# Patient Record
Sex: Male | Born: 1955 | Race: Black or African American | Hispanic: No | Smoking: Former smoker
Health system: Southern US, Community
[De-identification: ages and names within clinical notes are randomized; demographics above are authoritative.]

## PROBLEM LIST (undated history)

## (undated) DIAGNOSIS — F329 Major depressive disorder, single episode, unspecified: Secondary | ICD-10-CM

## (undated) DIAGNOSIS — M1990 Unspecified osteoarthritis, unspecified site: Secondary | ICD-10-CM

## (undated) DIAGNOSIS — J189 Pneumonia, unspecified organism: Secondary | ICD-10-CM

## (undated) DIAGNOSIS — M545 Other chronic pain: Secondary | ICD-10-CM

## (undated) DIAGNOSIS — R519 Headache, unspecified: Secondary | ICD-10-CM

## (undated) DIAGNOSIS — I700 Atherosclerosis of aorta: Secondary | ICD-10-CM

## (undated) DIAGNOSIS — R350 Frequency of micturition: Secondary | ICD-10-CM

## (undated) DIAGNOSIS — R109 Unspecified abdominal pain: Secondary | ICD-10-CM

## (undated) DIAGNOSIS — R001 Bradycardia, unspecified: Secondary | ICD-10-CM

## (undated) DIAGNOSIS — Z9289 Personal history of other medical treatment: Secondary | ICD-10-CM

## (undated) DIAGNOSIS — R9439 Abnormal result of other cardiovascular function study: Secondary | ICD-10-CM

## (undated) DIAGNOSIS — E785 Hyperlipidemia, unspecified: Secondary | ICD-10-CM

## (undated) DIAGNOSIS — M549 Dorsalgia, unspecified: Secondary | ICD-10-CM

## (undated) DIAGNOSIS — R0600 Dyspnea, unspecified: Secondary | ICD-10-CM

## (undated) DIAGNOSIS — K219 Gastro-esophageal reflux disease without esophagitis: Secondary | ICD-10-CM

## (undated) DIAGNOSIS — G4700 Insomnia, unspecified: Secondary | ICD-10-CM

## (undated) DIAGNOSIS — X58XXXA Exposure to other specified factors, initial encounter: Secondary | ICD-10-CM

## (undated) DIAGNOSIS — G8929 Other chronic pain: Secondary | ICD-10-CM

## (undated) DIAGNOSIS — F1490 Cocaine use, unspecified, uncomplicated: Secondary | ICD-10-CM

## (undated) DIAGNOSIS — I219 Acute myocardial infarction, unspecified: Secondary | ICD-10-CM

## (undated) DIAGNOSIS — I2510 Atherosclerotic heart disease of native coronary artery without angina pectoris: Secondary | ICD-10-CM

## (undated) DIAGNOSIS — R42 Dizziness and giddiness: Secondary | ICD-10-CM

## (undated) DIAGNOSIS — C3490 Malignant neoplasm of unspecified part of unspecified bronchus or lung: Secondary | ICD-10-CM

## (undated) DIAGNOSIS — F32A Depression, unspecified: Secondary | ICD-10-CM

## (undated) DIAGNOSIS — Z720 Tobacco use: Secondary | ICD-10-CM

## (undated) DIAGNOSIS — Z973 Presence of spectacles and contact lenses: Secondary | ICD-10-CM

## (undated) DIAGNOSIS — R51 Headache: Principal | ICD-10-CM

## (undated) DIAGNOSIS — K852 Alcohol induced acute pancreatitis without necrosis or infection: Secondary | ICD-10-CM

## (undated) DIAGNOSIS — F419 Anxiety disorder, unspecified: Secondary | ICD-10-CM

## (undated) HISTORY — PX: SURGERY SCROTAL / TESTICULAR: SUR1316

## (undated) HISTORY — PX: FRACTURE SURGERY: SHX138

## (undated) HISTORY — DX: Atherosclerosis of aorta: I70.0

## (undated) HISTORY — DX: Headache: R51

## (undated) HISTORY — PX: LUNG SURGERY: SHX703

## (undated) HISTORY — DX: Other chronic pain: G89.29

## (undated) HISTORY — DX: Atherosclerotic heart disease of native coronary artery without angina pectoris: I25.10

## (undated) HISTORY — DX: Bradycardia, unspecified: R00.1

## (undated) HISTORY — DX: Headache, unspecified: R51.9

## (undated) HISTORY — DX: Tobacco use: Z72.0

## (undated) HISTORY — DX: Personal history of other medical treatment: Z92.89

---

## 1978-10-05 HISTORY — PX: POSTERIOR CERVICAL FUSION/FORAMINOTOMY: SHX5038

## 2004-01-25 ENCOUNTER — Emergency Department: Payer: Self-pay | Admitting: Emergency Medicine

## 2004-02-14 ENCOUNTER — Emergency Department: Payer: Self-pay | Admitting: Emergency Medicine

## 2004-03-26 ENCOUNTER — Other Ambulatory Visit: Payer: Self-pay

## 2004-03-26 ENCOUNTER — Emergency Department: Payer: Self-pay | Admitting: General Practice

## 2004-04-01 ENCOUNTER — Ambulatory Visit: Payer: Self-pay | Admitting: Family Medicine

## 2004-09-09 ENCOUNTER — Emergency Department: Payer: Self-pay | Admitting: Emergency Medicine

## 2004-12-21 ENCOUNTER — Emergency Department: Payer: Self-pay | Admitting: Emergency Medicine

## 2005-03-22 ENCOUNTER — Emergency Department: Payer: Self-pay | Admitting: Emergency Medicine

## 2005-03-31 ENCOUNTER — Emergency Department: Payer: Self-pay | Admitting: Emergency Medicine

## 2005-11-12 ENCOUNTER — Emergency Department: Payer: Self-pay | Admitting: Emergency Medicine

## 2006-03-18 ENCOUNTER — Emergency Department: Payer: Self-pay

## 2006-06-07 ENCOUNTER — Emergency Department: Payer: Self-pay | Admitting: General Practice

## 2006-07-03 ENCOUNTER — Emergency Department: Payer: Self-pay | Admitting: Emergency Medicine

## 2006-08-16 ENCOUNTER — Emergency Department: Payer: Self-pay | Admitting: Emergency Medicine

## 2006-12-12 ENCOUNTER — Emergency Department: Payer: Self-pay | Admitting: Emergency Medicine

## 2007-11-04 ENCOUNTER — Other Ambulatory Visit: Payer: Self-pay

## 2007-11-04 ENCOUNTER — Emergency Department: Payer: Self-pay | Admitting: Emergency Medicine

## 2009-04-11 ENCOUNTER — Emergency Department: Payer: Self-pay | Admitting: Emergency Medicine

## 2009-08-07 ENCOUNTER — Inpatient Hospital Stay: Payer: Self-pay | Admitting: Internal Medicine

## 2011-04-08 ENCOUNTER — Encounter: Payer: Self-pay | Admitting: Emergency Medicine

## 2011-04-08 ENCOUNTER — Ambulatory Visit (INDEPENDENT_AMBULATORY_CARE_PROVIDER_SITE_OTHER): Payer: Self-pay | Admitting: Emergency Medicine

## 2011-04-08 DIAGNOSIS — R9389 Abnormal findings on diagnostic imaging of other specified body structures: Secondary | ICD-10-CM

## 2011-04-08 DIAGNOSIS — I499 Cardiac arrhythmia, unspecified: Secondary | ICD-10-CM

## 2011-04-08 DIAGNOSIS — R51 Headache: Secondary | ICD-10-CM

## 2011-04-08 DIAGNOSIS — R519 Headache, unspecified: Secondary | ICD-10-CM | POA: Insufficient documentation

## 2011-04-08 DIAGNOSIS — J4489 Other specified chronic obstructive pulmonary disease: Secondary | ICD-10-CM

## 2011-04-08 DIAGNOSIS — R918 Other nonspecific abnormal finding of lung field: Secondary | ICD-10-CM | POA: Insufficient documentation

## 2011-04-08 DIAGNOSIS — J439 Emphysema, unspecified: Secondary | ICD-10-CM | POA: Insufficient documentation

## 2011-04-08 DIAGNOSIS — J449 Chronic obstructive pulmonary disease, unspecified: Secondary | ICD-10-CM

## 2011-04-08 NOTE — Assessment & Plan Note (Signed)
Will concentrate on lung nodule now, then address the tobacco and COPD needs.

## 2011-04-08 NOTE — Assessment & Plan Note (Signed)
[name] nodule worrisome for CA. Scan was done in 11/12, will repeat CT and do superD cuts for possible ENB. Then discuss case and plan for either ENB or needle bx. I will call him with the plan.
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
## 2011-04-08 NOTE — Patient Instructions (Signed)
We need to repeat your CT scan of the chest to compare with your prior and to plan your biopsy.  Dr Delton Coombes will call you with the next steps.

## 2011-04-09 ENCOUNTER — Ambulatory Visit (INDEPENDENT_AMBULATORY_CARE_PROVIDER_SITE_OTHER)
Admission: RE | Admit: 2011-04-09 | Discharge: 2011-04-09 | Disposition: A | Discharge status: Home / Self Care | Payer: Self-pay | Source: Ambulatory Visit | Attending: Emergency Medicine | Admitting: Emergency Medicine

## 2011-04-09 DIAGNOSIS — R9389 Abnormal findings on diagnostic imaging of other specified body structures: Secondary | ICD-10-CM

## 2011-04-28 ENCOUNTER — Telehealth: Payer: Self-pay | Admitting: *Deleted

## 2011-04-28 DIAGNOSIS — R918 Other nonspecific abnormal finding of lung field: Secondary | ICD-10-CM

## 2011-04-28 DIAGNOSIS — R9389 Abnormal findings on diagnostic imaging of other specified body structures: Secondary | ICD-10-CM

## 2011-04-28 NOTE — Telephone Encounter (Signed)
Unable to contact pt at either phone number we have in the system. They are both out of service. Pt did not have a permanent residence when he was here for OV with Dr. Delton Coombes but confirmed his phone numbers. We may need to send him a letter if unable to reach him by phone.

## 2011-04-28 NOTE — Telephone Encounter (Signed)
Message copied by Gwynneth Albright on Mon Apr 28, 2011  8:39 AM ------      Message from: Leslye Peer      Created: Fri Apr 18, 2011  4:21 PM       Please call MR [facility] and tell him that we need to set a PET scan, then refer him to [facility] for possible surgery on his lung mass. Thanks

## 2011-05-07 NOTE — Telephone Encounter (Signed)
Pt is scheduled for PET scan on 05/08/11 and referral to thoracic surgeon sent to [facility].

## 2011-05-08 ENCOUNTER — Inpatient Hospital Stay (HOSPITAL_COMMUNITY): Admission: RE | Admit: 2011-05-08 | Payer: Self-pay | Source: Ambulatory Visit

## 2011-05-09 NOTE — Telephone Encounter (Signed)
Pt needs to be NPO after midnight. No sugar, candy or gum. Bring your morning medications with you in the original bottles. Pt will be prepped for procedure, IV will be started and nuclear medication administered. He will need to wait for approx 45 minutes after the nuclear medication is given and the scan itself is about 1 hour. Pt will need to plan on being there 2 to 2 1/2 hours total. I spoke with the pt and he is aware of all of this and verbalized understanding. When asked if he had any additional questions he said no. Will forward back to RB so he is aware.

## 2011-05-09 NOTE — Telephone Encounter (Signed)
Referral faxed to [facility] for his appt there

## 2011-05-09 NOTE — Telephone Encounter (Signed)
Almyra Free has scheduled for the pt to see Dr. Laneta Simmers on Tues., 05/13/11 @ 9am. Pt should arrive at 8:45am. Address is:  [address]. Phone is [phone]. Pt is aware of the appt but did not have pen or paper to write the information down. He promised to call me back on Mon., 05/12/11 to get the information.

## 2011-05-09 NOTE — Telephone Encounter (Signed)
We were unable to reach pt for PET scan on 05/08/11. He did callback today and is aware we are trying to set this up for him. Pt could not stay on the phone because he has a "minute" phone. He said we may reach him at [phone] to let him know when this has been rescheduled.   PET scan is now scheduled for PET scan @ [facility] on 05/16/11 @ 9:15am. Pt has been notified of this and is requesting additional information about the scan procedure itself. Pt also aware that he may be scheduled to see a thoracic surgeon as well. I will speak with Almyra Free and see what information we have regarding PET procedure and we will call the pt back.

## 2011-05-12 ENCOUNTER — Telehealth: Payer: Self-pay | Admitting: Emergency Medicine

## 2011-05-12 NOTE — Telephone Encounter (Signed)
Pt given info regarding appt with Dr Laneta Simmers.

## 2011-05-13 ENCOUNTER — Encounter: Payer: Self-pay | Admitting: Surgery

## 2011-05-13 ENCOUNTER — Other Ambulatory Visit: Payer: Self-pay | Admitting: Surgery

## 2011-05-13 ENCOUNTER — Institutional Professional Consult (permissible substitution) (INDEPENDENT_AMBULATORY_CARE_PROVIDER_SITE_OTHER): Payer: Self-pay | Admitting: Surgery

## 2011-05-13 VITALS — BP 97/72 | HR 52 | Temp 97.6°F | Resp 16 | Ht 68.0 in | Wt 128.0 lb

## 2011-05-13 DIAGNOSIS — R222 Localized swelling, mass and lump, trunk: Secondary | ICD-10-CM

## 2011-05-13 DIAGNOSIS — D381 Neoplasm of uncertain behavior of trachea, bronchus and lung: Secondary | ICD-10-CM

## 2011-05-13 DIAGNOSIS — R918 Other nonspecific abnormal finding of lung field: Secondary | ICD-10-CM | POA: Insufficient documentation
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
## 2011-05-15 ENCOUNTER — Ambulatory Visit (HOSPITAL_COMMUNITY)
Admission: RE | Admit: 2011-05-15 | Discharge: 2011-05-15 | Disposition: A | Discharge status: Home / Self Care | Payer: Self-pay | Source: Ambulatory Visit | Attending: Surgery | Admitting: Surgery

## 2011-05-15 DIAGNOSIS — D381 Neoplasm of uncertain behavior of trachea, bronchus and lung: Secondary | ICD-10-CM

## 2011-05-15 DIAGNOSIS — J984 Other disorders of lung: Secondary | ICD-10-CM | POA: Insufficient documentation

## 2011-05-15 LAB — PULMONARY FUNCTION TEST

## 2011-05-16 ENCOUNTER — Other Ambulatory Visit (HOSPITAL_COMMUNITY): Payer: Self-pay

## 2011-05-21 ENCOUNTER — Institutional Professional Consult (permissible substitution): Payer: Self-pay | Admitting: Cardiovascular Disease

## 2011-05-27 ENCOUNTER — Encounter: Payer: Self-pay | Admitting: Surgery

## 2011-05-27 ENCOUNTER — Encounter (HOSPITAL_COMMUNITY)
Admission: RE | Admit: 2011-05-27 | Discharge: 2011-05-27 | Disposition: A | Discharge status: Home / Self Care | Payer: Medicaid Other | Source: Ambulatory Visit | Attending: Emergency Medicine | Admitting: Emergency Medicine

## 2011-05-27 ENCOUNTER — Ambulatory Visit (INDEPENDENT_AMBULATORY_CARE_PROVIDER_SITE_OTHER): Payer: Self-pay | Admitting: Surgery

## 2011-05-27 VITALS — BP 113/75 | HR 57 | Temp 97.9°F | Resp 16 | Ht 68.0 in | Wt 128.0 lb

## 2011-05-27 DIAGNOSIS — R911 Solitary pulmonary nodule: Secondary | ICD-10-CM | POA: Insufficient documentation

## 2011-05-27 DIAGNOSIS — R918 Other nonspecific abnormal finding of lung field: Secondary | ICD-10-CM

## 2011-05-27 DIAGNOSIS — R9389 Abnormal findings on diagnostic imaging of other specified body structures: Secondary | ICD-10-CM

## 2011-05-27 DIAGNOSIS — R222 Localized swelling, mass and lump, trunk: Secondary | ICD-10-CM

## 2011-05-27 LAB — GLUCOSE, CAPILLARY: Glucose-Capillary: 90 mg/dL (ref 70–99)

## 2011-05-27 MED ORDER — FLUDEOXYGLUCOSE F - 18 (FDG) INJECTION
18.9000 | Freq: Once | INTRAVENOUS | Status: AC | PRN
  Administered 2011-05-27: 18.9 via INTRAVENOUS

## 2011-05-27 NOTE — Progress Notes (Signed)
[address]            [place] [address]          [phone]      HPI:  The patient returns today for discussion of his PET scan results and pulmonary function testing. He missed his appointment with cardiology but that has been rescheduled for tomorrow. His PET scan showed hypermetabolic uptake within the peripheral left upper lobe lung mass. There is a small area of hypermetabolic uptake within the pelvis which I don't think is related to the lung mass. There are no other areas of hypermetabolic activity within the chest, abdomen, or bones.  His pelvic function testing shows an FEV1 of 2.53 which was 81% of predicted. FVC was 3.86 which is 97% predicted. His diffusion capacity was markedly reduced at 31% predicted.   Current Outpatient Prescriptions  Medication Sig Dispense Refill  . buPROPion (WELLBUTRIN SR) 100 MG 12 hr tablet Take 100 mg by mouth 2 (two) times daily.      . traZODone (DESYREL) 100 MG tablet Take 100 mg by mouth at bedtime.       No current facility-administered medications for this visit.   Facility-Administered Medications Ordered in Other Visits  Medication Dose Route Frequency Provider Last Rate Last Dose  . fludeoxyglucose F - 18 (FDG) injection 18.9 milli Curie  18.9 milli Curie Intravenous Once PRN Medication Radiologist, MD   18.9 milli Curie at 05/27/11 1031     Physical Exam: BP 113/75  Pulse 57  Temp(Src) 97.9 F (36.6 C) (Oral)  Resp 16  Ht 5\' 8"  (1.727 m)  Wt 128 lb (58.06 kg)  BMI 19.46 kg/m2  SpO2 93% He looks well. Lungs are clear.  Diagnostic Tests:  NUCLEAR MEDICINE PET SKULL BASE TO THIGH   Fasting Blood Glucose:  90   Technique:  18.9 mCi F-18 FDG was injected intravenously. CT data was obtained and used for attenuation correction and anatomic localization only.  (This was not acquired as a diagnostic CT examination.) Additional exam technical data entered on technologist worksheet.     Comparison:  04/09/2011   Findings:   Neck: No hypermetabolic lymph nodes in the neck.   Chest:  Peripheral spiculated nodule within the left upper lobe measures 1.6 x 2.6 cm.  This exhibits malignant range FDG uptake within SUV max equal to 5.0, image 73.   Abdomen/Pelvis:  No abnormal hypermetabolic activity within the liver, pancreas, adrenal glands, or spleen. Within the left pelvis lateral to the left external iliac artery is a small focus of moderate to marked increased uptake.  This has an SUV max equal to 6.0.  A small lymph node is identified in this area measuring 0.8 cm.   [place]:  No focal hypermetabolic activity to suggest skeletal metastasis.   IMPRESSION:   1.  Pulmonary nodule in the left upper lobe exhibits malignant range FDG uptake and is worrisome for primary lung neoplasm. 2.  Indeterminate focus of moderate increased uptake localizing to the left external iliac lymph node chain.  It is doubtful that this represents an area of metastasis from the suspected lung neoplasm. This may be reactive in etiology.  Suggest follow-up imaging with contrast enhanced MRI or CT in 3 months.     Impression:  The PET scan shows hypermetabolic uptake within the left upper lobe lung nodule but no other areas of worrisome hypermetabolic  activity. I think his pulmonary function testing shows adequate lung function to tolerate left upper lobectomy although his diffusion capacity is low. His oxygen saturation on room air is running about 95% and I think he will tolerate lung surgery.  Plan: He will have cardiology consultation tomorrow and once he is cleared by cardiology he will either return to see me in the office or call to schedule left thoracotomy and left upper lobectomy.

## 2011-05-28 ENCOUNTER — Ambulatory Visit (INDEPENDENT_AMBULATORY_CARE_PROVIDER_SITE_OTHER): Payer: Self-pay | Admitting: Cardiovascular Disease

## 2011-05-28 ENCOUNTER — Encounter: Payer: Self-pay | Admitting: Cardiovascular Disease

## 2011-05-28 VITALS — BP 130/70 | HR 68 | Ht 67.0 in | Wt 131.0 lb

## 2011-05-28 DIAGNOSIS — Z01810 Encounter for preprocedural cardiovascular examination: Secondary | ICD-10-CM

## 2011-05-28 DIAGNOSIS — Z720 Tobacco use: Secondary | ICD-10-CM | POA: Insufficient documentation

## 2011-05-28 DIAGNOSIS — I499 Cardiac arrhythmia, unspecified: Secondary | ICD-10-CM

## 2011-05-28 DIAGNOSIS — Z87891 Personal history of nicotine dependence: Secondary | ICD-10-CM | POA: Insufficient documentation

## 2011-05-28 NOTE — Assessment & Plan Note (Signed)
[name] is here for preoperative cardiovascular evaluation for a planned left upper lobe lobectomy. Unfortunately, he complains of frequent chest pain at rest which worsens with physical activities. This has been associated with dyspnea with minimal activities. He is overall a poor historian but due to his symptoms, underlying cardiac ischemia will need to be ruled out. Thus, I recommend proceeding with a treadmill nuclear stress test for further evaluation. A treadmill stress test alone would not be sufficient due to his abnormal baseline ECG. If his stress test shows no significant ischemia, he can proceed with the planned surgery at an overall low risk.

## 2011-05-28 NOTE — Patient Instructions (Addendum)
Your physician has requested that you have an exercise stress myoview. For further information please visit [email or web address]. Please follow instruction sheet, as given.   
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
## 2011-06-02 ENCOUNTER — Ambulatory Visit (HOSPITAL_COMMUNITY): Payer: Self-pay | Attending: Cardiovascular Disease | Admitting: Radiology

## 2011-06-02 VITALS — BP 120/83 | Ht 67.0 in | Wt 134.0 lb

## 2011-06-02 DIAGNOSIS — F17200 Nicotine dependence, unspecified, uncomplicated: Secondary | ICD-10-CM | POA: Insufficient documentation

## 2011-06-02 DIAGNOSIS — I499 Cardiac arrhythmia, unspecified: Secondary | ICD-10-CM

## 2011-06-02 DIAGNOSIS — R9439 Abnormal result of other cardiovascular function study: Secondary | ICD-10-CM

## 2011-06-02 DIAGNOSIS — R0602 Shortness of breath: Secondary | ICD-10-CM | POA: Insufficient documentation

## 2011-06-02 DIAGNOSIS — Z01810 Encounter for preprocedural cardiovascular examination: Secondary | ICD-10-CM | POA: Insufficient documentation

## 2011-06-02 DIAGNOSIS — R9431 Abnormal electrocardiogram [ECG] [EKG]: Secondary | ICD-10-CM | POA: Insufficient documentation

## 2011-06-02 DIAGNOSIS — R0609 Other forms of dyspnea: Secondary | ICD-10-CM | POA: Insufficient documentation

## 2011-06-02 DIAGNOSIS — R079 Chest pain, unspecified: Secondary | ICD-10-CM | POA: Insufficient documentation

## 2011-06-02 DIAGNOSIS — R0989 Other specified symptoms and signs involving the circulatory and respiratory systems: Secondary | ICD-10-CM | POA: Insufficient documentation

## 2011-06-02 DIAGNOSIS — J438 Other emphysema: Secondary | ICD-10-CM | POA: Insufficient documentation

## 2011-06-02 HISTORY — DX: Abnormal result of other cardiovascular function study: R94.39

## 2011-06-02 MED ORDER — REGADENOSON 0.4 MG/5ML IV SOLN
0.4000 mg | Freq: Once | INTRAVENOUS | Status: AC
  Administered 2011-06-02: 0.4 mg via INTRAVENOUS

## 2011-06-02 MED ORDER — TECHNETIUM TC 99M TETROFOSMIN IV KIT
10.0000 | PACK | Freq: Once | INTRAVENOUS | Status: AC | PRN
  Administered 2011-06-02: 10 via INTRAVENOUS

## 2011-06-02 MED ORDER — TECHNETIUM TC 99M TETROFOSMIN IV KIT
30.0000 | PACK | Freq: Once | INTRAVENOUS | Status: AC | PRN
  Administered 2011-06-02: 30 via INTRAVENOUS

## 2011-06-02 NOTE — Progress Notes (Signed)
[facility] SITE 3 NUCLEAR MED [address] [place] [address] [phone]  Cardiology Nuclear Med Study  [name] is a 56 y.o. male     MRN : [id number]     DOB: 1955/09/10  Procedure Date: 06/02/2011  Nuclear Med Background Indication for Stress Test:  Evaluation for Ischemia and Surgical Clearance:Pending Left lung lobectomy History:  Emphysema( per CT), Abnormal EKG, 9/12 ECHO: EF: 65%  Cardiac Risk Factors: Smoker  Symptoms:  Chest Pain, DOE and SOB   Nuclear Pre-Procedure Caffeine/Decaff Intake:  11:00pm NPO After: 11:00pm   Lungs:  clear O2 Sat: 96% on room air. IV 0.9% NS with Angio Cath:  22g  IV Site: R Antecubital  IV Started by:  Milana Na, EMT-P  Chest Size (in):  38 Cup Size: n/a  Height: 5\' 7"  (1.702 m)  Weight:  134 lb (60.782 kg)  BMI:  Body mass index is 20.99 kg/(m^2). Tech Comments:  n/a    Nuclear Med Study 1 or 2 day study: 1 day  Stress Test Type:  Treadmill/Lexiscan  Reading MD: Olga Millers, MD  Order Authorizing Provider:  Jerolyn Center Arida,MD  Resting Radionuclide: Technetium 34m Tetrofosmin  Resting Radionuclide Dose: 11.0 mCi   Stress Radionuclide:  Technetium 8m Tetrofosmin  Stress Radionuclide Dose: 33.0 mCi           Stress Protocol Rest HR: 45 Stress HR: 93  Rest BP: 120/83 Stress BP: 144/91  Exercise Time (min): 4:24 METS: 4.6   Predicted Max HR: 164 bpm % Max HR: 56.71 bpm Rate Pressure Product: 21308   Dose of Adenosine (mg):  n/a Dose of Lexiscan: 0.4 mg  Dose of Atropine (mg): n/a Dose of Dobutamine: n/a mcg/kg/min (at max HR)  Stress Test Technologist: Milana Na, EMT-P  Nuclear Technologist:  Domenic Polite, CNMT     Rest Procedure:  Myocardial perfusion imaging was performed at rest 45 minutes following the intravenous administration of Technetium 61m Tetrofosmin. Rest ECG: Sinus Bradycardia  Stress Procedure:  The patient received IV Lexiscan 0.4 mg over 15-seconds with concurrent  low level exercise and then Technetium 39m Tetrofosmin was injected at 30-seconds while the patient continued walking one more minute. There were no significant changes, + sob, and rare pacs with Lexiscan. Quantitative spect images were obtained after a 45-minute delay. Stress ECG: No significant change from baseline ECG  QPS Raw Data Images:  There is interference from nuclear activity from structures below the diaphragm. This does not affect the ability to read the study. Stress Images:  There is decreased uptake in the inferior wall. Rest Images:  There is decreased uptake in the inferior wall, less prominent compared to the stress images. Subtraction (SDS):  These findings are consistent with prior inferior infarct and very mild peri-infarct ischemia. Transient Ischemic Dilatation (Normal <1.22):  1.13 Lung/Heart Ratio (Normal <0.45):  0.29  Quantitative Gated Spect Images QGS EDV:  139 ml QGS ESV:  67 ml  Impression Exercise Capacity:  Lexiscan with low level exercise. BP Response:  Normal blood pressure response. Clinical Symptoms:  There is dyspnea. ECG Impression:  No significant ST segment change suggestive of ischemia. Comparison with Prior Nuclear Study: No images to compare  Overall Impression:  Abnormal stress nuclear study with a medium, severe, partially reversible inferior defect consistent with prior inferior infarct and very mild peri-infarct ischemia.  LV Ejection Fraction: 52%.  LV Wall Motion:  NL LV Function; NL Wall Motion; mild LVE.   Olga Millers

## 2011-06-03 ENCOUNTER — Telehealth: Payer: Self-pay

## 2011-06-03 ENCOUNTER — Telehealth: Payer: Self-pay | Admitting: Cardiology

## 2011-06-03 ENCOUNTER — Encounter (HOSPITAL_COMMUNITY): Payer: Self-pay | Admitting: Pharmacy Technician

## 2011-06-03 NOTE — Telephone Encounter (Signed)
Cath was scheduled at the Main Cath lab ([facility] lab unable to do cath) on 06/04/11 at 1:00pm.  Pt was notified to be there no later than 11:00am.  He was given instructions verbally since the letter would not reach him in time.  Dr Kirke Corin was notified of date and time of cath.

## 2011-06-03 NOTE — Telephone Encounter (Signed)
The patient had a nuclear treadmill test in [place] yesterday which I see is abnormal. The patient called [facility] and we see patient needs to have cardiac cath with Dr. Jens Som. He was told has a spot on his lung and needs to have surgery but was told needs to have further testing due to abnormal nuclear treadmill test. Please contact patient he is very upset not mad but upset regarding his surgery not being able to take place.

## 2011-06-03 NOTE — Telephone Encounter (Signed)
PT CALLING RE FURTHER TESTS HE WAS TOLD HE NEEDED, PLS CALL [phone]

## 2011-06-04 ENCOUNTER — Ambulatory Visit (HOSPITAL_COMMUNITY)
Admission: RE | Admit: 2011-06-04 | Discharge: 2011-06-05 | Disposition: A | Discharge status: Home / Self Care | Payer: Medicaid Other | Source: Ambulatory Visit | Attending: Cardiovascular Disease | Admitting: Cardiovascular Disease

## 2011-06-04 ENCOUNTER — Encounter (HOSPITAL_COMMUNITY)
Admission: RE | Disposition: A | Discharge status: Home / Self Care | Payer: Self-pay | Source: Ambulatory Visit | Attending: Cardiovascular Disease

## 2011-06-04 ENCOUNTER — Encounter (HOSPITAL_COMMUNITY): Payer: Self-pay | Admitting: General Practice

## 2011-06-04 DIAGNOSIS — R079 Chest pain, unspecified: Secondary | ICD-10-CM

## 2011-06-04 DIAGNOSIS — I2510 Atherosclerotic heart disease of native coronary artery without angina pectoris: Secondary | ICD-10-CM

## 2011-06-04 DIAGNOSIS — C3490 Malignant neoplasm of unspecified part of unspecified bronchus or lung: Secondary | ICD-10-CM

## 2011-06-04 HISTORY — DX: Other chronic pain: G89.29

## 2011-06-04 HISTORY — DX: Depression, unspecified: F32.A

## 2011-06-04 HISTORY — DX: Other chronic pain: M54.50

## 2011-06-04 HISTORY — DX: Unspecified osteoarthritis, unspecified site: M19.90

## 2011-06-04 HISTORY — DX: Malignant neoplasm of unspecified part of unspecified bronchus or lung: C34.90

## 2011-06-04 HISTORY — DX: Low back pain: M54.5

## 2011-06-04 HISTORY — DX: Major depressive disorder, single episode, unspecified: F32.9

## 2011-06-04 HISTORY — DX: Alcohol induced acute pancreatitis without necrosis or infection: K85.20

## 2011-06-04 HISTORY — DX: Pneumonia, unspecified organism: J18.9

## 2011-06-04 HISTORY — PX: CARDIAC CATHETERIZATION: SHX172

## 2011-06-04 HISTORY — PX: LEFT HEART CATHETERIZATION WITH CORONARY ANGIOGRAM: SHX5451

## 2011-06-04 HISTORY — DX: Abnormal result of other cardiovascular function study: R94.39

## 2011-06-04 LAB — PROTIME-INR
INR: 1.08 (ref 0.00–1.49)
Prothrombin Time: 14.2 seconds (ref 11.6–15.2)

## 2011-06-04 LAB — CBC
HCT: 43.9 % (ref 39.0–52.0)
Hemoglobin: 15.2 g/dL (ref 13.0–17.0)
MCH: 32.6 pg (ref 26.0–34.0)
MCHC: 34.6 g/dL (ref 30.0–36.0)
MCV: 94.2 fL (ref 78.0–100.0)
Platelets: 243 10*3/uL (ref 150–400)
RBC: 4.66 MIL/uL (ref 4.22–5.81)
RDW: 14.7 % (ref 11.5–15.5)
WBC: 5.8 10*3/uL (ref 4.0–10.5)

## 2011-06-04 LAB — BASIC METABOLIC PANEL
BUN: 14 mg/dL (ref 6–23)
CO2: 26 mEq/L (ref 19–32)
Calcium: 9.2 mg/dL (ref 8.4–10.5)
Chloride: 103 mEq/L (ref 96–112)
Creatinine, Ser: 1.17 mg/dL (ref 0.50–1.35)
GFR calc Af Amer: 79 mL/min — ABNORMAL LOW (ref >90)
GFR calc non Af Amer: 68 mL/min — ABNORMAL LOW (ref >90)
Glucose, Bld: 111 mg/dL — ABNORMAL HIGH (ref 70–99)
Potassium: 4 mEq/L (ref 3.5–5.1)
Sodium: 137 mEq/L (ref 135–145)

## 2011-06-04 SURGERY — LEFT HEART CATHETERIZATION WITH CORONARY ANGIOGRAM
Anesthesia: LOCAL

## 2011-06-04 MED ORDER — TRAZODONE HCL 100 MG PO TABS
100.0000 mg | ORAL_TABLET | Freq: Every day | ORAL | Status: DC
  Administered 2011-06-04: 100 mg via ORAL
  Filled 2011-06-04 (×2): qty 1

## 2011-06-04 MED ORDER — ONDANSETRON HCL 4 MG/2ML IJ SOLN: 4.0000 mg | Freq: Four times a day (QID) | INTRAMUSCULAR | Status: DC | PRN

## 2011-06-04 MED ORDER — NITROGLYCERIN 0.4 MG SL SUBL: 0.4000 mg | SUBLINGUAL_TABLET | SUBLINGUAL | Status: DC | PRN

## 2011-06-04 MED ORDER — ACETAMINOPHEN 325 MG PO TABS: 650.0000 mg | ORAL_TABLET | ORAL | Status: DC | PRN

## 2011-06-04 MED ORDER — SODIUM CHLORIDE 0.9 % IV SOLN
INTRAVENOUS | Status: AC
  Administered 2011-06-04: 75 mL/h via INTRAVENOUS

## 2011-06-04 MED ORDER — ASPIRIN EC 81 MG PO TBEC
81.0000 mg | DELAYED_RELEASE_TABLET | Freq: Every day | ORAL | Status: DC
  Filled 2011-06-04: qty 1

## 2011-06-04 MED ORDER — FENTANYL CITRATE 0.05 MG/ML IJ SOLN
INTRAMUSCULAR | Status: AC
  Filled 2011-06-04: qty 2

## 2011-06-04 MED ORDER — MIDAZOLAM HCL 2 MG/2ML IJ SOLN
INTRAMUSCULAR | Status: AC
  Filled 2011-06-04: qty 2

## 2011-06-04 MED ORDER — ASPIRIN 81 MG PO CHEW
81.0000 mg | CHEWABLE_TABLET | Freq: Every day | ORAL | Status: DC
  Administered 2011-06-05: 81 mg via ORAL
  Filled 2011-06-04: qty 1

## 2011-06-04 MED ORDER — SODIUM CHLORIDE 0.9 % IJ SOLN: 3.0000 mL | INTRAMUSCULAR | Status: DC | PRN

## 2011-06-04 MED ORDER — ASPIRIN 300 MG RE SUPP
300.0000 mg | RECTAL | Status: DC
  Filled 2011-06-04: qty 1

## 2011-06-04 MED ORDER — SODIUM CHLORIDE 0.9 % IJ SOLN: 3.0000 mL | Freq: Two times a day (BID) | INTRAMUSCULAR | Status: DC

## 2011-06-04 MED ORDER — ASPIRIN 81 MG PO CHEW
324.0000 mg | CHEWABLE_TABLET | ORAL | Status: AC
  Administered 2011-06-04: 324 mg via ORAL
  Filled 2011-06-04: qty 4

## 2011-06-04 MED ORDER — SODIUM CHLORIDE 0.9 % IV SOLN
INTRAVENOUS | Status: DC
  Administered 2011-06-04: 11:00:00 via INTRAVENOUS

## 2011-06-04 MED ORDER — ATORVASTATIN CALCIUM 10 MG PO TABS
10.0000 mg | ORAL_TABLET | Freq: Every day | ORAL | Status: DC
  Administered 2011-06-04: 10 mg via ORAL
  Filled 2011-06-04 (×2): qty 1

## 2011-06-04 MED ORDER — BUPROPION HCL ER (SR) 100 MG PO TB12
100.0000 mg | ORAL_TABLET | Freq: Two times a day (BID) | ORAL | Status: DC
  Administered 2011-06-04 – 2011-06-05 (×2): 100 mg via ORAL
  Filled 2011-06-04 (×4): qty 1

## 2011-06-04 MED ORDER — ASPIRIN 81 MG PO CHEW: 324.0000 mg | CHEWABLE_TABLET | ORAL | Status: DC

## 2011-06-04 MED ORDER — SODIUM CHLORIDE 0.9 % IV SOLN: 250.0000 mL | INTRAVENOUS | Status: DC | PRN

## 2011-06-04 NOTE — CV Procedure (Signed)
    Cardiac Catheterization Operative Report  Sequoyah Counterman [id number] 5/1/20132:03 PM No primary provider on file.  Procedure Performed:  1. Left Heart Catheterization 2. Selective Coronary Angiography 3. Left ventricular angiogram  Operator: Verne Carrow, MD  Arterial access site:  Right radial artery.   Indication:  Chest pain, abnormal stress myoview with possible inferior wall scar with peri-infarct ischemia. Pt has lung cancer and will need lobectomy.                                  Procedure Details: The risks, benefits, complications, treatment options, and expected outcomes were discussed with the patient. The patient and/or family concurred with the proposed plan, giving informed consent. The patient was brought to the cath lab after IV hydration was begun and oral premedication was given. The patient was further sedated with Versed and Fentanyl. The right wrist was assessed with an Allens test which was positive. The right wrist was prepped and draped in a sterile fashion. 1% lidocaine was used for local anesthesia. Using the modified Seldinger access technique, a 5 French sheath was placed in the right radial artery. 1.25 mg Nicardipine was given through the sheath. 3500 units IV heparin was given. Standard diagnostic catheters were used to perform selective coronary angiography. A pigtail catheter was used to perform a left ventricular angiogram. The sheath was removed from the right radial artery and a Terumo hemostasis band was applied at the arteriotomy site on the right wrist.    There were no immediate complications. The patient was taken to the recovery area in stable condition.   Hemodynamic Findings: Central aortic pressure: 100/54 Left ventricular pressure: 101/4/8  Angiographic Findings:  Left main:  No obstructive disease.   Left Anterior Descending Artery: Large caliber vessel that courses the apex. The proximal vessel has 20% plaque. There are two  very small caliber diagonal branches with no obstructive disease.   Circumflex Artery: Moderate sized vessel with early intermediate branch and moderate sized OM branch without obstructive disease. 20% plaque in the mid AV groove Circumflex just beyond the marginal branch.   Right Coronary Artery: Moderate sized dominant vessel with 20% mid plaque.   Left Ventricular Angiogram: LVEF=65%.   Impression: 1. Mild non-obstructive CAD 2. Normal LV systolic function  Recommendations: He will need long term management with a statin. No further workup before his planned lobectomy. He will be admitted overnight since he lives alone and has no ride.        Complications:  None. The patient tolerated the procedure well.
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
## 2011-06-04 NOTE — Interval H&P Note (Signed)
History and Physical Interval Note:  06/04/2011 1:32 PM  Early Chars  has presented today for cardiac cath  with the diagnosis of Chest pain  The various methods of treatment have been discussed with the patient and family. After consideration of risks, benefits and other options for treatment, the patient has consented to  Procedure(s) (LRB): LEFT HEART CATHETERIZATION WITH CORONARY ANGIOGRAM (N/A) as a surgical intervention .  The patients' history has been reviewed, patient examined, no change in status, stable for surgery.  I have reviewed the patients' chart and labs.  Questions were answered to the patient's satisfaction.     Alleen Kehm

## 2011-06-05 DIAGNOSIS — I2510 Atherosclerotic heart disease of native coronary artery without angina pectoris: Secondary | ICD-10-CM

## 2011-06-05 LAB — BASIC METABOLIC PANEL
BUN: 14 mg/dL (ref 6–23)
CO2: 25 mEq/L (ref 19–32)
Calcium: 8.5 mg/dL (ref 8.4–10.5)
Chloride: 108 mEq/L (ref 96–112)
Creatinine, Ser: 1.01 mg/dL (ref 0.50–1.35)
GFR calc Af Amer: >90 mL/min (ref >90)
GFR calc non Af Amer: 81 mL/min — ABNORMAL LOW (ref >90)
Glucose, Bld: 70 mg/dL (ref 70–99)
Potassium: 3.9 mEq/L (ref 3.5–5.1)
Sodium: 140 mEq/L (ref 135–145)

## 2011-06-05 LAB — HEPATIC FUNCTION PANEL
ALT: 14 U/L (ref 0–53)
AST: 14 U/L (ref 0–37)
Albumin: 3 g/dL — ABNORMAL LOW (ref 3.5–5.2)
Alkaline Phosphatase: 80 U/L (ref 39–117)
Bilirubin, Direct: <0.1 mg/dL (ref 0.0–0.3)
Total Bilirubin: 0.4 mg/dL (ref 0.3–1.2)
Total Protein: 5.6 g/dL — ABNORMAL LOW (ref 6.0–8.3)

## 2011-06-05 LAB — LIPID PANEL
Cholesterol: 141 mg/dL (ref 0–200)
HDL: 59 mg/dL (ref >39)
LDL Cholesterol: 66 mg/dL (ref 0–99)
Total CHOL/HDL Ratio: 2.4 RATIO
Triglycerides: 79 mg/dL (ref <150)
VLDL: 16 mg/dL (ref 0–40)

## 2011-06-05 LAB — CBC
HCT: 40.5 % (ref 39.0–52.0)
Hemoglobin: 13.5 g/dL (ref 13.0–17.0)
MCH: 31.6 pg (ref 26.0–34.0)
MCHC: 33.3 g/dL (ref 30.0–36.0)
MCV: 94.8 fL (ref 78.0–100.0)
Platelets: 215 10*3/uL (ref 150–400)
RBC: 4.27 MIL/uL (ref 4.22–5.81)
RDW: 14.5 % (ref 11.5–15.5)
WBC: 4.8 10*3/uL (ref 4.0–10.5)

## 2011-06-05 MED ORDER — ASPIRIN 81 MG PO CHEW: 81.0000 mg | CHEWABLE_TABLET | Freq: Every day | ORAL | Status: AC

## 2011-06-05 MED ORDER — PRAVASTATIN SODIUM 40 MG PO TABS: 40.0000 mg | ORAL_TABLET | Freq: Every day | ORAL | Status: DC

## 2011-06-05 MED ORDER — ATORVASTATIN CALCIUM 10 MG PO TABS: 10.0000 mg | ORAL_TABLET | Freq: Every day | ORAL | Status: DC

## 2011-06-05 MED FILL — Nicardipine HCl IV Soln 2.5 MG/ML: INTRAVENOUS | Qty: 1 | Status: AC

## 2011-06-05 NOTE — Progress Notes (Signed)
Chaplain's Note:  12:20 visited pt room per consult request.  Pt sitting on bed and dressed, ready for d/c.  Offered pastoral presence and listening.  Pt discussing life and situation, seeking support financially.  Re-affirmed the items provided through csw, and that I had no monies to offer him.  Offered to pray for pt, and for future dealing with cancer.  Pt accepted.  Held pt hand and prayed.  Pt thanked me for visit.  No follow-up needed due to d/c.

## 2011-06-05 NOTE — Discharge Summary (Signed)
Full note this am. cdm 

## 2011-06-05 NOTE — Progress Notes (Signed)
    SUBJECTIVE: No events overnight. NO chest pain or SOB.   BP 106/65  Pulse 41  Temp(Src) 97.4 F (36.3 C) (Oral)  Resp 18  Ht 5\' 7"  (1.702 m)  Wt 135 lb (61.236 kg)  BMI 21.14 kg/m2  SpO2 91%   PHYSICAL EXAM General: Well developed, well nourished, in no acute distress. Alert and oriented x 3.  Psych:  Good affect, responds appropriately Neck: No JVD. No masses noted.  Lungs: Clear bilaterally with no wheezes or rhonci noted.  Heart: Bradycardic with no murmurs noted. Abdomen: Bowel sounds are present. Soft, non-tender.  Extremities: No lower extremity edema. Right wrist cath site ok.   LABS: Basic Metabolic Panel:  Basename 06/05/11 0450 06/04/11 1136  NA 140 137  K 3.9 4.0  CL 108 103  CO2 25 26  GLUCOSE 70 111*  BUN 14 14  CREATININE 1.01 1.17  CALCIUM 8.5 9.2  MG -- --  PHOS -- --   CBC:  Basename 06/05/11 0450 06/04/11 1052  WBC 4.8 5.8  NEUTROABS -- --  HGB 13.5 15.2  HCT 40.5 43.9  MCV 94.8 94.2  PLT 215 243   Fasting Lipid Panel:  Basename 06/05/11 0450  CHOL 141  HDL 59  LDLCALC 66  TRIG 79  CHOLHDL 2.4  LDLDIRECT --    Current Meds:    . aspirin  324 mg Oral Pre-Cath  . aspirin  324 mg Oral NOW   Or  . aspirin  300 mg Rectal NOW  . aspirin  81 mg Oral Daily  . aspirin EC  81 mg Oral Daily  . atorvastatin  10 mg Oral q1800  . buPROPion  100 mg Oral BID  . fentaNYL      . midazolam      . traZODone  100 mg Oral QHS  . DISCONTD: sodium chloride  3 mL Intravenous Q12H     ASSESSMENT AND PLAN:  1. CAD: Pt was brought in yesterday for an outpatient cath after he had an abnormal stress myoview in workup of chest pain. He was seen in the clinic by Dr. Kirke Corin as a pre-operative consult before left upper lobectomy by Dr. Laneta Simmers. Cardiac cath yesterday with mild CAD. Admitted overnight because he lives alone and had no ride after the cath so we could not safely allow him to go home. Doing well this am. Started on low dose statin.   2.  Bradycardia: Sinus. Asymptomatic. No rate limiting agents.   3. Dispo: d/c home this am. Keep his f/u as planned with Dr. Kirke Corin. I have forwarded my cath note to Dr. Laneta Simmers.  He will need Social Work assistance with transportation home.   Dawnn Nam  5/2/20137:28 AM

## 2011-06-05 NOTE — Progress Notes (Addendum)
Clinical Social Work Department BRIEF PSYCHOSOCIAL ASSESSMENT 06/05/2011  Patient:  [name], [name]     Account Number:  [id number]     Admit date:  06/04/2011  Clinical Social Worker:  Hulan Fray  Date/Time:  06/05/2011 09:16 AM  Referred by:  RN  Date Referred:  06/04/2011 Referred for  Homelessness   Other Referral:   Interview type:  Patient Other interview type:    PSYCHOSOCIAL DATA Living Status:  OTHER Admitted from facility:   Level of care:   Primary support name:  Shirlean Kelly Primary support relationship to patient:   Degree of support available:   adequate    CURRENT CONCERNS Current Concerns  Other - See comment   Other Concerns:   Needs transportation and homelessness    SOCIAL WORK ASSESSMENT / PLAN Clinical Social Worker received referral for homelessness issues. Patient states that he had previously stayed at the [facility] and completed his number of days he is allowed to stay. Patient stated that he will be able to return to his Neice's house, Elnita Maxwell and her husband Ronnald Nian once discharged from hospital. Patient stated that his neice will be moving to [facility] and was concerned about living arrangements after. Patient did state that he uses the [facility] daily for bus passes. CSW provided patient with a list of shelters and crisis numbers and places that can provide hot meals. Patient stated that he needed assistance with transportation and CSW will provide him with two bus passes.   Assessment/plan status:  No Further Intervention Required Other assessment/ plan:   Information/referral to community resources:   List of [facility] and food banks    PATIENT'S/FAMILY'S RESPONSE TO PLAN OF CARE: Patient was appreciative of CSW's assistance with transportation and resources for shelters.
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
## 2011-06-05 NOTE — Progress Notes (Signed)
Pt d/c home. D/c instructions reviewed with pt. Pt verbalized understanding. Pt given copy of instructions, meds from pharmacy filled with assist of CM and pharmacy. Pt had requested a chaplain to see before d/c, chaplain did come to see pt prior to him leaving. Pt also provided bus passes through SW,  Given to pt, pt taking bus home, pt escorted to short stay entrance with NT escort.

## 2011-06-05 NOTE — Consult Note (Signed)
Pt smokes 1 ppd. He is in action stage and wants to quit. Says he needs help with NRT's to help him quit. Recommended 21 patch to start with. Discussed patch use instructions and how to taper down. Pt voices understanding. Referred to 1-800 quit now for f/u and support. Discussed oral fixation substitutes, second hand smoke and in home smoking policy. Reviewed and gave pt Written education/contact information.

## 2011-06-05 NOTE — Progress Notes (Addendum)
   CARE MANAGEMENT NOTE 06/05/2011  Patient:  [name], [name]   Account Number:  [id number]  Date Initiated:  06/05/2011  Documentation initiated by:  GRAVES-BIGELOW,Wood Novacek  Subjective/Objective Assessment:   Pt admitted with - Abnormal stress myoview with possible inferior wall scar with peri-infarct ischemia. Cardiac cath 06/04/11 - mild non-obstructive CAD, EF 65%. Plan for home today. Pt states he uses [facility] for resources.     Action/Plan:   Nurse Dottie assists pt with medicaitons if needed. Pt states he has an orange card that was just issued, however he has not used it yet. CM did contact PA and she will write Rx for cheaper statin.   Anticipated DC Date:  06/05/2011   Anticipated DC Plan:  HOME/SELF CARE  In-house referral  Clinical Social Worker      DC Planning Services  CM consult      Choice offered to / List presented to:             Status of service:  Completed, signed off Medicare Important Message given?   (If response is "NO", the following Medicare IM given date fields will be blank) Date Medicare IM given:   Date Additional Medicare IM given:    Discharge Disposition:  HOME/SELF CARE  Per UR Regulation:    If discussed at Long Length of Stay Meetings, dates discussed:    Comments:  06-05-11 0953 Tomi Bamberger, RN,BSN [phone] Pt states he lives with [facility] right now, however family plans on moving to [place] this weekend. CSW provided pt with resources for shelters.  CM will supply pt with 3 day supply of medications. CM did make him aware of the other resources that are at the [facility] like showers and haircuts on Mondays, computers that help with finding employment. No other needs at this time for CM.

## 2011-06-06 ENCOUNTER — Other Ambulatory Visit: Payer: Self-pay

## 2011-06-06 ENCOUNTER — Encounter (HOSPITAL_COMMUNITY): Payer: Self-pay | Admitting: Pharmacy Technician

## 2011-06-06 DIAGNOSIS — D381 Neoplasm of uncertain behavior of trachea, bronchus and lung: Secondary | ICD-10-CM

## 2011-06-10 ENCOUNTER — Encounter (HOSPITAL_COMMUNITY)
Admission: RE | Admit: 2011-06-10 | Discharge: 2011-06-10 | Disposition: A | Discharge status: Home / Self Care | Payer: Medicaid Other | Source: Ambulatory Visit | Attending: Surgery | Admitting: Surgery

## 2011-06-10 ENCOUNTER — Encounter (HOSPITAL_COMMUNITY): Payer: Self-pay

## 2011-06-10 VITALS — BP 123/85 | HR 51 | Temp 97.9°F | Resp 20 | Ht 67.0 in | Wt 130.8 lb

## 2011-06-10 DIAGNOSIS — D381 Neoplasm of uncertain behavior of trachea, bronchus and lung: Secondary | ICD-10-CM

## 2011-06-10 HISTORY — DX: Gastro-esophageal reflux disease without esophagitis: K21.9

## 2011-06-10 HISTORY — DX: Hyperlipidemia, unspecified: E78.5

## 2011-06-10 HISTORY — DX: Dorsalgia, unspecified: M54.9

## 2011-06-10 HISTORY — DX: Dizziness and giddiness: R42

## 2011-06-10 HISTORY — DX: Insomnia, unspecified: G47.00

## 2011-06-10 HISTORY — DX: Frequency of micturition: R35.0

## 2011-06-10 LAB — URINE MICROSCOPIC-ADD ON

## 2011-06-10 LAB — COMPREHENSIVE METABOLIC PANEL
ALT: 16 U/L (ref 0–53)
AST: 17 U/L (ref 0–37)
Albumin: 3.7 g/dL (ref 3.5–5.2)
Alkaline Phosphatase: 103 U/L (ref 39–117)
BUN: 15 mg/dL (ref 6–23)
CO2: 26 mEq/L (ref 19–32)
Calcium: 9.3 mg/dL (ref 8.4–10.5)
Chloride: 106 mEq/L (ref 96–112)
Creatinine, Ser: 1.07 mg/dL (ref 0.50–1.35)
GFR calc Af Amer: 88 mL/min — ABNORMAL LOW (ref >90)
GFR calc non Af Amer: 76 mL/min — ABNORMAL LOW (ref >90)
Glucose, Bld: 83 mg/dL (ref 70–99)
Potassium: 4.2 mEq/L (ref 3.5–5.1)
Sodium: 139 mEq/L (ref 135–145)
Total Bilirubin: 0.5 mg/dL (ref 0.3–1.2)
Total Protein: 6.8 g/dL (ref 6.0–8.3)

## 2011-06-10 LAB — URINALYSIS, ROUTINE W REFLEX MICROSCOPIC
Bilirubin Urine: NEGATIVE
Glucose, UA: NEGATIVE mg/dL
Hgb urine dipstick: NEGATIVE
Ketones, ur: NEGATIVE mg/dL
Nitrite: NEGATIVE
Protein, ur: NEGATIVE mg/dL
Specific Gravity, Urine: 1.018 (ref 1.005–1.030)
Urobilinogen, UA: 1 mg/dL (ref 0.0–1.0)
pH: 7 (ref 5.0–8.0)

## 2011-06-10 LAB — TYPE AND SCREEN
ABO/RH(D): O POS
Antibody Screen: NEGATIVE

## 2011-06-10 LAB — BLOOD GAS, ARTERIAL
Acid-base deficit: 0.2 mmol/L (ref 0.0–2.0)
Bicarbonate: 23.2 mEq/L (ref 20.0–24.0)
Drawn by: 344381
FIO2: 0.21 %
O2 Saturation: 96.3 %
Patient temperature: 98.6
TCO2: 24.2 mmol/L (ref 0–100)
pCO2 arterial: 33.6 mmHg — ABNORMAL LOW (ref 35.0–45.0)
pH, Arterial: 7.454 — ABNORMAL HIGH (ref 7.350–7.450)
pO2, Arterial: 77.4 mmHg — ABNORMAL LOW (ref 80.0–100.0)

## 2011-06-10 LAB — APTT: aPTT: 30 seconds (ref 24–37)

## 2011-06-10 LAB — ABO/RH: ABO/RH(D): O POS

## 2011-06-10 LAB — SURGICAL PCR SCREEN
MRSA, PCR: NEGATIVE
Staphylococcus aureus: NEGATIVE

## 2011-06-10 NOTE — Pre-Procedure Instructions (Signed)
20 [name]  06/10/2011   Your procedure is scheduled on:  Thurs, May 9 @  7:30 AM  Report to [facility] at 5:30 AM.  Call this number if you have problems the morning of surgery: [phone]   Remember:   Do not eat food:After Midnight.  May have clear liquids: up to 4 Hours before arrival.(until 1:30 am)  Clear liquids include soda, tea, black coffee, apple or grape juice, broth,water  Take these medicines the morning of surgery with A SIP OF WATER: Wellbutrin   Do not wear jewelry  Do not wear lotions, powders, or perfumes.  Do not bring valuables to the hospital.  Contacts, dentures or bridgework may not be worn into surgery.  Leave suitcase in the car. After surgery it may be brought to your room.  For patients admitted to the hospital, checkout time is 11:00 AM the day of discharge.   Special Instructions: CHG Shower Use Special Wash: 1/2 bottle night before surgery and 1/2 bottle morning of surgery.   Please read over the following fact sheets that you were given: Pain Booklet, Coughing and Deep Breathing, MRSA Information and Surgical Site Infection Prevention

## 2011-06-10 NOTE — Progress Notes (Signed)
Dr.arida is cardiologist with office visit in epic from 05/28/11  Stress test was done on 06/02/11 and didn't look just right per pt so heart cath was done Echo reports in epic from 2012/2012 Heart cath report in epic  Medical Md is at [facility]

## 2011-06-10 NOTE — Progress Notes (Signed)
Pt states that his niece is moving and after surgery he will be homeless;questioning if its worth even having the surgery d/t his age;also wants to just stopped taking his Wellbutrin-explained to him that you just can't stop something without being weaned off and medical MD knowing.Pt verbalized understanding;spoke with Revonda Standard at [facility] office and notified her of the above info

## 2011-06-11 MED ORDER — DEXTROSE 5 % IV SOLN
1.5000 g | INTRAVENOUS | Status: AC
  Administered 2011-06-12: 1.5 g via INTRAVENOUS
  Filled 2011-06-11: qty 1.5

## 2011-06-11 NOTE — Consult Note (Signed)
Anesthesia Chart Review:  Patient is a 56 year old male scheduled for left thoracotomy for LU lobectomy on 06/12/11 for a [name] lung mass.  Pulmonologist is Dr. Delton Coombes.  Cardiologist (for preoperative evaluation) is Dr. Kirke Corin.    EKG on 05/28/11 showed NSR, PACs, LAFB, possible LVH.  He had an bnormal stress myoview on 06/02/11 showing possible inferior wall scar with peri-infarct ischemia, EF 52%.  He subsequently had a cardiac cath on 06/04/11 showing mild non-obstructive CAD, EF 65%   Echo on 10/15/10 [facility]) showed mild LVH, normal LVEF 65%, trace MR, IVC dilated, trace TR, RVSP 17.2 mm HG.  CXR on 06/10/11 showed: 1. Left upper lobe pulmonary nodule  2. No acute infiltrate or evidence of CHF.  His pelvic function testing shows an FEV1 of 2.53 which was 81% of predicted. FVC was 3.86 which is 97% predicted. His diffusion capacity was markedly reduced at 31% predicted.   Labs from 06/04/11-06/10/11 reviewed.  Plan to proceed.  Shonna Chock, PA-C
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
## 2011-06-12 ENCOUNTER — Encounter (HOSPITAL_COMMUNITY)
Admission: RE | Disposition: A | Discharge status: Home / Self Care | Payer: Self-pay | Source: Ambulatory Visit | Attending: Surgery

## 2011-06-12 ENCOUNTER — Ambulatory Visit (HOSPITAL_COMMUNITY): Payer: Medicaid Other | Admitting: Vascular Surgery

## 2011-06-12 ENCOUNTER — Inpatient Hospital Stay (HOSPITAL_COMMUNITY): Payer: Medicaid Other

## 2011-06-12 ENCOUNTER — Encounter (HOSPITAL_COMMUNITY): Payer: Self-pay | Admitting: Vascular Surgery

## 2011-06-12 ENCOUNTER — Encounter (HOSPITAL_COMMUNITY): Payer: Self-pay | Admitting: *Deleted

## 2011-06-12 ENCOUNTER — Inpatient Hospital Stay (HOSPITAL_COMMUNITY)
Admission: RE | Admit: 2011-06-12 | Discharge: 2011-06-20 | DRG: 165 | Disposition: A | Discharge status: Home / Self Care | Payer: Medicaid Other | Source: Ambulatory Visit | Attending: Surgery | Admitting: Surgery

## 2011-06-12 DIAGNOSIS — Z9889 Other specified postprocedural states: Secondary | ICD-10-CM

## 2011-06-12 DIAGNOSIS — R51 Headache: Secondary | ICD-10-CM | POA: Diagnosis present

## 2011-06-12 DIAGNOSIS — F1011 Alcohol abuse, in remission: Secondary | ICD-10-CM

## 2011-06-12 DIAGNOSIS — D381 Neoplasm of uncertain behavior of trachea, bronchus and lung: Secondary | ICD-10-CM

## 2011-06-12 DIAGNOSIS — I2510 Atherosclerotic heart disease of native coronary artery without angina pectoris: Secondary | ICD-10-CM | POA: Diagnosis present

## 2011-06-12 DIAGNOSIS — R222 Localized swelling, mass and lump, trunk: Principal | ICD-10-CM | POA: Diagnosis present

## 2011-06-12 DIAGNOSIS — J4489 Other specified chronic obstructive pulmonary disease: Secondary | ICD-10-CM | POA: Diagnosis present

## 2011-06-12 DIAGNOSIS — Z79899 Other long term (current) drug therapy: Secondary | ICD-10-CM

## 2011-06-12 DIAGNOSIS — J449 Chronic obstructive pulmonary disease, unspecified: Secondary | ICD-10-CM | POA: Diagnosis present

## 2011-06-12 DIAGNOSIS — F17200 Nicotine dependence, unspecified, uncomplicated: Secondary | ICD-10-CM | POA: Diagnosis present

## 2011-06-12 DIAGNOSIS — F1411 Cocaine abuse, in remission: Secondary | ICD-10-CM | POA: Diagnosis present

## 2011-06-12 DIAGNOSIS — F1010 Alcohol abuse, uncomplicated: Secondary | ICD-10-CM | POA: Diagnosis present

## 2011-06-12 HISTORY — DX: Alcohol abuse, in remission: F10.11

## 2011-06-12 HISTORY — PX: VIDEO BRONCHOSCOPY: SHX5072

## 2011-06-12 SURGERY — BRONCHOSCOPY, VIDEO-ASSISTED
Anesthesia: General | Site: Chest | Wound class: Clean Contaminated

## 2011-06-12 MED ORDER — LACTATED RINGERS IV SOLN
INTRAVENOUS | Status: DC | PRN
  Administered 2011-06-12: 07:00:00 via INTRAVENOUS

## 2011-06-12 MED ORDER — NEOSTIGMINE METHYLSULFATE 1 MG/ML IJ SOLN
INTRAMUSCULAR | Status: DC | PRN
  Administered 2011-06-12: 3 mg via INTRAVENOUS

## 2011-06-12 MED ORDER — ROCURONIUM BROMIDE 100 MG/10ML IV SOLN
INTRAVENOUS | Status: DC | PRN
  Administered 2011-06-12: 40 mg via INTRAVENOUS
  Administered 2011-06-12: 20 mg via INTRAVENOUS

## 2011-06-12 MED ORDER — PHENYLEPHRINE HCL 10 MG/ML IJ SOLN
INTRAMUSCULAR | Status: DC | PRN
  Administered 2011-06-12: 80 ug via INTRAVENOUS

## 2011-06-12 MED ORDER — SENNOSIDES-DOCUSATE SODIUM 8.6-50 MG PO TABS
1.0000 | ORAL_TABLET | Freq: Every evening | ORAL | Status: DC | PRN
  Filled 2011-06-12 (×2): qty 1

## 2011-06-12 MED ORDER — BUPIVACAINE 0.5 % ON-Q PUMP SINGLE CATH 400 ML
400.0000 mL | INJECTION | Status: DC
  Filled 2011-06-12: qty 400

## 2011-06-12 MED ORDER — NALOXONE HCL 0.4 MG/ML IJ SOLN: 0.4000 mg | INTRAMUSCULAR | Status: DC | PRN

## 2011-06-12 MED ORDER — HYDROMORPHONE 0.3 MG/ML IV SOLN
INTRAVENOUS | Status: DC
Start: 2011-06-12 — End: 2011-06-15
  Administered 2011-06-12: 12:00:00 via INTRAVENOUS
  Administered 2011-06-12: 1.1 mL via INTRAVENOUS
  Administered 2011-06-12: 0.9 mg via INTRAVENOUS
  Administered 2011-06-12: 0.6 mg via INTRAVENOUS
  Administered 2011-06-13: 1.5 mg via INTRAVENOUS
  Administered 2011-06-13 (×3): 0.3 mg via INTRAVENOUS
  Administered 2011-06-14: 0.9 mg via INTRAVENOUS
  Administered 2011-06-14: 0.3 mg via INTRAVENOUS
  Administered 2011-06-14: 1.2 mg via INTRAVENOUS
  Administered 2011-06-14: 0.6 mg via INTRAVENOUS
  Administered 2011-06-15: 0.9 mL via INTRAVENOUS
  Administered 2011-06-15: 0.9 mg via INTRAVENOUS
  Administered 2011-06-15: 0.6 mg via INTRAVENOUS
  Filled 2011-06-12: qty 25

## 2011-06-12 MED ORDER — LIDOCAINE HCL (CARDIAC) 20 MG/ML IV SOLN
INTRAVENOUS | Status: DC | PRN
  Administered 2011-06-12: 100 mg via INTRAVENOUS

## 2011-06-12 MED ORDER — LORAZEPAM 2 MG/ML IJ SOLN: 1.0000 mg | Freq: Once | INTRAMUSCULAR | Status: DC | PRN

## 2011-06-12 MED ORDER — ACETAMINOPHEN 10 MG/ML IV SOLN
INTRAVENOUS | Status: DC | PRN
  Administered 2011-06-12: 1000 mg via INTRAVENOUS

## 2011-06-12 MED ORDER — KCL IN DEXTROSE-NACL 20-5-0.45 MEQ/L-%-% IV SOLN
INTRAVENOUS | Status: DC
  Administered 2011-06-12: 15:00:00 via INTRAVENOUS
  Administered 2011-06-13: 75 mL/h via INTRAVENOUS
  Administered 2011-06-14: 15:00:00 via INTRAVENOUS
  Filled 2011-06-12 (×5): qty 1000

## 2011-06-12 MED ORDER — BISACODYL 5 MG PO TBEC
10.0000 mg | DELAYED_RELEASE_TABLET | Freq: Every day | ORAL | Status: DC
  Administered 2011-06-13 – 2011-06-19 (×7): 10 mg via ORAL
  Filled 2011-06-12 (×7): qty 2

## 2011-06-12 MED ORDER — HYDROMORPHONE 0.3 MG/ML IV SOLN
INTRAVENOUS | Status: AC
  Filled 2011-06-12: qty 25

## 2011-06-12 MED ORDER — OXYCODONE-ACETAMINOPHEN 5-325 MG PO TABS
1.0000 | ORAL_TABLET | ORAL | Status: DC | PRN
  Administered 2011-06-13 – 2011-06-14 (×2): 2 via ORAL
  Administered 2011-06-15: 1 via ORAL
  Administered 2011-06-15: 2 via ORAL
  Administered 2011-06-15: 1 via ORAL
  Administered 2011-06-16: 2 via ORAL
  Administered 2011-06-16: 1 via ORAL
  Administered 2011-06-16: 2 via ORAL
  Administered 2011-06-16 – 2011-06-17 (×5): 1 via ORAL
  Administered 2011-06-18: 2 via ORAL
  Administered 2011-06-18: 1 via ORAL
  Administered 2011-06-18 – 2011-06-20 (×6): 2 via ORAL
  Filled 2011-06-12: qty 1
  Filled 2011-06-12 (×2): qty 2
  Filled 2011-06-12: qty 1
  Filled 2011-06-12 (×3): qty 2
  Filled 2011-06-12 (×6): qty 1
  Filled 2011-06-12 (×5): qty 2
  Filled 2011-06-12: qty 1
  Filled 2011-06-12 (×3): qty 2

## 2011-06-12 MED ORDER — HEMOSTATIC AGENTS (NO CHARGE) OPTIME
TOPICAL | Status: DC | PRN
  Administered 2011-06-12: 1 via TOPICAL

## 2011-06-12 MED ORDER — ONDANSETRON HCL 4 MG/2ML IJ SOLN
INTRAMUSCULAR | Status: DC | PRN
  Administered 2011-06-12: 4 mg via INTRAVENOUS

## 2011-06-12 MED ORDER — ACETAMINOPHEN 10 MG/ML IV SOLN
INTRAVENOUS | Status: AC
  Filled 2011-06-12: qty 100

## 2011-06-12 MED ORDER — ONDANSETRON HCL 4 MG/2ML IJ SOLN
4.0000 mg | Freq: Four times a day (QID) | INTRAMUSCULAR | Status: DC | PRN
  Filled 2011-06-12 (×2): qty 2

## 2011-06-12 MED ORDER — SIMVASTATIN 20 MG PO TABS
20.0000 mg | ORAL_TABLET | Freq: Every day | ORAL | Status: DC
  Administered 2011-06-12 – 2011-06-19 (×8): 20 mg via ORAL
  Filled 2011-06-12 (×9): qty 1

## 2011-06-12 MED ORDER — GLYCOPYRROLATE 0.2 MG/ML IJ SOLN
INTRAMUSCULAR | Status: DC | PRN
  Administered 2011-06-12 (×2): 0.4 mg via INTRAVENOUS
  Administered 2011-06-12: 0.2 mg via INTRAVENOUS

## 2011-06-12 MED ORDER — 0.9 % SODIUM CHLORIDE (POUR BTL) OPTIME
TOPICAL | Status: DC | PRN
  Administered 2011-06-12: 2000 mL

## 2011-06-12 MED ORDER — POTASSIUM CHLORIDE 10 MEQ/50ML IV SOLN
10.0000 meq | Freq: Every day | INTRAVENOUS | Status: DC | PRN
  Filled 2011-06-12: qty 50

## 2011-06-12 MED ORDER — DIPHENHYDRAMINE HCL 12.5 MG/5ML PO ELIX
12.5000 mg | ORAL_SOLUTION | Freq: Four times a day (QID) | ORAL | Status: DC | PRN
  Filled 2011-06-12: qty 5

## 2011-06-12 MED ORDER — BUPROPION HCL ER (SR) 100 MG PO TB12
100.0000 mg | ORAL_TABLET | Freq: Two times a day (BID) | ORAL | Status: DC
  Administered 2011-06-12 – 2011-06-20 (×16): 100 mg via ORAL
  Filled 2011-06-12 (×19): qty 1

## 2011-06-12 MED ORDER — KETOROLAC TROMETHAMINE 15 MG/ML IJ SOLN
15.0000 mg | Freq: Four times a day (QID) | INTRAMUSCULAR | Status: AC | PRN
  Administered 2011-06-12 – 2011-06-15 (×7): 15 mg via INTRAVENOUS
  Filled 2011-06-12 (×8): qty 1

## 2011-06-12 MED ORDER — PROPOFOL 10 MG/ML IV EMUL
INTRAVENOUS | Status: DC | PRN
  Administered 2011-06-12: 170 mg via INTRAVENOUS

## 2011-06-12 MED ORDER — TRAZODONE HCL 100 MG PO TABS
100.0000 mg | ORAL_TABLET | Freq: Every day | ORAL | Status: DC
  Administered 2011-06-12 – 2011-06-19 (×8): 100 mg via ORAL
  Filled 2011-06-12 (×9): qty 1

## 2011-06-12 MED ORDER — ASPIRIN 81 MG PO CHEW
81.0000 mg | CHEWABLE_TABLET | Freq: Every day | ORAL | Status: DC
  Administered 2011-06-12 – 2011-06-20 (×9): 81 mg via ORAL
  Filled 2011-06-12 (×9): qty 1

## 2011-06-12 MED ORDER — PHENOL 1.4 % MT LIQD
1.0000 | OROMUCOSAL | Status: DC | PRN
  Administered 2011-06-12: 1 via OROMUCOSAL
  Filled 2011-06-12: qty 177

## 2011-06-12 MED ORDER — SUCCINYLCHOLINE CHLORIDE 20 MG/ML IJ SOLN
INTRAMUSCULAR | Status: DC | PRN
  Administered 2011-06-12: 100 mg via INTRAVENOUS

## 2011-06-12 MED ORDER — MIDAZOLAM HCL 5 MG/5ML IJ SOLN
INTRAMUSCULAR | Status: DC | PRN
  Administered 2011-06-12: 2 mg via INTRAVENOUS

## 2011-06-12 MED ORDER — OXYCODONE HCL 5 MG PO TABS
ORAL_TABLET | ORAL | Status: AC
  Filled 2011-06-12: qty 2

## 2011-06-12 MED ORDER — HYDROMORPHONE HCL PF 1 MG/ML IJ SOLN
0.2500 mg | INTRAMUSCULAR | Status: DC | PRN
Start: 2011-06-12 — End: 2011-06-12
  Administered 2011-06-12 (×2): 1 mg via INTRAVENOUS

## 2011-06-12 MED ORDER — ONDANSETRON HCL 4 MG/2ML IJ SOLN
4.0000 mg | Freq: Four times a day (QID) | INTRAMUSCULAR | Status: DC | PRN
  Administered 2011-06-13 – 2011-06-15 (×2): 4 mg via INTRAVENOUS

## 2011-06-12 MED ORDER — DIPHENHYDRAMINE HCL 50 MG/ML IJ SOLN: 12.5000 mg | Freq: Four times a day (QID) | INTRAMUSCULAR | Status: DC | PRN

## 2011-06-12 MED ORDER — FENTANYL CITRATE 0.05 MG/ML IJ SOLN
INTRAMUSCULAR | Status: DC | PRN
  Administered 2011-06-12: 100 ug via INTRAVENOUS
  Administered 2011-06-12: 50 ug via INTRAVENOUS
  Administered 2011-06-12: 100 ug via INTRAVENOUS

## 2011-06-12 MED ORDER — OXYCODONE HCL 5 MG PO TABS
5.0000 mg | ORAL_TABLET | ORAL | Status: AC | PRN
  Administered 2011-06-12: 10 mg via ORAL

## 2011-06-12 MED ORDER — DEXTROSE 5 % IV SOLN
INTRAVENOUS | Status: DC | PRN
  Administered 2011-06-12 (×2): via INTRAVENOUS

## 2011-06-12 MED ORDER — BUPIVACAINE 0.5 % ON-Q PUMP SINGLE CATH 400 ML
INJECTION | Status: DC | PRN
  Administered 2011-06-12: 400 mL

## 2011-06-12 MED ORDER — ACETAMINOPHEN 10 MG/ML IV SOLN
1000.0000 mg | Freq: Four times a day (QID) | INTRAVENOUS | Status: AC
  Administered 2011-06-12 – 2011-06-13 (×4): 1000 mg via INTRAVENOUS
  Filled 2011-06-12 (×6): qty 100

## 2011-06-12 MED ORDER — PHENYLEPHRINE HCL 10 MG/ML IJ SOLN
20.0000 mg | INTRAVENOUS | Status: DC | PRN
  Administered 2011-06-12: 50 ug/min via INTRAVENOUS

## 2011-06-12 MED ORDER — BUPIVACAINE 0.5 % ON-Q PUMP SINGLE CATH 300 ML
300.0000 mL | INJECTION | Status: DC
  Filled 2011-06-12: qty 300

## 2011-06-12 MED ORDER — SODIUM CHLORIDE 0.9 % IJ SOLN: 9.0000 mL | INTRAMUSCULAR | Status: DC | PRN

## 2011-06-12 SURGICAL SUPPLY — 68 items
APPLICATOR TIP EXT COSEAL (VASCULAR PRODUCTS) IMPLANT
CANISTER SUCTION 2500CC (MISCELLANEOUS) ×3 IMPLANT
CATH KIT ON Q 5IN SLV (PAIN MANAGEMENT) ×3 IMPLANT
CATH THORACIC 28FR (CATHETERS) ×3 IMPLANT
CATH THORACIC 36FR (CATHETERS) IMPLANT
CATH THORACIC 36FR RT ANG (CATHETERS) IMPLANT
CLIP TI MEDIUM 24 (CLIP) ×3 IMPLANT
CLIP TI MEDIUM 6 (CLIP) ×3 IMPLANT
CLOTH BEACON ORANGE TIMEOUT ST (SAFETY) ×3 IMPLANT
CONN ST 1/4X3/8  BEN (MISCELLANEOUS) ×1
CONN ST 1/4X3/8 BEN (MISCELLANEOUS) ×2 IMPLANT
CONN Y 3/8X3/8X3/8  BEN (MISCELLANEOUS) ×1
CONN Y 3/8X3/8X3/8 BEN (MISCELLANEOUS) ×2 IMPLANT
CONT SPEC 4OZ CLIKSEAL STRL BL (MISCELLANEOUS) ×6 IMPLANT
COVER SURGICAL LIGHT HANDLE (MISCELLANEOUS) ×6 IMPLANT
DRAIN CHANNEL 32F RND 10.7 FF (WOUND CARE) ×3 IMPLANT
DRAPE LAPAROSCOPIC ABDOMINAL (DRAPES) ×3 IMPLANT
DRAPE SLUSH MACHINE 52X66 (DRAPES) IMPLANT
DRAPE SLUSH/WARMER DISC (DRAPES) IMPLANT
ELECT REM PT RETURN 9FT ADLT (ELECTROSURGICAL) ×3
ELECTRODE REM PT RTRN 9FT ADLT (ELECTROSURGICAL) ×2 IMPLANT
GLOVE BIO SURGEON STRL SZ 6 (GLOVE) ×9 IMPLANT
GLOVE EUDERMIC 7 POWDERFREE (GLOVE) ×6 IMPLANT
GOWN PREVENTION PLUS XLARGE (GOWN DISPOSABLE) ×3 IMPLANT
GOWN STRL NON-REIN LRG LVL3 (GOWN DISPOSABLE) ×9 IMPLANT
HANDLE STAPLE ENDO GIA SHORT (STAPLE) ×1
HEMOSTAT SURGICEL 2X14 (HEMOSTASIS) IMPLANT
KIT BASIN OR (CUSTOM PROCEDURE TRAY) ×3 IMPLANT
KIT ROOM TURNOVER OR (KITS) ×3 IMPLANT
NS IRRIG 1000ML POUR BTL (IV SOLUTION) ×6 IMPLANT
PACK CHEST (CUSTOM PROCEDURE TRAY) ×3 IMPLANT
PAD ARMBOARD 7.5X6 YLW CONV (MISCELLANEOUS) ×6 IMPLANT
RELOAD EGIA 60 MED/THCK PURPLE (STAPLE) ×3 IMPLANT
RELOAD EGIA TRIS TAN 45 CVD (STAPLE) ×18 IMPLANT
SEALANT PROGEL (MISCELLANEOUS) ×3 IMPLANT
SEALANT SURG COSEAL 4ML (VASCULAR PRODUCTS) IMPLANT
SEALANT SURG COSEAL 8ML (VASCULAR PRODUCTS) IMPLANT
SOLUTION ANTI FOG 6CC (MISCELLANEOUS) ×3 IMPLANT
SPONGE GAUZE 4X4 12PLY (GAUZE/BANDAGES/DRESSINGS) ×3 IMPLANT
STAPLER ENDO GIA 12MM SHORT (STAPLE) ×2 IMPLANT
STAPLER TA30 4.8 NON-ABS (STAPLE) ×3 IMPLANT
SUT PROLENE 3 0 SH DA (SUTURE) IMPLANT
SUT PROLENE 4 0 RB 1 (SUTURE)
SUT PROLENE 4-0 RB1 .5 CRCL 36 (SUTURE) IMPLANT
SUT SILK  1 MH (SUTURE) ×2
SUT SILK 1 MH (SUTURE) ×4 IMPLANT
SUT SILK 2 0 SH CR/8 (SUTURE) ×3 IMPLANT
SUT SILK 2 0SH CR/8 30 (SUTURE) IMPLANT
SUT VIC AB 1 CTX 18 (SUTURE) ×3 IMPLANT
SUT VIC AB 1 CTX 36 (SUTURE) ×1
SUT VIC AB 1 CTX36XBRD ANBCTR (SUTURE) ×2 IMPLANT
SUT VIC AB 2-0 CTX 36 (SUTURE) ×9 IMPLANT
SUT VIC AB 3-0 SH 27 (SUTURE) ×1
SUT VIC AB 3-0 SH 27X BRD (SUTURE) ×2 IMPLANT
SUT VIC AB 3-0 X1 27 (SUTURE) ×6 IMPLANT
SUT VICRYL 2 TP 1 (SUTURE) IMPLANT
SWAB COLLECTION DEVICE MRSA (MISCELLANEOUS) ×3 IMPLANT
SYSTEM SAHARA CHEST DRAIN RE-I (WOUND CARE) ×3 IMPLANT
TAPE CLOTH 4X10 WHT NS (GAUZE/BANDAGES/DRESSINGS) ×3 IMPLANT
TAPE CLOTH SURG 4X10 WHT LF (GAUZE/BANDAGES/DRESSINGS) ×3 IMPLANT
TAPE UMBILICAL 1/8 X36 TWILL (MISCELLANEOUS) ×3 IMPLANT
TIP APPLICATOR SPRAY EXTEND 16 (VASCULAR PRODUCTS) IMPLANT
TOWEL OR 17X24 6PK STRL BLUE (TOWEL DISPOSABLE) ×6 IMPLANT
TOWEL OR 17X26 10 PK STRL BLUE (TOWEL DISPOSABLE) ×6 IMPLANT
TRAY FOLEY CATH 14FRSI W/METER (CATHETERS) ×3 IMPLANT
TUBE ANAEROBIC SPECIMEN COL (MISCELLANEOUS) ×3 IMPLANT
TUNNELER SHEATH ON-Q 11GX8 (MISCELLANEOUS) ×3 IMPLANT
WATER STERILE IRR 1000ML POUR (IV SOLUTION) ×3 IMPLANT

## 2011-06-12 NOTE — Anesthesia Procedure Notes (Addendum)
Procedure Name: Intubation Date/Time: 06/12/2011 8:04 AM Performed by: Tyrone Nine Pre-anesthesia Checklist: Patient identified, Emergency Drugs available, Suction available, Patient being monitored and Timeout performed Patient Re-evaluated:Patient Re-evaluated prior to inductionOxygen Delivery Method: Circle system utilized Preoxygenation: Pre-oxygenation with 100% oxygen Intubation Type: IV induction Ventilation: Mask ventilation without difficulty Grade View: Grade I Tube type: Oral Tube size: 8.5 mm Number of attempts: 1 Airway Equipment and Method: Stylet Placement Confirmation: ETT inserted through vocal cords under direct vision,  breath sounds checked- equal and bilateral and positive ETCO2 Secured at: 21 cm Tube secured with: Tape Dental Injury: Teeth and Oropharynx as per pre-operative assessment    Procedure Name: Intubation Date/Time: 06/12/2011 8:27 AM Performed by: Helen Hashimoto F Patient Re-evaluated:Patient Re-evaluated prior to inductionOxygen Delivery Method: Circle system utilized Ventilation: Mask ventilation without difficulty Laryngoscope Size: Mac and 3 Grade View: Grade I Endobronchial tube: Left and Double lumen EBT and 39 Fr Laser Tube: Cuffed inflated with minimal occlusive pressure - saline Number of attempts: 1 Airway Equipment and Method: Fiberoptic brochoscope Placement Confirmation: ETT inserted through vocal cords under direct vision,  positive ETCO2,  CO2 detector and breath sounds checked- equal and bilateral Tube secured with: Tape Dental Injury: Teeth and Oropharynx as per pre-operative assessment     CVP: Timeout, sterile prep, drape, FBP L neck.  Trendelenburg position.  1% lido local, finder and trocar LIJ 2nd pass with US guidance.  2 lumen placed over J wire. Biopatch and sterile dressing on.  Patient tolerated well.  VSS.  Sandford Craze, MD

## 2011-06-12 NOTE — OR Nursing (Signed)
Bronchcoscopy start time 0819 end time 0821

## 2011-06-12 NOTE — Preoperative (Signed)
Beta Blockers   Reason not to administer Beta Blockers:Not Applicable 

## 2011-06-12 NOTE — Progress Notes (Signed)
Report given to Jamie RN

## 2011-06-12 NOTE — Anesthesia Postprocedure Evaluation (Signed)
  Anesthesia Post-op Note  Patient: [name]  Procedure(s) Performed: Procedure(s) (LRB): VIDEO BRONCHOSCOPY (N/A) VIDEO ASSISTED THORACOSCOPY (VATS)/THOROCOTOMY (Left) THOROCOTOMY WITH LOBECTOMY (Left)  Patient Location: PACU  Anesthesia Type: General  Level of Consciousness: awake  Airway and Oxygen Therapy: Patient Spontanous Breathing  Post-op Pain: mild  Post-op Assessment: Post-op Vital signs reviewed, Patient's Cardiovascular Status Stable, Respiratory Function Stable, Patent Airway, No signs of Nausea or vomiting and Pain level controlled  Post-op Vital Signs: stable  Complications: No apparent anesthesia complications

## 2011-06-12 NOTE — Brief Op Note (Signed)
06/12/2011  11:00 AM  PATIENT:  [name]  56 y.o. male  PRE-OPERATIVE DIAGNOSIS:  [name] MASS  POST-OPERATIVE DIAGNOSIS:  [name] MASS  PROCEDURE:  Procedure(s) (LRB): VIDEO BRONCHOSCOPY (N/A) Left thoracotomy and left upper lobectomy  SURGEON:  Surgeon(s) and Role:    * Alleen Borne, MD - Primary  PHYSICIAN ASSISTANT: Coral Ceo, PA-C   ANESTHESIA:   general  EBL:  Total I/O In: 150 [I.V.:150] Out: 250 [Urine:150; Blood:100]  BLOOD ADMINISTERED:none  DRAINS: 28 F chest tube anteriorly and 32 F Blake posteriorly at the base   SPECIMEN:  Source of Specimen:  left upper lobe  DISPOSITION OF SPECIMEN:  PATHOLOGY  COUNTS:  YES     PLAN OF CARE: Admit to inpatient   PATIENT DISPOSITION:  PACU - hemodynamically stable.

## 2011-06-12 NOTE — Anesthesia Preprocedure Evaluation (Addendum)
Anesthesia Evaluation  Patient identified by MRN, date of birth, ID band Patient awake    Reviewed: Allergy & Precautions, H&P , NPO status , Patient's Chart, lab work & pertinent test results  Airway Mallampati: I TM Distance: <3 FB Neck ROM: Full   Comment: Hx ACDF Dental  (+) Edentulous Upper and Edentulous Lower   Pulmonary shortness of breath, COPDformer smoker Lung ca + rhonchi   Pulmonary exam normal       Cardiovascular + angina with exertion + CAD Rhythm:Irregular Rate:Bradycardia     Neuro/Psych  Headaches, Depression    GI/Hepatic GERD-  Medicated and Controlled,(+)     substance abuse  alcohol use,   Endo/Other  negative endocrine ROS  Renal/GU negative Renal ROS     Musculoskeletal negative musculoskeletal ROS (+)   Abdominal   Peds  Hematology negative hematology ROS (+)   Anesthesia Other Findings   Reproductive/Obstetrics                        Anesthesia Physical Anesthesia Plan  ASA: III  Anesthesia Plan: General   Post-op Pain Management:    Induction: Intravenous  Airway Management Planned: Double Lumen EBT  Additional Equipment: Arterial line and CVP  Intra-op Plan:   Post-operative Plan: Extubation in OR  Informed Consent: I have reviewed the patients History and Physical, chart, labs and discussed the procedure including the risks, benefits and alternatives for the proposed anesthesia with the patient or authorized representative who has indicated his/her understanding and acceptance.     Plan Discussed with: CRNA and Surgeon  Anesthesia Plan Comments:         Anesthesia Quick Evaluation

## 2011-06-12 NOTE — Interval H&P Note (Signed)
History and Physical Interval Note:  06/12/2011 7:44 AM  Early Chars  has presented today for surgery, with the diagnosis of [name] MASS  The various methods of treatment have been discussed with the patient and family. After consideration of risks, benefits and other options for treatment, the patient has consented to  Procedure(s) (LRB): THOROCOTOMY WITH LOBECTOMY (Left) as a surgical intervention .  The patients' history has been reviewed, patient examined, no change in status, stable for surgery.  I have reviewed the patients' chart and labs.  Questions were answered to the patient's satisfaction.     Alleen Borne

## 2011-06-12 NOTE — Progress Notes (Signed)
Patient ID: [name], male   DOB: 11/01/1955, 56 y.o.   MRN: [id number]                   [address]            [place] [address]          [phone]     Day of Surgery Procedure(s) (LRB): VIDEO BRONCHOSCOPY (N/A) VIDEO ASSISTED THORACOSCOPY (VATS)/THOROCOTOMY (Left) THOROCOTOMY WITH LOBECTOMY (Left)  Total Length of Stay:  LOS: 0 days  BP 129/106  Pulse 63  Temp(Src) 97.9 F (36.6 C) (Oral)  Resp 18  Ht 5\' 7"  (1.702 m)  Wt 130 lb 12.8 oz (59.33 kg)  BMI 20.49 kg/m2  SpO2 98%     . dextrose 5 % and 0.45 % NaCl with KCl 20 mEq/L 75 mL/hr at 06/12/11 1456     Lab Results  Component Value Date   WBC 4.8 06/05/2011   HGB 13.5 06/05/2011   HCT 40.5 06/05/2011   PLT 215 06/05/2011   GLUCOSE 83 06/10/2011   CHOL 141 06/05/2011   TRIG 79 06/05/2011   HDL 59 06/05/2011   LDLCALC 66 06/05/2011   ALT 16 06/10/2011   AST 17 06/10/2011   NA 139 06/10/2011   K 4.2 06/10/2011   CL 106 06/10/2011   CREATININE 1.07 06/10/2011   BUN 15 06/10/2011   CO2 26 06/10/2011   INR 1.08 06/04/2011   Stable extubated after lobectomy  Delight Ovens MD  Beeper [phone] Office [phone] 06/12/2011 8:50 PM

## 2011-06-12 NOTE — Transfer of Care (Signed)
Immediate Anesthesia Transfer of Care Note  Patient: [name]  Procedure(s) Performed: Procedure(s) (LRB): VIDEO BRONCHOSCOPY (N/A) VIDEO ASSISTED THORACOSCOPY (VATS)/THOROCOTOMY (Left) THOROCOTOMY WITH LOBECTOMY (Left)  Patient Location: PACU  Anesthesia Type: General  Level of Consciousness: awake, alert , sedated and patient cooperative  Airway & Oxygen Therapy: Patient connected to face mask oxygen  Post-op Assessment: Report given to PACU RN and Post -op Vital signs reviewed and stable  Post vital signs: Reviewed and stable  Complications: No apparent anesthesia complications

## 2011-06-13 ENCOUNTER — Inpatient Hospital Stay (HOSPITAL_COMMUNITY): Payer: Medicaid Other

## 2011-06-13 LAB — CBC
HCT: 43.6 % (ref 39.0–52.0)
Hemoglobin: 14.9 g/dL (ref 13.0–17.0)
MCH: 32.8 pg (ref 26.0–34.0)
MCHC: 34.2 g/dL (ref 30.0–36.0)
MCV: 96 fL (ref 78.0–100.0)
Platelets: 210 10*3/uL (ref 150–400)
RBC: 4.54 MIL/uL (ref 4.22–5.81)
RDW: 14.2 % (ref 11.5–15.5)
WBC: 11 10*3/uL — ABNORMAL HIGH (ref 4.0–10.5)

## 2011-06-13 LAB — BASIC METABOLIC PANEL
BUN: 10 mg/dL (ref 6–23)
CO2: 25 mEq/L (ref 19–32)
Calcium: 8.2 mg/dL — ABNORMAL LOW (ref 8.4–10.5)
Chloride: 102 mEq/L (ref 96–112)
Creatinine, Ser: 0.94 mg/dL (ref 0.50–1.35)
GFR calc Af Amer: >90 mL/min (ref >90)
GFR calc non Af Amer: >90 mL/min (ref >90)
Glucose, Bld: 85 mg/dL (ref 70–99)
Potassium: 4.1 mEq/L (ref 3.5–5.1)
Sodium: 135 mEq/L (ref 135–145)

## 2011-06-13 LAB — POCT I-STAT 3, ART BLOOD GAS (G3+)
Bicarbonate: 26.6 mEq/L — ABNORMAL HIGH (ref 20.0–24.0)
O2 Saturation: 88 %
Patient temperature: 97.6
TCO2: 28 mmol/L (ref 0–100)
pCO2 arterial: 49.9 mmHg — ABNORMAL HIGH (ref 35.0–45.0)
pH, Arterial: 7.331 — ABNORMAL LOW (ref 7.350–7.450)
pO2, Arterial: 57 mmHg — ABNORMAL LOW (ref 80.0–100.0)

## 2011-06-13 MED ORDER — SODIUM CHLORIDE 0.9 % IJ SOLN
10.0000 mL | Freq: Two times a day (BID) | INTRAMUSCULAR | Status: DC
  Administered 2011-06-13 – 2011-06-14 (×3): 10 mL
  Administered 2011-06-14: 20 mL
  Administered 2011-06-15: 10 mL
  Administered 2011-06-15: 30 mL

## 2011-06-13 MED ORDER — SODIUM CHLORIDE 0.9 % IJ SOLN: 10.0000 mL | Freq: Two times a day (BID) | INTRAMUSCULAR | Status: DC

## 2011-06-13 MED ORDER — SODIUM CHLORIDE 0.9 % IJ SOLN
10.0000 mL | INTRAMUSCULAR | Status: DC | PRN
  Administered 2011-06-13: 10 mL

## 2011-06-13 MED ORDER — FUROSEMIDE 10 MG/ML IJ SOLN
40.0000 mg | Freq: Once | INTRAMUSCULAR | Status: AC
  Administered 2011-06-13: 40 mg via INTRAVENOUS
  Filled 2011-06-13: qty 4

## 2011-06-13 MED ORDER — POTASSIUM CHLORIDE CRYS ER 20 MEQ PO TBCR
40.0000 meq | EXTENDED_RELEASE_TABLET | Freq: Once | ORAL | Status: AC
  Administered 2011-06-13: 40 meq via ORAL
  Filled 2011-06-13: qty 2

## 2011-06-13 NOTE — Progress Notes (Signed)
1 Day Post-Op Procedure(s) (LRB): VIDEO BRONCHOSCOPY (N/A) VIDEO ASSISTED THORACOSCOPY (VATS)/THOROCOTOMY (Left) THOROCOTOMY WITH LOBECTOMY (Left) Subjective: No complaints.  Pain under control  Objective: Vital signs in last 24 hours: Temp:  [97.6 F (36.4 C)-98.4 F (36.9 C)] 97.9 F (36.6 C) (05/10 0736) Pulse Rate:  [40-91] 47  (05/10 0600) Cardiac Rhythm:  [-] Sinus bradycardia (05/09 2000) Resp:  [6-35] 14  (05/10 0600) BP: (92-182)/(38-106) 118/81 mmHg (05/10 0600) SpO2:  [93 %-100 %] 96 % (05/10 0600) Arterial Line BP: (95-155)/(58-82) 127/71 mmHg (05/10 0600) Weight:  [59.33 kg (130 lb 12.8 oz)-60.3 kg (132 lb 15 oz)] 60.3 kg (132 lb 15 oz) (05/10 0400)  Hemodynamic parameters for last 24 hours:    Intake/Output from previous day: 05/09 0701 - 05/10 0700 In: 3991.1 [P.O.:840; I.V.:2796.1; IV Piggyback:300] Out: 1635 [Urine:1165; Blood:100; Chest Tube:370] Intake/Output this shift:    General appearance: alert and cooperative Heart: regular rate and rhythm, S1, S2 normal, no murmur, click, rub or gallop Lungs: clear to auscultation bilaterally  Lab Results:  Basename 06/13/11 0345  WBC 11.0*  HGB 14.9  HCT 43.6  PLT 210   BMET:  Basename 06/13/11 0345 06/10/11 1445  NA 135 139  K 4.1 4.2  CL 102 106  CO2 25 26  GLUCOSE 85 83  BUN 10 15  CREATININE 0.94 1.07  CALCIUM 8.2* 9.3    PT/INR: No results found for this basename: LABPROT,INR in the last 72 hours ABG    Component Value Date/Time   PHART 7.331* 06/13/2011 0332   HCO3 26.6* 06/13/2011 0332   TCO2 28 06/13/2011 0332   ACIDBASEDEF 0.2 06/10/2011 1500   O2SAT 88.0 06/13/2011 0332   CBG (last 3)  No results found for this basename: GLUCAP:3 in the last 72 hours  CXR:  Ok  Assessment/Plan: S/P Procedure(s) (LRB): VIDEO BRONCHOSCOPY (N/A) VIDEO ASSISTED THORACOSCOPY (VATS)/THOROCOTOMY (Left) THOROCOTOMY WITH LOBECTOMY (Left) Mobilize Diuresis CT's to water seal IS   LOS: 1 day     [name] K 06/13/2011

## 2011-06-13 NOTE — Op Note (Signed)
NAME:  [name], [name] NO.:  [id number]  MEDICAL RECORD NO.:  [id number]  LOCATION:  2304                         FACILITY:  [facility]  PHYSICIAN:  Evelene Croon, M.D.     DATE OF BIRTH:  03/27/1955  DATE OF PROCEDURE:  06/12/2011 DATE OF DISCHARGE:                              OPERATIVE REPORT   PREOPERATIVE DIAGNOSIS:  Left upper lobe lung mass.  POSTOPERATIVE DIAGNOSIS:  Left upper lobe lung mass.  PROCEDURE:  Flexible fiberoptic bronchoscopy, left muscle-sparing thoracotomy with left upper lobectomy.  ATTENDING SURGEON:  Evelene Croon, MD  ASSISTANT:  Coral Ceo, PA-C  ANESTHESIA:  General endotracheal.  CLINICAL HISTORY:  This patient is a 56 year old active smoker with history of substance abuse, who was admitted to the hospital in [facility] in September of 2012, with atypical chest pain.  A chest x-ray in November 2012 showed a left upper lobe pleural-based lung mass.  CT scan showed a small spiculated pleural-based density in the left upper lobe of lung laterally.  The patient was referred to Dr. Levy Pupa for further evaluation.  A repeat CT scan of the chest was performed in April of this year, which showed a 2.8 x 1.8-cm spiculated opacity in the left upper lobe laterally that was highly suspicious for bronchogenic carcinoma.  The lesion had increased slightly in size from the previous measurement in November.  We obtained a PET scan, which showed hypermetabolic uptake within the lesion with a SUV of 5.0.  There are no other concerning areas of hypermetabolic activity.  The patient underwent preoperative cardiac clearance including a cardiac catheterization given his risk factors and history and some symptoms of chest discomfort that he was having.  This showed no significant coronary artery disease.  After review of all of these things, we did pulmonary function testing, which showed an FEV 1 of 2.53, that was 81% of predicted with an FVC of  3.86, which was 97% of predicted.  His diffusion capacity was markedly reduced at 31% of predicted.  The patient did not desaturate with walking test and therefore, I felt that he would tolerate left upper lobectomy.  We discussed the options proceeding with a CT-guided needle biopsy first prior surgery or just proceeding with left upper lobectomy and he decided to proceed ahead with surgery, which was I thought was a reasonable choice given his age and the likelihood that this was a lung cancer.  I discussed the operative procedure with the patient including alternatives, benefits, and risks including, but not limited to, bleeding, blood transfusion, infection, bronchial stump complications, prolonged air leak, the possibility that he may have positive lymph nodes and require further treatment, recurrence of this cancer, respiratory failure, and death. He understood all of this and agreed to proceed.  OPERATIVE PROCEDURE:  The patient was seen in the preoperative holding area.  His CT scan was reviewed and his history reviewed.  The left side of the chest was signed by me after confirming the proper patient, proper operation, and proper operative side with the patient and the chart.  Then, the patient was given preoperative intravenous antibiotics.  He was taken back to the operating room and placed  on the table in supine position.  After induction of general endotracheal anesthesia, a Foley catheter was placed in the bladder using sterile technique.  Lower extremity pneumatic compression devices were used. Then, flexible fiberoptic bronchoscopy was performed.  The distal trachea was normal.  The carina was sharp.  Both the left and right bronchial tree had normal segmental anatomy.  There was small amount of mucus present throughout.  There were no endobronchial lesions and no sign of extrinsic compression.  The bronchoscope was withdrawn from the patient.  Then, the single-lumen tube  was converted to a double-lumen tube by Anesthesiology.  The patient was positioned in the right lateral decubitus position with left side up.  Then, the left side of the chest were prepped with Betadine soap and solution, draped in usual sterile manner.  Time-out was taken.  Proper patient, proper operation, proper operative site were confirmed with nursing and anesthesia staff while reviewing the CT scan in the operating room.  Then, the left chest was entered through a lateral muscle-sparing thoracotomy incision.  The pleural space was entered through the fourth intercostal space.  Examination of the pleural space showed no abnormality.  Examination of the left upper lobe showed an umbilicated mass present laterally.  There did not appear to be involvement of the visceral pleura, although was umbilicated.  There were no other lung masses seen in the lower lobe.  There was significant bullous disease of the apex of the left upper lobe.  The surface of the lung mass was essentially black.  Then, the mediastinal pleura was opened over the hilar vessels.  The phrenic nerve was identified and carefully avoided.  Dissection was continued to expose the left upper lobe pulmonary artery branches. There were two major branches and these were encircled with tapes and then divided using a vascular staplers.  Then, the interlobar portion of the pulmonary artery was dissected.  There was a nearly complete fissure.  There were two branches to the lingula and these were both encircled with tapes and divided using a vascular stapler.  The superior segmental and basal segmental pulmonary artery branches were identified and preserved.  Then, the lung was retracted laterally to expose the left superior pulmonary vein.  This was encircled with a tape and divided using a vascular stapler.  There was small part of the fissure between the upper and lower lobes anteriorly, which was not divided naturally  and we divided this using a stapler.  The left upper lobe bronchus was easily identified and encircled with a tape.  A TL-30 stapler with 4.8-mm staples was placed around the left upper lobe bronchus.  It was closed and then the left lung was inflated.  The left lower lobe was inflated without difficulty.  Then, the lung was deflated and stapler fired.  The left upper lobe bronchus was divided distal to the stapler and the specimen sent to pathology.  I did not perform a frozen section on the bronchial margin since this was a peripheral lesion.  The bronchial stump was then tested under warm saline solution, two 30 cm of water pressure and there were no air leak seen.  ProGel was used to cover the small staple line between the upper and lower lobe. There was a large AP window lymph node, which was removed and sent as a separate specimen.  No other significant lymph nodes were identified. Then, the inferior pulmonary ligament was divided up to the inferior pulmonary vein to the lateral lower lobe  to rise.  Then, an On-Q catheter was brought through a separate stab incision and positioned in a subpleural location posterior to the thoracotomy incision.  It was flushed with 5 mL of 0.25% Marcaine and connected to the pinball.  Then, two chest tubes were placed.  I used a 32-French Blake drain, positioned posteriorly and at the base of the chest, and a 28-French straight chest tube anteriorly and up to the apex.  The ribs were then reapproximated with #2 Vicryl pericostal sutures.  The left lung was inflated before tying the sutures and appeared to completely inflate.  Then, the muscle debris were returned to the normal anatomic position.  Subcutaneous tissue was closed with continuous 2-0 Vicryl and the skin with a 3-0 Vicryl subcuticular closure.  The sponge, needle, and instrument counts were correct according to the scrub nurse.  Dry sterile dressing was applied around the chest tubes and  over the incision.  The chest tubes were connected to Pleur-Evac suction.  The patient was then returned to supine position, extubated, and transferred to the postanesthesia care unit in satisfactory and stable condition.     Evelene Croon, M.D.     BB/MEDQ  D:  06/12/2011  T:  06/13/2011  Job:  [id number]

## 2011-06-13 NOTE — Progress Notes (Signed)
Patient ID: [name], male   DOB: 04-Aug-1955, 56 y.o.   MRN: [id number]  Filed Vitals:   06/13/11 1643 06/13/11 1700 06/13/11 1800 06/13/11 1900  BP:  95/64 106/77 98/69  Pulse:  48 64 53  Temp:      TempSrc:      Resp: 12 14 16 18   Height:      Weight:      SpO2: 96% 95% 99% 98%   Pain under control  Stable day.

## 2011-06-13 NOTE — Care Management Note (Signed)
    Page 1 of 2   06/16/2011     12:33:38 PM   CARE MANAGEMENT NOTE 06/16/2011  Patient:  [name], [name]   Account Number:  [id number]  Date Initiated:  06/13/2011  Documentation initiated by:  AMERSON,JULIE  Subjective/Objective Assessment:   PT S/P LT VATS ON 06/12/11.  PTA, PT INDEPENDENT, STATES HE HAS BEEN STAYING WITH HIS NIECE, BUT SHE IS MOVING.     Action/Plan:   PT STATES HE WILL BE HOMELESS UPON DISCHARGE FROM HOSPITAL. WILL CONSULT CSW TO ASSIST WITH DC OPTIONS.   Anticipated DC Date:  06/17/2011   Anticipated DC Plan:  HOME/SELF CARE  In-house referral  Clinical Social Worker      DC Planning Services  CM consult      Choice offered to / List presented to:             Status of service:  In process, will continue to follow Medicare Important Message given?   (If response is "NO", the following Medicare IM given date fields will be blank) Date Medicare IM given:   Date Additional Medicare IM given:    Discharge Disposition:    Per UR Regulation:  Reviewed for med. necessity/level of care/duration of stay  If discussed at Long Length of Stay Meetings, dates discussed:    Comments:  06-16-11 12:25pm [name] [phone] SW talked with patient - noted documentation.  [facility] unable to take any new patients at this time per [name].  Explained to patient that if set up with [facility] will still need45$ initially for visit. Other option would be to free clinic [facility] - now have a PA that sees patient if ok with physicians.  Would of course prefer the free clinic, butif now feels his family could assist with money for [facility].  Will wait on physician input.  06/13/11 JULIE AMERSON,RN,BSN  1530 PT STATES HE HAS BEEN SLEEPING ON HIS NIECE'S COUCH FOR A WHILE, BUT SHE IS MOVING.  HE HAS STAYED AT [facility] IN THE PAST.  HE IS CONCERNED ABOUT WHAT HE WILL DO AT DISCHARGE.  CSW TO SEE TO DISCUSS DC OPTIONS.

## 2011-06-14 ENCOUNTER — Inpatient Hospital Stay (HOSPITAL_COMMUNITY): Payer: Medicaid Other

## 2011-06-14 LAB — CBC
HCT: 40.3 % (ref 39.0–52.0)
Hemoglobin: 13.4 g/dL (ref 13.0–17.0)
MCH: 32.4 pg (ref 26.0–34.0)
MCHC: 33.3 g/dL (ref 30.0–36.0)
MCV: 97.3 fL (ref 78.0–100.0)
Platelets: 194 10*3/uL (ref 150–400)
RBC: 4.14 MIL/uL — ABNORMAL LOW (ref 4.22–5.81)
RDW: 14.3 % (ref 11.5–15.5)
WBC: 8.1 10*3/uL (ref 4.0–10.5)

## 2011-06-14 LAB — COMPREHENSIVE METABOLIC PANEL
ALT: 15 U/L (ref 0–53)
AST: 26 U/L (ref 0–37)
Albumin: 2.7 g/dL — ABNORMAL LOW (ref 3.5–5.2)
Alkaline Phosphatase: 71 U/L (ref 39–117)
BUN: 13 mg/dL (ref 6–23)
CO2: 27 mEq/L (ref 19–32)
Calcium: 8.4 mg/dL (ref 8.4–10.5)
Chloride: 102 mEq/L (ref 96–112)
Creatinine, Ser: 1.09 mg/dL (ref 0.50–1.35)
GFR calc Af Amer: 86 mL/min — ABNORMAL LOW (ref >90)
GFR calc non Af Amer: 74 mL/min — ABNORMAL LOW (ref >90)
Glucose, Bld: 85 mg/dL (ref 70–99)
Potassium: 4.4 mEq/L (ref 3.5–5.1)
Sodium: 135 mEq/L (ref 135–145)
Total Bilirubin: 0.4 mg/dL (ref 0.3–1.2)
Total Protein: 5.4 g/dL — ABNORMAL LOW (ref 6.0–8.3)

## 2011-06-14 NOTE — Progress Notes (Signed)
2 Days Post-Op Procedure(s) (LRB): VIDEO BRONCHOSCOPY (N/A) VIDEO ASSISTED THORACOSCOPY (VATS)/THOROCOTOMY (Left) THOROCOTOMY WITH LOBECTOMY (Left) Subjective: C/o chest wall pain  Objective: Vital signs in last 24 hours: Temp:  [97.5 F (36.4 C)-99.4 F (37.4 C)] 99.4 F (37.4 C) (05/11 0718) Pulse Rate:  [48-117] 57  (05/11 1000) Cardiac Rhythm:  [-] Sinus bradycardia (05/11 0800) Resp:  [12-22] 18  (05/11 1000) BP: (85-122)/(57-82) 122/74 mmHg (05/11 1000) SpO2:  [92 %-99 %] 96 % (05/11 1000)  Hemodynamic parameters for last 24 hours:    Intake/Output from previous day: 05/10 0701 - 05/11 0700 In: 1752.3 [P.O.:1000; I.V.:536.3; IV Piggyback:106] Out: 2055 [Urine:1825; Chest Tube:230] Intake/Output this shift: Total I/O In: 60 [I.V.:60] Out: -   General appearance: alert and cooperative Heart: regular rate and rhythm, S1, S2 normal, no murmur, click, rub or gallop Lungs: clear to auscultation bilaterally No air leak from chest tube. Lab Results:  Basename 06/14/11 0406 06/13/11 0345  WBC 8.1 11.0*  HGB 13.4 14.9  HCT 40.3 43.6  PLT 194 210   BMET:  Basename 06/14/11 0406 06/13/11 0345  NA 135 135  K 4.4 4.1  CL 102 102  CO2 27 25  GLUCOSE 85 85  BUN 13 10  CREATININE 1.09 0.94  CALCIUM 8.4 8.2*    PT/INR: No results found for this basename: LABPROT,INR in the last 72 hours ABG    Component Value Date/Time   PHART 7.331* 06/13/2011 0332   HCO3 26.6* 06/13/2011 0332   TCO2 28 06/13/2011 0332   ACIDBASEDEF 0.2 06/10/2011 1500   O2SAT 88.0 06/13/2011 0332   CBG (last 3)  No results found for this basename: GLUCAP:3 in the last 72 hours  CXR:  Slightly decreased aeration on the left compared to yesterday.  Assessment/Plan: S/P Procedure(s) (LRB): VIDEO BRONCHOSCOPY (N/A) VIDEO ASSISTED THORACOSCOPY (VATS)/THOROCOTOMY (Left) THOROCOTOMY WITH LOBECTOMY (Left) Remove anterior chest tube today, posterior tomorrow. Continue PCA today IS and  ambulation Pathology pending   LOS: 2 days    Marti Mclane K 06/14/2011

## 2011-06-15 ENCOUNTER — Inpatient Hospital Stay (HOSPITAL_COMMUNITY): Payer: Medicaid Other

## 2011-06-15 MED ORDER — ENOXAPARIN SODIUM 40 MG/0.4ML ~~LOC~~ SOLN
40.0000 mg | SUBCUTANEOUS | Status: DC
  Administered 2011-06-15: 40 mg via SUBCUTANEOUS
  Filled 2011-06-15 (×7): qty 0.4

## 2011-06-15 NOTE — Progress Notes (Signed)
7cc of dilaudid wasted in sink.  K.Jencarlo Bonadonna/P.Keaton

## 2011-06-15 NOTE — Progress Notes (Signed)
3 Days Post-Op Procedure(s) (LRB): VIDEO BRONCHOSCOPY (N/A) VIDEO ASSISTED THORACOSCOPY (VATS)/THOROCOTOMY (Left) THOROCOTOMY WITH LOBECTOMY (Left) Subjective: No complaints  Objective: Vital signs in last 24 hours: Temp:  [97.8 F (36.6 C)-99.2 F (37.3 C)] 98.9 F (37.2 C) (05/12 0744) Pulse Rate:  [51-79] 57  (05/12 0900) Cardiac Rhythm:  [-] Sinus bradycardia (05/12 0742) Resp:  [13-22] 15  (05/12 0900) BP: (90-139)/(48-90) 106/70 mmHg (05/12 0900) SpO2:  [89 %-98 %] 97 % (05/12 0900) Weight:  [60.5 kg (133 lb 6.1 oz)] 60.5 kg (133 lb 6.1 oz) (05/12 0600)  Hemodynamic parameters for last 24 hours:    Intake/Output from previous day: 05/11 0701 - 05/12 0700 In: 1030 [P.O.:580; I.V.:450] Out: 1130 [Urine:950; Chest Tube:180] Intake/Output this shift: Total I/O In: 40 [I.V.:40] Out: -   Awake and alert Lungs:  Slight rhonchi on left Heart:  RRR   Lab Results:  Basename 06/14/11 0406 06/13/11 0345  WBC 8.1 11.0*  HGB 13.4 14.9  HCT 40.3 43.6  PLT 194 210   BMET:  Basename 06/14/11 0406 06/13/11 0345  NA 135 135  K 4.4 4.1  CL 102 102  CO2 27 25  GLUCOSE 85 85  BUN 13 10  CREATININE 1.09 0.94  CALCIUM 8.4 8.2*    PT/INR: No results found for this basename: LABPROT,INR in the last 72 hours ABG    Component Value Date/Time   PHART 7.331* 06/13/2011 0332   HCO3 26.6* 06/13/2011 0332   TCO2 28 06/13/2011 0332   ACIDBASEDEF 0.2 06/10/2011 1500   O2SAT 88.0 06/13/2011 0332   CBG (last 3)  No results found for this basename: GLUCAP:3 in the last 72 hours  Assessment/Plan: S/P Procedure(s) (LRB): VIDEO BRONCHOSCOPY (N/A) VIDEO ASSISTED THORACOSCOPY (VATS)/THOROCOTOMY (Left) THOROCOTOMY WITH LOBECTOMY (Left) DC remaining chest tube Continue IS and ambulation Pathology still pending Transfer to 2000 Will need social work help with discharge planning   LOS: 3 days    Kalynne Womac K 06/15/2011

## 2011-06-16 ENCOUNTER — Encounter (HOSPITAL_COMMUNITY): Payer: Self-pay | Admitting: Surgery

## 2011-06-16 NOTE — Progress Notes (Signed)
UR Completed.  Kiira Brach Jane [phone] 06/16/2011  

## 2011-06-16 NOTE — Evaluation (Signed)
Physical Therapy Evaluation Patient Details Name: [name] MRN: [id number] DOB: [birth year] Today's Date: 06/16/2011 Time: 1347-1410 PT Time Calculation (min): 23 min  PT Assessment / Plan / Recommendation Clinical Impression  Pt admitted with [name] mass s/p VATS thoracotomy and lobectomy. Pt with moderate balance deficits in need of further therapy with decreased activity tolerance. Pt may benefit from ST-SNF if unable to meet mod I level prior to discharge. If goals met then no further therapy needs. Will follow acutely to maximize balance, activity tolerance and mobility to decrease burden of care.     PT Assessment  Patient needs continued PT services    Follow Up Recommendations  Skilled nursing facility;No PT follow up    Barriers to Discharge Decreased caregiver support      lEquipment Recommendations  None recommended by PT    Recommendations for Other Services OT consult   Frequency Min 3X/week    Precautions / Restrictions Precautions Precautions: Fall Precaution Comments: oxygen   Pertinent Vitals/Pain No pain Oxygen 95% on 3L at rest with HR 67, down to 86% with ambulation and HR maintained 67 On 4L [name] 95% with ambulation      Mobility  Bed Mobility Bed Mobility: Rolling Right;Right Sidelying to Sit Rolling Right: 5: Supervision Right Sidelying to Sit: 5: Supervision;HOB flat Details for Bed Mobility Assistance: cueing to sequence, pt initially trying to pivot to edge and elevate trunk with abs but unable and increased ability with cues to sequence Transfers Transfers: Sit to Stand;Stand to Sit Sit to Stand: 6: Modified independent (Device/Increase time);From chair/3-in-1;From bed Stand to Sit: 6: Modified independent (Device/Increase time);To bed;To chair/3-in-1 Ambulation/Gait Ambulation/Gait Assistance: 5: Supervision Ambulation Distance (Feet): 400 Feet Assistive device: None Ambulation/Gait Assistance Details: Pt with inability to vary gait speed  and unsteady gait with horizontal head turns Gait Pattern: Step-through pattern;Decreased stride length Stairs: No    Exercises     PT Diagnosis: Abnormality of gait  PT Problem List: Decreased activity tolerance;Decreased balance PT Treatment Interventions: Gait training;Stair training;Functional mobility training;Therapeutic activities;Balance training;Patient/family education   PT Goals Acute Rehab PT Goals PT Goal Formulation: With patient Time For Goal Achievement: 06/23/11 Potential to Achieve Goals: Good Pt will go Supine/Side to Sit: with modified independence PT Goal: Supine/Side to Sit - Progress: Goal set today Pt will go Sit to Supine/Side: with modified independence PT Goal: Sit to Supine/Side - Progress: Goal set today Pt will go Sit to Stand: Independently PT Goal: Sit to Stand - Progress: Goal set today Pt will go Stand to Sit: Independently PT Goal: Stand to Sit - Progress: Goal set today Pt will Ambulate: >150 feet;with modified independence;Other (comment) (with O2 sats > 92% throughout) PT Goal: Ambulate - Progress: Goal set today Pt will Go Up / Down Stairs: Flight;with rail(s);with modified independence PT Goal: Up/Down Stairs - Progress: Goal set today Additional Goals Additional Goal #1: Pt will score > or equal to 51 on Berg assessment to decrease fall risk  Visit Information  Last PT Received On: 06/16/11 Assistance Needed: +1    Subjective Data  Subjective: its a little tricky to breathe Patient Stated Goal: take care of myself   Prior Functioning  Home Living Lives With: Alone Type of Home: Homeless Home Adaptive Equipment: None Prior Function Level of Independence: Independent Able to Take Stairs?: Yes Driving: No Vocation: Unemployed Communication Communication: No difficulties    Cognition  Overall Cognitive Status: Appears within functional limits for tasks assessed/performed Arousal/Alertness: Awake/alert Orientation Level:  Appears intact  for tasks assessed Behavior During Session: [facility] for tasks performed    Extremity/Trunk Assessment Right Lower Extremity Assessment RLE ROM/Strength/Tone: Within functional levels Left Lower Extremity Assessment LLE ROM/Strength/Tone: Within functional levels   Balance Standardized Balance Assessment Standardized Balance Assessment: Berg Balance Test Berg Balance Test Sit to Stand: Able to stand  independently using hands Standing Unsupported: Able to stand safely 2 minutes Sitting with Back Unsupported but Feet Supported on Floor or Stool: Able to sit safely and securely 2 minutes Stand to Sit: Sits safely with minimal use of hands Transfers: Able to transfer safely, definite need of hands Standing Unsupported with Eyes Closed: Able to stand 10 seconds safely Standing Ubsupported with Feet Together: Able to place feet together independently and stand 1 minute safely From Standing, Reach Forward with Outstretched Arm: Can reach forward >12 cm safely (5") From Standing Position, Pick up Object from Floor: Able to pick up shoe safely and easily From Standing Position, Turn to Look Behind Over each Shoulder: Turn sideways only but maintains balance Turn 360 Degrees: Able to turn 360 degrees safely one side only in 4 seconds or less Standing Unsupported, Alternately Place Feet on Step/Stool: Able to stand independently and safely and complete 8 steps in 20 seconds Standing Unsupported, One Foot in Front: Able to plae foot ahead of the other independently and hold 30 seconds Standing on One Leg: Able to lift leg independently and hold equal to or more than 3 seconds Total Score: 47   End of Session PT - End of Session Equipment Utilized During Treatment: Gait belt Activity Tolerance: Patient tolerated treatment well Patient left: in chair;with call bell/phone within reach   Delorse Lek 06/16/2011, 2:21 PM  Delaney Meigs, PT [phone]

## 2011-06-16 NOTE — Progress Notes (Signed)
[facility] as ordered, and per protocol, tip intact, pressure applied and dressing applied willmonitor patient. Moataz Tavis, Randall An RN

## 2011-06-16 NOTE — Progress Notes (Addendum)
4 Days Post-Op Procedure(s) (LRB): VIDEO BRONCHOSCOPY (N/A) VIDEO ASSISTED THORACOSCOPY (VATS)/THOROCOTOMY (Left) THOROCOTOMY WITH LOBECTOMY (Left) Subjective:  [name] complains of pain at incision site this morning.  Objective: Vital signs in last 24 hours: Temp:  [98.3 F (36.8 C)-98.6 F (37 C)] 98.4 F (36.9 C) (05/13 0404) Pulse Rate:  [49-72] 56  (05/13 0404) Cardiac Rhythm:  [-] Sinus bradycardia (05/12 1945) Resp:  [10-22] 19  (05/13 0404) BP: (89-123)/(48-76) 114/69 mmHg (05/13 0404) SpO2:  [93 %-97 %] 96 % (05/13 0404)  Intake/Output from previous day: 05/12 0701 - 05/13 0700 In: 320 [P.O.:240; I.V.:80] Out: 1100 [Urine:1100]    General appearance: alert, cooperative and no distress Heart: regular rate and rhythm Lungs: rhonchi left Abdomen: soft, non-tender; bowel sounds normal; no masses,  no organomegaly Wound: clean and dry  Lab Results:  Basename 06/14/11 0406  WBC 8.1  HGB 13.4  HCT 40.3  PLT 194   BMET:  Basename 06/14/11 0406  NA 135  K 4.4  CL 102  CO2 27  GLUCOSE 85  BUN 13  CREATININE 1.09  CALCIUM 8.4    PT/INR: No results found for this basename: LABPROT,INR in the last 72 hours ABG    Component Value Date/Time   PHART 7.331* 06/13/2011 0332   HCO3 26.6* 06/13/2011 0332   TCO2 28 06/13/2011 0332   ACIDBASEDEF 0.2 06/10/2011 1500   O2SAT 88.0 06/13/2011 0332   CBG (last 3)  No results found for this basename: GLUCAP:3 in the last 72 hours  Assessment/Plan: S/P Procedure(s) (LRB): VIDEO BRONCHOSCOPY (N/A) VIDEO ASSISTED THORACOSCOPY (VATS)/THOROCOTOMY (Left) THOROCOTOMY WITH LOBECTOMY (Left)  2. Pain- on Percocet, with some relief 3. Resp- some rhonchi present, continue IS, wean oxygen as tolerated 4. Pathology remains pending 5. Dispo- patient is medically stable at this time, however he is homeless.  Social work is involved helping to arrange discharge placement   LOS: 4 days    [name] 06/16/2011    Chart  reviewed, patient examined, agree with above. Pathology pending DC central line Continue IS and ambulation

## 2011-06-16 NOTE — Consult Note (Signed)
Pt is a 1 ppd smoker who's been a smoker for 30 years and has quit once for 2 yrs before. He is in action stage and wants to quit cold [place]. Encouraged and congratulated pt on planning to quit. Pt to f/u with me if he's not successful at quitting on his own. Referred to 1-800 quit now for f/u and support. Discussed oral fixation substitutes, second hand smoke and in home smoking policy. Reviewed and gave pt Written education/contact information.

## 2011-06-16 NOTE — Progress Notes (Signed)
OT Cancellation Note  ___Treatment cancelled today due to medical issues with patient which prohibited therapy  ___ Treatment cancelled today due to patient receiving procedure or test   _x__ Treatment cancelled today due to patient's refusal to participate. Pt stated he had just finished working with PT and requested OT check back on 06/17/11.   ___ Treatment cancelled today due to   Signature: Garrel Ridgel, OTR/L  Pager [phone] 06/16/2011

## 2011-06-16 NOTE — Progress Notes (Signed)
Clinical Social Work Department BRIEF PSYCHOSOCIAL ASSESSMENT 06/16/2011  Patient:  [name], [name]     Account Number:  [id number]     Admit date:  06/12/2011  Clinical Social Worker:  Mee Hives  Date/Time:  06/16/2011 10:00 AM  Referred by:  Care Management  Date Referred:  06/13/2011 Referred for  SNF Placement  Homelessness   Other Referral:   Interview type:  Patient Other interview type:    PSYCHOSOCIAL DATA Living Status:  OTHER Admitted from facility:   Level of care:   Primary support name:  none Primary support relationship to patient:   Degree of support available:   Pt is homeless, has been staying with his neice, but unable to do so at time of d/c. Pt has no other family or support available.    CURRENT CONCERNS Current Concerns  Post-Acute Placement  Financial Resources  Substance Abuse  Adjustment to Illness  Other - See comment   Other Concerns:   Pt requesting assistance with Medicaid application.Referred to Financial Counseling - Arline Asp 5/13  Pt does not have PCP and will require follow-up. Referred to [facility]    SOCIAL WORK ASSESSMENT / PLAN Pt awakened for interview. Cooperative but drowsy, making occasional eye contact with CSW but speaking mostly with eyes closed. Pt discoses information openly. Pt is s/p VATS and lobectomy for lung mass. Per pt report he currently needs assistance for transfers and ambulations. Pt was independent with ADL's PTA.    Pt states he has no safe disposition at time of d/c; w as staying with his neice but is unable to return to her home at d/c for multiple reasons. Pt states he stayed at the shelter in [place] approximately 3 months ago and at the shelter in [place] 2-3 years ago.    Pt endorses history of cocaine abuse, stating he attended inpatient rehab at [facility] approximately 1 year ago and had one relapse a few weeks ago. 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 Pt currently taking Wellbutrin  and Trazadone for depression and insomnia, which was prescribed by [facility], per pt report.    Pt has no social support, recieves only SNAP.   Assessment/plan status:  Other - See comment Other assessment/ plan:   CSW has made referrals to financial counseling for Medicaid application. Per Arline Asp, ? eligibility for disability.    CSW has referred to [facility] for PT/OT orders to determine eligibility for SNF and to address PCP issues.    Disposition: SNF or [facility], as determined by PT and Medicaid status   Information/referral to community resources:    PATIENT'S/FAMILY'S RESPONSE TO PLAN OF CARE: See above, pt receptive to intervention and appreciative of CSW assistance.

## 2011-06-17 MED ORDER — LACTULOSE 10 GM/15ML PO SOLN
10.0000 g | Freq: Once | ORAL | Status: AC
  Administered 2011-06-17: 10 g via ORAL
  Filled 2011-06-17: qty 15

## 2011-06-17 MED ORDER — TRAMADOL HCL 50 MG PO TABS
50.0000 mg | ORAL_TABLET | Freq: Four times a day (QID) | ORAL | Status: DC | PRN
  Administered 2011-06-17: 50 mg via ORAL
  Filled 2011-06-17: qty 1

## 2011-06-17 NOTE — Progress Notes (Signed)
Physical Therapy Treatment Patient Details Name: [name] MRN: [id number] DOB: 24-Aug-1955 Today's Date: 06/17/2011 Time: 7829-5621 PT Time Calculation (min): 30 min  PT Assessment / Plan / Recommendation Comments on Treatment Session  Pt s/p VATS and lobectomy with continued decreased oxygen saturation with ambulation with drop to 84% on 4L. Pt 90% at rest on 3L and 84-88% with ambulation on 4L with return to 91 at rest on 3L HR 57-64 with activity. Pt encouraged to continue ambulating and using IS. Pt with improved balance but not yet to baseline.     Follow Up Recommendations  No PT follow up    Barriers to Discharge        Equipment Recommendations  None recommended by PT    Recommendations for Other Services    Frequency     Plan Discharge plan needs to be updated    Precautions / Restrictions Precautions Precautions: Fall Precaution Comments: oxygen   Pertinent Vitals/Pain 5/10 pain premedicated    Mobility  Bed Mobility Bed Mobility: Rolling Right;Right Sidelying to Sit Rolling Right: 6: Modified independent (Device/Increase time) Right Sidelying to Sit: 6: Modified independent (Device/Increase time) Transfers Transfers: Sit to Stand;Stand to Sit;Stand Pivot Transfers Sit to Stand: 7: Independent;From chair/3-in-1;From bed Stand to Sit: 7: Independent;To bed;To chair/3-in-1 Stand Pivot Transfers: 6: Modified independent (Device/Increase time) Ambulation/Gait Ambulation/Gait Assistance: 6: Modified independent (Device/Increase time) Ambulation Distance (Feet): 400 Feet Assistive device: None Ambulation/Gait Assistance Details: pt with steady gait today and no deviations although continued decreased stride and velocity Gait Pattern: Step-through pattern;Decreased stride length Stairs: Yes Stairs Assistance: 6: Modified independent (Device/Increase time) Stair Management Technique: One rail Left Number of Stairs: 11     Exercises     PT Diagnosis:    PT  Problem List:   PT Treatment Interventions:     PT Goals Acute Rehab PT Goals PT Goal: Supine/Side to Sit - Progress: Met PT Goal: Sit to Stand - Progress: Met PT Goal: Stand to Sit - Progress: Met PT Goal: Ambulate - Progress: Progressing toward goal PT Goal: Up/Down Stairs - Progress: Met Additional Goals PT Goal: Additional Goal #1 - Progress: Progressing toward goal  Visit Information  Last PT Received On: 06/17/11 Assistance Needed: +1    Subjective Data  Subjective: I have to do that 10 times (incentive spirometer)   Cognition  Overall Cognitive Status: Appears within functional limits for tasks assessed/performed Arousal/Alertness: Awake/alert Orientation Level: Appears intact for tasks assessed Behavior During Session: [facility] for tasks performed    Balance  High Level Balance High Level Balance Activites: Side stepping;Braiding;Backward walking High Level Balance Comments: pt able to complete bil side stepping and braiding without LOB, no difficulty with backward walking, able to maintain SLS 12 sec bil LE, sharpened romberg 30 sec and romberg eyes closed 30 sec. Continued difficulty with turning over shoulder only able to turn sideways but not fully back and turning 360degrees in 6sec  End of Session PT - End of Session Equipment Utilized During Treatment: Gait belt Activity Tolerance: Patient tolerated treatment well Patient left: in chair;with call bell/phone within reach Nurse Communication: Mobility status    Delorse Lek 06/17/2011, 10:00 AM Delaney Meigs, PT [phone]

## 2011-06-17 NOTE — Progress Notes (Signed)
5 Days Post-Op Procedure(s) (LRB): VIDEO BRONCHOSCOPY (N/A) VIDEO ASSISTED THORACOSCOPY (VATS)/THOROCOTOMY (Left) THOROCOTOMY WITH LOBECTOMY (Left) Subjective: C/O pain, constipated  Objective: Vital signs in last 24 hours: Temp:  [97.9 F (36.6 C)-98.6 F (37 C)] 97.9 F (36.6 C) (05/14 0556) Pulse Rate:  [45-67] 45  (05/14 0556) Cardiac Rhythm:  [-] Sinus bradycardia (05/13 2000) Resp:  [16-18] 16  (05/14 0556) BP: (104-116)/(68-72) 106/70 mmHg (05/14 0556) SpO2:  [93 %-96 %] 94 % (05/14 0556)  Hemodynamic parameters for last 24 hours:    Intake/Output from previous day: 05/13 0701 - 05/14 0700 In: 960 [P.O.:960] Out: 3000 [Urine:3000] Intake/Output this shift: Total I/O In: 660 [P.O.:660] Out: -   General appearance: alert and cooperative Heart: regular rate and rhythm, S1, S2 normal, no murmur, click, rub or gallop Lungs: clear to auscultation bilaterally Abdomen: soft, non-tender; bowel sounds normal; no masses,  no organomegaly Wound: incision ok  Lab Results: No results found for this basename: WBC:2,HGB:2,HCT:2,PLT:2 in the last 72 hours BMET: No results found for this basename: NA:2,K:2,CL:2,CO2:2,GLUCOSE:2,BUN:2,CREATININE:2,CALCIUM:2 in the last 72 hours  PT/INR: No results found for this basename: LABPROT,INR in the last 72 hours ABG    Component Value Date/Time   PHART 7.331* 06/13/2011 0332   HCO3 26.6* 06/13/2011 0332   TCO2 28 06/13/2011 0332   ACIDBASEDEF 0.2 06/10/2011 1500   O2SAT 88.0 06/13/2011 0332   CBG (last 3)  No results found for this basename: GLUCAP:3 in the last 72 hours  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  Discussed with patient. Assessment/Plan: S/P Procedure(s) (LRB): VIDEO BRONCHOSCOPY (N/A) VIDEO ASSISTED THORACOSCOPY (VATS)/THOROCOTOMY (Left) THOROCOTOMY WITH LOBECTOMY (Left) Mobilize continue IS Laxative. Wean oxygen off Placement in progress   LOS: 5 days    Russell Engelstad  K 06/17/2011

## 2011-06-17 NOTE — Progress Notes (Addendum)
Pharmacy: RN requested review of percocet and bradycardia  Percocet administration   5/10: 2 tabs at ~1200 5/11: 2 tabs at 1300 5/12: taken an ~ 0600, 0830, 2000 (1-2 tabs) 5/13: taken at 0600, 1000, 1730, 2200 (1-2 tabs)  HR daily range 5/9: 41-49 5/10: 47-117 5/11: 51-79 5/12: 51-72 5/13: 56-67 5/14: 45  Percocet is not often associated with bradycardia.  It is also noted that the bradycardia occurred prior to percocet administration. The patient has tolerated 1-2 tabs on multiple occasions (although timing of percocet administration and HR trends are difficult to follow). Hypotension is a more common cardiovascular adverse effect and the patient has had some soft blood pressures noted before and after percocet was started. There are no other medications currently on the profile thought to be contributing.    I spoke with the RN and suggested continuing with the percocet as this is not likely contributing to the low heart rate.   Thank you, Harland German, Pharm D 06/17/2011 9:03 AM

## 2011-06-17 NOTE — Progress Notes (Signed)
Pt's states he is in pain, stated pain level was 6 on the Left flank. Night RN stated that after giving pt Pain Medication last night pt's heart rate dropped from 60 to 30-40s. Pt's heart rate currently is 45. Spoke with PA concerning pt's pain and heart rate. New medication orders given, new pain medication given see MAR. Will continue to monitor.

## 2011-06-17 NOTE — Evaluation (Signed)
Occupational Therapy Evaluation Patient Details Name: [name] MRN: [id number] DOB: [birth year] Today's Date: 06/17/2011 Time: 3086-5784 OT Time Calculation (min): 19 min  OT Assessment / Plan / Recommendation Clinical Impression  Pt s/p VATS and lobectomy. Pt demo's limited activity tolerance. Educated on energy conservation strategies. Pt mod I/I with all ADLs.    OT Assessment  Patient does not need any further OT services    Follow Up Recommendations  No OT follow up    Barriers to Discharge      Equipment Recommendations  None recommended by OT    Recommendations for Other Services    Frequency       Precautions / Restrictions Precautions Precautions: Fall Precaution Comments: oxygen   Pertinent Vitals/Pain Pt desaturated to 88% following ambulation to bathroom and grooming at the sink. Increased to  94%  With rest and PLB.   ADL  Grooming: Performed;Wash/dry hands;Modified independent Where Assessed - Grooming: Standing at sink Upper Body Bathing: Simulated;Modified independent Where Assessed - Upper Body Bathing: Standing at sink Lower Body Bathing: Simulated;Modified independent Where Assessed - Lower Body Bathing: Sit to stand from chair Upper Body Dressing: Simulated;Modified independent Where Assessed - Upper Body Dressing: Standing Lower Body Dressing: Performed;Modified independent Where Assessed - Lower Body Dressing: Sitting, chair;Unsupported Toilet Transfer: Performed;Modified independent Toilet Transfer Method: Proofreader: Regular height toilet Toileting - Clothing Manipulation: Performed;Modified independent Where Assessed - Toileting Clothing Manipulation: Sit to stand from 3-in-1 or toilet Toileting - Hygiene: Performed;Modified independent Where Assessed - Toileting Hygiene: Sit to stand from 3-in-1 or toilet Tub/Shower Transfer: Simulated;Modified independent Tub/Shower Transfer Method: Ambulating Ambulation  Related to ADLs: Pt ambulated to the bathroom independently with no LOB. O2 saturation dropped to 88% following activity on 4L of O2. Increased to 94% following rest and PLB. ADL Comments: Pt requires increased time and rest breaks throughout. Educated in several energy conservation strategies. Provided handout.    OT Diagnosis:    OT Problem List:   OT Treatment Interventions:     OT Goals    Visit Information  Last OT Received On: 06/17/11 Assistance Needed: +1    Subjective Data      Prior Functioning  Home Living Lives With: Alone Type of Home: Homeless Home Adaptive Equipment: None Prior Function Level of Independence: Independent Able to Take Stairs?: Yes Driving: No Vocation: Unemployed Communication Communication: No difficulties    Cognition  Overall Cognitive Status: Appears within functional limits for tasks assessed/performed Arousal/Alertness: Awake/alert Orientation Level: Appears intact for tasks assessed Behavior During Session: [facility] for tasks performed    Extremity/Trunk Assessment Right Upper Extremity Assessment RUE ROM/Strength/Tone: Within functional levels Left Upper Extremity Assessment LUE ROM/Strength/Tone: Within functional levels   Mobility Bed Mobility Bed Mobility: Rolling Right;Right Sidelying to Sit Rolling Right: 6: Modified independent (Device/Increase time) Right Sidelying to Sit: 6: Modified independent (Device/Increase time) Transfers Sit to Stand: 7: Independent;From chair/3-in-1;From toilet Stand to Sit: 7: Independent;To chair/3-in-1;To toilet   Exercise    Balance   End of Session OT - End of Session Activity Tolerance: Patient tolerated treatment well Patient left: in chair;with call bell/phone within reach   Porfirio Bollier A OTR/L [phone] 06/17/2011, 10:52 AM

## 2011-06-18 MED ORDER — PANTOPRAZOLE SODIUM 40 MG PO PACK
40.0000 mg | PACK | Freq: Every day | ORAL | Status: DC
  Administered 2011-06-18 – 2011-06-20 (×3): 40 mg via ORAL
  Filled 2011-06-18 (×3): qty 20

## 2011-06-18 NOTE — Progress Notes (Signed)
Referral made to [facility] to see if he may be a good candidate for the housing assistance and community resource linking that this program provides. Anticipate update later this morning- Reece Levy, MSW, Theresia Majors [phone]

## 2011-06-18 NOTE — Progress Notes (Signed)
Physical Therapy Treatment Patient Details Name: [name] MRN: [id number] DOB: August 03, 1955 Today's Date: 06/18/2011 Time: 4098-1191 PT Time Calculation (min): 29 min  PT Assessment / Plan / Recommendation Comments on Treatment Session  Pt able to tolerate decreased O2 (1L) but unable to come off O2, deaturation to 79%. Pt also not having an appropriate HR response to exercise:  HR before ambulation 45 bpm, after 49 BPM. Pt still with 8/10 left sided pain whic is affecting mobility. Balance is improving slowly.  PT will continue to follow.    Follow Up Recommendations  No PT follow up    Barriers to Discharge        Equipment Recommendations  None recommended by OT;None recommended by PT    Recommendations for Other Services    Frequency Min 3X/week   Plan Discharge plan remains appropriate;Frequency remains appropriate    Precautions / Restrictions Precautions Precautions: Fall Precaution Comments: oxygen Restrictions Weight Bearing Restrictions: No Other Position/Activity Restrictions: Off O2, pt desaturated to 79%, on 1 L, pt returned to 93%. Therefore, kept on O2 for ambulation   Pertinent Vitals/Pain 8/10 left sided pain O2 sats on 1L 96%                    RA 79%                    1L after ambulation 93% HR49    Mobility  Bed Mobility Bed Mobility: Supine to Sit Supine to Sit: 7: Independent Transfers Transfers: Sit to Stand;Stand to Sit Sit to Stand: 7: Independent;From toilet;From bed Stand to Sit: 7: Independent;To toilet;To chair/3-in-1 Ambulation/Gait Ambulation/Gait Assistance: 5: Supervision Ambulation Distance (Feet): 400 Feet Assistive device: None Ambulation/Gait Assistance Details: pt with 1 LOB to left with self-correction, balance seems at least partially affected by L chest pain. Pt able to change gait speeds today without LOB, reports occasional dizziness Gait Pattern: Narrow base of support Gait velocity: 2.52 ft/sec, sufficient for  neighborhood ambulation Stairs: No Wheelchair Mobility Wheelchair Mobility: No         PT Goals Acute Rehab PT Goals PT Goal Formulation: With patient Time For Goal Achievement: 06/23/11 Potential to Achieve Goals: Good Pt will go Supine/Side to Sit: with modified independence PT Goal: Supine/Side to Sit - Progress: Met Pt will go Sit to Supine/Side: with modified independence PT Goal: Sit to Supine/Side - Progress: Met Pt will go Sit to Stand: Independently PT Goal: Sit to Stand - Progress: Met Pt will go Stand to Sit: Independently PT Goal: Stand to Sit - Progress: Met Pt will Ambulate: >150 feet;with modified independence;Other (comment) PT Goal: Ambulate - Progress: Progressing toward goal Pt will Go Up / Down Stairs: Flight;with rail(s);with modified independence PT Goal: Up/Down Stairs - Progress: Progressing toward goal Additional Goals Additional Goal #1: Pt will score > or equal to 51 on Berg assessment to decrease fall risk PT Goal: Additional Goal #1 - Progress: Progressing toward goal  Visit Information  Last PT Received On: 06/18/11 Assistance Needed: +1    Subjective Data  Subjective: I wish I had never had this surgery Patient Stated Goal: decide where to go from here   Cognition  Overall Cognitive Status: Appears within functional limits for tasks assessed/performed Arousal/Alertness: Awake/alert Orientation Level: Appears intact for tasks assessed Behavior During Session: [facility] for tasks performed    Balance  Balance Balance Assessed: Yes High Level Balance High Level Balance Activites: Direction changes;Other (comment) (pace changes) High Level Balance  Comments: see above gait note  End of Session PT - End of Session Equipment Utilized During Treatment: Gait belt Activity Tolerance: Other (comment) (pt limited by O2 desat until O2 reapplied) Patient left: in chair;with call bell/phone within reach Nurse Communication: Mobility status   Lyanne Co, PT  Acute Rehab Services  [phone]  Lyanne Co 06/18/2011, 3:59 PM

## 2011-06-18 NOTE — Progress Notes (Addendum)
6 Days Post-Op Procedure(s) (LRB): VIDEO BRONCHOSCOPY (N/A) VIDEO ASSISTED THORACOSCOPY (VATS)/THOROCOTOMY (Left) THOROCOTOMY WITH LOBECTOMY (Left) Subjective:  [name] continues to complain of pain on his left side.  He states he is using the Percocet sometimes, however he does not take it on an empty stomach due to nausea.  The patient also complains of heartburn.   Objective: Vital signs in last 24 hours: Temp:  [97.6 F (36.4 C)-98.4 F (36.9 C)] 98.4 F (36.9 C) (05/15 0458) Pulse Rate:  [47-64] 47  (05/15 0458) Cardiac Rhythm:  [-] Sinus bradycardia (05/14 1950) Resp:  [18] 18  (05/15 0458) BP: (114-125)/(74-82) 125/82 mmHg (05/15 0458) SpO2:  [90 %-96 %] 95 % (05/15 0458)  Intake/Output from previous day: 05/14 0701 - 05/15 0700 In: 2040 [P.O.:2040] Out: 501 [Urine:500; Stool:1]    General appearance: alert, cooperative and no distress Heart: regular rate and rhythm Lungs: clear to auscultation bilaterally Abdomen: soft, non-tender; bowel sounds normal; no masses,  no organomegaly Wound: clean and dry  Lab Results: No results found for this basename: WBC:2,HGB:2,HCT:2,PLT:2 in the last 72 hours BMET: No results found for this basename: NA:2,K:2,CL:2,CO2:2,GLUCOSE:2,BUN:2,CREATININE:2,CALCIUM:2 in the last 72 hours  PT/INR: No results found for this basename: LABPROT,INR in the last 72 hours ABG    Component Value Date/Time   PHART 7.331* 06/13/2011 0332   HCO3 26.6* 06/13/2011 0332   TCO2 28 06/13/2011 0332   ACIDBASEDEF 0.2 06/10/2011 1500   O2SAT 88.0 06/13/2011 0332   CBG (last 3)  No results found for this basename: GLUCAP:3 in the last 72 hours  Assessment/Plan: S/P Procedure(s) (LRB): VIDEO BRONCHOSCOPY (N/A) VIDEO ASSISTED THORACOSCOPY (VATS)/THOROCOTOMY (Left) THOROCOTOMY WITH LOBECTOMY (Left)  2. Pain Control- on  Percocet, encouraged use of Tramadol when patient has not eaten 3. Resp- wean off oxygen as tolerated 4. Heartburn- patient currently on  protonix, will add Maalox prn 5. Dispo- patient is ready for d/c, however currently the patient is homeless, placement is being arranged    LOS: 6 days    [name] 06/18/2011    Chart reviewed, patient examined, agree with above. He still has significant pain and requiring 1L O2 to maintain sats. Will check CXR in am.

## 2011-06-19 ENCOUNTER — Inpatient Hospital Stay (HOSPITAL_COMMUNITY): Payer: Medicaid Other

## 2011-06-19 MED ORDER — OXYCODONE-ACETAMINOPHEN 5-325 MG PO TABS: 1.0000 | ORAL_TABLET | ORAL | Status: DC | PRN

## 2011-06-19 NOTE — Progress Notes (Signed)
Physical Therapy Treatment Patient Details Name: [name] MRN: [id number] DOB: Jul 22, 1955 Today's Date: 06/19/2011 Time: 1040-1105 PT Time Calculation (min): 25 min  PT Assessment / Plan / Recommendation Comments on Treatment Session  Pt s/p lobectomy and VATS continues to need oxygen for activity. On 1L at rest sats 90%, with ambulation on RA down to 82% and on 2L with ambulation 96%. Pt encouraged to continue ambulation and incentive spirometer use. Pt agreeable to no further needs as all goals met.     Follow Up Recommendations  No PT follow up    Barriers to Discharge        Equipment Recommendations       Recommendations for Other Services    Frequency     Plan All goals met and education completed, patient dischaged from PT services    Precautions / Restrictions Precautions Precautions: Fall Precaution Comments: oxygen Restrictions Weight Bearing Restrictions: No   Pertinent Vitals/Pain See note Pain 6/10 throughout, premedicated    Mobility  Bed Mobility Bed Mobility: Supine to Sit Supine to Sit: 7: Independent Transfers Transfers: Sit to Stand;Stand to Sit;Stand Pivot Transfers Sit to Stand: 7: Independent;From chair/3-in-1;From bed Stand to Sit: 7: Independent;To bed;To chair/3-in-1 Stand Pivot Transfers: 7: Independent Ambulation/Gait Ambulation/Gait Assistance: 6: Modified independent (Device/Increase time) Ambulation Distance (Feet): 500 Feet Assistive device: None Ambulation/Gait Assistance Details: no LOB Gait Pattern: Within Functional Limits Gait velocity: 19ft/14sec=2.14 sufficient for home and neighborhood ambulation Stairs: No    Exercises     PT Diagnosis:    PT Problem List:   PT Treatment Interventions:     PT Goals Acute Rehab PT Goals PT Goal: Ambulate - Progress: Met PT Goal: Up/Down Stairs - Progress: Met Additional Goals PT Goal: Additional Goal #1 - Progress: Met  Visit Information  Last PT Received On:  06/19/11 Assistance Needed: +1    Subjective Data  Subjective: it just keeps hurting on my side   Cognition  Overall Cognitive Status: Appears within functional limits for tasks assessed/performed Arousal/Alertness: Awake/alert Orientation Level: Appears intact for tasks assessed Behavior During Session: [facility] for tasks performed    Balance  Standardized Balance Assessment Standardized Balance Assessment: Berg Balance Test Berg Balance Test Sit to Stand: Able to stand without using hands and stabilize independently Standing Unsupported: Able to stand safely 2 minutes Sitting with Back Unsupported but Feet Supported on Floor or Stool: Able to sit safely and securely 2 minutes Stand to Sit: Sits safely with minimal use of hands Transfers: Able to transfer safely, minor use of hands Standing Unsupported with Eyes Closed: Able to stand 10 seconds safely Standing Ubsupported with Feet Together: Able to place feet together independently and stand 1 minute safely From Standing, Reach Forward with Outstretched Arm: Can reach confidently >25 cm (10") From Standing Position, Pick up Object from Floor: Able to pick up shoe safely and easily From Standing Position, Turn to Look Behind Over each Shoulder: Looks behind one side only/other side shows less weight shift Turn 360 Degrees: Able to turn 360 degrees safely one side only in 4 seconds or less Standing Unsupported, Alternately Place Feet on Step/Stool: Able to stand independently and safely and complete 8 steps in 20 seconds Standing Unsupported, One Foot in Front: Able to place foot tandem independently and hold 30 seconds Standing on One Leg: Able to lift leg independently and hold equal to or more than 3 seconds Total Score: 52   End of Session PT - End of Session Activity Tolerance: Patient  tolerated treatment well Patient left: in chair;with call bell/phone within reach    Delorse Lek 06/19/2011, 11:10 AM Delaney Meigs,  PT [phone]

## 2011-06-19 NOTE — Progress Notes (Signed)
pts O2 sat was 97%on 1.5L/[place].  Decreased O2 to .5L/[place] and rechecked -  95% on .5L/[place].  Was then put on RA and rechecked and found to be 94% on RA at this time.  All of these findings were taken at rest.  Will cont. To monitor.

## 2011-06-19 NOTE — Progress Notes (Addendum)
                    [address]            [place] [address]          [phone]     7 Days Post-Op Procedure(s) (LRB): VIDEO BRONCHOSCOPY (N/A) VIDEO ASSISTED THORACOSCOPY (VATS)/THOROCOTOMY (Left) THOROCOTOMY WITH LOBECTOMY (Left)  Subjective: Pain better controlled today.  Breathing stable.  Objective: Vital signs in last 24 hours: Patient Vitals for the past 24 hrs:  BP Temp Temp src Pulse Resp SpO2  06/19/11 0442 112/72 mmHg 97.3 F (36.3 C) Oral 45  18  95 %  06/18/11 2047 130/83 mmHg 98.4 F (36.9 C) Oral 50  18  97 %  06/18/11 1545 - - - 45  - 79 %  06/18/11 1500 - - - - - 96 %  06/18/11 1336 119/78 mmHg 97.1 F (36.2 C) Oral 45  18  94 %   Current Weight  06/15/11 133 lb 6.1 oz (60.5 kg)     Intake/Output from previous day: 05/15 0701 - 05/16 0700 In: 1080 [P.O.:1080] Out: -     PHYSICAL EXAM:  Heart: RRR Lungs: clear Wound: clean and dry   Chest x-ray: stable, tiny L effusion, no obvious ptx  Assessment/Plan: S/P Procedure(s) (LRB): VIDEO BRONCHOSCOPY (N/A) VIDEO ASSISTED THORACOSCOPY (VATS)/THOROCOTOMY (Left) THOROCOTOMY WITH LOBECTOMY (Left) Pulm- still on 1.5L O2.  Will wean and d/c today. Continue ambulation, pulm toilet. Disp pending- SW arranging assistance.  Hopefully for d/c soon.   LOS: 7 days    COLLINS,GINA H 06/19/2011    Chart reviewed, patient examined, agree with above.

## 2011-06-19 NOTE — Progress Notes (Signed)
Patient has been approved for the [facility] project and will be set up in a motel setting for at least 30 days with medication and food assistance as well as Charity fundraiser and SW assistance. These arrangements are being coordinated for d/c tomorrow- discussed with Almira Coaster, PA. Reece Levy, MSW, Theresia Majors [phone]

## 2011-06-20 DIAGNOSIS — Z9889 Other specified postprocedural states: Secondary | ICD-10-CM

## 2011-06-20 MED ORDER — TRAZODONE HCL 100 MG PO TABS: 100.0000 mg | ORAL_TABLET | Freq: Every day | ORAL | Status: DC

## 2011-06-20 MED ORDER — BUPROPION HCL ER (SR) 100 MG PO TB12: 100.0000 mg | ORAL_TABLET | Freq: Two times a day (BID) | ORAL | Status: DC

## 2011-06-20 MED ORDER — PRAVASTATIN SODIUM 40 MG PO TABS: 40.0000 mg | ORAL_TABLET | Freq: Every day | ORAL | Status: DC
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
## 2011-06-20 NOTE — Progress Notes (Signed)
8 Days Post-Op Procedure(s) (LRB): VIDEO BRONCHOSCOPY (N/A) VIDEO ASSISTED THORACOSCOPY (VATS)/THOROCOTOMY (Left) THOROCOTOMY WITH LOBECTOMY (Left)  Subjective:  [name] has no new complaints this morning.    Objective: Vital signs in last 24 hours: Temp:  [98.2 F (36.8 C)-98.5 F (36.9 C)] 98.2 F (36.8 C) (05/17 0422) Pulse Rate:  [48-90] 48  (05/17 0422) Cardiac Rhythm:  [-] Normal sinus rhythm;Sinus bradycardia;Other (Comment) (05/16 1945) Resp:  [18] 18  (05/17 0422) BP: (92-103)/(55-71) 103/71 mmHg (05/17 0422) SpO2:  [82 %-95 %] 92 % (05/17 0422)  Intake/Output from previous day: 05/16 0701 - 05/17 0700 In: 1680 [P.O.:1680] Out: 100 [Urine:100] Intake/Output this shift:  General appearance: alert, cooperative and no distress Neurologic: intact Heart: regular rate and rhythm Lungs: clear to auscultation bilaterally Abdomen: soft, non-tender; bowel sounds normal; no masses,  no organomegaly Wound: clean and dry  Lab Results: No results found for this basename: WBC:2,HGB:2,HCT:2,PLT:2 in the last 72 hours BMET: No results found for this basename: NA:2,K:2,CL:2,CO2:2,GLUCOSE:2,BUN:2,CREATININE:2,CALCIUM:2 in the last 72 hours  PT/INR: No results found for this basename: LABPROT,INR in the last 72 hours ABG    Component Value Date/Time   PHART 7.331* 06/13/2011 0332   HCO3 26.6* 06/13/2011 0332   TCO2 28 06/13/2011 0332   ACIDBASEDEF 0.2 06/10/2011 1500   O2SAT 88.0 06/13/2011 0332   CBG (last 3)  No results found for this basename: GLUCAP:3 in the last 72 hours  Assessment/Plan: S/P Procedure(s) (LRB): VIDEO BRONCHOSCOPY (N/A) VIDEO ASSISTED THORACOSCOPY (VATS)/THOROCOTOMY (Left) THOROCOTOMY WITH LOBECTOMY (Left)  2. Resp- off oxygen, will need to continue IS at discharge 3. Dispo- patient doing well, he has been approved for [employer] and discharge has been arranged for today   LOS: 8 days    Raford Pitcher, Denny Peon 06/20/2011

## 2011-06-23 ENCOUNTER — Other Ambulatory Visit: Payer: Self-pay | Admitting: *Deleted

## 2011-06-23 DIAGNOSIS — G8918 Other acute postprocedural pain: Secondary | ICD-10-CM

## 2011-06-23 MED ORDER — HYDROCODONE-ACETAMINOPHEN 7.5-500 MG PO TABS: 1.0000 | ORAL_TABLET | Freq: Four times a day (QID) | ORAL | Status: DC | PRN

## 2011-06-26 ENCOUNTER — Encounter (HOSPITAL_COMMUNITY): Payer: Self-pay | Admitting: *Deleted

## 2011-06-26 ENCOUNTER — Emergency Department (HOSPITAL_COMMUNITY)
Admission: EM | Admit: 2011-06-26 | Discharge: 2011-06-26 | Disposition: A | Discharge status: Home / Self Care | Payer: Self-pay | Attending: Emergency Medicine | Admitting: Emergency Medicine

## 2011-06-26 ENCOUNTER — Emergency Department (HOSPITAL_COMMUNITY): Payer: Self-pay

## 2011-06-26 DIAGNOSIS — J438 Other emphysema: Secondary | ICD-10-CM | POA: Insufficient documentation

## 2011-06-26 DIAGNOSIS — Z7982 Long term (current) use of aspirin: Secondary | ICD-10-CM | POA: Insufficient documentation

## 2011-06-26 DIAGNOSIS — G4700 Insomnia, unspecified: Secondary | ICD-10-CM | POA: Insufficient documentation

## 2011-06-26 DIAGNOSIS — I498 Other specified cardiac arrhythmias: Secondary | ICD-10-CM | POA: Insufficient documentation

## 2011-06-26 DIAGNOSIS — E785 Hyperlipidemia, unspecified: Secondary | ICD-10-CM | POA: Insufficient documentation

## 2011-06-26 DIAGNOSIS — R071 Chest pain on breathing: Secondary | ICD-10-CM | POA: Insufficient documentation

## 2011-06-26 DIAGNOSIS — K219 Gastro-esophageal reflux disease without esophagitis: Secondary | ICD-10-CM | POA: Insufficient documentation

## 2011-06-26 DIAGNOSIS — F329 Major depressive disorder, single episode, unspecified: Secondary | ICD-10-CM | POA: Insufficient documentation

## 2011-06-26 DIAGNOSIS — R0789 Other chest pain: Secondary | ICD-10-CM

## 2011-06-26 DIAGNOSIS — F3289 Other specified depressive episodes: Secondary | ICD-10-CM | POA: Insufficient documentation

## 2011-06-26 DIAGNOSIS — C3490 Malignant neoplasm of unspecified part of unspecified bronchus or lung: Secondary | ICD-10-CM | POA: Insufficient documentation

## 2011-06-26 DIAGNOSIS — F17200 Nicotine dependence, unspecified, uncomplicated: Secondary | ICD-10-CM | POA: Insufficient documentation

## 2011-06-26 LAB — POCT I-STAT, CHEM 8
BUN: 13 mg/dL (ref 6–23)
Calcium, Ion: 1.23 mmol/L (ref 1.12–1.32)
Chloride: 107 mEq/L (ref 96–112)
Creatinine, Ser: 1.2 mg/dL (ref 0.50–1.35)
Glucose, Bld: 86 mg/dL (ref 70–99)
HCT: 41 % (ref 39.0–52.0)
Hemoglobin: 13.9 g/dL (ref 13.0–17.0)
Potassium: 4.5 mEq/L (ref 3.5–5.1)
Sodium: 140 mEq/L (ref 135–145)
TCO2: 26 mmol/L (ref 0–100)

## 2011-06-26 LAB — POCT I-STAT TROPONIN I: Troponin i, poc: 0 ng/mL (ref 0.00–0.08)

## 2011-06-26 MED ORDER — OXYCODONE-ACETAMINOPHEN 5-325 MG PO TABS: 1.0000 | ORAL_TABLET | ORAL | Status: AC | PRN

## 2011-06-26 MED ORDER — OXYCODONE HCL 5 MG PO TABS
10.0000 mg | ORAL_TABLET | Freq: Once | ORAL | Status: AC
  Administered 2011-06-26: 10 mg via ORAL
  Filled 2011-06-26: qty 2

## 2011-06-26 NOTE — Discharge Instructions (Signed)
Your chest pain is due to your recent surgery for removal of lung cancer.  As informed by your Cardiothoracic Surgeon, the pain may take several weeks to go away.  Your chest x-ray and labs today were stable.
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
## 2011-06-26 NOTE — ED Notes (Signed)
Pt had some of left lung removed one week ago and now with left sided chest pain.  Increased sob.  Pt in no distress at this time

## 2011-06-26 NOTE — ED Notes (Signed)
Discharged home with prescription and written and verbal instruction.  Understand need to follow up with surgeon and family doctor.  No questions or concerns at discharge.

## 2011-06-26 NOTE — ED Notes (Signed)
Meal tray provided to patient.

## 2011-07-02 ENCOUNTER — Encounter: Payer: Self-pay | Admitting: Cardiovascular Disease

## 2011-07-02 ENCOUNTER — Encounter: Payer: Self-pay | Admitting: Physician Assistant

## 2011-07-02 ENCOUNTER — Ambulatory Visit (INDEPENDENT_AMBULATORY_CARE_PROVIDER_SITE_OTHER): Payer: Self-pay | Admitting: Physician Assistant

## 2011-07-02 ENCOUNTER — Encounter: Payer: Self-pay | Admitting: Nurse Practitioner

## 2011-07-02 VITALS — BP 110/70 | HR 47 | Ht 67.0 in | Wt 128.0 lb

## 2011-07-02 DIAGNOSIS — C3490 Malignant neoplasm of unspecified part of unspecified bronchus or lung: Secondary | ICD-10-CM

## 2011-07-02 DIAGNOSIS — I2510 Atherosclerotic heart disease of native coronary artery without angina pectoris: Secondary | ICD-10-CM

## 2011-07-02 DIAGNOSIS — I498 Other specified cardiac arrhythmias: Secondary | ICD-10-CM

## 2011-07-02 DIAGNOSIS — R001 Bradycardia, unspecified: Secondary | ICD-10-CM

## 2011-07-02 NOTE — Patient Instructions (Addendum)
Call Dr. Sharee Pimple office for refills on pain medications for pain from your surgery. [address] [place]. [address] [place], [place] [address] Telephone [phone] Fax [phone]   Your physician wants you to follow-up in: 6-9 MONTHS WITH DR. Kirke Corin. You will receive a reminder letter in the mail two months in advance. If you don't receive a letter, please call our office to schedule the follow-up appointment.   Your physician recommends that you return for lab work in: 09/05/11 FOR A FASTING LIPID AND LIVER PANEL, PLEASE CALL us IF YOU CAN GET THESE DONE @ [facility]. [phone]
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
## 2011-07-03 ENCOUNTER — Encounter: Payer: Self-pay | Admitting: Adult Health

## 2011-07-03 ENCOUNTER — Ambulatory Visit (INDEPENDENT_AMBULATORY_CARE_PROVIDER_SITE_OTHER)
Admission: RE | Admit: 2011-07-03 | Discharge: 2011-07-03 | Disposition: A | Discharge status: Home / Self Care | Payer: Self-pay | Source: Ambulatory Visit | Attending: Adult Health | Admitting: Adult Health

## 2011-07-03 ENCOUNTER — Ambulatory Visit (INDEPENDENT_AMBULATORY_CARE_PROVIDER_SITE_OTHER): Payer: Self-pay | Admitting: Adult Health

## 2011-07-03 VITALS — BP 110/80 | HR 53 | Temp 96.2°F | Ht 67.0 in | Wt 128.8 lb

## 2011-07-03 DIAGNOSIS — C3490 Malignant neoplasm of unspecified part of unspecified bronchus or lung: Secondary | ICD-10-CM

## 2011-07-03 DIAGNOSIS — R222 Localized swelling, mass and lump, trunk: Secondary | ICD-10-CM

## 2011-07-03 DIAGNOSIS — R918 Other nonspecific abnormal finding of lung field: Secondary | ICD-10-CM
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
## 2011-07-03 NOTE — Patient Instructions (Signed)
Follow up with Dr. Delton Coombes  In 6-8 weeks  Most important goal is to quit smoking  follow up Dr. Laneta Simmers next week as planned  We are referring you to oncology to discuss with pathology results.  Please contact office for sooner follow up if symptoms do not improve or worsen or seek emergency care

## 2011-07-03 NOTE — Assessment & Plan Note (Addendum)
S/p [name] for lung mass >path positive for adeno, did show tumor invasion through visceral pleura  Case and path reviewed with Dr. Maple Hudson  , w/ recs to refer to oncology for evaluation  Pt to follow up with Dr. Laneta Simmers as planed next week  Plan to refer to oncology for further evaluation.  Advised on smoking cesstation  follow up Dr. Delton Coombes  In 6 weeks and As needed

## 2011-07-04 ENCOUNTER — Telehealth: Payer: Self-pay | Admitting: Internal Medicine

## 2011-07-04 ENCOUNTER — Other Ambulatory Visit: Payer: Self-pay | Admitting: *Deleted

## 2011-07-04 ENCOUNTER — Encounter: Payer: Self-pay | Admitting: *Deleted

## 2011-07-04 DIAGNOSIS — G8918 Other acute postprocedural pain: Secondary | ICD-10-CM

## 2011-07-04 MED ORDER — HYDROCODONE-ACETAMINOPHEN 7.5-500 MG PO TABS: 1.0000 | ORAL_TABLET | Freq: Four times a day (QID) | ORAL | Status: AC | PRN

## 2011-07-04 NOTE — Telephone Encounter (Signed)
Referred by Rubye Oaks, NP Dx- Lung Ca

## 2011-07-04 NOTE — Telephone Encounter (Signed)
pt aware of new pt appt     aom °

## 2011-07-04 NOTE — Progress Notes (Signed)
Left message for [facility] regarding appt for [name] next week

## 2011-07-07 ENCOUNTER — Other Ambulatory Visit: Payer: Self-pay | Admitting: Surgery

## 2011-07-07 DIAGNOSIS — D381 Neoplasm of uncertain behavior of trachea, bronchus and lung: Secondary | ICD-10-CM

## 2011-07-08 ENCOUNTER — Encounter: Payer: Self-pay | Admitting: Surgery

## 2011-07-09 ENCOUNTER — Encounter: Payer: Self-pay | Admitting: *Deleted

## 2011-07-09 ENCOUNTER — Other Ambulatory Visit: Payer: Self-pay | Admitting: *Deleted

## 2011-07-09 ENCOUNTER — Telehealth: Payer: Self-pay | Admitting: Internal Medicine

## 2011-07-09 ENCOUNTER — Other Ambulatory Visit (HOSPITAL_BASED_OUTPATIENT_CLINIC_OR_DEPARTMENT_OTHER): Payer: Self-pay | Admitting: Lab

## 2011-07-09 ENCOUNTER — Ambulatory Visit (HOSPITAL_BASED_OUTPATIENT_CLINIC_OR_DEPARTMENT_OTHER): Payer: Self-pay | Admitting: Internal Medicine

## 2011-07-09 ENCOUNTER — Ambulatory Visit: Payer: Self-pay

## 2011-07-09 ENCOUNTER — Encounter: Payer: Self-pay | Admitting: Internal Medicine

## 2011-07-09 VITALS — BP 102/77 | HR 50 | Temp 96.7°F | Ht 69.5 in | Wt 128.6 lb

## 2011-07-09 DIAGNOSIS — C3490 Malignant neoplasm of unspecified part of unspecified bronchus or lung: Secondary | ICD-10-CM

## 2011-07-09 DIAGNOSIS — C3410 Malignant neoplasm of upper lobe, unspecified bronchus or lung: Secondary | ICD-10-CM

## 2011-07-09 LAB — CBC WITH DIFFERENTIAL/PLATELET
BASO%: 0.9 % (ref 0.0–2.0)
Basophils Absolute: 0.1 10*3/uL (ref 0.0–0.1)
EOS%: 7.8 % — ABNORMAL HIGH (ref 0.0–7.0)
Eosinophils Absolute: 0.5 10*3/uL (ref 0.0–0.5)
HCT: 41.1 % (ref 38.4–49.9)
HGB: 14 g/dL (ref 13.0–17.1)
LYMPH%: 32.8 % (ref 14.0–49.0)
MCH: 32.7 pg (ref 27.2–33.4)
MCHC: 34 g/dL (ref 32.0–36.0)
MCV: 96.2 fL (ref 79.3–98.0)
MONO#: 0.6 10*3/uL (ref 0.1–0.9)
MONO%: 9.2 % (ref 0.0–14.0)
NEUT#: 3.2 10*3/uL (ref 1.5–6.5)
NEUT%: 49.3 % (ref 39.0–75.0)
Platelets: 265 10*3/uL (ref 140–400)
RBC: 4.27 10*6/uL (ref 4.20–5.82)
RDW: 14.2 % (ref 11.0–14.6)
WBC: 6.4 10*3/uL (ref 4.0–10.3)
lymph#: 2.1 10*3/uL (ref 0.9–3.3)
nRBC: 0 % (ref 0–0)

## 2011-07-09 LAB — COMPREHENSIVE METABOLIC PANEL
ALT: 11 U/L (ref 0–53)
AST: 13 U/L (ref 0–37)
Albumin: 3.9 g/dL (ref 3.5–5.2)
Alkaline Phosphatase: 90 U/L (ref 39–117)
BUN: 13 mg/dL (ref 6–23)
CO2: 27 mEq/L (ref 19–32)
Calcium: 9.1 mg/dL (ref 8.4–10.5)
Chloride: 104 mEq/L (ref 96–112)
Creatinine, Ser: 1.18 mg/dL (ref 0.50–1.35)
Glucose, Bld: 53 mg/dL — ABNORMAL LOW (ref 70–99)
Potassium: 4.1 mEq/L (ref 3.5–5.3)
Sodium: 138 mEq/L (ref 135–145)
Total Bilirubin: 0.3 mg/dL (ref 0.3–1.2)
Total Protein: 6.4 g/dL (ref 6.0–8.3)

## 2011-07-09 NOTE — Progress Notes (Signed)
New patient today, patient has no insurance but patient has the orange card, patient has already applied for medicaid and disability, patient let me know today that in about 1 week he will be homeless so I told him about talking with our social workers, They were not in this week but I called their supervisor gave him the information on the patient ([name]). Patient was please with that and asked if they could call him on his cell which is listed in his demographics.
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
## 2011-07-09 NOTE — Progress Notes (Signed)
Spoke with pt at [facility] today.  Educational information given on lung cancer.  Social worker spoke with pt to address needs.

## 2011-07-09 NOTE — Progress Notes (Unsigned)
Today I met with [name] to assess for needs. Per his report, he participates in the [employer], which provides him with temporary housing and support in the community. According to Mr. Gasca, his course with the program will end soon. His plan is to work with the [employer] to find permanent housing.  Meanwhile we will verify status with the [facility] and follow-up.    Gretta Cool, Marine scientist, Clinical Social Work Dept.

## 2011-07-09 NOTE — Telephone Encounter (Signed)
appts made and printed for pt aom °

## 2011-07-10 ENCOUNTER — Encounter: Payer: Self-pay | Admitting: Surgery

## 2011-07-10 ENCOUNTER — Ambulatory Visit (INDEPENDENT_AMBULATORY_CARE_PROVIDER_SITE_OTHER): Payer: Self-pay | Admitting: Surgery

## 2011-07-10 ENCOUNTER — Ambulatory Visit
Admission: RE | Admit: 2011-07-10 | Discharge: 2011-07-10 | Disposition: A | Discharge status: Home / Self Care | Payer: PRIVATE HEALTH INSURANCE | Source: Ambulatory Visit | Attending: Surgery | Admitting: Surgery

## 2011-07-10 VITALS — BP 111/74 | HR 50 | Resp 19 | Ht 69.0 in | Wt 128.0 lb

## 2011-07-10 DIAGNOSIS — R222 Localized swelling, mass and lump, trunk: Secondary | ICD-10-CM

## 2011-07-10 DIAGNOSIS — D381 Neoplasm of uncertain behavior of trachea, bronchus and lung: Secondary | ICD-10-CM

## 2011-07-10 NOTE — Progress Notes (Signed)
                   [address]            [place] [address]          [phone]      HPI:  Patient returns for routine postoperative follow-up having undergone left upper lobectomy on 06/12/2011. The final pathology showed a T2, N0, M0 well-differentiated adenocarcinoma with tumor invading through the visceral pleura. The patient's early postoperative recovery while in the hospital was notable for an uncomplicated postoperative course. Since hospital discharge the patient reports he still had a lot of left chest wall pain. He was sent home on Percocet but ran out and his pain medicine was switched to hydrocodone which he does not feel is working as well. He has seen Dr. Arbutus Ped for oncology consultation and the plan is to continue following him. No adjuvant therapy will be given at this time.   Current Outpatient Prescriptions  Medication Sig Dispense Refill  . aspirin 81 MG chewable tablet Chew 1 tablet (81 mg total) by mouth daily.      [name] buPROPion (WELLBUTRIN SR) 100 MG 12 hr tablet Take 100 mg by mouth 2 (two) times daily.      [name] HYDROcodone-acetaminophen (LORTAB) 7.5-500 MG per tablet Take 1 tablet by mouth every 6 (six) hours as needed (may take one or two tabs 4-6 hrs prn pain.....max of 8 tabs a day). For pain  40 tablet  0  . pravastatin (PRAVACHOL) 40 MG tablet Take 40 mg by mouth daily.      . traZODone (DESYREL) 100 MG tablet Take 100 mg by mouth at bedtime.        Physical Exam: BP 111/74  Pulse 50  Resp 19  Ht 5\' 9"  (1.753 m)  Wt 128 lb (58.06 kg)  BMI 18.90 kg/m2  SpO2 98% He looks uncomfortable. Lung exam is clear. Cardiac exam shows regular rate and rhythm with normal heart sounds. The left chest incision is healing well.  Diagnostic Tests:  *RADIOLOGY REPORT*   Clinical Data: Left lung surgery.  Lung cancer.  Left chest pain, shortness of breath.   CHEST - 2 VIEW   Comparison: 07/03/2011   Findings: Postoperative changes in the left  lung.  Stable scarring in the left lung base with volume loss.  No acute opacities.  Heart is normal size.  No effusions.  Right lung is clear. No acute bony abnormality.   IMPRESSION: Stable postoperative changes on the left with volume loss and scarring.  No acute findings.   Original Report Authenticated By: Cyndie Chime, M.D.   Impression:  Overall I think he is making a good recovery following his surgery. He still has significant chest wall pain. I wrote him a prescription today for Percocet 10/325, 1 by mouth every 6 hours when necessary for pain #40.  Plan: I will see him back in about 2 weeks for reevaluation of his pain control.

## 2011-07-11 ENCOUNTER — Encounter: Payer: Self-pay | Admitting: *Deleted

## 2011-07-11 NOTE — Progress Notes (Unsigned)
[name] is currently a client of the [employer] which is assisting him with housing. His time in the program will end soon and will not be extended. I spoke with congregational nursing who will assist his transition to emergency shelter options.  Gretta Cool, Marine scientist, Clinical Social Work Department

## 2011-07-29 ENCOUNTER — Encounter: Payer: Self-pay | Admitting: Surgery

## 2011-08-25 ENCOUNTER — Ambulatory Visit: Payer: Self-pay | Admitting: Emergency Medicine

## 2011-09-05 ENCOUNTER — Other Ambulatory Visit: Payer: Self-pay

## 2011-09-09 ENCOUNTER — Encounter: Payer: Self-pay | Admitting: Surgery

## 2011-09-10 ENCOUNTER — Encounter: Payer: Self-pay | Admitting: Surgery

## 2011-09-30 ENCOUNTER — Encounter: Payer: Self-pay | Admitting: Surgery

## 2011-10-13 ENCOUNTER — Other Ambulatory Visit: Payer: Self-pay | Admitting: Surgery

## 2011-10-13 DIAGNOSIS — Z902 Acquired absence of lung [part of]: Secondary | ICD-10-CM

## 2011-10-14 ENCOUNTER — Ambulatory Visit: Payer: Medicaid Other | Admitting: Surgery

## 2011-11-04 ENCOUNTER — Encounter: Payer: Medicaid Other | Admitting: Surgery

## 2011-11-25 ENCOUNTER — Ambulatory Visit (INDEPENDENT_AMBULATORY_CARE_PROVIDER_SITE_OTHER): Payer: Medicaid Other | Admitting: Surgery

## 2011-11-25 ENCOUNTER — Ambulatory Visit
Admission: RE | Admit: 2011-11-25 | Discharge: 2011-11-25 | Disposition: A | Discharge status: Home / Self Care | Payer: Medicaid Other | Source: Ambulatory Visit | Attending: Surgery | Admitting: Surgery

## 2011-11-25 ENCOUNTER — Encounter: Payer: Self-pay | Admitting: Surgery

## 2011-11-25 VITALS — BP 112/70 | HR 50 | Temp 98.5°F | Resp 16 | Ht 69.0 in | Wt 132.0 lb

## 2011-11-25 DIAGNOSIS — Z902 Acquired absence of lung [part of]: Secondary | ICD-10-CM

## 2011-11-25 DIAGNOSIS — Z09 Encounter for follow-up examination after completed treatment for conditions other than malignant neoplasm: Secondary | ICD-10-CM

## 2011-11-25 DIAGNOSIS — C3490 Malignant neoplasm of unspecified part of unspecified bronchus or lung: Secondary | ICD-10-CM

## 2011-11-25 DIAGNOSIS — Z9889 Other specified postprocedural states: Secondary | ICD-10-CM

## 2011-11-25 NOTE — Progress Notes (Signed)
[address]            [place] [address]          [phone]      HPI:  The patient returns for followup status post left upper lobectomy on 06/12/2011 for a stage IB (T2A, N0, M0) non-small cell lung cancer consistent with adenocarcinoma. This was a 2.0 cm tumor with visceral pleural invasion. The patient was seen in oncologic consultation by Dr. Arbutus Ped and he did not think there was any indication for adjuvant therapy at this time. The patient was scheduled for a followup CT scan to be done in early December 2013. He returns today reporting about one week history of left-sided chest discomfort just anterior to his chest incision. He said he had been doing fine until the past week and is not sure if it is due to sleeping in a different bed at a shelter. He has had no shortness of breath. He said the pain feels like it is in the chest wall. It is not made worse by breathing. He denies any cough or sputum production. He has had no hemoptysis. He has continued to smoke.  Current Outpatient Prescriptions  Medication Sig Dispense Refill  . aspirin 81 MG chewable tablet Chew 1 tablet (81 mg total) by mouth daily.      [name] buPROPion (WELLBUTRIN SR) 100 MG 12 hr tablet Take 100 mg by mouth 2 (two) times daily.      [name] oxyCODONE-acetaminophen (PERCOCET) 10-325 MG per tablet Take 1 tablet by mouth every 6 (six) hours as needed.       . pravastatin (PRAVACHOL) 40 MG tablet Take 40 mg by mouth daily.      . traZODone (DESYREL) 100 MG tablet Take 100 mg by mouth at bedtime.         Physical Exam: BP 112/70  Pulse 50  Temp 98.5 F (36.9 C) (Oral)  Resp 16  Ht 5\' 9"  (1.753 m)  Wt 132 lb (59.875 kg)  BMI 19.49 kg/m2  SpO2 95% He looks well. There is no cervical or supraclavicular adenopathy Lung exam is clear. The left chest incision is well-healed. There are no chest wall abnormalities. Cardiac exam shows a regular rate and rhythm with normal heart  sounds.  Diagnostic Tests:  Chest x-ray today shows postoperative changes in the left base. The lungs are otherwise clear. There is no pleural effusion. There is no sign of recurrent cancer.  Impression:  He is now about 5 months following left upper lobectomy for lung cancer. There is no sign of recurrent cancer. I'm not sure why he has developed left chest wall pain over the past week. The pain is anterior to the left thoracotomy incision in the distribution where we would typically see postsurgical neuralgia. He said that he has taken a couple Percocet this week that he had left over from his surgery and that seemed to help. I will write him prescription today for Percocet 5 mg/325 mg every 6 hours when necessary for pain #50. I also told him to use Ibuprofen as needed. I will see him back in about one month to see how his pain is doing. He does have a followup CT scan of the chest in December and I'll see him back after that.  Plan:  He'll return to see me in one month.

## 2012-01-06 ENCOUNTER — Other Ambulatory Visit: Payer: Self-pay | Admitting: Lab

## 2012-01-06 ENCOUNTER — Ambulatory Visit: Payer: Medicaid Other | Admitting: Surgery

## 2012-01-06 ENCOUNTER — Ambulatory Visit (HOSPITAL_COMMUNITY): Admission: RE | Admit: 2012-01-06 | Payer: Medicaid Other | Source: Ambulatory Visit

## 2012-01-06 NOTE — Progress Notes (Unsigned)
This encounter was created in error - please disregard.

## 2012-01-08 ENCOUNTER — Other Ambulatory Visit: Payer: Self-pay | Admitting: *Deleted

## 2012-01-08 ENCOUNTER — Ambulatory Visit: Payer: Self-pay | Admitting: Internal Medicine

## 2012-01-09 ENCOUNTER — Telehealth: Payer: Self-pay | Admitting: Internal Medicine

## 2012-01-09 NOTE — Telephone Encounter (Signed)
lm to call back if he would like me to r/s his missed appts      anne

## 2012-02-10 ENCOUNTER — Encounter: Payer: Medicaid Other | Admitting: Surgery

## 2012-02-11 ENCOUNTER — Encounter: Payer: Medicaid Other | Admitting: Surgery

## 2012-02-20 ENCOUNTER — Emergency Department (HOSPITAL_BASED_OUTPATIENT_CLINIC_OR_DEPARTMENT_OTHER): Payer: Medicaid Other

## 2012-02-20 ENCOUNTER — Emergency Department (HOSPITAL_BASED_OUTPATIENT_CLINIC_OR_DEPARTMENT_OTHER)
Admission: EM | Admit: 2012-02-20 | Discharge: 2012-02-20 | Disposition: A | Discharge status: Home / Self Care | Payer: Medicaid Other | Attending: Emergency Medicine | Admitting: Emergency Medicine

## 2012-02-20 ENCOUNTER — Encounter (HOSPITAL_BASED_OUTPATIENT_CLINIC_OR_DEPARTMENT_OTHER): Payer: Self-pay | Admitting: *Deleted

## 2012-02-20 DIAGNOSIS — Z7982 Long term (current) use of aspirin: Secondary | ICD-10-CM | POA: Insufficient documentation

## 2012-02-20 DIAGNOSIS — R5383 Other fatigue: Secondary | ICD-10-CM | POA: Insufficient documentation

## 2012-02-20 DIAGNOSIS — K219 Gastro-esophageal reflux disease without esophagitis: Secondary | ICD-10-CM | POA: Insufficient documentation

## 2012-02-20 DIAGNOSIS — R6889 Other general symptoms and signs: Secondary | ICD-10-CM

## 2012-02-20 DIAGNOSIS — G4700 Insomnia, unspecified: Secondary | ICD-10-CM | POA: Insufficient documentation

## 2012-02-20 DIAGNOSIS — J439 Emphysema, unspecified: Secondary | ICD-10-CM | POA: Insufficient documentation

## 2012-02-20 DIAGNOSIS — F3289 Other specified depressive episodes: Secondary | ICD-10-CM | POA: Insufficient documentation

## 2012-02-20 DIAGNOSIS — IMO0001 Reserved for inherently not codable concepts without codable children: Secondary | ICD-10-CM | POA: Insufficient documentation

## 2012-02-20 DIAGNOSIS — J029 Acute pharyngitis, unspecified: Secondary | ICD-10-CM | POA: Insufficient documentation

## 2012-02-20 DIAGNOSIS — R5381 Other malaise: Secondary | ICD-10-CM | POA: Insufficient documentation

## 2012-02-20 DIAGNOSIS — Z8701 Personal history of pneumonia (recurrent): Secondary | ICD-10-CM | POA: Insufficient documentation

## 2012-02-20 DIAGNOSIS — R05 Cough: Secondary | ICD-10-CM | POA: Insufficient documentation

## 2012-02-20 DIAGNOSIS — R509 Fever, unspecified: Secondary | ICD-10-CM | POA: Insufficient documentation

## 2012-02-20 DIAGNOSIS — Z85118 Personal history of other malignant neoplasm of bronchus and lung: Secondary | ICD-10-CM | POA: Insufficient documentation

## 2012-02-20 DIAGNOSIS — Z8719 Personal history of other diseases of the digestive system: Secondary | ICD-10-CM | POA: Insufficient documentation

## 2012-02-20 DIAGNOSIS — J3489 Other specified disorders of nose and nasal sinuses: Secondary | ICD-10-CM | POA: Insufficient documentation

## 2012-02-20 DIAGNOSIS — E785 Hyperlipidemia, unspecified: Secondary | ICD-10-CM | POA: Insufficient documentation

## 2012-02-20 DIAGNOSIS — F17200 Nicotine dependence, unspecified, uncomplicated: Secondary | ICD-10-CM | POA: Insufficient documentation

## 2012-02-20 DIAGNOSIS — Z87448 Personal history of other diseases of urinary system: Secondary | ICD-10-CM | POA: Insufficient documentation

## 2012-02-20 DIAGNOSIS — Z8739 Personal history of other diseases of the musculoskeletal system and connective tissue: Secondary | ICD-10-CM | POA: Insufficient documentation

## 2012-02-20 DIAGNOSIS — R059 Cough, unspecified: Secondary | ICD-10-CM | POA: Insufficient documentation

## 2012-02-20 DIAGNOSIS — Z8669 Personal history of other diseases of the nervous system and sense organs: Secondary | ICD-10-CM | POA: Insufficient documentation

## 2012-02-20 DIAGNOSIS — M545 Low back pain, unspecified: Secondary | ICD-10-CM | POA: Insufficient documentation

## 2012-02-20 DIAGNOSIS — G8929 Other chronic pain: Secondary | ICD-10-CM | POA: Insufficient documentation

## 2012-02-20 DIAGNOSIS — Z79899 Other long term (current) drug therapy: Secondary | ICD-10-CM | POA: Insufficient documentation

## 2012-02-20 DIAGNOSIS — F1410 Cocaine abuse, uncomplicated: Secondary | ICD-10-CM | POA: Insufficient documentation

## 2012-02-20 DIAGNOSIS — R9439 Abnormal result of other cardiovascular function study: Secondary | ICD-10-CM | POA: Insufficient documentation

## 2012-02-20 DIAGNOSIS — Z8679 Personal history of other diseases of the circulatory system: Secondary | ICD-10-CM | POA: Insufficient documentation

## 2012-02-20 DIAGNOSIS — F329 Major depressive disorder, single episode, unspecified: Secondary | ICD-10-CM | POA: Insufficient documentation

## 2012-02-20 DIAGNOSIS — Z2089 Contact with and (suspected) exposure to other communicable diseases: Secondary | ICD-10-CM | POA: Insufficient documentation

## 2012-02-20 DIAGNOSIS — R51 Headache: Secondary | ICD-10-CM | POA: Insufficient documentation

## 2012-02-20 DIAGNOSIS — I2510 Atherosclerotic heart disease of native coronary artery without angina pectoris: Secondary | ICD-10-CM | POA: Insufficient documentation

## 2012-02-20 HISTORY — DX: Cocaine use, unspecified, uncomplicated: F14.90

## 2012-02-20 MED ORDER — OSELTAMIVIR PHOSPHATE 75 MG PO CAPS: 75.0000 mg | ORAL_CAPSULE | Freq: Two times a day (BID) | ORAL | Status: DC

## 2012-02-20 MED ORDER — IBUPROFEN 600 MG PO TABS: 600.0000 mg | ORAL_TABLET | Freq: Four times a day (QID) | ORAL | Status: DC | PRN

## 2012-02-20 MED ORDER — BENZONATATE 100 MG PO CAPS: 100.0000 mg | ORAL_CAPSULE | Freq: Three times a day (TID) | ORAL | Status: DC

## 2012-02-20 NOTE — ED Notes (Signed)
Flu like symptoms for past two days, dry cough, diarrhea earlier this week, none today, fever 102 degrees this morning, took ibuprofen, generalized body aches
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
## 2012-03-02 ENCOUNTER — Ambulatory Visit (INDEPENDENT_AMBULATORY_CARE_PROVIDER_SITE_OTHER): Payer: Medicaid Other | Admitting: Surgery

## 2012-03-02 ENCOUNTER — Encounter: Payer: Self-pay | Admitting: Surgery

## 2012-03-02 VITALS — BP 132/80 | HR 50 | Resp 20 | Ht 69.0 in | Wt 135.0 lb

## 2012-03-02 DIAGNOSIS — Z902 Acquired absence of lung [part of]: Secondary | ICD-10-CM

## 2012-03-02 DIAGNOSIS — C3490 Malignant neoplasm of unspecified part of unspecified bronchus or lung: Secondary | ICD-10-CM

## 2012-03-02 DIAGNOSIS — Z09 Encounter for follow-up examination after completed treatment for conditions other than malignant neoplasm: Secondary | ICD-10-CM

## 2012-03-02 DIAGNOSIS — Z9889 Other specified postprocedural states: Secondary | ICD-10-CM

## 2012-03-02 NOTE — Progress Notes (Signed)
                   [address]            [place] [address]          [phone]      HPI:  The patient returns for followup status post left upper lobectomy on 06/12/2011 for a stage IB (T2A, N0, M0) non-small cell lung cancer consistent with adenocarcinoma. This was a 2.0 cm tumor with visceral pleural invasion. The patient was seen in oncologic consultation by Dr. Arbutus Ped and he did not think there was any indication for adjuvant therapy at that time. The patient continues to smoke. He says he is having some "personal relationship issues" but no specific complaints.     Current Outpatient Prescriptions  Medication Sig Dispense Refill  . aspirin 81 MG chewable tablet Chew 1 tablet (81 mg total) by mouth daily.      [name] buPROPion (WELLBUTRIN SR) 100 MG 12 hr tablet Take 100 mg by mouth 2 (two) times daily.      [name] ibuprofen (ADVIL,MOTRIN) 600 MG tablet Take 1 tablet (600 mg total) by mouth every 6 (six) hours as needed for pain.  30 tablet  0  . traZODone (DESYREL) 100 MG tablet Take 100 mg by mouth at bedtime.         Physical Exam: BP 132/80  Pulse 50  Resp 20  Ht 5\' 9"  (1.753 m)  Wt 135 lb (61.236 kg)  BMI 19.94 kg/m2  SpO2 95% He looks well There is no cervical or supraclavicular adenopathy. Lung exam is clear. The left thoracotomy scar is unremarkable. Abdominal exam shows active bowel sounds. Abdomen is soft, flat and nontender. There are no palpable masses or organomegaly.  Diagnostic Tests:  *RADIOLOGY REPORT*   Clinical Data: Cough, fever   CHEST - 2 VIEW   Comparison: 11/25/2011   Findings: Cardiomediastinal silhouette is stable.  Surgical clips in the left hilum again noted.  Hyperinflation.  Stable scarring left infrahilar region and left base.   Mild degenerative changes thoracic spine.  No acute infiltrate or pulmonary edema.   IMPRESSION: No active disease.  Hyperinflation.  Stable left postoperative changes.     Original Report  Authenticated By: Natasha Mead, M.D.     Impression:  He continues to do well following left upper lobectomy for stage IB lung cancer with no evidence of recurrence. I advised him to completely abstain from smoking.  Plan:  I'll plan to see him back in May 2014 for CT scan of the chest.

## 2012-06-11 ENCOUNTER — Other Ambulatory Visit: Payer: Self-pay | Admitting: *Deleted

## 2012-06-30 ENCOUNTER — Ambulatory Visit: Payer: Medicaid Other | Admitting: Surgery

## 2012-06-30 ENCOUNTER — Other Ambulatory Visit: Payer: Medicaid Other

## 2012-07-09 ENCOUNTER — Other Ambulatory Visit: Payer: Self-pay

## 2012-07-09 ENCOUNTER — Emergency Department (HOSPITAL_COMMUNITY)
Admission: EM | Admit: 2012-07-09 | Discharge: 2012-07-09 | Disposition: A | Discharge status: Home / Self Care | Payer: Medicaid Other | Attending: Emergency Medicine | Admitting: Emergency Medicine

## 2012-07-09 ENCOUNTER — Encounter (HOSPITAL_COMMUNITY): Payer: Self-pay | Admitting: Neurology

## 2012-07-09 DIAGNOSIS — F329 Major depressive disorder, single episode, unspecified: Secondary | ICD-10-CM | POA: Insufficient documentation

## 2012-07-09 DIAGNOSIS — Z862 Personal history of diseases of the blood and blood-forming organs and certain disorders involving the immune mechanism: Secondary | ICD-10-CM | POA: Insufficient documentation

## 2012-07-09 DIAGNOSIS — Z8709 Personal history of other diseases of the respiratory system: Secondary | ICD-10-CM | POA: Insufficient documentation

## 2012-07-09 DIAGNOSIS — M25519 Pain in unspecified shoulder: Secondary | ICD-10-CM | POA: Insufficient documentation

## 2012-07-09 DIAGNOSIS — M79609 Pain in unspecified limb: Secondary | ICD-10-CM | POA: Insufficient documentation

## 2012-07-09 DIAGNOSIS — M549 Dorsalgia, unspecified: Secondary | ICD-10-CM | POA: Insufficient documentation

## 2012-07-09 DIAGNOSIS — F1410 Cocaine abuse, uncomplicated: Secondary | ICD-10-CM | POA: Insufficient documentation

## 2012-07-09 DIAGNOSIS — Z8679 Personal history of other diseases of the circulatory system: Secondary | ICD-10-CM | POA: Insufficient documentation

## 2012-07-09 DIAGNOSIS — Z8739 Personal history of other diseases of the musculoskeletal system and connective tissue: Secondary | ICD-10-CM | POA: Insufficient documentation

## 2012-07-09 DIAGNOSIS — Z87448 Personal history of other diseases of urinary system: Secondary | ICD-10-CM | POA: Insufficient documentation

## 2012-07-09 DIAGNOSIS — R071 Chest pain on breathing: Secondary | ICD-10-CM | POA: Insufficient documentation

## 2012-07-09 DIAGNOSIS — Z8719 Personal history of other diseases of the digestive system: Secondary | ICD-10-CM | POA: Insufficient documentation

## 2012-07-09 DIAGNOSIS — M542 Cervicalgia: Secondary | ICD-10-CM

## 2012-07-09 DIAGNOSIS — G8929 Other chronic pain: Secondary | ICD-10-CM | POA: Insufficient documentation

## 2012-07-09 DIAGNOSIS — Z85118 Personal history of other malignant neoplasm of bronchus and lung: Secondary | ICD-10-CM | POA: Insufficient documentation

## 2012-07-09 DIAGNOSIS — M2550 Pain in unspecified joint: Secondary | ICD-10-CM | POA: Insufficient documentation

## 2012-07-09 DIAGNOSIS — I2510 Atherosclerotic heart disease of native coronary artery without angina pectoris: Secondary | ICD-10-CM | POA: Insufficient documentation

## 2012-07-09 DIAGNOSIS — F17200 Nicotine dependence, unspecified, uncomplicated: Secondary | ICD-10-CM | POA: Insufficient documentation

## 2012-07-09 DIAGNOSIS — M79601 Pain in right arm: Secondary | ICD-10-CM

## 2012-07-09 DIAGNOSIS — R51 Headache: Secondary | ICD-10-CM | POA: Insufficient documentation

## 2012-07-09 DIAGNOSIS — F3289 Other specified depressive episodes: Secondary | ICD-10-CM | POA: Insufficient documentation

## 2012-07-09 DIAGNOSIS — Z791 Long term (current) use of non-steroidal anti-inflammatories (NSAID): Secondary | ICD-10-CM | POA: Insufficient documentation

## 2012-07-09 DIAGNOSIS — R0789 Other chest pain: Secondary | ICD-10-CM

## 2012-07-09 DIAGNOSIS — Z79899 Other long term (current) drug therapy: Secondary | ICD-10-CM | POA: Insufficient documentation

## 2012-07-09 DIAGNOSIS — IMO0001 Reserved for inherently not codable concepts without codable children: Secondary | ICD-10-CM | POA: Insufficient documentation

## 2012-07-09 DIAGNOSIS — M25511 Pain in right shoulder: Secondary | ICD-10-CM

## 2012-07-09 DIAGNOSIS — Z8639 Personal history of other endocrine, nutritional and metabolic disease: Secondary | ICD-10-CM | POA: Insufficient documentation

## 2012-07-09 DIAGNOSIS — G4700 Insomnia, unspecified: Secondary | ICD-10-CM | POA: Insufficient documentation

## 2012-07-09 DIAGNOSIS — Z8701 Personal history of pneumonia (recurrent): Secondary | ICD-10-CM | POA: Insufficient documentation

## 2012-07-09 LAB — TROPONIN I
Troponin I: <0.3 ng/mL (ref <0.30)
Troponin I: <0.3 ng/mL (ref <0.30)

## 2012-07-09 MED ORDER — IBUPROFEN 800 MG PO TABS
800.0000 mg | ORAL_TABLET | Freq: Once | ORAL | Status: AC
  Administered 2012-07-09: 800 mg via ORAL
  Filled 2012-07-09: qty 1

## 2012-07-09 MED ORDER — PREDNISONE 20 MG PO TABS: 40.0000 mg | ORAL_TABLET | Freq: Every day | ORAL | Status: DC

## 2012-07-09 MED ORDER — IBUPROFEN 800 MG PO TABS: 800.0000 mg | ORAL_TABLET | Freq: Three times a day (TID) | ORAL | Status: DC

## 2012-07-09 MED ORDER — HYDROCODONE-ACETAMINOPHEN 5-325 MG PO TABS: ORAL_TABLET | ORAL | Status: DC

## 2012-07-09 MED ORDER — HYDROCODONE-ACETAMINOPHEN 5-325 MG PO TABS
2.0000 | ORAL_TABLET | Freq: Once | ORAL | Status: AC
  Administered 2012-07-09: 2 via ORAL
  Filled 2012-07-09: qty 2

## 2012-07-09 NOTE — ED Notes (Signed)
Pt upset missing fish lunch special at [facility] shelter. Pt ordered fried catfish meal here along with sides and dessert. Will be discharged home when finished eating.
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
## 2012-07-09 NOTE — ED Notes (Signed)
Pt requests a doctors note for bed rest so he can stay at the homeless shelter during the day.

## 2012-07-09 NOTE — ED Notes (Signed)
Food tray ordered
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
## 2012-07-10 NOTE — ED Provider Notes (Signed)
Medical screening examination/treatment/procedure(s) were performed by non-physician practitioner and as supervising physician I was immediately available for consultation/collaboration.    Vida Roller, MD 07/10/12 [phone]

## 2012-07-12 ENCOUNTER — Other Ambulatory Visit: Payer: Self-pay | Admitting: *Deleted

## 2012-07-14 ENCOUNTER — Other Ambulatory Visit: Payer: Medicaid Other

## 2012-07-14 ENCOUNTER — Ambulatory Visit: Payer: Medicaid Other | Admitting: Surgery

## 2012-07-20 ENCOUNTER — Emergency Department (INDEPENDENT_AMBULATORY_CARE_PROVIDER_SITE_OTHER)
Admission: EM | Admit: 2012-07-20 | Discharge: 2012-07-20 | Disposition: A | Discharge status: Home / Self Care | Payer: Medicaid Other | Source: Home / Self Care

## 2012-07-20 ENCOUNTER — Encounter (HOSPITAL_COMMUNITY): Payer: Self-pay | Admitting: *Deleted

## 2012-07-20 DIAGNOSIS — M5412 Radiculopathy, cervical region: Secondary | ICD-10-CM

## 2012-07-20 MED ORDER — BUPROPION HCL ER (SR) 100 MG PO TB12: 100.0000 mg | ORAL_TABLET | Freq: Two times a day (BID) | ORAL | Status: DC

## 2012-07-20 MED ORDER — METHYLPREDNISOLONE ACETATE 80 MG/ML IJ SUSP
80.0000 mg | Freq: Once | INTRAMUSCULAR | Status: AC
  Administered 2012-07-20: 80 mg via INTRAMUSCULAR

## 2012-07-20 MED ORDER — METHYLPREDNISOLONE ACETATE 80 MG/ML IJ SUSP
INTRAMUSCULAR | Status: AC
  Filled 2012-07-20: qty 1

## 2012-07-20 MED ORDER — MELOXICAM 15 MG PO TABS: 15.0000 mg | ORAL_TABLET | Freq: Every day | ORAL | Status: DC

## 2012-07-20 MED ORDER — METHOCARBAMOL 500 MG PO TABS: 500.0000 mg | ORAL_TABLET | Freq: Three times a day (TID) | ORAL | Status: DC

## 2012-07-20 MED ORDER — PREDNISONE 10 MG PO TABS: ORAL_TABLET | ORAL | Status: DC

## 2012-07-20 MED ORDER — TRAZODONE HCL 100 MG PO TABS: 100.0000 mg | ORAL_TABLET | Freq: Every day | ORAL | Status: DC

## 2012-07-20 MED ORDER — KETOROLAC TROMETHAMINE 60 MG/2ML IM SOLN
60.0000 mg | Freq: Once | INTRAMUSCULAR | Status: AC
  Administered 2012-07-20: 60 mg via INTRAMUSCULAR

## 2012-07-20 MED ORDER — KETOROLAC TROMETHAMINE 60 MG/2ML IM SOLN
INTRAMUSCULAR | Status: AC
  Filled 2012-07-20: qty 2
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
## 2012-07-20 NOTE — ED Notes (Signed)
Pt  Reports  Pain  r  Side  For  sev  Weeks   Pt  States  He  aggrevated  An old  Injury   He  Ambulated  To  Room  With a  Steady  Fluid  Gait   He is  Sitting  Upright on  Exam table  speakingg in  Complete  sentances   And  Is    In no  Acute distress

## 2012-07-28 ENCOUNTER — Ambulatory Visit: Payer: Medicaid Other | Admitting: Surgery

## 2012-08-04 ENCOUNTER — Ambulatory Visit: Payer: Medicaid Other | Admitting: Surgery

## 2012-11-10 ENCOUNTER — Encounter: Payer: Self-pay | Admitting: Surgery

## 2012-11-10 ENCOUNTER — Ambulatory Visit (INDEPENDENT_AMBULATORY_CARE_PROVIDER_SITE_OTHER): Payer: Medicaid Other | Admitting: Surgery

## 2012-11-10 ENCOUNTER — Ambulatory Visit
Admission: RE | Admit: 2012-11-10 | Discharge: 2012-11-10 | Disposition: A | Discharge status: Home / Self Care | Payer: Medicaid Other | Source: Ambulatory Visit | Attending: Surgery | Admitting: Surgery

## 2012-11-10 DIAGNOSIS — Z902 Acquired absence of lung [part of]: Secondary | ICD-10-CM

## 2012-11-10 DIAGNOSIS — Z9889 Other specified postprocedural states: Secondary | ICD-10-CM

## 2012-11-10 DIAGNOSIS — C3410 Malignant neoplasm of upper lobe, unspecified bronchus or lung: Secondary | ICD-10-CM

## 2012-11-10 NOTE — Progress Notes (Signed)
[address]       [name] [address]             [phone]       HPI:  The patient returns for followup status post left upper lobectomy on 06/12/2011 for a stage IB (T2A, N0, M0) non-small cell lung cancer consistent with adenocarcinoma. This was a 2.0 cm tumor with visceral pleural invasion. The patient was seen in oncologic consultation by Dr. Arbutus Ped and he did not think there was any indication for adjuvant therapy at that time.  He has had no shortness of breath. He has mild left chest wall tightness. It is not made worse by breathing. He denies any cough or sputum production. He has had no hemoptysis. He has continued to smoke.      Current Outpatient Prescriptions  Medication Sig Dispense Refill  . buPROPion (WELLBUTRIN SR) 100 MG 12 hr tablet Take 100 mg by mouth 2 (two) times daily.      [name] buPROPion (WELLBUTRIN SR) 100 MG 12 hr tablet Take 1 tablet (100 mg total) by mouth 2 (two) times daily.  60 tablet  0  . HYDROcodone-acetaminophen (NORCO/VICODIN) 5-325 MG per tablet Take 1-2 tabs every 6 hours as needed for pain  10 tablet  0  . ibuprofen (ADVIL,MOTRIN) 800 MG tablet Take 1 tablet (800 mg total) by mouth 3 (three) times daily.  21 tablet  0  . meloxicam (MOBIC) 15 MG tablet Take 1 tablet (15 mg total) by mouth daily.  15 tablet  0  . methocarbamol (ROBAXIN) 500 MG tablet Take 1 tablet (500 mg total) by mouth 3 (three) times daily.  30 tablet  0  . naproxen (NAPROSYN) 500 MG tablet Take 500 mg by mouth 2 (two) times daily with a meal.      . predniSONE (DELTASONE) 10 MG tablet Take 4 tabs daily for 4 days, 3 tabs daily for 4 days, 2 tabs daily for 4 days, then 1 tab daily for 4 days.  40 tablet  0  . predniSONE (DELTASONE) 20 MG tablet Take 2 tablets (40 mg total) by mouth daily. Take 40 mg by mouth daily for 3 days, then 20mg  by mouth daily for 3 days, then 10mg  daily for 3 days  12 tablet  0  . traZODone (DESYREL) 100 MG tablet Take 100 mg by mouth  at bedtime.      . traZODone (DESYREL) 100 MG tablet Take 1 tablet (100 mg total) by mouth at bedtime.  30 tablet  0   No current facility-administered medications for this visit.     Physical Exam: BP 102/66  Pulse 67  Resp 16  Ht 5\' 9"  (1.753 m)  Wt 140 lb (63.504 kg)  BMI 20.67 kg/m2  SpO2 96% He looks well  There is no cervical or supraclavicular adenopathy.  Lung exam is clear.  The left thoracotomy scar is unremarkable.  Abdominal exam shows active bowel sounds. Abdomen is soft, flat and nontender. There are no palpable masses or organomegaly.   Diagnostic Tests:  CLINICAL DATA:  Cough. History of lung cancer.   EXAM: CHEST  2 VIEW   COMPARISON:  02/20/2012   FINDINGS: Postoperative appearance of the left hemi thorax is again noted. Scarring in the left base and volume loss is stable. The heart size is normal. No pleural effusion or edema identified. No airspace consolidation identified. No suspicious pulmonary mass or nodule identified.   IMPRESSION: 1.  No acute cardiopulmonary abnormalities.   2.  Stable postoperative appearance of the left lung     Electronically Signed   By: Signa Kell M.D.   Impression:  He continues to do well following left upper lobectomy for stage IB adenocarcinoma. I stressed the importance of not smoking.   Plan:  I will see him back in 6 months with a CT scan of the chest.

## 2012-12-27 ENCOUNTER — Emergency Department (HOSPITAL_COMMUNITY)
Admission: EM | Admit: 2012-12-27 | Discharge: 2012-12-28 | Disposition: A | Discharge status: Home / Self Care | Payer: Medicaid Other | Attending: Emergency Medicine | Admitting: Emergency Medicine

## 2012-12-27 ENCOUNTER — Encounter (HOSPITAL_COMMUNITY): Payer: Self-pay | Admitting: Emergency Medicine

## 2012-12-27 DIAGNOSIS — Z9861 Coronary angioplasty status: Secondary | ICD-10-CM | POA: Insufficient documentation

## 2012-12-27 DIAGNOSIS — J438 Other emphysema: Secondary | ICD-10-CM | POA: Insufficient documentation

## 2012-12-27 DIAGNOSIS — E785 Hyperlipidemia, unspecified: Secondary | ICD-10-CM | POA: Insufficient documentation

## 2012-12-27 DIAGNOSIS — F17200 Nicotine dependence, unspecified, uncomplicated: Secondary | ICD-10-CM | POA: Insufficient documentation

## 2012-12-27 DIAGNOSIS — M129 Arthropathy, unspecified: Secondary | ICD-10-CM | POA: Insufficient documentation

## 2012-12-27 DIAGNOSIS — Z79899 Other long term (current) drug therapy: Secondary | ICD-10-CM | POA: Insufficient documentation

## 2012-12-27 DIAGNOSIS — Z8701 Personal history of pneumonia (recurrent): Secondary | ICD-10-CM | POA: Insufficient documentation

## 2012-12-27 DIAGNOSIS — Z8719 Personal history of other diseases of the digestive system: Secondary | ICD-10-CM | POA: Insufficient documentation

## 2012-12-27 DIAGNOSIS — I2510 Atherosclerotic heart disease of native coronary artery without angina pectoris: Secondary | ICD-10-CM | POA: Insufficient documentation

## 2012-12-27 DIAGNOSIS — F3289 Other specified depressive episodes: Secondary | ICD-10-CM | POA: Insufficient documentation

## 2012-12-27 DIAGNOSIS — K219 Gastro-esophageal reflux disease without esophagitis: Secondary | ICD-10-CM | POA: Insufficient documentation

## 2012-12-27 DIAGNOSIS — J189 Pneumonia, unspecified organism: Secondary | ICD-10-CM

## 2012-12-27 DIAGNOSIS — G8929 Other chronic pain: Secondary | ICD-10-CM | POA: Insufficient documentation

## 2012-12-27 DIAGNOSIS — G4700 Insomnia, unspecified: Secondary | ICD-10-CM | POA: Insufficient documentation

## 2012-12-27 DIAGNOSIS — R109 Unspecified abdominal pain: Secondary | ICD-10-CM | POA: Insufficient documentation

## 2012-12-27 DIAGNOSIS — Z85038 Personal history of other malignant neoplasm of large intestine: Secondary | ICD-10-CM | POA: Insufficient documentation

## 2012-12-27 DIAGNOSIS — F329 Major depressive disorder, single episode, unspecified: Secondary | ICD-10-CM | POA: Insufficient documentation

## 2012-12-27 DIAGNOSIS — R1011 Right upper quadrant pain: Secondary | ICD-10-CM | POA: Insufficient documentation

## 2012-12-27 DIAGNOSIS — J159 Unspecified bacterial pneumonia: Secondary | ICD-10-CM | POA: Insufficient documentation

## 2012-12-27 DIAGNOSIS — F1410 Cocaine abuse, uncomplicated: Secondary | ICD-10-CM | POA: Insufficient documentation

## 2012-12-27 LAB — URINALYSIS, ROUTINE W REFLEX MICROSCOPIC
Bilirubin Urine: NEGATIVE
Glucose, UA: NEGATIVE mg/dL
Hgb urine dipstick: NEGATIVE
Ketones, ur: NEGATIVE mg/dL
Leukocytes, UA: NEGATIVE
Nitrite: NEGATIVE
Protein, ur: NEGATIVE mg/dL
Specific Gravity, Urine: 1.018 (ref 1.005–1.030)
Urobilinogen, UA: 1 mg/dL (ref 0.0–1.0)
pH: 6 (ref 5.0–8.0)

## 2012-12-27 MED ORDER — MORPHINE SULFATE 4 MG/ML IJ SOLN
4.0000 mg | Freq: Once | INTRAMUSCULAR | Status: AC
  Administered 2012-12-28: 4 mg via INTRAVENOUS
  Filled 2012-12-27: qty 1

## 2012-12-27 MED ORDER — SODIUM CHLORIDE 0.9 % IV BOLUS (SEPSIS)
1000.0000 mL | Freq: Once | INTRAVENOUS | Status: AC
  Administered 2012-12-28: 1000 mL via INTRAVENOUS

## 2012-12-27 MED ORDER — ONDANSETRON HCL 4 MG/2ML IJ SOLN
4.0000 mg | Freq: Once | INTRAMUSCULAR | Status: AC
  Administered 2012-12-28: 4 mg via INTRAVENOUS
  Filled 2012-12-27: qty 2
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
## 2012-12-27 NOTE — ED Notes (Addendum)
Pt. reports dysuria and bilateral flank pain for 4 days , denies injury , no fever or chills. No hematuria .

## 2012-12-28 ENCOUNTER — Emergency Department (HOSPITAL_COMMUNITY): Payer: Medicaid Other

## 2012-12-28 LAB — COMPREHENSIVE METABOLIC PANEL
ALT: 17 U/L (ref 0–53)
AST: 23 U/L (ref 0–37)
Albumin: 3.6 g/dL (ref 3.5–5.2)
Alkaline Phosphatase: 115 U/L (ref 39–117)
BUN: 12 mg/dL (ref 6–23)
CO2: 21 mEq/L (ref 19–32)
Calcium: 8.9 mg/dL (ref 8.4–10.5)
Chloride: 99 mEq/L (ref 96–112)
Creatinine, Ser: 0.98 mg/dL (ref 0.50–1.35)
GFR calc Af Amer: >90 mL/min (ref >90)
GFR calc non Af Amer: 90 mL/min — ABNORMAL LOW (ref >90)
Glucose, Bld: 195 mg/dL — ABNORMAL HIGH (ref 70–99)
Potassium: 3.8 mEq/L (ref 3.5–5.1)
Sodium: 133 mEq/L — ABNORMAL LOW (ref 135–145)
Total Bilirubin: 0.8 mg/dL (ref 0.3–1.2)
Total Protein: 6.9 g/dL (ref 6.0–8.3)

## 2012-12-28 LAB — CBC WITH DIFFERENTIAL/PLATELET
Basophils Absolute: 0 10*3/uL (ref 0.0–0.1)
Basophils Relative: 0 % (ref 0–1)
Eosinophils Absolute: 0 10*3/uL (ref 0.0–0.7)
Eosinophils Relative: 0 % (ref 0–5)
HCT: 42.8 % (ref 39.0–52.0)
Hemoglobin: 14.6 g/dL (ref 13.0–17.0)
Lymphocytes Relative: 7 % — ABNORMAL LOW (ref 12–46)
Lymphs Abs: 0.7 10*3/uL (ref 0.7–4.0)
MCH: 32 pg (ref 26.0–34.0)
MCHC: 34.1 g/dL (ref 30.0–36.0)
MCV: 93.9 fL (ref 78.0–100.0)
Monocytes Absolute: 0.7 10*3/uL (ref 0.1–1.0)
Monocytes Relative: 7 % (ref 3–12)
Neutro Abs: 9 10*3/uL — ABNORMAL HIGH (ref 1.7–7.7)
Neutrophils Relative %: 86 % — ABNORMAL HIGH (ref 43–77)
Platelets: 130 10*3/uL — ABNORMAL LOW (ref 150–400)
RBC: 4.56 MIL/uL (ref 4.22–5.81)
RDW: 14.2 % (ref 11.5–15.5)
WBC: 10.5 10*3/uL (ref 4.0–10.5)

## 2012-12-28 LAB — LIPASE, BLOOD: Lipase: 23 U/L (ref 11–59)

## 2012-12-28 MED ORDER — AZITHROMYCIN 250 MG PO TABS: 250.0000 mg | ORAL_TABLET | Freq: Every day | ORAL | Status: DC

## 2012-12-28 MED ORDER — ONDANSETRON 4 MG PO TBDP
8.0000 mg | ORAL_TABLET | Freq: Once | ORAL | Status: AC
  Administered 2012-12-28: 8 mg via ORAL
  Filled 2012-12-28: qty 2

## 2012-12-28 MED ORDER — IBUPROFEN 400 MG PO TABS
800.0000 mg | ORAL_TABLET | Freq: Once | ORAL | Status: AC
  Administered 2012-12-28: 800 mg via ORAL
  Filled 2012-12-28: qty 2

## 2012-12-28 MED ORDER — ONDANSETRON 8 MG PO TBDP: ORAL_TABLET | ORAL | Status: DC

## 2012-12-28 MED ORDER — AZITHROMYCIN 250 MG PO TABS
500.0000 mg | ORAL_TABLET | Freq: Once | ORAL | Status: AC
  Administered 2012-12-28: 500 mg via ORAL
  Filled 2012-12-28: qty 2

## 2012-12-28 NOTE — ED Provider Notes (Signed)
Medical screening examination/treatment/procedure(s) were performed by non-physician practitioner and as supervising physician I was immediately available for consultation/collaboration.    Olivia Mackie, MD 12/28/12 [phone]

## 2012-12-29 ENCOUNTER — Encounter (HOSPITAL_COMMUNITY): Payer: Self-pay | Admitting: Emergency Medicine

## 2012-12-29 ENCOUNTER — Inpatient Hospital Stay (HOSPITAL_COMMUNITY)
Admission: EM | Admit: 2012-12-29 | Discharge: 2012-12-31 | DRG: 194 | Disposition: A | Discharge status: Home / Self Care | Payer: Medicaid Other | Attending: Internal Medicine | Admitting: Internal Medicine

## 2012-12-29 ENCOUNTER — Emergency Department (HOSPITAL_COMMUNITY): Payer: Medicaid Other

## 2012-12-29 DIAGNOSIS — R042 Hemoptysis: Secondary | ICD-10-CM | POA: Diagnosis present

## 2012-12-29 DIAGNOSIS — F1411 Cocaine abuse, in remission: Secondary | ICD-10-CM

## 2012-12-29 DIAGNOSIS — I499 Cardiac arrhythmia, unspecified: Secondary | ICD-10-CM

## 2012-12-29 DIAGNOSIS — Z9889 Other specified postprocedural states: Secondary | ICD-10-CM

## 2012-12-29 DIAGNOSIS — IMO0002 Reserved for concepts with insufficient information to code with codable children: Secondary | ICD-10-CM

## 2012-12-29 DIAGNOSIS — I2510 Atherosclerotic heart disease of native coronary artery without angina pectoris: Secondary | ICD-10-CM

## 2012-12-29 DIAGNOSIS — G4700 Insomnia, unspecified: Secondary | ICD-10-CM | POA: Diagnosis present

## 2012-12-29 DIAGNOSIS — K219 Gastro-esophageal reflux disease without esophagitis: Secondary | ICD-10-CM | POA: Diagnosis present

## 2012-12-29 DIAGNOSIS — Z85118 Personal history of other malignant neoplasm of bronchus and lung: Secondary | ICD-10-CM

## 2012-12-29 DIAGNOSIS — R519 Headache, unspecified: Secondary | ICD-10-CM

## 2012-12-29 DIAGNOSIS — J449 Chronic obstructive pulmonary disease, unspecified: Secondary | ICD-10-CM

## 2012-12-29 DIAGNOSIS — Z720 Tobacco use: Secondary | ICD-10-CM

## 2012-12-29 DIAGNOSIS — M545 Low back pain, unspecified: Secondary | ICD-10-CM | POA: Diagnosis present

## 2012-12-29 DIAGNOSIS — J189 Pneumonia, unspecified organism: Principal | ICD-10-CM | POA: Diagnosis present

## 2012-12-29 DIAGNOSIS — G8929 Other chronic pain: Secondary | ICD-10-CM

## 2012-12-29 DIAGNOSIS — F1011 Alcohol abuse, in remission: Secondary | ICD-10-CM

## 2012-12-29 DIAGNOSIS — J438 Other emphysema: Secondary | ICD-10-CM | POA: Diagnosis present

## 2012-12-29 DIAGNOSIS — E785 Hyperlipidemia, unspecified: Secondary | ICD-10-CM | POA: Diagnosis present

## 2012-12-29 DIAGNOSIS — R9389 Abnormal findings on diagnostic imaging of other specified body structures: Secondary | ICD-10-CM

## 2012-12-29 DIAGNOSIS — F1410 Cocaine abuse, uncomplicated: Secondary | ICD-10-CM | POA: Diagnosis present

## 2012-12-29 DIAGNOSIS — F329 Major depressive disorder, single episode, unspecified: Secondary | ICD-10-CM | POA: Diagnosis present

## 2012-12-29 DIAGNOSIS — M129 Arthropathy, unspecified: Secondary | ICD-10-CM | POA: Diagnosis present

## 2012-12-29 DIAGNOSIS — Z01810 Encounter for preprocedural cardiovascular examination: Secondary | ICD-10-CM

## 2012-12-29 DIAGNOSIS — F3289 Other specified depressive episodes: Secondary | ICD-10-CM | POA: Diagnosis present

## 2012-12-29 DIAGNOSIS — Z902 Acquired absence of lung [part of]: Secondary | ICD-10-CM

## 2012-12-29 DIAGNOSIS — C3490 Malignant neoplasm of unspecified part of unspecified bronchus or lung: Secondary | ICD-10-CM | POA: Diagnosis present

## 2012-12-29 DIAGNOSIS — F17200 Nicotine dependence, unspecified, uncomplicated: Secondary | ICD-10-CM | POA: Diagnosis present

## 2012-12-29 DIAGNOSIS — R64 Cachexia: Secondary | ICD-10-CM | POA: Diagnosis present

## 2012-12-29 DIAGNOSIS — R918 Other nonspecific abnormal finding of lung field: Secondary | ICD-10-CM

## 2012-12-29 LAB — CBC WITH DIFFERENTIAL/PLATELET
Basophils Absolute: 0 10*3/uL (ref 0.0–0.1)
Basophils Relative: 0 % (ref 0–1)
Eosinophils Absolute: 0.2 10*3/uL (ref 0.0–0.7)
Eosinophils Relative: 3 % (ref 0–5)
HCT: 43.3 % (ref 39.0–52.0)
Hemoglobin: 14.8 g/dL (ref 13.0–17.0)
Lymphocytes Relative: 25 % (ref 12–46)
Lymphs Abs: 1.8 10*3/uL (ref 0.7–4.0)
MCH: 32.5 pg (ref 26.0–34.0)
MCHC: 34.2 g/dL (ref 30.0–36.0)
MCV: 95 fL (ref 78.0–100.0)
Monocytes Absolute: 0.8 10*3/uL (ref 0.1–1.0)
Monocytes Relative: 11 % (ref 3–12)
Neutro Abs: 4.3 10*3/uL (ref 1.7–7.7)
Neutrophils Relative %: 61 % (ref 43–77)
Platelets: 142 10*3/uL — ABNORMAL LOW (ref 150–400)
RBC: 4.56 MIL/uL (ref 4.22–5.81)
RDW: 13.9 % (ref 11.5–15.5)
WBC: 7 10*3/uL (ref 4.0–10.5)

## 2012-12-29 LAB — POCT I-STAT, CHEM 8
BUN: 12 mg/dL (ref 6–23)
Calcium, Ion: 1.24 mmol/L — ABNORMAL HIGH (ref 1.12–1.23)
Chloride: 104 mEq/L (ref 96–112)
Creatinine, Ser: 1.2 mg/dL (ref 0.50–1.35)
Glucose, Bld: 64 mg/dL — ABNORMAL LOW (ref 70–99)
HCT: 46 % (ref 39.0–52.0)
Hemoglobin: 15.6 g/dL (ref 13.0–17.0)
Potassium: 3.6 mEq/L (ref 3.5–5.1)
Sodium: 142 mEq/L (ref 135–145)
TCO2: 26 mmol/L (ref 0–100)

## 2012-12-29 LAB — CG4 I-STAT (LACTIC ACID): Lactic Acid, Venous: 2.45 mmol/L — ABNORMAL HIGH (ref 0.5–2.2)

## 2012-12-29 MED ORDER — ONDANSETRON HCL 4 MG PO TABS
4.0000 mg | ORAL_TABLET | Freq: Four times a day (QID) | ORAL | Status: DC | PRN
  Administered 2012-12-30: 4 mg via ORAL
  Filled 2012-12-29 (×2): qty 1

## 2012-12-29 MED ORDER — DEXTROSE 5 % IV SOLN
1.0000 g | Freq: Once | INTRAVENOUS | Status: AC
  Administered 2012-12-29: 1 g via INTRAVENOUS
  Filled 2012-12-29: qty 10

## 2012-12-29 MED ORDER — HYDROMORPHONE HCL PF 1 MG/ML IJ SOLN
1.0000 mg | INTRAMUSCULAR | Status: DC | PRN
  Administered 2012-12-29: 1 mg via INTRAVENOUS
  Filled 2012-12-29: qty 1

## 2012-12-29 MED ORDER — MONTELUKAST SODIUM 10 MG PO TABS
10.0000 mg | ORAL_TABLET | Freq: Every day | ORAL | Status: DC
  Administered 2012-12-29 – 2012-12-30 (×2): 10 mg via ORAL
  Filled 2012-12-29 (×3): qty 1

## 2012-12-29 MED ORDER — ONDANSETRON HCL 4 MG/2ML IJ SOLN: 4.0000 mg | Freq: Four times a day (QID) | INTRAMUSCULAR | Status: DC | PRN

## 2012-12-29 MED ORDER — DEXTROSE 5 % IV SOLN
1.0000 g | INTRAVENOUS | Status: DC
  Administered 2012-12-30: 1 g via INTRAVENOUS
  Filled 2012-12-29 (×2): qty 10

## 2012-12-29 MED ORDER — DOCUSATE SODIUM 100 MG PO CAPS: 100.0000 mg | ORAL_CAPSULE | Freq: Two times a day (BID) | ORAL | Status: DC

## 2012-12-29 MED ORDER — SODIUM CHLORIDE 0.9 % IV BOLUS (SEPSIS)
2000.0000 mL | Freq: Once | INTRAVENOUS | Status: AC
  Administered 2012-12-29: 2000 mL via INTRAVENOUS

## 2012-12-29 MED ORDER — CEFTRIAXONE SODIUM 1 G IJ SOLR: 1.0000 g | Freq: Once | INTRAMUSCULAR | Status: DC

## 2012-12-29 MED ORDER — MORPHINE SULFATE 4 MG/ML IJ SOLN
4.0000 mg | Freq: Once | INTRAMUSCULAR | Status: AC
  Administered 2012-12-29: 4 mg via INTRAVENOUS
  Filled 2012-12-29: qty 1

## 2012-12-29 MED ORDER — DEXTROSE-NACL 5-0.9 % IV SOLN
INTRAVENOUS | Status: DC
  Administered 2012-12-29: 23:00:00 via INTRAVENOUS

## 2012-12-29 MED ORDER — ACETAMINOPHEN 500 MG PO TABS
1000.0000 mg | ORAL_TABLET | Freq: Four times a day (QID) | ORAL | Status: DC | PRN
  Administered 2012-12-30 – 2012-12-31 (×2): 1000 mg via ORAL
  Filled 2012-12-29 (×3): qty 2

## 2012-12-29 MED ORDER — DEXTROSE 5 % IV SOLN
500.0000 mg | INTRAVENOUS | Status: DC
  Administered 2012-12-29 – 2012-12-30 (×2): 500 mg via INTRAVENOUS
  Filled 2012-12-29 (×3): qty 500
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
## 2012-12-29 NOTE — ED Notes (Signed)
Pt c/o abd pain that is similar to when seen here 2 days ago and back pain with some chills and bloody sputum with cough; pt sts hx of lobectomy

## 2012-12-29 NOTE — Progress Notes (Signed)
Pt received on unit. Alert and oriented. Call bell placed within reached.

## 2012-12-29 NOTE — ED Notes (Signed)
Pt requested to have blood work drawn with IV start
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
## 2012-12-29 NOTE — ED Notes (Signed)
Admitting MD at bedside.

## 2012-12-29 NOTE — ED Notes (Addendum)
Pt ambulated to restroom, c/o SOB. Pt found to be 90% on RA. Placed on 2L [place] and increased to 96%.

## 2012-12-30 ENCOUNTER — Inpatient Hospital Stay (HOSPITAL_COMMUNITY): Payer: Medicaid Other

## 2012-12-30 LAB — TSH: TSH: 2.424 u[IU]/mL (ref 0.350–4.500)

## 2012-12-30 LAB — STREP PNEUMONIAE URINARY ANTIGEN: Strep Pneumo Urinary Antigen: NEGATIVE

## 2012-12-30 MED ORDER — POLYETHYLENE GLYCOL 3350 17 G PO PACK
17.0000 g | PACK | Freq: Every day | ORAL | Status: DC | PRN
  Filled 2012-12-30: qty 1

## 2012-12-30 MED ORDER — DOCUSATE SODIUM 100 MG PO CAPS: 100.0000 mg | ORAL_CAPSULE | Freq: Two times a day (BID) | ORAL | Status: DC | PRN

## 2012-12-30 MED ORDER — DOCUSATE SODIUM 100 MG PO CAPS
100.0000 mg | ORAL_CAPSULE | Freq: Two times a day (BID) | ORAL | Status: DC
  Administered 2012-12-30 (×2): 100 mg via ORAL
  Filled 2012-12-30 (×4): qty 1

## 2012-12-30 MED ORDER — NICOTINE 14 MG/24HR TD PT24
14.0000 mg | MEDICATED_PATCH | Freq: Every day | TRANSDERMAL | Status: DC
  Administered 2012-12-30 – 2012-12-31 (×2): 14 mg via TRANSDERMAL
  Filled 2012-12-30 (×2): qty 1

## 2012-12-30 MED ORDER — FLUTICASONE PROPIONATE 50 MCG/ACT NA SUSP
1.0000 | Freq: Every day | NASAL | Status: DC
  Administered 2012-12-30 – 2012-12-31 (×2): 1 via NASAL
  Filled 2012-12-30: qty 16

## 2012-12-30 MED ORDER — BENZONATATE 100 MG PO CAPS
100.0000 mg | ORAL_CAPSULE | Freq: Three times a day (TID) | ORAL | Status: DC
  Administered 2012-12-30 – 2012-12-31 (×4): 100 mg via ORAL
  Filled 2012-12-30 (×6): qty 1

## 2012-12-30 NOTE — Progress Notes (Signed)
Pt back from CT

## 2012-12-30 NOTE — Progress Notes (Signed)
Pt transported to CT ?

## 2012-12-30 NOTE — Progress Notes (Signed)
Pt had small amount of hemoptysis. MD floor cover notified. Will continue to monitor.

## 2012-12-30 NOTE — Progress Notes (Signed)
Subjective: Hungry. Still with cough, but not bloody. Some dyspnea.  Objective: Vital signs in last 24 hours: Temp:  [97.4 F (36.3 C)-99.1 F (37.3 C)] 98 F (36.7 C) (11/27 0500) Pulse Rate:  [33-78] 64 (11/27 0500) Resp:  [12-20] 18 (11/27 0500) BP: (113-140)/(63-83) 124/65 mmHg (11/27 0500) SpO2:  [79 %-99 %] 96 % (11/27 0500) Weight:  [68.176 kg (150 lb 4.8 oz)-70 kg (154 lb 5.2 oz)] 70 kg (154 lb 5.2 oz) (11/26 2210) Weight change:  Last BM Date: 12/29/12  Intake/Output from previous day: 11/26 0701 - 11/27 0700 In: 2000 [I.V.:2000] Out: -  Intake/Output this shift:   General: Asleep. Arousable. Oriented. Oxygen off. Breathing nonlabored. Lungs: Clear to auscultation bilaterally without wheeze rhonchi or rales Cardiovascular: Regular rate rhythm without murmurs gallops rubs Abdomen soft nontender nondistended Extremities: No clubbing cyanosis or edema.  Lab Results:  Recent Labs  12/27/12 2351 12/29/12 1632 12/29/12 1924  WBC 10.5 7.0  --   HGB 14.6 14.8 15.6  HCT 42.8 43.3 46.0  PLT 130* 142*  --    BMET  Recent Labs  12/27/12 2351 12/29/12 1924  NA 133* 142  K 3.8 3.6  CL 99 104  CO2 21  --   GLUCOSE 195* 64*  BUN 12 12  CREATININE 0.98 1.20  CALCIUM 8.9  --     Studies/Results: Dg Chest 2 View  12/29/2012   CLINICAL DATA:  Cough for 1 week  EXAM: CHEST  2 VIEW  COMPARISON:  12/28/2012  FINDINGS: Cardiac shadow is stable. The lungs are well aerated bilaterally. Some changes are noted in the left lung base stable from the prior study. No new focal abnormality is seen. No acute bony abnormality is noted. Postsurgical changes are again seen.  IMPRESSION: Changes in the left lung base stable from the previous day. No new focal abnormality is seen.   Electronically Signed   By: Alcide Clever M.D.   On: 12/29/2012 16:50   Ct Chest Wo Contrast  12/30/2012   CLINICAL DATA:  Productive cough.  Fever.  Chest pain.  Hemoptysis.  EXAM: CT CHEST WITHOUT  CONTRAST  TECHNIQUE: Multidetector CT imaging of the chest was performed following the standard protocol without IV contrast.  COMPARISON:  04/09/2011  FINDINGS: Bilateral lower lobe infiltrates are seen, as well as infiltrate in the inferior aspect of the right middle lobe. This is suspicious for multilobar pneumonia. There is no evidence of central endobronchial obstruction. No discrete pulmonary nodules or masses are identified. Mild to moderate emphysema again noted.  No evidence of pleural or pericardial effusion. No evidence of centrally obstructing hilar mass or lymphadenopathy on this noncontrast study. A 9 mm right paratracheal mediastinal lymph node is unchanged since prior exam. No evidence of chest wall mass or suspicious bone lesions.  IMPRESSION: Bilateral lower lobe and right middle lobe infiltrates, suspicious for multilobar pneumonia. Post treatment radiographic or CT followup recommended to confirm resolution.  Mild to moderate emphysema.  No mass or lymphadenopathy identified.   Electronically Signed   By: Myles Rosenthal M.D.   On: 12/30/2012 00:42    Medications:  Scheduled Meds: . azithromycin  500 mg Intravenous Q24H  . cefTRIAXone (ROCEPHIN)  IV  1 g Intravenous Q24H  . docusate sodium  100 mg Oral BID  . montelukast  10 mg Oral QHS   Continuous Infusions: . dextrose 5 % and 0.9% NaCl 75 mL/hr at 12/29/12 2246   PRN Meds:.acetaminophen, HYDROmorphone (DILAUDID) injection, ondansetron (ZOFRAN) IV, ondansetron  Assessment/Plan: Principal Problem:   CAP (community acquired pneumonia) Active Problems:   Lung cancer   Hemoptysis  Continue antibiotics. Start diet. Check urine Legionella and strep pneumonia antigens. Goes to [facility] for primary care needs.   LOS: 1 day   [name] 12/30/2012, 10:59 AM

## 2012-12-31 ENCOUNTER — Other Ambulatory Visit (HOSPITAL_COMMUNITY): Payer: Medicaid Other

## 2012-12-31 DIAGNOSIS — F17200 Nicotine dependence, unspecified, uncomplicated: Secondary | ICD-10-CM

## 2012-12-31 LAB — LEGIONELLA ANTIGEN, URINE: Legionella Antigen, Urine: NEGATIVE

## 2012-12-31 MED ORDER — CEFUROXIME AXETIL 500 MG PO TABS: 500.0000 mg | ORAL_TABLET | Freq: Two times a day (BID) | ORAL | Status: DC

## 2012-12-31 MED ORDER — BENZONATATE 100 MG PO CAPS: 100.0000 mg | ORAL_CAPSULE | Freq: Three times a day (TID) | ORAL | Status: DC | PRN

## 2012-12-31 NOTE — Progress Notes (Signed)
SATURATION QUALIFICATIONS: (This note is used to comply with regulatory documentation for home oxygen)  Patient Saturations on Room Air at Rest = 91%  Patient Saturations on Room Air while Ambulating = 86%  Patient Saturations on 2 Liters of oxygen while Ambulating = 93%  Please briefly explain why patient needs home oxygen: 

## 2012-12-31 NOTE — Discharge Summary (Signed)
Physician Discharge Summary  [name] RUE:[id number] DOB: 1955-12-09 DOA: 12/29/2012  PCP: No PCP Per Patient  Admit date: 12/29/2012 Discharge date: 12/31/2012  Time spent: greater than 30 minutes  Recommendations for Outpatient Follow-up:  1. Repeat CXR in 4-6 weeks  Discharge Diagnoses:  Principal Problem:   CAP (community acquired pneumonia) Active Problems:   Lung cancer   Hemoptysis tobacco abuse  Discharge Condition: stable  Filed Weights   12/29/12 1628 12/29/12 2210  Weight: 68.176 kg (150 lb 4.8 oz) 70 kg (154 lb 5.2 oz)    History of present illness:  57 y.o. male with hx of lung CA, status post left upper lobectomy on 06/12/2011 for a stage IB (T2A, N0, M0) non-small cell lung cancer consistent with adenocarcinoma, hx of tobacco abuse, chronic low back pain, COPD, hyperlipidemia, depression, presents to the ER with one week hx of yellow sputum productive cough, followed by small amount of hemoptysis. He had increased shortness of breath of late as well. There were some chest pain with coughing, but no SSCP and no pleuritic chest pain. Evalaution in the ER included a CXR which showed possible PNA. A CT without contrast of his chest showed multilobar infiltrate, but no evidence of mass or recurrence of his lung cancer. He has no leukocytotis, normal Hb, normal renal fx tests. Hospitalist was asked to admit him for community acquired PNA. He was seen in the ER and has been started on Z pak. PCP is [facility].  Hospital Course:  Started on rocephin, azithromycin, oxygen and supportive care.  Dyspnea improved. Cough improved, hemoptysis resolved.  By discharge, normal vital signs, ambulating without oxygen and requesting discharge.  Counseled to quit smoking.  Procedures:  none  Consultations:  none  Discharge Exam: Filed Vitals:   12/31/12 0500  BP: 113/68  Pulse: 53  Temp: 98.1 F (36.7 C)  Resp: 18    General: alert and oriented. Cardiovascular:  RRR without MGR Respiratory: CTA without WRR  Discharge Instructions  Discharge Orders   Future Orders Complete By Expires   Diet general  As directed    Discharge instructions  As directed    Comments:     QUIT SMOKING   Increase activity slowly  As directed        Medication List         acetaminophen 500 MG tablet  Commonly known as:  TYLENOL  Take 1,000 mg by mouth every 6 (six) hours as needed for mild pain.     ADVIL PM 200-25 MG Caps  Generic drug:  Ibuprofen-Diphenhydramine HCl  Take 2 tablets by mouth at bedtime as needed (for pain).     azithromycin 250 MG tablet  Commonly known as:  ZITHROMAX  Take 250-500 mg by mouth daily. Zpk, takes 500mg  day 1, then 250mg  days 2-5     benzonatate 100 MG capsule  Commonly known as:  TESSALON  Take 1 capsule (100 mg total) by mouth 3 (three) times daily as needed for cough.     cefUROXime 500 MG tablet  Commonly known as:  CEFTIN  Take 1 tablet (500 mg total) by mouth 2 (two) times daily with a meal.     fluticasone 50 MCG/ACT nasal spray  Commonly known as:  FLONASE  Place 1 spray into both nostrils daily.     montelukast 10 MG tablet  Commonly known as:  SINGULAIR  Take 10 mg by mouth at bedtime.     NYQUIL COLD & FLU PO  Take  2 capsules by mouth at bedtime as needed (for cold).       No Known Allergies     Follow-up Information   Follow up with [facility] In 4 weeks. (FOR REPEAT CHEST XRAY)    Contact information:   [address] [place] [address] [phone]       The results of significant diagnostics from this hospitalization (including imaging, microbiology, ancillary and laboratory) are listed below for reference.    Significant Diagnostic Studies: Dg Chest 2 View  12/29/2012   CLINICAL DATA:  Cough for 1 week  EXAM: CHEST  2 VIEW  COMPARISON:  12/28/2012  FINDINGS: Cardiac shadow is stable. The lungs are well aerated bilaterally. Some changes are noted in the left lung base  stable from the prior study. No new focal abnormality is seen. No acute bony abnormality is noted. Postsurgical changes are again seen.  IMPRESSION: Changes in the left lung base stable from the previous day. No new focal abnormality is seen.   Electronically Signed   By: Alcide Clever M.D.   On: 12/29/2012 16:50   Dg Chest 2 View  12/28/2012   CLINICAL DATA:  Cough.  Shortness of breath.  Chest pain.  COPD.  EXAM: CHEST  2 VIEW  COMPARISON:  11/10/2012  FINDINGS: Heart size remains within normal limits. Changes of COPD are again seen. Left lower lobe scarring again demonstrated, and surgical clips again seen in the left hilum.  New asymmetric opacity is seen in the retrocardiac left lower lobe, suspicious for pneumonia. Right lung remains clear. No evidence of pleural effusion. No mass or lymphadenopathy identified.  IMPRESSION: New asymmetric opacity in retrocardiac left lower lobe, suspicious for pneumonia. Recommend short-term radiographic followup in several weeks to confirm resolution.  COPD.   Electronically Signed   By: Myles Rosenthal M.D.   On: 12/28/2012 00:29   Ct Chest Wo Contrast  12/30/2012   CLINICAL DATA:  Productive cough.  Fever.  Chest pain.  Hemoptysis.  EXAM: CT CHEST WITHOUT CONTRAST  TECHNIQUE: Multidetector CT imaging of the chest was performed following the standard protocol without IV contrast.  COMPARISON:  04/09/2011  FINDINGS: Bilateral lower lobe infiltrates are seen, as well as infiltrate in the inferior aspect of the right middle lobe. This is suspicious for multilobar pneumonia. There is no evidence of central endobronchial obstruction. No discrete pulmonary nodules or masses are identified. Mild to moderate emphysema again noted.  No evidence of pleural or pericardial effusion. No evidence of centrally obstructing hilar mass or lymphadenopathy on this noncontrast study. A 9 mm right paratracheal mediastinal lymph node is unchanged since prior exam. No evidence of chest wall mass  or suspicious bone lesions.  IMPRESSION: Bilateral lower lobe and right middle lobe infiltrates, suspicious for multilobar pneumonia. Post treatment radiographic or CT followup recommended to confirm resolution.  Mild to moderate emphysema.  No mass or lymphadenopathy identified.   Electronically Signed   By: Myles Rosenthal M.D.   On: 12/30/2012 00:42   US Abdomen Complete  12/28/2012   CLINICAL DATA:  Right upper quadrant pain.  EXAM: ULTRASOUND ABDOMEN COMPLETE  COMPARISON:  None.  FINDINGS: Gallbladder  No gallstones or wall thickening visualized. No sonographic Murphy sign noted.  Common bile duct  Diameter: 3.8 mm, normal  Liver  No focal lesion identified. Within normal limits in parenchymal echogenicity.  IVC  No abnormality visualized.  Pancreas  The pancreatic duct is mildly dilated, measuring 4 mm. No obstructing lesion is identified.  Changes could be due to pancreatitis or occult obstructing lesion or stone. No bile duct dilatation.  Spleen  Size and appearance within normal limits.  Right Kidney  Length: 9.9 cm. Echogenicity within normal limits. No mass or hydronephrosis visualized.  Left Kidney  Length: 9.1 cm. Echogenicity within normal limits. No mass or hydronephrosis visualized.  Abdominal aorta  No aneurysm visualized.  IMPRESSION: Nonspecific mild dilatation of the pancreatic duct. No cause is identified. Changes could be due to pancreatitis or occult obstructing lesion or stone. Consider follow-up with elective MRCP. Examination is otherwise unremarkable.   Electronically Signed   By: Burman Nieves M.D.   On: 12/28/2012 01:25    Microbiology: No results found for this or any previous visit (from the past 240 hour(s)).   Labs: Basic Metabolic Panel:  Recent Labs Lab 12/27/12 2351 12/29/12 1924  NA 133* 142  K 3.8 3.6  CL 99 104  CO2 21  --   GLUCOSE 195* 64*  BUN 12 12  CREATININE 0.98 1.20  CALCIUM 8.9  --    Liver Function Tests:  Recent Labs Lab 12/27/12 2351   AST 23  ALT 17  ALKPHOS 115  BILITOT 0.8  PROT 6.9  ALBUMIN 3.6    Recent Labs Lab 12/27/12 2351  LIPASE 23   No results found for this basename: AMMONIA,  in the last 168 hours CBC:  Recent Labs Lab 12/27/12 2351 12/29/12 1632 12/29/12 1924  WBC 10.5 7.0  --   NEUTROABS 9.0* 4.3  --   HGB 14.6 14.8 15.6  HCT 42.8 43.3 46.0  MCV 93.9 95.0  --   PLT 130* 142*  --    Signed:  Felcia Huebert L  [facility] 12/31/2012, 8:59 AM

## 2012-12-31 NOTE — Progress Notes (Signed)
Patient discharge teaching given, including activity, diet, follow-up appoints, and medications. Patient verbalized understanding of all discharge instructions. IV access was d/c'd. Vitals are stable. Skin is intact except as charted in most recent assessments. Pt to be escorted out by NT, to be taken home by bus.

## 2012-12-31 NOTE — Progress Notes (Signed)
   CARE MANAGEMENT NOTE 12/31/2012  Patient:  [name], [name]   Account Number:  [id number]  Date Initiated:  12/31/2012  Documentation initiated by:  [facility]  Subjective/Objective Assessment:   COPD, pnuemonia     Action/Plan:   lives alone   Anticipated DC Date:  12/31/2012   Anticipated DC Plan:  HOME/SELF CARE      DC Planning Services  CM consult  Medication Assistance      Choice offered to / List presented to:     DME arranged  [facility]      DME agency  [facility]        Status of service:  Completed, signed off Medicare Important Message given?   (If response is "NO", the following Medicare IM given date fields will be blank) Date Medicare IM given:   Date Additional Medicare IM given:    Discharge Disposition:  HOME/SELF CARE  Per UR Regulation:    If discussed at Long Length of Stay Meetings, dates discussed:    Comments:  12/31/2012 1100 NCM spoke to pt and states he could not afford copay for his meds. He goes to [facility] on [address] [place]. NCM picked up meds from [employer] [facility] RN reviewed with pt how to take medications and importance of compliance to help reduce futher complications post dc. [facility] was able to waive offer one time copay waiver for his meds. Explained to pt importance of compliance with oxygen, meds and follow up with his PCP. Isidoro Donning RN CCM Case Mgmt phone [phone]

## 2013-03-11 ENCOUNTER — Encounter: Payer: Self-pay | Admitting: Neurology

## 2013-03-15 ENCOUNTER — Encounter: Payer: Self-pay | Admitting: Neurology

## 2013-03-15 ENCOUNTER — Ambulatory Visit: Payer: Medicaid Other | Admitting: Neurology

## 2013-06-16 ENCOUNTER — Other Ambulatory Visit: Payer: Self-pay

## 2013-06-16 ENCOUNTER — Other Ambulatory Visit: Payer: Self-pay | Admitting: Surgery

## 2013-06-16 DIAGNOSIS — D381 Neoplasm of uncertain behavior of trachea, bronchus and lung: Secondary | ICD-10-CM

## 2013-06-22 ENCOUNTER — Encounter: Payer: Self-pay | Admitting: Surgery

## 2013-06-22 ENCOUNTER — Ambulatory Visit
Admission: RE | Admit: 2013-06-22 | Discharge: 2013-06-22 | Disposition: A | Discharge status: Home / Self Care | Payer: Medicaid Other | Source: Ambulatory Visit | Attending: Surgery | Admitting: Surgery

## 2013-06-22 ENCOUNTER — Ambulatory Visit (INDEPENDENT_AMBULATORY_CARE_PROVIDER_SITE_OTHER): Payer: Medicaid Other | Admitting: Surgery

## 2013-06-22 VITALS — BP 101/74 | HR 50 | Resp 20 | Ht 69.0 in | Wt 154.0 lb

## 2013-06-22 DIAGNOSIS — D381 Neoplasm of uncertain behavior of trachea, bronchus and lung: Secondary | ICD-10-CM

## 2013-06-22 DIAGNOSIS — Z902 Acquired absence of lung [part of]: Secondary | ICD-10-CM

## 2013-06-22 DIAGNOSIS — Z9889 Other specified postprocedural states: Secondary | ICD-10-CM

## 2013-06-22 DIAGNOSIS — C3490 Malignant neoplasm of unspecified part of unspecified bronchus or lung: Secondary | ICD-10-CM

## 2013-06-23 ENCOUNTER — Encounter: Payer: Self-pay | Admitting: Surgery

## 2013-06-23 NOTE — Progress Notes (Signed)
      HPI:  The patient returns for followup status post left upper lobectomy on 06/12/2011 for a stage IB (T2A, N0, M0) non-small cell lung cancer consistent with adenocarcinoma. This was a 2.0 cm tumor with visceral pleural invasion. The patient was seen in oncologic consultation by Dr. Julien Nordmann and he did not think there was any indication for adjuvant therapy at that time. He has had no shortness of breath. He has mild left chest wall tightness. It is not made worse by breathing. He denies any cough or sputum production. He has had no hemoptysis. He has continued to smoke. He denies any headaches or visual changes. He denies any bone pain. He is eating well and his weight is stable.   Current Outpatient Prescriptions  Medication Sig Dispense Refill  . acetaminophen (TYLENOL) 500 MG tablet Take 1,000 mg by mouth every 6 (six) hours as needed for mild pain.      . fluticasone (FLONASE) 50 MCG/ACT nasal spray Place 1 spray into both nostrils daily.      . hydrALAZINE (APRESOLINE) 25 MG tablet Take 25 mg by mouth at bedtime.      . Ibuprofen-Diphenhydramine HCl (ADVIL PM) 200-25 MG CAPS Take 2 tablets by mouth at bedtime as needed (for pain).      . montelukast (SINGULAIR) 10 MG tablet Take 10 mg by mouth at bedtime.       No current facility-administered medications for this visit.     Physical Exam: BP 101/74  Pulse 50  Resp 20  Ht 5\' 9"  (1.753 m)  Wt 154 lb (69.854 kg)  BMI 22.73 kg/m2  SpO2 96% He looks well  There is no cervical or supraclavicular adenopathy.  Lung exam is clear.  The left thoracotomy scar is unremarkable.  Abdominal exam shows active bowel sounds. Abdomen is soft, flat and nontender. There are no palpable masses or organomegaly.    Diagnostic Tests:  CLINICAL DATA: History of left upper lobectomy for resection of  adenocarcinoma in 2013, followup  EXAM:  CT CHEST WITHOUT CONTRAST  TECHNIQUE:  Multidetector CT imaging of the chest was performed  following the  standard protocol without IV contrast.  COMPARISON: CT chest of 12/30/2012  FINDINGS:  On lung window images, moderately severe changes of centrilobular  emphysema are noted diffusely. Linear scarring at the left lung base  is stable. Also there scarring in the right middle lobe  anteromedially. No new lung nodule is seen, and no pleural effusion  is noted. The central bronchi are widely patent.  On soft tissue window images, the thyroid gland is unremarkable. On  this unenhanced study, no mediastinal or hilar adenopathy is seen.  Coronary artery calcifications are noted diffusely. Cardiomegaly is  stable. The portion of the upper abdomen that is visualized is  unremarkable. No bony abnormality is seen.  IMPRESSION:  Stable postoperative changes on the left. Diffuse emphysema. No  evidence of recurrence of lung carcinoma.  Electronically Signed  By: Ivar Drape M.D.  On: 06/22/2013 15:26   Impression:   He continues to do well following left upper lobectomy for stage IB adenocarcinoma. I stressed the importance of not smoking.   Plan:   I will see him back in 6 months with a CT scan of the chest.

## 2013-07-12 ENCOUNTER — Encounter: Payer: Self-pay | Admitting: Neurology

## 2013-07-12 ENCOUNTER — Ambulatory Visit (INDEPENDENT_AMBULATORY_CARE_PROVIDER_SITE_OTHER): Payer: Medicaid Other | Admitting: Neurology

## 2013-07-12 VITALS — Ht 68.0 in | Wt 145.0 lb

## 2013-07-12 DIAGNOSIS — M542 Cervicalgia: Secondary | ICD-10-CM | POA: Insufficient documentation

## 2013-07-12 MED ORDER — GABAPENTIN (ONCE-DAILY) 300 MG PO TABS: 300.0000 mg | ORAL_TABLET | Freq: Three times a day (TID) | ORAL | Status: DC
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
## 2013-07-22 ENCOUNTER — Ambulatory Visit
Admission: RE | Admit: 2013-07-22 | Discharge: 2013-07-22 | Disposition: A | Discharge status: Home / Self Care | Payer: Medicaid Other | Source: Ambulatory Visit | Attending: Neurology | Admitting: Neurology

## 2013-07-22 DIAGNOSIS — M542 Cervicalgia: Secondary | ICD-10-CM

## 2013-07-26 ENCOUNTER — Telehealth: Payer: Self-pay | Admitting: Neurology

## 2013-07-26 ENCOUNTER — Ambulatory Visit (INDEPENDENT_AMBULATORY_CARE_PROVIDER_SITE_OTHER): Payer: Medicaid Other | Admitting: Neurology

## 2013-07-26 ENCOUNTER — Encounter (INDEPENDENT_AMBULATORY_CARE_PROVIDER_SITE_OTHER): Payer: Self-pay

## 2013-07-26 DIAGNOSIS — M542 Cervicalgia: Secondary | ICD-10-CM

## 2013-07-26 DIAGNOSIS — Z0289 Encounter for other administrative examinations: Secondary | ICD-10-CM

## 2013-07-26 DIAGNOSIS — M4712 Other spondylosis with myelopathy, cervical region: Secondary | ICD-10-CM
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
## 2013-07-26 NOTE — Procedures (Signed)
   NCS (NERVE CONDUCTION STUDY) WITH EMG (ELECTROMYOGRAPHY) REPORT   STUDY DATE: July 26 2013 PATIENT NAME: JENIEL SLAUSON DOB: 01-22-1956 MRN: [id number]    TECHNOLOGIST: Laretta Alstrom ELECTROMYOGRAPHER: Marcial Pacas M.D.  CLINICAL INFORMATION:  64 old gentleman, with previous history of neck injury, cervical decompression surgery, now presenting with right-sided neck pain, radiating down to her right shoulder, arm.  FINDINGS: NERVE CONDUCTION STUDY: Bilateral median, ulnar sensory and motor responses were normal  NEEDLE ELECTROMYOGRAPHY: Selected needle examination was performed at right upper extremity muscles, right cervical paraspinal muscles  Needle examination of right pronator teres, extensor digital communis, biceps, triceps, deltoid was normal.  Needle examination of right cervical paraspinal muscles was normal, including right C5, 6, 7  IMPRESSION:   This is a normal study. There was no electrodiagnostic evidence of right cervical radiculopathy, right upper extremity neuropathy   INTERPRETING PHYSICIAN:   Marcial Pacas M.D. Ph.D. [facility] [address] [place], [place] [address] [phone]

## 2013-08-02 NOTE — Telephone Encounter (Signed)
Error

## 2013-08-30 ENCOUNTER — Emergency Department (INDEPENDENT_AMBULATORY_CARE_PROVIDER_SITE_OTHER)
Admission: EM | Admit: 2013-08-30 | Discharge: 2013-08-30 | Disposition: A | Discharge status: Home / Self Care | Payer: Medicaid Other | Source: Home / Self Care | Attending: Family Medicine | Admitting: Family Medicine

## 2013-08-30 ENCOUNTER — Encounter (HOSPITAL_COMMUNITY): Payer: Self-pay | Admitting: Emergency Medicine

## 2013-08-30 DIAGNOSIS — M5030 Other cervical disc degeneration, unspecified cervical region: Secondary | ICD-10-CM

## 2013-08-30 MED ORDER — METHYLPREDNISOLONE 4 MG PO KIT: PACK | ORAL | Status: DC

## 2013-08-30 MED ORDER — METHYLPREDNISOLONE ACETATE 40 MG/ML IJ SUSP: 80.0000 mg | Freq: Once | INTRAMUSCULAR | Status: DC

## 2013-08-30 NOTE — Discharge Instructions (Signed)
See your neck specialist for further care.

## 2013-08-30 NOTE — ED Notes (Signed)
Pt has  Chronic  Neck  And  Upper  Back  Pain  He  Has  Been  Seeing  A  Neurologist         And  He  Continues  To  Have  Pain   -  He  Had  procedres  And  studys  Done  Last  Month   -  He  denys  Any  Recent  Injury  - he  Is  Sitting  Upright on  Exam  Table      Speaking in    Complete  sentances
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
## 2013-11-23 ENCOUNTER — Other Ambulatory Visit: Payer: Self-pay | Admitting: *Deleted

## 2013-11-23 DIAGNOSIS — R918 Other nonspecific abnormal finding of lung field: Secondary | ICD-10-CM

## 2013-12-21 ENCOUNTER — Other Ambulatory Visit: Payer: Self-pay | Admitting: *Deleted

## 2013-12-21 ENCOUNTER — Ambulatory Visit (INDEPENDENT_AMBULATORY_CARE_PROVIDER_SITE_OTHER): Payer: Medicaid Other | Admitting: Surgery

## 2013-12-21 ENCOUNTER — Other Ambulatory Visit: Payer: Medicaid Other

## 2013-12-21 ENCOUNTER — Ambulatory Visit
Admission: RE | Admit: 2013-12-21 | Discharge: 2013-12-21 | Disposition: A | Discharge status: Home / Self Care | Payer: Medicaid Other | Source: Ambulatory Visit | Attending: Surgery | Admitting: Surgery

## 2013-12-21 VITALS — BP 106/70 | HR 54 | Resp 16 | Ht 69.0 in | Wt 143.0 lb

## 2013-12-21 DIAGNOSIS — C3412 Malignant neoplasm of upper lobe, left bronchus or lung: Secondary | ICD-10-CM

## 2013-12-21 DIAGNOSIS — Z9889 Other specified postprocedural states: Secondary | ICD-10-CM

## 2013-12-21 DIAGNOSIS — Z902 Acquired absence of lung [part of]: Secondary | ICD-10-CM

## 2013-12-22 ENCOUNTER — Encounter: Payer: Self-pay | Admitting: Surgery

## 2013-12-22 NOTE — Progress Notes (Signed)
      HPI:  The patient returns for followup status post left upper lobectomy on 06/12/2011 for a stage IB (T2A, N0, M0) non-small cell lung cancer consistent with adenocarcinoma. This was a 2.0 cm tumor with visceral pleural invasion. The patient was seen in oncologic consultation by Dr. Julien Nordmann and he did not think there was any indication for adjuvant therapy at that time. He has had no shortness of breath. He has mild left chest wall tightness. It is not made worse by breathing. He denies any cough or sputum production. He has had no hemoptysis. He has continued to smoke. He denies any headaches or visual changes. 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 He is eating well and his weight is stable.  Current Outpatient Prescriptions  Medication Sig Dispense Refill  . acetaminophen (TYLENOL) 500 MG tablet Take 1,000 mg by mouth every 6 (six) hours as needed for mild pain.    . fluticasone (FLONASE) 50 MCG/ACT nasal spray Place 1 spray into both nostrils daily.    . Gabapentin, PHN, 300 MG TABS Take 300 mg by mouth 3 (three) times daily. 90 tablet 12  . hydrALAZINE (APRESOLINE) 25 MG tablet Take 50 mg by mouth at bedtime.     . Ibuprofen-Diphenhydramine HCl (ADVIL PM) 200-25 MG CAPS Take 2 tablets by mouth at bedtime as needed (for pain).     No current facility-administered medications for this visit.     Physical Exam: BP 106/70 mmHg  Pulse 54  Resp 16  Ht 5\' 9"  (1.753 m)  Wt 143 lb (64.864 kg)  BMI 21.11 kg/m2  SpO2 96% He looks well  There is no cervical or supraclavicular adenopathy.  Lung exam is clear.  The left thoracotomy scar is unremarkable.  Abdominal exam shows active bowel sounds. Abdomen is soft, flat and nontender. There are no palpable masses or organomegaly.    Diagnostic Tests:  CLINICAL DATA: 58 year old male with history of lung cancer status post left upper  lobectomy.  EXAM: CHEST 2 VIEW  COMPARISON: Chest x-ray 12/29/2012.  FINDINGS: Postoperative changes of left upper lobectomy with some mild chronic architectural distortion in the left hemithorax, similar to prior examinations. No confluent consolidative airspace disease. No pleural effusion. Mild emphysematous changes in the lungs. No evidence of pulmonary edema. Heart size is normal. Surgical clips in the left hilar region. Upper mediastinal contours are otherwise within normal limits.  IMPRESSION: 1. Status post left upper lobectomy with stable postoperative appearance of the thorax, as above.   Electronically Signed  By: Vinnie Langton M.D.  On: 12/21/2013 13:33  Impression:  He continues to do well following left upper lobectomy for stage IB adenocarcinoma. I stressed the importance of not smoking.   Plan:  I will see him back in 6 months with a CT scan of the chest.

## 2014-01-12 ENCOUNTER — Encounter (HOSPITAL_COMMUNITY): Payer: Self-pay | Admitting: Cardiovascular Disease

## 2014-01-25 ENCOUNTER — Ambulatory Visit: Payer: Medicaid Other | Admitting: Nurse Practitioner

## 2014-02-10 ENCOUNTER — Encounter (HOSPITAL_COMMUNITY): Payer: Self-pay | Admitting: *Deleted

## 2014-02-10 ENCOUNTER — Emergency Department (HOSPITAL_COMMUNITY): Payer: Medicaid Other

## 2014-02-10 ENCOUNTER — Emergency Department (HOSPITAL_COMMUNITY)
Admission: EM | Admit: 2014-02-10 | Discharge: 2014-02-10 | Disposition: A | Discharge status: Home / Self Care | Payer: Medicaid Other | Attending: Emergency Medicine | Admitting: Emergency Medicine

## 2014-02-10 DIAGNOSIS — F329 Major depressive disorder, single episode, unspecified: Secondary | ICD-10-CM | POA: Insufficient documentation

## 2014-02-10 DIAGNOSIS — Z8701 Personal history of pneumonia (recurrent): Secondary | ICD-10-CM | POA: Insufficient documentation

## 2014-02-10 DIAGNOSIS — Z85118 Personal history of other malignant neoplasm of bronchus and lung: Secondary | ICD-10-CM | POA: Insufficient documentation

## 2014-02-10 DIAGNOSIS — G8929 Other chronic pain: Secondary | ICD-10-CM | POA: Diagnosis not present

## 2014-02-10 DIAGNOSIS — G4700 Insomnia, unspecified: Secondary | ICD-10-CM | POA: Diagnosis not present

## 2014-02-10 DIAGNOSIS — R0602 Shortness of breath: Secondary | ICD-10-CM

## 2014-02-10 DIAGNOSIS — Z79899 Other long term (current) drug therapy: Secondary | ICD-10-CM | POA: Insufficient documentation

## 2014-02-10 DIAGNOSIS — E785 Hyperlipidemia, unspecified: Secondary | ICD-10-CM | POA: Diagnosis not present

## 2014-02-10 DIAGNOSIS — J069 Acute upper respiratory infection, unspecified: Secondary | ICD-10-CM | POA: Insufficient documentation

## 2014-02-10 DIAGNOSIS — Z7951 Long term (current) use of inhaled steroids: Secondary | ICD-10-CM | POA: Insufficient documentation

## 2014-02-10 DIAGNOSIS — I2510 Atherosclerotic heart disease of native coronary artery without angina pectoris: Secondary | ICD-10-CM | POA: Insufficient documentation

## 2014-02-10 DIAGNOSIS — Z8719 Personal history of other diseases of the digestive system: Secondary | ICD-10-CM | POA: Insufficient documentation

## 2014-02-10 DIAGNOSIS — Z720 Tobacco use: Secondary | ICD-10-CM | POA: Insufficient documentation

## 2014-02-10 LAB — COMPREHENSIVE METABOLIC PANEL
ALT: 16 U/L (ref 0–53)
AST: 22 U/L (ref 0–37)
Albumin: 3.6 g/dL (ref 3.5–5.2)
Alkaline Phosphatase: 108 U/L (ref 39–117)
Anion gap: 10 (ref 5–15)
BUN: 6 mg/dL (ref 6–23)
CO2: 26 mmol/L (ref 19–32)
Calcium: 8.9 mg/dL (ref 8.4–10.5)
Chloride: 104 mEq/L (ref 96–112)
Creatinine, Ser: 1.12 mg/dL (ref 0.50–1.35)
GFR calc Af Amer: 82 mL/min — ABNORMAL LOW (ref >90)
GFR calc non Af Amer: 71 mL/min — ABNORMAL LOW (ref >90)
Glucose, Bld: 114 mg/dL — ABNORMAL HIGH (ref 70–99)
Potassium: 3.8 mmol/L (ref 3.5–5.1)
Sodium: 140 mmol/L (ref 135–145)
Total Bilirubin: 0.5 mg/dL (ref 0.3–1.2)
Total Protein: 6.6 g/dL (ref 6.0–8.3)

## 2014-02-10 LAB — CBC WITH DIFFERENTIAL/PLATELET
Basophils Absolute: 0 10*3/uL (ref 0.0–0.1)
Basophils Relative: 1 % (ref 0–1)
Eosinophils Absolute: 0.4 10*3/uL (ref 0.0–0.7)
Eosinophils Relative: 7 % — ABNORMAL HIGH (ref 0–5)
HCT: 41.8 % (ref 39.0–52.0)
Hemoglobin: 13.8 g/dL (ref 13.0–17.0)
Lymphocytes Relative: 17 % (ref 12–46)
Lymphs Abs: 0.9 10*3/uL (ref 0.7–4.0)
MCH: 29.5 pg (ref 26.0–34.0)
MCHC: 33 g/dL (ref 30.0–36.0)
MCV: 89.3 fL (ref 78.0–100.0)
Monocytes Absolute: 0.6 10*3/uL (ref 0.1–1.0)
Monocytes Relative: 11 % (ref 3–12)
Neutro Abs: 3.2 10*3/uL (ref 1.7–7.7)
Neutrophils Relative %: 64 % (ref 43–77)
Platelets: 209 10*3/uL (ref 150–400)
RBC: 4.68 MIL/uL (ref 4.22–5.81)
RDW: 14.8 % (ref 11.5–15.5)
WBC: 5 10*3/uL (ref 4.0–10.5)

## 2014-02-10 LAB — I-STAT TROPONIN, ED: Troponin i, poc: 0 ng/mL (ref 0.00–0.08)

## 2014-02-10 MED ORDER — GUAIFENESIN-DM 100-10 MG/5ML PO SYRP: 5.0000 mL | ORAL_SOLUTION | Freq: Three times a day (TID) | ORAL | Status: DC | PRN
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
## 2014-02-10 NOTE — ED Notes (Signed)
Patient reports he has hx of pneumonia.  He is now having body aches, uri sx with chest pain and back pain.  He also has chills, sob, and sore throat.  Patient has not taken any meds for pain.  Patient is supposed to have oxygen at night.

## 2014-02-10 NOTE — ED Notes (Signed)
Dr Alvino Chapel at bedside.

## 2014-02-10 NOTE — Discharge Instructions (Signed)
Upper Respiratory Infection, Adult An upper respiratory infection (URI) is also sometimes known as the common cold. The upper respiratory tract includes the nose, sinuses, throat, trachea, and bronchi. Bronchi are the airways leading to the lungs. Most people improve within 1 week, but symptoms can last up to 2 weeks. A residual cough may last even longer.  CAUSES Many different viruses can infect the tissues lining the upper respiratory tract. The tissues become irritated and inflamed and often become very moist. Mucus production is also common. A cold is contagious. You can easily spread the virus to others by oral contact. This includes kissing, sharing a glass, coughing, or sneezing. Touching your mouth or nose and then touching a surface, which is then touched by another person, can also spread the virus. SYMPTOMS  Symptoms typically develop 1 to 3 days after you come in contact with a cold virus. Symptoms vary from person to person. They may include:  Runny nose.  Sneezing.  Nasal congestion.  Sinus irritation.  Sore throat.  Loss of voice (laryngitis).  Cough.  Fatigue.  Muscle aches.  Loss of appetite.  Headache.  Low-grade fever. DIAGNOSIS  You might diagnose your own cold based on familiar symptoms, since most people get a cold 2 to 3 times a year. Your caregiver can confirm this based on your exam. Most importantly, your caregiver can check that your symptoms are not due to another disease such as strep throat, sinusitis, pneumonia, asthma, or epiglottitis. Blood tests, throat tests, and X-rays are not necessary to diagnose a common cold, but they may sometimes be helpful in excluding other more serious diseases. Your caregiver will decide if any further tests are required. RISKS AND COMPLICATIONS  You may be at risk for a more severe case of the common cold if you smoke cigarettes, have chronic heart disease (such as heart failure) or lung disease (such as asthma), or if  you have a weakened immune system. The very young and very old are also at risk for more serious infections. Bacterial sinusitis, middle ear infections, and bacterial pneumonia can complicate the common cold. The common cold can worsen asthma and chronic obstructive pulmonary disease (COPD). Sometimes, these complications can require emergency medical care and may be life-threatening. PREVENTION  The best way to protect against getting a cold is to practice good hygiene. Avoid oral or hand contact with people with cold symptoms. Wash your hands often if contact occurs. There is no clear evidence that vitamin C, vitamin E, echinacea, or exercise reduces the chance of developing a cold. However, it is always recommended to get plenty of rest and practice good nutrition. TREATMENT  Treatment is directed at relieving symptoms. There is no cure. Antibiotics are not effective, because the infection is caused by a virus, not by bacteria. Treatment may include:  Increased fluid intake. Sports drinks offer valuable electrolytes, sugars, and fluids.  Breathing heated mist or steam (vaporizer or shower).  Eating chicken soup or other clear broths, and maintaining good nutrition.  Getting plenty of rest.  Using gargles or lozenges for comfort.  Controlling fevers with ibuprofen or acetaminophen as directed by your caregiver.  Increasing usage of your inhaler if you have asthma. Zinc gel and zinc lozenges, taken in the first 24 hours of the common cold, can shorten the duration and lessen the severity of symptoms. Pain medicines may help with fever, muscle aches, and throat pain. A variety of non-prescription medicines are available to treat congestion and runny nose. Your caregiver   can make recommendations and may suggest nasal or lung inhalers for other symptoms.  HOME CARE INSTRUCTIONS   Only take over-the-counter or prescription medicines for pain, discomfort, or fever as directed by your  caregiver.  Use a warm mist humidifier or inhale steam from a shower to increase air moisture. This may keep secretions moist and make it easier to breathe.  Drink enough water and fluids to keep your urine clear or pale yellow.  Rest as needed.  Return to work when your temperature has returned to normal or as your caregiver advises. You may need to stay home longer to avoid infecting others. You can also use a face mask and careful hand washing to prevent spread of the virus. SEEK MEDICAL CARE IF:   After the first few days, you feel you are getting worse rather than better.  You need your caregiver's advice about medicines to control symptoms.  You develop chills, worsening shortness of breath, or brown or red sputum. These may be signs of pneumonia.  You develop yellow or brown nasal discharge or pain in the face, especially when you bend forward. These may be signs of sinusitis.  You develop a fever, swollen neck glands, pain with swallowing, or white areas in the back of your throat. These may be signs of strep throat. SEEK IMMEDIATE MEDICAL CARE IF:   You have a fever.  You develop severe or persistent headache, ear pain, sinus pain, or chest pain.  You develop wheezing, a prolonged cough, cough up blood, or have a change in your usual mucus (if you have chronic lung disease).  You develop sore muscles or a stiff neck. Document Released: 07/16/2000 Document Revised: 04/14/2011 Document Reviewed: 04/27/2013 [employer] Patient Information 2015 [employer]. This information is not intended to replace advice given to you by your health care provider. Make sure you discuss any questions you have with your health care provider.  

## 2014-02-22 ENCOUNTER — Ambulatory Visit: Payer: Medicaid Other | Admitting: Nurse Practitioner

## 2014-05-12 ENCOUNTER — Emergency Department (HOSPITAL_COMMUNITY): Payer: Medicaid Other

## 2014-05-12 ENCOUNTER — Encounter (HOSPITAL_COMMUNITY): Payer: Self-pay

## 2014-05-12 ENCOUNTER — Emergency Department (HOSPITAL_COMMUNITY)
Admission: EM | Admit: 2014-05-12 | Discharge: 2014-05-12 | Disposition: A | Discharge status: Home / Self Care | Payer: Medicaid Other | Attending: Emergency Medicine | Admitting: Emergency Medicine

## 2014-05-12 DIAGNOSIS — I2510 Atherosclerotic heart disease of native coronary artery without angina pectoris: Secondary | ICD-10-CM | POA: Diagnosis not present

## 2014-05-12 DIAGNOSIS — M1990 Unspecified osteoarthritis, unspecified site: Secondary | ICD-10-CM | POA: Diagnosis not present

## 2014-05-12 DIAGNOSIS — R001 Bradycardia, unspecified: Secondary | ICD-10-CM | POA: Diagnosis not present

## 2014-05-12 DIAGNOSIS — R0789 Other chest pain: Secondary | ICD-10-CM | POA: Insufficient documentation

## 2014-05-12 DIAGNOSIS — Z8701 Personal history of pneumonia (recurrent): Secondary | ICD-10-CM | POA: Insufficient documentation

## 2014-05-12 DIAGNOSIS — Z9889 Other specified postprocedural states: Secondary | ICD-10-CM | POA: Diagnosis not present

## 2014-05-12 DIAGNOSIS — R079 Chest pain, unspecified: Secondary | ICD-10-CM

## 2014-05-12 DIAGNOSIS — Z79899 Other long term (current) drug therapy: Secondary | ICD-10-CM | POA: Insufficient documentation

## 2014-05-12 DIAGNOSIS — J439 Emphysema, unspecified: Secondary | ICD-10-CM | POA: Insufficient documentation

## 2014-05-12 DIAGNOSIS — G8929 Other chronic pain: Secondary | ICD-10-CM | POA: Insufficient documentation

## 2014-05-12 DIAGNOSIS — F329 Major depressive disorder, single episode, unspecified: Secondary | ICD-10-CM | POA: Diagnosis not present

## 2014-05-12 DIAGNOSIS — R05 Cough: Secondary | ICD-10-CM | POA: Diagnosis not present

## 2014-05-12 DIAGNOSIS — Z720 Tobacco use: Secondary | ICD-10-CM | POA: Diagnosis not present

## 2014-05-12 DIAGNOSIS — K219 Gastro-esophageal reflux disease without esophagitis: Secondary | ICD-10-CM | POA: Diagnosis not present

## 2014-05-12 DIAGNOSIS — Z85118 Personal history of other malignant neoplasm of bronchus and lung: Secondary | ICD-10-CM | POA: Insufficient documentation

## 2014-05-12 LAB — BASIC METABOLIC PANEL
Anion gap: 5 (ref 5–15)
BUN: 12 mg/dL (ref 6–23)
CO2: 28 mmol/L (ref 19–32)
Calcium: 9.2 mg/dL (ref 8.4–10.5)
Chloride: 106 mmol/L (ref 96–112)
Creatinine, Ser: 1.11 mg/dL (ref 0.50–1.35)
GFR calc Af Amer: 82 mL/min — ABNORMAL LOW (ref >90)
GFR calc non Af Amer: 71 mL/min — ABNORMAL LOW (ref >90)
Glucose, Bld: 56 mg/dL — ABNORMAL LOW (ref 70–99)
Potassium: 4.3 mmol/L (ref 3.5–5.1)
Sodium: 139 mmol/L (ref 135–145)

## 2014-05-12 LAB — RAPID URINE DRUG SCREEN, HOSP PERFORMED
Amphetamines: NOT DETECTED
Barbiturates: NOT DETECTED
Benzodiazepines: NOT DETECTED
Cocaine: NOT DETECTED
Opiates: NOT DETECTED
Tetrahydrocannabinol: NOT DETECTED

## 2014-05-12 LAB — CBC WITH DIFFERENTIAL/PLATELET
Basophils Absolute: 0 10*3/uL (ref 0.0–0.1)
Basophils Relative: 1 % (ref 0–1)
Eosinophils Absolute: 0.4 10*3/uL (ref 0.0–0.7)
Eosinophils Relative: 7 % — ABNORMAL HIGH (ref 0–5)
HCT: 43.2 % (ref 39.0–52.0)
Hemoglobin: 14.2 g/dL (ref 13.0–17.0)
Lymphocytes Relative: 33 % (ref 12–46)
Lymphs Abs: 2 10*3/uL (ref 0.7–4.0)
MCH: 29.8 pg (ref 26.0–34.0)
MCHC: 32.9 g/dL (ref 30.0–36.0)
MCV: 90.6 fL (ref 78.0–100.0)
Monocytes Absolute: 0.7 10*3/uL (ref 0.1–1.0)
Monocytes Relative: 11 % (ref 3–12)
Neutro Abs: 3 10*3/uL (ref 1.7–7.7)
Neutrophils Relative %: 50 % (ref 43–77)
Platelets: 269 10*3/uL (ref 150–400)
RBC: 4.77 MIL/uL (ref 4.22–5.81)
RDW: 14.2 % (ref 11.5–15.5)
WBC: 6.1 10*3/uL (ref 4.0–10.5)

## 2014-05-12 LAB — I-STAT TROPONIN, ED: Troponin i, poc: 0 ng/mL (ref 0.00–0.08)

## 2014-05-12 LAB — TROPONIN I: Troponin I: <0.03 ng/mL (ref <0.031)

## 2014-05-12 NOTE — ED Notes (Signed)
Per EMS: Pt from MD office, complains of chest pain 6/10 gave 1 nitro and 324 ASA. Pain down to a 4/10. Denies any N/V. No changes on EKG. Normally brady 40-50bpm. Sent for eval from MD office.

## 2014-05-12 NOTE — Discharge Instructions (Signed)
Follow-up to discuss further work up and possible stress test with local cardiologist. Return to the ER if you develop persistent chest pain, chest pain different than normal or other new concerns. Take baby aspirin each day until you see a heart doctor.  If you were given medicines take as directed.  If you are on coumadin or contraceptives realize their levels and effectiveness is altered by many different medicines.  If you have any reaction (rash, tongues swelling, other) to the medicines stop taking and see a physician.   Please follow up as directed and return to the ER or see a physician for new or worsening symptoms.  Thank you. Filed Vitals:   05/12/14 1630 05/12/14 1830 05/12/14 1900 05/12/14 1934  BP: 113/73 131/80 125/64 113/74  Pulse: 43  41 41  Temp:      TempSrc:      Resp: 14 16 20 17   SpO2: 98%  93% 94%
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
## 2014-05-25 ENCOUNTER — Other Ambulatory Visit: Payer: Self-pay | Admitting: *Deleted

## 2014-05-25 DIAGNOSIS — R918 Other nonspecific abnormal finding of lung field: Secondary | ICD-10-CM
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
## 2014-06-12 ENCOUNTER — Ambulatory Visit (INDEPENDENT_AMBULATORY_CARE_PROVIDER_SITE_OTHER): Payer: Medicaid Other | Admitting: Physician Assistant

## 2014-06-12 ENCOUNTER — Encounter: Payer: Self-pay | Admitting: Physician Assistant

## 2014-06-12 VITALS — BP 122/78 | HR 41 | Ht 68.0 in | Wt 150.0 lb

## 2014-06-12 DIAGNOSIS — I2510 Atherosclerotic heart disease of native coronary artery without angina pectoris: Secondary | ICD-10-CM

## 2014-06-12 DIAGNOSIS — R42 Dizziness and giddiness: Secondary | ICD-10-CM

## 2014-06-12 DIAGNOSIS — Z720 Tobacco use: Secondary | ICD-10-CM

## 2014-06-12 DIAGNOSIS — R079 Chest pain, unspecified: Secondary | ICD-10-CM

## 2014-06-12 DIAGNOSIS — C3412 Malignant neoplasm of upper lobe, left bronchus or lung: Secondary | ICD-10-CM | POA: Diagnosis not present

## 2014-06-12 DIAGNOSIS — R001 Bradycardia, unspecified: Secondary | ICD-10-CM

## 2014-06-12 MED ORDER — PRAVASTATIN SODIUM 20 MG PO TABS: 20.0000 mg | ORAL_TABLET | Freq: Every evening | ORAL | Status: DC

## 2014-06-12 NOTE — Patient Instructions (Signed)
Medication Instructions:  1) START taking Pravastatin '20mg'$  every night  Labwork: Your physician recommends that you return for lab work in: 6 weeks (LFT, Lipids)   Testing/Procedures: Your physician has recommended that you wear a 48 hour holter monitor. Holter monitors are medical devices that record the heart's electrical activity. Doctors most often use these monitors to diagnose arrhythmias. Arrhythmias are problems with the speed or rhythm of the heartbeat. The monitor is a small, portable device. You can wear one while you do your normal daily activities. This is usually used to diagnose what is causing palpitations/syncope (passing out).  Your physician has requested that you have en exercise stress myoview. For further information please visit [email or web address][email or web address]. Please follow instruction sheet, as given.   Follow-Up: Your physician recommends that you schedule a follow-up appointment in: 2-3 weeks with Dr. Fletcher Anon.   Any Other Special Instructions Will Be Listed Below (If Applicable).

## 2014-06-19 ENCOUNTER — Telehealth (HOSPITAL_COMMUNITY): Payer: Self-pay | Admitting: *Deleted

## 2014-06-19 NOTE — Telephone Encounter (Signed)
Left message on voicemail in reference to upcoming appointment scheduled for 06/22/14. Phone number given for a call back so details instructions can be given. Tanyiah Laurich, [name]

## 2014-06-20 ENCOUNTER — Telehealth (HOSPITAL_COMMUNITY): Payer: Self-pay | Admitting: *Deleted

## 2014-06-20 NOTE — Telephone Encounter (Signed)
Patient given detailed instructions per Myocardial Perfusion Study Information Sheet for test on 06/22/14 at 1130. Patient verbalized understanding. [name], [name]

## 2014-06-21 ENCOUNTER — Ambulatory Visit (INDEPENDENT_AMBULATORY_CARE_PROVIDER_SITE_OTHER): Payer: Medicaid Other | Admitting: Surgery

## 2014-06-21 ENCOUNTER — Encounter: Payer: Self-pay | Admitting: Surgery

## 2014-06-21 ENCOUNTER — Ambulatory Visit
Admission: RE | Admit: 2014-06-21 | Discharge: 2014-06-21 | Disposition: A | Discharge status: Home / Self Care | Payer: Medicaid Other | Source: Ambulatory Visit | Attending: Surgery | Admitting: Surgery

## 2014-06-21 VITALS — BP 106/73 | HR 44 | Resp 20 | Ht 68.0 in | Wt 150.0 lb

## 2014-06-21 DIAGNOSIS — C3412 Malignant neoplasm of upper lobe, left bronchus or lung: Secondary | ICD-10-CM | POA: Diagnosis not present

## 2014-06-21 DIAGNOSIS — R918 Other nonspecific abnormal finding of lung field: Secondary | ICD-10-CM

## 2014-06-21 DIAGNOSIS — Z902 Acquired absence of lung [part of]: Secondary | ICD-10-CM

## 2014-06-21 DIAGNOSIS — Z9889 Other specified postprocedural states: Secondary | ICD-10-CM | POA: Diagnosis not present

## 2014-06-21 NOTE — Progress Notes (Signed)
HPI:  The patient returns for followup status post left upper lobectomy on 06/12/2011 for a stage IB (T2A, N0, M0) non-small cell lung cancer consistent with adenocarcinoma. This was a 2.0 cm tumor with visceral pleural invasion. The patient was seen in oncologic consultation by Dr. Julien Nordmann and he did not think there was any indication for adjuvant therapy at that time. He has had no shortness of breath. He continues to have  mild left chest wall tightness. It is not made worse by breathing. He has also recently been having some substernal chest pressure and was referred to cardiology. He is scheduled for a nuclear stress test soon. Preoperative workup demonstrated inferior scar with peri-infarct ischemia. LHC demonstrated minimal coronary plaque. He denies any cough or sputum production. He has had no hemoptysis. He has continued to smoke. He denies any headaches or visual changes. 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 He is eating well and his weight is stable.  Current Outpatient Prescriptions  Medication Sig Dispense Refill  . acetaminophen (TYLENOL) 500 MG tablet Take 1,000 mg by mouth every 6 (six) hours as needed for mild pain.    [name] albuterol (PROVENTIL HFA;VENTOLIN HFA) 108 (90 BASE) MCG/ACT inhaler Inhale 2 puffs into the lungs every 6 (six) hours as needed for wheezing or shortness of breath.    [name] aspirin 81 MG tablet Take 81 mg by mouth daily.    . Gabapentin, PHN, 300 MG TABS Take 300 mg by mouth 3 (three) times daily. 90 tablet 12  . hydrALAZINE (APRESOLINE) 25 MG tablet Take 50 mg by mouth at bedtime.     . hydrOXYzine (ATARAX/VISTARIL) 50 MG tablet Take 50 mg by mouth at bedtime as needed.    . Ibuprofen-Diphenhydramine HCl (ADVIL PM) 200-25 MG CAPS Take 2 tablets by mouth at bedtime as needed (for pain).    [name] omeprazole (PRILOSEC) 20 MG capsule Take 20 mg by mouth daily.    . pravastatin  (PRAVACHOL) 20 MG tablet Take 1 tablet (20 mg total) by mouth every evening. 90 tablet 3   No current facility-administered medications for this visit.     Physical Exam:  BP 106/73 mmHg  Pulse 44  Resp 20  Ht '5\' 8"'$  (1.727 m)  Wt 150 lb (68.04 kg)  BMI 22.81 kg/m2  SpO2 95% He looks well  There is no cervical or supraclavicular adenopathy.  Lung exam is clear.  The left thoracotomy scar is unremarkable.  Abdominal exam shows active bowel sounds. Abdomen is soft, flat and nontender. There are no palpable masses or organomegaly.   Diagnostic Tests:  CLINICAL DATA: 59 year old male with a history of lung cancer. Left upper lobe resection.  EXAM: CT CHEST WITHOUT CONTRAST  TECHNIQUE: Multidetector CT imaging of the chest was performed following the standard protocol without IV contrast.  COMPARISON: Chest x-ray 05/12/2014, CT 06/22/2013  FINDINGS: Chest:  Unremarkable superficial soft tissues of the chest wall. No axillary or supraclavicular adenopathy.  Unremarkable appearance of the thoracic inlet. Unremarkable thyroid.  Mediastinal lymph nodes are present. High right peritracheal node measures 8 mm, unchanged from the comparison study. Low right peritracheal node measures 6 mm, unchanged from the comparison study.  Heart size within normal limits. No pericardial fluid/ thickening.  Calcifications of the left main, left anterior descending, circumflex, right coronary arteries.  Unremarkable appearance of the thoracic esophagus. No hiatal hernia.  Re- demonstration  of extensive changes of bilateral centrilobular and paraseptal emphysema. Architectural distortion present at the lung apices, anterior segment of the right upper lobe, bilateral lung bases.  Postsurgical changes of the left hilum again demonstrated. No evidence of abnormal soft tissue in the left hilum.  No confluent airspace disease. No pneumothorax or pleural  effusion.  Upper abdomen:Unremarkable appearance of the visualized upper abdomen.  No displaced fractures identified. Minimal degenerative changes of the visualized spine.  IMPRESSION: Similar appearance of postoperative changes of the left lung, with no evidence of local recurrence.  Extensive changes of emphysema with associated architectural distortion/scarring.  Coronary artery disease.  Signed,  Dulcy Fanny. Earleen Newport, DO  [facility]  [facility]   Electronically Signed  By: Corrie Mckusick D.O.  On: 06/21/2014 12:22   Impression:  He continues to do well following left upper lobectomy for stage IB adenocarcinoma. I stressed the importance of not smoking.   Plan:  I will see him back in 6 months with a CXR and will plan to do yearly low dose screening CT scan of the chest.     Gaye Pollack, MD [facility] [phone]

## 2014-06-22 ENCOUNTER — Ambulatory Visit (INDEPENDENT_AMBULATORY_CARE_PROVIDER_SITE_OTHER): Payer: Medicaid Other

## 2014-06-22 ENCOUNTER — Ambulatory Visit (HOSPITAL_COMMUNITY): Payer: Medicaid Other | Attending: Cardiology

## 2014-06-22 DIAGNOSIS — R42 Dizziness and giddiness: Secondary | ICD-10-CM | POA: Diagnosis not present

## 2014-06-22 DIAGNOSIS — R079 Chest pain, unspecified: Secondary | ICD-10-CM | POA: Insufficient documentation

## 2014-06-22 DIAGNOSIS — R9439 Abnormal result of other cardiovascular function study: Secondary | ICD-10-CM | POA: Insufficient documentation

## 2014-06-22 DIAGNOSIS — R0602 Shortness of breath: Secondary | ICD-10-CM | POA: Diagnosis not present

## 2014-06-22 DIAGNOSIS — R001 Bradycardia, unspecified: Secondary | ICD-10-CM | POA: Diagnosis not present

## 2014-06-22 LAB — MYOCARDIAL PERFUSION IMAGING
Estimated workload: 1 METS
Exercise duration (min): 2 min
Exercise duration (sec): 0 s
LV dias vol: 113 mL
LV sys vol: 64 mL
MPHR: 161 {beats}/min
Nuc Stress EF: 43 %
Peak BP: 107 mmHg
Peak HR: 45 {beats}/min
Percent HR: 52 %
Percent of predicted max HR: 27 %
RATE: 0.3
RPE: 20
Rest HR: 38 {beats}/min
SDS: 0
SRS: 10
SSS: 10
Stage 1 Grade: 0 %
Stage 1 HR: 55 {beats}/min
Stage 1 Speed: 0 mph
Stage 2 Grade: 0.2 %
Stage 2 HR: 56 {beats}/min
Stage 2 Speed: 0.1 mph
Stage 3 Grade: 10 %
Stage 3 HR: 81 {beats}/min
Stage 3 Speed: 1.7 mph
Stage 4 DBP: 66 mmHg
Stage 4 Grade: 10 %
Stage 4 HR: 45 {beats}/min
Stage 4 SBP: 107 mmHg
Stage 4 Speed: 0 mph
TID: 0.98

## 2014-06-22 MED ORDER — TECHNETIUM TC 99M SESTAMIBI GENERIC - CARDIOLITE
33.0000 | Freq: Once | INTRAVENOUS | Status: AC | PRN
  Administered 2014-06-22: 33 via INTRAVENOUS

## 2014-06-22 MED ORDER — REGADENOSON 0.4 MG/5ML IV SOLN
0.4000 mg | Freq: Once | INTRAVENOUS | Status: AC
  Administered 2014-06-22: 0.4 mg via INTRAVENOUS

## 2014-06-22 MED ORDER — TECHNETIUM TC 99M SESTAMIBI GENERIC - CARDIOLITE
11.0000 | Freq: Once | INTRAVENOUS | Status: AC | PRN
  Administered 2014-06-22: 11 via INTRAVENOUS

## 2014-06-23 ENCOUNTER — Telehealth: Payer: Self-pay | Admitting: *Deleted

## 2014-06-23 ENCOUNTER — Encounter: Payer: Self-pay | Admitting: Physician Assistant

## 2014-06-23 DIAGNOSIS — I2510 Atherosclerotic heart disease of native coronary artery without angina pectoris: Secondary | ICD-10-CM

## 2014-06-23 DIAGNOSIS — R001 Bradycardia, unspecified: Secondary | ICD-10-CM

## 2014-06-23 NOTE — Telephone Encounter (Signed)
Pt notified of myoview results with verbal understanding by phone. Pt agreeable to having echo to assess EF; compare to recent myoview. Pt aware [facility] will call and schedule, may try to get done before he sees Dr. Fletcher Anon 07/04/14.

## 2014-06-27 ENCOUNTER — Ambulatory Visit (HOSPITAL_COMMUNITY): Payer: Medicaid Other | Attending: Cardiovascular Disease

## 2014-06-27 ENCOUNTER — Other Ambulatory Visit: Payer: Self-pay

## 2014-06-27 DIAGNOSIS — R001 Bradycardia, unspecified: Secondary | ICD-10-CM

## 2014-06-27 DIAGNOSIS — I2510 Atherosclerotic heart disease of native coronary artery without angina pectoris: Secondary | ICD-10-CM | POA: Diagnosis not present

## 2014-06-28 ENCOUNTER — Encounter: Payer: Self-pay | Admitting: Physician Assistant

## 2014-07-04 ENCOUNTER — Ambulatory Visit (INDEPENDENT_AMBULATORY_CARE_PROVIDER_SITE_OTHER): Payer: Medicaid Other | Admitting: Cardiovascular Disease

## 2014-07-04 ENCOUNTER — Encounter: Payer: Self-pay | Admitting: Cardiovascular Disease

## 2014-07-04 VITALS — BP 120/88 | HR 47 | Ht 68.0 in | Wt 152.0 lb

## 2014-07-04 DIAGNOSIS — I10 Essential (primary) hypertension: Secondary | ICD-10-CM

## 2014-07-04 MED ORDER — LOSARTAN POTASSIUM 25 MG PO TABS: 25.0000 mg | ORAL_TABLET | Freq: Every day | ORAL | Status: DC

## 2014-07-04 NOTE — Patient Instructions (Signed)
Medication Instructions:  Your physician has recommended you make the following change in your medication:  1. STOP Hydralazine 2. START Losartan '25mg'$  take one by mouth daily  Labwork: Your physician recommends that you return for a FASTING LIPID, LIVER and BMP in 1 WEEK--nothing to eat or drink after midnight, lab opens at 7:30 AM  Testing/Procedures: No new orders.   Follow-Up: Your physician recommends that you schedule a follow-up appointment in: 3 MONTHS with Richardson Dopp PA-C   Any Other Special Instructions Will Be Listed Below (If Applicable).

## 2014-07-06 ENCOUNTER — Telehealth: Payer: Self-pay

## 2014-07-06 ENCOUNTER — Encounter: Payer: Self-pay | Admitting: Cardiovascular Disease
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
## 2014-07-06 NOTE — Telephone Encounter (Signed)
Patient is requesting something else to take in place of losartan because his insurance will not pay for this

## 2014-07-06 NOTE — Telephone Encounter (Signed)
Switched to lisinopril 5 mg once daily.

## 2014-07-10 ENCOUNTER — Other Ambulatory Visit: Payer: Medicaid Other

## 2014-07-10 ENCOUNTER — Other Ambulatory Visit: Payer: Self-pay

## 2014-07-10 ENCOUNTER — Other Ambulatory Visit (INDEPENDENT_AMBULATORY_CARE_PROVIDER_SITE_OTHER): Payer: Medicaid Other | Admitting: *Deleted

## 2014-07-10 DIAGNOSIS — I10 Essential (primary) hypertension: Secondary | ICD-10-CM | POA: Diagnosis not present

## 2014-07-10 DIAGNOSIS — I2510 Atherosclerotic heart disease of native coronary artery without angina pectoris: Secondary | ICD-10-CM | POA: Diagnosis not present

## 2014-07-10 LAB — LIPID PANEL
Cholesterol: 165 mg/dL (ref 0–200)
HDL: 37.1 mg/dL — ABNORMAL LOW (ref >39.00)
LDL Cholesterol: 99 mg/dL (ref 0–99)
NonHDL: 127.9
Total CHOL/HDL Ratio: 4
Triglycerides: 147 mg/dL (ref 0.0–149.0)
VLDL: 29.4 mg/dL (ref 0.0–40.0)

## 2014-07-10 LAB — BASIC METABOLIC PANEL
BUN: 12 mg/dL (ref 6–23)
CO2: 26 mEq/L (ref 19–32)
Calcium: 9.2 mg/dL (ref 8.4–10.5)
Chloride: 106 mEq/L (ref 96–112)
Creatinine, Ser: 1.07 mg/dL (ref 0.40–1.50)
GFR: 90.86 mL/min (ref >60.00)
Glucose, Bld: 94 mg/dL (ref 70–99)
Potassium: 4 mEq/L (ref 3.5–5.1)
Sodium: 137 mEq/L (ref 135–145)

## 2014-07-10 LAB — HEPATIC FUNCTION PANEL
ALT: 22 U/L (ref 0–53)
AST: 27 U/L (ref 0–37)
Albumin: 4.1 g/dL (ref 3.5–5.2)
Alkaline Phosphatase: 117 U/L (ref 39–117)
Bilirubin, Direct: 0.1 mg/dL (ref 0.0–0.3)
Total Bilirubin: 0.5 mg/dL (ref 0.2–1.2)
Total Protein: 7.4 g/dL (ref 6.0–8.3)

## 2014-07-10 MED ORDER — LISINOPRIL 5 MG PO TABS: 5.0000 mg | ORAL_TABLET | Freq: Every day | ORAL | Status: DC

## 2014-07-13 ENCOUNTER — Other Ambulatory Visit: Payer: Self-pay | Admitting: *Deleted

## 2014-07-13 MED ORDER — PRAVASTATIN SODIUM 40 MG PO TABS: 40.0000 mg | ORAL_TABLET | Freq: Every evening | ORAL | Status: DC

## 2014-07-14 ENCOUNTER — Other Ambulatory Visit: Payer: Self-pay

## 2014-07-24 ENCOUNTER — Other Ambulatory Visit: Payer: Medicaid Other

## 2014-08-29 ENCOUNTER — Encounter: Payer: Self-pay | Admitting: Physician Assistant

## 2014-10-03 ENCOUNTER — Ambulatory Visit: Payer: Medicaid Other | Admitting: Physician Assistant
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
## 2014-10-05 ENCOUNTER — Telehealth: Payer: Self-pay | Admitting: *Deleted

## 2014-10-05 ENCOUNTER — Ambulatory Visit (INDEPENDENT_AMBULATORY_CARE_PROVIDER_SITE_OTHER): Payer: Medicaid Other | Admitting: Physician Assistant

## 2014-10-05 ENCOUNTER — Encounter: Payer: Self-pay | Admitting: Physician Assistant

## 2014-10-05 VITALS — BP 106/80 | HR 43 | Ht 68.0 in | Wt 149.4 lb

## 2014-10-05 DIAGNOSIS — I2510 Atherosclerotic heart disease of native coronary artery without angina pectoris: Secondary | ICD-10-CM | POA: Diagnosis not present

## 2014-10-05 DIAGNOSIS — J449 Chronic obstructive pulmonary disease, unspecified: Secondary | ICD-10-CM

## 2014-10-05 DIAGNOSIS — Z720 Tobacco use: Secondary | ICD-10-CM

## 2014-10-05 DIAGNOSIS — R079 Chest pain, unspecified: Secondary | ICD-10-CM | POA: Diagnosis not present

## 2014-10-05 DIAGNOSIS — R001 Bradycardia, unspecified: Secondary | ICD-10-CM

## 2014-10-05 DIAGNOSIS — C3412 Malignant neoplasm of upper lobe, left bronchus or lung: Secondary | ICD-10-CM | POA: Diagnosis not present

## 2014-10-05 DIAGNOSIS — E785 Hyperlipidemia, unspecified: Secondary | ICD-10-CM

## 2014-10-05 LAB — LIPID PANEL
Cholesterol: 168 mg/dL (ref 0–200)
HDL: 38.4 mg/dL — ABNORMAL LOW (ref >39.00)
LDL Cholesterol: 108 mg/dL — ABNORMAL HIGH (ref 0–99)
NonHDL: 129.2
Total CHOL/HDL Ratio: 4
Triglycerides: 104 mg/dL (ref 0.0–149.0)
VLDL: 20.8 mg/dL (ref 0.0–40.0)

## 2014-10-05 LAB — HEPATIC FUNCTION PANEL
ALT: 13 U/L (ref 0–53)
AST: 18 U/L (ref 0–37)
Albumin: 4.3 g/dL (ref 3.5–5.2)
Alkaline Phosphatase: 103 U/L (ref 39–117)
Bilirubin, Direct: 0.1 mg/dL (ref 0.0–0.3)
Total Bilirubin: 0.6 mg/dL (ref 0.2–1.2)
Total Protein: 7.7 g/dL (ref 6.0–8.3)

## 2014-10-05 MED ORDER — LISINOPRIL 2.5 MG PO TABS: 2.5000 mg | ORAL_TABLET | Freq: Every day | ORAL | Status: DC

## 2014-10-05 MED ORDER — ATORVASTATIN CALCIUM 40 MG PO TABS
40.0000 mg | ORAL_TABLET | Freq: Every day | ORAL | Status: DC
Start: 2014-10-05 — End: 2015-06-21

## 2014-10-05 NOTE — Telephone Encounter (Signed)
Pt notified of lab results by phone. He states he has been taking the Pravastatin daily; I advised pt to d/c Pravastatin and start Atorvastatin 40 mg daily, FLP/LFT 12/27/14 Pt agreeable to plan of care.

## 2014-10-05 NOTE — Patient Instructions (Addendum)
Medication Instructions:  1. DECREASE LISINOPRIL TO 2.5 MG DAILY; A NEW RX WAS SENT IN FOR THE 2.5 MG TABLET  Labwork: TODAY LIPID AND LIVER   Testing/Procedures: NONE  Follow-Up: 1. Your physician wants you to follow-up in: [place], [facility] You will receive a reminder letter in the mail two months in advance. If you don't receive a letter, please call our office to schedule the follow-up appointment.  2. YOU ARE BEING REFERRED TO PULMONOLOGY DX COPD; SAW DR. Lamonte Sakai IN THE PAST Any Other Special Instructions Will Be Listed Below (If Applicable).

## 2014-10-16 ENCOUNTER — Other Ambulatory Visit: Payer: Medicaid Other

## 2014-10-23 ENCOUNTER — Institutional Professional Consult (permissible substitution): Payer: Medicaid Other | Admitting: Internal Medicine

## 2014-12-25 ENCOUNTER — Other Ambulatory Visit: Payer: Self-pay | Admitting: Surgery

## 2014-12-25 DIAGNOSIS — C3490 Malignant neoplasm of unspecified part of unspecified bronchus or lung: Secondary | ICD-10-CM

## 2014-12-27 ENCOUNTER — Ambulatory Visit: Payer: Medicaid Other | Admitting: Surgery

## 2014-12-27 ENCOUNTER — Other Ambulatory Visit (INDEPENDENT_AMBULATORY_CARE_PROVIDER_SITE_OTHER): Payer: Medicaid Other

## 2014-12-27 DIAGNOSIS — I2510 Atherosclerotic heart disease of native coronary artery without angina pectoris: Secondary | ICD-10-CM

## 2014-12-27 NOTE — Addendum Note (Signed)
Addended by: Eulis Foster on: 12/27/2014 11:05 AM   Modules accepted: Orders

## 2014-12-27 NOTE — Addendum Note (Signed)
Addended by: Eulis Foster on: 12/27/2014 11:04 AM   Modules accepted: Orders

## 2015-01-03 ENCOUNTER — Ambulatory Visit
Admission: RE | Admit: 2015-01-03 | Discharge: 2015-01-03 | Disposition: A | Discharge status: Home / Self Care | Payer: Medicaid Other | Source: Ambulatory Visit | Attending: Surgery | Admitting: Surgery

## 2015-01-03 ENCOUNTER — Ambulatory Visit (INDEPENDENT_AMBULATORY_CARE_PROVIDER_SITE_OTHER): Payer: Medicaid Other | Admitting: Surgery

## 2015-01-03 ENCOUNTER — Encounter: Payer: Self-pay | Admitting: Surgery

## 2015-01-03 VITALS — BP 96/58 | HR 88 | Resp 20 | Ht 68.0 in | Wt 148.0 lb

## 2015-01-03 DIAGNOSIS — Z902 Acquired absence of lung [part of]: Secondary | ICD-10-CM | POA: Diagnosis not present

## 2015-01-03 DIAGNOSIS — C3490 Malignant neoplasm of unspecified part of unspecified bronchus or lung: Secondary | ICD-10-CM

## 2015-01-03 DIAGNOSIS — C3412 Malignant neoplasm of upper lobe, left bronchus or lung: Secondary | ICD-10-CM | POA: Diagnosis not present

## 2015-01-03 NOTE — Progress Notes (Signed)
HPI:  The patient returns for followup status post left upper lobectomy on 06/12/2011 for a stage IB (T2A, N0, M0) non-small cell lung cancer consistent with adenocarcinoma. This was a 2.0 cm tumor with visceral pleural invasion. He feels well overall. He continues to smoke about 6 cigarettes per day but knows that he needs to quit. He denies any cough or sputum production. He has had no hemoptysis. He has continued to smoke. He denies any headaches or visual changes. 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 He is eating well and his weight is stable.  Current Outpatient Prescriptions  Medication Sig Dispense Refill  . acetaminophen (TYLENOL) 500 MG tablet Take 1,000 mg by mouth every 6 (six) hours as needed for mild pain.    [name] albuterol (PROVENTIL HFA;VENTOLIN HFA) 108 (90 BASE) MCG/ACT inhaler Inhale 2 puffs into the lungs every 6 (six) hours as needed for wheezing or shortness of breath.    [name] aspirin 81 MG tablet Take 81 mg by mouth daily.    [name] atorvastatin (LIPITOR) 40 MG tablet Take 1 tablet (40 mg total) by mouth daily. 30 tablet 11  . gabapentin (NEURONTIN) 300 MG capsule Take 300 mg by mouth daily.    . hydrOXYzine (ATARAX/VISTARIL) 50 MG tablet Take 50 mg by mouth at bedtime as needed (sleep).     . Ibuprofen-Diphenhydramine HCl (ADVIL PM) 200-25 MG CAPS Take 2 tablets by mouth at bedtime as needed (for pain).    [name] lisinopril (PRINIVIL,ZESTRIL) 2.5 MG tablet Take 1 tablet (2.5 mg total) by mouth daily. 30 tablet 11  . omeprazole (PRILOSEC) 20 MG capsule Take 20 mg by mouth daily.     No current facility-administered medications for this visit.     Physical Exam: BP 96/58 mmHg  Pulse 88  Resp 20  Ht '5\' 8"'$  (1.727 m)  Wt 148 lb (67.132 kg)  BMI 22.51 kg/m2  SpO2 95% He looks well  There is no cervical or supraclavicular adenopathy.  Lung exam is clear.  The left thoracotomy scar is  unremarkable.  Abdominal exam shows active bowel sounds. Abdomen is soft, flat and nontender. There are no palpable masses or organomegaly.   Diagnostic Tests:  CLINICAL DATA: History of lung carcinoma with surgery 2 years ago, now with shortness of breath and left upper chest pain  EXAM: CHEST 2 VIEW  COMPARISON: Chest x-ray of 05/12/2014 and CT chest of 06/21/2014  FINDINGS: The lungs remain clear and somewhat hyperaerated with linear scarring at the bases. No active infiltrate or effusion is seen. There is no evidence of metastatic involvement of the lungs. Changes of left upper lobectomy are again noted with surgical clips overlying the left hilum. Mediastinal and hilar contours are unchanged. The heart is within normal limits in size. No bony abnormality is noted.  IMPRESSION: No active lung disease. No evidence of metastatic involvement of the lungs. No change in hyper aeration.   Electronically Signed  By: Ivar Drape M.D.  On: 01/03/2015 10:47   Impression:  He continues to do well following left upper lobectomy for stage IB adenocarcinoma. I stressed the importance of not smoking and gave him information on smoking cessation resources.  Plan:  I will see him back in 6 months with a low dose CT of the chest.   Gaye Pollack, MD [facility] [phone]
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
## 2015-03-19 ENCOUNTER — Encounter: Payer: Self-pay | Admitting: Physician Assistant

## 2015-03-19 ENCOUNTER — Ambulatory Visit (INDEPENDENT_AMBULATORY_CARE_PROVIDER_SITE_OTHER): Payer: Medicaid Other | Admitting: Physician Assistant

## 2015-03-19 VITALS — BP 98/60 | HR 50 | Ht 68.0 in | Wt 145.8 lb

## 2015-03-19 DIAGNOSIS — Z720 Tobacco use: Secondary | ICD-10-CM

## 2015-03-19 DIAGNOSIS — J449 Chronic obstructive pulmonary disease, unspecified: Secondary | ICD-10-CM | POA: Diagnosis not present

## 2015-03-19 DIAGNOSIS — E785 Hyperlipidemia, unspecified: Secondary | ICD-10-CM | POA: Diagnosis not present

## 2015-03-19 DIAGNOSIS — I2510 Atherosclerotic heart disease of native coronary artery without angina pectoris: Secondary | ICD-10-CM | POA: Diagnosis not present

## 2015-03-19 DIAGNOSIS — C3412 Malignant neoplasm of upper lobe, left bronchus or lung: Secondary | ICD-10-CM

## 2015-03-19 DIAGNOSIS — R001 Bradycardia, unspecified: Secondary | ICD-10-CM

## 2015-03-19 LAB — HEPATIC FUNCTION PANEL
ALT: 9 U/L (ref 9–46)
AST: 20 U/L (ref 10–35)
Albumin: 3.9 g/dL (ref 3.6–5.1)
Alkaline Phosphatase: 99 U/L (ref 40–115)
Bilirubin, Direct: 0.1 mg/dL (ref <=0.2)
Indirect Bilirubin: 0.3 mg/dL (ref 0.2–1.2)
Total Bilirubin: 0.4 mg/dL (ref 0.2–1.2)
Total Protein: 7.6 g/dL (ref 6.1–8.1)

## 2015-03-19 LAB — LIPID PANEL
Cholesterol: 168 mg/dL (ref 125–200)
HDL: 45 mg/dL (ref >=40)
LDL Cholesterol: 106 mg/dL (ref <130)
Total CHOL/HDL Ratio: 3.7 Ratio (ref <=5.0)
Triglycerides: 86 mg/dL (ref <150)
VLDL: 17 mg/dL (ref <30)

## 2015-03-19 NOTE — Patient Instructions (Addendum)
Medication Instructions:  STOP LISINOPRIL  Labwork: FASTING LIPID AND LIVER PANEL TODAY  Testing/Procedures: NONE  Follow-Up: 1. Your physician wants you to follow-up in: [place] will receive a reminder letter in the mail two months in advance. If you don't receive a letter, please call our office to schedule the follow-up appointment.  2. YOU ARE BEING REFERRED TO [facility]; DR. Lamonte Sakai DX COPD Any Other Special Instructions Will Be Listed Below (If Applicable). YOU CAN TRY OTC MUCINEX DM FOR COUGH (MAKE SURE IT IS THE DM AND NOT D)  If you need a refill on your cardiac medications before your next appointment, please call your pharmacy.

## 2015-03-21 ENCOUNTER — Telehealth: Payer: Self-pay | Admitting: *Deleted

## 2015-03-21 NOTE — Telephone Encounter (Signed)
Lmptcb to go over lab results 

## 2015-03-21 NOTE — Telephone Encounter (Signed)
Pt has been notified of lab results by phone with verbal understanding. 

## 2015-04-02 ENCOUNTER — Ambulatory Visit: Payer: Medicaid Other | Admitting: Physician Assistant

## 2015-04-16 ENCOUNTER — Ambulatory Visit (INDEPENDENT_AMBULATORY_CARE_PROVIDER_SITE_OTHER): Payer: Medicaid Other | Admitting: Emergency Medicine

## 2015-04-16 ENCOUNTER — Ambulatory Visit (HOSPITAL_COMMUNITY)
Admission: RE | Admit: 2015-04-16 | Discharge: 2015-04-16 | Disposition: A | Discharge status: Home / Self Care | Payer: Medicaid Other | Source: Ambulatory Visit | Attending: Emergency Medicine | Admitting: Emergency Medicine

## 2015-04-16 ENCOUNTER — Encounter: Payer: Self-pay | Admitting: Emergency Medicine

## 2015-04-16 ENCOUNTER — Other Ambulatory Visit (INDEPENDENT_AMBULATORY_CARE_PROVIDER_SITE_OTHER): Payer: Medicaid Other

## 2015-04-16 ENCOUNTER — Encounter (HOSPITAL_COMMUNITY): Payer: Self-pay

## 2015-04-16 VITALS — BP 116/88 | HR 50 | Ht 69.0 in | Wt 147.0 lb

## 2015-04-16 DIAGNOSIS — J439 Emphysema, unspecified: Secondary | ICD-10-CM | POA: Insufficient documentation

## 2015-04-16 DIAGNOSIS — J449 Chronic obstructive pulmonary disease, unspecified: Secondary | ICD-10-CM | POA: Diagnosis not present

## 2015-04-16 DIAGNOSIS — J984 Other disorders of lung: Secondary | ICD-10-CM | POA: Diagnosis not present

## 2015-04-16 DIAGNOSIS — R079 Chest pain, unspecified: Secondary | ICD-10-CM | POA: Insufficient documentation

## 2015-04-16 DIAGNOSIS — R911 Solitary pulmonary nodule: Secondary | ICD-10-CM | POA: Diagnosis not present

## 2015-04-16 DIAGNOSIS — R0789 Other chest pain: Secondary | ICD-10-CM | POA: Insufficient documentation

## 2015-04-16 DIAGNOSIS — C3412 Malignant neoplasm of upper lobe, left bronchus or lung: Secondary | ICD-10-CM

## 2015-04-16 LAB — BASIC METABOLIC PANEL
BUN: 11 mg/dL (ref 6–23)
CO2: 26 mEq/L (ref 19–32)
Calcium: 9.5 mg/dL (ref 8.4–10.5)
Chloride: 106 mEq/L (ref 96–112)
Creatinine, Ser: 1.16 mg/dL (ref 0.40–1.50)
GFR: 82.56 mL/min (ref >60.00)
Glucose, Bld: 66 mg/dL — ABNORMAL LOW (ref 70–99)
Potassium: 4.2 mEq/L (ref 3.5–5.1)
Sodium: 139 mEq/L (ref 135–145)

## 2015-04-16 MED ORDER — IOHEXOL 350 MG/ML SOLN
100.0000 mL | Freq: Once | INTRAVENOUS | Status: AC | PRN
  Administered 2015-04-16: 100 mL via INTRAVENOUS

## 2015-04-16 MED ORDER — TIOTROPIUM BROMIDE MONOHYDRATE 18 MCG IN CAPS: 18.0000 ug | ORAL_CAPSULE | Freq: Every day | RESPIRATORY_TRACT | Status: DC

## 2015-04-16 NOTE — Assessment & Plan Note (Signed)
Patient having chest discomfort and pressure. Suspect that this relates to post surgical pain but certainly have to rule out pulmonary embolism or any evidence for recurrence. I will perform a CT-PA now, call him with the results.

## 2015-04-16 NOTE — Assessment & Plan Note (Signed)
Suspect that he's had some progression of his COPD given his continued smoking. We talked about cessation today and made a plan for him to cut down to 15 cigarettes daily. Also I would like to start him empirically on Spiriva to see if he benefits.

## 2015-04-16 NOTE — Addendum Note (Signed)
Addended by: Desmond Dike C on: 04/16/2015 03:45 PM   Modules accepted: Orders

## 2015-04-16 NOTE — Addendum Note (Signed)
Addended by: Desmond Dike C on: 04/16/2015 03:33 PM   Modules accepted: Orders

## 2015-04-16 NOTE — Patient Instructions (Signed)
We will perform a CT scan of your chest now Please start Spiriva, one inhalation daily Keep albuterol available to use 2 puffs as needed for shortness of breath Decrease your Cigarette smoking down to 15 cigarettes a day. Do not smoke more than this number. Follow with Dr Lamonte Sakai next available with full pulmonary function testing on the same day.

## 2015-04-16 NOTE — Addendum Note (Signed)
Addended by: Desmond Dike C on: 04/16/2015 03:52 PM   Modules accepted: Orders

## 2015-04-16 NOTE — Assessment & Plan Note (Signed)
Treated with primary resection and a left upper lobe lobectomy.
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
## 2015-04-17 LAB — D-DIMER, QUANTITATIVE: D-Dimer, Quant: 0.37 ug/mL-FEU (ref 0.00–0.48)

## 2015-05-24 ENCOUNTER — Other Ambulatory Visit: Payer: Self-pay | Admitting: Surgery

## 2015-05-24 DIAGNOSIS — C3490 Malignant neoplasm of unspecified part of unspecified bronchus or lung: Secondary | ICD-10-CM

## 2015-06-20 ENCOUNTER — Other Ambulatory Visit: Payer: Self-pay | Admitting: Emergency Medicine

## 2015-06-20 DIAGNOSIS — R0600 Dyspnea, unspecified: Secondary | ICD-10-CM

## 2015-06-21 ENCOUNTER — Ambulatory Visit (INDEPENDENT_AMBULATORY_CARE_PROVIDER_SITE_OTHER): Payer: Medicaid Other | Admitting: Emergency Medicine

## 2015-06-21 ENCOUNTER — Encounter: Payer: Self-pay | Admitting: Emergency Medicine

## 2015-06-21 VITALS — BP 106/78 | HR 54 | Ht 69.0 in | Wt 145.0 lb

## 2015-06-21 DIAGNOSIS — R9389 Abnormal findings on diagnostic imaging of other specified body structures: Secondary | ICD-10-CM

## 2015-06-21 DIAGNOSIS — R0600 Dyspnea, unspecified: Secondary | ICD-10-CM

## 2015-06-21 DIAGNOSIS — R938 Abnormal findings on diagnostic imaging of other specified body structures: Secondary | ICD-10-CM

## 2015-06-21 DIAGNOSIS — J449 Chronic obstructive pulmonary disease, unspecified: Secondary | ICD-10-CM

## 2015-06-21 DIAGNOSIS — C3412 Malignant neoplasm of upper lobe, left bronchus or lung: Secondary | ICD-10-CM

## 2015-06-21 LAB — PULMONARY FUNCTION TEST
DL/VA % pred: 31 %
DL/VA: 1.43 ml/min/mmHg/L
DLCO cor % pred: 26 %
DLCO cor: 8.19 ml/min/mmHg
DLCO unc % pred: 27 %
DLCO unc: 8.49 ml/min/mmHg
FEF 25-75 Post: 0.81 L/sec
FEF 25-75 Pre: 0.7 L/sec
FEF2575-%Change-Post: 14 %
FEF2575-%Pred-Post: 28 %
FEF2575-%Pred-Pre: 25 %
FEV1-%Change-Post: 4 %
FEV1-%Pred-Post: 61 %
FEV1-%Pred-Pre: 58 %
FEV1-Post: 1.83 L
FEV1-Pre: 1.76 L
FEV1FVC-%Change-Post: 6 %
FEV1FVC-%Pred-Pre: 56 %
FEV6-%Change-Post: 0 %
FEV6-%Pred-Post: 103 %
FEV6-%Pred-Pre: 104 %
FEV6-Post: 3.85 L
FEV6-Pre: 3.89 L
FEV6FVC-%Change-Post: 1 %
FEV6FVC-%Pred-Post: 102 %
FEV6FVC-%Pred-Pre: 100 %
FVC-%Change-Post: -2 %
FVC-%Pred-Post: 101 %
FVC-%Pred-Pre: 103 %
FVC-Post: 3.92 L
FVC-Pre: 4.01 L
Post FEV1/FVC ratio: 47 %
Post FEV6/FVC ratio: 98 %
Pre FEV1/FVC ratio: 44 %
Pre FEV6/FVC Ratio: 97 %
RV % pred: 124 %
RV: 2.72 L
TLC % pred: 101 %
TLC: 6.9 L

## 2015-06-21 NOTE — Assessment & Plan Note (Signed)
New 7 mm right upper lobe nodule will need to be tracked on his postoperative CT scans for interval change.

## 2015-06-21 NOTE — Progress Notes (Signed)
PFT done today. 

## 2015-06-21 NOTE — Assessment & Plan Note (Signed)
Following with Dr. Cyndia Bent with serial CT scans planned.

## 2015-06-21 NOTE — Assessment & Plan Note (Signed)
Severe obstruction based on a pulmonary function testing from today. No bronchodilator response. This is a significant decline in function compared with 2013.    We will continue Spiriva once a day  Continue ProAir as needed for shortness of breath Follow with Dr Lamonte Sakai in 6 months or sooner if you have any problems
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
## 2015-06-21 NOTE — Patient Instructions (Addendum)
We will continue Spiriva once a day  Continue ProAir as needed for shortness of breath Your CT scan of the chest from March shows a small right pulmonary nodule that will need to be followed to make sure it is not changing. Get your CT scan of the chest as ordered by Dr Cyndia Bent.  Follow with Dr Lamonte Sakai in 6 months or sooner if you have any problems

## 2015-06-22 ENCOUNTER — Other Ambulatory Visit: Payer: Self-pay | Admitting: Surgery

## 2015-06-22 DIAGNOSIS — C3490 Malignant neoplasm of unspecified part of unspecified bronchus or lung: Secondary | ICD-10-CM

## 2015-06-27 ENCOUNTER — Ambulatory Visit: Payer: Medicaid Other

## 2015-06-27 ENCOUNTER — Inpatient Hospital Stay: Admission: RE | Admit: 2015-06-27 | Payer: Medicaid Other | Source: Ambulatory Visit

## 2015-06-27 ENCOUNTER — Ambulatory Visit (INDEPENDENT_AMBULATORY_CARE_PROVIDER_SITE_OTHER): Payer: Medicaid Other | Admitting: Surgery

## 2015-06-27 ENCOUNTER — Other Ambulatory Visit: Payer: Self-pay | Admitting: *Deleted

## 2015-06-27 ENCOUNTER — Encounter: Payer: Self-pay | Admitting: Surgery

## 2015-06-27 VITALS — BP 99/78 | HR 66 | Resp 16 | Ht 69.0 in | Wt 145.0 lb

## 2015-06-27 DIAGNOSIS — Z902 Acquired absence of lung [part of]: Secondary | ICD-10-CM

## 2015-06-27 DIAGNOSIS — C3412 Malignant neoplasm of upper lobe, left bronchus or lung: Secondary | ICD-10-CM

## 2015-06-27 DIAGNOSIS — R911 Solitary pulmonary nodule: Secondary | ICD-10-CM | POA: Diagnosis not present

## 2015-06-27 DIAGNOSIS — R918 Other nonspecific abnormal finding of lung field: Secondary | ICD-10-CM | POA: Insufficient documentation

## 2015-06-27 NOTE — Progress Notes (Signed)
HPI:  The patient is a 60 year old gentleman who is status post left upper lobectomy on 06/12/2011 for a stage IB (T2A, N0, M0) non-small cell lung cancer consistent with adenocarcinoma. This was a 2.0 cm tumor with visceral pleural invasion. He feels well overall. He continues to smoke about a pack a day. He denies any sputum production but has a chronic cough. He has had no hemoptysis. He saw Dr. Lamonte Sakai in March for follow up of shortness of breath. A CT of the chest showed no PE but did show a new 7 mm spiculated nodule in the RUL.  He had PFT's on 06/21/2015 which showed a significant deterioration in lung function since his preop PFT's in 05/2011. He denies any headaches or visual changes. 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 He is eating well and his weight is stable.   Current Outpatient Prescriptions  Medication Sig Dispense Refill  . acetaminophen (TYLENOL) 500 MG tablet Take 1,000 mg by mouth every 6 (six) hours as needed for mild pain.    [name] albuterol (PROVENTIL HFA;VENTOLIN HFA) 108 (90 BASE) MCG/ACT inhaler Inhale 2 puffs into the lungs every 6 (six) hours as needed for wheezing or shortness of breath.    [name] aspirin 81 MG tablet Take 81 mg by mouth daily.    [name] gabapentin (NEURONTIN) 300 MG capsule Take 300 mg by mouth daily.    . hydrOXYzine (ATARAX/VISTARIL) 50 MG tablet Take 50 mg by mouth at bedtime as needed (sleep).     . Ibuprofen-Diphenhydramine HCl (ADVIL PM) 200-25 MG CAPS Take 2 tablets by mouth at bedtime as needed (for pain).    [name] omeprazole (PRILOSEC) 20 MG capsule Take 20 mg by mouth daily.    [name] tiotropium (SPIRIVA) 18 MCG inhalation capsule Place 1 capsule (18 mcg total) into inhaler and inhale daily. 30 capsule 5   No current facility-administered medications for this visit.     Physical Exam: BP 99/78 mmHg  Pulse 66  Resp 16  Ht '5\' 9"'$  (1.753 m)  Wt 145 lb (65.772 kg)   BMI 21.40 kg/m2  SpO2 95% He looks well  There is no cervical or supraclavicular adenopathy.  Lung exam is clear.  The left thoracotomy scar is unremarkable.  Abdominal exam shows active bowel sounds. Abdomen is soft, flat and nontender. There are no palpable masses or organomegaly.   Diagnostic Tests:  CLINICAL DATA: Central chest pain. No time course given. History of emphysema.  EXAM: CT ANGIOGRAPHY CHEST WITH CONTRAST  TECHNIQUE: Multidetector CT imaging of the chest was performed using the standard protocol during bolus administration of intravenous contrast. Multiplanar CT image reconstructions and MIPs were obtained to evaluate the vascular anatomy.  CONTRAST: 17m OMNIPAQUE IOHEXOL 350 MG/ML SOLN  COMPARISON: Chest CT 06/21/2014.  FINDINGS: Mediastinum/Nodes: No chest wall mass, supraclavicular or axillary lymphadenopathy. Small scattered lymph nodes are noted.  The heart is normal in size. No pericardial effusion. Mild diffuse muscular wall thickening of the left ventricle. The aorta is normal in caliber. Mild tortuosity. No dissection.  The pulmonary arterial tree is fairly well opacified. No filling defects to suggest pulmonary embolism. There is a web or band in the proximal left pulmonary artery. This is near surgical clips and may be related to prior surgery. A prior pulmonary embolism with residual scarring is also possible but no other abnormalities are seen.  No mediastinal  or hilar mass or adenopathy. The esophagus is grossly normal.  Lungs/Pleura: Advanced emphysematous changes and pulmonary scarring. No acute overlying pulmonary process.  There is a new 7 mm right upper lobe pulmonary nodule on image number 21. This is worrisome for a primary lung neoplasm. It may be too small for PET-CT. Recommend short-term follow-up chest CT in 3-4 months to reassess.  No other worrisome pulmonary lesions are identified. No  pleural effusion. No interstitial lung disease.  Upper abdomen: No significant upper abdominal findings. Upper pole left renal cyst is noted.  Musculoskeletal: No significant bony findings.  Review of the MIP images confirms the above findings.  IMPRESSION: 1. No CT findings for pulmonary embolism. 2. No mediastinal or hilar mass or adenopathy. 3. New 7 mm right upper lobe pulmonary nodule. This is probably just below the limits of PET-CT. Recommend follow-up short-term chest CT in 3-4 months. 4. Severe emphysematous changes and pulmonary scarring.   Electronically Signed  By: Marijo Sanes M.D.  On: 04/16/2015 17:26   Impression:  He had a new 7 mm spiculated nodule in the RUL and has continued to smoke since his prior lung cancer surgery in 06/2011. I think this is suspicious for a new primary lung cancer. I think it would be best to repeat his CT in about a month to follow up on this lesion and decide if further intervention for diagnosis is needed. I think it is too small for a biopsy or PET scan at this time. If this is a lung cancer I dont't think that he will be a candidate for surgical resection due to severe COPD.  Plan:  I will see him back in one month with a CT of the chest.   Gaye Pollack, MD [facility] [phone]

## 2015-07-25 ENCOUNTER — Ambulatory Visit
Admission: RE | Admit: 2015-07-25 | Discharge: 2015-07-25 | Disposition: A | Discharge status: Home / Self Care | Payer: Medicaid Other | Source: Ambulatory Visit | Attending: Surgery | Admitting: Surgery

## 2015-07-25 ENCOUNTER — Ambulatory Visit (INDEPENDENT_AMBULATORY_CARE_PROVIDER_SITE_OTHER): Payer: Medicaid Other | Admitting: Surgery

## 2015-07-25 ENCOUNTER — Encounter: Payer: Self-pay | Admitting: Surgery

## 2015-07-25 VITALS — BP 123/77 | HR 54 | Resp 20 | Ht 69.0 in | Wt 145.0 lb

## 2015-07-25 DIAGNOSIS — R911 Solitary pulmonary nodule: Secondary | ICD-10-CM

## 2015-07-25 DIAGNOSIS — Z902 Acquired absence of lung [part of]: Secondary | ICD-10-CM | POA: Diagnosis not present

## 2015-07-25 DIAGNOSIS — C3412 Malignant neoplasm of upper lobe, left bronchus or lung: Secondary | ICD-10-CM

## 2015-07-26 ENCOUNTER — Encounter: Payer: Self-pay | Admitting: Surgery

## 2015-07-26 NOTE — Progress Notes (Signed)
HPI:  The patient is a 60 year old gentleman who is status post left upper lobectomy on 06/12/2011 for a stage IB (T2A, N0, M0) non-small cell lung cancer consistent with adenocarcinoma. This was a 2.0 cm tumor with visceral pleural invasion. He feels well overall. He says that he stopped smoking last week and is using a nicotine patch and E-cigarette.  He denies any sputum production but has a chronic cough. He has had no hemoptysis. He saw Dr. Lamonte Sakai in March for follow up of shortness of breath. A CT of the chest showed no PE but did show a new 7 mm spiculated nodule in the RUL. He had PFT's on 06/21/2015 which showed a significant deterioration in lung function since his preop PFT's in 05/2011 with a diffusion capacity of only 26% predicted down from 31% and an FEV1 of 1.76, down from 2.5. He denies any headaches or visual changes. 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 He is eating well and his weight is stable.   Current Outpatient Prescriptions  Medication Sig Dispense Refill  . acetaminophen (TYLENOL) 500 MG tablet Take 1,000 mg by mouth every 6 (six) hours as needed for mild pain.    [name] albuterol (PROVENTIL HFA;VENTOLIN HFA) 108 (90 BASE) MCG/ACT inhaler Inhale 2 puffs into the lungs every 6 (six) hours as needed for wheezing or shortness of breath.    [name] aspirin 81 MG tablet Take 81 mg by mouth daily.    [name] gabapentin (NEURONTIN) 300 MG capsule Take 300 mg by mouth daily.    . hydrOXYzine (ATARAX/VISTARIL) 50 MG tablet Take 50 mg by mouth at bedtime as needed (sleep).     . Ibuprofen-Diphenhydramine HCl (ADVIL PM) 200-25 MG CAPS Take 2 tablets by mouth at bedtime as needed (for pain).    [name] omeprazole (PRILOSEC) 20 MG capsule Take 20 mg by mouth daily.    [name] tiotropium (SPIRIVA) 18 MCG inhalation capsule Place 1 capsule (18 mcg total) into inhaler and inhale daily. 30 capsule 5   No current  facility-administered medications for this visit.     Physical Exam: BP 123/77 mmHg  Pulse 54  Resp 20  Ht '5\' 9"'$  (1.753 m)  Wt 145 lb (65.772 kg)  BMI 21.40 kg/m2  SpO2 96% He looks well  There is no cervical or supraclavicular adenopathy.  Lung exam is clear.  The left thoracotomy scar is unremarkable.  Abdominal exam shows active bowel sounds. Abdomen is soft, flat and nontender. There are no palpable masses or organomegaly.   Diagnostic Tests:  CLINICAL DATA: Followup of right upper lobe lung nodule, some shortness of breath and cough, history of left upper lobe resection for carcinoma 2013  EXAM: CT CHEST WITHOUT CONTRAST  TECHNIQUE: Multidetector CT imaging of the chest was performed following the standard protocol without IV contrast.  COMPARISON: CT chest of 04/16/2015 and 06/21/2014  FINDINGS: Diffuse moderately severe changes of centrilobular emphysema are noted. The nodule within the medial right upper lobe noted on the prior CT has not changed still measuring 6.4 mm. However, compared to the CT from 06/21/2014, this nodule is new compared to that study and therefore is worrisome for primary or metastatic disease. No new lung nodule is seen. No parenchymal infiltrate is noted and there is no evidence of pleural effusion. The central airway is patent. Changes of prior left upper lobectomy are noted.  On soft  tissue window images, the thyroid gland is unremarkable. On this unenhanced study, there are some prominent nodes present in the pretracheal region on image 32 there is a node with short axis diameter of 7 mm. A precarinal node on image 49 measures 8 mm in short axis diameter. No definite mediastinal or hilar adenopathy is seen. Considerable calcification is noted in the distribution of the left main, left anterior descending, circumflex, and right coronary arteries. No pericardial effusion is seen. No abnormality of the upper abdomen is seen  on this unenhanced study. The thoracic vertebrae are in normal alignment with no acute abnormality.  IMPRESSION: 1. Stable 6.4 mm noncalcified nodule in the right upper lobe near the apex. This nodule is new however compared to the CT from May of 2016 and continued followup therefore is recommended. 2. Severe centrilobular emphysema diffusely. 3. Stable slightly prominent mediastinal lymph nodes.   Electronically Signed  By: Ivar Drape M.D.  On: 07/25/2015 14:19   Impression:  The 7 mm RUL spiculated nodule is unchanged from March. It remains suspicious for a new lung cancer since it was not present on his prior study in 06/2014. It is still too small to do a reliable biopsy and too small for PET scan. He could potentially be a candidate for a wedge resection since it is in the apex and somewhat peripheral but I would like to see it get a little larger before I remove it since he lung function is poor. I do not want to put him through surgery unless it is necessary. SBRT would always be an option but I think surgical resection is probably best for him.  Plan:  I will see him in 4 months with a repeat chest CT.   Gaye Pollack, MD [facility] [phone]

## 2015-08-12 ENCOUNTER — Encounter (HOSPITAL_COMMUNITY): Payer: Self-pay

## 2015-08-12 ENCOUNTER — Emergency Department (HOSPITAL_COMMUNITY): Payer: Medicaid Other

## 2015-08-12 ENCOUNTER — Emergency Department (HOSPITAL_COMMUNITY)
Admission: EM | Admit: 2015-08-12 | Discharge: 2015-08-12 | Disposition: A | Discharge status: Home / Self Care | Payer: Medicaid Other | Attending: Emergency Medicine | Admitting: Emergency Medicine

## 2015-08-12 DIAGNOSIS — R079 Chest pain, unspecified: Secondary | ICD-10-CM | POA: Diagnosis present

## 2015-08-12 DIAGNOSIS — Z7982 Long term (current) use of aspirin: Secondary | ICD-10-CM | POA: Insufficient documentation

## 2015-08-12 DIAGNOSIS — F17210 Nicotine dependence, cigarettes, uncomplicated: Secondary | ICD-10-CM | POA: Diagnosis not present

## 2015-08-12 DIAGNOSIS — Z79899 Other long term (current) drug therapy: Secondary | ICD-10-CM | POA: Diagnosis not present

## 2015-08-12 DIAGNOSIS — Z85118 Personal history of other malignant neoplasm of bronchus and lung: Secondary | ICD-10-CM | POA: Diagnosis not present

## 2015-08-12 DIAGNOSIS — J189 Pneumonia, unspecified organism: Secondary | ICD-10-CM | POA: Diagnosis not present

## 2015-08-12 DIAGNOSIS — I2510 Atherosclerotic heart disease of native coronary artery without angina pectoris: Secondary | ICD-10-CM | POA: Diagnosis not present

## 2015-08-12 LAB — BASIC METABOLIC PANEL
Anion gap: 8 (ref 5–15)
BUN: 11 mg/dL (ref 6–20)
CO2: 22 mmol/L (ref 22–32)
Calcium: 9.1 mg/dL (ref 8.9–10.3)
Chloride: 107 mmol/L (ref 101–111)
Creatinine, Ser: 1.19 mg/dL (ref 0.61–1.24)
GFR calc Af Amer: >60 mL/min (ref >60)
GFR calc non Af Amer: >60 mL/min (ref >60)
Glucose, Bld: 55 mg/dL — ABNORMAL LOW (ref 65–99)
Potassium: 5.5 mmol/L — ABNORMAL HIGH (ref 3.5–5.1)
Sodium: 137 mmol/L (ref 135–145)

## 2015-08-12 LAB — I-STAT TROPONIN, ED
Troponin i, poc: 0 ng/mL (ref 0.00–0.08)
Troponin i, poc: 0 ng/mL (ref 0.00–0.08)

## 2015-08-12 LAB — CBC
HCT: 43.7 % (ref 39.0–52.0)
Hemoglobin: 14.4 g/dL (ref 13.0–17.0)
MCH: 30.6 pg (ref 26.0–34.0)
MCHC: 33 g/dL (ref 30.0–36.0)
MCV: 92.8 fL (ref 78.0–100.0)
Platelets: 250 10*3/uL (ref 150–400)
RBC: 4.71 MIL/uL (ref 4.22–5.81)
RDW: 14.6 % (ref 11.5–15.5)
WBC: 6.9 10*3/uL (ref 4.0–10.5)

## 2015-08-12 LAB — I-STAT CHEM 8, ED
BUN: 12 mg/dL (ref 6–20)
Calcium, Ion: 1.21 mmol/L (ref 1.12–1.23)
Chloride: 104 mmol/L (ref 101–111)
Creatinine, Ser: 1.2 mg/dL (ref 0.61–1.24)
Glucose, Bld: 76 mg/dL (ref 65–99)
HCT: 45 % (ref 39.0–52.0)
Hemoglobin: 15.3 g/dL (ref 13.0–17.0)
Potassium: 4.3 mmol/L (ref 3.5–5.1)
Sodium: 141 mmol/L (ref 135–145)
TCO2: 27 mmol/L (ref 0–100)

## 2015-08-12 MED ORDER — ASPIRIN 81 MG PO CHEW
324.0000 mg | CHEWABLE_TABLET | Freq: Once | ORAL | Status: AC
  Administered 2015-08-12: 324 mg via ORAL
  Filled 2015-08-12: qty 4

## 2015-08-12 MED ORDER — IOPAMIDOL (ISOVUE-370) INJECTION 76%
INTRAVENOUS | Status: AC
  Administered 2015-08-12: 80 mL
  Filled 2015-08-12: qty 100

## 2015-08-12 MED ORDER — HYDROCODONE-ACETAMINOPHEN 5-325 MG PO TABS: 1.0000 | ORAL_TABLET | ORAL | Status: DC | PRN

## 2015-08-12 MED ORDER — OXYCODONE-ACETAMINOPHEN 5-325 MG PO TABS
1.0000 | ORAL_TABLET | Freq: Once | ORAL | Status: AC
  Administered 2015-08-12: 1 via ORAL
  Filled 2015-08-12: qty 1

## 2015-08-12 MED ORDER — HYDROCODONE-ACETAMINOPHEN 5-325 MG PO TABS
1.0000 | ORAL_TABLET | Freq: Once | ORAL | Status: AC
  Administered 2015-08-12: 1 via ORAL
  Filled 2015-08-12: qty 1

## 2015-08-12 MED ORDER — LEVOFLOXACIN 750 MG PO TABS
750.0000 mg | ORAL_TABLET | Freq: Once | ORAL | Status: AC
  Administered 2015-08-12: 750 mg via ORAL
  Filled 2015-08-12: qty 1

## 2015-08-12 MED ORDER — LEVOFLOXACIN 750 MG PO TABS: 750.0000 mg | ORAL_TABLET | Freq: Every day | ORAL | Status: DC
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
## 2015-08-12 NOTE — ED Notes (Signed)
Pt. Presents with complaint of L sided CP starting approx 3 days ago. Pt. States hx of COPD, states worsening SOB over the past 3 days as well. Pt. Ambulatory, AxO x4.

## 2015-08-12 NOTE — ED Notes (Signed)
MD at bedside. 

## 2015-09-12 ENCOUNTER — Other Ambulatory Visit: Payer: Self-pay | Admitting: Emergency Medicine

## 2015-09-12 ENCOUNTER — Telehealth: Payer: Self-pay | Admitting: Emergency Medicine

## 2015-09-12 MED ORDER — TIOTROPIUM BROMIDE MONOHYDRATE 18 MCG IN CAPS: ORAL_CAPSULE | RESPIRATORY_TRACT | 5 refills | Status: DC

## 2015-09-12 NOTE — Telephone Encounter (Signed)
Called and spoke with pt and he is aware of refill that has been sent to the pharamcy. Nothing further is needed.

## 2015-10-06 ENCOUNTER — Emergency Department (HOSPITAL_COMMUNITY): Payer: Medicaid Other

## 2015-10-06 ENCOUNTER — Encounter (HOSPITAL_COMMUNITY): Payer: Self-pay

## 2015-10-06 ENCOUNTER — Emergency Department (HOSPITAL_COMMUNITY)
Admission: EM | Admit: 2015-10-06 | Discharge: 2015-10-06 | Disposition: A | Discharge status: Home / Self Care | Payer: Medicaid Other | Attending: Emergency Medicine | Admitting: Emergency Medicine

## 2015-10-06 DIAGNOSIS — F17210 Nicotine dependence, cigarettes, uncomplicated: Secondary | ICD-10-CM | POA: Insufficient documentation

## 2015-10-06 DIAGNOSIS — I2510 Atherosclerotic heart disease of native coronary artery without angina pectoris: Secondary | ICD-10-CM | POA: Diagnosis not present

## 2015-10-06 DIAGNOSIS — M6281 Muscle weakness (generalized): Secondary | ICD-10-CM | POA: Insufficient documentation

## 2015-10-06 DIAGNOSIS — J449 Chronic obstructive pulmonary disease, unspecified: Secondary | ICD-10-CM | POA: Insufficient documentation

## 2015-10-06 DIAGNOSIS — M5030 Other cervical disc degeneration, unspecified cervical region: Secondary | ICD-10-CM | POA: Diagnosis not present

## 2015-10-06 DIAGNOSIS — M5412 Radiculopathy, cervical region: Secondary | ICD-10-CM | POA: Insufficient documentation

## 2015-10-06 DIAGNOSIS — Z79899 Other long term (current) drug therapy: Secondary | ICD-10-CM | POA: Diagnosis not present

## 2015-10-06 DIAGNOSIS — Z7982 Long term (current) use of aspirin: Secondary | ICD-10-CM | POA: Diagnosis not present

## 2015-10-06 DIAGNOSIS — M5431 Sciatica, right side: Secondary | ICD-10-CM | POA: Diagnosis not present

## 2015-10-06 DIAGNOSIS — M79601 Pain in right arm: Secondary | ICD-10-CM | POA: Diagnosis present

## 2015-10-06 DIAGNOSIS — M5432 Sciatica, left side: Secondary | ICD-10-CM | POA: Insufficient documentation

## 2015-10-06 DIAGNOSIS — Z85118 Personal history of other malignant neoplasm of bronchus and lung: Secondary | ICD-10-CM | POA: Diagnosis not present

## 2015-10-06 DIAGNOSIS — R29898 Other symptoms and signs involving the musculoskeletal system: Secondary | ICD-10-CM

## 2015-10-06 LAB — BASIC METABOLIC PANEL
Anion gap: 8 (ref 5–15)
BUN: 10 mg/dL (ref 6–20)
CO2: 20 mmol/L — ABNORMAL LOW (ref 22–32)
Calcium: 9.1 mg/dL (ref 8.9–10.3)
Chloride: 107 mmol/L (ref 101–111)
Creatinine, Ser: 1.17 mg/dL (ref 0.61–1.24)
GFR calc Af Amer: >60 mL/min (ref >60)
GFR calc non Af Amer: >60 mL/min (ref >60)
Glucose, Bld: 92 mg/dL (ref 65–99)
Potassium: 3.9 mmol/L (ref 3.5–5.1)
Sodium: 135 mmol/L (ref 135–145)

## 2015-10-06 LAB — CBC WITH DIFFERENTIAL/PLATELET
Basophils Absolute: 0 10*3/uL (ref 0.0–0.1)
Basophils Relative: 1 %
Eosinophils Absolute: 0.1 10*3/uL (ref 0.0–0.7)
Eosinophils Relative: 1 %
HCT: 42.8 % (ref 39.0–52.0)
Hemoglobin: 14 g/dL (ref 13.0–17.0)
Lymphocytes Relative: 19 %
Lymphs Abs: 0.9 10*3/uL (ref 0.7–4.0)
MCH: 30.5 pg (ref 26.0–34.0)
MCHC: 32.7 g/dL (ref 30.0–36.0)
MCV: 93.2 fL (ref 78.0–100.0)
Monocytes Absolute: 0.2 10*3/uL (ref 0.1–1.0)
Monocytes Relative: 6 %
Neutro Abs: 3.2 10*3/uL (ref 1.7–7.7)
Neutrophils Relative %: 73 %
Platelets: 265 10*3/uL (ref 150–400)
RBC: 4.59 MIL/uL (ref 4.22–5.81)
RDW: 14.7 % (ref 11.5–15.5)
WBC: 4.4 10*3/uL (ref 4.0–10.5)

## 2015-10-06 LAB — URINALYSIS, ROUTINE W REFLEX MICROSCOPIC
Bilirubin Urine: NEGATIVE
Glucose, UA: NEGATIVE mg/dL
Hgb urine dipstick: NEGATIVE
Ketones, ur: 15 mg/dL — AB
Leukocytes, UA: NEGATIVE
Nitrite: NEGATIVE
Protein, ur: NEGATIVE mg/dL
Specific Gravity, Urine: 1.027 (ref 1.005–1.030)
pH: 5.5 (ref 5.0–8.0)

## 2015-10-06 MED ORDER — DEXAMETHASONE SODIUM PHOSPHATE 10 MG/ML IJ SOLN
10.0000 mg | Freq: Once | INTRAMUSCULAR | Status: AC
  Administered 2015-10-06: 10 mg via INTRAMUSCULAR
  Filled 2015-10-06: qty 1

## 2015-10-06 MED ORDER — MORPHINE SULFATE (PF) 4 MG/ML IV SOLN
INTRAVENOUS | Status: AC
  Administered 2015-10-06: 4 mg via INTRAMUSCULAR
  Filled 2015-10-06: qty 1

## 2015-10-06 MED ORDER — HYDROCODONE-ACETAMINOPHEN 5-325 MG PO TABS
1.0000 | ORAL_TABLET | Freq: Once | ORAL | Status: AC
  Administered 2015-10-06: 1 via ORAL
  Filled 2015-10-06: qty 1

## 2015-10-06 MED ORDER — GABAPENTIN 300 MG PO CAPS: 300.0000 mg | ORAL_CAPSULE | Freq: Three times a day (TID) | ORAL | 0 refills | Status: DC

## 2015-10-06 NOTE — ED Notes (Signed)
MRI called and pt requesting pain medicine, MRI reported that pt unable to sit still for MRI due to pain. Provider notified and verbal order for '4mg'$  morphine IM

## 2015-10-06 NOTE — ED Notes (Signed)
Pt transported to mri 

## 2015-10-06 NOTE — ED Triage Notes (Signed)
Patient complains of increasing pain to arms, legs and lower back that is increasing x 1 month. Unsure if related to arthritis or some other problem. Denies injury. Weak grips on assessment, normal sensation.

## 2015-10-06 NOTE — ED Provider Notes (Signed)
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 10/06/15 1816)  morphine 4 MG/ML injection (4 mg Intramuscular Given 10/06/15 1941)  dexamethasone (DECADRON) injection 10 mg (10 mg Intramuscular Given 10/06/15 2317)     Initial Impression / Assessment and Plan / ED Course  I have reviewed the triage vital signs and the nursing notes.  Pertinent labs & imaging results that were available during my care of the patient were reviewed by me and considered in my medical decision making (see chart for details).  Clinical Course   60 year old male presents with acute on chronic neck and back pain with worsening radicular symptoms. MRI of C, T, L spine reveals mild progression of advanced cervical disc degeneration, worst at C3-4 where there is moderate spinal stenosis with new mild myelomalacia. Thoracic disc degeneration at T1-2 and T2-3 with moderate to severe foraminal stenosis. Progressive multilevel disc and advanced facet degeneration in the lumbar spine. Mild spinal stenosis moderate to severe foraminal at L3-4 and L4-5. 5. Moderate left lateral recess stenosis at L4-5. Pain medicine given here in ED. Bladder scan revealed only 35cc urine despite complaint of urinary retention. Will give dose of decadron  and rx gabapentin and neurosurgery follow up. Strict return precautions given if he has any saddle anesthesia, loss of bowel or bladder continence, worsening urinary retention, or weakness in the legs   Final Clinical Impressions(s) / ED Diagnoses   Final diagnoses:  Bilateral arm weakness  Cervical radiculopathy  Bilateral sciatica  Degenerative disc disease, cervical    New Prescriptions Discharge Medication List as of 10/06/2015 10:42 PM       Recardo Evangelist, PA-C 10/07/15 [place], MD 10/08/15 6004

## 2015-10-06 NOTE — ED Notes (Signed)
Pt verbalized understanding of d/c instructions and has no further questions. Pt stable and NAD. VSS.  
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
## 2015-10-06 NOTE — Discharge Instructions (Addendum)
Take Ibuprofen '600mg'$  three times a day for the next 5 days along with the Gabapentin. Please return to the ED if you experience numbness in the groin area, loss of bowel or bladder continence, or weakness in the legs

## 2015-10-25 ENCOUNTER — Other Ambulatory Visit: Payer: Self-pay | Admitting: Surgery

## 2015-10-25 DIAGNOSIS — C3490 Malignant neoplasm of unspecified part of unspecified bronchus or lung: Secondary | ICD-10-CM

## 2015-10-31 ENCOUNTER — Other Ambulatory Visit: Payer: Self-pay | Admitting: Neurosurgery

## 2015-11-06 ENCOUNTER — Ambulatory Visit (INDEPENDENT_AMBULATORY_CARE_PROVIDER_SITE_OTHER): Payer: Medicaid Other | Admitting: Physician Assistant

## 2015-11-06 ENCOUNTER — Encounter: Payer: Self-pay | Admitting: Physician Assistant

## 2015-11-06 VITALS — BP 132/90 | HR 54 | Ht 68.0 in | Wt 147.1 lb

## 2015-11-06 DIAGNOSIS — I2510 Atherosclerotic heart disease of native coronary artery without angina pectoris: Secondary | ICD-10-CM

## 2015-11-06 DIAGNOSIS — C3412 Malignant neoplasm of upper lobe, left bronchus or lung: Secondary | ICD-10-CM

## 2015-11-06 DIAGNOSIS — R001 Bradycardia, unspecified: Secondary | ICD-10-CM

## 2015-11-06 DIAGNOSIS — Z720 Tobacco use: Secondary | ICD-10-CM

## 2015-11-06 DIAGNOSIS — Z01810 Encounter for preprocedural cardiovascular examination: Secondary | ICD-10-CM | POA: Diagnosis not present
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
## 2015-11-06 NOTE — Patient Instructions (Signed)
Medication Instructions:  No changes.  Labwork: None   Testing/Procedures: None   Follow-Up: Dr. Kathlyn Sacramento in 1 year.  Any Other Special Instructions Will Be Listed Below (If Applicable).  If you need a refill on your cardiac medications before your next appointment, please call your pharmacy.

## 2015-11-14 ENCOUNTER — Ambulatory Visit (INDEPENDENT_AMBULATORY_CARE_PROVIDER_SITE_OTHER): Payer: Medicaid Other | Admitting: Surgery

## 2015-11-14 ENCOUNTER — Encounter: Payer: Self-pay | Admitting: Surgery

## 2015-11-14 ENCOUNTER — Ambulatory Visit
Admission: RE | Admit: 2015-11-14 | Discharge: 2015-11-14 | Disposition: A | Discharge status: Home / Self Care | Payer: Medicaid Other | Source: Ambulatory Visit | Attending: Surgery | Admitting: Surgery

## 2015-11-14 VITALS — BP 107/81 | HR 75 | Resp 18 | Ht 68.0 in | Wt 147.0 lb

## 2015-11-14 DIAGNOSIS — Z902 Acquired absence of lung [part of]: Secondary | ICD-10-CM | POA: Diagnosis not present

## 2015-11-14 DIAGNOSIS — R911 Solitary pulmonary nodule: Secondary | ICD-10-CM | POA: Diagnosis not present

## 2015-11-14 DIAGNOSIS — C3490 Malignant neoplasm of unspecified part of unspecified bronchus or lung: Secondary | ICD-10-CM

## 2015-11-14 DIAGNOSIS — C3412 Malignant neoplasm of upper lobe, left bronchus or lung: Secondary | ICD-10-CM | POA: Diagnosis not present

## 2015-11-15 ENCOUNTER — Encounter: Payer: Self-pay | Admitting: Surgery

## 2015-11-15 NOTE — Progress Notes (Signed)
HPI:  The patient returns today to review and discuss his CT scan done to follow up on a small spiculated nodule in the RUL. He is status post left upper lobectomy on 06/12/2011 for a stage IB (T2A, N0, M0) non-small cell lung cancer consistent with adenocarcinoma. This was a 2.0 cm tumor with visceral pleural invasion. A CT of the chest in March showed no PE but did show a new 7 mm spiculated nodule in the RUL. A follow up CT on 08/12/2015 showed no change in this nodule. He had PFT's on 06/21/2015 which showed a significant deterioration in lung function since his preop PFT's in 05/2011 with a diffusion capacity of only 26% predicted down from 31% and an FEV1 of 1.76, down from 2.5. He denies any headaches or visual changes. 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 He says that he is scheduled to have spine surgery by Dr. Vertell Limber later this month. He continues to smoke and reports shortness of breath with low level activity.  Current Outpatient Prescriptions  Medication Sig Dispense Refill  . acetaminophen (TYLENOL) 650 MG CR tablet Take 650-1,300 mg by mouth every 8 (eight) hours as needed for pain.    [name] albuterol (PROVENTIL HFA;VENTOLIN HFA) 108 (90 BASE) MCG/ACT inhaler Inhale 2 puffs into the lungs every 6 (six) hours as needed for wheezing or shortness of breath.    [name] aspirin EC 81 MG tablet Take 81 mg by mouth at bedtime.    . Aspirin-Salicylamide-Caffeine (BC HEADACHE POWDER PO) Take 1 packet by mouth every 4 (four) hours as needed (pain).    [name] gabapentin (NEURONTIN) 300 MG capsule Take 1 capsule (300 mg total) by mouth 3 (three) times daily. 90 capsule 0  . guaiFENesin-dextromethorphan (ROBITUSSIN DM) 100-10 MG/5ML syrup Take by mouth at bedtime as needed for cough (sore throat).    [name] ibuprofen (ADVIL,MOTRIN) 800 MG tablet Take 800 mg by mouth every 2 (two) hours as needed (pain).    [name] omeprazole (PRILOSEC)  20 MG capsule Take 20 mg by mouth daily.    . OXYGEN Inhale 2 L into the lungs at bedtime.    [name] tiotropium (SPIRIVA) 18 MCG inhalation capsule Place 18 mcg into inhaler and inhale daily.     No current facility-administered medications for this visit.      Physical Exam: BP 107/81   Pulse 75   Resp 18   Ht '5\' 8"'$  (1.727 m)   Wt 147 lb (66.7 kg)   SpO2 94% Comment: ON RA  BMI 22.35 kg/m  He looks well  There is no cervical or supraclavicular adenopathy.  Lung exam is clear.  The left thoracotomy scar is unremarkable.  Abdominal exam shows active bowel sounds. Abdomen is soft, flat and nontender. There are no palpable masses or organomegaly.   Diagnostic Tests:  CLINICAL DATA:  Follow-up lung nodule, history of left lung cancer in 2013 cough and shortness of breath  EXAM: CT CHEST WITHOUT CONTRAST  TECHNIQUE: Multidetector CT imaging of the chest was performed following the standard protocol without IV contrast.  COMPARISON:  CT 08/12/2015, 07/25/2015, 04/16/2015, 06/21/2014, 06/22/2013  FINDINGS: Cardiovascular: Limited without intravenous contrast. Mild ectasia of the ascending aorta up to 3.7 cm. Vascular calcifications are present within the aorta and great vessels. Coronary artery calcifications are present. Heart size upper normal. No large pericardial effusion.  Mediastinum/Nodes: Surgical clips are again noted in  the left hilum with postsurgical changes noted. Unchanged mediastinal lymph nodes. Pre tracheal lymph node measures 0.7 cm compared with 0.7 cm previously. Trachea is within normal limits.  Lungs/Pleura: Hyperinflation with marked emphysematous disease bilaterally. In the apical portion of the right upper lobe, there is a 9 x 6 mm nodule (8 mm mean diameter) on image 28 of series 4. Previously this nodule measured 6 mm. There is no acute infiltrate, pneumothorax, or pleural effusion. There is mild scarring in the left lung base. Mild  dependent atelectasis within the posterior right lung base. Mild right middle lobe atelectasis.  Upper Abdomen: Atherosclerosis of the abdominal aorta. Mild nodular enlargement of left adrenal gland unchanged.  Musculoskeletal: No acute osseous abnormality.  IMPRESSION: 1. Apparent slight interval enlargement of a spiculated soft tissue nodule within the apical portion of the right upper lobe with mean diameter of 8 mm. Given spiculated morphology and slight increase in size, consider PET-CT for further evaluation. 2. Extensive emphysematous disease of the bilateral lungs. Stable postoperative changes in the left hilum. 3. Stable mildly prominent mediastinal lymph nodes 4. Atherosclerotic vascular disease. Coronary artery calcifications.   Electronically Signed   By: Donavan Foil M.D.   On: 11/14/2015 14:36   Impression:  This RUL lung nodule is minimally enlarged since it was first seen on CT scan on 04/16/2015. It was 7 mm at that time and is now 8 mm. I think it is still probably below the threshold for PET scan and too small to biopsy. His diffusion capacity was markedly reduced on PFT's in 06/2015 at 26% predicted and FEV1 was moderately depressed at 1.76. I think he might have a tough time with surgical resection even if it was a wedge or segmentectomy. I think it is probably best to continue following this lesion for a while since it is small and minimally changed since it was found in March. I will plan to do a repeat CT in 4 months and if there is further enlargement will consider a PET scan. He is planning to have spine surgery later this month and should be recovered by then. I reviewed his CT scans with him and discussed my recommendation. He is in agreement. I strongly advised him to abstain from smoking.  Plan:  Follow up in 4 months with CT of the chest.   Gaye Pollack, MD [facility] [phone]

## 2015-11-20 ENCOUNTER — Encounter (HOSPITAL_COMMUNITY)
Admission: RE | Admit: 2015-11-20 | Discharge: 2015-11-20 | Disposition: A | Discharge status: Home / Self Care | Payer: Medicaid Other | Source: Ambulatory Visit | Attending: Neurosurgery | Admitting: Neurosurgery

## 2015-11-20 ENCOUNTER — Other Ambulatory Visit (HOSPITAL_COMMUNITY): Payer: Medicaid Other

## 2015-11-20 DIAGNOSIS — M5000 Cervical disc disorder with myelopathy, unspecified cervical region: Secondary | ICD-10-CM | POA: Insufficient documentation

## 2015-11-20 DIAGNOSIS — Z79899 Other long term (current) drug therapy: Secondary | ICD-10-CM | POA: Insufficient documentation

## 2015-11-20 DIAGNOSIS — Z0183 Encounter for blood typing: Secondary | ICD-10-CM | POA: Insufficient documentation

## 2015-11-20 DIAGNOSIS — Z902 Acquired absence of lung [part of]: Secondary | ICD-10-CM | POA: Diagnosis not present

## 2015-11-20 DIAGNOSIS — Z7982 Long term (current) use of aspirin: Secondary | ICD-10-CM | POA: Diagnosis not present

## 2015-11-20 DIAGNOSIS — Z01818 Encounter for other preprocedural examination: Secondary | ICD-10-CM | POA: Insufficient documentation

## 2015-11-20 DIAGNOSIS — J439 Emphysema, unspecified: Secondary | ICD-10-CM | POA: Diagnosis not present

## 2015-11-20 DIAGNOSIS — F17200 Nicotine dependence, unspecified, uncomplicated: Secondary | ICD-10-CM | POA: Diagnosis not present

## 2015-11-20 DIAGNOSIS — E785 Hyperlipidemia, unspecified: Secondary | ICD-10-CM | POA: Insufficient documentation

## 2015-11-20 DIAGNOSIS — I2510 Atherosclerotic heart disease of native coronary artery without angina pectoris: Secondary | ICD-10-CM | POA: Insufficient documentation

## 2015-11-20 DIAGNOSIS — Z85118 Personal history of other malignant neoplasm of bronchus and lung: Secondary | ICD-10-CM | POA: Diagnosis not present

## 2015-11-20 DIAGNOSIS — Z01812 Encounter for preprocedural laboratory examination: Secondary | ICD-10-CM | POA: Diagnosis not present

## 2015-11-20 LAB — CBC
HCT: 46.7 % (ref 39.0–52.0)
Hemoglobin: 15.7 g/dL (ref 13.0–17.0)
MCH: 30.6 pg (ref 26.0–34.0)
MCHC: 33.6 g/dL (ref 30.0–36.0)
MCV: 91 fL (ref 78.0–100.0)
Platelets: 281 10*3/uL (ref 150–400)
RBC: 5.13 MIL/uL (ref 4.22–5.81)
RDW: 14.3 % (ref 11.5–15.5)
WBC: 9.5 10*3/uL (ref 4.0–10.5)

## 2015-11-20 LAB — BASIC METABOLIC PANEL
Anion gap: 8 (ref 5–15)
BUN: 11 mg/dL (ref 6–20)
CO2: 24 mmol/L (ref 22–32)
Calcium: 9.7 mg/dL (ref 8.9–10.3)
Chloride: 107 mmol/L (ref 101–111)
Creatinine, Ser: 1.22 mg/dL (ref 0.61–1.24)
GFR calc Af Amer: >60 mL/min (ref >60)
GFR calc non Af Amer: >60 mL/min (ref >60)
Glucose, Bld: 90 mg/dL (ref 65–99)
Potassium: 4.6 mmol/L (ref 3.5–5.1)
Sodium: 139 mmol/L (ref 135–145)

## 2015-11-20 LAB — SURGICAL PCR SCREEN
MRSA, PCR: NEGATIVE
Staphylococcus aureus: NEGATIVE

## 2015-11-20 LAB — TYPE AND SCREEN
ABO/RH(D): O POS
Antibody Screen: NEGATIVE

## 2015-11-20 NOTE — Pre-Procedure Instructions (Signed)
[name]  11/20/2015      [facility] [place], [place] - [address] [place] [address] Phone: [phone] Fax: [phone]    Your procedure is scheduled on Tuesday, October 24.  Report to [facility] Admitting at 5:30 AM.                Your surgery or procedure is scheduled for 7:30 AM   Call this number if you have problems the morning of [phone]                 For any other questions, please call [phone], Monday - Friday 8 AM - 4 PM.   Remember:  Do not eat food or drink liquids after midnight Monday, October 23.  Take these medicines the morning of surgery with A SIP OF WATER : omeprazole (PRILOSEC).  Use tiotropium (SPIRIVA), may use albuterol (PROVENTIL HFA;VENTOLIN HFA) inhaler and please bring it with you to the hospital.               1 Week prior to surgery STOP taking Aspirin , Aspirin Products (Goody Powder, Excedrin Migraine), Ibuprofen (Advil), Naproxen (Aleve), Vitamins and Herbal Products (ie Fish Oil).                    [place]- Preparing For Surgery  Before surgery, you can play an important role. Because skin is not sterile, your skin needs to be as free of germs as possible. You can reduce the number of germs on your skin by washing with CHG (chlorahexidine gluconate) Soap before surgery.  CHG is an antiseptic cleaner which kills germs and bonds with the skin to continue killing germs even after washing.  Please do not use if you have an allergy to CHG or antibacterial soaps. If your skin becomes reddened/irritated stop using the CHG.  Do not shave (including legs and underarms) for at least 48 hours prior to first CHG shower. It is OK to shave your face.  Please follow these instructions carefully.   1. Shower the NIGHT BEFORE SURGERY and the MORNING OF SURGERY with CHG.   2. If you chose to wash your hair, wash your hair first as usual with your normal  shampoo.  3. After you shampoo, rinse your hair and body thoroughly to remove the shampoo.  4. Use CHG as you would any other liquid soap. You can apply CHG directly to the skin and wash gently with a scrungie or a clean washcloth.   5. Apply the CHG Soap to your body ONLY FROM THE NECK DOWN.  Do not use on open wounds or open sores. Avoid contact with your eyes, ears, mouth and genitals (private parts). Wash genitals (private parts) with your normal soap.  6. Wash thoroughly, paying special attention to the area where your surgery will be performed.  7. Thoroughly rinse your body with warm water from the neck down.  8. DO NOT shower/wash with your normal soap after using and rinsing off the CHG Soap.  9. Pat yourself dry with a CLEAN TOWEL.   10. Wear CLEAN PAJAMAS   11. Place CLEAN SHEETS on your bed the night of your first shower and DO NOT SLEEP WITH PETS.  Day of Surgery: Do not apply any deodorants/lotions. Please wear clean clothes to the hospital/surgery center.     Do not wear jewelry, make-up or nail polish.  Do not wear lotions,  powders, or perfumes, or deodorant.             Men may shave face and neck.  Do not bring valuables to the hospital.  [facility] is not responsible for any belongings or valuables.  Contacts, dentures or bridgework may not be worn into surgery.  Leave your suitcase in the car.  After surgery it may be brought to your room.  For patients admitted to the hospital, discharge time will be determined by your treatment team.  Patients discharged the day of surgery will not be allowed to drive home.   Special instructions:  -  Please read over the following fact sheets that you were given.  Patient Instructions for Mupirocin Application

## 2015-11-20 NOTE — Progress Notes (Signed)
Anesthesia Chart Review:  Pt is a 60 year old male scheduled for C3-4, C4-5, C5-6 ACDF on 11/27/2015 with Erline Levine, MD.   - CT surgeon is Gilford Raid, MD, who is watching pulmonary nodule.  Last office visit 11/14/15.  - Cardiologist is Kathlyn Sacramento, MD, last office visit 11/06/15 with Richardson Dopp, PA who cleared pt for surgery in consult with Daneen Schick, MD.   - PCP is Juluis Mire, NP - Pulmonologist is Baltazar Apo, MD (last office visit 06/21/15)  PMH includes:  CAD, hyperlipidemia, emphysema, alcoholic pancreatitis, lung cancer (s/p VATS, lobectomy 2013). Hx of cocaine and alcohol abuse. Current smoker. BMI 22.   Medications include: albuterol, ASA, prilosec, spiriva.   Preoperative labs reviewed.    CT chest 11/14/15:  1. Apparent slight interval enlargement of a spiculated soft tissue nodule within the apical portion of the right upper lobe with mean diameter of 8 mm. Given spiculated morphology and slight increase in size, consider PET-CT for further evaluation. 2. Extensive emphysematous disease of the bilateral lungs. Stable postoperative changes in the left hilum. 3. Stable mildly prominent mediastinal lymph nodes 4. Atherosclerotic vascular disease. Coronary artery calcifications.  EKG 11/06/15: Sinus bradycardia (54 bpm) with sinus arrhythmia. LAFB.  Echo 06/27/14:  - Left ventricle: The cavity size was normal. Systolic function was normal. The estimated ejection fraction was in the range of 50% to 55%. Wall motion was normal; there were no regional wall motion abnormalities. - Atrial septum: No defect or patent foramen ovale was identified.  Holter monitor 06/22/14:  1. Sinus bradycardia with no evidence of  AV block . Lowest heart rate was 32 bpm. occasional junctional rhythm. 2. Sinus tachycardia with PACs .   Nuclear stress test 06/22/14:  - Electrically negative for ischemia - Myoview with evidence of scar and possible soft tissue attenuation in the  inferior/inferolateral walls  No significant ischemia - LVEF on gating calculated at 43%   - High risk nuclear stress test based on perfsuion defect size  - Richardson Dopp, PA notes that stress test results similar to test results from 2013 prior to cath and he suspects no change.   Cardiac cath 06/04/11:  1. Mild non-obstructive CAD (LAD 20%, Cx 20%, RCA 20%) 2. Normal LV systolic function  If no changes, I anticipate pt can proceed with surgery as scheduled.   Willeen Cass, FNP-BC [facility] Short Stay Surgical Center/Anesthesiology Phone: [phone] 11/20/2015 3:19 PM

## 2015-11-26 ENCOUNTER — Telehealth: Payer: Self-pay | Admitting: Emergency Medicine

## 2015-11-26 ENCOUNTER — Encounter (HOSPITAL_COMMUNITY): Payer: Self-pay | Admitting: Anesthesiology

## 2015-11-26 MED ORDER — TIOTROPIUM BROMIDE MONOHYDRATE 18 MCG IN CAPS: 18.0000 ug | ORAL_CAPSULE | Freq: Every day | RESPIRATORY_TRACT | 3 refills | Status: DC

## 2015-11-26 NOTE — Telephone Encounter (Signed)
Patient called again stating he will be leaving for [facility] on [address]. shortly and is checking to see status of Spiriva refill. Advised I would mark this as urgent.  CB is [phone].

## 2015-11-26 NOTE — Telephone Encounter (Signed)
Spoke with pt and advised that refill for Spiriva was sent to pharmacy

## 2015-11-27 ENCOUNTER — Inpatient Hospital Stay (HOSPITAL_COMMUNITY): Payer: Medicaid Other

## 2015-11-27 ENCOUNTER — Encounter (HOSPITAL_COMMUNITY)
Admission: RE | Disposition: A | Discharge status: Home / Self Care | Payer: Self-pay | Source: Ambulatory Visit | Attending: Neurosurgery

## 2015-11-27 ENCOUNTER — Inpatient Hospital Stay (HOSPITAL_COMMUNITY)
Admission: RE | Admit: 2015-11-27 | Discharge: 2015-11-29 | DRG: 472 | Disposition: A | Discharge status: Home / Self Care | Payer: Medicaid Other | Source: Ambulatory Visit | Attending: Neurosurgery | Admitting: Neurosurgery

## 2015-11-27 ENCOUNTER — Encounter (HOSPITAL_COMMUNITY): Payer: Self-pay | Admitting: *Deleted

## 2015-11-27 ENCOUNTER — Inpatient Hospital Stay (HOSPITAL_COMMUNITY): Payer: Medicaid Other | Admitting: Anesthesiology

## 2015-11-27 ENCOUNTER — Inpatient Hospital Stay (HOSPITAL_COMMUNITY): Payer: Medicaid Other | Admitting: Emergency Medicine

## 2015-11-27 DIAGNOSIS — F17210 Nicotine dependence, cigarettes, uncomplicated: Secondary | ICD-10-CM | POA: Diagnosis present

## 2015-11-27 DIAGNOSIS — J449 Chronic obstructive pulmonary disease, unspecified: Secondary | ICD-10-CM | POA: Diagnosis present

## 2015-11-27 DIAGNOSIS — M4802 Spinal stenosis, cervical region: Secondary | ICD-10-CM | POA: Diagnosis present

## 2015-11-27 DIAGNOSIS — Z419 Encounter for procedure for purposes other than remedying health state, unspecified: Secondary | ICD-10-CM

## 2015-11-27 DIAGNOSIS — Z9981 Dependence on supplemental oxygen: Secondary | ICD-10-CM

## 2015-11-27 DIAGNOSIS — K219 Gastro-esophageal reflux disease without esophagitis: Secondary | ICD-10-CM | POA: Diagnosis present

## 2015-11-27 DIAGNOSIS — M5412 Radiculopathy, cervical region: Secondary | ICD-10-CM | POA: Diagnosis present

## 2015-11-27 DIAGNOSIS — I2510 Atherosclerotic heart disease of native coronary artery without angina pectoris: Secondary | ICD-10-CM | POA: Diagnosis present

## 2015-11-27 DIAGNOSIS — Z85118 Personal history of other malignant neoplasm of bronchus and lung: Secondary | ICD-10-CM | POA: Diagnosis not present

## 2015-11-27 DIAGNOSIS — G9761 Postprocedural hematoma of a nervous system organ or structure following a nervous system procedure: Secondary | ICD-10-CM | POA: Diagnosis not present

## 2015-11-27 DIAGNOSIS — M1990 Unspecified osteoarthritis, unspecified site: Secondary | ICD-10-CM | POA: Diagnosis present

## 2015-11-27 DIAGNOSIS — Z79899 Other long term (current) drug therapy: Secondary | ICD-10-CM | POA: Diagnosis not present

## 2015-11-27 DIAGNOSIS — M50022 Cervical disc disorder at C5-C6 level with myelopathy: Secondary | ICD-10-CM | POA: Diagnosis present

## 2015-11-27 DIAGNOSIS — Y92234 Operating room of hospital as the place of occurrence of the external cause: Secondary | ICD-10-CM | POA: Diagnosis not present

## 2015-11-27 DIAGNOSIS — G959 Disease of spinal cord, unspecified: Secondary | ICD-10-CM | POA: Diagnosis present

## 2015-11-27 DIAGNOSIS — Y838 Other surgical procedures as the cause of abnormal reaction of the patient, or of later complication, without mention of misadventure at the time of the procedure: Secondary | ICD-10-CM | POA: Diagnosis not present

## 2015-11-27 HISTORY — PX: ANTERIOR CERVICAL DECOMP/DISCECTOMY FUSION: SHX1161

## 2015-11-27 SURGERY — ANTERIOR CERVICAL DECOMPRESSION/DISCECTOMY FUSION 3 LEVELS
Anesthesia: General

## 2015-11-27 MED ORDER — ZOLPIDEM TARTRATE 5 MG PO TABS
5.0000 mg | ORAL_TABLET | Freq: Every evening | ORAL | Status: DC | PRN
  Administered 2015-11-27: 5 mg via ORAL
  Filled 2015-11-27: qty 1

## 2015-11-27 MED ORDER — CHLORHEXIDINE GLUCONATE CLOTH 2 % EX PADS: 6.0000 | MEDICATED_PAD | Freq: Once | CUTANEOUS | Status: DC

## 2015-11-27 MED ORDER — CEFAZOLIN IN D5W 1 GM/50ML IV SOLN
1.0000 g | Freq: Three times a day (TID) | INTRAVENOUS | Status: AC
  Administered 2015-11-27 – 2015-11-28 (×2): 1 g via INTRAVENOUS
  Filled 2015-11-27 (×2): qty 50

## 2015-11-27 MED ORDER — ASPIRIN EC 81 MG PO TBEC
81.0000 mg | DELAYED_RELEASE_TABLET | Freq: Every day | ORAL | Status: DC
  Administered 2015-11-27 – 2015-11-28 (×2): 81 mg via ORAL
  Filled 2015-11-27 (×2): qty 1

## 2015-11-27 MED ORDER — PROMETHAZINE HCL 25 MG/ML IJ SOLN: 6.2500 mg | INTRAMUSCULAR | Status: DC | PRN

## 2015-11-27 MED ORDER — ACETAMINOPHEN 650 MG RE SUPP: 650.0000 mg | RECTAL | Status: DC | PRN

## 2015-11-27 MED ORDER — HYDROMORPHONE HCL 1 MG/ML IJ SOLN
0.5000 mg | INTRAMUSCULAR | Status: DC | PRN
  Administered 2015-11-27 (×2): 1 mg via INTRAVENOUS
  Filled 2015-11-27 (×2): qty 1

## 2015-11-27 MED ORDER — LIDOCAINE-EPINEPHRINE 1 %-1:100000 IJ SOLN
INTRAMUSCULAR | Status: AC
  Filled 2015-11-27: qty 1

## 2015-11-27 MED ORDER — ALBUTEROL SULFATE HFA 108 (90 BASE) MCG/ACT IN AERS
INHALATION_SPRAY | RESPIRATORY_TRACT | Status: AC
  Filled 2015-11-27: qty 6.7

## 2015-11-27 MED ORDER — LIDOCAINE 2% (20 MG/ML) 5 ML SYRINGE
INTRAMUSCULAR | Status: AC
  Filled 2015-11-27: qty 5

## 2015-11-27 MED ORDER — METHOCARBAMOL 1000 MG/10ML IJ SOLN
500.0000 mg | Freq: Four times a day (QID) | INTRAVENOUS | Status: DC | PRN
  Filled 2015-11-27: qty 5

## 2015-11-27 MED ORDER — CEFAZOLIN SODIUM-DEXTROSE 2-4 GM/100ML-% IV SOLN
2.0000 g | INTRAVENOUS | Status: AC
  Administered 2015-11-27: 2 g via INTRAVENOUS

## 2015-11-27 MED ORDER — PHENYLEPHRINE 40 MCG/ML (10ML) SYRINGE FOR IV PUSH (FOR BLOOD PRESSURE SUPPORT)
PREFILLED_SYRINGE | INTRAVENOUS | Status: AC
  Filled 2015-11-27: qty 10

## 2015-11-27 MED ORDER — SUGAMMADEX SODIUM 500 MG/5ML IV SOLN
INTRAVENOUS | Status: AC
  Filled 2015-11-27: qty 5

## 2015-11-27 MED ORDER — MIDAZOLAM HCL 2 MG/2ML IJ SOLN
0.5000 mg | Freq: Once | INTRAMUSCULAR | Status: AC | PRN
  Administered 2015-11-27: 2 mg via INTRAVENOUS

## 2015-11-27 MED ORDER — LACTATED RINGERS IV SOLN
INTRAVENOUS | Status: DC | PRN
  Administered 2015-11-27 (×2): via INTRAVENOUS

## 2015-11-27 MED ORDER — GELATIN ABSORBABLE MT POWD
OROMUCOSAL | Status: DC | PRN
  Administered 2015-11-27: 11:00:00 via TOPICAL

## 2015-11-27 MED ORDER — PHENOL 1.4 % MT LIQD: 1.0000 | OROMUCOSAL | Status: DC | PRN

## 2015-11-27 MED ORDER — LIDOCAINE-EPINEPHRINE 1 %-1:100000 IJ SOLN
INTRAMUSCULAR | Status: DC | PRN
  Administered 2015-11-27: 6 mL

## 2015-11-27 MED ORDER — DOCUSATE SODIUM 100 MG PO CAPS
100.0000 mg | ORAL_CAPSULE | Freq: Two times a day (BID) | ORAL | Status: DC
  Administered 2015-11-27 – 2015-11-28 (×3): 100 mg via ORAL
  Filled 2015-11-27 (×3): qty 1

## 2015-11-27 MED ORDER — DEXAMETHASONE SODIUM PHOSPHATE 10 MG/ML IJ SOLN
INTRAMUSCULAR | Status: AC
  Filled 2015-11-27: qty 1

## 2015-11-27 MED ORDER — BUPIVACAINE HCL (PF) 0.5 % IJ SOLN
INTRAMUSCULAR | Status: AC
  Filled 2015-11-27: qty 30

## 2015-11-27 MED ORDER — ARTIFICIAL TEARS OP OINT
TOPICAL_OINTMENT | OPHTHALMIC | Status: DC | PRN
  Administered 2015-11-27: 1 via OPHTHALMIC

## 2015-11-27 MED ORDER — KCL IN DEXTROSE-NACL 20-5-0.45 MEQ/L-%-% IV SOLN
INTRAVENOUS | Status: DC
  Administered 2015-11-27: 75 mL/h via INTRAVENOUS

## 2015-11-27 MED ORDER — KETAMINE HCL-SODIUM CHLORIDE 100-0.9 MG/10ML-% IV SOSY
PREFILLED_SYRINGE | INTRAVENOUS | Status: AC
  Filled 2015-11-27: qty 10

## 2015-11-27 MED ORDER — PROPOFOL 10 MG/ML IV BOLUS
INTRAVENOUS | Status: AC
  Filled 2015-11-27: qty 20

## 2015-11-27 MED ORDER — LIDOCAINE HCL (CARDIAC) 20 MG/ML IV SOLN
INTRAVENOUS | Status: DC | PRN
  Administered 2015-11-27 (×2): 20 mg via INTRAVENOUS

## 2015-11-27 MED ORDER — TIOTROPIUM BROMIDE MONOHYDRATE 18 MCG IN CAPS
18.0000 ug | ORAL_CAPSULE | Freq: Every day | RESPIRATORY_TRACT | Status: DC
  Filled 2015-11-27: qty 5

## 2015-11-27 MED ORDER — ALUM & MAG HYDROXIDE-SIMETH 200-200-20 MG/5ML PO SUSP: 30.0000 mL | Freq: Four times a day (QID) | ORAL | Status: DC | PRN

## 2015-11-27 MED ORDER — ONDANSETRON HCL 4 MG/2ML IJ SOLN
INTRAMUSCULAR | Status: DC | PRN
  Administered 2015-11-27: 4 mg via INTRAVENOUS

## 2015-11-27 MED ORDER — THROMBIN 20000 UNITS EX SOLR
CUTANEOUS | Status: AC
  Filled 2015-11-27: qty 20000

## 2015-11-27 MED ORDER — ALBUTEROL SULFATE (2.5 MG/3ML) 0.083% IN NEBU: 2.5000 mg | INHALATION_SOLUTION | Freq: Four times a day (QID) | RESPIRATORY_TRACT | Status: DC | PRN

## 2015-11-27 MED ORDER — BUPIVACAINE HCL (PF) 0.5 % IJ SOLN
INTRAMUSCULAR | Status: DC | PRN
  Administered 2015-11-27: 6 mL

## 2015-11-27 MED ORDER — SUGAMMADEX SODIUM 200 MG/2ML IV SOLN
INTRAVENOUS | Status: AC
  Filled 2015-11-27: qty 2

## 2015-11-27 MED ORDER — MIDAZOLAM HCL 2 MG/2ML IJ SOLN
INTRAMUSCULAR | Status: AC
  Administered 2015-11-27: 2 mg via INTRAVENOUS
  Filled 2015-11-27: qty 2

## 2015-11-27 MED ORDER — MIDAZOLAM HCL 5 MG/5ML IJ SOLN
INTRAMUSCULAR | Status: DC | PRN
Start: 2015-11-27 — End: 2015-11-27
  Administered 2015-11-27: 2 mg via INTRAVENOUS

## 2015-11-27 MED ORDER — THROMBIN 5000 UNITS EX SOLR
CUTANEOUS | Status: AC
  Filled 2015-11-27: qty 5000

## 2015-11-27 MED ORDER — HYDROMORPHONE HCL 1 MG/ML IJ SOLN
0.2500 mg | INTRAMUSCULAR | Status: DC | PRN
  Administered 2015-11-27 (×2): 0.5 mg via INTRAVENOUS

## 2015-11-27 MED ORDER — SODIUM CHLORIDE 0.9 % IV SOLN: 250.0000 mL | INTRAVENOUS | Status: DC

## 2015-11-27 MED ORDER — OXYCODONE-ACETAMINOPHEN 5-325 MG PO TABS
1.0000 | ORAL_TABLET | ORAL | Status: DC | PRN
  Administered 2015-11-27 – 2015-11-29 (×7): 2 via ORAL
  Filled 2015-11-27 (×7): qty 2

## 2015-11-27 MED ORDER — 0.9 % SODIUM CHLORIDE (POUR BTL) OPTIME
TOPICAL | Status: DC | PRN
  Administered 2015-11-27: 1000 mL

## 2015-11-27 MED ORDER — FENTANYL CITRATE (PF) 100 MCG/2ML IJ SOLN
INTRAMUSCULAR | Status: DC | PRN
  Administered 2015-11-27: 25 ug via INTRAVENOUS
  Administered 2015-11-27: 50 ug via INTRAVENOUS
  Administered 2015-11-27: 25 ug via INTRAVENOUS
  Administered 2015-11-27: 100 ug via INTRAVENOUS

## 2015-11-27 MED ORDER — SUGAMMADEX SODIUM 200 MG/2ML IV SOLN
INTRAVENOUS | Status: DC | PRN
  Administered 2015-11-27: 250 mg via INTRAVENOUS

## 2015-11-27 MED ORDER — ACETAMINOPHEN 325 MG PO TABS: 650.0000 mg | ORAL_TABLET | ORAL | Status: DC | PRN

## 2015-11-27 MED ORDER — SURGIFOAM 100 EX MISC
CUTANEOUS | Status: DC | PRN
  Administered 2015-11-27: 11:00:00 via TOPICAL

## 2015-11-27 MED ORDER — ONDANSETRON HCL 4 MG/2ML IJ SOLN
INTRAMUSCULAR | Status: AC
  Filled 2015-11-27: qty 2

## 2015-11-27 MED ORDER — CEFAZOLIN SODIUM-DEXTROSE 2-4 GM/100ML-% IV SOLN
INTRAVENOUS | Status: AC
Start: 2015-11-27 — End: 2015-11-27
  Filled 2015-11-27: qty 100

## 2015-11-27 MED ORDER — METHOCARBAMOL 500 MG PO TABS
500.0000 mg | ORAL_TABLET | Freq: Four times a day (QID) | ORAL | Status: DC | PRN
  Administered 2015-11-27 – 2015-11-28 (×3): 500 mg via ORAL
  Filled 2015-11-27 (×3): qty 1

## 2015-11-27 MED ORDER — HYDROMORPHONE HCL 2 MG/ML IJ SOLN
INTRAMUSCULAR | Status: AC
  Administered 2015-11-27: 0.5 mg
  Filled 2015-11-27: qty 1

## 2015-11-27 MED ORDER — SODIUM CHLORIDE 0.9% FLUSH: 3.0000 mL | INTRAVENOUS | Status: DC | PRN

## 2015-11-27 MED ORDER — MENTHOL 3 MG MT LOZG: 1.0000 | LOZENGE | OROMUCOSAL | Status: DC | PRN

## 2015-11-27 MED ORDER — PHENYLEPHRINE HCL 10 MG/ML IJ SOLN
INTRAVENOUS | Status: DC | PRN
  Administered 2015-11-27: 20 ug/min via INTRAVENOUS

## 2015-11-27 MED ORDER — EPHEDRINE 5 MG/ML INJ
INTRAVENOUS | Status: AC
  Filled 2015-11-27: qty 10

## 2015-11-27 MED ORDER — SODIUM CHLORIDE 0.9% FLUSH
3.0000 mL | Freq: Two times a day (BID) | INTRAVENOUS | Status: DC
  Administered 2015-11-27: 3 mL via INTRAVENOUS

## 2015-11-27 MED ORDER — ROCURONIUM BROMIDE 10 MG/ML (PF) SYRINGE
PREFILLED_SYRINGE | INTRAVENOUS | Status: AC
  Filled 2015-11-27: qty 10

## 2015-11-27 MED ORDER — FLEET ENEMA 7-19 GM/118ML RE ENEM: 1.0000 | ENEMA | Freq: Once | RECTAL | Status: DC | PRN

## 2015-11-27 MED ORDER — ROCURONIUM BROMIDE 100 MG/10ML IV SOLN
INTRAVENOUS | Status: DC | PRN
  Administered 2015-11-27: 10 mg via INTRAVENOUS
  Administered 2015-11-27: 50 mg via INTRAVENOUS
  Administered 2015-11-27: 20 mg via INTRAVENOUS
  Administered 2015-11-27: 10 mg via INTRAVENOUS

## 2015-11-27 MED ORDER — MEPERIDINE HCL 25 MG/ML IJ SOLN: 6.2500 mg | INTRAMUSCULAR | Status: DC | PRN

## 2015-11-27 MED ORDER — ONDANSETRON HCL 4 MG/2ML IJ SOLN: 4.0000 mg | INTRAMUSCULAR | Status: DC | PRN

## 2015-11-27 MED ORDER — MIDAZOLAM HCL 2 MG/2ML IJ SOLN
INTRAMUSCULAR | Status: AC
  Filled 2015-11-27: qty 2

## 2015-11-27 MED ORDER — FAMOTIDINE IN NACL 20-0.9 MG/50ML-% IV SOLN
20.0000 mg | Freq: Two times a day (BID) | INTRAVENOUS | Status: DC
  Administered 2015-11-27 (×2): 20 mg via INTRAVENOUS
  Filled 2015-11-27 (×2): qty 50

## 2015-11-27 MED ORDER — POLYETHYLENE GLYCOL 3350 17 G PO PACK: 17.0000 g | PACK | Freq: Every day | ORAL | Status: DC | PRN

## 2015-11-27 MED ORDER — PROPOFOL 10 MG/ML IV BOLUS
INTRAVENOUS | Status: DC | PRN
  Administered 2015-11-27: 130 mg via INTRAVENOUS

## 2015-11-27 MED ORDER — GLYCOPYRROLATE 0.2 MG/ML IV SOSY
PREFILLED_SYRINGE | INTRAVENOUS | Status: AC
  Filled 2015-11-27: qty 3

## 2015-11-27 MED ORDER — KCL IN DEXTROSE-NACL 20-5-0.45 MEQ/L-%-% IV SOLN
INTRAVENOUS | Status: AC
  Administered 2015-11-27: 75 mL/h via INTRAVENOUS
  Filled 2015-11-27: qty 1000

## 2015-11-27 MED ORDER — GABAPENTIN 300 MG PO CAPS
300.0000 mg | ORAL_CAPSULE | Freq: Three times a day (TID) | ORAL | Status: DC
  Administered 2015-11-27 – 2015-11-28 (×3): 300 mg via ORAL
  Filled 2015-11-27 (×3): qty 1

## 2015-11-27 MED ORDER — HYDROCODONE-ACETAMINOPHEN 5-325 MG PO TABS: 1.0000 | ORAL_TABLET | ORAL | Status: DC | PRN

## 2015-11-27 MED ORDER — KETAMINE HCL 10 MG/ML IJ SOLN
INTRAMUSCULAR | Status: DC | PRN
  Administered 2015-11-27: 20 mg via INTRAVENOUS
  Administered 2015-11-27: 30 mg via INTRAVENOUS
  Administered 2015-11-27: 10 mg via INTRAVENOUS

## 2015-11-27 MED ORDER — FENTANYL CITRATE (PF) 100 MCG/2ML IJ SOLN
INTRAMUSCULAR | Status: AC
  Filled 2015-11-27: qty 2

## 2015-11-27 MED ORDER — LACTATED RINGERS IV SOLN: INTRAVENOUS | Status: DC

## 2015-11-27 MED ORDER — PANTOPRAZOLE SODIUM 40 MG PO TBEC
40.0000 mg | DELAYED_RELEASE_TABLET | Freq: Every day | ORAL | Status: DC
  Administered 2015-11-27: 40 mg via ORAL
  Filled 2015-11-27: qty 1

## 2015-11-27 MED ORDER — BISACODYL 10 MG RE SUPP: 10.0000 mg | Freq: Every day | RECTAL | Status: DC | PRN

## 2015-11-27 SURGICAL SUPPLY — 76 items
BIT DRILL 14X2.5XNS TI ANT (BIT) ×1 IMPLANT
BIT DRILL AVIATOR 14 (BIT) ×1
BIT DRILL AVIATOR 14MM (BIT) ×1
BIT DRILL NEURO 2X3.1 SFT TUCH (MISCELLANEOUS) ×1 IMPLANT
BIT DRL 14X2.5XNS TI ANT (BIT) ×1
BLADE ULTRA TIP 2M (BLADE) IMPLANT
BNDG GAUZE ELAST 4 BULKY (GAUZE/BANDAGES/DRESSINGS) IMPLANT
BUR BARREL STRAIGHT FLUTE 4.0 (BURR) ×6 IMPLANT
CANISTER SUCT 3000ML PPV (MISCELLANEOUS) ×3 IMPLANT
COVER MAYO STAND STRL (DRAPES) ×3 IMPLANT
DECANTER SPIKE VIAL GLASS SM (MISCELLANEOUS) ×3 IMPLANT
DERMABOND ADHESIVE PROPEN (GAUZE/BANDAGES/DRESSINGS) ×2
DERMABOND ADVANCED (GAUZE/BANDAGES/DRESSINGS) ×2
DERMABOND ADVANCED .7 DNX12 (GAUZE/BANDAGES/DRESSINGS) ×1 IMPLANT
DERMABOND ADVANCED .7 DNX6 (GAUZE/BANDAGES/DRESSINGS) ×1 IMPLANT
DRAPE HALF SHEET 40X57 (DRAPES) IMPLANT
DRAPE LAPAROTOMY 100X72 PEDS (DRAPES) ×3 IMPLANT
DRAPE MICROSCOPE LEICA (MISCELLANEOUS) ×3 IMPLANT
DRAPE POUCH INSTRU U-SHP 10X18 (DRAPES) ×3 IMPLANT
DRILL NEURO 2X3.1 SOFT TOUCH (MISCELLANEOUS) ×3
DRSG OPSITE POSTOP 3X4 (GAUZE/BANDAGES/DRESSINGS) ×3 IMPLANT
DURAPREP 6ML APPLICATOR 50/CS (WOUND CARE) ×3 IMPLANT
ELECT COATED BLADE 2.86 ST (ELECTRODE) ×3 IMPLANT
ELECT REM PT RETURN 9FT ADLT (ELECTROSURGICAL) ×3
ELECTRODE REM PT RTRN 9FT ADLT (ELECTROSURGICAL) ×1 IMPLANT
GAUZE SPONGE 4X4 12PLY STRL (GAUZE/BANDAGES/DRESSINGS) IMPLANT
GAUZE SPONGE 4X4 16PLY XRAY LF (GAUZE/BANDAGES/DRESSINGS) IMPLANT
GLOVE BIO SURGEON STRL SZ8 (GLOVE) ×3 IMPLANT
GLOVE BIOGEL PI IND STRL 7.5 (GLOVE) ×2 IMPLANT
GLOVE BIOGEL PI IND STRL 8 (GLOVE) ×1 IMPLANT
GLOVE BIOGEL PI IND STRL 8.5 (GLOVE) ×1 IMPLANT
GLOVE BIOGEL PI INDICATOR 7.5 (GLOVE) ×4
GLOVE BIOGEL PI INDICATOR 8 (GLOVE) ×2
GLOVE BIOGEL PI INDICATOR 8.5 (GLOVE) ×2
GLOVE ECLIPSE 8.0 STRL XLNG CF (GLOVE) ×3 IMPLANT
GLOVE EXAM NITRILE LRG STRL (GLOVE) IMPLANT
GLOVE EXAM NITRILE XL STR (GLOVE) IMPLANT
GLOVE EXAM NITRILE XS STR PU (GLOVE) IMPLANT
GLOVE SS BIOGEL STRL SZ 7 (GLOVE) ×2 IMPLANT
GLOVE SS BIOGEL STRL SZ 7.5 (GLOVE) ×1 IMPLANT
GLOVE SUPERSENSE BIOGEL SZ 7 (GLOVE) ×4
GLOVE SUPERSENSE BIOGEL SZ 7.5 (GLOVE) ×2
GOWN STRL REUS W/ TWL LRG LVL3 (GOWN DISPOSABLE) ×1 IMPLANT
GOWN STRL REUS W/ TWL XL LVL3 (GOWN DISPOSABLE) ×1 IMPLANT
GOWN STRL REUS W/TWL 2XL LVL3 (GOWN DISPOSABLE) ×3 IMPLANT
GOWN STRL REUS W/TWL LRG LVL3 (GOWN DISPOSABLE) ×2
GOWN STRL REUS W/TWL XL LVL3 (GOWN DISPOSABLE) ×2
HALTER HD/CHIN CERV TRACTION D (MISCELLANEOUS) ×3 IMPLANT
HEMOSTAT POWDER KIT SURGIFOAM (HEMOSTASIS) ×3 IMPLANT
KIT BASIN OR (CUSTOM PROCEDURE TRAY) ×3 IMPLANT
KIT ROOM TURNOVER OR (KITS) ×3 IMPLANT
NEEDLE HYPO 25X1 1.5 SAFETY (NEEDLE) ×3 IMPLANT
NEEDLE SPNL 18GX3.5 QUINCKE PK (NEEDLE) IMPLANT
NEEDLE SPNL 22GX3.5 QUINCKE BK (NEEDLE) ×6 IMPLANT
NS IRRIG 1000ML POUR BTL (IV SOLUTION) ×3 IMPLANT
PACK LAMINECTOMY NEURO (CUSTOM PROCEDURE TRAY) ×3 IMPLANT
PAD ARMBOARD 7.5X6 YLW CONV (MISCELLANEOUS) ×9 IMPLANT
PEEK SPACER AVS AS 6X14X16 4D (Cage) ×3 IMPLANT
PIN DISTRACTION 14MM (PIN) ×6 IMPLANT
PLATE AVIATOR ASSY 3LVL SZ 48 (Plate) ×3 IMPLANT
PUTTY DBX 1CC (Putty) ×3 IMPLANT
PUTTY DBX 1CC DEPUY (Putty) ×1 IMPLANT
RUBBERBAND STERILE (MISCELLANEOUS) ×6 IMPLANT
SCREW AVIAT VAR SLFTAP 4.35X14 (Screw) ×3 IMPLANT
SCREW AVIATOR VAR SELFTAP 4X14 (Screw) ×21 IMPLANT
SPACER CERV AVS 14X16X5MM 4D (Spacer) ×6 IMPLANT
SPONGE INTESTINAL PEANUT (DISPOSABLE) ×3 IMPLANT
SPONGE SURGIFOAM ABS GEL 100 (HEMOSTASIS) ×3 IMPLANT
STAPLER SKIN PROX WIDE 3.9 (STAPLE) ×3 IMPLANT
SUT VIC AB 3-0 SH 8-18 (SUTURE) ×6 IMPLANT
SYR 5ML LL (SYRINGE) IMPLANT
TOWEL OR 17X24 6PK STRL BLUE (TOWEL DISPOSABLE) ×3 IMPLANT
TOWEL OR 17X26 10 PK STRL BLUE (TOWEL DISPOSABLE) ×3 IMPLANT
TRAP SPECIMEN MUCOUS 40CC (MISCELLANEOUS) ×3 IMPLANT
TRAY FOLEY W/METER SILVER 16FR (SET/KITS/TRAYS/PACK) IMPLANT
WATER STERILE IRR 1000ML POUR (IV SOLUTION) ×3 IMPLANT

## 2015-11-27 NOTE — Transfer of Care (Signed)
Immediate Anesthesia Transfer of Care Note  Patient: [name]  Procedure(s) Performed: Procedure(s): Cervical three-four Cervical four- five Cervical five- six ANTERIOR CERVICAL DECOMPRESSION/DISKECTOMY/FUSION (N/A)  Patient Location: PACU  Anesthesia Type:General  Level of Consciousness: awake and alert   Airway & Oxygen Therapy: Patient Spontanous Breathing and Patient connected to face mask oxygen  Post-op Assessment: Report given to RN and Post -op Vital signs reviewed and stable  Post vital signs: Reviewed and stable  Last Vitals:  Vitals:   11/27/15 0650  BP: 118/81  Pulse: (!) 42  Resp: 18  Temp: 36.6 C    Last Pain:  Vitals:   11/27/15 0650  TempSrc: Oral         Complications: No apparent anesthesia complications

## 2015-11-27 NOTE — Progress Notes (Signed)
Lunch relief by MA Kirstan Fentress RN 

## 2015-11-27 NOTE — Interval H&P Note (Signed)
History and Physical Interval Note:  11/27/2015 7:45 AM  [name]  has presented today for surgery, with the diagnosis of CERVICAL HERNIATED [place]  The various methods of treatment have been discussed with the patient and family. After consideration of risks, benefits and other options for treatment, the patient has consented to  Procedure(s) with comments: C3-4 C4-5 C5-6 ANTERIOR CERVICAL DECOMPRESSION/DISKECTOMY/FUSION (N/A) - C3-4 C4-5 C5-6 ANTERIOR CERVICAL DECOMPRESSION/DISKECTOMY/FUSION as a surgical intervention .  The patient's history has been reviewed, patient examined, no change in status, stable for surgery.  I have reviewed the patient's chart and labs.  Questions were answered to the patient's satisfaction.     Chane Magner D
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
## 2015-11-27 NOTE — Anesthesia Preprocedure Evaluation (Addendum)
Anesthesia Evaluation  Patient identified by MRN, date of birth, ID band Patient awake    Reviewed: Allergy & Precautions, NPO status , Patient's Chart, lab work & pertinent test results  History of Anesthesia Complications Negative for: history of anesthetic complications  Airway Mallampati: II  TM Distance: >3 FB Neck ROM: Full    Dental  (+) Edentulous Upper, Edentulous Lower   Pulmonary COPD (nocturnal O2),  COPD inhaler and oxygen dependent, Current Smoker,  H/o lung cancer   breath sounds clear to auscultation       Cardiovascular (-) angina Rhythm:Regular Rate:Normal  '12 ECHO: mild LVH, EF 65%, trace MR, trace TR '13 cath: minimal non-obstructive ASCAD '16 ECHO:  EF 50-55%, no WMA   Neuro/Psych Anxiety Depression    GI/Hepatic GERD  Medicated and Controlled,(+)     substance abuse  cocaine use,   Endo/Other  negative endocrine ROS  Renal/GU negative Renal ROS     Musculoskeletal  (+) Arthritis , Osteoarthritis,    Abdominal   Peds  Hematology   Anesthesia Other Findings   Reproductive/Obstetrics                           Anesthesia Physical Anesthesia Plan  ASA: III  Anesthesia Plan: General   Post-op Pain Management:    Induction: Intravenous  Airway Management Planned: Oral ETT and Video Laryngoscope Planned  Additional Equipment:   Intra-op Plan:   Post-operative Plan: Extubation in OR  Informed Consent: I have reviewed the patients History and Physical, chart, labs and discussed the procedure including the risks, benefits and alternatives for the proposed anesthesia with the patient or authorized representative who has indicated his/her understanding and acceptance.   Dental advisory given  Plan Discussed with: CRNA and Surgeon  Anesthesia Plan Comments: (Plan routine monitors, GETA with VideoGlide intubation)        Anesthesia Quick Evaluation

## 2015-11-27 NOTE — Brief Op Note (Signed)
11/27/2015  12:49 PM  PATIENT:  [name]  60 y.o. male  PRE-OPERATIVE DIAGNOSIS:  CERVICAL HERNIATED [place] WITH MYELOPATHY, cervical stenosis, cervical radiculopathy C 34, C 45, C 56 levels  POST-OPERATIVE DIAGNOSIS:  CERVICAL HERNIATED [place] WITH MYELOPATHY, cervical stenosis, cervical radiculopathy C 34, C 45, C 56 levels  PROCEDURE:  Procedure(s): Cervical three-four Cervical four- five Cervical five- six ANTERIOR CERVICAL DECOMPRESSION/DISKECTOMY/FUSION (N/A) with PEEK cages, autograft, DBM, anterior cervical plate  SURGEON:  Surgeon(s) and Role:    * Erline Levine, MD - Primary    * Kevan Ny Ditty, MD - Assisting  PHYSICIAN ASSISTANT:   ASSISTANTS: [place], RN   ANESTHESIA:   general  EBL:  Total I/O In: 1000 [I.V.:1000] Out: 50 [Blood:50]  BLOOD ADMINISTERED:none  DRAINS: none   LOCAL MEDICATIONS USED:  MARCAINE    and LIDOCAINE   SPECIMEN:  No Specimen  DISPOSITION OF SPECIMEN:  N/A  COUNTS:  YES  TOURNIQUET:  * No tourniquets in log *  DICTATION: Patient was brought to operating room and following the smooth and uncomplicated induction of general endotracheal anesthesia his head was placed on a horseshoe head holder he was placed in 5 pounds of Holter traction and his anterior neck was prepped and draped in usual sterile fashion. An incision was made on the left side of midline after infiltrating the skin and subcutaneous tissues with local lidocaine. The platysmal layer was incised and subplatysmal dissection was performed exposing the anterior border sternocleidomastoid muscle. Using blunt dissection the carotid sheath was kept lateral and trachea and esophagus kept medial exposing the anterior cervical spine. A bent spinal needle was placed it was felt to be the C 34 and C 45 levels and this was confirmed on intraoperative x-ray. Longus coli muscles were taken down from the anterior cervical spine using electrocautery and key elevator and self-retaining  retractor was placed. The interspace at C 34 was incised and a thorough discectomy was performed. Distraction pins were placed. Uncinate spurs and central spondylitic ridges were drilled down with a high-speed drill. The spinal cord dura and both C 4 nerve roots were widely decompressed. Hemostasis was assured. After trial sizing a 5 mm peek interbody cage was selected and packed with autograft and Demineralized Bone Matrix (DBM). The graft was tamped into position and countersunk appropriately. The retractor was moved and the interspace at C 45 was incised and a thorough discectomy was performed. Distraction pins were placed. Uncinate spurs and central spondylitic ridges were drilled down with a high-speed drill. The spinal cord dura and both C 5 nerve roots were widely decompressed. Hemostasis was assured. After trial sizing a 6 mm peek interbody cage was selected and packed in a similar fashion. The graft was tamped into position and countersunk appropriately.The interspace at C 56 was incised and a thorough discectomy was performed. Distraction pins were placed. Uncinate spurs and central spondylitic ridges were drilled down with a high-speed drill. The spinal cord dura and both C 6 nerve roots were widely decompressed. A large osteophyte was removed on the right with decompression of the right C5 nerve root. Hemostasis was assured. After trial sizing a 5 mm peek interbody cage was selected and packed in a similar fashion. The graft was tamped into position and countersunk appropriately.  Distraction weight was removed. A 48 mm Stryker Aviator anterior cervical plate was affixed to the cervical spine with 14 mm variable-angle screws 2 at C 3, 2 at C 4, 2 at C 5,  and  2 at C 6. All screws were well-positioned and locking mechanisms were engaged. Soft tissues were inspected and found to be in good repair. The wound was irrigated. A final x-ray was obtained with good visualization at C 3 - C 6 levels with the  interbody graft well visualized. The platysma layer was closed with 3-0 Vicryl stitches and the skin was reapproximated with 3-0 Vicryl subcuticular stitches. The wound was dressed with Dermabond. Counts were correct at the end of the case. Patient was extubated and taken to recovery in stable and satisfactory condition.    PLAN OF CARE: Admit to inpatient   PATIENT DISPOSITION:  PACU - hemodynamically stable.   Delay start of Pharmacological VTE agent (>24hrs) due to surgical blood loss or risk of bleeding: yes

## 2015-11-27 NOTE — Anesthesia Procedure Notes (Addendum)
Procedure Name: Intubation Date/Time: 11/27/2015 9:49 AM Performed by: Eligha Bridegroom Pre-anesthesia Checklist: Patient identified, Emergency Drugs available, Suction available, Timeout performed and Patient being monitored Patient Re-evaluated:Patient Re-evaluated prior to inductionOxygen Delivery Method: Circle system utilized Preoxygenation: Pre-oxygenation with 100% oxygen Intubation Type: IV induction Ventilation: Mask ventilation without difficulty Laryngoscope Size: Glidescope Grade View: Grade I Tube type: Oral Tube size: 7.0 mm Number of attempts: 1 Placement Confirmation: ETT inserted through vocal cords under direct vision,  positive ETCO2,  CO2 detector and breath sounds checked- equal and bilateral Secured at: 21 cm Tube secured with: Tape Dental Injury: Teeth and Oropharynx as per pre-operative assessment  Comments: Inserted by Collene Mares SRNA

## 2015-11-27 NOTE — Op Note (Signed)
11/27/2015  12:49 PM  PATIENT:  [name]  60 y.o. 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, cervical stenosis, cervical radiculopathy C 34, C 45, C 56 levels  PROCEDURE:  Procedure(s): Cervical three-four Cervical four- five Cervical five- six ANTERIOR CERVICAL DECOMPRESSION/DISKECTOMY/FUSION (N/A) with PEEK cages, autograft, DBM, anterior cervical plate  SURGEON:  Surgeon(s) and Role:    * Erline Levine, MD - Primary    * Kevan Ny Ditty, MD - Assisting  PHYSICIAN ASSISTANT:   ASSISTANTS: Poteat, RN   ANESTHESIA:   general  EBL:  Total I/O In: 1000 [I.V.:1000] Out: 50 [Blood:50]  BLOOD ADMINISTERED:none  DRAINS: none   LOCAL MEDICATIONS USED:  MARCAINE    and LIDOCAINE   SPECIMEN:  No Specimen  DISPOSITION OF SPECIMEN:  N/A  COUNTS:  YES  TOURNIQUET:  * No tourniquets in log *  DICTATION: Patient was brought to operating room and following the smooth and uncomplicated induction of general endotracheal anesthesia his head was placed on a horseshoe head holder he was placed in 5 pounds of Holter traction and his anterior neck was prepped and draped in usual sterile fashion. An incision was made on the left side of midline after infiltrating the skin and subcutaneous tissues with local lidocaine. The platysmal layer was incised and subplatysmal dissection was performed exposing the anterior border sternocleidomastoid muscle. Using blunt dissection the carotid sheath was kept lateral and trachea and esophagus kept medial exposing the anterior cervical spine. A bent spinal needle was placed it was felt to be the C 34 and C 45 levels and this was confirmed on intraoperative x-ray. Longus coli muscles were taken down from the anterior cervical spine using electrocautery and key elevator and self-retaining  retractor was placed. The interspace at C 34 was incised and a thorough discectomy was performed. Distraction pins were placed. Uncinate spurs and central spondylitic ridges were drilled down with a high-speed drill. The spinal cord dura and both C 4 nerve roots were widely decompressed. Hemostasis was assured. After trial sizing a 5 mm peek interbody cage was selected and packed with autograft and Demineralized Bone Matrix (DBM). The graft was tamped into position and countersunk appropriately. The retractor was moved and the interspace at C 45 was incised and a thorough discectomy was performed. Distraction pins were placed. Uncinate spurs and central spondylitic ridges were drilled down with a high-speed drill. The spinal cord dura and both C 5 nerve roots were widely decompressed. Hemostasis was assured. After trial sizing a 6 mm peek interbody cage was selected and packed in a similar fashion. The graft was tamped into position and countersunk appropriately.The interspace at C 56 was incised and a thorough discectomy was performed. Distraction pins were placed. Uncinate spurs and central spondylitic ridges were drilled down with a high-speed drill. The spinal cord dura and both C 6 nerve roots were widely decompressed. A large osteophyte was removed on the right with decompression of the right C5 nerve root. Hemostasis was assured. After trial sizing a 5 mm peek interbody cage was selected and packed in a similar fashion. The graft was tamped into position and countersunk appropriately.  Distraction weight was removed. A 48 mm Stryker Aviator anterior cervical plate was affixed to the cervical spine with 14 mm variable-angle screws 2 at C 3, 2 at C 4, 2 at C 5,  and  2 at C 6. All screws were well-positioned and locking mechanisms were engaged. Soft tissues were inspected and found to be in good repair. The wound was irrigated. A final x-ray was obtained with good visualization at C 3 - C 6 levels with the  interbody graft well visualized. The platysma layer was closed with 3-0 Vicryl stitches and the skin was reapproximated with 3-0 Vicryl subcuticular stitches. The wound was dressed with Dermabond. Counts were correct at the end of the case. Patient was extubated and taken to recovery in stable and satisfactory condition.    PLAN OF CARE: Admit to inpatient   PATIENT DISPOSITION:  PACU - hemodynamically stable.   Delay start of Pharmacological VTE agent (>24hrs) due to surgical blood loss or risk of bleeding: yes

## 2015-11-27 NOTE — Anesthesia Postprocedure Evaluation (Signed)
Anesthesia Post Note  Patient: [name]  Procedure(s) Performed: Procedure(s) (LRB): Cervical three-four Cervical four- five Cervical five- six ANTERIOR CERVICAL DECOMPRESSION/DISKECTOMY/FUSION (N/A)  Patient location during evaluation: PACU Anesthesia Type: General Level of consciousness: oriented, patient cooperative and sedated Pain management: pain level controlled Vital Signs Assessment: post-procedure vital signs reviewed and stable Respiratory status: spontaneous breathing, nonlabored ventilation, respiratory function stable and patient connected to nasal cannula oxygen Cardiovascular status: blood pressure returned to baseline and stable Postop Assessment: no signs of nausea or vomiting Anesthetic complications: no    Last Vitals:  Vitals:   11/27/15 1450 11/27/15 1512  BP:  (!) 182/94  Pulse:  (!) 50  Resp:  20  Temp: 36.6 C 36.4 C    Last Pain:  Vitals:   11/27/15 0650  TempSrc: Oral                 Gerson Fauth,E. Librada Castronovo

## 2015-11-28 ENCOUNTER — Inpatient Hospital Stay (HOSPITAL_COMMUNITY): Payer: Medicaid Other | Admitting: Anesthesiology

## 2015-11-28 ENCOUNTER — Encounter (HOSPITAL_COMMUNITY): Payer: Self-pay | Admitting: Anesthesiology

## 2015-11-28 ENCOUNTER — Encounter (HOSPITAL_COMMUNITY)
Admission: RE | Disposition: A | Discharge status: Home / Self Care | Payer: Self-pay | Source: Ambulatory Visit | Attending: Neurosurgery

## 2015-11-28 HISTORY — PX: EVACUATION OF CERVICAL HEMATOMA: SHX6695

## 2015-11-28 SURGERY — EVACUATION OF CERVICAL HEMATOMA
Anesthesia: General

## 2015-11-28 MED ORDER — ONDANSETRON HCL 4 MG/2ML IJ SOLN
4.0000 mg | INTRAMUSCULAR | Status: DC | PRN
  Administered 2015-11-28: 4 mg via INTRAVENOUS
  Filled 2015-11-28: qty 2

## 2015-11-28 MED ORDER — ALUM & MAG HYDROXIDE-SIMETH 200-200-20 MG/5ML PO SUSP: 30.0000 mL | Freq: Four times a day (QID) | ORAL | Status: DC | PRN

## 2015-11-28 MED ORDER — LIDOCAINE HCL (CARDIAC) 20 MG/ML IV SOLN
INTRAVENOUS | Status: DC | PRN
  Administered 2015-11-28: 60 mg via INTRAVENOUS

## 2015-11-28 MED ORDER — PROPOFOL 10 MG/ML IV BOLUS
INTRAVENOUS | Status: AC
  Filled 2015-11-28: qty 20

## 2015-11-28 MED ORDER — HEMOSTATIC AGENTS (NO CHARGE) OPTIME
TOPICAL | Status: DC | PRN
  Administered 2015-11-28: 1 via TOPICAL

## 2015-11-28 MED ORDER — CEFAZOLIN SODIUM-DEXTROSE 2-3 GM-% IV SOLR
INTRAVENOUS | Status: DC | PRN
  Administered 2015-11-28: 2 g via INTRAVENOUS

## 2015-11-28 MED ORDER — PROMETHAZINE HCL 25 MG/ML IJ SOLN: 6.2500 mg | INTRAMUSCULAR | Status: DC | PRN

## 2015-11-28 MED ORDER — PHENOL 1.4 % MT LIQD
1.0000 | OROMUCOSAL | Status: DC | PRN
  Administered 2015-11-28: 1 via OROMUCOSAL
  Filled 2015-11-28: qty 177

## 2015-11-28 MED ORDER — HYDROCODONE-ACETAMINOPHEN 5-325 MG PO TABS: 1.0000 | ORAL_TABLET | ORAL | Status: DC | PRN

## 2015-11-28 MED ORDER — ACETAMINOPHEN 650 MG RE SUPP: 650.0000 mg | RECTAL | Status: DC | PRN

## 2015-11-28 MED ORDER — METHOCARBAMOL 500 MG PO TABS
500.0000 mg | ORAL_TABLET | Freq: Four times a day (QID) | ORAL | Status: DC | PRN
  Administered 2015-11-28 (×2): 500 mg via ORAL
  Filled 2015-11-28 (×2): qty 1

## 2015-11-28 MED ORDER — FENTANYL CITRATE (PF) 100 MCG/2ML IJ SOLN
INTRAMUSCULAR | Status: AC
  Filled 2015-11-28: qty 4

## 2015-11-28 MED ORDER — ACETAMINOPHEN 325 MG PO TABS: 650.0000 mg | ORAL_TABLET | ORAL | Status: DC | PRN

## 2015-11-28 MED ORDER — THROMBIN 5000 UNITS EX SOLR
OROMUCOSAL | Status: DC | PRN
  Administered 2015-11-28: 10:00:00 via TOPICAL

## 2015-11-28 MED ORDER — CEFAZOLIN SODIUM-DEXTROSE 2-4 GM/100ML-% IV SOLN
INTRAVENOUS | Status: AC
  Filled 2015-11-28: qty 100

## 2015-11-28 MED ORDER — THROMBIN 5000 UNITS EX SOLR
CUTANEOUS | Status: AC
  Filled 2015-11-28: qty 5000

## 2015-11-28 MED ORDER — PROPOFOL 10 MG/ML IV BOLUS
INTRAVENOUS | Status: DC | PRN
  Administered 2015-11-28: 120 mg via INTRAVENOUS

## 2015-11-28 MED ORDER — PHENYLEPHRINE 40 MCG/ML (10ML) SYRINGE FOR IV PUSH (FOR BLOOD PRESSURE SUPPORT)
PREFILLED_SYRINGE | INTRAVENOUS | Status: AC
  Filled 2015-11-28: qty 20

## 2015-11-28 MED ORDER — OXYCODONE-ACETAMINOPHEN 5-325 MG PO TABS
1.0000 | ORAL_TABLET | ORAL | Status: DC | PRN
  Filled 2015-11-28: qty 2

## 2015-11-28 MED ORDER — ONDANSETRON HCL 4 MG/2ML IJ SOLN
INTRAMUSCULAR | Status: DC | PRN
  Administered 2015-11-28: 4 mg via INTRAVENOUS

## 2015-11-28 MED ORDER — SODIUM CHLORIDE 0.9% FLUSH: 3.0000 mL | INTRAVENOUS | Status: DC | PRN

## 2015-11-28 MED ORDER — SODIUM CHLORIDE 0.9% FLUSH: 3.0000 mL | Freq: Two times a day (BID) | INTRAVENOUS | Status: DC

## 2015-11-28 MED ORDER — SUCCINYLCHOLINE CHLORIDE 20 MG/ML IJ SOLN
INTRAMUSCULAR | Status: DC | PRN
  Administered 2015-11-28: 100 mg via INTRAVENOUS

## 2015-11-28 MED ORDER — PHENYLEPHRINE HCL 10 MG/ML IJ SOLN
INTRAMUSCULAR | Status: DC | PRN
  Administered 2015-11-28: 120 ug via INTRAVENOUS
  Administered 2015-11-28 (×7): 80 ug via INTRAVENOUS

## 2015-11-28 MED ORDER — HYDROMORPHONE HCL 1 MG/ML IJ SOLN: 0.5000 mg | INTRAMUSCULAR | Status: DC | PRN

## 2015-11-28 MED ORDER — LIDOCAINE-EPINEPHRINE 1 %-1:100000 IJ SOLN
INTRAMUSCULAR | Status: AC
  Filled 2015-11-28: qty 1

## 2015-11-28 MED ORDER — 0.9 % SODIUM CHLORIDE (POUR BTL) OPTIME
TOPICAL | Status: DC | PRN
  Administered 2015-11-28 (×2): 1000 mL

## 2015-11-28 MED ORDER — THROMBIN 5000 UNITS EX SOLR
CUTANEOUS | Status: AC
  Filled 2015-11-28: qty 10000

## 2015-11-28 MED ORDER — CEFAZOLIN IN D5W 1 GM/50ML IV SOLN
1.0000 g | Freq: Three times a day (TID) | INTRAVENOUS | Status: AC
  Administered 2015-11-28 (×2): 1 g via INTRAVENOUS
  Filled 2015-11-28 (×2): qty 50

## 2015-11-28 MED ORDER — HYDROMORPHONE HCL 1 MG/ML IJ SOLN: 0.2500 mg | INTRAMUSCULAR | Status: DC | PRN

## 2015-11-28 MED ORDER — KCL IN DEXTROSE-NACL 20-5-0.45 MEQ/L-%-% IV SOLN: INTRAVENOUS | Status: DC

## 2015-11-28 MED ORDER — SODIUM CHLORIDE 0.9 % IV SOLN: 250.0000 mL | INTRAVENOUS | Status: DC

## 2015-11-28 MED ORDER — DEXAMETHASONE SODIUM PHOSPHATE 10 MG/ML IJ SOLN
INTRAMUSCULAR | Status: DC | PRN
  Administered 2015-11-28: 10 mg via INTRAVENOUS

## 2015-11-28 MED ORDER — BUPIVACAINE HCL (PF) 0.5 % IJ SOLN
INTRAMUSCULAR | Status: AC
  Filled 2015-11-28: qty 30

## 2015-11-28 MED ORDER — LACTATED RINGERS IV SOLN
INTRAVENOUS | Status: DC | PRN
  Administered 2015-11-28: 09:00:00 via INTRAVENOUS

## 2015-11-28 MED ORDER — METHOCARBAMOL 1000 MG/10ML IJ SOLN: 500.0000 mg | Freq: Four times a day (QID) | INTRAVENOUS | Status: DC | PRN

## 2015-11-28 MED ORDER — MENTHOL 3 MG MT LOZG: 1.0000 | LOZENGE | OROMUCOSAL | Status: DC | PRN

## 2015-11-28 MED ORDER — THROMBIN 5000 UNITS EX SOLR
CUTANEOUS | Status: DC | PRN
  Administered 2015-11-28 (×2): 5000 [IU] via TOPICAL

## 2015-11-28 MED ORDER — FAMOTIDINE 20 MG PO TABS
20.0000 mg | ORAL_TABLET | Freq: Two times a day (BID) | ORAL | Status: DC
  Administered 2015-11-28: 20 mg via ORAL
  Filled 2015-11-28: qty 1

## 2015-11-28 MED ORDER — FAMOTIDINE IN NACL 20-0.9 MG/50ML-% IV SOLN
20.0000 mg | Freq: Two times a day (BID) | INTRAVENOUS | Status: DC
  Administered 2015-11-28: 20 mg via INTRAVENOUS
  Filled 2015-11-28: qty 50

## 2015-11-28 MED ORDER — MIDAZOLAM HCL 2 MG/2ML IJ SOLN
INTRAMUSCULAR | Status: AC
  Filled 2015-11-28: qty 2

## 2015-11-28 SURGICAL SUPPLY — 40 items
CANISTER SUCT 3000ML PPV (MISCELLANEOUS) ×3 IMPLANT
COVER MAYO STAND STRL (DRAPES) ×3 IMPLANT
DERMABOND ADVANCED (GAUZE/BANDAGES/DRESSINGS) ×2
DERMABOND ADVANCED .7 DNX12 (GAUZE/BANDAGES/DRESSINGS) ×1 IMPLANT
DRAIN HEMOVAC 1/8 X 5 (WOUND CARE) ×3 IMPLANT
DRAPE LAPAROTOMY 100X72 PEDS (DRAPES) ×3 IMPLANT
DRAPE POUCH INSTRU U-SHP 10X18 (DRAPES) ×3 IMPLANT
DRSG OPSITE POSTOP 3X4 (GAUZE/BANDAGES/DRESSINGS) ×3 IMPLANT
DURAPREP 6ML APPLICATOR 50/CS (WOUND CARE) ×3 IMPLANT
ELECT COATED BLADE 2.86 ST (ELECTRODE) ×3 IMPLANT
ELECT REM PT RETURN 9FT ADLT (ELECTROSURGICAL) ×3
ELECTRODE REM PT RTRN 9FT ADLT (ELECTROSURGICAL) ×1 IMPLANT
EVACUATOR SILICONE 100CC (DRAIN) ×3 IMPLANT
GLOVE BIO SURGEON STRL SZ8 (GLOVE) ×6 IMPLANT
GLOVE BIOGEL PI IND STRL 7.5 (GLOVE) ×2 IMPLANT
GLOVE BIOGEL PI IND STRL 8 (GLOVE) ×2 IMPLANT
GLOVE BIOGEL PI IND STRL 8.5 (GLOVE) ×2 IMPLANT
GLOVE BIOGEL PI INDICATOR 7.5 (GLOVE) ×4
GLOVE BIOGEL PI INDICATOR 8 (GLOVE) ×4
GLOVE BIOGEL PI INDICATOR 8.5 (GLOVE) ×4
GLOVE ECLIPSE 7.0 STRL STRAW (GLOVE) ×3 IMPLANT
GLOVE ECLIPSE 8.0 STRL XLNG CF (GLOVE) ×6 IMPLANT
GOWN STRL REUS W/ TWL LRG LVL3 (GOWN DISPOSABLE) ×2 IMPLANT
GOWN STRL REUS W/ TWL XL LVL3 (GOWN DISPOSABLE) ×1 IMPLANT
GOWN STRL REUS W/TWL 2XL LVL3 (GOWN DISPOSABLE) ×3 IMPLANT
GOWN STRL REUS W/TWL LRG LVL3 (GOWN DISPOSABLE) ×4
GOWN STRL REUS W/TWL XL LVL3 (GOWN DISPOSABLE) ×2
HALTER HD/CHIN CERV TRACTION D (MISCELLANEOUS) ×3 IMPLANT
KIT BASIN OR (CUSTOM PROCEDURE TRAY) ×3 IMPLANT
KIT ROOM TURNOVER OR (KITS) ×3 IMPLANT
NEEDLE HYPO 25X1 1.5 SAFETY (NEEDLE) ×3 IMPLANT
NS IRRIG 1000ML POUR BTL (IV SOLUTION) ×6 IMPLANT
PACK LAMINECTOMY NEURO (CUSTOM PROCEDURE TRAY) ×3 IMPLANT
PAD ARMBOARD 7.5X6 YLW CONV (MISCELLANEOUS) ×9 IMPLANT
SPONGE INTESTINAL PEANUT (DISPOSABLE) ×3 IMPLANT
SPONGE SURGIFOAM ABS GEL SZ50 (HEMOSTASIS) ×3 IMPLANT
SUT VIC AB 3-0 SH 8-18 (SUTURE) ×3 IMPLANT
TOWEL OR 17X24 6PK STRL BLUE (TOWEL DISPOSABLE) ×3 IMPLANT
TOWEL OR 17X26 10 PK STRL BLUE (TOWEL DISPOSABLE) ×3 IMPLANT
WATER STERILE IRR 1000ML POUR (IV SOLUTION) ×3 IMPLANT

## 2015-11-28 NOTE — Evaluation (Signed)
Physical Therapy Evaluation Patient Details Name: [name] MRN: [id number] DOB: 17-Jun-1955 Today's Date: 11/28/2015   History of Present Illness  Pt is a 60 y/o male who presents s/p C3-C6 ACDF on 11/27/15. Pt went back to OR for evacuation of hematoma on 11/28/15.  Clinical Impression  Pt admitted with above diagnosis. Pt currently with functional limitations due to the deficits listed below (see PT Problem List). At the time of PT eval pt was able to perform transfers and ambulation with modified independence to occasional min assist for balance support. Pt would benefit from [facility] next session. Pt will benefit from skilled PT to increase their independence and safety with mobility to allow discharge to the venue listed below.       Follow Up Recommendations No PT follow up;Supervision for mobility/OOB    Equipment Recommendations  3in1 (PT);Cane    Recommendations for Other Services       Precautions / Restrictions Precautions Precautions: Fall;Cervical Precaution Comments: Precaution handout provided and discussed with pt and family member Required Braces or Orthoses: Cervical Brace Cervical Brace: Hard collar;At all times Restrictions Weight Bearing Restrictions: No      Mobility  Bed Mobility Overal bed mobility: Modified Independent             General bed mobility comments: HOB elevated. No assist required.   Transfers Overall transfer level: Modified independent Equipment used: None             General transfer comment: No unsteadiness noted. No assist required. Pt states he feels dizzy upon first stand but that it passed within 30 seconds.   Ambulation/Gait Ambulation/Gait assistance: Min assist Ambulation Distance (Feet): 200 Feet Assistive device: 1 person hand held assist Gait Pattern/deviations: Step-through pattern;Decreased stride length Gait velocity: Decreased Gait velocity interpretation: Below normal speed for age/gender General Gait  Details: HHA provided for balance support and safety. Pt able to walk without assistance however was very guarded and slow.   Stairs            Wheelchair Mobility    Modified Rankin (Stroke Patients Only)       Balance Overall balance assessment: Needs assistance Sitting-balance support: Feet supported;No upper extremity supported Sitting balance-Leahy Scale: Good     Standing balance support: No upper extremity supported;During functional activity Standing balance-Leahy Scale: Fair                               Pertinent Vitals/Pain Pain Assessment: Faces Faces Pain Scale: Hurts even more Pain Location: Incision site Pain Descriptors / Indicators: Operative site guarding Pain Intervention(s): Limited activity within patient's tolerance;Monitored during session;Repositioned;Patient requesting pain meds-RN notified    Home Living Family/patient expects to be discharged to:: Private residence Living Arrangements: Alone Available Help at Discharge: Family;Available 24 hours/day (initially) Type of Home: Apartment Home Access: Level entry;Elevator     Home Layout: One level Home Equipment: None      Prior Function Level of Independence: Independent               Hand Dominance        Extremity/Trunk Assessment   Upper Extremity Assessment: Defer to OT evaluation           Lower Extremity Assessment: Overall WFL for tasks assessed      Cervical / Trunk Assessment: Other exceptions (s/p surgery)  Communication   Communication: No difficulties  Cognition Arousal/Alertness: Awake/alert Behavior During Therapy: [facility] for  tasks assessed/performed Overall Cognitive Status: Within Functional Limits for tasks assessed                      General Comments      Exercises     Assessment/Plan    PT Assessment Patient needs continued PT services  PT Problem List Decreased strength;Decreased range of motion;Decreased activity  tolerance;Decreased balance;Decreased mobility;Decreased knowledge of use of DME;Decreased safety awareness;Decreased knowledge of precautions;Pain          PT Treatment Interventions DME instruction;Gait training;Stair training;Functional mobility training;Therapeutic activities;Therapeutic exercise;Neuromuscular re-education;Patient/family education    PT Goals (Current goals can be found in the Care Plan section)  Acute Rehab PT Goals Patient Stated Goal: Get better PT Goal Formulation: With patient/family Time For Goal Achievement: 12/05/15 Potential to Achieve Goals: Good    Frequency Min 5X/week   Barriers to discharge        Co-evaluation               End of Session Equipment Utilized During Treatment: Cervical collar Activity Tolerance: Patient tolerated treatment well Patient left: with call bell/phone within reach;in bed;with family/visitor present (Sitting EOB) Nurse Communication: Mobility status         Time: 1417-1440 PT Time Calculation (min) (ACUTE ONLY): 23 min   Charges:   PT Evaluation $PT Eval Moderate Complexity: 1 Procedure PT Treatments $Gait Training: 8-22 mins   PT G Codes:        Rolinda Roan 2015-12-22, 2:50 PM  Rolinda Roan, PT, DPT Acute Rehabilitation Services Pager: [phone]

## 2015-11-28 NOTE — Brief Op Note (Signed)
11/27/2015 - 11/28/2015  10:04 AM  PATIENT:  [name]  60 y.o. male  PRE-OPERATIVE DIAGNOSIS:  Cervical Hematoma  POST-OPERATIVE DIAGNOSIS:  Cervical Hematoma  PROCEDURE:  Procedure(s): EVACUATION OF CERVICAL HEMATOMA (N/A)  SURGEON:  Surgeon(s) and Role:    * Erline Levine, MD - Primary  PHYSICIAN ASSISTANT:   ASSISTANTS: Poteat, RN   ANESTHESIA:   general  EBL:  Total I/O In: -  Out: 70 [Blood:70]  BLOOD ADMINISTERED:none  DRAINS: (10) Jackson-Pratt drain(s) with closed bulb suction in the prevertebral space   LOCAL MEDICATIONS USED:  NONE  SPECIMEN:  No Specimen  DISPOSITION OF SPECIMEN:  N/A  COUNTS:  YES  TOURNIQUET:  * No tourniquets in log *  DICTATION:DICTATION: Patient was brought to operating room and following the smooth and uncomplicated induction of general endotracheal anesthesia his head was placed on a horseshoe head holder he was placed in 5 pounds of Holter traction and his anterior neck was prepped and draped in usual sterile fashion. An incision was reopened made on the left side of midline  The platysmal layer was incised and subplatysmal dissection was performed exposing the anterior border sternocleidomastoid muscle. Using blunt dissection the carotid sheath was kept lateral and trachea and esophagus kept medial exposing the anterior cervical spine exposing clotted blood from the prevertebral space.  The wound was explored and hematoma evacuated.The wound was irrigated and Surgifoam was used.  There was a small site of venous bleeding, which was controlled with bipolar electrocautery.  Soft tissues were inspected and found to be in good repair. The wound was irrigated. A #10 JP drain was inserted through a separate stab incision.  The platysma layer was closed with 3-0 Vicryl stitches and the skin was reapproximated with 3-0 Vicryl subcuticular stitches. The wound was dressed with Dermabond and an occlusive dressing. Counts were correct at the end  of the case. Patient was extubated and taken to recovery in stable and satisfactory condition.    PLAN OF CARE: Admit to inpatient   PATIENT DISPOSITION:  PACU - hemodynamically stable.   Delay start of Pharmacological VTE agent (>24hrs) due to surgical blood loss or risk of bleeding: yes

## 2015-11-28 NOTE — Progress Notes (Addendum)
Subjective: Patient reports "I can't swallow real good"  Objective: Vital signs in last 24 hours: Temp:  [97.5 F (36.4 C)-98.9 F (37.2 C)] 98.7 F (37.1 C) (10/25 0745) Pulse Rate:  [48-73] 66 (10/25 0745) Resp:  [9-20] 18 (10/25 0745) BP: (133-182)/(76-98) 147/85 (10/25 0745) SpO2:  [80 %-97 %] 90 % (10/25 0745)  Intake/Output from previous day: 10/24 0701 - 10/25 0700 In: 4818 [P.O.:240; I.V.:1425; IV Piggyback:100] Out: 50 [Blood:50] Intake/Output this shift: No intake/output data recorded.  Alert, conversant. MAEW. Good strength BUE. Pt reports some pain in the anterior neck. Removal of Vista collar reveals swelling beneath and lateral to the incision. No drainage or erythema. Some difficulty managing secretions noted.  Noted one episode of vomiting this morning, otherwise no difficulty through the night.   Lab Results: No results for input(s): WBC, HGB, HCT, PLT in the last 72 hours. BMET No results for input(s): NA, K, CL, CO2, GLUCOSE, BUN, CREATININE, CALCIUM in the last 72 hours.  Studies/Results: Dg Cervical Spine 2-3 Views  Result Date: 11/27/2015 CLINICAL DATA:  Anterior decompression and discectomy at C3-4 through C5-6. EXAM: CERVICAL SPINE - 2-3 VIEW COMPARISON:  10/31/2015 outside films.  MRI of 10/06/2015. FINDINGS: 2 lateral views. The first is labeled 10:30 a.m. This demonstrates surgical devices projecting at C3-4 and C4-5 interspaces. The second image is labeled 1216 hours. This demonstrates anterior plate and screw fixation device at C3-6. The C6 level is incompletely imaged. No hardware complication identified. Anterior sponge and endotracheal tube. IMPRESSION: Intraoperative imaging of anterior fixation at C3-6. Electronically Signed   By: Abigail Miyamoto M.D.   On: 11/27/2015 13:27    Assessment/Plan:   LOS: 1 day  Will hold on planned discharge. DrStern will reassess shortly.    Verdis Prime 11/28/2015, 7:47 AM  Patient had nausea and vomiting and  subsequently developed neck swelling.  Patient appears to have hematoma at surgical site.  I have recommended to patient that we return to OR and explore his neck for hematoma.  He is NPO.

## 2015-11-28 NOTE — Transfer of Care (Signed)
Immediate Anesthesia Transfer of Care Note  Patient: [name]  Procedure(s) Performed: Procedure(s): EVACUATION OF CERVICAL HEMATOMA (N/A)  Patient Location: PACU  Anesthesia Type:General  Level of Consciousness: awake, alert , oriented and patient cooperative  Airway & Oxygen Therapy: Patient Spontanous Breathing and Patient connected to nasal cannula oxygen  Post-op Assessment: Report given to RN and Post -op Vital signs reviewed and stable  Post vital signs: Reviewed and stable  Last Vitals:  Vitals:   11/28/15 0745 11/28/15 1015  BP: (!) 147/85   Pulse: 66 (!) 55  Resp: 18 11  Temp: 37.1 C 36.4 C    Last Pain:  Vitals:   11/28/15 0745  TempSrc: Oral  PainSc:       Patients Stated Pain Goal: 3 (08/03/14 0109)  Complications: No apparent anesthesia complications

## 2015-11-28 NOTE — Progress Notes (Signed)
OT Cancellation Note  Patient Details Name: [name] MRN: [id number] DOB: [birth year]   Cancelled Treatment:    Reason Eval/Treat Not Completed: Patient at procedure or test/ unavailable (returning to surg)  Vonita Moss   OTR/L Pager: [phone] Office: [phone] .  11/28/2015, 8:08 AM

## 2015-11-28 NOTE — Anesthesia Postprocedure Evaluation (Signed)
Anesthesia Post Note  Patient: [name]  Procedure(s) Performed: Procedure(s) (LRB): EVACUATION OF CERVICAL HEMATOMA (N/A)  Patient location during evaluation: PACU Anesthesia Type: General Level of consciousness: awake and alert Pain management: pain level controlled Vital Signs Assessment: post-procedure vital signs reviewed and stable Respiratory status: spontaneous breathing, nonlabored ventilation, respiratory function stable and patient connected to nasal cannula oxygen Cardiovascular status: blood pressure returned to baseline and stable Postop Assessment: no signs of nausea or vomiting Anesthetic complications: no    Last Vitals:  Vitals:   11/28/15 1040 11/28/15 1115  BP:  (!) 135/92  Pulse: (!) 48 (!) 41  Resp: 12 18  Temp:      Last Pain:  Vitals:   11/28/15 1040  TempSrc:   PainSc: 2                  Malick Netz S

## 2015-11-28 NOTE — Anesthesia Preprocedure Evaluation (Signed)
Anesthesia Evaluation  Patient identified by MRN, date of birth, ID band Patient awake    Reviewed: Allergy & Precautions, NPO status , Patient's Chart, lab work & pertinent test results  Airway Mallampati: III  TM Distance: <3 FB Neck ROM: Limited    Dental no notable dental hx.    Pulmonary COPD, Current Smoker,    Pulmonary exam normal breath sounds clear to auscultation       Cardiovascular Normal cardiovascular exam Rhythm:Regular Rate:Normal     Neuro/Psych negative neurological ROS  negative psych ROS   GI/Hepatic negative GI ROS, Neg liver ROS,   Endo/Other  negative endocrine ROS  Renal/GU negative Renal ROS  negative genitourinary   Musculoskeletal negative musculoskeletal ROS (+)   Abdominal   Peds negative pediatric ROS (+)  Hematology negative hematology ROS (+)   Anesthesia Other Findings   Reproductive/Obstetrics negative OB ROS                             Anesthesia Physical Anesthesia Plan  ASA: III and emergent  Anesthesia Plan: General   Post-op Pain Management:    Induction: Intravenous and Rapid sequence  Airway Management Planned: Oral ETT and Video Laryngoscope Planned  Additional Equipment:   Intra-op Plan:   Post-operative Plan: Extubation in OR  Informed Consent: I have reviewed the patients History and Physical, chart, labs and discussed the procedure including the risks, benefits and alternatives for the proposed anesthesia with the patient or authorized representative who has indicated his/her understanding and acceptance.   Dental advisory given  Plan Discussed with: CRNA and Surgeon  Anesthesia Plan Comments: (Pt s/p ACDF yesterday. Grade 1 view with glidescope on induction then. Will have surgeon on standby for surgical airway if severely compromised on DL)        Anesthesia Quick Evaluation

## 2015-11-28 NOTE — Op Note (Signed)
11/27/2015 - 11/28/2015  10:04 AM  PATIENT:  [name]  60 y.o. male  PRE-OPERATIVE DIAGNOSIS:  Cervical Hematoma  POST-OPERATIVE DIAGNOSIS:  Cervical Hematoma  PROCEDURE:  Procedure(s): EVACUATION OF CERVICAL HEMATOMA (N/A)  SURGEON:  Surgeon(s) and Role:    * Erline Levine, MD - Primary  PHYSICIAN ASSISTANT:   ASSISTANTS: Poteat, RN   ANESTHESIA:   general  EBL:  Total I/O In: -  Out: 70 [Blood:70]  BLOOD ADMINISTERED:none  DRAINS: (10) Jackson-Pratt drain(s) with closed bulb suction in the prevertebral space   LOCAL MEDICATIONS USED:  NONE  SPECIMEN:  No Specimen  DISPOSITION OF SPECIMEN:  N/A  COUNTS:  YES  TOURNIQUET:  * No tourniquets in log *  DICTATION:DICTATION: Patient was brought to operating room and following the smooth and uncomplicated induction of general endotracheal anesthesia his head was placed on a horseshoe head holder he was placed in 5 pounds of Holter traction and his anterior neck was prepped and draped in usual sterile fashion. An incision was reopened made on the left side of midline  The platysmal layer was incised and subplatysmal dissection was performed exposing the anterior border sternocleidomastoid muscle. Using blunt dissection the carotid sheath was kept lateral and trachea and esophagus kept medial exposing the anterior cervical spine exposing clotted blood from the prevertebral space.  The wound was explored and hematoma evacuated.The wound was irrigated and Surgifoam was used.  There was a small site of venous bleeding, which was controlled with bipolar electrocautery.  Soft tissues were inspected and found to be in good repair. The wound was irrigated. A #10 JP drain was inserted through a separate stab incision.  The platysma layer was closed with 3-0 Vicryl stitches and the skin was reapproximated with 3-0 Vicryl subcuticular stitches. The wound was dressed with Dermabond and an occlusive dressing. Counts were correct at the end  of the case. Patient was extubated and taken to recovery in stable and satisfactory condition.    PLAN OF CARE: Admit to inpatient   PATIENT DISPOSITION:  PACU - hemodynamically stable.   Delay start of Pharmacological VTE agent (>24hrs) due to surgical blood loss or risk of bleeding: yes

## 2015-11-29 ENCOUNTER — Encounter (HOSPITAL_COMMUNITY): Payer: Self-pay | Admitting: Neurosurgery

## 2015-11-29 MED ORDER — OXYCODONE-ACETAMINOPHEN 5-325 MG PO TABS: 1.0000 | ORAL_TABLET | ORAL | 0 refills | Status: DC | PRN

## 2015-11-29 MED ORDER — METHOCARBAMOL 500 MG PO TABS: 500.0000 mg | ORAL_TABLET | Freq: Four times a day (QID) | ORAL | 1 refills | Status: DC | PRN

## 2015-11-29 NOTE — Progress Notes (Signed)
Subjective: Patient reports "I'm good"  Objective: Vital signs in last 24 hours: Temp:  [97.5 F (36.4 C)-99 F (37.2 C)] 98.4 F (36.9 C) (10/26 0400) Pulse Rate:  [41-115] 70 (10/26 0400) Resp:  [9-20] 18 (10/26 0400) BP: (135-164)/(74-93) 160/87 (10/26 0400) SpO2:  [90 %-100 %] 95 % (10/26 0400)  Intake/Output from previous day: 10/25 0701 - 10/26 0700 In: 1520 [P.O.:720; I.V.:700; IV Piggyback:100] Out: 110 [Drains:40; Blood:70] Intake/Output this shift: No intake/output data recorded.  Alert, conversant. Full strength BUE. Incision flat, nontender, without erythema, swelling, or drainage. JP 43m last 12 hrs. No dysphagia reported this morning.   Lab Results: No results for input(s): WBC, HGB, HCT, PLT in the last 72 hours. BMET No results for input(s): NA, K, CL, CO2, GLUCOSE, BUN, CREATININE, CALCIUM in the last 72 hours.  Studies/Results: Dg Cervical Spine 2-3 Views  Result Date: 11/27/2015 CLINICAL DATA:  Anterior decompression and discectomy at C3-4 through C5-6. EXAM: CERVICAL SPINE - 2-3 VIEW COMPARISON:  10/31/2015 outside films.  MRI of 10/06/2015. FINDINGS: 2 lateral views. The first is labeled 10:30 a.m. This demonstrates surgical devices projecting at C3-4 and C4-5 interspaces. The second image is labeled 1216 hours. This demonstrates anterior plate and screw fixation device at C3-6. The C6 level is incompletely imaged. No hardware complication identified. Anterior sponge and endotracheal tube. IMPRESSION: Intraoperative imaging of anterior fixation at C3-6. Electronically Signed   By: KAbigail MiyamotoM.D.   On: 11/27/2015 13:27    Assessment/Plan: Improved  LOS: 2 days  Per DrStern, d/c JP, d/c IV, d/c to home. Pt verbalizes understanding of d/c instructions and already has f/u appt scheduled. Counselled re: smoking cessation, he states his intention is to not resume. Rx's for Percocet & Robaxin for home use.    PVerdis Prime10/26/2017, 7:33 AM

## 2015-11-29 NOTE — Progress Notes (Signed)
Physical Therapy Treatment Patient Details Name: [name] MRN: [id number] DOB: 08-Apr-1955 Today's Date: 11/29/2015    History of Present Illness Pt is a 60 y/o male who presents s/p C3-C6 ACDF on 11/27/15. Pt went back to OR for evacuation of hematoma on 11/28/15.    PT Comments    Pt progressing towards physical therapy goals. Pt and family member were educated on precautions as well as brace application and wearing schedule. Pt anticipates d/c home today. Will continue to follow.   Follow Up Recommendations  No PT follow up;Supervision for mobility/OOB     Equipment Recommendations  3in1 (PT);Cane    Recommendations for Other Services       Precautions / Restrictions Precautions Precautions: Fall;Cervical Precaution Comments: Precaution handout reviewed and discussed with pt and family member Required Braces or Orthoses: Cervical Brace Cervical Brace: Hard collar;At all times Restrictions Weight Bearing Restrictions: No    Mobility  Bed Mobility Overal bed mobility: Modified Independent             General bed mobility comments: HOB elevated. No assist required.   Transfers Overall transfer level: Modified independent Equipment used: None             General transfer comment: No unsteadiness noted. No assist required.   Ambulation/Gait Ambulation/Gait assistance: Supervision Ambulation Distance (Feet): 300 Feet Assistive device: Straight cane Gait Pattern/deviations: Step-through pattern;Decreased stride length Gait velocity: Decreased Gait velocity interpretation: Below normal speed for age/gender General Gait Details: Supervision for safety. Pt was cued for sequencing and safety with the SPC.    Stairs Stairs: Yes Stairs assistance: Min guard Stair Management: No rails;Step to pattern;Forwards;With cane Number of Stairs: 2 (1 step x2) General stair comments: VC's for sequencing and safety. For curb training.   Wheelchair Mobility     Modified Rankin (Stroke Patients Only)       Balance Overall balance assessment: Needs assistance Sitting-balance support: Feet supported;No upper extremity supported Sitting balance-Leahy Scale: Good     Standing balance support: No upper extremity supported Standing balance-Leahy Scale: Fair                      Cognition Arousal/Alertness: Awake/alert Behavior During Therapy: WFL for tasks assessed/performed Overall Cognitive Status: Within Functional Limits for tasks assessed                      Exercises      General Comments        Pertinent Vitals/Pain Pain Assessment: Faces Faces Pain Scale: Hurts a little bit Pain Location: Incision site Pain Descriptors / Indicators: Operative site guarding Pain Intervention(s): Limited activity within patient's tolerance;Monitored during session;Repositioned    Home Living                      Prior Function            PT Goals (current goals can now be found in the care plan section) Acute Rehab PT Goals Patient Stated Goal: Get better PT Goal Formulation: With patient/family Time For Goal Achievement: 12/05/15 Potential to Achieve Goals: Good Progress towards PT goals: Progressing toward goals    Frequency    Min 5X/week      PT Plan Current plan remains appropriate    Co-evaluation             End of Session Equipment Utilized During Treatment: Cervical collar Activity Tolerance: Patient tolerated treatment well Patient left: in chair;with  call bell/phone within reach;with family/visitor present     Time: 2481-8590 PT Time Calculation (min) (ACUTE ONLY): 11 min  Charges:  $Gait Training: 8-22 mins                    G Codes:      Rolinda Roan Dec 06, 2015, 8:47 AM   Rolinda Roan, PT, DPT Acute Rehabilitation Services Pager: [phone]

## 2015-11-29 NOTE — Discharge Summary (Addendum)
Physician Discharge Summary  Patient ID: [name] MRN: [id number] DOB/AGE: March 26, 1955 60 y.o.  Admit date: 11/27/2015 Discharge date: 11/29/2015  Admission Diagnoses:CERVICAL HERNIATED [place], cervical stenosis, cervical radiculopathy C 34, C 45, C 56 levels   Discharge Diagnoses: CERVICAL HERNIATED DISC WITH MYELOPATHY, cervical stenosis, cervical radiculopathy C 34, C 45, C 56 levels; Cervical Hematoma; s/p Cervical three-four Cervical four- five Cervical five- six ANTERIOR CERVICAL DECOMPRESSION/DISKECTOMY/FUSION (N/A) with PEEK cages, autograft, DBM, anterior cervical plate and EVACUATION OF CERVICAL HEMATOMA      Active Problems:   Cervical myelopathy ([place])   Discharged Condition: good  Hospital Course: [name] was admitted for surgery with dx HNP with myelopathy. Following uncomplicated ACDF, he recovered well and transferred to [facility] for observation. Post-op day 1, pt noted some dysphagia and exam revealed some swelling. He was taken back to OR for evacuation of hematoma. Surgery was without complications and he recovered nicely. JP drain demonstrated decreasing drainage next 24hrs & was pulled. He is mobilizing well.   Consults: None  Significant Diagnostic Studies: radiology: X-Ray: intra-op  Treatments: surgery: Cervical three-four Cervical four- five Cervical five- six ANTERIOR CERVICAL DECOMPRESSION/DISKECTOMY/FUSION  with PEEK cages, autograft, DBM, anterior cervical plate EVACUATION OF CERVICAL HEMATOMA    Discharge Exam: Blood pressure (!) 160/87, pulse 70, temperature 98.4 F (36.9 C), temperature source Oral, resp. rate 18, weight 65.8 kg (145 lb), SpO2 95 %. Alert, conversant. Full strength BUE. Incision flat, nontender, without erythema, swelling, or drainage. JP 20m last 12 hrs. No dysphagia reported this morning.    Disposition: 01-Home or Self Care  Pt verbalizes understanding of d/c instructions and already has f/u appt scheduled.  Counselled re: smoking cessation, he states his intention is to not resume. Rx's for Percocet & Robaxin for home use.       Medication List    TAKE these medications   albuterol 108 (90 Base) MCG/ACT inhaler Commonly known as:  PROVENTIL HFA;VENTOLIN HFA Inhale 2 puffs into the lungs every 6 (six) hours as needed for wheezing or shortness of breath.   aspirin EC 81 MG tablet Take 81 mg by mouth at bedtime.   gabapentin 300 MG capsule Commonly known as:  NEURONTIN Take 1 capsule (300 mg total) by mouth 3 (three) times daily. What changed:  when to take this   ibuprofen 800 MG tablet Commonly known as:  ADVIL,MOTRIN Take 800 mg by mouth every 2 (two) hours as needed (pain).   methocarbamol 500 MG tablet Commonly known as:  ROBAXIN Take 1 tablet (500 mg total) by mouth every 6 (six) hours as needed for muscle spasms.   omeprazole 20 MG capsule Commonly known as:  PRILOSEC Take 20 mg by mouth daily.   oxyCODONE-acetaminophen 5-325 MG tablet Commonly known as:  PERCOCET/ROXICET Take 1-2 tablets by mouth every 4 (four) hours as needed for moderate pain.   OXYGEN Inhale 2 L into the lungs at bedtime.   tiotropium 18 MCG inhalation capsule Commonly known as:  SPIRIVA Place 1 capsule (18 mcg total) into inhaler and inhale daily.        Signed: PVerdis Prime10/26/2017, 7:38 AM  Patient is doing well, neck is soft and swallowing is no longer uncomfortable.  Patient will be D/C ed home and knows not to smoke.

## 2015-11-29 NOTE — Discharge Instructions (Signed)
Wound Care °Leave incision open to air. °You may shower. °Do not scrub directly on incision.  °Do not put any creams, lotions, or ointments on incision. °Activity °Walk each and every day, increasing distance each day. °No lifting greater than 5 lbs.  Avoid excessive neck motion. °No driving for 2 weeks; may ride as a passenger locally. °Wear neck brace at all times except when showering.  If provided soft collar, may wear for comfort unless otherwise instructed. °Diet °Resume your normal diet.  °Return to Work °Will be discussed at you follow up appointment. °Call Your Doctor If Any of These Occur °Redness, drainage, or swelling at the wound.  °Temperature greater than 101 degrees. °Severe pain not relieved by pain medication. °Increased difficulty swallowing. °Incision starts to come apart. °Follow Up Appt °Call today for appointment in 3-4 weeks ([phone]) or for problems.  If you have any hardware placed in your spine, you will need an x-ray before your appointment. °

## 2015-11-29 NOTE — Progress Notes (Signed)
Pt doing well. Pt and family given D/C instructions with Rx's, verbal understanding was provided. Pt's incision is clean and dry with no sign of infection. Pt's IV was removed prior to D/C. Pt D/C'd home via wheelchair @ 0830 per MD order. Pt is stable @ D/C and has no other needs at this time. Holli Humbles, RN

## 2015-12-05 ENCOUNTER — Ambulatory Visit: Payer: Medicaid Other | Admitting: Surgery

## 2015-12-05 ENCOUNTER — Other Ambulatory Visit: Payer: Medicaid Other

## 2016-01-04 ENCOUNTER — Ambulatory Visit (INDEPENDENT_AMBULATORY_CARE_PROVIDER_SITE_OTHER): Payer: Medicaid Other | Admitting: Emergency Medicine

## 2016-01-04 ENCOUNTER — Encounter: Payer: Self-pay | Admitting: Emergency Medicine

## 2016-01-04 DIAGNOSIS — R938 Abnormal findings on diagnostic imaging of other specified body structures: Secondary | ICD-10-CM

## 2016-01-04 DIAGNOSIS — R9389 Abnormal findings on diagnostic imaging of other specified body structures: Secondary | ICD-10-CM

## 2016-01-04 MED ORDER — GUAIFENESIN ER 600 MG PO TB12: 600.0000 mg | ORAL_TABLET | Freq: Two times a day (BID) | ORAL | 0 refills | Status: DC

## 2016-01-04 NOTE — Assessment & Plan Note (Signed)
Continue spiriva + albuterol Add guaifenesin  Smoking cessation

## 2016-01-04 NOTE — Assessment & Plan Note (Signed)
Spiculated RUL nodule, larger on repeat Ct 10/'17, surrounded by emphysema. Likely too small for PET to be useful at this time. Difficult navigation case although could try to do so - would allow me to place fiducial markers. I will not plan this until reviewing case w Dr Cyndia Bent. Best plan may be to defer procedure for now and follow Ct in several months for interval change. He see's Dr Cyndia Bent this month.

## 2016-01-04 NOTE — Patient Instructions (Addendum)
Please start guaifenesin '600mg'$  twice a day to see if this helps with mucous clearance.  Continue Spiriva once a day  Take albuterol 2 puffs up to every 4 hours if needed for shortness of breath.  Follow with Dr Cyndia Bent 12/27 as planned. We will communicate with Dr Cyndia Bent to discuss best strategy to work up your right lung nodule.  Follow with Dr Lamonte Sakai in 4 months or sooner if you have any problems.

## 2016-01-04 NOTE — Assessment & Plan Note (Signed)
S/p [name]  °
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
## 2016-01-14 ENCOUNTER — Emergency Department (HOSPITAL_COMMUNITY)
Admission: EM | Admit: 2016-01-14 | Discharge: 2016-01-14 | Disposition: A | Discharge status: Home / Self Care | Payer: Medicaid Other | Attending: Emergency Medicine | Admitting: Emergency Medicine

## 2016-01-14 ENCOUNTER — Encounter (HOSPITAL_COMMUNITY): Payer: Self-pay | Admitting: *Deleted

## 2016-01-14 ENCOUNTER — Emergency Department (HOSPITAL_COMMUNITY): Payer: Medicaid Other

## 2016-01-14 DIAGNOSIS — M25511 Pain in right shoulder: Secondary | ICD-10-CM | POA: Diagnosis present

## 2016-01-14 DIAGNOSIS — F17210 Nicotine dependence, cigarettes, uncomplicated: Secondary | ICD-10-CM | POA: Diagnosis not present

## 2016-01-14 DIAGNOSIS — Z7982 Long term (current) use of aspirin: Secondary | ICD-10-CM | POA: Diagnosis not present

## 2016-01-14 DIAGNOSIS — Z79899 Other long term (current) drug therapy: Secondary | ICD-10-CM | POA: Insufficient documentation

## 2016-01-14 DIAGNOSIS — J449 Chronic obstructive pulmonary disease, unspecified: Secondary | ICD-10-CM | POA: Insufficient documentation

## 2016-01-14 DIAGNOSIS — I2510 Atherosclerotic heart disease of native coronary artery without angina pectoris: Secondary | ICD-10-CM | POA: Insufficient documentation

## 2016-01-14 DIAGNOSIS — Z85118 Personal history of other malignant neoplasm of bronchus and lung: Secondary | ICD-10-CM | POA: Diagnosis not present

## 2016-01-14 LAB — BASIC METABOLIC PANEL
Anion gap: 6 (ref 5–15)
BUN: 7 mg/dL (ref 6–20)
CO2: 28 mmol/L (ref 22–32)
Calcium: 9.3 mg/dL (ref 8.9–10.3)
Chloride: 105 mmol/L (ref 101–111)
Creatinine, Ser: 0.94 mg/dL (ref 0.61–1.24)
GFR calc Af Amer: >60 mL/min (ref >60)
GFR calc non Af Amer: >60 mL/min (ref >60)
Glucose, Bld: 98 mg/dL (ref 65–99)
Potassium: 4.2 mmol/L (ref 3.5–5.1)
Sodium: 139 mmol/L (ref 135–145)

## 2016-01-14 LAB — CBC
HCT: 39.1 % (ref 39.0–52.0)
Hemoglobin: 12.9 g/dL — ABNORMAL LOW (ref 13.0–17.0)
MCH: 30.6 pg (ref 26.0–34.0)
MCHC: 33 g/dL (ref 30.0–36.0)
MCV: 92.7 fL (ref 78.0–100.0)
Platelets: 285 K/uL (ref 150–400)
RBC: 4.22 MIL/uL (ref 4.22–5.81)
RDW: 15.2 % (ref 11.5–15.5)
WBC: 5.9 K/uL (ref 4.0–10.5)

## 2016-01-14 LAB — I-STAT TROPONIN, ED: Troponin i, poc: 0.01 ng/mL (ref 0.00–0.08)

## 2016-01-14 MED ORDER — OXYCODONE-ACETAMINOPHEN 5-325 MG PO TABS
ORAL_TABLET | ORAL | Status: AC
  Filled 2016-01-14: qty 1

## 2016-01-14 MED ORDER — OXYCODONE-ACETAMINOPHEN 5-325 MG PO TABS: 2.0000 | ORAL_TABLET | Freq: Four times a day (QID) | ORAL | 0 refills | Status: AC | PRN

## 2016-01-14 MED ORDER — OXYCODONE-ACETAMINOPHEN 5-325 MG PO TABS
1.0000 | ORAL_TABLET | ORAL | Status: DC | PRN
  Administered 2016-01-14: 1 via ORAL

## 2016-01-14 NOTE — ED Notes (Signed)
Pt verbalized understanding to follow up with neurosurgeon and has no further questions. Pt stable and NAD.

## 2016-01-14 NOTE — Discharge Instructions (Signed)
Please take 1 tablet of Percocet every 6 hours as needed for pain. Take the Robaxin you have at home every 6 hours as needed. Please schedule appointment with Dr. Vertell Limber tomorrow for follow-up.  Please also schedule appointment with primary care physician for follow-up.  Get help right away if: Your arm, hand, or fingers: Tingle. Become numb. Become swollen. Become painful. Turn white or blue.

## 2016-01-14 NOTE — ED Notes (Signed)
Placed patient into a gown and on the monitor
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
## 2016-01-14 NOTE — ED Triage Notes (Signed)
Pt states R upper chest and shoulder pain that radiates down R arm.  Hx of cervical spine surgery on 10/24.  States pain improves when he lifts up his R arm and with heat.

## 2016-01-23 ENCOUNTER — Other Ambulatory Visit: Payer: Self-pay | Admitting: Surgery

## 2016-01-30 ENCOUNTER — Encounter: Payer: Self-pay | Admitting: Surgery

## 2016-01-30 ENCOUNTER — Ambulatory Visit (INDEPENDENT_AMBULATORY_CARE_PROVIDER_SITE_OTHER): Payer: Medicaid Other | Admitting: Surgery

## 2016-01-30 ENCOUNTER — Ambulatory Visit
Admission: RE | Admit: 2016-01-30 | Discharge: 2016-01-30 | Disposition: A | Discharge status: Home / Self Care | Payer: Medicaid Other | Source: Ambulatory Visit | Attending: Surgery | Admitting: Surgery

## 2016-01-30 ENCOUNTER — Other Ambulatory Visit: Payer: Self-pay | Admitting: *Deleted

## 2016-01-30 VITALS — BP 114/68 | HR 46 | Resp 16 | Ht 68.0 in | Wt 145.9 lb

## 2016-01-30 DIAGNOSIS — R911 Solitary pulmonary nodule: Secondary | ICD-10-CM | POA: Diagnosis not present

## 2016-01-30 DIAGNOSIS — Z902 Acquired absence of lung [part of]: Secondary | ICD-10-CM | POA: Diagnosis not present

## 2016-01-30 DIAGNOSIS — C3412 Malignant neoplasm of upper lobe, left bronchus or lung: Secondary | ICD-10-CM | POA: Diagnosis not present

## 2016-01-30 DIAGNOSIS — R918 Other nonspecific abnormal finding of lung field: Secondary | ICD-10-CM

## 2016-01-30 DIAGNOSIS — C3490 Malignant neoplasm of unspecified part of unspecified bronchus or lung: Secondary | ICD-10-CM

## 2016-01-31 ENCOUNTER — Encounter: Payer: Self-pay | Admitting: Surgery

## 2016-01-31 NOTE — Progress Notes (Signed)
HPI:  The patient returns today to review and discuss his CT scan done to follow up on a small spiculated nodule in the RUL. He is status post left upper lobectomy on 06/12/2011 for a stage IB (T2A, N0, M0) non-small cell lung cancer consistent with adenocarcinoma. This was a 2.0 cm tumor with visceral pleural invasion. A CT of the chest in March showed no PE but did show a new 7 mm spiculated nodule in the RUL. A follow up CT on 08/12/2015 showed no change in this nodule. His CT on 11/14/2015 showed slight interval enlargement of this nodule to 8 mm. He had PFT's on 06/21/2015 which showed a significant deterioration in lung function since his preop PFT's in 05/2011 with a diffusion capacity of only 26% predicted down from 31% and an FEV1 of 1.76, down from 2.5. He denies any headaches or visual changes. He denies any bone pain but has pain in his neck and shoulders due to three level cervical discectomy and fusion in October. He continues to smoke about half a pack of cigarettes per day and reports shortness of breath with low level activity.  Current Outpatient Prescriptions  Medication Sig Dispense Refill  . albuterol (PROVENTIL HFA;VENTOLIN HFA) 108 (90 BASE) MCG/ACT inhaler Inhale 2 puffs into the lungs every 6 (six) hours as needed for wheezing or shortness of breath.    [name] aspirin EC 81 MG tablet Take 81 mg by mouth at bedtime.    . gabapentin (NEURONTIN) 300 MG capsule Take 1 capsule (300 mg total) by mouth 3 (three) times daily. (Patient taking differently: Take 300 mg by mouth daily. ) 90 capsule 0  . guaiFENesin (MUCINEX) 600 MG 12 hr tablet Take 1 tablet (600 mg total) by mouth 2 (two) times daily. 60 tablet 0  . guaifenesin (ROBITUSSIN) 100 MG/5ML syrup Take 200 mg by mouth 3 (three) times daily as needed for cough.    [name] HYDROcodone-acetaminophen (NORCO/VICODIN) 5-325 MG tablet Take 1 tablet by mouth every 6 (six) hours as needed for moderate pain.    . methocarbamol (ROBAXIN) 500 MG  tablet Take 1 tablet (500 mg total) by mouth every 6 (six) hours as needed for muscle spasms. 60 tablet 1  . omeprazole (PRILOSEC) 20 MG capsule Take 20 mg by mouth daily.    . OXYGEN Inhale 2 L into the lungs at bedtime.    [name] tiotropium (SPIRIVA) 18 MCG inhalation capsule Place 1 capsule (18 mcg total) into inhaler and inhale daily. 30 capsule 3   No current facility-administered medications for this visit.      Physical Exam: BP 114/68 (BP Location: Right Arm, Patient Position: Sitting, Cuff Size: Large)   Pulse (!) 46   Resp 16   Ht '5\' 8"'$  (1.727 m)   Wt 145 lb 15.1 oz (66.2 kg)   SpO2 93% Comment: ON RA  BMI 22.19 kg/m  He looks well  There is no cervical or supraclavicular adenopathy. well-healed lower anterior cervical scar. Lung exam is clear.  The left thoracotomy scar is unremarkable.  Abdominal exam shows active bowel sounds. Abdomen is soft, flat and nontender. There are no palpable masses or organomegaly.    Diagnostic Tests:  CLINICAL DATA:  Followup right upper lobe pulmonary nodule. Emphysema. Previous left upper lobectomy for left lung carcinoma.  EXAM: CT CHEST WITHOUT CONTRAST  TECHNIQUE: Multidetector CT imaging of the chest was performed following the standard protocol without IV contrast.  COMPARISON:  11/14/2015 and 07/25/2015  FINDINGS:  Cardiovascular: No acute findings. Aortic and coronary artery atherosclerosis.  Mediastinum/Nodes: No masses or pathologically enlarged lymph nodes identified on this unenhanced exam.  Lungs/Pleura: Postop changes from previous left upper lobectomy. Moderate emphysema again demonstrated. Spiculated nodule in the medial right lung apex measures 10 x 8 mm on image 23/4, with mild increase in size compared to previous studies. This is highly suspicious for primary bronchogenic carcinoma. No other suspicious pulmonary nodules or masses are identified. No evidence of pleural effusion.  Upper Abdomen:   Unremarkable.  Musculoskeletal:  No suspicious bone lesions.  IMPRESSION: Continued slight increase in size of 9 mm mean diameter spiculated nodule in the right lung apex compared to prior exams, highly suspicious for primary bronchogenic carcinoma. Recommend PET-CT scan for further evaluation.  No evidence of thoracic metastatic disease.  Moderate emphysema and previous left upper lobectomy.  Aortic and coronary artery atherosclerosis.   Electronically Signed   By: Earle Gell M.D.   On: 01/30/2016 11:01      Impression:  There has been further increase in the size of the RUL nodule to 9-10 mm. It is spiculated and highly suspicious. I have recommended proceeding with a PET scan and repeating his PFT's. If this lesion is hypermetabolic on PET scan and nothing else is then we will have to decide about surgical resection vs XRT. The lesion is near the apex and peripheral so I think it can be removed with a wedge resection. I reviewed the CT scans with him and answered his questions.  Plan:  He will have a PET scan and PFT's with diffusion capacity and I will see him back afterwards.   Gaye Pollack, MD [facility] [phone]

## 2016-02-13 ENCOUNTER — Encounter (HOSPITAL_COMMUNITY): Payer: Medicaid Other

## 2016-02-13 ENCOUNTER — Ambulatory Visit (HOSPITAL_COMMUNITY)
Admission: RE | Admit: 2016-02-13 | Discharge: 2016-02-13 | Disposition: A | Discharge status: Home / Self Care | Payer: Medicaid Other | Source: Ambulatory Visit | Attending: Surgery | Admitting: Surgery

## 2016-02-13 DIAGNOSIS — R918 Other nonspecific abnormal finding of lung field: Secondary | ICD-10-CM | POA: Diagnosis not present

## 2016-02-13 LAB — PULMONARY FUNCTION TEST
DL/VA % pred: 28 %
DL/VA: 1.28 ml/min/mmHg/L
DLCO unc % pred: 23 %
DLCO unc: 7.16 ml/min/mmHg
FEF 25-75 Post: 1.04 L/sec
FEF 25-75 Pre: 0.62 L/sec
FEF2575-%Change-Post: 66 %
FEF2575-%Pred-Post: 37 %
FEF2575-%Pred-Pre: 22 %
FEV1-%Change-Post: 11 %
FEV1-%Pred-Post: 59 %
FEV1-%Pred-Pre: 53 %
FEV1-Post: 1.78 L
FEV1-Pre: 1.59 L
FEV1FVC-%Change-Post: -21 %
FEV1FVC-%Pred-Pre: 66 %
FEV6-%Change-Post: 33 %
FEV6-%Pred-Post: 101 %
FEV6-%Pred-Pre: 75 %
FEV6-Post: 3.75 L
FEV6-Pre: 2.8 L
FEV6FVC-%Change-Post: -5 %
FEV6FVC-%Pred-Post: 88 %
FEV6FVC-%Pred-Pre: 94 %
FVC-%Change-Post: 42 %
FVC-%Pred-Post: 114 %
FVC-%Pred-Pre: 80 %
FVC-Post: 4.39 L
FVC-Pre: 3.09 L
Post FEV1/FVC ratio: 40 %
Post FEV6/FVC ratio: 85 %
Pre FEV1/FVC ratio: 52 %
Pre FEV6/FVC Ratio: 91 %
RV % pred: 86 %
RV: 1.92 L
TLC % pred: 93 %
TLC: 6.36 L

## 2016-02-13 MED ORDER — ALBUTEROL SULFATE (2.5 MG/3ML) 0.083% IN NEBU
2.5000 mg | INHALATION_SOLUTION | Freq: Once | RESPIRATORY_TRACT | Status: AC
  Administered 2016-02-13: 2.5 mg via RESPIRATORY_TRACT

## 2016-02-20 ENCOUNTER — Ambulatory Visit: Payer: Medicaid Other | Admitting: Surgery

## 2016-02-25 ENCOUNTER — Other Ambulatory Visit: Payer: Self-pay | Admitting: *Deleted

## 2016-02-25 DIAGNOSIS — R918 Other nonspecific abnormal finding of lung field: Secondary | ICD-10-CM

## 2016-02-26 ENCOUNTER — Ambulatory Visit (HOSPITAL_COMMUNITY): Payer: Medicaid Other

## 2016-02-26 ENCOUNTER — Encounter (HOSPITAL_COMMUNITY)
Admission: RE | Admit: 2016-02-26 | Discharge: 2016-02-26 | Disposition: A | Discharge status: Home / Self Care | Payer: Medicaid Other | Source: Ambulatory Visit | Attending: Surgery | Admitting: Surgery

## 2016-02-26 DIAGNOSIS — R918 Other nonspecific abnormal finding of lung field: Secondary | ICD-10-CM

## 2016-02-26 LAB — GLUCOSE, CAPILLARY: Glucose-Capillary: 96 mg/dL (ref 65–99)

## 2016-02-26 MED ORDER — FLUDEOXYGLUCOSE F - 18 (FDG) INJECTION
8.0000 | Freq: Once | INTRAVENOUS | Status: AC | PRN
  Administered 2016-02-26: 8 via INTRAVENOUS

## 2016-02-27 ENCOUNTER — Encounter: Payer: Self-pay | Admitting: Surgery

## 2016-02-27 ENCOUNTER — Ambulatory Visit (INDEPENDENT_AMBULATORY_CARE_PROVIDER_SITE_OTHER): Payer: Medicaid Other | Admitting: Surgery

## 2016-02-27 VITALS — BP 102/71 | HR 68 | Resp 16 | Ht 68.0 in | Wt 145.0 lb

## 2016-02-27 DIAGNOSIS — R911 Solitary pulmonary nodule: Secondary | ICD-10-CM | POA: Diagnosis not present

## 2016-02-27 DIAGNOSIS — C3412 Malignant neoplasm of upper lobe, left bronchus or lung: Secondary | ICD-10-CM | POA: Diagnosis not present

## 2016-02-27 DIAGNOSIS — Z902 Acquired absence of lung [part of]: Secondary | ICD-10-CM | POA: Diagnosis not present

## 2016-02-27 NOTE — Progress Notes (Signed)
HPI:  The patient returns today to discuss the results of his recent PET scan and make further plans for workup and treatment of his RUL lung nodule. The PET scan shows hypermetabolic activity in the RUL lung nodule with an SUV max of 12.65. There is a mildly hypermetabolic 7 mm pretracheal lymph node with an SUV of 3.7 and a 7.5 mm precarinal lymph node with an SUV of 3.7. There is an AP window lymph node with an SUV of 4.2. There is no other hypermetabolic uptake. He denies any headaches or visual changes. He denies any bone pain but has pain in his neck and shoulders due to three level cervical discectomy and fusion in October. He continues to smoke about half a pack of cigarettes per day and reports shortness of breath with low level activity.  Current Outpatient Prescriptions  Medication Sig Dispense Refill  . albuterol (PROVENTIL HFA;VENTOLIN HFA) 108 (90 BASE) MCG/ACT inhaler Inhale 2 puffs into the lungs every 6 (six) hours as needed for wheezing or shortness of breath.    [name] aspirin EC 81 MG tablet Take 81 mg by mouth at bedtime.    . gabapentin (NEURONTIN) 300 MG capsule Take 1 capsule (300 mg total) by mouth 3 (three) times daily. (Patient taking differently: Take 300 mg by mouth daily. ) 90 capsule 0  . guaiFENesin (MUCINEX) 600 MG 12 hr tablet Take 1 tablet (600 mg total) by mouth 2 (two) times daily. 60 tablet 0  . guaifenesin (ROBITUSSIN) 100 MG/5ML syrup Take 200 mg by mouth 3 (three) times daily as needed for cough.    [name] HYDROcodone-acetaminophen (NORCO/VICODIN) 5-325 MG tablet Take 1 tablet by mouth every 6 (six) hours as needed for moderate pain.    . methocarbamol (ROBAXIN) 500 MG tablet Take 1 tablet (500 mg total) by mouth every 6 (six) hours as needed for muscle spasms. 60 tablet 1  . omeprazole (PRILOSEC) 20 MG capsule Take 20 mg by mouth daily.    . OXYGEN Inhale 2 L into the lungs at bedtime.    [name] tiotropium (SPIRIVA) 18 MCG inhalation capsule Place 1 capsule (18 mcg  total) into inhaler and inhale daily. 30 capsule 3   No current facility-administered medications for this visit.      Physical Exam: BP 102/71 (BP Location: Left Arm, Patient Position: Sitting, Cuff Size: Large)   Pulse 68   Resp 16   Ht _0  (1.727 m)   Wt 145 lb (65.8 kg)   SpO2 94% Comment: ON RA  BMI 22.05 kg/m  He looks well There is no cervical or supraclavicular adenopathy. well-healed lower anterior cervical scar. Lung exam is clear.  The left thoracotomy scar is unremarkable.  Abdominal exam shows active bowel sounds. Abdomen is soft, flat and nontender. There are no palpable masses or organomegaly.    Diagnostic Tests:  NM PET Image Restag (PS) Skull Base To Thigh (Accession [id number]) (Order [id number])  Imaging  Date: 02/26/2016 Department: [facility] Released By: Marquis Lunch Authorizing: Gaye Pollack, MD  Exam Information   Status Exam Begun  Exam Ended   Final [99] 02/26/2016 2:02 PM 02/26/2016 2:55 PM  PACS Images   Show images for NM PET Image Restag (PS) Skull Base To Thigh  Study Result   CLINICAL DATA:  Subsequent Treatment strategy for lung cancer. Enlarging right upper lobe pulmonary nodule. Prior history of left upper lobe lung nodule resection (adenocarcinoma).  EXAM: NUCLEAR MEDICINE PET  SKULL BASE TO THIGH  TECHNIQUE: 8.0 mCi F-18 FDG was injected intravenously. Full-ring PET imaging was performed from the skull base to thigh after the radiotracer. CT data was obtained and used for attenuation correction and anatomic localization.  FASTING BLOOD GLUCOSE:  Value: 96 mg/dl  COMPARISON:  PET-CT 05/27/2011 and chest CT 01/30/2016  FINDINGS: NECK  No hypermetabolic lymph nodes in the neck.  CHEST  The 10 mm right apical lung nodule is hypermetabolic with SUV max of 29.56. This is consistent with neoplasm. No other pulmonary lesions are identified. Patient is status post resection  of a left upper lobe pulmonary nodule.  7 mm pretracheal lymph node on image number 56 of the CT scan is mildly hypermetabolic with SUV max of 3.7. The precarinal lymph node measuring 7.5 mm on image number 66 is mildly hypermetabolic with SUV max of 3.7. Aorticopulmonary window lymph node on image number 64 measures 7 mm and SUV max is 4.2.  Stable underling emphysematous changes and pulmonary scarring. No pleural effusion. Stable three-vessel coronary artery calcifications.  ABDOMEN/PELVIS  No abnormal hypermetabolic activity within the liver, pancreas, adrenal glands, or spleen. No hypermetabolic lymph nodes in the abdomen or pelvis.  Advanced atherosclerotic calcifications involving the aorta and iliac arteries but no aneurysm.  SKELETON  No focal hypermetabolic activity to suggest skeletal metastasis.  IMPRESSION: 1. Hypermetabolic right upper lobe pulmonary nodule consistent with neoplasm. 2. Small mediastinal lymph nodes are weakly hypermetabolic and indeterminate but suspicious. 3. Advanced emphysematous changes and pulmonary scarring. 4. No findings for metastatic disease involving the neck, abdomen/pelvis or osseous structures.   Electronically Signed   By: Marijo Sanes M.D.   On: 02/26/2016 16:51      Impression:  He has an 10 mm spiculated nodule in the apex of the right lung that is highly suspicious for bronchogenic carcinoma in this smoker with prior lung cancer resected in 06/2011. There are a few mediastinal lymph nodes with mild hypermetabolic activity that could be involved with metastatic disease and I think mediastinoscopy is indicated to assess this. If the lymph nodes are negative then I would consider doing a VATS wedge resection of the RUL lung nodule. He is not a candidate for lobectomy.   Plan:  I will plan to do a flexible bronchoscopy and mediastinoscopy on Monday 03/03/2016. I discussed the operative procedure with him including  alternatives, benefits and risks including but not limited to bleeding, blood transfusion, injury to mediastinal structures, infection, false negative biopsy results and he understands and agrees to proceed.   Gaye Pollack, MD [facility] [phone]

## 2016-02-28 ENCOUNTER — Other Ambulatory Visit: Payer: Self-pay | Admitting: *Deleted

## 2016-02-28 DIAGNOSIS — R590 Localized enlarged lymph nodes: Secondary | ICD-10-CM

## 2016-03-06 ENCOUNTER — Encounter (HOSPITAL_COMMUNITY)
Admission: RE | Admit: 2016-03-06 | Discharge: 2016-03-06 | Disposition: A | Discharge status: Home / Self Care | Payer: Medicaid Other | Source: Ambulatory Visit | Attending: Surgery | Admitting: Surgery

## 2016-03-06 ENCOUNTER — Encounter (HOSPITAL_COMMUNITY): Payer: Self-pay

## 2016-03-06 DIAGNOSIS — R590 Localized enlarged lymph nodes: Secondary | ICD-10-CM | POA: Insufficient documentation

## 2016-03-06 LAB — COMPREHENSIVE METABOLIC PANEL
ALT: 12 U/L — ABNORMAL LOW (ref 17–63)
AST: 18 U/L (ref 15–41)
Albumin: 4 g/dL (ref 3.5–5.0)
Alkaline Phosphatase: 107 U/L (ref 38–126)
Anion gap: 7 (ref 5–15)
BUN: 6 mg/dL (ref 6–20)
CO2: 24 mmol/L (ref 22–32)
Calcium: 9.3 mg/dL (ref 8.9–10.3)
Chloride: 109 mmol/L (ref 101–111)
Creatinine, Ser: 1.19 mg/dL (ref 0.61–1.24)
GFR calc Af Amer: >60 mL/min (ref >60)
GFR calc non Af Amer: >60 mL/min (ref >60)
Glucose, Bld: 115 mg/dL — ABNORMAL HIGH (ref 65–99)
Potassium: 4.1 mmol/L (ref 3.5–5.1)
Sodium: 140 mmol/L (ref 135–145)
Total Bilirubin: 0.6 mg/dL (ref 0.3–1.2)
Total Protein: 6.8 g/dL (ref 6.5–8.1)

## 2016-03-06 LAB — TYPE AND SCREEN
ABO/RH(D): O POS
Antibody Screen: NEGATIVE

## 2016-03-06 LAB — CBC
HCT: 44.4 % (ref 39.0–52.0)
Hemoglobin: 14.6 g/dL (ref 13.0–17.0)
MCH: 30.7 pg (ref 26.0–34.0)
MCHC: 32.9 g/dL (ref 30.0–36.0)
MCV: 93.3 fL (ref 78.0–100.0)
Platelets: 258 10*3/uL (ref 150–400)
RBC: 4.76 MIL/uL (ref 4.22–5.81)
RDW: 14.7 % (ref 11.5–15.5)
WBC: 6.2 10*3/uL (ref 4.0–10.5)

## 2016-03-06 LAB — APTT: aPTT: 30 seconds (ref 24–36)

## 2016-03-06 LAB — SURGICAL PCR SCREEN
MRSA, PCR: NEGATIVE
Staphylococcus aureus: NEGATIVE

## 2016-03-06 LAB — PROTIME-INR
INR: 1.1
Prothrombin Time: 14.2 seconds (ref 11.4–15.2)

## 2016-03-06 NOTE — Pre-Procedure Instructions (Signed)
    [name]  03/06/2016    Your procedure is scheduled on Monday, February 5.  Report to [facility] Admitting at 5:30 AM                 Your surgery or procedure is scheduled for 7:30 AM   Call this number if you have problems the morning of surgery:[phone]    Remember:  Do not eat food or drink liquids after midnight Sunday, February 4.  Take these medicines the morning of surgery with A SIP OF WATER : gabapentin (NEURONTIN), omeprazole (PRILOSEC). Use SPIRIVA Inhaler.   My take if needed:acetaminophen (TYLENOL) orHYDROcodone-acetaminophen (NORCO/VICODIN), guaiFENesin (MUCINEX), may use albuterol (PROVENTIL HFA;VENTOLIN HFA)- bring it with you to the hospital.   Do not wear jewelry, make-up or nail polish.  Do not wear lotions, powders, or perfumes, or deodorant.             Men may shave face and neck.  Do not bring valuables to the hospital.  [facility] is not responsible for any belongings or valuables.  Contacts, dentures or bridgework may not be worn into surgery.  Leave your suitcase in the car.  After surgery it may be brought to your room.  For patients admitted to the hospital, discharge time will be determined by your treatment team.  Patients discharged the day of surgery will not be allowed to drive home.   Name and phone number of your driver:- [name] - bring his phone number the morning of surgery.  Special instructions: Review  [facility] - Preparing For Surgery.   Please read over the following fact sheets that you were given. Coughing and Deep Breathing, Pain Management.

## 2016-03-09 NOTE — Anesthesia Preprocedure Evaluation (Addendum)
Anesthesia Evaluation  Patient identified by MRN, date of birth, ID band Patient awake    Reviewed: Allergy & Precautions, NPO status , Patient's Chart, lab work & pertinent test results  History of Anesthesia Complications Negative for: history of anesthetic complications  Airway Mallampati: II  TM Distance: >3 FB Neck ROM: Full    Dental  (+) Dental Advisory Given, Edentulous Lower, Edentulous Upper   Pulmonary COPD,  COPD inhaler and oxygen dependent, Current Smoker,  Lung cancer s/p thoracotomy PET SCAN: 10 mm spiculated nodule in the apex of the right lung that is highly suspicious for bronchogenic carcinoma in this smoker with prior lung cancer resected in 06/2011   Pulmonary exam normal breath sounds clear to auscultation       Cardiovascular (-) hypertension+ CAD and + Past MI  (-) CHF Normal cardiovascular exam Rhythm:Regular Rate:Normal     Neuro/Psych  Headaches, PSYCHIATRIC DISORDERS Depression    GI/Hepatic GERD  Medicated,(+)     substance abuse  alcohol use and cocaine use,   Endo/Other  negative endocrine ROS  Renal/GU negative Renal ROS     Musculoskeletal  (+) Arthritis , Osteoarthritis,    Abdominal   Peds  Hematology negative hematology ROS (+)   Anesthesia Other Findings Day of surgery medications reviewed with the patient.  Reproductive/Obstetrics                           Anesthesia Physical Anesthesia Plan  ASA: III  Anesthesia Plan: General   Post-op Pain Management:    Induction: Intravenous  Airway Management Planned: Oral ETT  Additional Equipment:   Intra-op Plan:   Post-operative Plan: Extubation in OR  Informed Consent: I have reviewed the patients History and Physical, chart, labs and discussed the procedure including the risks, benefits and alternatives for the proposed anesthesia with the patient or authorized representative who has indicated  his/her understanding and acceptance.   Dental advisory given  Plan Discussed with: CRNA  Anesthesia Plan Comments: (Risks/benefits of general anesthesia discussed with patient including risk of damage to teeth, lips, gum, and tongue, nausea/vomiting, allergic reactions to medications, and the possibility of heart attack, stroke and death.  All patient questions answered.  Patient wishes to proceed.  )      Anesthesia Quick Evaluation

## 2016-03-10 ENCOUNTER — Encounter (HOSPITAL_COMMUNITY)
Admission: RE | Disposition: A | Discharge status: Home / Self Care | Payer: Self-pay | Source: Ambulatory Visit | Attending: Surgery

## 2016-03-10 ENCOUNTER — Ambulatory Visit (HOSPITAL_COMMUNITY): Payer: Medicaid Other | Admitting: Certified Registered Nurse Anesthetist

## 2016-03-10 ENCOUNTER — Encounter (HOSPITAL_COMMUNITY): Payer: Self-pay | Admitting: Certified Registered Nurse Anesthetist

## 2016-03-10 ENCOUNTER — Ambulatory Visit (HOSPITAL_COMMUNITY)
Admission: RE | Admit: 2016-03-10 | Discharge: 2016-03-10 | Disposition: A | Discharge status: Home / Self Care | Payer: Medicaid Other | Source: Ambulatory Visit | Attending: Surgery | Admitting: Surgery

## 2016-03-10 ENCOUNTER — Ambulatory Visit (HOSPITAL_COMMUNITY): Payer: Medicaid Other

## 2016-03-10 DIAGNOSIS — I252 Old myocardial infarction: Secondary | ICD-10-CM | POA: Insufficient documentation

## 2016-03-10 DIAGNOSIS — Z981 Arthrodesis status: Secondary | ICD-10-CM | POA: Insufficient documentation

## 2016-03-10 DIAGNOSIS — Z79899 Other long term (current) drug therapy: Secondary | ICD-10-CM | POA: Diagnosis not present

## 2016-03-10 DIAGNOSIS — Z7982 Long term (current) use of aspirin: Secondary | ICD-10-CM | POA: Diagnosis not present

## 2016-03-10 DIAGNOSIS — Z85118 Personal history of other malignant neoplasm of bronchus and lung: Secondary | ICD-10-CM | POA: Diagnosis not present

## 2016-03-10 DIAGNOSIS — Z9981 Dependence on supplemental oxygen: Secondary | ICD-10-CM | POA: Insufficient documentation

## 2016-03-10 DIAGNOSIS — F17210 Nicotine dependence, cigarettes, uncomplicated: Secondary | ICD-10-CM | POA: Diagnosis not present

## 2016-03-10 DIAGNOSIS — K219 Gastro-esophageal reflux disease without esophagitis: Secondary | ICD-10-CM | POA: Diagnosis not present

## 2016-03-10 DIAGNOSIS — Z902 Acquired absence of lung [part of]: Secondary | ICD-10-CM | POA: Diagnosis not present

## 2016-03-10 DIAGNOSIS — J439 Emphysema, unspecified: Secondary | ICD-10-CM | POA: Diagnosis not present

## 2016-03-10 DIAGNOSIS — R590 Localized enlarged lymph nodes: Secondary | ICD-10-CM | POA: Diagnosis not present

## 2016-03-10 DIAGNOSIS — R911 Solitary pulmonary nodule: Secondary | ICD-10-CM | POA: Diagnosis not present

## 2016-03-10 DIAGNOSIS — Z09 Encounter for follow-up examination after completed treatment for conditions other than malignant neoplasm: Secondary | ICD-10-CM

## 2016-03-10 DIAGNOSIS — I2510 Atherosclerotic heart disease of native coronary artery without angina pectoris: Secondary | ICD-10-CM | POA: Diagnosis not present

## 2016-03-10 HISTORY — PX: MEDIASTINOSCOPY: SHX5086

## 2016-03-10 HISTORY — PX: FLEXIBLE BRONCHOSCOPY: SHX5094

## 2016-03-10 SURGERY — MEDIASTINOSCOPY
Anesthesia: General

## 2016-03-10 MED ORDER — FENTANYL CITRATE (PF) 100 MCG/2ML IJ SOLN
25.0000 ug | INTRAMUSCULAR | Status: DC | PRN
  Administered 2016-03-10: 25 ug via INTRAVENOUS

## 2016-03-10 MED ORDER — SUGAMMADEX SODIUM 200 MG/2ML IV SOLN
INTRAVENOUS | Status: DC | PRN
  Administered 2016-03-10: 130 mg via INTRAVENOUS

## 2016-03-10 MED ORDER — MIDAZOLAM HCL 2 MG/2ML IJ SOLN
INTRAMUSCULAR | Status: AC
  Filled 2016-03-10: qty 2

## 2016-03-10 MED ORDER — 0.9 % SODIUM CHLORIDE (POUR BTL) OPTIME
TOPICAL | Status: DC | PRN
  Administered 2016-03-10: 1000 mL

## 2016-03-10 MED ORDER — PROPOFOL 10 MG/ML IV BOLUS
INTRAVENOUS | Status: AC
  Filled 2016-03-10: qty 40

## 2016-03-10 MED ORDER — EPINEPHRINE PF 1 MG/ML IJ SOLN
INTRAMUSCULAR | Status: AC
  Filled 2016-03-10: qty 1

## 2016-03-10 MED ORDER — PROPOFOL 10 MG/ML IV BOLUS
INTRAVENOUS | Status: DC | PRN
  Administered 2016-03-10: 150 mg via INTRAVENOUS
  Administered 2016-03-10: 20 mg via INTRAVENOUS

## 2016-03-10 MED ORDER — ROCURONIUM BROMIDE 100 MG/10ML IV SOLN
INTRAVENOUS | Status: DC | PRN
  Administered 2016-03-10: 50 mg via INTRAVENOUS

## 2016-03-10 MED ORDER — ARTIFICIAL TEARS OP OINT
TOPICAL_OINTMENT | OPHTHALMIC | Status: DC | PRN
  Administered 2016-03-10: 1 via OPHTHALMIC

## 2016-03-10 MED ORDER — LIDOCAINE 2% (20 MG/ML) 5 ML SYRINGE
INTRAMUSCULAR | Status: AC
  Filled 2016-03-10: qty 5

## 2016-03-10 MED ORDER — DEXTROSE 5 % IV SOLN
1.5000 g | INTRAVENOUS | Status: AC
  Administered 2016-03-10: 1.5 g via INTRAVENOUS

## 2016-03-10 MED ORDER — DEXTROSE 5 % IV SOLN
INTRAVENOUS | Status: AC
  Filled 2016-03-10: qty 1.5

## 2016-03-10 MED ORDER — FENTANYL CITRATE (PF) 100 MCG/2ML IJ SOLN
INTRAMUSCULAR | Status: AC
  Filled 2016-03-10: qty 2

## 2016-03-10 MED ORDER — EPHEDRINE 5 MG/ML INJ
INTRAVENOUS | Status: AC
  Filled 2016-03-10: qty 10

## 2016-03-10 MED ORDER — LIDOCAINE HCL (CARDIAC) 20 MG/ML IV SOLN
INTRAVENOUS | Status: DC | PRN
  Administered 2016-03-10: 60 mg via INTRAVENOUS

## 2016-03-10 MED ORDER — ACETAMINOPHEN 500 MG PO TABS
1000.0000 mg | ORAL_TABLET | Freq: Once | ORAL | Status: AC
  Administered 2016-03-10: 1000 mg via ORAL

## 2016-03-10 MED ORDER — PHENYLEPHRINE 40 MCG/ML (10ML) SYRINGE FOR IV PUSH (FOR BLOOD PRESSURE SUPPORT)
PREFILLED_SYRINGE | INTRAVENOUS | Status: DC | PRN
  Administered 2016-03-10 (×2): 120 ug via INTRAVENOUS
  Administered 2016-03-10: 40 ug via INTRAVENOUS
  Administered 2016-03-10: 120 ug via INTRAVENOUS

## 2016-03-10 MED ORDER — ONDANSETRON HCL 4 MG/2ML IJ SOLN
INTRAMUSCULAR | Status: DC | PRN
  Administered 2016-03-10: 4 mg via INTRAVENOUS

## 2016-03-10 MED ORDER — LACTATED RINGERS IV SOLN
INTRAVENOUS | Status: DC | PRN
  Administered 2016-03-10: 07:00:00 via INTRAVENOUS

## 2016-03-10 MED ORDER — SUCCINYLCHOLINE CHLORIDE 200 MG/10ML IV SOSY
PREFILLED_SYRINGE | INTRAVENOUS | Status: AC
  Filled 2016-03-10: qty 10

## 2016-03-10 MED ORDER — ONDANSETRON HCL 4 MG/2ML IJ SOLN: 4.0000 mg | Freq: Once | INTRAMUSCULAR | Status: DC | PRN

## 2016-03-10 MED ORDER — ACETAMINOPHEN 500 MG PO TABS
ORAL_TABLET | ORAL | Status: AC
  Filled 2016-03-10: qty 2

## 2016-03-10 MED ORDER — PHENYLEPHRINE 40 MCG/ML (10ML) SYRINGE FOR IV PUSH (FOR BLOOD PRESSURE SUPPORT)
PREFILLED_SYRINGE | INTRAVENOUS | Status: AC
  Filled 2016-03-10: qty 10

## 2016-03-10 MED ORDER — ONDANSETRON HCL 4 MG/2ML IJ SOLN
INTRAMUSCULAR | Status: AC
  Filled 2016-03-10: qty 2

## 2016-03-10 MED ORDER — FENTANYL CITRATE (PF) 100 MCG/2ML IJ SOLN
INTRAMUSCULAR | Status: DC | PRN
Start: 2016-03-10 — End: 2016-03-10
  Administered 2016-03-10: 100 ug via INTRAVENOUS

## 2016-03-10 SURGICAL SUPPLY — 52 items
BLADE SURG 15 STRL LF DISP TIS (BLADE) ×1 IMPLANT
BLADE SURG 15 STRL SS (BLADE) ×2
BRUSH CYTOL CELLEBRITY 1.5X140 (MISCELLANEOUS) IMPLANT
CANISTER SUCTION 2500CC (MISCELLANEOUS) ×3 IMPLANT
CLIP TI MEDIUM 6 (CLIP) ×3 IMPLANT
CONT SPEC 4OZ CLIKSEAL STRL BL (MISCELLANEOUS) ×12 IMPLANT
COTTONBALL LRG STERILE PKG (GAUZE/BANDAGES/DRESSINGS) IMPLANT
COVER SURGICAL LIGHT HANDLE (MISCELLANEOUS) ×6 IMPLANT
COVER TABLE BACK 60X90 (DRAPES) ×3 IMPLANT
DERMABOND ADVANCED (GAUZE/BANDAGES/DRESSINGS) ×2
DERMABOND ADVANCED .7 DNX12 (GAUZE/BANDAGES/DRESSINGS) ×1 IMPLANT
DRAPE LAPAROTOMY T 102X78X121 (DRAPES) ×3 IMPLANT
ELECT CAUTERY BLADE 6.4 (BLADE) ×3 IMPLANT
ELECT REM PT RETURN 9FT ADLT (ELECTROSURGICAL) ×3
ELECTRODE REM PT RTRN 9FT ADLT (ELECTROSURGICAL) ×1 IMPLANT
FORCEPS BIOP RJ4 1.8 (CUTTING FORCEPS) IMPLANT
GAUZE SPONGE 4X4 12PLY STRL (GAUZE/BANDAGES/DRESSINGS) ×3 IMPLANT
GAUZE SPONGE 4X4 16PLY XRAY LF (GAUZE/BANDAGES/DRESSINGS) ×3 IMPLANT
GLOVE EUDERMIC 7 POWDERFREE (GLOVE) ×3 IMPLANT
GOWN STRL REUS W/ TWL LRG LVL3 (GOWN DISPOSABLE) ×1 IMPLANT
GOWN STRL REUS W/ TWL XL LVL3 (GOWN DISPOSABLE) ×1 IMPLANT
GOWN STRL REUS W/TWL LRG LVL3 (GOWN DISPOSABLE) ×2
GOWN STRL REUS W/TWL XL LVL3 (GOWN DISPOSABLE) ×2
HEMOSTAT SURGICEL 2X14 (HEMOSTASIS) IMPLANT
KIT BASIN OR (CUSTOM PROCEDURE TRAY) ×3 IMPLANT
KIT ROOM TURNOVER OR (KITS) ×3 IMPLANT
MARKER SKIN DUAL TIP RULER LAB (MISCELLANEOUS) ×3 IMPLANT
NEEDLE 22X1 1/2 (OR ONLY) (NEEDLE) IMPLANT
NEEDLE BIOPSY TRANSBRONCH 21G (NEEDLE) IMPLANT
NS IRRIG 1000ML POUR BTL (IV SOLUTION) ×3 IMPLANT
OIL SILICONE PENTAX (PARTS (SERVICE/REPAIRS)) ×3 IMPLANT
PACK SURGICAL SETUP 50X90 (CUSTOM PROCEDURE TRAY) ×3 IMPLANT
PAD ARMBOARD 7.5X6 YLW CONV (MISCELLANEOUS) ×6 IMPLANT
PENCIL BUTTON HOLSTER BLD 10FT (ELECTRODE) ×3 IMPLANT
SPONGE INTESTINAL PEANUT (DISPOSABLE) ×3 IMPLANT
SUT SILK 2 0 SH (SUTURE) ×3 IMPLANT
SUT SILK 2 0 TIES 10X30 (SUTURE) IMPLANT
SUT VIC AB 2-0 CT1 27 (SUTURE) ×2
SUT VIC AB 2-0 CT1 TAPERPNT 27 (SUTURE) ×1 IMPLANT
SUT VIC AB 3-0 SH 27 (SUTURE)
SUT VIC AB 3-0 SH 27X BRD (SUTURE) IMPLANT
SUT VICRYL 4-0 PS2 18IN ABS (SUTURE) ×3 IMPLANT
SYR 20ML ECCENTRIC (SYRINGE) ×3 IMPLANT
SYR 5ML LUER SLIP (SYRINGE) ×3 IMPLANT
SYR CONTROL 10ML LL (SYRINGE) IMPLANT
SYRINGE 10CC LL (SYRINGE) ×3 IMPLANT
TOWEL OR 17X24 6PK STRL BLUE (TOWEL DISPOSABLE) ×6 IMPLANT
TOWEL OR 17X26 10 PK STRL BLUE (TOWEL DISPOSABLE) ×3 IMPLANT
TRAP SPECIMEN MUCOUS 40CC (MISCELLANEOUS) ×3 IMPLANT
TUBE CONNECTING 12'X1/4 (SUCTIONS) ×1
TUBE CONNECTING 12X1/4 (SUCTIONS) ×2 IMPLANT
WATER STERILE IRR 1000ML POUR (IV SOLUTION) ×3 IMPLANT

## 2016-03-10 NOTE — Brief Op Note (Signed)
03/10/2016  9:04 AM  PATIENT:  [name]  61 y.o. male  PRE-OPERATIVE DIAGNOSIS:  MEDIASTINAL ADENOPATHY and Right upper lobe lung nodule  POST-OPERATIVE DIAGNOSIS:  MEDIASTINAL ADENOPATHY and Right upper lobe lung nodule  PROCEDURE:  Procedure(s): MEDIASTINOSCOPY (N/A) FLEXIBLE BRONCHOSCOPY (N/A)  SURGEON:  Surgeon(s) and Role:    * Gaye Pollack, MD - Primary  PHYSICIAN ASSISTANT: none  ASSISTANTS: none   ANESTHESIA:   general  EBL:  Total I/O In: 500 [I.V.:500] Out: -   BLOOD ADMINISTERED:none  DRAINS: none   LOCAL MEDICATIONS USED:  NONE  SPECIMEN:  Source of Specimen:  R4 x 2, level 7, L4 lymph nodes  DISPOSITION OF SPECIMEN:  PATHOLOGY  COUNTS:  YES  TOURNIQUET:  * No tourniquets in log *  DICTATION: .Note written in EPIC  PLAN OF CARE: Discharge to home after PACU  PATIENT DISPOSITION:  PACU - hemodynamically stable.   Delay start of Pharmacological VTE agent (>24hrs) due to surgical blood loss or risk of bleeding: not applicable
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
## 2016-03-10 NOTE — Discharge Instructions (Addendum)
The incision is covered with Dermabond surgical adhesive that is waterproof. You may shower. No dressing needed. Do not apply creams, lotions, or antibiotic ointments to incision.

## 2016-03-10 NOTE — Transfer of Care (Signed)
Immediate Anesthesia Transfer of Care Note  Patient: [name]  Procedure(s) Performed: Procedure(s): MEDIASTINOSCOPY (N/A) FLEXIBLE BRONCHOSCOPY (N/A)  Patient Location: PACU  Anesthesia Type:General  Level of Consciousness: awake and patient cooperative  Airway & Oxygen Therapy: Patient Spontanous Breathing and Patient connected to nasal cannula oxygen  Post-op Assessment: Report given to RN, Post -op Vital signs reviewed and stable and Patient moving all extremities X 4  Post vital signs: Reviewed and stable  Last Vitals:  BP 121/78 HR 68 RR 18 SpO2 97% on 4L [place] Resting comfortably, maintains good airway, denies pain.   Last Pain:  Vitals:   03/10/16 0551  TempSrc: Oral         Complications: No apparent anesthesia complications

## 2016-03-10 NOTE — Anesthesia Postprocedure Evaluation (Signed)
Anesthesia Post Note  Patient: [name]  Procedure(s) Performed: Procedure(s) (LRB): MEDIASTINOSCOPY (N/A) FLEXIBLE BRONCHOSCOPY (N/A)  Patient location during evaluation: PACU Anesthesia Type: General Level of consciousness: awake and alert Pain management: pain level controlled Vital Signs Assessment: post-procedure vital signs reviewed and stable Respiratory status: spontaneous breathing, nonlabored ventilation, respiratory function stable and patient connected to nasal cannula oxygen Cardiovascular status: blood pressure returned to baseline and stable Postop Assessment: no signs of nausea or vomiting Anesthetic complications: no       Last Vitals:  Vitals:   03/10/16 1015 03/10/16 1030  BP: 128/79 118/80  Pulse: (!) 55 (!) 53  Resp: 10 14  Temp:  36.6 C    Last Pain:  Vitals:   03/10/16 1034  TempSrc:   PainSc: 0-No pain                 Catalina Gravel

## 2016-03-10 NOTE — Interval H&P Note (Signed)
History and Physical Interval Note:  03/10/2016 6:09 AM  [name]  has presented today for surgery, with the diagnosis of MEDIASTINAL ADENOPATHY  The various methods of treatment have been discussed with the patient and family. After consideration of risks, benefits and other options for treatment, the patient has consented to  Procedure(s): MEDIASTINOSCOPY (N/A) FLEXIBLE BRONCHOSCOPY (N/A) as a surgical intervention .  The patient's history has been reviewed, patient examined, no change in status, stable for surgery.  I have reviewed the patient's chart and labs.  Questions were answered to the patient's satisfaction.     Gaye Pollack

## 2016-03-10 NOTE — Anesthesia Procedure Notes (Signed)
Procedure Name: Intubation Date/Time: 03/10/2016 7:38 AM Performed by: Willeen Cass P Pre-anesthesia Checklist: Patient identified, Emergency Drugs available, Suction available and Patient being monitored Patient Re-evaluated:Patient Re-evaluated prior to inductionOxygen Delivery Method: Circle System Utilized Preoxygenation: Pre-oxygenation with 100% oxygen Intubation Type: IV induction Ventilation: Mask ventilation without difficulty Laryngoscope Size: Mac and 4 Grade View: Grade I Tube type: Oral Tube size: 9.0 mm Number of attempts: 1 Airway Equipment and Method: Stylet and Oral airway Placement Confirmation: ETT inserted through vocal cords under direct vision,  positive ETCO2 and breath sounds checked- equal and bilateral Secured at: 22 cm Tube secured with: Tape Dental Injury: Teeth and Oropharynx as per pre-operative assessment

## 2016-03-11 ENCOUNTER — Encounter (HOSPITAL_COMMUNITY): Payer: Self-pay | Admitting: Surgery

## 2016-03-11 NOTE — Op Note (Signed)
CARDIOTHORACIC SURGERY OPERATIVE NOTE 03/11/2016 [name] [id number]  Surgeon:  Gaye Pollack, MD  First Assistant: none  Preoperative Diagnosis:  Right upper lobe lung nodule and mediastinal adenopathy  Postoperative Diagnosis:  same  Procedure:  1.  Flexible video bronchoscopy 2.  Mediastinoscopy and lymph node biopses  Anesthesia:  General Endotracheal   Clinical History/Surgical Indication:  He is status post left upper lobectomy on 06/12/2011 for a stage IB (T2A, N0, M0) non-small cell lung cancer consistent with adenocarcinoma. This was a 2.0 cm tumor with visceral pleural invasion. A CT of the chest in March showed no PE but did show a new 7 mm spiculated nodule in the RUL. A follow up CT on 08/12/2015 showed no change in this nodule. His CT on 11/14/2015 showed slight interval enlargement of this nodule to 8 mm. His most recent CT scan on 01/30/2016 showed continued increase in size of the nodule to 9 mm and it was felt to be highly suspicious for bronchogenic cancer. His PET scan shows hypermetabolic activity in the RUL lung nodule with an SUV max of 12.65. There is a mildly hypermetabolic 7 mm pretracheal lymph node with an SUV of 3.7 and a 7.5 mm precarinal lymph node with an SUV of 3.7. There is an AP window lymph node with an SUV of 4.2. There is no other hypermetabolic uptake. He had PFT's on 06/21/2015 which showed a significant deterioration in lung function since his preop PFT's in 05/2011 with a diffusion capacity of only 26% predicted down from 31% and an FEV1 of 1.76, down from 2.5. His PFT's were repeated on 02/13/2016 and showed an FEV1 of 1.59 improving to 1.78 after bronchodilators. The DLCO was 23% of predicted.  He has an 10 mm spiculated nodule in the apex of the right lung that is highly suspicious for bronchogenic carcinoma in this smoker with prior lung cancer resected in 06/2011. There are a few mediastinal lymph nodes with mild hypermetabolic activity that  could be involved with metastatic disease and I think mediastinoscopy is indicated to assess this. If the lymph nodes are negative then I would consider doing a VATS wedge resection of the RUL lung nodule. He is not a candidate for lobectomy.   I will plan to do a flexible bronchoscopy and mediastinoscopy. I discussed the operative procedure with him including alternatives, benefits and risks including but not limited to bleeding, blood transfusion, injury to mediastinal structures, infection, false negative biopsy results and he understands and agrees to proceed.    Preparation:  The patient was seen in the preoperative holding area and the correct patient, correct operation were confirmed with the patient after reviewing the medical record and xrays. The consent was signed by me. Preoperative antibiotics were given. The patient was taken back to the operating room and positioned supine on the operating room table. After being placed under general endotracheal anesthesia by the anesthesia team using a single lumen tube a time out was taken and the correct patient and operation were confirmed with nursing and anesthesia staff.   Flexible Bronchoscopy:  The video bronchoscope was passed down the endotracheal tube. The distal trachea was normal. The carina was sharp. The left bronchial tree had normal segmental anatomy with no endobronchial lesions or extrinsic compression. The stump of the [name] bronchus from his prior lobectomy looked fine. The right bronchial tree had normal segmental anatomy with no endobronchial lesions or extrinsic compression.  Mediastinoscopy:  A roll was placed beneath the shoulders and  the neck slightly extended. The neck and chest were prepped with betadine soap and solution and draped in the usual sterile manner. A 2nd time out was taken and the correct patient and procedure were confirmed with nursing and anesthesia. A 2 cm incision was made horizontally just above the  sternal notch in the natural skin crease. Electrocautery was used to continue down through the subcutaneous tissue. The strap muscles were separated in the midline to expose the trachea. A pretracheal plane was developed bluntly and the mediastinoscope inserted. It was advanced along the anterior tracheal wall. There were multiple lymph nodes seen and biopsies were taken at 4 R x 2, 7, and 4 L. These were sent for permanent pathology. Hemostasis was complete at the end of the procedure. The scope was removed and the strap muscles re-approximated with interrupted 3-0 vicryl sutures. The platysma muscle was re-approximated with 3-0 vicryl suture and the skin with 4-0 vicryl subcuticular suture. Dermabond was applied. The sponge, needle, and instrument counts were correct according to the nurses. The patient was awakened, extubated and transported to the PACU in stable condition.

## 2016-03-14 ENCOUNTER — Encounter: Payer: Self-pay | Admitting: Surgery

## 2016-03-14 NOTE — Progress Notes (Signed)
I called [name] today to tell him about the mediastinal lymph node biopsies which were all negative for malignancy. I discussed the options of surgical removal of the RUL lung nodule vs continued observation and needle biopsy if it gets larger. He has an appt with me on Tuesday to discuss this further.

## 2016-03-18 ENCOUNTER — Ambulatory Visit (INDEPENDENT_AMBULATORY_CARE_PROVIDER_SITE_OTHER): Payer: Medicaid Other | Admitting: Surgery

## 2016-03-18 ENCOUNTER — Encounter: Payer: Self-pay | Admitting: Surgery

## 2016-03-18 VITALS — BP 121/81 | HR 82 | Resp 16 | Ht 68.0 in | Wt 143.0 lb

## 2016-03-18 DIAGNOSIS — Z902 Acquired absence of lung [part of]: Secondary | ICD-10-CM | POA: Diagnosis not present

## 2016-03-18 DIAGNOSIS — C3412 Malignant neoplasm of upper lobe, left bronchus or lung: Secondary | ICD-10-CM

## 2016-03-18 DIAGNOSIS — R911 Solitary pulmonary nodule: Secondary | ICD-10-CM | POA: Diagnosis not present

## 2016-03-19 ENCOUNTER — Encounter: Payer: Self-pay | Admitting: Surgery

## 2016-03-19 NOTE — Progress Notes (Signed)
HPI:  [name] returns today to discuss the results of his recent mediastinoscopy and make further plans for his enlarging RUL lung nodule. He has had no problems since his procedure. The pathology showed no malignancy in any of the specimens.  Current Outpatient Prescriptions  Medication Sig Dispense Refill  . acetaminophen (TYLENOL) 500 MG tablet Take 500 mg by mouth every 6 (six) hours as needed for mild pain.    [name] albuterol (PROVENTIL HFA;VENTOLIN HFA) 108 (90 BASE) MCG/ACT inhaler Inhale 2 puffs into the lungs every 6 (six) hours as needed for wheezing or shortness of breath.    [name] aspirin EC 81 MG tablet Take 81 mg by mouth at bedtime.    . gabapentin (NEURONTIN) 300 MG capsule Take 1 capsule (300 mg total) by mouth 3 (three) times daily. (Patient taking differently: Take 300 mg by mouth daily. ) 90 capsule 0  . guaiFENesin (MUCINEX) 600 MG 12 hr tablet Take 1 tablet (600 mg total) by mouth 2 (two) times daily. (Patient taking differently: Take 600 mg by mouth 2 (two) times daily as needed for to loosen phlegm. ) 60 tablet 0  . guaifenesin (ROBITUSSIN) 100 MG/5ML syrup Take 200 mg by mouth 3 (three) times daily as needed for cough or congestion.     [name] HYDROcodone-acetaminophen (NORCO/VICODIN) 5-325 MG tablet Take 1 tablet by mouth every 6 (six) hours as needed for moderate pain.    . methocarbamol (ROBAXIN) 500 MG tablet Take 1 tablet (500 mg total) by mouth every 6 (six) hours as needed for muscle spasms. 60 tablet 1  . omeprazole (PRILOSEC) 20 MG capsule Take 20 mg by mouth daily.    . OXYGEN Inhale 2 L into the lungs at bedtime.    [name] tiotropium (SPIRIVA) 18 MCG inhalation capsule Place 1 capsule (18 mcg total) into inhaler and inhale daily. 30 capsule 3  . triamcinolone cream (KENALOG) 0.1 % Apply 1 application topically 2 (two) times daily as needed (rash).     No current facility-administered medications for this visit.      Physical Exam: BP 121/81 (BP Location: Left Arm,  Patient Position: Sitting, Cuff Size: Normal)   Pulse 82   Resp 16   Ht '5\' 8"'$  (1.727 m)   Wt 143 lb (64.9 kg)   SpO2 95% Comment: ON RA  BMI 21.74 kg/m  The neck incision is healing well  Diagnostic Tests:  All mediastinal lymph node samples showed no malignancy  Impression:  He has an 10 mm spiculated nodule in the apex of the right lung that is highly suspicious for bronchogenic carcinoma in this smoker with prior lung cancer resected in 06/2011. There are a few mediastinal lymph nodes with mild hypermetabolic activity that are negative for malignancy and could be inflammatory due to his continued smoking. The right apical lung nodule appears to be amenable to a wedge resection. I don't think his PFT's are good enough to allow lobectomy. I think a wedge resection is a better option for this relatively young man than XRT. I discussed the options for treatment with him including wedge resection, XRT, and conservative following. I discussed the risks of surgery including but not limited to bleeding, air leak, positive surgical margin, recurrent cancer and respiratory complications. He understands and agrees to proceed.  Plan:  He will call to schedule right VATS, possible thoracotomy for wedge resection of the right apical lung nodule.   Gaye Pollack, MD [facility] [phone])  [phone]      

## 2016-04-11 ENCOUNTER — Other Ambulatory Visit: Payer: Self-pay | Admitting: Surgery

## 2016-04-11 DIAGNOSIS — C3490 Malignant neoplasm of unspecified part of unspecified bronchus or lung: Secondary | ICD-10-CM

## 2016-05-12 ENCOUNTER — Other Ambulatory Visit: Payer: Self-pay | Admitting: Surgery

## 2016-05-13 ENCOUNTER — Encounter: Payer: Self-pay | Admitting: Emergency Medicine

## 2016-05-13 ENCOUNTER — Ambulatory Visit (INDEPENDENT_AMBULATORY_CARE_PROVIDER_SITE_OTHER): Payer: Medicaid Other | Admitting: Emergency Medicine

## 2016-05-13 DIAGNOSIS — Z720 Tobacco use: Secondary | ICD-10-CM | POA: Diagnosis not present

## 2016-05-13 DIAGNOSIS — J449 Chronic obstructive pulmonary disease, unspecified: Secondary | ICD-10-CM

## 2016-05-13 DIAGNOSIS — R911 Solitary pulmonary nodule: Secondary | ICD-10-CM

## 2016-05-13 MED ORDER — UMECLIDINIUM-VILANTEROL 62.5-25 MCG/INH IN AEPB: 1.0000 | INHALATION_SPRAY | Freq: Every day | RESPIRATORY_TRACT | 0 refills | Status: DC

## 2016-05-13 NOTE — Progress Notes (Signed)
Patient seen in the office today and instructed on use of Anoro.  Patient expressed understanding and demonstrated technique.TA/CMA

## 2016-05-13 NOTE — Assessment & Plan Note (Signed)
High risk for a second primary (vs recurrence) . Follow with Dr Cyndia Bent as planned to discuss possible treatment of your right lung nodule.

## 2016-05-13 NOTE — Assessment & Plan Note (Signed)
Discussed cessation today. Plan for decreasing and then hopefully setting a quit date reviewed.
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
## 2016-05-13 NOTE — Addendum Note (Signed)
Addended by: Jannette Spanner on: 05/13/2016 03:04 PM   Modules accepted: Orders

## 2016-05-13 NOTE — Patient Instructions (Signed)
We will stop Spiriva for now. Start Anoro one inhalation daily to see if you benefit. If you prefer this medication then we will order through your pharmacy. Call us to let us know.   Take albuterol 2 puffs up to every 4 hours if needed for shortness of breath.  Follow with Dr Cyndia Bent as planned to discuss possible treatment of your right lung nodule.  Work hard on decreasing your cigarettes as we discussed. Best case situation would be to stop completely 3 weeks prior to any lung surgery.  Follow with Dr Lamonte Sakai in 3 months or sooner if you have any problems.

## 2016-05-13 NOTE — Assessment & Plan Note (Signed)
>>  ASSESSMENT AND PLAN FOR NODULE OF RIGHT LUNG WRITTEN ON 05/13/2016  2:36 PM BY BYRUM, Les Pou, MD  High risk for a second primary (vs recurrence) . Follow with Dr Laneta Simmers as planned to discuss possible treatment of your right lung nodule.

## 2016-05-13 NOTE — Assessment & Plan Note (Addendum)
>>  ASSESSMENT AND PLAN FOR COPD WITH EMPHYSEMA (HCC) WRITTEN ON 05/13/2016  2:35 PM BY Franchesca Veneziano, Les Pou, MD  We will stop Spiriva for now. Start Anoro one inhalation daily to see if you benefit. If you prefer this medication then we will order through your pharmacy. Call us to let us know.   Take albuterol 2 puffs up to every 4 hours if needed for shortness of breath.  Work hard on decreasing your cigarettes as we discussed. Best case situation would be to stop completely 3 weeks prior to any lung surgery.  Follow with Dr Delton Coombes in 3 months or sooner if you have any problems  >>ASSESSMENT AND PLAN FOR CHRONIC RESPIRATORY FAILURE WITH HYPOXIA (HCC) WRITTEN ON 11/05/2022  4:57 PM BY TAWKALIYAR, ROYA, DO  >>ASSESSMENT AND PLAN FOR MULTIPLE PULMONARY NODULES WRITTEN ON 09/19/2022  8:11 AM BY DEAN, EMILY, DO  >>ASSESSMENT AND PLAN FOR NODULE OF RIGHT LUNG WRITTEN ON 05/13/2016  2:36 PM BY Kellina Dreese S, MD  High risk for a second primary (vs recurrence) . Follow with Dr Laneta Simmers as planned to discuss possible treatment of your right lung nodule.

## 2016-05-13 NOTE — Assessment & Plan Note (Signed)
>>  ASSESSMENT AND PLAN FOR MULTIPLE PULMONARY NODULES WRITTEN ON 09/19/2022  8:11 AM BY DEAN, EMILY, DO  >>ASSESSMENT AND PLAN FOR NODULE OF RIGHT LUNG WRITTEN ON 05/13/2016  2:36 PM BY BYRUM, Les Pou, MD  High risk for a second primary (vs recurrence) . Follow with Dr Laneta Simmers as planned to discuss possible treatment of your right lung nodule.

## 2016-05-21 ENCOUNTER — Ambulatory Visit
Admission: RE | Admit: 2016-05-21 | Discharge: 2016-05-21 | Disposition: A | Discharge status: Home / Self Care | Payer: Medicaid Other | Source: Ambulatory Visit | Attending: Surgery | Admitting: Surgery

## 2016-05-21 ENCOUNTER — Encounter: Payer: Self-pay | Admitting: Surgery

## 2016-05-21 ENCOUNTER — Other Ambulatory Visit: Payer: Self-pay | Admitting: *Deleted

## 2016-05-21 ENCOUNTER — Ambulatory Visit (INDEPENDENT_AMBULATORY_CARE_PROVIDER_SITE_OTHER): Payer: Medicaid Other | Admitting: Surgery

## 2016-05-21 VITALS — BP 94/64 | HR 60 | Resp 20 | Ht 69.0 in | Wt 146.0 lb

## 2016-05-21 DIAGNOSIS — C3412 Malignant neoplasm of upper lobe, left bronchus or lung: Secondary | ICD-10-CM

## 2016-05-21 DIAGNOSIS — R911 Solitary pulmonary nodule: Secondary | ICD-10-CM | POA: Diagnosis not present

## 2016-05-21 DIAGNOSIS — C3490 Malignant neoplasm of unspecified part of unspecified bronchus or lung: Secondary | ICD-10-CM

## 2016-05-21 NOTE — Progress Notes (Signed)
HPI:  The patient returns today to follow up on an enlarging spiculated RUL nodule that is very suspicious for lung cancer. To summarize, he is status post left upper lobectomy on 06/12/2011 for a stage IB (T2A, N0, M0) non-small cell lung cancer consistent with adenocarcinoma. This was a 2.0 cm tumor with visceral pleural invasion. A CT of the chest in March showed no PE but did show a new 7 mm spiculated nodule in the RUL. A follow up CT on 08/12/2015 showed no change in this nodule. His CT on 11/14/2015 showed slight interval enlargement of this nodule to 8 mm. His most recent CT scan on 01/30/2016 showed continued increase in size of the nodule to 9 mm and it was felt to be highly suspicious for bronchogenic cancer. His PET scan shows hypermetabolic activity in the RUL lung nodule with an SUV max of 12.65. There is a mildly hypermetabolic 7 mm pretracheal lymph node with an SUV of 3.7 and a 7.5 mm precarinal lymph node with an SUV of 3.7. There is an AP window lymph node with an SUV of 4.2. There is no other hypermetabolic uptake.He had PFT's on 06/21/2015 which showed a significant deterioration in lung function since his preop PFT's in 05/2011 with a diffusion capacity of only 26% predicted down from 31% and an FEV1 of 1.76, down from 2.5. His PFT's were repeated on 02/13/2016 and showed an FEV1 of 1.59 improving to 1.78 after bronchodilators. The DLCO was 23% of predicted. He underwent mediastinoscopy on 03/10/2016 and all of the specimens were negative for malignancy. I recommended surgical resection by wedge resection at that time and he wanted to think about it for a while. He continues to smoke 1/2 ppd cigarettes.    Current Outpatient Prescriptions  Medication Sig Dispense Refill  . acetaminophen (TYLENOL) 500 MG tablet Take 500 mg by mouth every 6 (six) hours as needed for mild pain.    [name] albuterol (PROVENTIL HFA;VENTOLIN HFA) 108 (90 BASE) MCG/ACT inhaler Inhale 2 puffs into the lungs  every 6 (six) hours as needed for wheezing or shortness of breath.    [name] aspirin EC 81 MG tablet Take 81 mg by mouth at bedtime.    . gabapentin (NEURONTIN) 300 MG capsule Take 1 capsule (300 mg total) by mouth 3 (three) times daily. (Patient taking differently: Take 300 mg by mouth daily. ) 90 capsule 0  . guaiFENesin (MUCINEX) 600 MG 12 hr tablet Take 1 tablet (600 mg total) by mouth 2 (two) times daily. (Patient taking differently: Take 600 mg by mouth 2 (two) times daily as needed for to loosen phlegm. ) 60 tablet 0  . guaifenesin (ROBITUSSIN) 100 MG/5ML syrup Take 200 mg by mouth 3 (three) times daily as needed for cough or congestion.     [name] HYDROcodone-acetaminophen (NORCO/VICODIN) 5-325 MG tablet Take 1 tablet by mouth every 6 (six) hours as needed for moderate pain.    . methocarbamol (ROBAXIN) 500 MG tablet Take 1 tablet (500 mg total) by mouth every 6 (six) hours as needed for muscle spasms. 60 tablet 1  . omeprazole (PRILOSEC) 20 MG capsule Take 20 mg by mouth daily.    . OXYGEN Inhale 2 L into the lungs at bedtime.    [name] tiotropium (SPIRIVA) 18 MCG inhalation capsule Place 1 capsule (18 mcg total) into inhaler and inhale daily. 30 capsule 3  . triamcinolone cream (KENALOG) 0.1 % Apply 1 application topically 2 (two) times daily as needed (rash).    [name]  umeclidinium-vilanterol (ANORO ELLIPTA) 62.5-25 MCG/INH AEPB Inhale 1 puff into the lungs daily. 1 each 0   No current facility-administered medications for this visit.      Physical Exam: BP 94/64   Pulse 60   Resp 20   Ht '5\' 9"'$  (1.753 m)   Wt 146 lb (66.2 kg)   SpO2 99% Comment: RA  BMI 21.56 kg/m  He looks well Lung exam shows decreased breath sounds throughout. There is no cervical or supraclavicular adenopathy.  Diagnostic Tests:  CLINICAL DATA:  Hypermetabolic right apical nodule. Recent mediastinoscopy demonstrated no carcinoma in 4 biopsied mediastinal lymph nodes.  EXAM: CT CHEST WITHOUT  CONTRAST  TECHNIQUE: Multidetector CT imaging of the chest was performed following the standard protocol without IV contrast.  COMPARISON:  PET-CT 02/26/2016.  Chest CT 01/30/2016  FINDINGS: Cardiovascular: The heart size is normal. No pericardial effusion. Coronary artery calcification is noted. Atherosclerotic calcification is noted in the wall of the thoracic aorta.  Mediastinum/Nodes: 8 mm short axis precarinal lymph node (image 58 series 3) is unchanged since 01/30/2016. 8 mm lymph node in the AP window (image 54 series 3) is also stable since 01/30/2016. Nonenlarged subcarinal lymph node is unchanged. There is no axillary lymphadenopathy. The esophagus has normal imaging features.  Lungs/Pleura: Hypermetabolic nodule in the medial right apex has clearly progressed since 01/30/2016. The lesion shape is also different in the interval. Image 29 series 4 shows lesion measurements of 12 x 11 mm on today's study. This compares to 9 x 9 mm when I remeasure it on the 01/30/2016 exam.  Emphysema again noted in both lungs. There is some chronic atelectasis or scarring in the medial right middle lobe, unchanged. No suspicious pulmonary nodule or mass in the remaining left lung.  Upper Abdomen: Unremarkable.  Musculoskeletal: Bone windows reveal no worrisome lytic or sclerotic osseous lesions.  IMPRESSION: 1. Interval progression of the lobular nodule in the medial aspect of the right lung apex seen to be hypermetabolic on recent CT scan. Imaging features remain compatible with bronchogenic neoplasm. 2. Stable appearance of upper normal mediastinal lymph nodes. 3.  Emphysema. (ICD10-J43.9) 4. Coronary artery and thoracic aortic atherosclerosis.   Electronically Signed   By: Misty Stanley M.D.   On: 05/21/2016 11:36   Impression:  The RUL nodule has continued to enlarge but it still relatively small. I think surgical resection is the best option. He is a  candidate for a wedge resection but not lobectomy. I have personally reviewed and  interpreted the CT scan today and reviewed the images with him and his friend. I discussed the surgical procedure of wedge resection by VATS or thoracotomy, alternative of needle biopsy and XRT, benefits and risks including but not limited to bleeding, infection, positive surgical margin requiring postop XRT, respiratory failure, and recurrent cancer. He understands and agrees to proceed. He wants to wait until the end of May and understands the risk of further enlargement making resection with a negative margin more difficult and risk of metastatic disease.   Plan:  Right VATS or thoracotomy for wedge resection of RUL lung nodule on Thursday 07/03/2016   Gaye Pollack, MD [facility] [phone]

## 2016-05-26 ENCOUNTER — Telehealth: Payer: Self-pay | Admitting: Emergency Medicine

## 2016-05-26 MED ORDER — UMECLIDINIUM-VILANTEROL 62.5-25 MCG/INH IN AEPB: 1.0000 | INHALATION_SPRAY | Freq: Every day | RESPIRATORY_TRACT | 6 refills | Status: DC

## 2016-05-26 NOTE — Telephone Encounter (Signed)
Spoke with pt, who states he has noticed improvement with anoro. Rx has been sent to preferred pharmacy. Nothing further needed.

## 2016-05-27 ENCOUNTER — Telehealth: Payer: Self-pay | Admitting: Emergency Medicine

## 2016-05-27 DIAGNOSIS — J449 Chronic obstructive pulmonary disease, unspecified: Secondary | ICD-10-CM

## 2016-05-27 NOTE — Telephone Encounter (Signed)
Spoke with Corene Cornea at [facility], who states pt needs to be requalified for 02. Corene Cornea states [facility] has records of pt being on 2L cont. Pt seen RB on 05/13/16, according to RB's note it appears that pt just wears O2 qhs.   RB please advise if you would like to order ONO to requalify pt. Thanks.

## 2016-05-28 NOTE — Telephone Encounter (Signed)
Pt has been scheduled for qualifying on 05/30/16 at 3:00. ONO on room air has been ordered. Pt is aware and voiced his understanding. Nothing further needed.

## 2016-05-28 NOTE — Telephone Encounter (Signed)
Please have [facility] perform an ONO on RA and also a walking oximetry to see if he desats, qualifies for exertional O2. Thanks.

## 2016-05-30 ENCOUNTER — Ambulatory Visit (INDEPENDENT_AMBULATORY_CARE_PROVIDER_SITE_OTHER): Payer: Medicaid Other | Admitting: *Deleted

## 2016-05-30 DIAGNOSIS — J449 Chronic obstructive pulmonary disease, unspecified: Secondary | ICD-10-CM

## 2016-05-30 NOTE — Progress Notes (Signed)
02 qualifying walk test performed today per RB's recommendations.

## 2016-06-08 ENCOUNTER — Encounter: Payer: Self-pay | Admitting: Pulmonary Disease

## 2016-06-12 ENCOUNTER — Telehealth: Payer: Self-pay | Admitting: Emergency Medicine

## 2016-06-12 NOTE — Telephone Encounter (Signed)
[id number] x1 

## 2016-06-13 NOTE — Telephone Encounter (Signed)
Called and spoke with stephanie from [facility] and she stated that they did receive the order and everything is perfect.  Nothing further is needed.

## 2016-06-20 ENCOUNTER — Telehealth: Payer: Self-pay | Admitting: Pulmonary Disease

## 2016-06-20 NOTE — Telephone Encounter (Signed)
Opened in error

## 2016-07-01 ENCOUNTER — Encounter (HOSPITAL_COMMUNITY)
Admission: RE | Admit: 2016-07-01 | Discharge: 2016-07-01 | Disposition: A | Discharge status: Home / Self Care | Payer: Medicaid Other | Source: Ambulatory Visit | Attending: Surgery | Admitting: Surgery

## 2016-07-01 ENCOUNTER — Ambulatory Visit (HOSPITAL_COMMUNITY)
Admission: RE | Admit: 2016-07-01 | Discharge: 2016-07-01 | Disposition: A | Discharge status: Home / Self Care | Payer: Medicaid Other | Source: Ambulatory Visit | Attending: Surgery | Admitting: Surgery

## 2016-07-01 ENCOUNTER — Encounter (HOSPITAL_COMMUNITY): Payer: Self-pay

## 2016-07-01 ENCOUNTER — Other Ambulatory Visit: Payer: Self-pay

## 2016-07-01 DIAGNOSIS — R9431 Abnormal electrocardiogram [ECG] [EKG]: Secondary | ICD-10-CM | POA: Diagnosis not present

## 2016-07-01 DIAGNOSIS — Z01818 Encounter for other preprocedural examination: Secondary | ICD-10-CM | POA: Insufficient documentation

## 2016-07-01 DIAGNOSIS — Z0183 Encounter for blood typing: Secondary | ICD-10-CM | POA: Insufficient documentation

## 2016-07-01 DIAGNOSIS — Z01812 Encounter for preprocedural laboratory examination: Secondary | ICD-10-CM | POA: Insufficient documentation

## 2016-07-01 DIAGNOSIS — R001 Bradycardia, unspecified: Secondary | ICD-10-CM | POA: Insufficient documentation

## 2016-07-01 DIAGNOSIS — R911 Solitary pulmonary nodule: Secondary | ICD-10-CM | POA: Insufficient documentation

## 2016-07-01 HISTORY — DX: Acute myocardial infarction, unspecified: I21.9

## 2016-07-01 LAB — URINALYSIS, ROUTINE W REFLEX MICROSCOPIC
Bilirubin Urine: NEGATIVE
Glucose, UA: NEGATIVE mg/dL
Hgb urine dipstick: NEGATIVE
Ketones, ur: NEGATIVE mg/dL
Leukocytes, UA: NEGATIVE
Nitrite: NEGATIVE
Protein, ur: NEGATIVE mg/dL
Specific Gravity, Urine: 1.024 (ref 1.005–1.030)
pH: 5 (ref 5.0–8.0)

## 2016-07-01 LAB — COMPREHENSIVE METABOLIC PANEL
ALT: 13 U/L — ABNORMAL LOW (ref 17–63)
AST: 17 U/L (ref 15–41)
Albumin: 4 g/dL (ref 3.5–5.0)
Alkaline Phosphatase: 92 U/L (ref 38–126)
Anion gap: 8 (ref 5–15)
BUN: 13 mg/dL (ref 6–20)
CO2: 20 mmol/L — ABNORMAL LOW (ref 22–32)
Calcium: 9.3 mg/dL (ref 8.9–10.3)
Chloride: 111 mmol/L (ref 101–111)
Creatinine, Ser: 0.98 mg/dL (ref 0.61–1.24)
GFR calc Af Amer: >60 mL/min (ref >60)
GFR calc non Af Amer: >60 mL/min (ref >60)
Glucose, Bld: 96 mg/dL (ref 65–99)
Potassium: 4.2 mmol/L (ref 3.5–5.1)
Sodium: 139 mmol/L (ref 135–145)
Total Bilirubin: 0.9 mg/dL (ref 0.3–1.2)
Total Protein: 7.1 g/dL (ref 6.5–8.1)

## 2016-07-01 LAB — PROTIME-INR
INR: 1.03
Prothrombin Time: 13.6 seconds (ref 11.4–15.2)

## 2016-07-01 LAB — CBC
HCT: 44.8 % (ref 39.0–52.0)
Hemoglobin: 14.7 g/dL (ref 13.0–17.0)
MCH: 30.8 pg (ref 26.0–34.0)
MCHC: 32.8 g/dL (ref 30.0–36.0)
MCV: 93.7 fL (ref 78.0–100.0)
Platelets: 273 10*3/uL (ref 150–400)
RBC: 4.78 MIL/uL (ref 4.22–5.81)
RDW: 15 % (ref 11.5–15.5)
WBC: 5.8 10*3/uL (ref 4.0–10.5)

## 2016-07-01 LAB — SURGICAL PCR SCREEN
MRSA, PCR: NEGATIVE
Staphylococcus aureus: NEGATIVE

## 2016-07-01 LAB — TYPE AND SCREEN
ABO/RH(D): O POS
Antibody Screen: NEGATIVE

## 2016-07-01 LAB — BLOOD GAS, ARTERIAL
Acid-base deficit: 2 mmol/L (ref 0.0–2.0)
Bicarbonate: 22 mmol/L (ref 20.0–28.0)
Drawn by: 470591
O2 Content: 2 L/min
O2 Saturation: 96.6 %
Patient temperature: 98.6
pCO2 arterial: 35.9 mmHg (ref 32.0–48.0)
pH, Arterial: 7.405 (ref 7.350–7.450)
pO2, Arterial: 85.3 mmHg (ref 83.0–108.0)

## 2016-07-01 LAB — APTT: aPTT: 29 seconds (ref 24–36)

## 2016-07-01 NOTE — Pre-Procedure Instructions (Signed)
Jameel Quant Abshier  07/01/2016      [place], [place] - [address] [place] [address] Phone: [phone] Fax: [phone]   Your procedure is scheduled on May 31 at 730 AM. Report to [place] at 530 AM.  Call this number if you have problems the morning of [phone]   Remember:  Do not eat food or drink liquids after midnight.  Take these medicines the morning of surgery with A SIP OF WATER acetaminophen (tylenol), albuterol (proventil HFA) inhaler <bring inhalers with you>, gabapentin (neurontin), guaifenesin (mucinex), hydrocodone (norco)-if needed for pain), methocarbamol (robaxin) -if needed for muscle spasms, omeprazole (prilosec) tiotropium (spiriva) inhaler, umeclidinium-vilanterol (Anoro ELLipta) inhaler.  7 days prior to surgery STOP taking any Aspirin (unless instructed otherwise by your surgeon), Aleve, Naproxen, Ibuprofen, Motrin, Advil, Goody's, BC's, all herbal medications, fish oil, and all vitamins   Do not wear jewelry, make-up or nail polish.  Do not wear lotions, powders, or perfumes, or deoderant.  Do not shave 48 hours prior to surgery.  Men may shave face and neck.  Do not bring valuables to the hospital.  [facility] is not responsible for any belongings or valuables.  Contacts, dentures or bridgework may not be worn into surgery.  Leave your suitcase in the car.  After surgery it may be brought to your room.  For patients admitted to the hospital, discharge time will be determined by your treatment team.  Patients discharged the day of surgery will not be allowed to drive home.    Special instructions:   - Preparing For Surgery  Before surgery, you can play an important role. Because skin is not sterile, your skin needs to be as free of germs as possible. You can reduce the number of germs on your skin by washing with CHG (chlorahexidine gluconate)  Soap before surgery.  CHG is an antiseptic cleaner which kills germs and bonds with the skin to continue killing germs even after washing.  Please do not use if you have an allergy to CHG or antibacterial soaps. If your skin becomes reddened/irritated stop using the CHG.  Do not shave (including legs and underarms) for at least 48 hours prior to first CHG shower. It is OK to shave your face.  Please follow these instructions carefully.   1. Shower the NIGHT BEFORE SURGERY and the MORNING OF SURGERY with CHG.   2. If you chose to wash your hair, wash your hair first as usual with your normal shampoo.  3. After you shampoo, rinse your hair and body thoroughly to remove the shampoo.  4. Use CHG as you would any other liquid soap. You can apply CHG directly to the skin and wash gently with a scrungie or a clean washcloth.   5. Apply the CHG Soap to your body ONLY FROM THE NECK DOWN.  Do not use on open wounds or open sores. Avoid contact with your eyes, ears, mouth and genitals (private parts). Wash genitals (private parts) with your normal soap.  6. Wash thoroughly, paying special attention to the area where your surgery will be performed.  7. Thoroughly rinse your body with warm water from the neck down.  8. DO NOT shower/wash with your normal soap after using and rinsing off the CHG Soap.  9. Pat yourself dry with a CLEAN TOWEL.   10. Wear CLEAN PAJAMAS   11. Place CLEAN SHEETS on your bed the  night of your first shower and DO NOT SLEEP WITH PETS.    Day of Surgery: Do not apply any deodorants/lotions. Please wear clean clothes to the hospital/surgery center.     Please read over the following fact sheets that you were given. Pain Booklet, Coughing and Deep Breathing, MRSA Information and Surgical Site Infection Prevention

## 2016-07-01 NOTE — Progress Notes (Addendum)
[id number], Milford Cage, NP  Cardiologist: Dr. Gilford Raid  EKG: 03/2016 in [facility]  Stress test:05/2011  ECHO: 06/2014  Cardiac Cath: 06/2011  Chest x-ray: 03/2016 in [facility]
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
## 2016-07-03 ENCOUNTER — Inpatient Hospital Stay (HOSPITAL_COMMUNITY)
Admission: RE | Admit: 2016-07-03 | Discharge: 2016-07-06 | DRG: 165 | Disposition: A | Discharge status: Home / Self Care | Payer: Medicaid Other | Source: Ambulatory Visit | Attending: Surgery | Admitting: Surgery

## 2016-07-03 ENCOUNTER — Encounter (HOSPITAL_COMMUNITY)
Admission: RE | Disposition: A | Discharge status: Home / Self Care | Payer: Self-pay | Source: Ambulatory Visit | Attending: Surgery

## 2016-07-03 ENCOUNTER — Inpatient Hospital Stay (HOSPITAL_COMMUNITY): Payer: Medicaid Other | Admitting: Certified Registered Nurse Anesthetist

## 2016-07-03 ENCOUNTER — Encounter (HOSPITAL_COMMUNITY): Payer: Self-pay | Admitting: Certified Registered Nurse Anesthetist

## 2016-07-03 ENCOUNTER — Inpatient Hospital Stay (HOSPITAL_COMMUNITY): Payer: Medicaid Other

## 2016-07-03 DIAGNOSIS — G4700 Insomnia, unspecified: Secondary | ICD-10-CM | POA: Diagnosis present

## 2016-07-03 DIAGNOSIS — R911 Solitary pulmonary nodule: Secondary | ICD-10-CM | POA: Diagnosis present

## 2016-07-03 DIAGNOSIS — F17210 Nicotine dependence, cigarettes, uncomplicated: Secondary | ICD-10-CM | POA: Diagnosis present

## 2016-07-03 DIAGNOSIS — Z79899 Other long term (current) drug therapy: Secondary | ICD-10-CM | POA: Diagnosis not present

## 2016-07-03 DIAGNOSIS — R590 Localized enlarged lymph nodes: Secondary | ICD-10-CM | POA: Diagnosis present

## 2016-07-03 DIAGNOSIS — Z9889 Other specified postprocedural states: Secondary | ICD-10-CM

## 2016-07-03 DIAGNOSIS — E785 Hyperlipidemia, unspecified: Secondary | ICD-10-CM | POA: Diagnosis present

## 2016-07-03 DIAGNOSIS — I252 Old myocardial infarction: Secondary | ICD-10-CM

## 2016-07-03 DIAGNOSIS — Z9981 Dependence on supplemental oxygen: Secondary | ICD-10-CM

## 2016-07-03 DIAGNOSIS — Z7982 Long term (current) use of aspirin: Secondary | ICD-10-CM

## 2016-07-03 DIAGNOSIS — K219 Gastro-esophageal reflux disease without esophagitis: Secondary | ICD-10-CM | POA: Diagnosis present

## 2016-07-03 DIAGNOSIS — I2510 Atherosclerotic heart disease of native coronary artery without angina pectoris: Secondary | ICD-10-CM | POA: Diagnosis present

## 2016-07-03 DIAGNOSIS — Z902 Acquired absence of lung [part of]: Secondary | ICD-10-CM

## 2016-07-03 DIAGNOSIS — J439 Emphysema, unspecified: Secondary | ICD-10-CM | POA: Diagnosis present

## 2016-07-03 DIAGNOSIS — Z85118 Personal history of other malignant neoplasm of bronchus and lung: Secondary | ICD-10-CM | POA: Diagnosis not present

## 2016-07-03 DIAGNOSIS — Z7951 Long term (current) use of inhaled steroids: Secondary | ICD-10-CM | POA: Diagnosis not present

## 2016-07-03 DIAGNOSIS — R0602 Shortness of breath: Secondary | ICD-10-CM | POA: Diagnosis present

## 2016-07-03 HISTORY — PX: VIDEO ASSISTED THORACOSCOPY (VATS)/WEDGE RESECTION: SHX6174

## 2016-07-03 SURGERY — VIDEO ASSISTED THORACOSCOPY (VATS)/WEDGE RESECTION
Anesthesia: General | Site: Chest | Laterality: Right

## 2016-07-03 MED ORDER — LEVALBUTEROL HCL 0.63 MG/3ML IN NEBU
0.6300 mg | INHALATION_SOLUTION | Freq: Four times a day (QID) | RESPIRATORY_TRACT | Status: DC
  Administered 2016-07-03: 0.63 mg via RESPIRATORY_TRACT
  Filled 2016-07-03: qty 3

## 2016-07-03 MED ORDER — ENSURE ENLIVE PO LIQD
237.0000 mL | Freq: Two times a day (BID) | ORAL | Status: DC
  Administered 2016-07-04: 237 mL via ORAL

## 2016-07-03 MED ORDER — ONDANSETRON HCL 4 MG/2ML IJ SOLN
INTRAMUSCULAR | Status: AC
  Filled 2016-07-03: qty 2

## 2016-07-03 MED ORDER — PHENYLEPHRINE 40 MCG/ML (10ML) SYRINGE FOR IV PUSH (FOR BLOOD PRESSURE SUPPORT)
PREFILLED_SYRINGE | INTRAVENOUS | Status: DC | PRN
  Administered 2016-07-03: 80 ug via INTRAVENOUS
  Administered 2016-07-03: 40 ug via INTRAVENOUS

## 2016-07-03 MED ORDER — ORAL CARE MOUTH RINSE
15.0000 mL | Freq: Two times a day (BID) | OROMUCOSAL | Status: DC
  Administered 2016-07-03 – 2016-07-05 (×3): 15 mL via OROMUCOSAL

## 2016-07-03 MED ORDER — METOCLOPRAMIDE HCL 5 MG/ML IJ SOLN
10.0000 mg | Freq: Four times a day (QID) | INTRAMUSCULAR | Status: AC
  Administered 2016-07-03 – 2016-07-04 (×4): 10 mg via INTRAVENOUS
  Filled 2016-07-03 (×4): qty 2

## 2016-07-03 MED ORDER — DEXTROSE 5 % IV SOLN
INTRAVENOUS | Status: DC | PRN
  Administered 2016-07-03: 20 ug/min via INTRAVENOUS

## 2016-07-03 MED ORDER — DIPHENHYDRAMINE HCL 12.5 MG/5ML PO ELIX: 12.5000 mg | ORAL_SOLUTION | Freq: Four times a day (QID) | ORAL | Status: DC | PRN

## 2016-07-03 MED ORDER — ONDANSETRON HCL 4 MG/2ML IJ SOLN: 4.0000 mg | Freq: Four times a day (QID) | INTRAMUSCULAR | Status: DC | PRN

## 2016-07-03 MED ORDER — ACETAMINOPHEN 160 MG/5ML PO SOLN: 1000.0000 mg | Freq: Four times a day (QID) | ORAL | Status: DC

## 2016-07-03 MED ORDER — POTASSIUM CHLORIDE 10 MEQ/50ML IV SOLN: 10.0000 meq | Freq: Every day | INTRAVENOUS | Status: DC | PRN

## 2016-07-03 MED ORDER — SENNOSIDES-DOCUSATE SODIUM 8.6-50 MG PO TABS
1.0000 | ORAL_TABLET | Freq: Every day | ORAL | Status: DC
  Administered 2016-07-03 – 2016-07-05 (×3): 1 via ORAL
  Filled 2016-07-03 (×3): qty 1

## 2016-07-03 MED ORDER — PROPOFOL 10 MG/ML IV BOLUS
INTRAVENOUS | Status: DC | PRN
  Administered 2016-07-03: 130 mg via INTRAVENOUS

## 2016-07-03 MED ORDER — HYDROMORPHONE 1 MG/ML IV SOLN
INTRAVENOUS | Status: DC
  Administered 2016-07-03: 0.9 mg via INTRAVENOUS
  Administered 2016-07-03: 25 mg via INTRAVENOUS
  Administered 2016-07-04: 0.6 mg via INTRAVENOUS
  Administered 2016-07-04 (×2): 0 mg via INTRAVENOUS
  Administered 2016-07-04 – 2016-07-05 (×2): 0.6 mg via INTRAVENOUS
  Administered 2016-07-05: 1.2 mg via INTRAVENOUS
  Administered 2016-07-05: 1.5 mg via INTRAVENOUS
  Filled 2016-07-03 (×2): qty 25

## 2016-07-03 MED ORDER — PHYSOSTIGMINE SALICYLATE 1 MG/ML IJ SOLN
INTRAMUSCULAR | Status: AC
  Filled 2016-07-03: qty 2

## 2016-07-03 MED ORDER — NALOXONE HCL 0.4 MG/ML IJ SOLN: 0.4000 mg | INTRAMUSCULAR | Status: DC | PRN

## 2016-07-03 MED ORDER — ROCURONIUM BROMIDE 10 MG/ML (PF) SYRINGE
PREFILLED_SYRINGE | INTRAVENOUS | Status: DC | PRN
  Administered 2016-07-03: 10 mg via INTRAVENOUS
  Administered 2016-07-03: 50 mg via INTRAVENOUS

## 2016-07-03 MED ORDER — SUGAMMADEX SODIUM 200 MG/2ML IV SOLN
INTRAVENOUS | Status: AC
  Filled 2016-07-03: qty 2

## 2016-07-03 MED ORDER — 0.9 % SODIUM CHLORIDE (POUR BTL) OPTIME
TOPICAL | Status: DC | PRN
  Administered 2016-07-03 (×2): 1000 mL

## 2016-07-03 MED ORDER — ENOXAPARIN SODIUM 40 MG/0.4ML ~~LOC~~ SOLN
40.0000 mg | Freq: Every day | SUBCUTANEOUS | Status: DC
  Administered 2016-07-04 – 2016-07-05 (×2): 40 mg via SUBCUTANEOUS
  Filled 2016-07-03 (×2): qty 0.4

## 2016-07-03 MED ORDER — LEVALBUTEROL HCL 0.63 MG/3ML IN NEBU
0.6300 mg | INHALATION_SOLUTION | Freq: Three times a day (TID) | RESPIRATORY_TRACT | Status: DC
  Administered 2016-07-04 – 2016-07-06 (×7): 0.63 mg via RESPIRATORY_TRACT
  Filled 2016-07-03 (×8): qty 3

## 2016-07-03 MED ORDER — LIDOCAINE 2% (20 MG/ML) 5 ML SYRINGE
INTRAMUSCULAR | Status: AC
  Filled 2016-07-03: qty 5

## 2016-07-03 MED ORDER — PROPOFOL 10 MG/ML IV BOLUS
INTRAVENOUS | Status: AC
  Filled 2016-07-03: qty 20

## 2016-07-03 MED ORDER — ROCURONIUM BROMIDE 10 MG/ML (PF) SYRINGE
PREFILLED_SYRINGE | INTRAVENOUS | Status: AC
  Filled 2016-07-03: qty 5

## 2016-07-03 MED ORDER — SODIUM CHLORIDE 0.9% FLUSH
9.0000 mL | INTRAVENOUS | Status: DC | PRN
  Administered 2016-07-04: 9 mL via INTRAVENOUS
  Filled 2016-07-03: qty 9

## 2016-07-03 MED ORDER — LACTATED RINGERS IV SOLN
INTRAVENOUS | Status: DC | PRN
  Administered 2016-07-03: 07:00:00 via INTRAVENOUS
  Administered 2016-07-03: 16:00:00

## 2016-07-03 MED ORDER — DEXTROSE-NACL 5-0.9 % IV SOLN
INTRAVENOUS | Status: DC
  Administered 2016-07-03: 18:00:00 via INTRAVENOUS

## 2016-07-03 MED ORDER — ONDANSETRON HCL 4 MG/2ML IJ SOLN
INTRAMUSCULAR | Status: DC | PRN
  Administered 2016-07-03: 4 mg via INTRAVENOUS

## 2016-07-03 MED ORDER — METHOCARBAMOL 500 MG PO TABS: 500.0000 mg | ORAL_TABLET | Freq: Four times a day (QID) | ORAL | Status: DC | PRN

## 2016-07-03 MED ORDER — LIDOCAINE 2% (20 MG/ML) 5 ML SYRINGE
INTRAMUSCULAR | Status: DC | PRN
  Administered 2016-07-03: 100 mg via INTRAVENOUS

## 2016-07-03 MED ORDER — UMECLIDINIUM-VILANTEROL 62.5-25 MCG/INH IN AEPB
1.0000 | INHALATION_SPRAY | Freq: Every day | RESPIRATORY_TRACT | Status: DC
  Administered 2016-07-04 – 2016-07-06 (×3): 1 via RESPIRATORY_TRACT
  Filled 2016-07-03: qty 14

## 2016-07-03 MED ORDER — ACETAMINOPHEN 500 MG PO TABS
1000.0000 mg | ORAL_TABLET | Freq: Four times a day (QID) | ORAL | Status: DC
  Administered 2016-07-03 – 2016-07-06 (×10): 1000 mg via ORAL
  Filled 2016-07-03 (×10): qty 2

## 2016-07-03 MED ORDER — HEMOSTATIC AGENTS (NO CHARGE) OPTIME
TOPICAL | Status: DC | PRN
  Administered 2016-07-03: 1 via TOPICAL

## 2016-07-03 MED ORDER — OXYCODONE HCL 5 MG PO TABS: 5.0000 mg | ORAL_TABLET | ORAL | Status: DC | PRN

## 2016-07-03 MED ORDER — DEXTROSE 5 % IV SOLN
1.5000 g | INTRAVENOUS | Status: AC
Start: 2016-07-03 — End: 2016-07-03
  Administered 2016-07-03: 1.5 g via INTRAVENOUS
  Filled 2016-07-03: qty 1.5

## 2016-07-03 MED ORDER — LACTATED RINGERS IV SOLN
INTRAVENOUS | Status: DC | PRN
  Administered 2016-07-03: 07:00:00 via INTRAVENOUS

## 2016-07-03 MED ORDER — PHYSOSTIGMINE SALICYLATE 1 MG/ML IJ SOLN
2.0000 mg | INTRAMUSCULAR | Status: AC
  Administered 2016-07-03: 2 mg via INTRAVENOUS

## 2016-07-03 MED ORDER — BISACODYL 5 MG PO TBEC
10.0000 mg | DELAYED_RELEASE_TABLET | Freq: Every day | ORAL | Status: DC
  Administered 2016-07-03 – 2016-07-04 (×2): 10 mg via ORAL
  Filled 2016-07-03 (×2): qty 2

## 2016-07-03 MED ORDER — MIDAZOLAM HCL 5 MG/5ML IJ SOLN
INTRAMUSCULAR | Status: DC | PRN
  Administered 2016-07-03: 2 mg via INTRAVENOUS

## 2016-07-03 MED ORDER — SUGAMMADEX SODIUM 200 MG/2ML IV SOLN
INTRAVENOUS | Status: DC | PRN
  Administered 2016-07-03: 130 mg via INTRAVENOUS

## 2016-07-03 MED ORDER — PHENYLEPHRINE 40 MCG/ML (10ML) SYRINGE FOR IV PUSH (FOR BLOOD PRESSURE SUPPORT)
PREFILLED_SYRINGE | INTRAVENOUS | Status: AC
  Filled 2016-07-03: qty 10

## 2016-07-03 MED ORDER — HYDROMORPHONE HCL 1 MG/ML IJ SOLN
INTRAMUSCULAR | Status: AC
  Filled 2016-07-03: qty 0.5

## 2016-07-03 MED ORDER — PROMETHAZINE HCL 25 MG/ML IJ SOLN: 6.2500 mg | INTRAMUSCULAR | Status: DC | PRN

## 2016-07-03 MED ORDER — HYDROMORPHONE HCL 1 MG/ML IJ SOLN
INTRAMUSCULAR | Status: AC
  Administered 2016-07-03: 0.5 mg via INTRAVENOUS
  Filled 2016-07-03: qty 1

## 2016-07-03 MED ORDER — MIDAZOLAM HCL 2 MG/2ML IJ SOLN
INTRAMUSCULAR | Status: AC
  Filled 2016-07-03: qty 2

## 2016-07-03 MED ORDER — FENTANYL CITRATE (PF) 250 MCG/5ML IJ SOLN
INTRAMUSCULAR | Status: AC
  Filled 2016-07-03: qty 5

## 2016-07-03 MED ORDER — DIPHENHYDRAMINE HCL 50 MG/ML IJ SOLN: 12.5000 mg | Freq: Four times a day (QID) | INTRAMUSCULAR | Status: DC | PRN

## 2016-07-03 MED ORDER — FENTANYL CITRATE (PF) 100 MCG/2ML IJ SOLN
INTRAMUSCULAR | Status: DC | PRN
  Administered 2016-07-03: 50 ug via INTRAVENOUS
  Administered 2016-07-03: 100 ug via INTRAVENOUS
  Administered 2016-07-03 (×2): 50 ug via INTRAVENOUS

## 2016-07-03 MED ORDER — HYDROMORPHONE HCL 1 MG/ML IJ SOLN
0.2500 mg | INTRAMUSCULAR | Status: DC | PRN
  Administered 2016-07-03 (×3): 0.5 mg via INTRAVENOUS

## 2016-07-03 MED ORDER — CEFUROXIME SODIUM 1.5 G IV SOLR
1.5000 g | Freq: Two times a day (BID) | INTRAVENOUS | Status: AC
  Administered 2016-07-03 (×2): 1.5 g via INTRAVENOUS
  Filled 2016-07-03 (×2): qty 1.5

## 2016-07-03 SURGICAL SUPPLY — 84 items
APPLICATOR TIP COSEAL (VASCULAR PRODUCTS) IMPLANT
APPLICATOR TIP EXT COSEAL (VASCULAR PRODUCTS) IMPLANT
BENZOIN TINCTURE PRP APPL 2/3 (GAUZE/BANDAGES/DRESSINGS) IMPLANT
CANISTER SUCT 3000ML PPV (MISCELLANEOUS) ×6 IMPLANT
CATH KIT ON Q 5IN SLV (PAIN MANAGEMENT) IMPLANT
CATH THORACIC 28FR (CATHETERS) ×3 IMPLANT
CATH THORACIC 36FR (CATHETERS) IMPLANT
CATH THORACIC 36FR RT ANG (CATHETERS) IMPLANT
CLIP TI MEDIUM 6 (CLIP) ×3 IMPLANT
CONN ST 1/4X3/8  BEN (MISCELLANEOUS)
CONN ST 1/4X3/8 BEN (MISCELLANEOUS) IMPLANT
CONN Y 3/8X3/8X3/8  BEN (MISCELLANEOUS)
CONN Y 3/8X3/8X3/8 BEN (MISCELLANEOUS) IMPLANT
CONT SPEC 4OZ CLIKSEAL STRL BL (MISCELLANEOUS) ×6 IMPLANT
COVER SURGICAL LIGHT HANDLE (MISCELLANEOUS) ×6 IMPLANT
DERMABOND ADVANCED (GAUZE/BANDAGES/DRESSINGS) ×2
DERMABOND ADVANCED .7 DNX12 (GAUZE/BANDAGES/DRESSINGS) ×1 IMPLANT
DRAIN CHANNEL 28F RND 3/8 FF (WOUND CARE) IMPLANT
DRAIN CHANNEL 32F RND 10.7 FF (WOUND CARE) IMPLANT
DRAPE LAPAROSCOPIC ABDOMINAL (DRAPES) ×3 IMPLANT
DRAPE WARM FLUID 44X44 (DRAPE) IMPLANT
DRILL BIT 7/64X5 (BIT) IMPLANT
ELECT REM PT RETURN 9FT ADLT (ELECTROSURGICAL) ×3
ELECTRODE REM PT RTRN 9FT ADLT (ELECTROSURGICAL) ×1 IMPLANT
GAUZE SPONGE 4X4 12PLY STRL (GAUZE/BANDAGES/DRESSINGS) ×3 IMPLANT
GAUZE SPONGE 4X4 12PLY STRL LF (GAUZE/BANDAGES/DRESSINGS) ×3 IMPLANT
GLOVE BIO SURGEON STRL SZ 6.5 (GLOVE) ×4 IMPLANT
GLOVE BIO SURGEONS STRL SZ 6.5 (GLOVE) ×2
GLOVE BIOGEL PI IND STRL 6.5 (GLOVE) ×3 IMPLANT
GLOVE BIOGEL PI INDICATOR 6.5 (GLOVE) ×6
GLOVE EUDERMIC 7 POWDERFREE (GLOVE) ×6 IMPLANT
GLOVE SS PI  5.5 STRL (GLOVE) ×2
GLOVE SS PI 5.5 STRL (GLOVE) ×1 IMPLANT
GOWN STRL REUS W/ TWL LRG LVL3 (GOWN DISPOSABLE) ×4 IMPLANT
GOWN STRL REUS W/ TWL XL LVL3 (GOWN DISPOSABLE) ×1 IMPLANT
GOWN STRL REUS W/TWL LRG LVL3 (GOWN DISPOSABLE) ×8
GOWN STRL REUS W/TWL XL LVL3 (GOWN DISPOSABLE) ×2
HANDLE STAPLE ENDO GIA SHORT (STAPLE) ×2
KIT BASIN OR (CUSTOM PROCEDURE TRAY) ×3 IMPLANT
KIT ROOM TURNOVER OR (KITS) ×3 IMPLANT
KIT SUCTION CATH 14FR (SUCTIONS) ×3 IMPLANT
NS IRRIG 1000ML POUR BTL (IV SOLUTION) ×12 IMPLANT
PACK CHEST (CUSTOM PROCEDURE TRAY) ×3 IMPLANT
PAD ARMBOARD 7.5X6 YLW CONV (MISCELLANEOUS) ×6 IMPLANT
POUCH SPECIMEN RETRIEVAL 10MM (ENDOMECHANICALS) ×3 IMPLANT
RELOAD EGIA 45 MED/THCK PURPLE (STAPLE) ×3 IMPLANT
RELOAD EGIA 60 MED/THCK PURPLE (STAPLE) ×9 IMPLANT
SEALANT SURG COSEAL 4ML (VASCULAR PRODUCTS) IMPLANT
SEALANT SURG COSEAL 8ML (VASCULAR PRODUCTS) ×3 IMPLANT
SOLUTION ANTI FOG 6CC (MISCELLANEOUS) ×3 IMPLANT
STAPLER ENDO GIA 12MM SHORT (STAPLE) ×1 IMPLANT
SUT PROLENE 3 0 SH DA (SUTURE) IMPLANT
SUT PROLENE 4 0 RB 1 (SUTURE)
SUT PROLENE 4-0 RB1 .5 CRCL 36 (SUTURE) IMPLANT
SUT SILK  1 MH (SUTURE) ×4
SUT SILK 1 MH (SUTURE) ×2 IMPLANT
SUT SILK 1 TIES 10X30 (SUTURE) IMPLANT
SUT SILK 2 0 SH (SUTURE) ×6 IMPLANT
SUT SILK 2 0SH CR/8 30 (SUTURE) IMPLANT
SUT SILK 3 0SH CR/8 30 (SUTURE) IMPLANT
SUT VIC AB 1 CTX 36 (SUTURE) ×2
SUT VIC AB 1 CTX36XBRD ANBCTR (SUTURE) ×1 IMPLANT
SUT VIC AB 2-0 CT1 27 (SUTURE)
SUT VIC AB 2-0 CT1 TAPERPNT 27 (SUTURE) IMPLANT
SUT VIC AB 2-0 CTX 36 (SUTURE) ×3 IMPLANT
SUT VIC AB 2-0 UR6 27 (SUTURE) IMPLANT
SUT VIC AB 3-0 MH 27 (SUTURE) IMPLANT
SUT VIC AB 3-0 SH 27 (SUTURE)
SUT VIC AB 3-0 SH 27X BRD (SUTURE) IMPLANT
SUT VIC AB 3-0 X1 27 (SUTURE) ×3 IMPLANT
SUT VICRYL 2 TP 1 (SUTURE) ×3 IMPLANT
SWAB COLLECTION DEVICE MRSA (MISCELLANEOUS) IMPLANT
SWAB CULTURE ESWAB REG 1ML (MISCELLANEOUS) IMPLANT
SYSTEM SAHARA CHEST DRAIN ATS (WOUND CARE) ×3 IMPLANT
TAPE CLOTH SURG 4X10 WHT LF (GAUZE/BANDAGES/DRESSINGS) ×3 IMPLANT
TIP APPLICATOR SPRAY EXTEND 16 (VASCULAR PRODUCTS) ×3 IMPLANT
TOWEL GREEN STERILE (TOWEL DISPOSABLE) ×12 IMPLANT
TOWEL GREEN STERILE FF (TOWEL DISPOSABLE) ×6 IMPLANT
TOWEL OR 17X24 6PK STRL BLUE (TOWEL DISPOSABLE) ×3 IMPLANT
TOWEL OR 17X26 10 PK STRL BLUE (TOWEL DISPOSABLE) ×6 IMPLANT
TRAP SPECIMEN MUCOUS 40CC (MISCELLANEOUS) IMPLANT
TRAY FOLEY W/METER SILVER 14FR (SET/KITS/TRAYS/PACK) ×3 IMPLANT
TUNNELER SHEATH ON-Q 11GX8 DSP (PAIN MANAGEMENT) IMPLANT
WATER STERILE IRR 1000ML POUR (IV SOLUTION) ×6 IMPLANT

## 2016-07-03 NOTE — Op Note (Signed)
CARDIOTHORACIC SURGERY OPERATIVE NOTE  07/03/2016 [name] [id number]  Surgeon:  Gaye Pollack, MD  First Assistant: Ellwood Handler, PA-C   Preoperative Diagnosis:  Hypermetabolic right upper lobe lung nodule   Postoperative Diagnosis: Same  Procedure:  1. Right video-assisted thoracoscopy  2. Wedge resection of right upper lobe lung nodule  Anesthesia:  General Endotracheal   Clinical History/Surgical Indication:  The patient is a 61 year old gentleman status post left upper lobectomy on 06/12/2011 for a stage IB (T2A, N0, M0) non-small cell lung cancer consistent with adenocarcinoma. This was a 2.0 cm tumor with visceral pleural invasion. A CT of the chest in March showed no PE but did show a new 7 mm spiculated nodule in the RUL. A follow up CT on 08/12/2015 showed no change in this nodule. His CT on 11/14/2015 showed slight interval enlargement of this nodule to 8 mm. His most recent CT scan on 01/30/2016 showed continued increase in size of the nodule to 9 mm and it was felt to be highly suspicious for bronchogenic cancer. His PET scan shows hypermetabolic activity in the RUL lung nodule with an SUV max of 12.65. There is a mildly hypermetabolic 7 mm pretracheal lymph node with an SUV of 3.7 and a 7.5 mm precarinal lymph node with an SUV of 3.7. There is an AP window lymph node with an SUV of 4.2. There is no other hypermetabolic uptake.He had PFT's on 06/21/2015 which showed a significant deterioration in lung function since his preop PFT's in 05/2011 with a diffusion capacity of only 26% predicted down from 31% and an FEV1 of 1.76, down from 2.5. His PFT's were repeated on 02/13/2016 and showed an FEV1 of 1.59 improving to 1.78 after bronchodilators. The DLCO was 23% of predictedThe He denies any headaches or visual changes. He denies any bone pain but has pain in his neck and shoulders due to three level cervical discectomy and fusion in October 2017. He underwent  mediastinoscopy on 03/10/2016 and all of the specimens were negative for malignancy. I discussed the surgical procedure of wedge resection by VATS or thoracotomy, alternative of needle biopsy and XRT, benefits and risks including but not limited to bleeding, infection, positive surgical margin requiring postop XRT, respiratory failure, and recurrent cancer. He understands and agrees to proceed.  Preparation:  The patient was seen in the preoperative holding area and the correct patient, correct operation, correct operative side were confirmed with the patient after reviewing the medical record and CT scan. The right side of the chest was signed by me. The consent was signed by me. Preoperative antibiotics were given.  The patient was taken back to the operating room and positioned supine on the operating room table. After being placed under general endotracheal anesthesia by the anesthesia team using a double lumen tube a foley catheter was placed. The patient was turned into the left lateral decubitus position. The right chest was prepped with betadine soap and solution.  A surgical time-out was taken and the correct patient,operative side, and operative procedure were confirmed with the nursing and anesthesia staff.   Operative Procedure:  A 1 cm incision was made in the mid-axillary line at the 8th intercostal space. The right lung was deflated. A 10 mm trocar was inserted into the pleural space and a 0 degree thoracoscope was inserted. The lung had severe emphysema. The right lung was hyperinflated with air-trapping and even with suctioning the lung did not deflate much. The lung was compressed manually  to allow visualization. The pleural surfaces were normal. The right upper lobe was examined and there were multiple blebs at the apex. A 1 cm incision was made anteriorly over the 4th ICS for the scope and a 1 cm incision was made posteriorly over the 5th ICS for instruments. The medial apex was palpated  using a spatula and the nodule could be felt in this area. There were a couple adhesions to the chest wall at this location and these were divided using cautery. The This lesion was removed by wedge resection using surgical staplers inserted through the mid-axillary incision. The specimen was removed from the chest using an endobag. It was sent to pathology for permanent section. The staple line was coated with Coseal. There was no residual air leak. A 28 F chest tube was placed through the mid-axillary trocar incision and advanced posteriorly to the apex. The subcutaneous tissue was closed with 2-0 vicryl continuous suture. The skin was closed with 3-0 vicryl subcuticular suture. All sponge, needle, and instrument counts were reported correct at the end of the case. Dry sterile dressings were placed over the incisions and around the chest tube which was connected to pleurevac suction. The patient was turned supine, extubated, then transported to the PACU in satisfactory and stable condition.

## 2016-07-03 NOTE — Anesthesia Procedure Notes (Signed)
Procedure Name: Intubation Date/Time: 07/03/2016 7:43 AM Performed by: Garrison Columbus T Pre-anesthesia Checklist: Patient identified, Emergency Drugs available, Suction available and Patient being monitored Patient Re-evaluated:Patient Re-evaluated prior to inductionOxygen Delivery Method: Circle System Utilized Preoxygenation: Pre-oxygenation with 100% oxygen Intubation Type: IV induction Ventilation: Mask ventilation without difficulty and Oral airway inserted - appropriate to patient size Laryngoscope Size: Sabra Heck and 2 Grade View: Grade I Endobronchial tube: Double lumen EBT, Left, EBT position confirmed by auscultation and EBT position confirmed by fiberoptic bronchoscope and 39 Fr Number of attempts: 1 Airway Equipment and Method: Stylet and Oral airway Placement Confirmation: ETT inserted through vocal cords under direct vision,  positive ETCO2 and breath sounds checked- equal and bilateral Tube secured with: Tape Dental Injury: Teeth and Oropharynx as per pre-operative assessment

## 2016-07-03 NOTE — Anesthesia Procedure Notes (Signed)
Central Venous Catheter Insertion Performed by: Lillia Abed, anesthesiologist Start/End5/31/2018 6:45 AM, 07/03/2016 7:00 AM Patient location: Pre-op. Preanesthetic checklist: patient identified, IV checked, risks and benefits discussed, surgical consent, monitors and equipment checked, pre-op evaluation, timeout performed and anesthesia consent Lidocaine 1% used for infiltration and patient sedated Hand hygiene performed  and maximum sterile barriers used  Catheter size: 8 Fr Total catheter length 16. Central line was placed.Double lumen Procedure performed using ultrasound guided technique. Ultrasound Notes:anatomy identified, needle tip was noted to be adjacent to the nerve/plexus identified, no ultrasound evidence of intravascular and/or intraneural injection and image(s) printed for medical record Attempts: 1 Following insertion, dressing applied and line sutured. Post procedure assessment: blood return through all ports, free fluid flow and no air  Patient tolerated the procedure well with no immediate complications.

## 2016-07-03 NOTE — Transfer of Care (Signed)
Immediate Anesthesia Transfer of Care Note  Patient: [name]  Procedure(s) Performed: Procedure(s): RIGHT VIDEO ASSISTED THORACOSCOPY (VATS)/WEDGE RESECTION (Right)  Patient Location: PACU  Anesthesia Type:General  Level of Consciousness: awake, alert  and oriented  Airway & Oxygen Therapy: Patient Spontanous Breathing and Patient connected to nasal cannula oxygen  Post-op Assessment: Report given to RN, Post -op Vital signs reviewed and stable and Patient moving all extremities X 4  Post vital signs: Reviewed and stable  Last Vitals:  Vitals:   07/03/16 0609  BP: 115/80  Pulse: (!) 48  Resp: 16  Temp: 36.4 C    Last Pain:  Vitals:   07/03/16 0614  TempSrc:   PainSc: 0-No pain      Patients Stated Pain Goal: 4 (67/34/19 3790)  Complications: No apparent anesthesia complications

## 2016-07-03 NOTE — Interval H&P Note (Signed)
History and Physical Interval Note:  07/03/2016 7:17 AM  [name]  has presented today for surgery, with the diagnosis of RUL LUNG NODULE  The various methods of treatment have been discussed with the patient and family. After consideration of risks, benefits and other options for treatment, the patient has consented to  Procedure(s): VIDEO ASSISTED THORACOSCOPY (VATS)/WEDGE RESECTION (Right) THORACOTOMY MAJOR (Right) as a surgical intervention .  The patient's history has been reviewed, patient examined, no change in status, stable for surgery.  I have reviewed the patient's chart and labs.  Questions were answered to the patient's satisfaction.     Gaye Pollack

## 2016-07-03 NOTE — Brief Op Note (Signed)
07/03/2016  9:17 AM  PATIENT:  [name]  61 y.o. male  PRE-OPERATIVE DIAGNOSIS:  RIGHT UPPER LOBE LUNG NODULE  POST-OPERATIVE DIAGNOSIS:  RIGHT UPPER LOBE LUNG NODULE  PROCEDURE:  Procedure(s): VIDEO ASSISTED THORACOSCOPY (VATS)/WEDGE RESECTION (Right)   SURGEON:  Surgeon(s) and Role:    * Bartle, Fernande Boyden, MD - Primary  PHYSICIAN ASSISTANT: Erin Barrett, PA-C   ANESTHESIA:   general  EBL:  Total I/O In: 1100 [I.V.:1100] Out: 105 [Urine:75; Blood:30]  BLOOD ADMINISTERED:none  DRAINS: one 28 F Chest Tube(s) in the right pleural space   LOCAL MEDICATIONS USED:  NONE  SPECIMEN:  Source of Specimen:  wedge right upper lobe lung  DISPOSITION OF SPECIMEN:  PATHOLOGY  COUNTS:  YES  TOURNIQUET:  * No tourniquets in log *  DICTATION: .Note written in EPIC  PLAN OF CARE: Admit to inpatient   PATIENT DISPOSITION:  PACU - hemodynamically stable.   Delay start of Pharmacological VTE agent (>24hrs) due to surgical blood loss or risk of bleeding: yes

## 2016-07-03 NOTE — Progress Notes (Signed)
Plastic tip of dilaudid PCA was broken on arrival to PACU from tube station. Unable to attach to IV tubing. 25 mL dilaudid wasted in sink with Orie Fisherman, RN. Pharmacy notified to send new dilaudid syringe.

## 2016-07-03 NOTE — Anesthesia Postprocedure Evaluation (Signed)
Anesthesia Post Note  Patient: [name]  Procedure(s) Performed: Procedure(s) (LRB): RIGHT VIDEO ASSISTED THORACOSCOPY (VATS)/WEDGE RESECTION (Right)     Patient location during evaluation: PACU Anesthesia Type: General Level of consciousness: awake, oriented and sedated Pain management: pain level controlled Vital Signs Assessment: post-procedure vital signs reviewed and stable Respiratory status: spontaneous breathing, nonlabored ventilation, respiratory function stable and patient connected to nasal cannula oxygen Cardiovascular status: blood pressure returned to baseline and stable Postop Assessment: no signs of nausea or vomiting Anesthetic complications: no    Last Vitals:  Vitals:   07/03/16 1130 07/03/16 1200  BP: (!) 145/84 134/84  Pulse: (!) 53 (!) 49  Resp: 11 12  Temp:      Last Pain:  Vitals:   07/03/16 1115  TempSrc:   PainSc: Asleep                 Artemisa Sladek,JAMES TERRILL

## 2016-07-03 NOTE — Plan of Care (Signed)
Problem: Cardiac: Goal: Hemodynamic stability will improve Outcome: Progressing VSS at this time will continue to monitor

## 2016-07-03 NOTE — Anesthesia Preprocedure Evaluation (Addendum)
Anesthesia Evaluation  Patient identified by MRN, date of birth, ID band Patient awake    Reviewed: Allergy & Precautions, NPO status , Patient's Chart, lab work & pertinent test results  Airway Mallampati: II  TM Distance: >3 FB Neck ROM: Full    Dental  (+) Edentulous Upper, Edentulous Lower, Dental Advisory Given   Pulmonary COPD,  COPD inhaler and oxygen dependent, Current Smoker,    breath sounds clear to auscultation       Cardiovascular + CAD and + Past MI   Rhythm:Regular Rate:Normal     Neuro/Psych  Headaches, PSYCHIATRIC DISORDERS Depression    GI/Hepatic GERD  ,  Endo/Other  negative endocrine ROS  Renal/GU negative Renal ROS     Musculoskeletal   Abdominal   Peds  Hematology negative hematology ROS (+)   Anesthesia Other Findings   Reproductive/Obstetrics                           Anesthesia Physical Anesthesia Plan  ASA: III  Anesthesia Plan: General   Post-op Pain Management:    Induction: Intravenous  Airway Management Planned: Double Lumen EBT  Additional Equipment: Arterial line and CVP  Intra-op Plan:   Post-operative Plan: Extubation in OR and Possible Post-op intubation/ventilation  Informed Consent: I have reviewed the patients History and Physical, chart, labs and discussed the procedure including the risks, benefits and alternatives for the proposed anesthesia with the patient or authorized representative who has indicated his/her understanding and acceptance.   Dental advisory given  Plan Discussed with: CRNA and Anesthesiologist  Anesthesia Plan Comments:        Anesthesia Quick Evaluation

## 2016-07-04 ENCOUNTER — Inpatient Hospital Stay (HOSPITAL_COMMUNITY): Payer: Medicaid Other

## 2016-07-04 ENCOUNTER — Encounter (HOSPITAL_COMMUNITY): Payer: Self-pay | Admitting: Surgery

## 2016-07-04 DIAGNOSIS — Z902 Acquired absence of lung [part of]: Secondary | ICD-10-CM

## 2016-07-04 LAB — CBC
HCT: 39.5 % (ref 39.0–52.0)
Hemoglobin: 12.9 g/dL — ABNORMAL LOW (ref 13.0–17.0)
MCH: 30.6 pg (ref 26.0–34.0)
MCHC: 32.7 g/dL (ref 30.0–36.0)
MCV: 93.6 fL (ref 78.0–100.0)
Platelets: 220 10*3/uL (ref 150–400)
RBC: 4.22 MIL/uL (ref 4.22–5.81)
RDW: 14.9 % (ref 11.5–15.5)
WBC: 8.4 10*3/uL (ref 4.0–10.5)

## 2016-07-04 LAB — BASIC METABOLIC PANEL
Anion gap: 6 (ref 5–15)
BUN: 8 mg/dL (ref 6–20)
CO2: 23 mmol/L (ref 22–32)
Calcium: 8.2 mg/dL — ABNORMAL LOW (ref 8.9–10.3)
Chloride: 107 mmol/L (ref 101–111)
Creatinine, Ser: 0.88 mg/dL (ref 0.61–1.24)
GFR calc Af Amer: >60 mL/min (ref >60)
GFR calc non Af Amer: >60 mL/min (ref >60)
Glucose, Bld: 87 mg/dL (ref 65–99)
Potassium: 4.4 mmol/L (ref 3.5–5.1)
Sodium: 136 mmol/L (ref 135–145)

## 2016-07-04 MED ORDER — DIPHENHYDRAMINE HCL 25 MG PO CAPS
25.0000 mg | ORAL_CAPSULE | Freq: Every evening | ORAL | Status: DC | PRN
  Administered 2016-07-04 – 2016-07-05 (×2): 25 mg via ORAL
  Filled 2016-07-04 (×2): qty 1

## 2016-07-04 NOTE — Progress Notes (Addendum)
      [address]       [place] [address]             [phone]      1 Day Post-Op Procedure(s) (LRB): RIGHT VIDEO ASSISTED THORACOSCOPY (VATS)/WEDGE RESECTION (Right)   Subjective:  Complains he didn't sleep last night.  Pain mostly with activity, controls with pain medication.  + BM  Objective: Vital signs in last 24 hours: Temp:  [97.6 F (36.4 C)-98.8 F (37.1 C)] 98.1 F (36.7 C) (06/01 0340) Pulse Rate:  [45-74] 45 (06/01 0340) Cardiac Rhythm: Sinus bradycardia (06/01 0700) Resp:  [11-24] 17 (06/01 0350) BP: (104-150)/(72-97) 115/76 (06/01 0340) SpO2:  [93 %-100 %] 97 % (06/01 0350) Arterial Line BP: (129-158)/(68-79) 139/71 (05/31 1600) Weight:  [143 lb 1.3 oz (64.9 kg)] 143 lb 1.3 oz (64.9 kg) (05/31 1634)  Intake/Output from previous day: 05/31 0701 - 06/01 0700 In: 2520.8 [P.O.:840; I.V.:1680.8] Out: 1267 [Urine:1125; Blood:30; Chest Tube:112]  General appearance: alert, cooperative and no distress Heart: regular rate and rhythm Lungs: clear to auscultation bilaterally Abdomen: soft, non-tender; bowel sounds normal; no masses,  no organomegaly Extremities: extremities normal, atraumatic, no cyanosis or edema Wound: clean and dry  Lab Results:  Recent Labs  07/01/16 1038 07/04/16 0355  WBC 5.8 8.4  HGB 14.7 12.9*  HCT 44.8 39.5  PLT 273 220   BMET:  Recent Labs  07/01/16 1038 07/04/16 0355  NA 139 136  K 4.2 4.4  CL 111 107  CO2 20* 23  GLUCOSE 96 87  BUN 13 8  CREATININE 0.98 0.88  CALCIUM 9.3 8.2*    PT/INR:  Recent Labs  07/01/16 1038  LABPROT 13.6  INR 1.03   ABG    Component Value Date/Time   PHART 7.405 07/01/2016 1103   HCO3 22.0 07/01/2016 1103   TCO2 27 08/12/2015 1839   ACIDBASEDEF 2.0 07/01/2016 1103   O2SAT 96.6 07/01/2016 1103   CBG (last 3)  No results for input(s): GLUCAP in the last 72 hours.  Assessment/Plan: S/P Procedure(s) (LRB): RIGHT VIDEO ASSISTED THORACOSCOPY (VATS)/WEDGE  RESECTION (Right)  1. CV- Sinus brady, BP controlled- will monitor 2. Pulm- chest tube-minimal output since surgery, no air leak present, CXR is free from pneuothorax---may be able to transition to water seal will discuss with Dr. Cyndia Bent, as patient has severe emphysema 3. D/C Arterial Line 4. Decrease IV Fluids 5. D/C Foley catheter 6. Insomnia- will add prn Benadryl, patient with substance abuse history and does want ambien 7. Dispo- patient stable, will repeat CXR in AM, will transition chest tube to water seal if okay with Dr. Laverta Baltimore, labs ok, continue current care   LOS: 1 day    BARRETT, ERIN 07/04/2016   Chart reviewed, patient examined, agree with above. CXR looks good and no air leak. Put to water seal Plan chest tube out tomorrow. Pathology pending
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
## 2016-07-04 NOTE — Progress Notes (Signed)
Initial Nutrition Assessment  DOCUMENTATION CODES:   Non-severe (moderate) malnutrition in context of chronic illness  INTERVENTION:    Ensure Enlive po BID, each supplement provides 350 kcal and 20 grams of protein  NUTRITION DIAGNOSIS:   Malnutrition (moderate) related to chronic illness (polysubstance abuse, CAD, lung CA) as evidenced by mild depletion of muscle mass, moderate depletions of muscle mass, severe depletion of muscle mass, mild depletion of body fat, moderate depletion of body fat.  GOAL:   Patient will meet greater than or equal to 90% of their needs  MONITOR:   PO intake, Supplement acceptance, Labs, I & O's  REASON FOR ASSESSMENT:   Malnutrition Screening Tool    ASSESSMENT:   61 yo male with PMH of tobacco abuse, lung CA, alcoholic pancreatitis, HLD, CAD, and polysubstance abuse who was admitted on 5/31 for VATS/wedge resection for RUL lung nodule.   S/P VATS, chest tube to water seal.  Patient reports poor intake for the past few months. He thinks he has lost weight, but unsure of amount. Per review of usual weights in EMR, patient has lost 2% of usual weight in one month. He has been drinking Ensure supplements since admission and likes them.  Nutrition-Focused physical exam completed. Findings are mild-moderate fat depletion, mild-moderate and severe muscle depletion, and no edema.   Labs reviewed. Medications reviewed and include KCl.  Diet Order:  Diet regular Room service appropriate? Yes; Fluid consistency: Thin  Skin:   (right chest incision-chest tube)  Last BM:  6/1  Height:   Ht Readings from Last 1 Encounters:  07/03/16 5\' 9"  (1.753 m)    Weight:   Wt Readings from Last 1 Encounters:  07/03/16 143 lb 1.3 oz (64.9 kg)    Ideal Body Weight:  72.7 kg  BMI:  Body mass index is 21.13 kg/m.  Estimated Nutritional Needs:   Kcal:  1900-2100  Protein:  90-110 gm  Fluid:  2 L  EDUCATION NEEDS:   No education needs  identified at this time  Molli Barrows, [place], [place], [place] Pager [phone] After Hours Pager [phone]

## 2016-07-04 NOTE — Care Management Note (Addendum)
Case Management Note  Patient Details  Name: [name] MRN: [id number] Date of Birth: 08-20-1955  Subjective/Objective:   From home alone, s/p vats wedge resction.chest tube to water seal. For cxr in am, he has home oxygen with [employer] (2 liters) , he uses it only at night.  He has [employer] which he gets medications with.   PCP   Juluis Mire              Action/Plan: NCM will follow for dc needs.  Expected Discharge Date:                  Expected Discharge Plan:  Home/Self Care  In-House Referral:     Discharge planning Services  CM Consult  Post Acute Care Choice:    Choice offered to:     DME Arranged:    DME Agency:     HH Arranged:    HH Agency:     Status of Service:  In process, will continue to follow  If discussed at Long Length of Stay Meetings, dates discussed:    Additional Comments:  Zenon Mayo, RN 07/04/2016, 1:08 PM

## 2016-07-05 ENCOUNTER — Inpatient Hospital Stay (HOSPITAL_COMMUNITY): Payer: Medicaid Other

## 2016-07-05 NOTE — Progress Notes (Signed)
Pt rested through the night. Increased use of PCA compared to yesterday, seems to be better after dose of oral Tylenol. Small amount of drainage noted from chest tube without signs of air leak. Pt ambulated halls without difficulty this morning.

## 2016-07-05 NOTE — Progress Notes (Addendum)
      [address]       [place] [address]             [phone]      2 Days Post-Op Procedure(s) (LRB): RIGHT VIDEO ASSISTED THORACOSCOPY (VATS)/WEDGE RESECTION (Right) Subjective: Feels good this afternoon, eating lunch  Objective: Vital signs in last 24 hours: Temp:  [97.8 F (36.6 C)-99.2 F (37.3 C)] 97.8 F (36.6 C) (06/02 1125) Pulse Rate:  [44-73] 55 (06/02 1125) Cardiac Rhythm: Sinus bradycardia (06/02 1125) Resp:  [11-19] 16 (06/02 1125) BP: (116-138)/(79-94) 134/88 (06/02 1125) SpO2:  [92 %-99 %] 95 % (06/02 1125)     Intake/Output from previous day: 06/01 0701 - 06/02 0700 In: 1757.3 [P.O.:1420; I.V.:337.3] Out: 6294 [Urine:1400; Chest Tube:52] Intake/Output this shift: Total I/O In: 305.3 [P.O.:240; I.V.:65.3] Out: 400 [Urine:400]  General appearance: alert, cooperative and no distress Heart: regular rate and rhythm, S1, S2 normal, no murmur, click, rub or gallop Lungs: clear to auscultation bilaterally Abdomen: soft, non-tender; bowel sounds normal; no masses,  no organomegaly Extremities: extremities normal, atraumatic, no cyanosis or edema Wound: clean and dry  Lab Results:  Recent Labs  07/04/16 0355  WBC 8.4  HGB 12.9*  HCT 39.5  PLT 220   BMET:  Recent Labs  07/04/16 0355  NA 136  K 4.4  CL 107  CO2 23  GLUCOSE 87  BUN 8  CREATININE 0.88  CALCIUM 8.2*    PT/INR: No results for input(s): LABPROT, INR in the last 72 hours. ABG    Component Value Date/Time   PHART 7.405 07/01/2016 1103   HCO3 22.0 07/01/2016 1103   TCO2 27 08/12/2015 1839   ACIDBASEDEF 2.0 07/01/2016 1103   O2SAT 96.6 07/01/2016 1103   CBG (last 3)  No results for input(s): GLUCAP in the last 72 hours.  Assessment/Plan: S/P Procedure(s) (LRB): RIGHT VIDEO ASSISTED THORACOSCOPY (VATS)/WEDGE RESECTION (Right)  1. CV-sinus brady, has been stable, BP stable 2. Pulm-minimal drainage from the chest tube, no pneumo on CXR and no air leak.  Will discontinue chest tube.  3. Central line one more day 4. Stop IV fluids, the patient is eating 5. Insomnia-Benadryl added PRN  Plan: discontinue chest tube. Can discontinue PCA shortly after. CXR in the morning. Continue to wean oxygen as tolerated, the patient is on 2L at home. Likely discharge tomorrow if remains stable.      LOS: 2 days    Elgie Collard 07/05/2016  I have seen and examined the patient and agree with the assessment and plan as outlined.  D/C chest tube.  Anticipate possible D/C home tomorrow  Rexene Alberts, MD 07/05/2016 2:25 PM

## 2016-07-05 NOTE — Progress Notes (Signed)
13mls of dilaudid from pca wasted in sink with Careers adviser. Consuelo Pandy RN

## 2016-07-06 ENCOUNTER — Inpatient Hospital Stay (HOSPITAL_COMMUNITY): Payer: Medicaid Other

## 2016-07-06 MED ORDER — OXYCODONE HCL 5 MG PO TABS: 5.0000 mg | ORAL_TABLET | Freq: Four times a day (QID) | ORAL | 0 refills | Status: DC | PRN

## 2016-07-06 NOTE — Progress Notes (Signed)
Pt up watching TV, denies pain. Pt on 2L nasal cannula which is baseline for pt. Pt's lung sounds are slightly rhonchi. Pt has good productive cough. Encouraging pt to use IS. Consuelo Pandy RN

## 2016-07-06 NOTE — Progress Notes (Signed)
Removed pt's PIV and right IJ, reviewed incision care including s/s of infection and when to call MD. Reviewed follow up appts including appt for x-ray before follow up appt with Dr Cyndia Bent. Pt will be discharged on 2L O2 and family has O2 concentrator for pt to go home with. Consuelo Pandy RN

## 2016-07-06 NOTE — Plan of Care (Signed)
Problem: Respiratory: Goal: Pain level will decrease with appropriate interventions Outcome: Progressing Pt actively using incentive spirometer, reinforced importance of use of pillow for comfort with coughing, deep breathing, and rest.

## 2016-07-06 NOTE — Discharge Instructions (Signed)
Video-Assisted Thoracic Surgery, Care After ° °This sheet gives you information about how to care for yourself after your procedure. Your health care provider may also give you more specific instructions. If you have problems or questions, contact your health care provider. °What can I expect after the procedure? °After the procedure, it is common to have: °· Some pain and soreness in your chest. °· Pain when breathing in (inhaling) and coughing. °· Constipation. °· Fatigue. °· Difficulty sleeping. ° °Follow these instructions at home: °Preventing pneumonia °· Take deep breaths or do breathing exercises as instructed by your health care provider. Doing this helps prevent lung infection (pneumonia). °· Cough frequently. Coughing may cause discomfort, but it is important to clear mucus (phlegm) and expand your lungs. If it hurts to cough, hold a pillow against your chest or place the palms of both hands on top of the incision (use splinting) when you cough. This may help relieve discomfort. °· If you were given an incentive spirometer, use it as directed. An incentive spirometer is a tool that measures how well you are filling your lungs with each breath. °· Participate in pulmonary rehabilitation as directed by your health care provider. This is a program that combines education, exercise, and support from a team of specialists. The goal is to help you heal and get back to your normal activities as soon as possible. °Medicines °· Take over-the-counter or prescription medicines only as told by your health care provider. °· If you have pain, take pain-relieving medicine before your pain becomes severe. This is important because if your pain is under control, you will be able to breathe and cough more comfortably. °· If you were prescribed an antibiotic medicine, take it as told by your health care provider. Do not stop taking the antibiotic even if you start to feel better. °Activity °· Ask your health care provider  what activities are safe for you. °· Avoid activities that use your chest muscles for at least 3-4 weeks. °· Do not lift anything that is heavier than 10 lb (4.5 kg), or the limit that your health care provider tells you, until he or she says that it is safe. °Incision care °· Follow instructions from your health care provider about how to take care of your incision(s). Make sure you: °? Wash your hands with soap and water before you change your bandage (dressing). If soap and water are not available, use hand sanitizer. °? Change your dressing as told by your health care provider. °? Leave stitches (sutures), skin glue, or adhesive strips in place. These skin closures may need to stay in place for 2 weeks or longer. If adhesive strip edges start to loosen and curl up, you may trim the loose edges. Do not remove adhesive strips completely unless your health care provider tells you to do that. °· Keep your dressing dry until it has been removed. °· Check your incision area every day for signs of infection. Check for: °? Redness, swelling, or pain. °? Fluid or blood. °? Warmth. °? Pus or a bad smell. °Bathing °· Do not take baths, swim, or use a hot tub until your health care provider approves. You may take showers. °· After your dressing has been removed, use soap and water to gently wash your incision area. Do not use anything else to clean your incision(s) unless your health care provider tells you to do this. °Driving °· Do not drive until your health care provider approves. °· Do not drive   or use heavy machinery while taking prescription pain medicine. °Eating and drinking °· Eat a healthy, balanced diet as instructed by your health care provider. A healthy diet includes plenty of fresh fruits and vegetables, whole grains, and low-fat (lean) proteins. °· Limit foods that are high in fat and processed sugars, such as fried and sweet foods. °· Drink enough fluid to keep your urine clear or pale yellow. °General  instructions °· To prevent or treat constipation while you are taking prescription pain medicine, your health care provider may recommend that you: °? Take over-the-counter or prescription medicines. °? Eat foods that are high in fiber, such as beans, fresh fruits and vegetables, and whole grains. °· Do not use any products that contain nicotine or tobacco, such as cigarettes and e-cigarettes. If you need help quitting, ask your health care provider. °· Avoid secondhand smoke. °· Wear compression stockings as told by your health care provider. These stockings help to prevent blood clots and reduce swelling in your legs. °· If you have a chest tube, care for it as instructed by your health care provider. Do not travel by airplane during the 2 weeks after your chest tube is removed, or until your health care provider says that this is safe. °· Keep all follow-up visits as told by your health care provider. This is important. °Contact a health care provider if: °· You have redness, swelling, or pain around an incision. °· You have fluid or blood coming from an incision. °· Your incision area feels warm to the touch. °· You have pus or a bad smell coming from an incision. °· You have a fever or chills. °· You have nausea or vomiting. °· You have pain that does not get better with medicine. °Get help right away if: °· You have chest pain. °· Your heart is fluttering or beating rapidly. °· You develop a rash. °· You have shortness of breath or trouble breathing. °· You are confused. °· You have trouble speaking. °· You feel weak, light-headed, or dizzy. °· You faint. °Summary °· To help prevent lung infection (pneumonia), take deep breaths or do breathing exercises as instructed by your health care provider. °· Cough frequently to clear mucus (phlegm) and expand your lungs. If it hurts to cough, hold a pillow against your chest or place the palms of both hands on top of the incision (use splinting) when you cough. °· If  you have pain, take pain-relieving medicine before your pain becomes severe. This is important because if your pain is under control, you will be able to breathe and cough more comfortably. °· Ask your health care provider what activities are safe for you. °This information is not intended to replace advice given to you by your health care provider. Make sure you discuss any questions you have with your health care provider. °Document Released: 05/17/2012 Document Revised: 12/31/2015 Document Reviewed: 12/31/2015 °Elsevier Interactive Patient Education © 2017 [employer] ° °

## 2016-07-06 NOTE — Progress Notes (Addendum)
      [address]       [place] [address]             [phone]      3 Days Post-Op Procedure(s) (LRB): RIGHT VIDEO ASSISTED THORACOSCOPY (VATS)/WEDGE RESECTION (Right) Subjective: Feels good this morning. No issues.   Objective: Vital signs in last 24 hours: Temp:  [97.5 F (36.4 C)-98.7 F (37.1 C)] 97.5 F (36.4 C) (06/03 0825) Pulse Rate:  [53-64] 58 (06/03 0825) Cardiac Rhythm: Normal sinus rhythm (06/03 0825) Resp:  [11-17] 16 (06/03 0825) BP: (112-139)/(73-91) 117/84 (06/03 0825) SpO2:  [90 %-97 %] 96 % (06/03 0825)     Intake/Output from previous day: 06/02 0701 - 06/03 0700 In: 1075.3 [P.O.:960; I.V.:115.3] Out: 2025 [Urine:2025] Intake/Output this shift: Total I/O In: 240 [P.O.:240] Out: -   General appearance: alert, cooperative and no distress Heart: sinus bradycardia Lungs: clear to auscultation bilaterally Abdomen: soft, non-tender; bowel sounds normal; no masses,  no organomegaly Extremities: extremities normal, atraumatic, no cyanosis or edema Wound: clean and dry  Lab Results:  Recent Labs  07/04/16 0355  WBC 8.4  HGB 12.9*  HCT 39.5  PLT 220   BMET:  Recent Labs  07/04/16 0355  NA 136  K 4.4  CL 107  CO2 23  GLUCOSE 87  BUN 8  CREATININE 0.88  CALCIUM 8.2*    PT/INR: No results for input(s): LABPROT, INR in the last 72 hours. ABG    Component Value Date/Time   PHART 7.405 07/01/2016 1103   HCO3 22.0 07/01/2016 1103   TCO2 27 08/12/2015 1839   ACIDBASEDEF 2.0 07/01/2016 1103   O2SAT 96.6 07/01/2016 1103   CBG (last 3)  No results for input(s): GLUCAP in the last 72 hours.  Assessment/Plan: S/P Procedure(s) (LRB): RIGHT VIDEO ASSISTED THORACOSCOPY (VATS)/WEDGE RESECTION (Right)  1. CV-sinus brady, has been stable, BP stable 2. Pulm-on baseline 2L [place]. CXR showed: right chest tube removed, no pneumothorax.  3. D/C central line 4. Patient is tolerating a normal diet 5. + BM 6. Insomnia-Benadryl  added PRN  Plan: d/c central line. Discharge today.    LOS: 3 days    Elgie Collard 07/06/2016  I have seen and examined the patient and agree with the assessment and plan as outlined.  Rexene Alberts, MD 07/06/2016 10:32 AM

## 2016-07-11 ENCOUNTER — Encounter (INDEPENDENT_AMBULATORY_CARE_PROVIDER_SITE_OTHER): Payer: Self-pay

## 2016-07-11 DIAGNOSIS — Z4802 Encounter for removal of sutures: Secondary | ICD-10-CM

## 2016-07-28 ENCOUNTER — Other Ambulatory Visit: Payer: Self-pay | Admitting: Surgery

## 2016-07-28 DIAGNOSIS — C3490 Malignant neoplasm of unspecified part of unspecified bronchus or lung: Secondary | ICD-10-CM

## 2016-07-30 ENCOUNTER — Ambulatory Visit
Admission: RE | Admit: 2016-07-30 | Discharge: 2016-07-30 | Disposition: A | Discharge status: Home / Self Care | Payer: Medicaid Other | Source: Ambulatory Visit | Attending: Surgery | Admitting: Surgery

## 2016-07-30 ENCOUNTER — Encounter: Payer: Self-pay | Admitting: Surgery

## 2016-07-30 ENCOUNTER — Ambulatory Visit (INDEPENDENT_AMBULATORY_CARE_PROVIDER_SITE_OTHER): Payer: Self-pay | Admitting: Surgery

## 2016-07-30 VITALS — BP 107/64 | HR 84 | Resp 16 | Ht 69.0 in | Wt 144.0 lb

## 2016-07-30 DIAGNOSIS — C3412 Malignant neoplasm of upper lobe, left bronchus or lung: Secondary | ICD-10-CM

## 2016-07-30 DIAGNOSIS — Z09 Encounter for follow-up examination after completed treatment for conditions other than malignant neoplasm: Secondary | ICD-10-CM

## 2016-07-30 DIAGNOSIS — C3490 Malignant neoplasm of unspecified part of unspecified bronchus or lung: Secondary | ICD-10-CM

## 2016-07-30 NOTE — Progress Notes (Signed)
HPI: Patient returns for routine postoperative follow-up having undergone Right VATS with wedge resection of a 1.4 cm RUL lung nodule on 07/03/2016. The patient's early postoperative recovery while in the hospital was notable for an uncomplicated postop course. Since hospital discharge the patient reports that he has been feeling well. He has some itching of burning of the right chest wall that is probably related to the incisions. He continues to smoke 3 cigarettes per day. 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   Current Outpatient Prescriptions  Medication Sig Dispense Refill  . acetaminophen (TYLENOL) 500 MG tablet Take 500 mg by mouth every 6 (six) hours as needed for mild pain.    [name] albuterol (PROVENTIL HFA;VENTOLIN HFA) 108 (90 BASE) MCG/ACT inhaler Inhale 2 puffs into the lungs every 6 (six) hours as needed for wheezing or shortness of breath.    [name] aspirin EC 81 MG tablet Take 81 mg by mouth at bedtime.    . gabapentin (NEURONTIN) 300 MG capsule Take 1 capsule (300 mg total) by mouth 3 (three) times daily. (Patient taking differently: Take 300 mg by mouth daily as needed. ) 90 capsule 0  . methocarbamol (ROBAXIN) 500 MG tablet Take 1 tablet (500 mg total) by mouth every 6 (six) hours as needed for muscle spasms. 60 tablet 1  . omeprazole (PRILOSEC) 20 MG capsule Take 20 mg by mouth daily.    [name] oxyCODONE (OXY IR/ROXICODONE) 5 MG immediate release tablet Take 1 tablet (5 mg total) by mouth every 6 (six) hours as needed for severe pain. 30 tablet 0  . OXYGEN Inhale 2 L into the lungs at bedtime.    [name] umeclidinium-vilanterol (ANORO ELLIPTA) 62.5-25 MCG/INH AEPB Inhale 1 puff into the lungs daily. 1 each 0   No current facility-administered medications for this visit.     Physical Exam: BP 107/64 (BP Location: Right Arm, Patient Position: Sitting, Cuff Size: Normal)   Pulse 84   Resp 16   Ht 5\' 9"  (1.753 m)   Wt 144 lb  (65.3 kg)   SpO2 94% Comment: ON RA  BMI 21.27 kg/m  He looks well. Lung exam is clear. Cardiac exam shows a regular rate and rhythm with normal heart sounds. Chest incisions are healing well    Diagnostic Tests:  CLINICAL DATA:  Malignant neoplasm of the lung and status post right VATS procedure. Shortness of breath.  EXAM: CHEST  2 VIEW  COMPARISON:  07/06/2016  FINDINGS: Again noted are surgical clips in the left hilum. Central line has been removed since the previous examination. Stable hyperinflation with emphysematous disease. There is no focal airspace disease or pulmonary edema. No evidence for pneumothorax. Stable linear densities in the left lower chest compatible with scarring. No pleural fluid. Cervical spine surgical plate is partially visualized.  IMPRESSION: No active cardiopulmonary disease.  Emphysematous changes with postoperative changes.   Electronically Signed   By: Markus Daft M.D.   On: 07/30/2016 09:03   Impression:  He is doing well after resection of a second lung cancer. I discussed the pathology results with him. He does not require any additional therapy.  I strongly encouraged him to stop smoking as I have done many times before. He understands the importance but I doubt that he will ever quit. I told him that he can return to normal activity. I discussed the importance of continue surveillance due to his increased risk of further lung cancers.  Plan:  I will see him back in six months with a CT of the chest.   Gaye Pollack, MD [facility] [phone]

## 2016-08-12 ENCOUNTER — Encounter: Payer: Self-pay | Admitting: Emergency Medicine

## 2016-08-12 ENCOUNTER — Ambulatory Visit (INDEPENDENT_AMBULATORY_CARE_PROVIDER_SITE_OTHER): Payer: Medicaid Other | Admitting: Emergency Medicine

## 2016-08-12 DIAGNOSIS — J301 Allergic rhinitis due to pollen: Secondary | ICD-10-CM | POA: Diagnosis not present

## 2016-08-12 DIAGNOSIS — Z720 Tobacco use: Secondary | ICD-10-CM

## 2016-08-12 DIAGNOSIS — C3410 Malignant neoplasm of upper lobe, unspecified bronchus or lung: Secondary | ICD-10-CM | POA: Diagnosis not present

## 2016-08-12 DIAGNOSIS — J309 Allergic rhinitis, unspecified: Secondary | ICD-10-CM | POA: Insufficient documentation

## 2016-08-12 DIAGNOSIS — J449 Chronic obstructive pulmonary disease, unspecified: Secondary | ICD-10-CM | POA: Diagnosis not present

## 2016-08-12 MED ORDER — FLUTICASONE PROPIONATE 50 MCG/ACT NA SUSP: 2.0000 | Freq: Every day | NASAL | 5 refills | Status: DC

## 2016-08-12 NOTE — Assessment & Plan Note (Signed)
Please continue Anoro once daily Use albuterol 2 puffs as needed for shortness of breath, wheezing, cough Continue to work on decreasing her cigarettes. When you have cut down further we will need to consider setting a quit date to stop completely. Follow with Dr Lamonte Sakai in 6 months or sooner if you have any problems

## 2016-08-12 NOTE — Assessment & Plan Note (Signed)
Discussed decreasing his use today. He is not ready to set a quit date.

## 2016-08-12 NOTE — Assessment & Plan Note (Signed)
Serial CT scans, follow-up with Dr Cyndia Bent.

## 2016-08-12 NOTE — Assessment & Plan Note (Signed)
Will try starting fluticasone nasal spray.
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
## 2016-08-12 NOTE — Addendum Note (Signed)
Addended by: Desmond Dike C on: 08/12/2016 02:19 PM   Modules accepted: Orders

## 2016-08-12 NOTE — Patient Instructions (Signed)
Please continue Anoro once daily Use albuterol 2 puffs as needed for shortness of breath, wheezing, cough Please start fluticasone nasal spray, 2 sprays each nostril once a day Continue to work on decreasing her cigarettes. When you have cut down further we will need to consider setting a quit date to stop completely. Follow with thoracic surgery as planned Follow with Dr Lamonte Sakai in 6 months or sooner if you have any problems

## 2016-11-11 ENCOUNTER — Ambulatory Visit: Payer: Self-pay | Admitting: Cardiovascular Disease

## 2016-11-19 ENCOUNTER — Encounter (HOSPITAL_COMMUNITY): Payer: Self-pay | Admitting: Emergency Medicine

## 2016-11-19 ENCOUNTER — Emergency Department (HOSPITAL_COMMUNITY): Payer: Medicaid Other

## 2016-11-19 ENCOUNTER — Emergency Department (HOSPITAL_COMMUNITY)
Admission: EM | Admit: 2016-11-19 | Discharge: 2016-11-19 | Disposition: A | Discharge status: Home / Self Care | Payer: Medicaid Other | Attending: Emergency Medicine | Admitting: Emergency Medicine

## 2016-11-19 DIAGNOSIS — Z7982 Long term (current) use of aspirin: Secondary | ICD-10-CM | POA: Diagnosis not present

## 2016-11-19 DIAGNOSIS — F17210 Nicotine dependence, cigarettes, uncomplicated: Secondary | ICD-10-CM | POA: Insufficient documentation

## 2016-11-19 DIAGNOSIS — J449 Chronic obstructive pulmonary disease, unspecified: Secondary | ICD-10-CM | POA: Diagnosis not present

## 2016-11-19 DIAGNOSIS — I252 Old myocardial infarction: Secondary | ICD-10-CM | POA: Diagnosis not present

## 2016-11-19 DIAGNOSIS — R079 Chest pain, unspecified: Secondary | ICD-10-CM | POA: Diagnosis not present

## 2016-11-19 DIAGNOSIS — Z79899 Other long term (current) drug therapy: Secondary | ICD-10-CM | POA: Insufficient documentation

## 2016-11-19 DIAGNOSIS — I2510 Atherosclerotic heart disease of native coronary artery without angina pectoris: Secondary | ICD-10-CM | POA: Diagnosis not present

## 2016-11-19 LAB — BASIC METABOLIC PANEL
Anion gap: 10 (ref 5–15)
BUN: 9 mg/dL (ref 6–20)
CO2: 23 mmol/L (ref 22–32)
Calcium: 8.9 mg/dL (ref 8.9–10.3)
Chloride: 104 mmol/L (ref 101–111)
Creatinine, Ser: 1 mg/dL (ref 0.61–1.24)
GFR calc Af Amer: >60 mL/min (ref >60)
GFR calc non Af Amer: >60 mL/min (ref >60)
Glucose, Bld: 87 mg/dL (ref 65–99)
Potassium: 4 mmol/L (ref 3.5–5.1)
Sodium: 137 mmol/L (ref 135–145)

## 2016-11-19 LAB — CBC
HCT: 43.8 % (ref 39.0–52.0)
Hemoglobin: 14.4 g/dL (ref 13.0–17.0)
MCH: 31.3 pg (ref 26.0–34.0)
MCHC: 32.9 g/dL (ref 30.0–36.0)
MCV: 95.2 fL (ref 78.0–100.0)
Platelets: 263 10*3/uL (ref 150–400)
RBC: 4.6 MIL/uL (ref 4.22–5.81)
RDW: 15.3 % (ref 11.5–15.5)
WBC: 7.9 10*3/uL (ref 4.0–10.5)

## 2016-11-19 LAB — I-STAT TROPONIN, ED
Troponin i, poc: 0 ng/mL (ref 0.00–0.08)
Troponin i, poc: 0 ng/mL (ref 0.00–0.08)

## 2016-11-19 MED ORDER — LIDOCAINE 5 % EX PTCH
1.0000 | MEDICATED_PATCH | CUTANEOUS | Status: DC
  Administered 2016-11-19: 1 via TRANSDERMAL
  Filled 2016-11-19: qty 1

## 2016-11-19 MED ORDER — ACETAMINOPHEN 500 MG PO TABS
1000.0000 mg | ORAL_TABLET | Freq: Once | ORAL | Status: AC
  Administered 2016-11-19: 1000 mg via ORAL
  Filled 2016-11-19: qty 2

## 2016-11-19 NOTE — ED Notes (Signed)
Happy meal withy sprite provided per [facility]

## 2016-11-19 NOTE — ED Triage Notes (Signed)
Pt to ER for evaluation of left lower chest tightness onset 3 days ago with shortness of breath and nausea. States pain 3/10 now. NAD at triage.
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
## 2016-11-19 NOTE — Discharge Instructions (Signed)
Your workup today did not reveal any concerning cause of your chest pain. We advise Tylenol or ibuprofen for persistent pain. Continue follow-up with your cardiologist as scheduled. You may call your cardiologist to see if you can have an appointment sooner given your recent presentation to the emergency department. Return for new or concerning symptoms.

## 2016-12-16 ENCOUNTER — Ambulatory Visit: Payer: Medicaid Other | Admitting: Cardiovascular Disease

## 2016-12-16 ENCOUNTER — Encounter: Payer: Self-pay | Admitting: Cardiovascular Disease

## 2016-12-16 VITALS — BP 98/70 | HR 50 | Ht 69.0 in | Wt 142.8 lb

## 2016-12-16 DIAGNOSIS — I2510 Atherosclerotic heart disease of native coronary artery without angina pectoris: Secondary | ICD-10-CM

## 2016-12-16 DIAGNOSIS — I499 Cardiac arrhythmia, unspecified: Secondary | ICD-10-CM | POA: Diagnosis not present

## 2016-12-16 DIAGNOSIS — Z720 Tobacco use: Secondary | ICD-10-CM | POA: Diagnosis not present
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
## 2016-12-16 NOTE — Patient Instructions (Signed)

## 2016-12-24 ENCOUNTER — Other Ambulatory Visit: Payer: Self-pay | Admitting: *Deleted

## 2016-12-24 DIAGNOSIS — Z85118 Personal history of other malignant neoplasm of bronchus and lung: Secondary | ICD-10-CM

## 2017-01-14 ENCOUNTER — Other Ambulatory Visit: Payer: Self-pay | Admitting: Emergency Medicine

## 2017-01-28 ENCOUNTER — Other Ambulatory Visit: Payer: Self-pay

## 2017-01-28 ENCOUNTER — Encounter: Payer: Self-pay | Admitting: Surgery

## 2017-01-28 ENCOUNTER — Ambulatory Visit
Admission: RE | Admit: 2017-01-28 | Discharge: 2017-01-28 | Disposition: A | Discharge status: Home / Self Care | Payer: Medicaid Other | Source: Ambulatory Visit | Attending: Surgery | Admitting: Surgery

## 2017-01-28 ENCOUNTER — Ambulatory Visit (INDEPENDENT_AMBULATORY_CARE_PROVIDER_SITE_OTHER): Payer: Medicaid Other | Admitting: Surgery

## 2017-01-28 VITALS — BP 104/74 | HR 76 | Resp 16 | Ht 69.0 in | Wt 142.6 lb

## 2017-01-28 DIAGNOSIS — Z902 Acquired absence of lung [part of]: Secondary | ICD-10-CM | POA: Diagnosis not present

## 2017-01-28 DIAGNOSIS — C3412 Malignant neoplasm of upper lobe, left bronchus or lung: Secondary | ICD-10-CM | POA: Diagnosis not present

## 2017-01-28 DIAGNOSIS — C3411 Malignant neoplasm of upper lobe, right bronchus or lung: Secondary | ICD-10-CM

## 2017-01-28 DIAGNOSIS — Z85118 Personal history of other malignant neoplasm of bronchus and lung: Secondary | ICD-10-CM

## 2017-01-30 ENCOUNTER — Encounter: Payer: Self-pay | Admitting: Surgery

## 2017-01-30 NOTE — Progress Notes (Signed)
HPI:  Patient returns for routine surveillance having undergone Right VATS with wedge resection of a 1.4 cm RUL lung nodule on 07/03/2016. 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 This is his second lung cancer resection having undergone left upper lobectomy on 06/12/2011 for a stage IB (T2A, N0, M0) non-small cell lung cancer consistent with adenocarcinoma. This was a 2.0 cm tumor with visceral pleural invasion. He continues to smoke about 6 cigarettes per day.      Current Outpatient Medications  Medication Sig Dispense Refill  . acetaminophen (TYLENOL) 500 MG tablet Take 500 mg by mouth every 6 (six) hours as needed for mild pain.    [name] albuterol (PROVENTIL HFA;VENTOLIN HFA) 108 (90 BASE) MCG/ACT inhaler Inhale 2 puffs into the lungs every 6 (six) hours as needed for wheezing or shortness of breath.    Jearl Klinefelter ELLIPTA 62.5-25 MCG/INH AEPB INHALE 1 PUFF BY MOUTH DAILY 60 each 2  . aspirin EC 81 MG tablet Take 81 mg by mouth at bedtime.    . fluticasone (FLONASE) 50 MCG/ACT nasal spray Place 2 sprays into both nostrils daily. 16 g 5  . gabapentin (NEURONTIN) 300 MG capsule Take 1 capsule (300 mg total) by mouth 3 (three) times daily. (Patient taking differently: Take 300 mg by mouth daily as needed. ) 90 capsule 0  . methocarbamol (ROBAXIN) 500 MG tablet Take 1 tablet (500 mg total) by mouth every 6 (six) hours as needed for muscle spasms. 60 tablet 1  . omeprazole (PRILOSEC) 20 MG capsule Take 20 mg by mouth daily.    [name] oxyCODONE (OXY IR/ROXICODONE) 5 MG immediate release tablet Take 1 tablet (5 mg total) by mouth every 6 (six) hours as needed for severe pain. 30 tablet 0  . OXYGEN Inhale 2 L into the lungs at bedtime.    [name] umeclidinium-vilanterol (ANORO ELLIPTA) 62.5-25 MCG/INH AEPB Inhale 1 puff into the lungs daily. 1 each 0   No current facility-administered medications for this visit.      Physical Exam: BP 104/74 (BP  Location: Right Arm, Patient Position: Sitting, Cuff Size: Large)   Pulse 76 Comment: SL IRREG  Resp 16   Ht 5\' 9"  (1.753 m)   Wt 142 lb 9.6 oz (64.7 kg)   SpO2 96% Comment: ON RA  BMI 21.06 kg/m   He looks well.] There is no cervical or supraclavicular adenopathy. Lung exam is clear. Cardiac exam shows a regular rate and rhythm with normal heart sounds. His chest scars look good.   Diagnostic Tests:  CLINICAL DATA:  61 year old male with upper today and left-sided chest pain, shortness breath and cough. History of lung cancer status post surgical resection. Followup study.  EXAM: CT CHEST WITHOUT CONTRAST  TECHNIQUE: Multidetector CT imaging of the chest was performed following the standard protocol without IV contrast.  COMPARISON:  Chest CT 05/21/2016.  FINDINGS: Cardiovascular: Heart size is normal. There is no significant pericardial fluid, thickening or pericardial calcification. There is aortic atherosclerosis, as well as atherosclerosis of the great vessels of the mediastinum and the coronary arteries, including calcified atherosclerotic plaque in the left main, left anterior descending, left circumflex and right coronary arteries.  Mediastinum/Nodes: No pathologically enlarged mediastinal or hilar lymph nodes. Please note that accurate exclusion of hilar adenopathy is limited on noncontrast CT scans. Esophagus is unremarkable in appearance. No axillary lymphadenopathy.  Lungs/Pleura: Previously noted nodule in the medial aspect of the right upper lobe apex has been  resected compared to the prior study with a new suture line noted in this region. No nodularity in this area to suggest locally recurrent disease. No other new suspicious appearing pulmonary nodules or masses are noted. No acute consolidative airspace disease. No pleural effusions. Diffuse bronchial wall thickening with moderate centrilobular and paraseptal emphysema. Status post left  upper lobectomy with compensatory hyperexpansion of the left lower lobe.  Upper Abdomen: Aortic atherosclerosis.  Musculoskeletal: There are no aggressive appearing lytic or blastic lesions noted in the visualized portions of the skeleton.  IMPRESSION: 1. Status post wedge resection in the right upper lobe. No finding to suggest local recurrence of disease or new metastatic disease in the thorax. Patient has also had prior a left upper lobectomy. 2. Aortic atherosclerosis, in addition to three-vessel coronary artery disease. Please note that although the presence of coronary artery calcium documents the presence of coronary artery disease, the severity of this disease and any potential stenosis cannot be assessed on this non-gated CT examination. Assessment for potential risk factor modification, dietary therapy or pharmacologic therapy may be warranted, if clinically indicated.  Aortic Atherosclerosis (ICD10-I70.0).   Electronically Signed   By: Vinnie Langton M.D.   On: 01/28/2017 13:27  Impression:  He continues to do well with no evidence of recurrent cancer on CT scan of the chest. I discussed the importance of smoking cessation with him given his severe COPD and two lung cancer resections. He understands this and knows that he needs to quit. He was given the number for the smoking cessation program at [facility].  Plan:  I will see him back in six months with a CT scan of the chest.  I spent 15 minutes performing this established patient evaluation and > 50% of this time was spent face to face counseling and coordinating the care of this patient's lung cancer surveillance.    Gaye Pollack, MD [facility] [phone]

## 2017-02-09 ENCOUNTER — Encounter: Payer: Self-pay | Admitting: Emergency Medicine

## 2017-02-09 ENCOUNTER — Ambulatory Visit (INDEPENDENT_AMBULATORY_CARE_PROVIDER_SITE_OTHER): Payer: Medicaid Other | Admitting: Emergency Medicine

## 2017-02-09 VITALS — BP 118/70 | HR 74 | Ht 69.0 in | Wt 145.0 lb

## 2017-02-09 DIAGNOSIS — K219 Gastro-esophageal reflux disease without esophagitis: Secondary | ICD-10-CM | POA: Diagnosis not present

## 2017-02-09 DIAGNOSIS — R070 Pain in throat: Secondary | ICD-10-CM | POA: Insufficient documentation

## 2017-02-09 DIAGNOSIS — F17210 Nicotine dependence, cigarettes, uncomplicated: Secondary | ICD-10-CM | POA: Diagnosis not present

## 2017-02-09 DIAGNOSIS — J301 Allergic rhinitis due to pollen: Secondary | ICD-10-CM

## 2017-02-09 DIAGNOSIS — Z720 Tobacco use: Secondary | ICD-10-CM

## 2017-02-09 DIAGNOSIS — C3410 Malignant neoplasm of upper lobe, unspecified bronchus or lung: Secondary | ICD-10-CM | POA: Diagnosis not present

## 2017-02-09 DIAGNOSIS — J449 Chronic obstructive pulmonary disease, unspecified: Secondary | ICD-10-CM | POA: Diagnosis not present

## 2017-02-09 MED ORDER — AZELASTINE HCL 0.1 % NA SOLN: 2.0000 | Freq: Two times a day (BID) | NASAL | 5 refills | Status: DC | PRN

## 2017-02-09 MED ORDER — AMOXICILLIN-POT CLAVULANATE 875-125 MG PO TABS: 1.0000 | ORAL_TABLET | Freq: Two times a day (BID) | ORAL | 0 refills | Status: DC

## 2017-02-09 MED ORDER — OMEPRAZOLE 40 MG PO CPDR: 40.0000 mg | DELAYED_RELEASE_CAPSULE | Freq: Every day | ORAL | 5 refills | Status: DC

## 2017-02-09 MED ORDER — NICOTINE 21 MG/24HR TD PT24: 21.0000 mg | MEDICATED_PATCH | Freq: Every day | TRANSDERMAL | 0 refills | Status: DC

## 2017-02-09 NOTE — Assessment & Plan Note (Signed)
Most recent follow-up imaging reassuring.  He is following with Dr. Cyndia Bent

## 2017-02-09 NOTE — Assessment & Plan Note (Signed)
Smoking with him today.  He is going to a quit Smart class.  He wants to start nicotine patches to help him set a quit date and I will order these for him today.

## 2017-02-09 NOTE — Patient Instructions (Signed)
Please increase you omeprazole to 40mg  once a day.  To avoid spicy foods Congratulations on your goal to stop smoking.  Continue your quit smart class Start nicotine patches 21 mg once daily Please take Augmentin 875 mg twice a day for the next 7 days Continue Flonase nasal spray, 2 sprays each nostril once a day Try starting Astelin nasal spray, 2 sprays each nostril 2-3 times a day as needed for nasal drainage We will refer you to ENT for evaluation of your sinuses and throat  Follow with Dr Lamonte Sakai in 2 months or sooner if you have any problems.

## 2017-02-09 NOTE — Assessment & Plan Note (Signed)
Etiology unclear.  He describes a burning that involves his throat and extends up into his sinuses and ears.  Question whether some of this may be GERD related or even allergy related.  He says that it is worse when he has a spicy meal.  I do not see any evidence for thrush on physical exam.  He wonders whether he needs an antibiotic.  I will treat him empirically for possible sinusitis although he does not have any other hallmark findings consistent with this.  I will also increase his omeprazole to see if this helps.  Refer him to ENT for evaluation of both his nasopharynx, sinuses, throat

## 2017-02-09 NOTE — Assessment & Plan Note (Signed)
Continue current bronchodilators
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
## 2017-02-09 NOTE — Assessment & Plan Note (Signed)
His throat burning sounds most consistent with GERD.  I will increase his omeprazole to 40 mg daily and follow.  ENT evaluation as below

## 2017-02-09 NOTE — Assessment & Plan Note (Signed)
  Continue Flonase nasal spray, 2 sprays each nostril once a day Try starting Astelin nasal spray, 2 sprays each nostril 2-3 times a day as needed for nasal drainage

## 2017-02-13 DIAGNOSIS — K219 Gastro-esophageal reflux disease without esophagitis: Secondary | ICD-10-CM | POA: Insufficient documentation

## 2017-02-27 ENCOUNTER — Telehealth: Payer: Self-pay | Admitting: Emergency Medicine

## 2017-02-27 MED ORDER — UMECLIDINIUM-VILANTEROL 62.5-25 MCG/INH IN AEPB: 1.0000 | INHALATION_SPRAY | Freq: Every day | RESPIRATORY_TRACT | 0 refills | Status: DC

## 2017-02-27 MED ORDER — ALBUTEROL SULFATE HFA 108 (90 BASE) MCG/ACT IN AERS: 2.0000 | INHALATION_SPRAY | Freq: Four times a day (QID) | RESPIRATORY_TRACT | 3 refills | Status: DC | PRN

## 2017-02-27 MED ORDER — TIOTROPIUM BROMIDE-OLODATEROL 2.5-2.5 MCG/ACT IN AERS: 2.0000 | INHALATION_SPRAY | Freq: Every day | RESPIRATORY_TRACT | 0 refills | Status: DC

## 2017-02-27 NOTE — Telephone Encounter (Signed)
Pt is in the lobby requesting sample of Anoro.

## 2017-02-27 NOTE — Telephone Encounter (Signed)
Spoke with patient. He wanted a sample of Anoro since [facility] will not have any in stock until Monday. Advised him that we do not have any Anoro samples.   Spoke with RB. He stated that the patient could try Stiolto over the weekend until [place] will have the Anoro in stock.   Explained to patient how to use Stiolto. Patient verbalized understanding. RX for Anoro and Proair have been sent in for him.

## 2017-04-10 ENCOUNTER — Ambulatory Visit: Payer: Medicaid Other | Admitting: Emergency Medicine

## 2017-05-01 ENCOUNTER — Emergency Department (HOSPITAL_COMMUNITY)
Admission: EM | Admit: 2017-05-01 | Discharge: 2017-05-02 | Disposition: A | Discharge status: Home / Self Care | Payer: Medicaid Other | Attending: Physician Assistant | Admitting: Physician Assistant

## 2017-05-01 ENCOUNTER — Emergency Department (HOSPITAL_COMMUNITY): Payer: Medicaid Other

## 2017-05-01 ENCOUNTER — Encounter (HOSPITAL_COMMUNITY): Payer: Self-pay | Admitting: Emergency Medicine

## 2017-05-01 ENCOUNTER — Other Ambulatory Visit: Payer: Self-pay

## 2017-05-01 DIAGNOSIS — R079 Chest pain, unspecified: Secondary | ICD-10-CM | POA: Insufficient documentation

## 2017-05-01 DIAGNOSIS — I252 Old myocardial infarction: Secondary | ICD-10-CM | POA: Insufficient documentation

## 2017-05-01 DIAGNOSIS — I2510 Atherosclerotic heart disease of native coronary artery without angina pectoris: Secondary | ICD-10-CM | POA: Diagnosis not present

## 2017-05-01 DIAGNOSIS — Z85118 Personal history of other malignant neoplasm of bronchus and lung: Secondary | ICD-10-CM | POA: Insufficient documentation

## 2017-05-01 DIAGNOSIS — F17210 Nicotine dependence, cigarettes, uncomplicated: Secondary | ICD-10-CM | POA: Insufficient documentation

## 2017-05-01 DIAGNOSIS — Z79899 Other long term (current) drug therapy: Secondary | ICD-10-CM | POA: Insufficient documentation

## 2017-05-01 DIAGNOSIS — J449 Chronic obstructive pulmonary disease, unspecified: Secondary | ICD-10-CM | POA: Insufficient documentation

## 2017-05-01 DIAGNOSIS — Z7982 Long term (current) use of aspirin: Secondary | ICD-10-CM | POA: Insufficient documentation

## 2017-05-01 LAB — I-STAT TROPONIN, ED
Troponin i, poc: 0 ng/mL (ref 0.00–0.08)
Troponin i, poc: 0 ng/mL (ref 0.00–0.08)

## 2017-05-01 LAB — CBC
HCT: 44.9 % (ref 39.0–52.0)
Hemoglobin: 15.3 g/dL (ref 13.0–17.0)
MCH: 32.6 pg (ref 26.0–34.0)
MCHC: 34.1 g/dL (ref 30.0–36.0)
MCV: 95.7 fL (ref 78.0–100.0)
Platelets: 289 10*3/uL (ref 150–400)
RBC: 4.69 MIL/uL (ref 4.22–5.81)
RDW: 15.4 % (ref 11.5–15.5)
WBC: 10.7 10*3/uL — ABNORMAL HIGH (ref 4.0–10.5)

## 2017-05-01 LAB — BASIC METABOLIC PANEL
Anion gap: 8 (ref 5–15)
BUN: 17 mg/dL (ref 6–20)
CO2: 26 mmol/L (ref 22–32)
Calcium: 9.2 mg/dL (ref 8.9–10.3)
Chloride: 105 mmol/L (ref 101–111)
Creatinine, Ser: 1.24 mg/dL (ref 0.61–1.24)
GFR calc Af Amer: >60 mL/min (ref >60)
GFR calc non Af Amer: >60 mL/min (ref >60)
Glucose, Bld: 86 mg/dL (ref 65–99)
Potassium: 4.9 mmol/L (ref 3.5–5.1)
Sodium: 139 mmol/L (ref 135–145)

## 2017-05-01 MED ORDER — FENTANYL CITRATE (PF) 100 MCG/2ML IJ SOLN
50.0000 ug | Freq: Once | INTRAMUSCULAR | Status: AC
  Administered 2017-05-01: 50 ug via INTRAVENOUS
  Filled 2017-05-01: qty 2

## 2017-05-01 MED ORDER — ASPIRIN 81 MG PO CHEW
324.0000 mg | CHEWABLE_TABLET | Freq: Once | ORAL | Status: DC
  Filled 2017-05-01: qty 4

## 2017-05-01 NOTE — Discharge Instructions (Addendum)
We want you to call your cardiologist in the morning to scheudle follow up. You may need an office visit or you may need a stress test to reevaluate your heart.   Like we said we are unsure what caused the pain today, so please watch your symptoms carefully.  You may need to reutrn.  Please return with any chest pain, shortness of breath, or other concerns.

## 2017-05-01 NOTE — ED Provider Notes (Signed)
CP, LE edema H/o crack cocaine use Has a low heart score Atypical pain, achiness  Plan: delta, CXR re-eval  Delta trop is negative. CXR shows not acute findings. Patient complained of increased pain - 50 mcg Fentanyl provided.   He is feeling better. VSS. He can be discharged home with recommendation for PCP follow up for recheck early next week.    Charlann Lange, PA-C 05/02/17 0015    Macarthur Critchley, MD 05/07/17 [phone]

## 2017-05-01 NOTE — ED Notes (Signed)
Pt states he had 4 baby aspirin earlier today.
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
## 2017-05-01 NOTE — ED Triage Notes (Addendum)
Report from [facility]> Pt reports dull pain in center of chest that radiates to L shoulder that started at 1pm while walking.  Denies sob, nausea, and vomiting.  EMS administered ASA 324mg  and NTG x1 SL.  Pain decreased from 5/10-3/10 after NTG.  BP 89/62 after NTG.

## 2017-05-02 NOTE — ED Notes (Signed)
Pt verbalizes understanding of d/c instructions. Pt ambulatory at d/c with all belongings.   

## 2017-05-07 ENCOUNTER — Ambulatory Visit: Payer: Medicaid Other | Admitting: Emergency Medicine

## 2017-06-01 ENCOUNTER — Encounter: Payer: Self-pay | Admitting: Emergency Medicine

## 2017-06-01 ENCOUNTER — Other Ambulatory Visit: Payer: Medicaid Other

## 2017-06-01 ENCOUNTER — Ambulatory Visit (INDEPENDENT_AMBULATORY_CARE_PROVIDER_SITE_OTHER): Payer: Medicaid Other | Admitting: Emergency Medicine

## 2017-06-01 VITALS — BP 114/76 | HR 83 | Ht 69.0 in | Wt 144.0 lb

## 2017-06-01 DIAGNOSIS — R079 Chest pain, unspecified: Secondary | ICD-10-CM

## 2017-06-01 DIAGNOSIS — J449 Chronic obstructive pulmonary disease, unspecified: Secondary | ICD-10-CM

## 2017-06-01 LAB — D-DIMER, QUANTITATIVE (NOT AT ARMC): D-Dimer, Quant: 0.59 mcg/mL FEU — ABNORMAL HIGH (ref <0.50)

## 2017-06-01 MED ORDER — HYDROCODONE-ACETAMINOPHEN 5-325 MG PO TABS: 1.0000 | ORAL_TABLET | Freq: Four times a day (QID) | ORAL | 0 refills | Status: DC | PRN
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
## 2017-06-01 NOTE — Assessment & Plan Note (Addendum)
Chest pain is atypical in nature.  He had a negative EKG and troponins on an ED visit recently.  Pain waxes and wanes, can be sharp or pleuritic in nature.  There appears to be an almost musculoskeletal component.  He has been managed for esophageal reflux by Dr. Redmond Baseman with ENT.  His PPI was recently increased.  I think this is most likely musculoskeletal but he cannot recall any inciting event, he is not having a lot of cough, etc.  Given his history of malignancy I believe we are obliged to check a d-dimer, gauge our suspicion for pulmonary embolism.  If suspicion is high then we can consider performing a CT scan of his chest with contrast sooner than his original repeat scan to look for interval change, pleural disease and also pulmonary embolism. Small script for vicodin given. Explained that I do not plan to refill.

## 2017-06-01 NOTE — Assessment & Plan Note (Signed)
Severe COPD.  We will continue his Anoro as ordered, albuterol if needed.

## 2017-06-01 NOTE — Patient Instructions (Signed)
Blood work today.  Depending on your lab work we may decide to repeat your Ct scan of the chest early.  Please continue your Anoro once a day as you are taking it.  Keep albuterol available to use as needed for shortness of breath We will give you a one-time prescription for vicodin that you can use up to every 6 hours if needed for your chest pain. trhis is not a medication that we will be able to refill for you.  Follow with Dr Lamonte Sakai next available.

## 2017-06-02 ENCOUNTER — Telehealth: Payer: Self-pay | Admitting: Emergency Medicine

## 2017-06-02 DIAGNOSIS — R079 Chest pain, unspecified: Secondary | ICD-10-CM

## 2017-06-02 NOTE — Telephone Encounter (Signed)
[name] approved patient for Anoro.  Approval dates are 06/02/2017-05/28/2018.  Approval number is [id number]  Patient's pharmacy called: they state medication has already been filled  Patient called to verify but unable to speak with patient. Left message to call back

## 2017-06-02 NOTE — Telephone Encounter (Signed)
Amado Coe, RN       06/02/17 12:04 PM  Note    [address] [employer] approved patient for [employer].  Approval dates are 06/02/2017-05/28/2018.  Approval number is [id number]  Patient's pharmacy called: they state medication has already been filled  Patient called to verify but unable to speak with patient. Left message to call back     Spoke with pt, advised message above. He is requesting his lab results. RB please advise.

## 2017-06-03 NOTE — Telephone Encounter (Signed)
Spoke with pt, aware of recs.  Ct angio ordered.  Nothing further needed.

## 2017-06-03 NOTE — Telephone Encounter (Signed)
atc pt at only number listed on file, number is not a working number. Called emergency contact [place] (dpr on file), and lm to have pt call office back.  Will order ct angio after speaking to pt.

## 2017-06-03 NOTE — Telephone Encounter (Signed)
Please let him know that his d-dimer is slightly elevated, therefor I cannot absolutely rule out a blood clot as a cause of his pain. For this reason I will check his CT chest earlier than we had planned.   Please order CT angio chest to r/o PE, thanks

## 2017-06-03 NOTE — Telephone Encounter (Signed)
Pt is calling back [phone]

## 2017-06-04 ENCOUNTER — Ambulatory Visit (INDEPENDENT_AMBULATORY_CARE_PROVIDER_SITE_OTHER)
Admission: RE | Admit: 2017-06-04 | Discharge: 2017-06-04 | Disposition: A | Discharge status: Home / Self Care | Payer: Medicaid Other | Source: Ambulatory Visit | Attending: Emergency Medicine | Admitting: Emergency Medicine

## 2017-06-04 DIAGNOSIS — R079 Chest pain, unspecified: Secondary | ICD-10-CM | POA: Diagnosis not present

## 2017-06-04 MED ORDER — IOPAMIDOL (ISOVUE-370) INJECTION 76%
80.0000 mL | Freq: Once | INTRAVENOUS | Status: AC | PRN
  Administered 2017-06-04: 80 mL via INTRAVENOUS

## 2017-06-15 ENCOUNTER — Telehealth: Payer: Self-pay | Admitting: Emergency Medicine

## 2017-06-15 NOTE — Telephone Encounter (Signed)
Called and spoke with patient regarding CT scan on chest last week. At this time the results have not been reviewed at this time. Checked RB nurses folder to make sure, no results are in there at this time. Pt verbalized understanding that once results are in and reviewed by RB a nurse will call him with results. Nothing further needed at this time.

## 2017-07-02 ENCOUNTER — Other Ambulatory Visit: Payer: Self-pay | Admitting: Surgery

## 2017-07-02 DIAGNOSIS — C3490 Malignant neoplasm of unspecified part of unspecified bronchus or lung: Secondary | ICD-10-CM

## 2017-07-08 ENCOUNTER — Other Ambulatory Visit: Payer: Self-pay | Admitting: Emergency Medicine

## 2017-07-14 ENCOUNTER — Encounter: Payer: Self-pay | Admitting: Emergency Medicine

## 2017-07-14 ENCOUNTER — Ambulatory Visit: Payer: Medicaid Other | Admitting: Emergency Medicine

## 2017-07-14 DIAGNOSIS — J449 Chronic obstructive pulmonary disease, unspecified: Secondary | ICD-10-CM

## 2017-07-14 DIAGNOSIS — C3410 Malignant neoplasm of upper lobe, unspecified bronchus or lung: Secondary | ICD-10-CM

## 2017-07-14 DIAGNOSIS — J301 Allergic rhinitis due to pollen: Secondary | ICD-10-CM | POA: Diagnosis not present

## 2017-07-14 MED ORDER — AZELASTINE HCL 0.1 % NA SOLN: 2.0000 | Freq: Two times a day (BID) | NASAL | 5 refills | Status: DC | PRN

## 2017-07-14 NOTE — Assessment & Plan Note (Signed)
He has had benefit from the Astelin nasal spray and I will refill it today.

## 2017-07-14 NOTE — Assessment & Plan Note (Signed)
With resections, followed by TCT S.  He is scheduled for a CT 6/29 but since we recently did the CT-PA he may not need to have it done at that time.  We will message their office to let Dr. Cyndia Bent decide.

## 2017-07-14 NOTE — Assessment & Plan Note (Signed)
Fairly well-controlled on his Anoro.  Probably the most important single active issue is that he continues to smoke.  We talked about cessation today.  We will continue his regimen as ordered.

## 2017-07-14 NOTE — Patient Instructions (Addendum)
We will refill your astelin nasal spray. Use 2 sprays each nostril up to 3 times a day to help with nasal drainage.  Please continue Anoro as you have been taking it. Keep albuterol available to use 2 puffs if needed for shortness of breath, chest tightness, wheezing. Since you just had your CT scan of the chest you may not need to have the scheduled CT in late June.  We will message Dr. Vivi Martens office to see if you can cancel the scan.  Follow with Dr Lamonte Sakai in 6 months or sooner if you have any problems
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
## 2017-07-29 ENCOUNTER — Encounter: Payer: Self-pay | Admitting: Surgery

## 2017-07-29 ENCOUNTER — Other Ambulatory Visit: Payer: Medicaid Other

## 2017-07-29 ENCOUNTER — Other Ambulatory Visit: Payer: Self-pay

## 2017-07-29 ENCOUNTER — Ambulatory Visit: Payer: Medicaid Other | Admitting: Surgery

## 2017-07-29 VITALS — BP 92/60 | HR 56 | Resp 18 | Ht 69.0 in | Wt 142.0 lb

## 2017-07-29 DIAGNOSIS — Z902 Acquired absence of lung [part of]: Secondary | ICD-10-CM

## 2017-07-29 NOTE — Progress Notes (Signed)
HPI:  Patient returns for routine surveillance having undergoneRight VATS with wedge resection of a 1.4 cm RUL lung noduleon 07/03/2016. 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 This is his second lung cancer resection having undergone left upper lobectomy on 06/12/2011 for a stage IB (T2A, N0, M0) non-small cell lung cancer consistent with adenocarcinoma. This was a 2.0 cm tumor with visceral pleural invasion.  He feels okay overall.  He has some exertional shortness of breath due to severe COPD and is followed by Dr. Lamonte Sakai who he saw this month.  He continues to smoke 1 pack of cigarettes per day.  He denies any headaches or visual changes.  He has had no hemoptysis or sputum production.  He denies any muscle or bone pain.    Current Outpatient Medications  Medication Sig Dispense Refill  . acetaminophen (TYLENOL) 500 MG tablet Take 500 mg by mouth every 6 (six) hours as needed for mild pain.    [name] albuterol (PROVENTIL HFA;VENTOLIN HFA) 108 (90 Base) MCG/ACT inhaler Inhale 2 puffs into the lungs every 6 (six) hours as needed for wheezing or shortness of breath. 1 Inhaler 3  . ANORO ELLIPTA 62.5-25 MCG/INH AEPB INHALE 1 PUFF BY MOUTH DAILY 60 each 2  . ANORO ELLIPTA 62.5-25 MCG/INH AEPB INHALE 1 PUFF BY MOUTH DAILY 60 each 5  . aspirin EC 81 MG tablet Take 81 mg by mouth at bedtime.    [name] azelastine (ASTELIN) 0.1 % nasal spray Place 2 sprays into both nostrils 2 (two) times daily as needed for rhinitis. 30 mL 5  . fluticasone (FLONASE) 50 MCG/ACT nasal spray Place 2 sprays into both nostrils daily. 16 g 5  . gabapentin (NEURONTIN) 300 MG capsule Take 1 capsule (300 mg total) by mouth 3 (three) times daily. (Patient taking differently: Take 300 mg by mouth daily as needed. ) 90 capsule 0  . HYDROcodone-acetaminophen (NORCO/VICODIN) 5-325 MG tablet Take 1 tablet by mouth every 6 (six) hours as needed for moderate pain. 20 tablet 0  .  methocarbamol (ROBAXIN) 500 MG tablet Take 1 tablet (500 mg total) by mouth every 6 (six) hours as needed for muscle spasms. 60 tablet 1  . omeprazole (PRILOSEC) 40 MG capsule Take 1 capsule (40 mg total) by mouth daily. 30 capsule 5  . oxyCODONE (OXY IR/ROXICODONE) 5 MG immediate release tablet Take 1 tablet (5 mg total) by mouth every 6 (six) hours as needed for severe pain. 30 tablet 0  . OXYGEN Inhale 2 L into the lungs at bedtime.     No current facility-administered medications for this visit.      Physical Exam: BP 92/60 (BP Location: Left Arm, Patient Position: Sitting, Cuff Size: Normal)   Pulse (!) 56   Resp 18   Ht 5\' 9"  (1.753 m)   Wt 142 lb (64.4 kg)   SpO2 95% Comment: RA  BMI 20.97 kg/m  He looks well. There is no cervical or supraclavicular adenopathy. Lung exam is clear. The scars on both sides of the chest look fine. Abdominal exam shows no masses or organomegaly.  Diagnostic Tests:  CLINICAL DATA:  Chest pain and shortness of breath. Previous lobectomy for carcinoma on the left  EXAM: CT ANGIOGRAPHY CHEST WITH CONTRAST  TECHNIQUE: Multidetector CT imaging of the chest was performed using the standard protocol during bolus administration of intravenous contrast. Multiplanar CT image reconstructions and MIPs were obtained to evaluate the vascular anatomy.  CONTRAST:  65mL  ISOVUE-370 IOPAMIDOL (ISOVUE-370) INJECTION 76%  COMPARISON:  Chest CT January 28, 2017; chest radiograph May 01, 2017  FINDINGS: Cardiovascular: There is no demonstrable pulmonary embolus. There is no thoracic aortic aneurysm or dissection. The visualized great vessels appear unremarkable. There are foci of atherosclerotic calcification in the aorta. There are foci of coronary artery calcification. There is no appreciable pericardial effusion or pericardial thickening.  Mediastinum/Nodes: Thyroid appears unremarkable. There are subcentimeter mediastinal lymph nodes.  There is no adenopathy based on size criteria in the thoracic region. No esophageal lesions are evident.  Lungs/Pleura: There is under centrilobular and paraseptal emphysematous change. There is evidence of postoperative change on the left as well as in the right apex, stable. There are scattered areas of scarring bilaterally, most notably in the left lower lobe region. There is no parenchymal lung mass lesion or consolidation. No appreciable edema. No pleural effusion or pleural thickening evident.  Upper Abdomen: There is atherosclerotic calcification in the aorta. Visualized upper abdominal structures otherwise appear unremarkable.  Musculoskeletal: There are no blastic or lytic bone lesions. There is no chest wall lesion.  Review of the MIP images confirms the above findings.  IMPRESSION: 1. No demonstrable pulmonary embolus. No thoracic aortic aneurysm or dissection.  2. Underlying emphysematous change with areas of scarring. Postoperative changes bilaterally. No consolidation or parenchymal lung mass.  3.  No evident thoracic adenopathy.  4. Aortic atherosclerosis as well as foci of coronary artery calcification.  Aortic Atherosclerosis (ICD10-I70.0) and Emphysema (ICD10-J43.9).   Electronically Signed   By: Lowella Grip III M.D.   On: 06/04/2017 09:34  Impression:  He has now 1 year following his last lung cancer surgery with wedge resection of a right upper lobe lung cancer.  His recent chest CT scan showed no evidence of recurrent cancer and no new lung lesions.  I reviewed the CT scan images with him and answered his questions.  I strongly advised him to completely discontinue smoking.  He understands that if he continues smoking at all he will significantly increase his risk of developing further lung cancers as well as having progressive deterioration of his lung function.  Plan:  He will return to see me in 6 months with a CT scan of the  chest.  I spent 15 minutes performing this established patient evaluation and > 50% of this time was spent face to face counseling and coordinating the surveillance of this patient's lung cancer.    Gaye Pollack, MD [facility] [phone]

## 2017-08-12 ENCOUNTER — Other Ambulatory Visit: Payer: Self-pay | Admitting: Emergency Medicine

## 2017-08-14 ENCOUNTER — Other Ambulatory Visit: Payer: Self-pay | Admitting: *Deleted

## 2017-08-14 MED ORDER — ALBUTEROL SULFATE HFA 108 (90 BASE) MCG/ACT IN AERS: INHALATION_SPRAY | RESPIRATORY_TRACT | 5 refills | Status: DC

## 2017-08-20 ENCOUNTER — Emergency Department (HOSPITAL_COMMUNITY): Payer: Medicaid Other

## 2017-08-20 ENCOUNTER — Emergency Department (HOSPITAL_COMMUNITY)
Admission: EM | Admit: 2017-08-20 | Discharge: 2017-08-20 | Disposition: A | Discharge status: Home / Self Care | Payer: Medicaid Other | Attending: Emergency Medicine | Admitting: Emergency Medicine

## 2017-08-20 ENCOUNTER — Other Ambulatory Visit: Payer: Self-pay

## 2017-08-20 ENCOUNTER — Encounter (HOSPITAL_COMMUNITY): Payer: Self-pay | Admitting: Emergency Medicine

## 2017-08-20 DIAGNOSIS — I259 Chronic ischemic heart disease, unspecified: Secondary | ICD-10-CM | POA: Diagnosis not present

## 2017-08-20 DIAGNOSIS — J449 Chronic obstructive pulmonary disease, unspecified: Secondary | ICD-10-CM | POA: Diagnosis not present

## 2017-08-20 DIAGNOSIS — R109 Unspecified abdominal pain: Secondary | ICD-10-CM | POA: Diagnosis present

## 2017-08-20 DIAGNOSIS — Z7982 Long term (current) use of aspirin: Secondary | ICD-10-CM | POA: Diagnosis not present

## 2017-08-20 DIAGNOSIS — F17210 Nicotine dependence, cigarettes, uncomplicated: Secondary | ICD-10-CM | POA: Insufficient documentation

## 2017-08-20 DIAGNOSIS — Z79899 Other long term (current) drug therapy: Secondary | ICD-10-CM | POA: Diagnosis not present

## 2017-08-20 DIAGNOSIS — R1030 Lower abdominal pain, unspecified: Secondary | ICD-10-CM | POA: Diagnosis not present

## 2017-08-20 LAB — URINALYSIS, ROUTINE W REFLEX MICROSCOPIC
Bilirubin Urine: NEGATIVE
Glucose, UA: NEGATIVE mg/dL
Hgb urine dipstick: NEGATIVE
Ketones, ur: NEGATIVE mg/dL
Leukocytes, UA: NEGATIVE
Nitrite: NEGATIVE
Protein, ur: NEGATIVE mg/dL
Specific Gravity, Urine: 1.033 — ABNORMAL HIGH (ref 1.005–1.030)
pH: 5 (ref 5.0–8.0)

## 2017-08-20 LAB — CBC
HCT: 48.5 % (ref 39.0–52.0)
Hemoglobin: 15.6 g/dL (ref 13.0–17.0)
MCH: 31 pg (ref 26.0–34.0)
MCHC: 32.2 g/dL (ref 30.0–36.0)
MCV: 96.2 fL (ref 78.0–100.0)
Platelets: 299 10*3/uL (ref 150–400)
RBC: 5.04 MIL/uL (ref 4.22–5.81)
RDW: 14.1 % (ref 11.5–15.5)
WBC: 6.8 10*3/uL (ref 4.0–10.5)

## 2017-08-20 LAB — COMPREHENSIVE METABOLIC PANEL
ALT: 12 U/L (ref 0–44)
AST: 17 U/L (ref 15–41)
Albumin: 3.9 g/dL (ref 3.5–5.0)
Alkaline Phosphatase: 85 U/L (ref 38–126)
Anion gap: 6 (ref 5–15)
BUN: 11 mg/dL (ref 8–23)
CO2: 26 mmol/L (ref 22–32)
Calcium: 9.2 mg/dL (ref 8.9–10.3)
Chloride: 105 mmol/L (ref 98–111)
Creatinine, Ser: 1.23 mg/dL (ref 0.61–1.24)
GFR calc Af Amer: >60 mL/min (ref >60)
GFR calc non Af Amer: >60 mL/min (ref >60)
Glucose, Bld: 75 mg/dL (ref 70–99)
Potassium: 4.5 mmol/L (ref 3.5–5.1)
Sodium: 137 mmol/L (ref 135–145)
Total Bilirubin: 0.8 mg/dL (ref 0.3–1.2)
Total Protein: 6.5 g/dL (ref 6.5–8.1)

## 2017-08-20 LAB — LIPASE, BLOOD: Lipase: 36 U/L (ref 11–51)

## 2017-08-20 MED ORDER — ONDANSETRON HCL 4 MG/2ML IJ SOLN
4.0000 mg | Freq: Once | INTRAMUSCULAR | Status: AC
  Administered 2017-08-20: 4 mg via INTRAVENOUS
  Filled 2017-08-20: qty 2

## 2017-08-20 MED ORDER — HYDROCODONE-ACETAMINOPHEN 5-325 MG PO TABS: 1.0000 | ORAL_TABLET | Freq: Once | ORAL | Status: DC

## 2017-08-20 MED ORDER — DICYCLOMINE HCL 20 MG PO TABS: 20.0000 mg | ORAL_TABLET | Freq: Two times a day (BID) | ORAL | 0 refills | Status: DC

## 2017-08-20 MED ORDER — ONDANSETRON HCL 4 MG PO TABS: 4.0000 mg | ORAL_TABLET | Freq: Four times a day (QID) | ORAL | 0 refills | Status: DC

## 2017-08-20 MED ORDER — OXYCODONE-ACETAMINOPHEN 5-325 MG PO TABS
1.0000 | ORAL_TABLET | Freq: Once | ORAL | Status: AC
  Administered 2017-08-20: 1 via ORAL
  Filled 2017-08-20: qty 1

## 2017-08-20 MED ORDER — IOHEXOL 300 MG/ML  SOLN
100.0000 mL | Freq: Once | INTRAMUSCULAR | Status: AC | PRN
  Administered 2017-08-20: 100 mL via INTRAVENOUS

## 2017-08-20 MED ORDER — ONDANSETRON 4 MG PO TBDP
4.0000 mg | ORAL_TABLET | Freq: Once | ORAL | Status: DC
Start: 2017-08-20 — End: 2017-08-21

## 2017-08-20 MED ORDER — OXYCODONE-ACETAMINOPHEN 5-325 MG PO TABS: 1.0000 | ORAL_TABLET | Freq: Four times a day (QID) | ORAL | 0 refills | Status: DC | PRN

## 2017-08-20 MED ORDER — MORPHINE SULFATE (PF) 4 MG/ML IV SOLN
4.0000 mg | Freq: Once | INTRAVENOUS | Status: AC
  Administered 2017-08-20: 4 mg via INTRAVENOUS
  Filled 2017-08-20: qty 1

## 2017-08-20 NOTE — ED Triage Notes (Signed)
Patient complaining of lower abdominal pain that started four days ago. Also complains of nausea and vomiting. Patient alert, oriented, and in no apparent distress at this time.

## 2017-08-20 NOTE — Discharge Instructions (Addendum)
Take Bentyl twice daily as needed for abdominal pain and cramping.  Take Zofran every 6 hours as needed for nausea or vomiting.  For severe pain, take 1 Percocet every 6 hours as needed.  Start with bland foods such as bananas, rice, applesauce, toast and progress your diet as tolerated.  Please follow-up with your doctor if your symptoms are not improving by early next week.  Please return the emergency department if you develop any new or worsening symptoms such as intractable vomiting, bloody stools, severe, localizing abdominal pain, fever, or any other new or concerning symptom.  Do not drink alcohol, drive, operate machinery or participate in any other potentially dangerous activities while taking opiate pain medication as it may make you sleepy. Do not take this medication with any other sedating medications, either prescription or over-the-counter. If you were prescribed Percocet or Vicodin, do not take these with acetaminophen (Tylenol) as it is already contained within these medications and overdose of Tylenol is dangerous.   This medication is an opiate (or narcotic) pain medication and can be habit forming.  Use it as little as possible to achieve adequate pain control.  Do not use or use it with extreme caution if you have a history of opiate abuse or dependence. This medication is intended for your use only - do not give any to anyone else and keep it in a secure place where nobody else, especially children, have access to it. It will also cause or worsen constipation, so you may want to consider taking an over-the-counter stool softener while you are taking this medication.
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
## 2017-08-20 NOTE — ED Provider Notes (Signed)
Patient placed in Quick Look pathway, seen and evaluated   Chief Complaint: abdominal pain  HPI:   [name] is a 62 y.o. male who presents to the ED with abdominal pain, nausea and diarrhea that started 4 days ago and has gotten worse. Patient denies vomiting.   ROS: GI: nausea, diarrhea, abdominal pain  Physical Exam:  BP 102/80 (BP Location: Right Arm)   Pulse 71   Temp 98.4 F (36.9 C) (Oral)   Resp 16   SpO2 95%    Gen: No distress  Neuro: Awake and Alert  Skin: Warm and dry  Heart: regular rate and rhythm  Lungs: clear  Abdomen: soft, tender with palpation bilateral lower abdomen, left >right.       Initiation of care has begun. The patient has been counseled on the process, plan, and necessity for staying for the completion/evaluation, and the remainder of the medical screening examination    Ashley Murrain, NP 08/20/17 1501    Carmin Muskrat, MD 08/21/17 [phone]

## 2017-12-15 ENCOUNTER — Other Ambulatory Visit: Payer: Self-pay | Admitting: Surgery

## 2017-12-15 DIAGNOSIS — C3490 Malignant neoplasm of unspecified part of unspecified bronchus or lung: Secondary | ICD-10-CM

## 2017-12-22 ENCOUNTER — Ambulatory Visit (INDEPENDENT_AMBULATORY_CARE_PROVIDER_SITE_OTHER): Payer: Medicaid Other | Admitting: Cardiovascular Disease

## 2017-12-22 ENCOUNTER — Encounter: Payer: Self-pay | Admitting: Cardiovascular Disease

## 2017-12-22 DIAGNOSIS — R0789 Other chest pain: Secondary | ICD-10-CM

## 2017-12-22 DIAGNOSIS — Z720 Tobacco use: Secondary | ICD-10-CM

## 2017-12-22 NOTE — Patient Instructions (Signed)
Medication Instructions:  Your physician recommends that you continue on your current medications as directed. Please refer to the Current Medication list given to you today.  If you need a refill on your cardiac medications before your next appointment, please call your pharmacy.   Follow-Up: At [facility], you and your health needs are our priority.  As part of our continuing mission to provide you with exceptional heart care, we have created designated Provider Care Teams.  These Care Teams include your primary Cardiologist (physician) and Advanced Practice Providers (APPs -  Physician Assistants and Nurse Practitioners) who all work together to provide you with the care you need, when you need it. . As needed with Dr. Fletcher Anon
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
## 2018-01-01 ENCOUNTER — Other Ambulatory Visit: Payer: Self-pay | Admitting: Emergency Medicine

## 2018-01-06 ENCOUNTER — Ambulatory Visit: Payer: Medicaid Other | Admitting: Surgery

## 2018-01-06 ENCOUNTER — Other Ambulatory Visit: Payer: Self-pay

## 2018-01-06 ENCOUNTER — Ambulatory Visit
Admission: RE | Admit: 2018-01-06 | Discharge: 2018-01-06 | Disposition: A | Discharge status: Home / Self Care | Payer: Medicaid Other | Source: Ambulatory Visit | Attending: Surgery | Admitting: Surgery

## 2018-01-06 ENCOUNTER — Encounter: Payer: Self-pay | Admitting: Surgery

## 2018-01-06 VITALS — BP 104/76 | HR 72 | Resp 20 | Ht 69.0 in | Wt 167.0 lb

## 2018-01-06 DIAGNOSIS — C3412 Malignant neoplasm of upper lobe, left bronchus or lung: Secondary | ICD-10-CM

## 2018-01-06 DIAGNOSIS — C3411 Malignant neoplasm of upper lobe, right bronchus or lung: Secondary | ICD-10-CM | POA: Diagnosis not present

## 2018-01-06 DIAGNOSIS — C3490 Malignant neoplasm of unspecified part of unspecified bronchus or lung: Secondary | ICD-10-CM

## 2018-01-06 DIAGNOSIS — Z902 Acquired absence of lung [part of]: Secondary | ICD-10-CM | POA: Diagnosis not present

## 2018-01-06 NOTE — Progress Notes (Signed)
HPI:  Patient returns for routinesurveillancehaving undergoneRight VATS with wedge resection of a 1.4 cm RUL lung noduleon 07/03/2016. The final pathology showed a poorly differentiated adenocarcinoma with negative surgical margins.This is his second lung cancer resection having undergoneleft upper lobectomy on 06/12/2011 for a stage IB (T2A, N0, M0) non-small cell lung cancer consistent with adenocarcinoma. This was a 2.0 cm tumor with visceral pleural invasion.  He feels okay overall.  He has some exertional shortness of breath due to severe COPD and is followed by Dr. Lamonte Sakai.  This has been stable.  He continues to smoke but said he is down to about 4 cigarettes/day.  He said that he was taking Chantix but continued to smoke so he did not continue that medication.  He denies any sputum production or hemoptysis.  He has had no headaches or visual changes.  He has had no muscle or bone pain.  Current Outpatient Medications  Medication Sig Dispense Refill  . acetaminophen (TYLENOL) 500 MG tablet Take 500 mg by mouth every 6 (six) hours as needed for mild pain.    [name] albuterol (PROVENTIL HFA) 108 (90 Base) MCG/ACT inhaler INHALE 2 PUFFS BY MOUTH EVERY 6 HOURS AS NEEDED FOR WHEEZING OR SHORTNESS OF BREATH 1 Inhaler 5  . ANORO ELLIPTA 62.5-25 MCG/INH AEPB INHALE 1 PUFF BY MOUTH ONCE DAILY 60 each 2  . aspirin EC 81 MG tablet Take 81 mg by mouth at bedtime.    [name] azelastine (ASTELIN) 0.1 % nasal spray Place 2 sprays into both nostrils 2 (two) times daily as needed for rhinitis. 30 mL 5  . cyclobenzaprine (FLEXERIL) 5 MG tablet Take 5-10 mg by mouth 2 (two) times daily.  2  . dicyclomine (BENTYL) 20 MG tablet Take 1 tablet (20 mg total) by mouth 2 (two) times daily. 20 tablet 0  . fluticasone (FLONASE) 50 MCG/ACT nasal spray Place 2 sprays into both nostrils daily. 16 g 5  . gabapentin (NEURONTIN) 300 MG capsule Take 1 capsule (300 mg total) by mouth 3 (three) times daily. (Patient taking  differently: Take 300 mg by mouth daily as needed. ) 90 capsule 0  . methocarbamol (ROBAXIN) 500 MG tablet Take 1 tablet (500 mg total) by mouth every 6 (six) hours as needed for muscle spasms. 60 tablet 1  . nabumetone (RELAFEN) 750 MG tablet Take 750 mg by mouth 2 (two) times daily.  2  . omeprazole (PRILOSEC) 40 MG capsule Take 1 capsule (40 mg total) by mouth daily. 30 capsule 5  . ondansetron (ZOFRAN) 4 MG tablet Take 1 tablet (4 mg total) by mouth every 6 (six) hours. 12 tablet 0  . OXYGEN Inhale 2 L into the lungs at bedtime.     No current facility-administered medications for this visit.      Physical Exam: BP 104/76   Pulse 72   Resp 20   Ht 5\' 9"  (1.753 m)   Wt 167 lb (75.8 kg)   SpO2 95%   BMI 24.66 kg/m  He looks well. There is no cervical or supraclavicular adenopathy. Cardiac exam shows a regular rate and rhythm with normal heart sounds. Lung exam is clear. The chest scars from his prior resections look fine.   Diagnostic Tests:  CLINICAL DATA:  Follow-up cough, COPD, current smoker. History of lung cancer with LEFT upper lobectomy in 2013. RIGHT VATS with wedge resection 2018.  EXAM: CT CHEST WITHOUT CONTRAST  TECHNIQUE: Multidetector CT imaging of the chest was performed following the  standard protocol without IV contrast.  COMPARISON:  Chest CT dated 01/28/2017. CT pulmonary angiogram dated 06/04/2017.  FINDINGS: Cardiovascular: Heart size is normal. No pericardial effusion. Again noted are diffuse coronary artery calcifications. Again noted is aortic atherosclerosis. No thoracic aortic aneurysm.  Mediastinum/Nodes: No mass or enlarged lymph nodes seen within the mediastinum or perihilar regions. Esophagus appears normal. Trachea and central bronchi are unremarkable. Mild peripheral bronchiectasis bilaterally.  Lungs/Pleura: Emphysematous changes bilaterally. Surgical changes compatible with RIGHT lung wedge resection and LEFT upper  lobectomy, with compensatory hyperexpansion of the LEFT lower lobe. No pulmonary nodule or mass appreciated. No pneumonia or pulmonary edema. No pleural effusion or pneumothorax.  Upper Abdomen: Limited images of the upper abdomen are unremarkable.  Musculoskeletal: No acute or suspicious osseous finding.  IMPRESSION: 1. No acute findings. No evidence of recurrent or metastatic lung cancer. 2. Emphysematous changes bilaterally. 3. Coronary artery calcifications.  Aortic Atherosclerosis (ICD10-I70.0) and Emphysema (ICD10-J43.9).   Electronically Signed   By: Franki Cabot M.D.   On: 01/06/2018 13:44  Impression:  He is about 1-1/2 years out from his most recent lung cancer surgery.  CT scan of the chest shows no evidence of recurrent cancer or new lung lesions.  I reviewed the CT scan images with him and answered his questions.  I again strongly advised him to completely abstain from smoking.  Plan:  I will plan to see him back in 6 months with a CT scan of the chest for continued lung cancer surveillance.  I spent 15 minutes performing this established patient evaluation and > 50% of this time was spent face to face counseling and coordinating the surveillance of this patient's prior lung cancers.    Gaye Pollack, MD [facility] [phone]

## 2018-01-19 ENCOUNTER — Ambulatory Visit (INDEPENDENT_AMBULATORY_CARE_PROVIDER_SITE_OTHER): Payer: Medicaid Other | Admitting: Emergency Medicine

## 2018-01-19 ENCOUNTER — Encounter: Payer: Self-pay | Admitting: Emergency Medicine

## 2018-01-19 DIAGNOSIS — C3490 Malignant neoplasm of unspecified part of unspecified bronchus or lung: Secondary | ICD-10-CM

## 2018-01-19 DIAGNOSIS — J449 Chronic obstructive pulmonary disease, unspecified: Secondary | ICD-10-CM

## 2018-01-19 DIAGNOSIS — Z720 Tobacco use: Secondary | ICD-10-CM | POA: Diagnosis not present

## 2018-01-19 MED ORDER — OMEPRAZOLE 40 MG PO CPDR: 40.0000 mg | DELAYED_RELEASE_CAPSULE | Freq: Every day | ORAL | 5 refills | Status: DC

## 2018-01-19 MED ORDER — UMECLIDINIUM-VILANTEROL 62.5-25 MCG/INH IN AEPB: 1.0000 | INHALATION_SPRAY | Freq: Every day | RESPIRATORY_TRACT | 0 refills | Status: DC

## 2018-01-19 NOTE — Assessment & Plan Note (Addendum)
Following with Dr. Cyndia Bent.  Most recent CT scan of the chest 01/06/2018 was reassuring.

## 2018-01-19 NOTE — Assessment & Plan Note (Addendum)
Some progressive dyspnea.  Tolerating Anoro.  No exacerbations.  I think the most important thing to do right now is concentrate on smoking cessation.  Recommended the flu shot.

## 2018-01-19 NOTE — Patient Instructions (Addendum)
Please continue to work on decreasing your cigarettes.  Try to cut down to 7 cigarettes daily by our next visit. Your CT scan of the chest was reassuring without any evidence of return of your lung cancer.  Continue to follow with Dr. Cyndia Bent.  Please continue Anoro once daily. Keep albuterol available to use 2 puffs up to every 4 hours if needed for shortness of breath, chest tightness, wheezing.  We will convert your omeprazole pills back to the capsule formulation.  Continue 40 mg once daily.  Take this 1 hour before eating. I recommend that you get the flu shot.  Please let us know if you change your mind about getting this Follow with Dr Lamonte Sakai in 6 months or sooner if you have any problems
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
## 2018-01-19 NOTE — Assessment & Plan Note (Signed)
Discussed smoking cessation with him today.  Goals to cut down to 7 cigarettes daily

## 2018-03-02 ENCOUNTER — Emergency Department (HOSPITAL_COMMUNITY): Payer: Medicaid Other

## 2018-03-02 ENCOUNTER — Emergency Department (HOSPITAL_COMMUNITY)
Admission: EM | Admit: 2018-03-02 | Discharge: 2018-03-02 | Disposition: A | Discharge status: Home / Self Care | Payer: Medicaid Other | Attending: Emergency Medicine | Admitting: Emergency Medicine

## 2018-03-02 DIAGNOSIS — X58XXXA Exposure to other specified factors, initial encounter: Secondary | ICD-10-CM | POA: Insufficient documentation

## 2018-03-02 DIAGNOSIS — S46812A Strain of other muscles, fascia and tendons at shoulder and upper arm level, left arm, initial encounter: Secondary | ICD-10-CM

## 2018-03-02 DIAGNOSIS — Y939 Activity, unspecified: Secondary | ICD-10-CM | POA: Insufficient documentation

## 2018-03-02 DIAGNOSIS — J449 Chronic obstructive pulmonary disease, unspecified: Secondary | ICD-10-CM | POA: Insufficient documentation

## 2018-03-02 DIAGNOSIS — Z85118 Personal history of other malignant neoplasm of bronchus and lung: Secondary | ICD-10-CM | POA: Insufficient documentation

## 2018-03-02 DIAGNOSIS — R0602 Shortness of breath: Secondary | ICD-10-CM | POA: Insufficient documentation

## 2018-03-02 DIAGNOSIS — I2510 Atherosclerotic heart disease of native coronary artery without angina pectoris: Secondary | ICD-10-CM | POA: Diagnosis not present

## 2018-03-02 DIAGNOSIS — F17210 Nicotine dependence, cigarettes, uncomplicated: Secondary | ICD-10-CM | POA: Diagnosis not present

## 2018-03-02 DIAGNOSIS — Y929 Unspecified place or not applicable: Secondary | ICD-10-CM | POA: Insufficient documentation

## 2018-03-02 DIAGNOSIS — R079 Chest pain, unspecified: Secondary | ICD-10-CM | POA: Insufficient documentation

## 2018-03-02 DIAGNOSIS — Z7982 Long term (current) use of aspirin: Secondary | ICD-10-CM | POA: Diagnosis not present

## 2018-03-02 DIAGNOSIS — Y999 Unspecified external cause status: Secondary | ICD-10-CM | POA: Diagnosis not present

## 2018-03-02 DIAGNOSIS — Z79899 Other long term (current) drug therapy: Secondary | ICD-10-CM | POA: Insufficient documentation

## 2018-03-02 DIAGNOSIS — S4992XA Unspecified injury of left shoulder and upper arm, initial encounter: Secondary | ICD-10-CM | POA: Diagnosis present

## 2018-03-02 LAB — CBC
HCT: 45.3 % (ref 39.0–52.0)
Hemoglobin: 14.8 g/dL (ref 13.0–17.0)
MCH: 31.4 pg (ref 26.0–34.0)
MCHC: 32.7 g/dL (ref 30.0–36.0)
MCV: 96 fL (ref 80.0–100.0)
Platelets: 230 10*3/uL (ref 150–400)
RBC: 4.72 MIL/uL (ref 4.22–5.81)
RDW: 14.4 % (ref 11.5–15.5)
WBC: 6.1 10*3/uL (ref 4.0–10.5)
nRBC: 0 % (ref 0.0–0.2)

## 2018-03-02 LAB — BASIC METABOLIC PANEL
Anion gap: 8 (ref 5–15)
BUN: 10 mg/dL (ref 8–23)
CO2: 24 mmol/L (ref 22–32)
Calcium: 9.1 mg/dL (ref 8.9–10.3)
Chloride: 107 mmol/L (ref 98–111)
Creatinine, Ser: 1.04 mg/dL (ref 0.61–1.24)
GFR calc Af Amer: >60 mL/min (ref >60)
GFR calc non Af Amer: >60 mL/min (ref >60)
Glucose, Bld: 87 mg/dL (ref 70–99)
Potassium: 4.7 mmol/L (ref 3.5–5.1)
Sodium: 139 mmol/L (ref 135–145)

## 2018-03-02 LAB — I-STAT TROPONIN, ED: Troponin i, poc: 0 ng/mL (ref 0.00–0.08)

## 2018-03-02 MED ORDER — ACETAMINOPHEN 500 MG PO TABS
1000.0000 mg | ORAL_TABLET | Freq: Once | ORAL | Status: AC
  Administered 2018-03-02: 1000 mg via ORAL
  Filled 2018-03-02: qty 2

## 2018-03-02 MED ORDER — DICLOFENAC SODIUM 1 % TD GEL: 4.0000 g | Freq: Four times a day (QID) | TRANSDERMAL | 0 refills | Status: DC

## 2018-03-02 MED ORDER — KETOROLAC TROMETHAMINE 60 MG/2ML IM SOLN
15.0000 mg | Freq: Once | INTRAMUSCULAR | Status: AC
  Administered 2018-03-02: 15 mg via INTRAMUSCULAR
  Filled 2018-03-02: qty 2

## 2018-03-02 MED ORDER — SODIUM CHLORIDE 0.9% FLUSH: 3.0000 mL | Freq: Once | INTRAVENOUS | Status: DC

## 2018-03-02 MED ORDER — OXYCODONE HCL 5 MG PO TABS
5.0000 mg | ORAL_TABLET | Freq: Once | ORAL | Status: AC
  Administered 2018-03-02: 5 mg via ORAL
  Filled 2018-03-02: qty 1

## 2018-03-02 MED ORDER — DIAZEPAM 5 MG PO TABS
5.0000 mg | ORAL_TABLET | Freq: Once | ORAL | Status: AC
  Administered 2018-03-02: 5 mg via ORAL
  Filled 2018-03-02: qty 1

## 2018-03-02 NOTE — ED Notes (Signed)
Updated on wait for treatment room. 
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
## 2018-03-02 NOTE — ED Notes (Signed)
No answer - called for triage x 1.
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
## 2018-03-02 NOTE — ED Triage Notes (Signed)
Pt here from home with c/o left shoulder pain times four days , pt also c/o chest pain and sob

## 2018-03-02 NOTE — Discharge Instructions (Signed)
Take tylenol 1000mg (2 extra strength) four times a day.

## 2018-03-26 ENCOUNTER — Ambulatory Visit: Payer: Medicaid Other | Admitting: Physical Therapy

## 2018-04-04 ENCOUNTER — Other Ambulatory Visit: Payer: Self-pay | Admitting: Emergency Medicine

## 2018-04-09 ENCOUNTER — Ambulatory Visit: Payer: Medicaid Other | Admitting: Physical Therapy

## 2018-04-16 ENCOUNTER — Encounter: Payer: Self-pay | Admitting: Gastroenterology

## 2018-04-16 ENCOUNTER — Other Ambulatory Visit: Payer: Self-pay | Admitting: Family Medicine

## 2018-04-16 DIAGNOSIS — R1013 Epigastric pain: Secondary | ICD-10-CM

## 2018-04-19 ENCOUNTER — Other Ambulatory Visit: Payer: Self-pay | Admitting: Emergency Medicine

## 2018-04-19 ENCOUNTER — Other Ambulatory Visit: Payer: Self-pay | Admitting: Family Medicine

## 2018-04-19 DIAGNOSIS — R1013 Epigastric pain: Secondary | ICD-10-CM

## 2018-04-20 ENCOUNTER — Ambulatory Visit (INDEPENDENT_AMBULATORY_CARE_PROVIDER_SITE_OTHER): Payer: Medicaid Other | Admitting: Gastroenterology

## 2018-04-20 ENCOUNTER — Ambulatory Visit: Payer: Medicaid Other | Attending: Anesthesiology | Admitting: Physical Therapy

## 2018-04-20 ENCOUNTER — Other Ambulatory Visit: Payer: Medicaid Other

## 2018-04-20 ENCOUNTER — Other Ambulatory Visit: Payer: Self-pay

## 2018-04-20 ENCOUNTER — Encounter: Payer: Self-pay | Admitting: Physical Therapy

## 2018-04-20 ENCOUNTER — Encounter: Payer: Self-pay | Admitting: Gastroenterology

## 2018-04-20 VITALS — BP 80/68 | HR 61 | Temp 98.3°F | Ht 68.0 in | Wt 146.4 lb

## 2018-04-20 DIAGNOSIS — M62838 Other muscle spasm: Secondary | ICD-10-CM | POA: Diagnosis present

## 2018-04-20 DIAGNOSIS — M542 Cervicalgia: Secondary | ICD-10-CM | POA: Insufficient documentation

## 2018-04-20 DIAGNOSIS — R1010 Upper abdominal pain, unspecified: Secondary | ICD-10-CM

## 2018-04-20 DIAGNOSIS — R293 Abnormal posture: Secondary | ICD-10-CM | POA: Insufficient documentation

## 2018-04-20 DIAGNOSIS — K219 Gastro-esophageal reflux disease without esophagitis: Secondary | ICD-10-CM

## 2018-04-20 DIAGNOSIS — Z1211 Encounter for screening for malignant neoplasm of colon: Secondary | ICD-10-CM

## 2018-04-20 DIAGNOSIS — R109 Unspecified abdominal pain: Secondary | ICD-10-CM

## 2018-04-20 NOTE — Patient Instructions (Signed)
If you are age 63 or older, your body mass index should be between 23-30. Your Body mass index is 22.26 kg/m. If this is out of the aforementioned range listed, please consider follow up with your Primary Care Provider.  If you are age 61 or younger, your body mass index should be between 19-25. Your Body mass index is 22.26 kg/m. If this is out of the aformentioned range listed, please consider follow up with your Primary Care Provider.   You have been scheduled for an endoscopy and colonoscopy. Please follow the written instructions given to you at your visit today. Please pick up your prep supplies at the pharmacy within the next 1-3 days. If you use inhalers (even only as needed), please bring them with you on the day of your procedure. Your physician has requested that you go to www.startemmi.com and enter the access code given to you at your visit today. This web site gives a general overview about your procedure. However, you should still follow specific instructions given to you by our office regarding your preparation for the procedure.  Samples of this drug were given to the patient- Suprep    Thank you for choosing me and [facility].  Dr. Rush Landmark
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
## 2018-04-23 ENCOUNTER — Other Ambulatory Visit: Payer: Self-pay

## 2018-04-23 ENCOUNTER — Encounter: Payer: Self-pay | Admitting: Gastroenterology

## 2018-04-23 DIAGNOSIS — R1013 Epigastric pain: Secondary | ICD-10-CM | POA: Insufficient documentation

## 2018-04-23 DIAGNOSIS — K219 Gastro-esophageal reflux disease without esophagitis: Secondary | ICD-10-CM

## 2018-04-23 DIAGNOSIS — R1010 Upper abdominal pain, unspecified: Secondary | ICD-10-CM

## 2018-04-23 DIAGNOSIS — Z1211 Encounter for screening for malignant neoplasm of colon: Secondary | ICD-10-CM | POA: Insufficient documentation

## 2018-04-23 DIAGNOSIS — R109 Unspecified abdominal pain: Secondary | ICD-10-CM

## 2018-04-23 NOTE — Progress Notes (Signed)
Review of outside records  March 9 clinic visit Acute care visit per report for evaluation of shoulder/backslash chest pain.  Patient also complaining moderate to severe pain in upper abdomen which has been getting worse for months.  Laboratories were obtained and patient was referred for a colonoscopy as well as a GI consult as well as a CT abdomen pelvis. Labs as follows Hemoglobin 14.7 Hematocrit 43.9 MCV 93 Platelets 228 WBC 6.6 BUN 9 Creatinine 1.11 Sodium 140 Potassium 4.6 Calcium 9.5 Total protein 6.4 Albumin 4.1 Lipase 20 AST 15 ALT 11 Bilirubin total 0.5 Alkaline phosphatase 118 (upper limit of normal 117) Amylase 410 (upper limit of normal 2010)   October 23, 2017 clinic visit Patient being evaluated for chronic medical conditions and a follow-up of abdominal pain as well as lung cancer and tobacco use.  Reported H. pylori negative but did receive treatment and reportedly felt better but unclear what this meant A rheumatology consult was placed   August 24, 2017 clinic visit Patient being evaluated for sided lower abdominal pain. Patient was to get an H. pylori breath test even though he was on PPI therapy   Having read through these records will be scanned to chart it seems like last year the patient was tested for H. pylori while on a PPI via breath test and was reportedly negative but still got treated.  Not clear what the indication was at that point in time or if serologies were also positive we do not have those. Patient is on multiple medications that we do not have on her list I will have these notes scanned into the chart and then the patient's medications list should be updated as well.  I would like the patient to have a repeat hepatic function panel/amylase/lipase performed in the next 1 to 2 weeks.  With the COVID-19 situation we may have to postpone his diagnostic endoscopy as well as his colonoscopy but will see what the CT scan that was previously  ordered shows.  Justice Britain, MD [facility] Office # [phone]

## 2018-04-23 NOTE — Progress Notes (Signed)
Pt informed that he needs labs in 2 weeks. Orders for labs placed.

## 2018-04-26 ENCOUNTER — Ambulatory Visit
Admission: RE | Admit: 2018-04-26 | Discharge: 2018-04-26 | Disposition: A | Discharge status: Home / Self Care | Payer: Medicaid Other | Source: Ambulatory Visit | Attending: Family Medicine | Admitting: Family Medicine

## 2018-04-26 DIAGNOSIS — R1013 Epigastric pain: Secondary | ICD-10-CM

## 2018-04-26 MED ORDER — IOPAMIDOL (ISOVUE-300) INJECTION 61%
100.0000 mL | Freq: Once | INTRAVENOUS | Status: AC | PRN
  Administered 2018-04-26: 100 mL via INTRAVENOUS

## 2018-04-27 ENCOUNTER — Encounter: Payer: Medicaid Other | Admitting: Gastroenterology

## 2018-04-29 ENCOUNTER — Telehealth: Payer: Self-pay | Admitting: Gastroenterology

## 2018-04-29 NOTE — Telephone Encounter (Signed)
Left message for pt that he can come in next for labs.

## 2018-04-29 NOTE — Telephone Encounter (Signed)
Pt has to have lab work done today and would like to know if he has to come one to have it done or can he come in next week.

## 2018-05-03 ENCOUNTER — Other Ambulatory Visit (INDEPENDENT_AMBULATORY_CARE_PROVIDER_SITE_OTHER): Payer: Medicaid Other

## 2018-05-03 DIAGNOSIS — K219 Gastro-esophageal reflux disease without esophagitis: Secondary | ICD-10-CM

## 2018-05-03 DIAGNOSIS — R1010 Upper abdominal pain, unspecified: Secondary | ICD-10-CM

## 2018-05-03 DIAGNOSIS — R109 Unspecified abdominal pain: Secondary | ICD-10-CM | POA: Diagnosis not present

## 2018-05-03 LAB — HEPATIC FUNCTION PANEL
ALT: 9 U/L (ref 0–53)
AST: 13 U/L (ref 0–37)
Albumin: 4.1 g/dL (ref 3.5–5.2)
Alkaline Phosphatase: 99 U/L (ref 39–117)
Bilirubin, Direct: 0.1 mg/dL (ref 0.0–0.3)
Total Bilirubin: 0.5 mg/dL (ref 0.2–1.2)
Total Protein: 6.9 g/dL (ref 6.0–8.3)

## 2018-05-03 LAB — LIPASE: Lipase: 16 U/L (ref 11.0–59.0)

## 2018-05-03 LAB — AMYLASE: Amylase: 293 U/L — ABNORMAL HIGH (ref 27–131)

## 2018-05-04 ENCOUNTER — Ambulatory Visit: Payer: Medicaid Other | Admitting: Physical Therapy

## 2018-05-05 ENCOUNTER — Ambulatory Visit (HOSPITAL_COMMUNITY): Admit: 2018-05-05 | Payer: Medicaid Other | Admitting: Gastroenterology

## 2018-05-05 ENCOUNTER — Other Ambulatory Visit: Payer: Self-pay | Admitting: Surgery

## 2018-05-05 ENCOUNTER — Encounter (HOSPITAL_COMMUNITY): Payer: Self-pay

## 2018-05-05 DIAGNOSIS — C3490 Malignant neoplasm of unspecified part of unspecified bronchus or lung: Secondary | ICD-10-CM

## 2018-05-05 SURGERY — COLONOSCOPY WITH PROPOFOL
Anesthesia: Monitor Anesthesia Care

## 2018-05-06 ENCOUNTER — Telehealth: Payer: Self-pay | Admitting: Physical Therapy

## 2018-05-06 NOTE — Telephone Encounter (Signed)
[name] was contacted today regarding the temporary reduction of OP Rehab Services due to concerns for community transmission of Covid-19.    Therapist advised the patient to continue to perform their HEP and assured they had no unanswered questions at this time.  The patient was offered and declined the continuation in their POC by using methods such as an e-visit, virtual check in, or telehealth visit.    Outpatient Rehabilitation Services will follow up with this client when we are able to safely resume care at the [facility] in person.   Patient is aware we can be reached by telephone during limited business hours in the meantime.

## 2018-05-11 ENCOUNTER — Other Ambulatory Visit: Payer: Self-pay

## 2018-05-11 ENCOUNTER — Telehealth: Payer: Self-pay

## 2018-05-11 ENCOUNTER — Ambulatory Visit: Payer: Medicaid Other | Admitting: Physical Therapy

## 2018-05-11 DIAGNOSIS — Z1211 Encounter for screening for malignant neoplasm of colon: Secondary | ICD-10-CM

## 2018-05-11 DIAGNOSIS — R109 Unspecified abdominal pain: Secondary | ICD-10-CM

## 2018-05-11 DIAGNOSIS — R1010 Upper abdominal pain, unspecified: Secondary | ICD-10-CM

## 2018-05-11 DIAGNOSIS — K219 Gastro-esophageal reflux disease without esophagitis: Secondary | ICD-10-CM

## 2018-05-11 NOTE — Telephone Encounter (Signed)
-----   Message from Irving Copas., MD sent at 05/09/2018  8:22 PM EDT ----- Helmut Muster, This gentleman will need repeat amylase in 2-4 weeks. We should also confirm that we have him on the schedule for an EGD/Colon or in the process of rescheduling in 3-5 weeks. Thanks. GM

## 2018-05-11 NOTE — Telephone Encounter (Signed)
Pt informed that he needs repeat amylase in 2-4 weeks. Order has been placed for labs. Pt is on waitlist to be rescheduled at hospital.

## 2018-05-18 ENCOUNTER — Encounter: Payer: Medicaid Other | Admitting: Physical Therapy

## 2018-06-09 ENCOUNTER — Telehealth: Payer: Self-pay | Admitting: Physical Therapy

## 2018-06-09 NOTE — Telephone Encounter (Signed)
Contacted patient to offer an appointment in-clinic for Physical Therapy. Patient would like to wait until June to resume Physical Therapy. His contact information was forward to the operations staff for future scheduling.

## 2018-06-16 ENCOUNTER — Telehealth: Payer: Self-pay | Admitting: Emergency Medicine

## 2018-06-16 MED ORDER — UMECLIDINIUM-VILANTEROL 62.5-25 MCG/INH IN AEPB: INHALATION_SPRAY | RESPIRATORY_TRACT | 5 refills | Status: DC

## 2018-06-16 NOTE — Telephone Encounter (Signed)
Spoke with patient. He stated that [facility] stated that Anoro is not covered by his insurance. He has Medicaid. He stated that he has been stable on this medication for the past 2 years and does not want to change if possible. Advised him I would call [facility] to see exactly what is needed and take care of it. He verbalized understanding.   Spoke with pharmacist at [facility]. He stated that the Anoro did in fact need a PA.   Called [facility] to start the PA. PA has been approved until May 2020. [facility] and patient are aware.   Nothing further needed at time of call.

## 2018-06-16 NOTE — Telephone Encounter (Signed)
Refill of Anoro has been sent to pt's preferred pharmacy. Called and spoke with pt letting him know this had been done and pt verbalized understanding. Nothing further needed.

## 2018-07-06 ENCOUNTER — Ambulatory Visit: Payer: Medicaid Other | Attending: Anesthesiology | Admitting: Physical Therapy

## 2018-07-06 ENCOUNTER — Other Ambulatory Visit: Payer: Self-pay

## 2018-07-06 ENCOUNTER — Encounter: Payer: Self-pay | Admitting: Physical Therapy

## 2018-07-06 DIAGNOSIS — R293 Abnormal posture: Secondary | ICD-10-CM | POA: Insufficient documentation

## 2018-07-06 DIAGNOSIS — M542 Cervicalgia: Secondary | ICD-10-CM | POA: Diagnosis not present

## 2018-07-06 DIAGNOSIS — M62838 Other muscle spasm: Secondary | ICD-10-CM | POA: Diagnosis present
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
## 2018-07-08 ENCOUNTER — Telehealth: Payer: Self-pay

## 2018-07-08 ENCOUNTER — Other Ambulatory Visit: Payer: Self-pay

## 2018-07-08 DIAGNOSIS — Z1211 Encounter for screening for malignant neoplasm of colon: Secondary | ICD-10-CM

## 2018-07-08 DIAGNOSIS — R109 Unspecified abdominal pain: Secondary | ICD-10-CM

## 2018-07-08 MED ORDER — PEG 3350-KCL-NA BICARB-NACL 420 G PO SOLR: 4000.0000 mL | Freq: Once | ORAL | 0 refills | Status: AC

## 2018-07-08 NOTE — Telephone Encounter (Signed)
[facility] colon with Dr Jerilynn Mages on 6/29 at 915 am and covid testing on 6/26

## 2018-07-08 NOTE — Telephone Encounter (Signed)
COLON scheduled, pt instructed and medications reviewed.  Patient instructions mailed to home.  Patient to call with any questions or concerns. Pt verbalized understanding of COVID testing and quarantine.

## 2018-07-08 NOTE — Telephone Encounter (Signed)
[facility]

## 2018-07-13 ENCOUNTER — Other Ambulatory Visit: Payer: Self-pay

## 2018-07-14 ENCOUNTER — Other Ambulatory Visit: Payer: Medicaid Other

## 2018-07-14 ENCOUNTER — Ambulatory Visit: Payer: Medicaid Other | Admitting: Surgery

## 2018-07-26 ENCOUNTER — Telehealth: Payer: Self-pay | Admitting: Pulmonary Disease

## 2018-07-26 NOTE — Telephone Encounter (Signed)
Called and spoke with pt letting him know that Pam was calling to do COVID screen with him. I asked pt all the COVID screening questions and he answered negative to all the questions asked. I have updated pt's appt schedule so they can see that the covid screen was negative. Nothing further needed.

## 2018-07-27 ENCOUNTER — Encounter: Payer: Self-pay | Admitting: Pulmonary Disease

## 2018-07-27 ENCOUNTER — Ambulatory Visit (INDEPENDENT_AMBULATORY_CARE_PROVIDER_SITE_OTHER): Payer: Medicaid Other | Admitting: Pulmonary Disease

## 2018-07-27 ENCOUNTER — Other Ambulatory Visit: Payer: Self-pay

## 2018-07-27 VITALS — BP 110/86 | HR 67 | Temp 97.7°F | Ht 69.0 in | Wt 147.7 lb

## 2018-07-27 DIAGNOSIS — F17210 Nicotine dependence, cigarettes, uncomplicated: Secondary | ICD-10-CM | POA: Diagnosis not present

## 2018-07-27 DIAGNOSIS — J449 Chronic obstructive pulmonary disease, unspecified: Secondary | ICD-10-CM

## 2018-07-27 DIAGNOSIS — Z720 Tobacco use: Secondary | ICD-10-CM

## 2018-07-27 MED ORDER — ANORO ELLIPTA 62.5-25 MCG/INH IN AEPB: 1.0000 | INHALATION_SPRAY | Freq: Every day | RESPIRATORY_TRACT | 0 refills | Status: DC

## 2018-07-27 MED ORDER — NICOTINE POLACRILEX 4 MG MT LOZG: 4.0000 mg | LOZENGE | OROMUCOSAL | 3 refills | Status: DC | PRN

## 2018-07-27 MED ORDER — NICOTINE 21 MG/24HR TD PT24: 21.0000 mg | MEDICATED_PATCH | Freq: Every day | TRANSDERMAL | 3 refills | Status: DC

## 2018-07-27 NOTE — Assessment & Plan Note (Signed)
Assessment: December/2018 CT chest showing emphysema Status post lung resections January/2018 pulmonary function test with a DLCO of 23, FEV1 1.78) 59% predicted), COPD Gold 2 Maintained on Anoro Ellipta MMRC 2 Current every day smoker, 1 pack/day  Plan: Continue Anoro Ellipta inhaler You need to stop smoking Nicotine replacement therapies prescribed today Follow-up with our office in 2 months
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
## 2018-07-27 NOTE — Assessment & Plan Note (Signed)
Assessment: Current everyday smoker, 1 pack/day 56-pack-year smoking history Has used Chantix before which caused psychotic side effects Patient is willing to retry nicotine replacement therapies Patient has dentures, would prefer nicotine lozenges  Plan: 14 minutes of tobacco cessation counseling today Discussed nicotine replacement therapies patches as well as lozenges Prescription of 21 mg patch sent to pharmacy Prescription of 4 mg nicotine lozenge sent to pharmacy Patient also counseled on reduced to quit method in case nicotine replacement therapies are not covered on patient's insurance Follow-up in 2 months

## 2018-07-27 NOTE — Patient Instructions (Addendum)
Anoro Ellipta  >>> Take 1 puff daily in the morning right when you wake up >>>Rinse your mouth out after use >>>This is a daily maintenance inhaler, NOT a rescue inhaler >>>Contact our office if you are having difficulties affording or obtaining this medication >>>It is important for you to be able to take this daily and not miss any doses  Only use your albuterol as a rescue medication to be used if you can't catch your breath by resting or doing a relaxed purse lip breathing pattern.  - The less you use it, the better it will work when you need it. - Ok to use up to 2 puffs  every 4 hours if you must but call for immediate appointment if use goes up over your usual need - Don't leave home without it !!  (think of it like the spare tire for your car)      Note your daily symptoms > remember "red flags" for COPD:   >>>Increase in cough >>>increase in sputum production >>>increase in shortness of breath or activity  intolerance.   If you notice these symptoms, please call the office to be seen.     Nicotine patches: >>>Make sure you rotate sites that you do not get skin irritation, Apply 1 patch each morning to a non-hairy skin site  If you are smoking greater than 10 cigarettes/day and weigh over 45 kg start with the nicotine patch of 21 mg a day for 6 weeks, then 14 mg a day for 2 weeks, then finished with 7 mg a day for 2 weeks, then stop  If you are smoking less than 10 cigarettes a day or weight less than 45 kg start with medium dose pack of 14 mg a day for 6 weeks, followed by 7 mg a day for 2 weeks   >>>If insomnia occurs you are having trouble sleeping you can take the patch off at night, and place a new one on in the morning >>>If the patch is removed at night and you have morning cravings start short acting nicotine replacement therapy such as gum or lozenges  >>>Avoid acidic beverages such as coffee, carbonated beverages before and during gum / lozenge use.  A soft acidic  beverages lower oral pH which cause nicotine to not be absorbed properly >>>If you chew the gum too quickly or vigorously you could have nausea, vomiting, abdominal pain, constipation, hiccups, headache, sore jaw, mouth irritation ulcers  Nicotine lozenge: Lozenges are commonly uses short acting NRT product  >>>Smokers who smoke within 30 minutes of awakening should use 4 mg dose  Can use up to 1 lozenge every 1-2 hours for 6 weeks >>>Total amount of lozenges that can be used per day as 20 >>>Gradually reduce number of lozenges used per day after 2 weeks of use  Place lozenge in mouth and allowed to dissolve for 30 minutes loss and does not need to be chewed  Lozenges have advantages to be able to be used in people with TMG, poor dentition, dentures   We recommend that you stop smoking.  >>>You need to set a quit date >>>If you have friends or family who smoke, let them know you are trying to quit and not to smoke around you or in your living environment  Smoking Cessation Resources:  1 800 QUIT NOW  >>> Patient to call this resource and utilize it to help support her quit smoking >>> Keep up your hard work with stopping smoking  You can  also contact the [facility] >>>For smoking cessation classes call [phone]  We do not recommend using e-cigarettes as a form of stopping smoking  You can sign up for smoking cessation support texts and information:  >>>https://smokefree.gov/smokefreetxt    No follow-ups on file.    Coronavirus (COVID-19) Are you at risk?  Are you at risk for the Coronavirus (COVID-19)?  To be considered HIGH RISK for Coronavirus (COVID-19), you have to meet the following criteria:  . Traveled to [place], [place], [place], [place] or [place]; or in the [place] to [place], [place], [place], or [place]; and have fever, cough, and shortness of breath within the last 2 weeks of travel OR . Been in close contact with a person  diagnosed with COVID-19 within the last 2 weeks and have fever, cough, and shortness of breath . IF YOU DO NOT MEET THESE CRITERIA, YOU ARE CONSIDERED LOW RISK FOR COVID-19.  What to do if you are HIGH RISK for COVID-19?  [name] If you are having a medical emergency, call 911. . Seek medical care right away. Before you go to a doctor's office, urgent care or emergency department, call ahead and tell them about your recent travel, contact with someone diagnosed with COVID-19, and your symptoms. You should receive instructions from your physician's office regarding next steps of care.  . When you arrive at healthcare provider, tell the healthcare staff immediately you have returned from visiting [place], [place], [place], [place] or [place]; or traveled in the [place] to [place], [place], [place], or [place]; in the last two weeks or you have been in close contact with a person diagnosed with COVID-19 in the last 2 weeks.   . Tell the health care staff about your symptoms: fever, cough and shortness of breath. . After you have been seen by a medical provider, you will be either: o Tested for (COVID-19) and discharged home on quarantine except to seek medical care if symptoms worsen, and asked to  - Stay home and avoid contact with others until you get your results (4-5 days)  - Avoid travel on public transportation if possible (such as bus, train, or airplane) or o Sent to the Emergency Department by EMS for evaluation, COVID-19 testing, and possible admission depending on your condition and test results.  What to do if you are LOW RISK for COVID-19?  Reduce your risk of any infection by using the same precautions used for avoiding the common cold or flu:  [name] Wash your hands often with soap and warm water for at least 20 seconds.  If soap and water are not readily available, use an alcohol-based hand sanitizer with at least 60% alcohol.  . If coughing or sneezing, cover your mouth and nose by  coughing or sneezing into the elbow areas of your shirt or coat, into a tissue or into your sleeve (not your hands). . Avoid shaking hands with others and consider head nods or verbal greetings only. . Avoid touching your eyes, nose, or mouth with unwashed hands.  . Avoid close contact with people who are sick. . Avoid places or events with large numbers of people in one location, like concerts or sporting events. . Carefully consider travel plans you have or are making. . If you are planning any travel outside or inside the [place], visit the CDC's Travelers' Health webpage for the latest health notices. . If you have some symptoms but not all symptoms, continue to  monitor at home and seek medical attention if your symptoms worsen. . If you are having a medical emergency, call 911.   [place] / e-Visit: [email or web address]         [facility]: [facility]: [phone]                   [facility]: [phone]           It is flu season:   >>> Best ways to protect herself from the flu: Receive the yearly flu vaccine, practice good hand hygiene washing with soap and also using hand sanitizer when available, eat a nutritious meals, get adequate rest, hydrate appropriately   Please contact the office if your symptoms worsen or you have concerns that you are not improving.   Thank you for choosing [facility] for your healthcare, and for allowing [place] to partner with you on your healthcare journey. I am thankful to be able to provide care to you today.   Wyn Quaker FNP-C

## 2018-07-29 ENCOUNTER — Other Ambulatory Visit: Payer: Self-pay | Admitting: Emergency Medicine

## 2018-07-29 ENCOUNTER — Other Ambulatory Visit (HOSPITAL_COMMUNITY): Payer: Medicaid Other

## 2018-07-29 NOTE — Progress Notes (Signed)
[name] is unable to come in for his Covid 74 screen today due to transportation issues. He is scheduled to come in tomorrow for Covid 19 screen for procedure on 6/29.

## 2018-07-30 ENCOUNTER — Encounter (HOSPITAL_COMMUNITY): Payer: Self-pay | Admitting: *Deleted

## 2018-07-30 ENCOUNTER — Other Ambulatory Visit: Payer: Self-pay

## 2018-07-30 ENCOUNTER — Other Ambulatory Visit (HOSPITAL_COMMUNITY): Payer: Medicaid Other

## 2018-07-30 ENCOUNTER — Other Ambulatory Visit (HOSPITAL_COMMUNITY)
Admission: RE | Admit: 2018-07-30 | Discharge: 2018-07-30 | Disposition: A | Discharge status: Home / Self Care | Payer: Medicaid Other | Source: Ambulatory Visit | Attending: Gastroenterology | Admitting: Gastroenterology

## 2018-07-30 ENCOUNTER — Telehealth: Payer: Self-pay | Admitting: Pulmonary Disease

## 2018-07-30 DIAGNOSIS — Z1159 Encounter for screening for other viral diseases: Secondary | ICD-10-CM | POA: Insufficient documentation

## 2018-07-30 LAB — SARS CORONAVIRUS 2 (TAT 6-24 HRS): SARS Coronavirus 2: NEGATIVE

## 2018-07-30 NOTE — Telephone Encounter (Signed)
Pt is calling back [phone]

## 2018-07-30 NOTE — Telephone Encounter (Signed)
Called patient but he did not answer. Left a message for him to call back.   Checked the Medicaid preferred list, the following are covered: bupropion, Chantix starter or continuation box and Nicorelief Gum.

## 2018-07-30 NOTE — Telephone Encounter (Signed)
Sorry to hear that.  Please talk with the patient and explained that I believe then his treatment plan should be:  Starting 21 mg nicotine patches as discussed at last office visit >>> I understand that they were an immediate fix in the past but he has admitted that this has helped him with reducing his smoking in the past, I believe that is why we should utilize them now especially if insurance is willing to pay for them Work to do "reduce to quit method" each week reducing by 1 to 2 cigarettes >>> For instance if he smoking 20 cigarettes daily this week the next week he reduce down to 18 cigarettes daily, and so forth >>>He will have to ration his cigarettes to ensure that he sticks this plan >>>This should also allow him to have a set pattern to look at over the next 10 to 20 weeks to get to a quit date He needs to set a quit date As he starts to save money by reducing his smoking he should a lot some of that to 4 mg lozenges for him to purchase over-the-counter  He can also work to communicate with friends and family to see if anyone else is willing to purchase the over-the-counter 4 mg lozenges for him as he works to reduce his smoking.  Also remind him he can utilize these resources as well:  We recommend that you stop smoking.  >>>You need to set a quit date >>>If you have friends or family who smoke, let them know you are trying to quit and not to smoke around you or in your living environment  Smoking Cessation Resources:  1 800 QUIT NOW  >>> Patient to call this resource and utilize it to help support her quit smoking >>> Keep up your hard work with stopping smoking  You can also contact the [facility] >>>For smoking cessation classes call [phone]  We do not recommend using e-cigarettes as a form of stopping smoking  You can sign up for smoking cessation support texts and information:  >>>https://smokefree.gov/smokefreetxt  Wyn Quaker, FNP

## 2018-07-30 NOTE — Telephone Encounter (Signed)
Spoke with patient. He stated that his insurance will not cover the lozenges but they will pay for the patches. Advised patient of the alternatives that are covered. He stated that he has tried the patches before. They worked for a few months but he started smoking again. He can not use the gum because it sticks to his dentures. He tried Chantix before but he had to stop due to having disturbing mental thoughts and ideas. He does not want to try Chantix again.   Aaron Edelman, please advise. Thanks!

## 2018-07-30 NOTE — Progress Notes (Signed)
Pt denies any acute pulmonary issues. Pt stated that he is under the care of Dr. Lamonte Sakai, Pulmonology and Dr. Lavonia Drafts, PCP. Pt denies having chest pain and being under the care of a cardiologist. Pt made aware to stop taking vitamins, fish oil and herbal medications. Do not take any NSAIDs ie: Ibuprofen, Advil, Naproxen (Aleve), Motrin, BC and Goody Powder.  Pt denies that he and family members tested positive for COVID-19 ( pt test result still pending; pt reminded to quarantine). Pt denies that he and family members experienced the following symptoms:  Cough yes/no: No Fever (>100.59F)  yes/no: No Runny nose yes/no: No Sore throat yes/no: No Difficulty breathing/shortness of breath  yes/no: No  Have you or a family member traveled in the last 14 days and where? yes/no: No  Pt reminded  that hospital visitation restrictions are in effect and the importance of the restrictions.   Pt verbalized understanding of all pre-op instructions.

## 2018-07-30 NOTE — Telephone Encounter (Signed)
Called and spoke with pt stating to him the information from [place]. Pt verbalized understanding. Nothing further needed.

## 2018-08-01 ENCOUNTER — Telehealth: Payer: Self-pay | Admitting: Nurse Practitioner

## 2018-08-01 NOTE — Telephone Encounter (Signed)
Patient called concerned he ate pinto beans and corn yesterday. No solid foods today. I advised patient to push fluids, remain on clear liquid diet today to follow his colonoscopy bowel prep instructions. I reviewed clear liquids ie: apple juice, water, tea, jello, broth, clear sodas etc. I advised that he call answering service if he is not responding to bowel prep, may require additional prep if bowels not cleaned out. Patient to proceed with colonoscopy 6/29 as scheduled.

## 2018-08-02 ENCOUNTER — Encounter (HOSPITAL_COMMUNITY): Payer: Self-pay | Admitting: *Deleted

## 2018-08-02 ENCOUNTER — Ambulatory Visit (HOSPITAL_COMMUNITY)
Admission: RE | Admit: 2018-08-02 | Discharge: 2018-08-02 | Disposition: A | Discharge status: Home / Self Care | Payer: Medicaid Other | Attending: Gastroenterology | Admitting: Gastroenterology

## 2018-08-02 ENCOUNTER — Encounter (HOSPITAL_COMMUNITY)
Admission: RE | Disposition: A | Discharge status: Home / Self Care | Payer: Self-pay | Source: Home / Self Care | Attending: Gastroenterology

## 2018-08-02 ENCOUNTER — Ambulatory Visit (HOSPITAL_COMMUNITY): Payer: Medicaid Other | Admitting: Certified Registered Nurse Anesthetist

## 2018-08-02 DIAGNOSIS — Z85118 Personal history of other malignant neoplasm of bronchus and lung: Secondary | ICD-10-CM | POA: Insufficient documentation

## 2018-08-02 DIAGNOSIS — K635 Polyp of colon: Secondary | ICD-10-CM | POA: Diagnosis not present

## 2018-08-02 DIAGNOSIS — K449 Diaphragmatic hernia without obstruction or gangrene: Secondary | ICD-10-CM | POA: Diagnosis not present

## 2018-08-02 DIAGNOSIS — K226 Gastro-esophageal laceration-hemorrhage syndrome: Secondary | ICD-10-CM | POA: Insufficient documentation

## 2018-08-02 DIAGNOSIS — F329 Major depressive disorder, single episode, unspecified: Secondary | ICD-10-CM | POA: Diagnosis not present

## 2018-08-02 DIAGNOSIS — K210 Gastro-esophageal reflux disease with esophagitis: Secondary | ICD-10-CM | POA: Diagnosis not present

## 2018-08-02 DIAGNOSIS — I2510 Atherosclerotic heart disease of native coronary artery without angina pectoris: Secondary | ICD-10-CM | POA: Diagnosis not present

## 2018-08-02 DIAGNOSIS — K2950 Unspecified chronic gastritis without bleeding: Secondary | ICD-10-CM | POA: Insufficient documentation

## 2018-08-02 DIAGNOSIS — J439 Emphysema, unspecified: Secondary | ICD-10-CM | POA: Diagnosis not present

## 2018-08-02 DIAGNOSIS — Z9981 Dependence on supplemental oxygen: Secondary | ICD-10-CM | POA: Diagnosis not present

## 2018-08-02 DIAGNOSIS — R109 Unspecified abdominal pain: Secondary | ICD-10-CM

## 2018-08-02 DIAGNOSIS — E785 Hyperlipidemia, unspecified: Secondary | ICD-10-CM | POA: Diagnosis not present

## 2018-08-02 DIAGNOSIS — G4700 Insomnia, unspecified: Secondary | ICD-10-CM | POA: Insufficient documentation

## 2018-08-02 DIAGNOSIS — Z1211 Encounter for screening for malignant neoplasm of colon: Secondary | ICD-10-CM | POA: Diagnosis not present

## 2018-08-02 DIAGNOSIS — K529 Noninfective gastroenteritis and colitis, unspecified: Secondary | ICD-10-CM | POA: Insufficient documentation

## 2018-08-02 DIAGNOSIS — F17210 Nicotine dependence, cigarettes, uncomplicated: Secondary | ICD-10-CM | POA: Insufficient documentation

## 2018-08-02 DIAGNOSIS — M479 Spondylosis, unspecified: Secondary | ICD-10-CM | POA: Insufficient documentation

## 2018-08-02 DIAGNOSIS — K621 Rectal polyp: Secondary | ICD-10-CM | POA: Insufficient documentation

## 2018-08-02 DIAGNOSIS — Z79899 Other long term (current) drug therapy: Secondary | ICD-10-CM | POA: Diagnosis not present

## 2018-08-02 DIAGNOSIS — K6389 Other specified diseases of intestine: Secondary | ICD-10-CM | POA: Insufficient documentation

## 2018-08-02 DIAGNOSIS — K641 Second degree hemorrhoids: Secondary | ICD-10-CM | POA: Diagnosis not present

## 2018-08-02 DIAGNOSIS — I252 Old myocardial infarction: Secondary | ICD-10-CM | POA: Insufficient documentation

## 2018-08-02 DIAGNOSIS — K209 Esophagitis, unspecified: Secondary | ICD-10-CM

## 2018-08-02 DIAGNOSIS — R1010 Upper abdominal pain, unspecified: Secondary | ICD-10-CM | POA: Diagnosis present

## 2018-08-02 HISTORY — DX: Unspecified abdominal pain: R10.9

## 2018-08-02 HISTORY — PX: COLONOSCOPY WITH PROPOFOL: SHX5780

## 2018-08-02 HISTORY — PX: ESOPHAGOGASTRODUODENOSCOPY (EGD) WITH PROPOFOL: SHX5813

## 2018-08-02 HISTORY — DX: Presence of spectacles and contact lenses: Z97.3

## 2018-08-02 HISTORY — PX: POLYPECTOMY: SHX5525

## 2018-08-02 HISTORY — PX: HEMOSTASIS CLIP PLACEMENT: SHX6857

## 2018-08-02 HISTORY — PX: BIOPSY: SHX5522

## 2018-08-02 SURGERY — COLONOSCOPY WITH PROPOFOL
Anesthesia: Monitor Anesthesia Care

## 2018-08-02 MED ORDER — LACTATED RINGERS IV SOLN
INTRAVENOUS | Status: DC
  Administered 2018-08-02: 08:00:00 via INTRAVENOUS

## 2018-08-02 MED ORDER — PROPOFOL 10 MG/ML IV BOLUS
INTRAVENOUS | Status: DC | PRN
  Administered 2018-08-02: 20 mg via INTRAVENOUS
  Administered 2018-08-02 (×2): 30 mg via INTRAVENOUS
  Administered 2018-08-02: 40 mg via INTRAVENOUS

## 2018-08-02 MED ORDER — SODIUM CHLORIDE 0.9 % IV SOLN: INTRAVENOUS | Status: DC

## 2018-08-02 MED ORDER — LIDOCAINE 2% (20 MG/ML) 5 ML SYRINGE
INTRAMUSCULAR | Status: DC | PRN
  Administered 2018-08-02: 100 mg via INTRAVENOUS

## 2018-08-02 MED ORDER — PHENYLEPHRINE 40 MCG/ML (10ML) SYRINGE FOR IV PUSH (FOR BLOOD PRESSURE SUPPORT)
PREFILLED_SYRINGE | INTRAVENOUS | Status: DC | PRN
  Administered 2018-08-02: 120 ug via INTRAVENOUS

## 2018-08-02 MED ORDER — PROPOFOL 500 MG/50ML IV EMUL
INTRAVENOUS | Status: DC | PRN
  Administered 2018-08-02: 100 ug/kg/min via INTRAVENOUS

## 2018-08-02 MED ORDER — OMEPRAZOLE 40 MG PO CPDR: 40.0000 mg | DELAYED_RELEASE_CAPSULE | Freq: Two times a day (BID) | ORAL | 5 refills | Status: DC

## 2018-08-02 SURGICAL SUPPLY — 24 items

## 2018-08-02 NOTE — Anesthesia Preprocedure Evaluation (Addendum)
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),  COPD inhaler and oxygen dependent, Current Smoker,    Pulmonary exam normal breath sounds clear to auscultation       Cardiovascular + CAD and + Past MI  Normal cardiovascular exam Rhythm:Regular Rate:Normal  ECG: SB, rate 56   Neuro/Psych  Headaches, PSYCHIATRIC DISORDERS Depression    GI/Hepatic GERD  Medicated and Controlled,(+)     substance abuse  ,   Endo/Other  negative endocrine ROS  Renal/GU negative Renal ROS     Musculoskeletal Chronic lower back pain   Abdominal   Peds  Hematology HLD   Anesthesia Other Findings chronic abd pain  colon screening  Reproductive/Obstetrics                            Anesthesia Physical Anesthesia Plan  ASA: IV  Anesthesia Plan: MAC   Post-op Pain Management:    Induction: Intravenous  PONV Risk Score and Plan: 0 and Propofol infusion and Treatment may vary due to age or medical condition  Airway Management Planned: Nasal Cannula  Additional Equipment:   Intra-op Plan:   Post-operative Plan:   Informed Consent: I have reviewed the patients History and Physical, chart, labs and discussed the procedure including the risks, benefits and alternatives for the proposed anesthesia with the patient or authorized representative who has indicated his/her understanding and acceptance.       Plan Discussed with: CRNA  Anesthesia Plan Comments:        Anesthesia Quick Evaluation

## 2018-08-02 NOTE — Op Note (Signed)
[facility] Patient Name: [name] Procedure Date : 08/02/2018 MRN: [id number] Attending MD: Justice Britain , MD Date of Birth: 12-03-1955 CSN: [id number] Age: 63 Admit Type: Outpatient Procedure:                Upper GI endoscopy Indications:              Upper abdominal pain, Suspected gastro-esophageal                            reflux disease Providers:                Justice Britain, MD, Jeanella Cara, RN,                            Bentzion Dalton, Technician Referring MD:             Fredric Dine. Lott Medicines:                Monitored Anesthesia Care Complications:            No immediate complications. Estimated Blood Loss:     Estimated blood loss was minimal. Procedure:                Pre-Anesthesia Assessment:                           - Prior to the procedure, a History and Physical                            was performed, and patient medications and                            allergies were reviewed. The patient's tolerance of                            previous anesthesia was also reviewed. The risks                            and benefits of the procedure and the sedation                            options and risks were discussed with the patient.                            All questions were answered, and informed consent                            was obtained. Prior Anticoagulants: The patient has                            taken no previous anticoagulant or antiplatelet                            agents. ASA Grade Assessment: II - A patient with  mild systemic disease. After reviewing the risks                            and benefits, the patient was deemed in                            satisfactory condition to undergo the procedure.                           After obtaining informed consent, the endoscope was                            passed under direct vision. Throughout the    procedure, the patient's blood pressure, pulse, and                            oxygen saturations were monitored continuously. The                            [id number]-H190 ([id number]) Olympus gastroscope was                            introduced through the mouth, and advanced to the                            second part of duodenum. The upper GI endoscopy was                            accomplished without difficulty. The patient                            tolerated the procedure. Scope In: Scope Out: Findings:      No gross lesions were noted in the proximal esophagus and in the mid       esophagus. Biopsies were taken with a cold forceps for histology.      LA Grade B (one or more mucosal breaks greater than 5 mm, not extending       between the tops of two mucosal folds) esophagitis with no bleeding was       found in the distal esophagus.      A small hiatal hernia was present.      Within the hiatal hernia, there were 2 non-bleeding Mallory-Weiss tears       with one having stigmata of recent bleeding (hematin/clot) on it. This       was lavaged off. To close the tear, the tissue edges were approximated       and one hemostatic clip was successfully placed (MR conditional). There       was no bleeding during, or at the end, of the procedure from the       intervention.      Multiple dispersed, small non-bleeding erosions were found in the       gastric body, at the incisura and in the gastric antrum. There were       stigmata of recent bleeding with findings of hematin on most of these       erosions.  No other gross lesions were noted in the entire examined stomach.       Biopsies were taken with a cold forceps for histology and Helicobacter       pylori testing.      No gross lesions were noted in the duodenal bulb, in the first portion       of the duodenum and in the second portion of the duodenum. Biopsies were       taken with a cold forceps for histology to rule out  enteropathy/celiac. Impression:               - No gross lesions in proximal/middle esophagus.                            Biopsied.                           - LA Grade B distal esophagitis.                           - Small hiatal hernia.                           - Mallory-Weiss tears noted. One was clipped.                           - Recently bleeding erosive gastropathy. Biopsied                            for HP evaluation.                           - No gross lesions in the duodenal bulb, in the                            first portion of the duodenum and in the second                            portion of the duodenum. Biopsied for enteropathy                            rule out. Recommendation:           - Proceed to scheduled colonoscopy.                           - Increase PPI to 40 mg BID.                           - Observe patient's clinical course.                           - Await pathology results.                           - Repeat upper endoscopy in 3 months to check  healing of esophagus.                           - The findings and recommendations were discussed                            with the patient. Procedure Code(s):        --- Professional ---                           [phone], Esophagogastroduodenoscopy, flexible,                            transoral; with biopsy, single or multiple Diagnosis Code(s):        --- Professional ---                           K20.9, Esophagitis, unspecified                           K44.9, Diaphragmatic hernia without obstruction or                            gangrene                           K22.6, Gastro-esophageal laceration-hemorrhage                            syndrome                           K31.89, Other diseases of stomach and duodenum                           K92.2, Gastrointestinal hemorrhage, unspecified                           R10.10, Upper abdominal pain, unspecified CPT copyright  2019 American Medical Association. All rights reserved. The codes documented in this report are preliminary and upon coder review may  be revised to meet current compliance requirements. Justice Britain, MD 08/02/2018 11:33:07 AM Number of Addenda: 0

## 2018-08-02 NOTE — Op Note (Signed)
[facility] Patient Name: [name] Procedure Date : 08/02/2018 MRN: [id number] Attending MD: Justice Britain , MD Date of Birth: [birth year] CSN: [id number] Age: 63 Admit Type: Outpatient Procedure:                Colonoscopy Indications:              Screening for malignant neoplasm in the colon Providers:                Justice Britain, MD, Jeanella Cara, RN,                            Clide Dalton, Technician Referring MD:              Medicines:                Monitored Anesthesia Care Complications:            No immediate complications. Estimated Blood Loss:     Estimated blood loss was minimal. Procedure:                Pre-Anesthesia Assessment:                           - Prior to the procedure, a History and Physical                            was performed, and patient medications and                            allergies were reviewed. The patient's tolerance of                            previous anesthesia was also reviewed. The risks                            and benefits of the procedure and the sedation                            options and risks were discussed with the patient.                            All questions were answered, and informed consent                            was obtained. Prior Anticoagulants: The patient has                            taken no previous anticoagulant or antiplatelet                            agents. ASA Grade Assessment: II - A patient with                            mild systemic disease. After reviewing the risks  and benefits, the patient was deemed in                            satisfactory condition to undergo the procedure.                           After obtaining informed consent, the colonoscope                            was passed under direct vision. Throughout the                            procedure, the patient's blood pressure, pulse, and                  oxygen saturations were monitored continuously. The                            PCF-H190DL ([id number] Olympus pediatric colonscope                            was introduced through the anus and advanced to the                            10 cm into the ileum. The colonoscopy was performed                            without difficulty. The patient tolerated the                            procedure. The quality of the bowel preparation was                            adequate. The terminal ileum, ileocecal valve,                            appendiceal orifice, and rectum were photographed. Findings:      Hemorrhoids were found on perianal exam.      The terminal ileum appeared normal. Biopsies were taken with a cold       forceps for histology to rule out ileitis      An area of mucosa on the ileocecal valve was granular, some possible       polypoid appearance. Biopsies were taken with a cold forceps for       histology to rule out adenoma.      Three sessile polyps were found in the transverse colon (1) and       ascending colon (2). The polyps were 2 to 4 mm in size. These polyps       were removed with a cold snare. Resection and retrieval were complete.      Patchy moderate inflammation characterized by erythema, linear erosions,       pseudopolyps and aphthous ulcerations was found in the transverse colon,       at the hepatic flexure, in the ascending colon and in the cecum.       Biopsies were taken with a cold forceps for  histology to rule out       chronic colitis.      Patchy mild inflammation characterized by erosions, friability,       granularity and linear erosions was found in the sigmoid colon and in       the descending colon. Biopsies were taken with a cold forceps for       histology to rule out chronic colitis.      The rectum appeared normal. Biopsies were taken with a cold forceps for       histology to rule out proctitis.      Non-bleeding non-thrombosed  internal hemorrhoids were found during       retroflexion, during perianal exam and during digital exam. The       hemorrhoids were Grade II (internal hemorrhoids that prolapse but reduce       spontaneously). Impression:               - Hemorrhoids found on perianal exam.                           - The examined portion of the ileum was normal.                            Biopsied.                           - Granularity at the ileocecal valve. Biopsied.                           - Three 2 to 4 mm polyps in the transverse colon                            and in the ascending colon, removed with a cold                            snare. Resected and retrieved.                           - Patchy moderate inflammation was found in the                            transverse colon, at the hepatic flexure, in the                            ascending colon and in the cecum. Biopsied.                           - Patchy mild inflammation was found in the sigmoid                            colon and in the descending colon. Biopsied.                           - The rectum is normal. Biopsied to rule out  proctitis.                           - Non-bleeding non-thrombosed internal hemorrhoids. Recommendation:           - The patient will be observed post-procedure,                            until all discharge criteria are met.                           - Discharge patient to home.                           - Patient has a contact number available for                            emergencies. The signs and symptoms of potential                            delayed complications were discussed with the                            patient. Return to normal activities tomorrow.                            Written discharge instructions were provided to the                            patient.                           - Resume previous diet.                           - Continue  present medications.                           - MInimize Non-steroidals as able.                           - Await pathology results.                           - Repeat colonoscopy for surveillance based on                            pathology results.                           - Follow up in clinic to be dictated by final                            results of the biopsies. Procedure Code(s):        --- Professional ---                           [phone],  Colonoscopy, flexible; with removal of                            tumor(s), polyp(s), or other lesion(s) by snare                            technique                           45380, 59, Colonoscopy, flexible; with biopsy,                            single or multiple Diagnosis Code(s):        --- Professional ---                           Z12.11, Encounter for screening for malignant                            neoplasm of colon                           K64.1, Second degree hemorrhoids                           K63.89, Other specified diseases of intestine                           K63.5, Polyp of colon                           K52.9, Noninfective gastroenteritis and colitis,                            unspecified CPT copyright 2019 American Medical Association. All rights reserved. The codes documented in this report are preliminary and upon coder review may  be revised to meet current compliance requirements. Justice Britain, MD 08/02/2018 11:43:59 AM Number of Addenda: 0

## 2018-08-02 NOTE — Transfer of Care (Signed)
Immediate Anesthesia Transfer of Care Note  Patient: [name]  Procedure(s) Performed: COLONOSCOPY WITH PROPOFOL (N/A ) ESOPHAGOGASTRODUODENOSCOPY (EGD) WITH PROPOFOL (N/A ) BIOPSY HEMOSTASIS CLIP PLACEMENT POLYPECTOMY  Patient Location: PACU  Anesthesia Type:MAC  Level of Consciousness: awake and alert   Airway & Oxygen Therapy: Patient Spontanous Breathing and Patient connected to nasal cannula oxygen  Post-op Assessment: Report given to RN and Post -op Vital signs reviewed and stable  Post vital signs: Reviewed and stable  Last Vitals:  Vitals Value Taken Time  BP 116/85   Temp    Pulse 64   Resp 16   SpO2 99%     Last Pain:  Vitals:   08/02/18 0745  TempSrc: Temporal  PainSc: 0-No pain         Complications: No apparent anesthesia complications
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
## 2018-08-02 NOTE — Anesthesia Postprocedure Evaluation (Signed)
Anesthesia Post Note  Patient: [name]  Procedure(s) Performed: COLONOSCOPY WITH PROPOFOL (N/A ) ESOPHAGOGASTRODUODENOSCOPY (EGD) WITH PROPOFOL (N/A ) BIOPSY HEMOSTASIS CLIP PLACEMENT POLYPECTOMY     Patient location during evaluation: PACU Anesthesia Type: MAC Level of consciousness: awake Pain management: pain level controlled Vital Signs Assessment: post-procedure vital signs reviewed and stable Respiratory status: spontaneous breathing, nonlabored ventilation, respiratory function stable and patient connected to nasal cannula oxygen Cardiovascular status: stable and blood pressure returned to baseline Postop Assessment: no apparent nausea or vomiting Anesthetic complications: no    Last Vitals:  Vitals:   08/02/18 1145 08/02/18 1157  BP: 133/74 (!) 143/90  Pulse: (!) 43 (!) 49  Resp: 16 15  Temp:  36.4 C  SpO2: 100% 96%    Last Pain:  Vitals:   08/02/18 1157  TempSrc:   PainSc: 0-No pain                 Justine Dines P Greg Eckrich

## 2018-08-03 ENCOUNTER — Encounter (HOSPITAL_COMMUNITY): Payer: Self-pay | Admitting: Gastroenterology

## 2018-08-05 ENCOUNTER — Encounter: Payer: Self-pay | Admitting: Gastroenterology

## 2018-08-11 ENCOUNTER — Ambulatory Visit: Payer: Medicaid Other | Admitting: Surgery

## 2018-08-17 ENCOUNTER — Other Ambulatory Visit: Payer: Self-pay | Admitting: Emergency Medicine

## 2018-08-17 ENCOUNTER — Telehealth: Payer: Self-pay | Admitting: Emergency Medicine

## 2018-08-17 MED ORDER — ALBUTEROL SULFATE HFA 108 (90 BASE) MCG/ACT IN AERS: INHALATION_SPRAY | RESPIRATORY_TRACT | 5 refills | Status: DC

## 2018-08-17 NOTE — Telephone Encounter (Signed)
Called spoke with patient who reported that he is out of refills on his Proventil HFA - he is at the pharmacy now.  Advised patient will call the pharmacy to authorize refills.  Patient last seen in the office on 6.23.2020 by [facility] NP.  Called [facility] and spoke with pharmacist [facility] - verbal authorization for 5 additional refills given.  Med list updated. Nothing further needed; will sign off.

## 2018-08-24 ENCOUNTER — Other Ambulatory Visit: Payer: Self-pay

## 2018-08-25 ENCOUNTER — Encounter: Payer: Self-pay | Admitting: Surgery

## 2018-08-25 ENCOUNTER — Ambulatory Visit
Admission: RE | Admit: 2018-08-25 | Discharge: 2018-08-25 | Disposition: A | Discharge status: Home / Self Care | Payer: Medicaid Other | Source: Ambulatory Visit | Attending: Surgery | Admitting: Surgery

## 2018-08-25 ENCOUNTER — Ambulatory Visit: Payer: Medicaid Other | Admitting: Surgery

## 2018-08-25 VITALS — BP 123/78 | HR 49 | Temp 97.7°F | Resp 18 | Ht 69.0 in | Wt 146.0 lb

## 2018-08-25 DIAGNOSIS — Z85118 Personal history of other malignant neoplasm of bronchus and lung: Secondary | ICD-10-CM | POA: Diagnosis not present

## 2018-08-25 DIAGNOSIS — C3490 Malignant neoplasm of unspecified part of unspecified bronchus or lung: Secondary | ICD-10-CM

## 2018-08-25 NOTE — Progress Notes (Signed)
HPI:  Patient returns for routinesurveillancehaving undergoneRight VATS with wedge resection of a 1.4 cm RUL lung noduleon 07/03/2016. The final pathology showed a poorly differentiated adenocarcinoma with negative surgical margins.This is his second lung cancer resection having undergoneleft upper lobectomy on 06/12/2011 for a stage IB (T2A, N0, M0) non-small cell lung cancer consistent with adenocarcinoma. This was a 2.0 cm tumor with visceral pleural invasion.He feels okay overall. He has some exertional shortness of breath due to severe COPD and is followed by Dr. Lamonte Sakai.  This has been stable.  He continues to smoke about 9 cigarettes/day.  He took Chantix in the past but continued to smoke so he stopped that medication. He denies any sputum production or hemoptysis.  He has had no headaches or visual changes.  He has had no muscle or bone pain.  He has been wearing a mask.  Current Outpatient Medications  Medication Sig Dispense Refill  . acetaminophen (TYLENOL) 500 MG tablet Take 500 mg by mouth every 6 (six) hours as needed for mild pain or headache.     . albuterol (PROVENTIL HFA) 108 (90 Base) MCG/ACT inhaler INHALE 2 PUFFS BY MOUTH EVERY 6 HOURS AS NEEDED FOR WHEEZING OR SHORTNESS OF BREATH 8 g 5  . aspirin EC 81 MG tablet Take 81 mg by mouth at bedtime.    . Azelastine HCl 137 MCG/SPRAY SOLN USE 2 SPRAY(S) IN EACH NOSTRIL TWICE DAILY AS NEEDED FOR RHINITIS 30 mL 5  . Carboxymethylcellulose Sod PF (REFRESH PLUS) 0.5 % SOLN Place 2 drops into both eyes daily.    [name] gabapentin (NEURONTIN) 100 MG capsule Take 100 mg by mouth 2 (two) times daily.    . nicotine (NICODERM CQ) 21 mg/24hr patch Place 1 patch (21 mg total) onto the skin daily. 28 patch 3  . nicotine polacrilex (COMMIT) 4 MG lozenge Take 1 lozenge (4 mg total) by mouth as needed for smoking cessation. 100 tablet 3  . omeprazole (PRILOSEC) 40 MG capsule Take 1 capsule (40 mg total) by mouth 2 (two) times a day. 60 capsule  5  . OXYGEN Inhale 2 L into the lungs at bedtime.    [name] PROVENTIL HFA 108 (90 Base) MCG/ACT inhaler INHALE 2 PUFFS BY MOUTH EVERY 6 HOURS AS NEEDED FOR WHEEZING OR SHORTNESS OF BREATH 18 g 5  . umeclidinium-vilanterol (ANORO ELLIPTA) 62.5-25 MCG/INH AEPB Inhale 1 puff by mouth once daily 60 each 5   No current facility-administered medications for this visit.    BP 123/78 (BP Location: Right Arm, Patient Position: Sitting, Cuff Size: Normal)   Pulse (!) 49   Temp 97.7 F (36.5 C)   Resp 18   Ht 5\' 9"  (1.753 m)   Wt 146 lb (66.2 kg)   SpO2 90% Comment: RA  BMI 21.56 kg/m  He looks well. There is no cervical or supraclavicular adenopathy. Lungs are clear. Cardiac exam shows a regular rate and rhythm with normal heart sounds. The scars on both sides of the chest from his prior lung resections look fine with no sign of lesions.   Diagnostic Tests:  CLINICAL DATA:  Restaging lung cancer. History of left upper lobe lobectomy and right-sided VATS  EXAM: CT CHEST WITHOUT CONTRAST  TECHNIQUE: Multidetector CT imaging of the chest was performed following the standard protocol without IV contrast.  COMPARISON:  Chest CT 01/06/2018  FINDINGS: Cardiovascular: The heart is normal in size. No pericardial effusion. There is mild tortuosity, ectasia and scattered calcifications involving the thoracic  aorta. Stable appearing fairly extensive three-vessel coronary artery calcifications.  Mediastinum/Nodes: Small scattered mediastinal and hilar lymph nodes are stable. These all measure less than 8 mm.  The esophagus is grossly normal.  Lungs/Pleura: Stable surgical changes from a left upper lobe lobectomy and right upper lobe wedge resection. No findings suspicious for recurrent tumor.  Stable advanced emphysematous changes and pulmonary scarring. No worrisome pulmonary lesions or new pulmonary nodules to suggest pulmonary metastatic disease. No infiltrates, edema or  effusions. Stable areas of traction type bronchiectasis.  Upper Abdomen: No significant upper abdominal findings. No hepatic or adrenal gland lesions are identified without contrast. No upper abdominal adenopathy. There is a metallic foreign body noted in the stomach which is likely a hemostasis clip from prior endoscopy 08/02/2018.  Musculoskeletal: No chest wall mass, supraclavicular or axillary adenopathy.  The bony thorax is intact. No worrisome bone lesions. Cervical spine fusion hardware is noted.  IMPRESSION: 1. Stable surgical changes both hemithoraces. No findings suspicious for recurrent tumor, metastatic adenopathy or metastatic pulmonary nodules. 2. Stable advanced emphysematous changes and pulmonary scarring. No acute overlying pulmonary process. 3. No findings suspicious for upper abdominal metastatic disease. 4. Stable age advanced coronary artery calcifications.  Aortic Atherosclerosis (ICD10-I70.0) and Emphysema (ICD10-J43.9).   Electronically Signed   By: Marijo Sanes M.D.   On: 08/25/2018 09:23   Impression:  [name] is now a little over 2 years following his last lung cancer resection.  There is no sign of recurrent or new lung cancer on his CT scan.  I encouraged him to stop smoking.  He understands that this increases the risk of progressively worsening COPD and recurrent lung cancer.  I reviewed the CT images with him and answered his questions.  Plan:  I will see him back in 6 months with a CT scan of the chest without contrast for lung cancer surveillance.  I spent 15 minutes performing this established patient evaluation and > 50% of this time was spent face to face counseling and coordinating his lung cancer surveillance.    Gaye Pollack, MD [facility] [phone]

## 2018-09-01 IMAGING — CT CT CHEST W/O CM
3 of 4 series · 17 of 30 positions shown, 19 images · non-contrast
Comparison: 11/14/2015 and 07/25/2015

CLINICAL DATA: Followup right upper lobe pulmonary nodule.
Emphysema. Previous left upper lobectomy for left lung carcinoma.

EXAM:
CT CHEST WITHOUT CONTRAST
TECHNIQUE: Multidetector CT imaging of the chest was performed following the
standard protocol without IV contrast.

[Series 3: chest w/o · axial · non-contrast · 0.70mm/px · z∈[-302,-39]mm · 7 of 141 slices shown, 9 images]
[im 18/141  mediastinal]
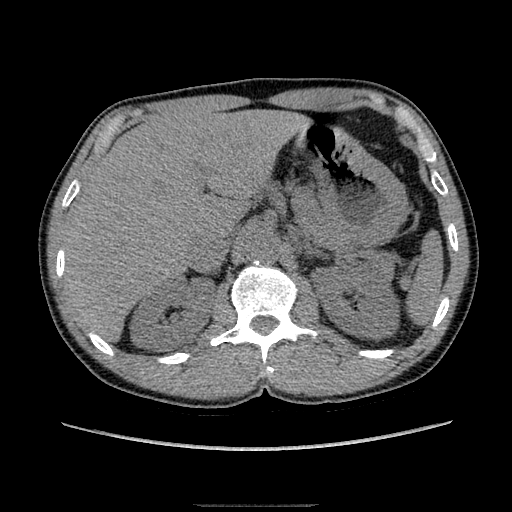
[im 18/141  lung]
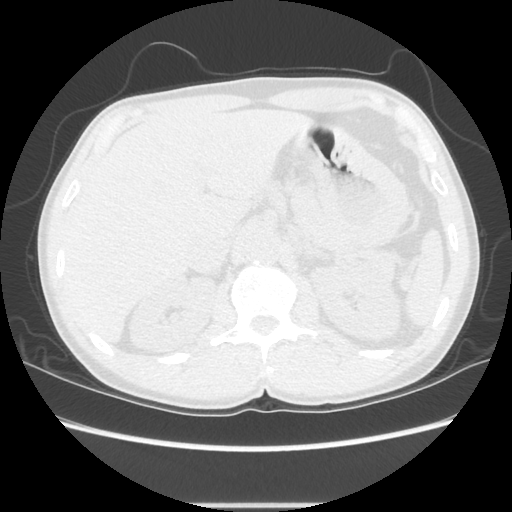
[im 36/141  lung]
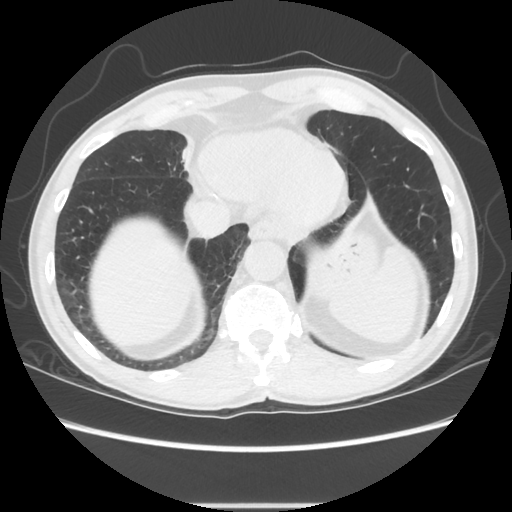
[im 53/141  lung]
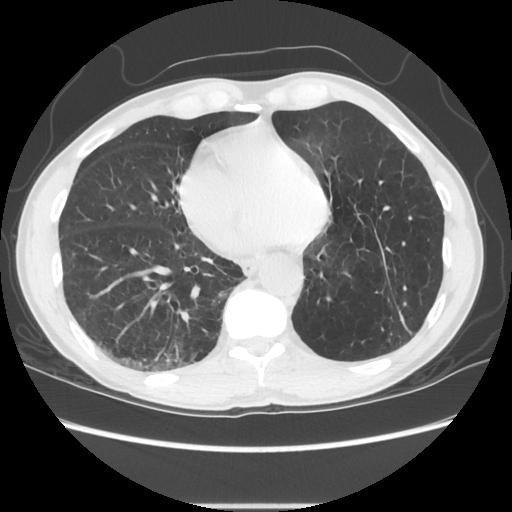
[im 71/141  lung]
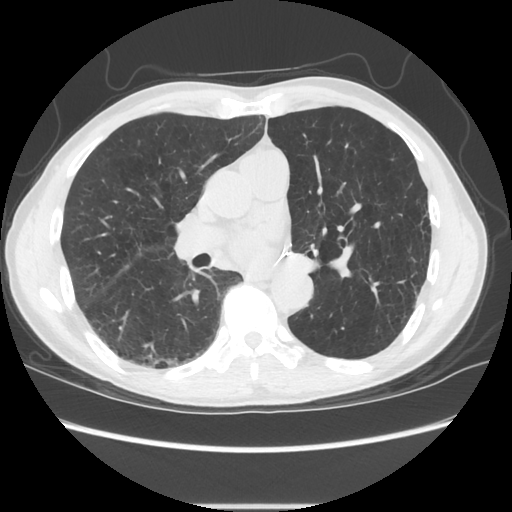
[im 88/141  mediastinal]
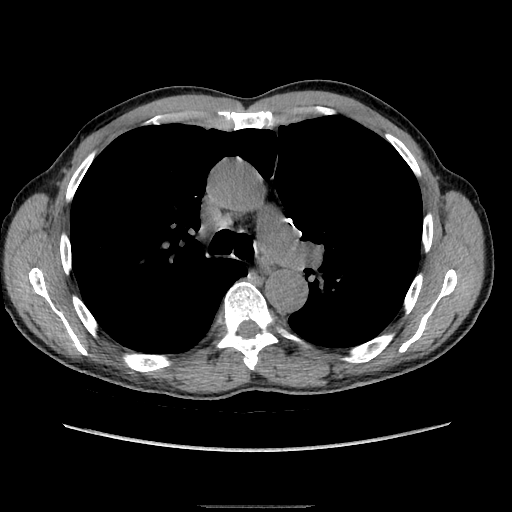
[im 88/141  lung]
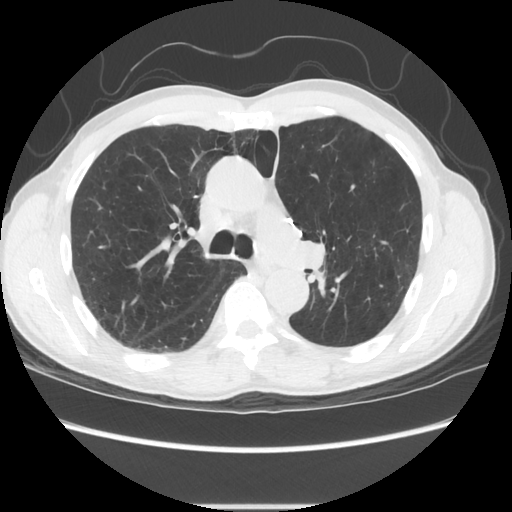
[im 106/141  lung]
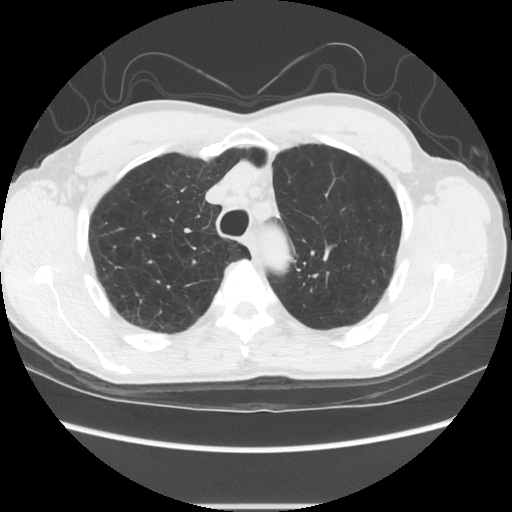
[im 123/141  lung]
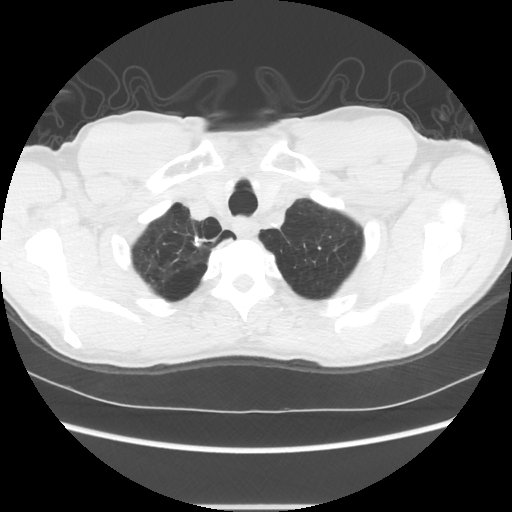

[Series 4: lung windows · axial · 0.70mm/px · z∈[-302,-39]mm · 7 of 141 slices shown]
[im 18/141  lung]
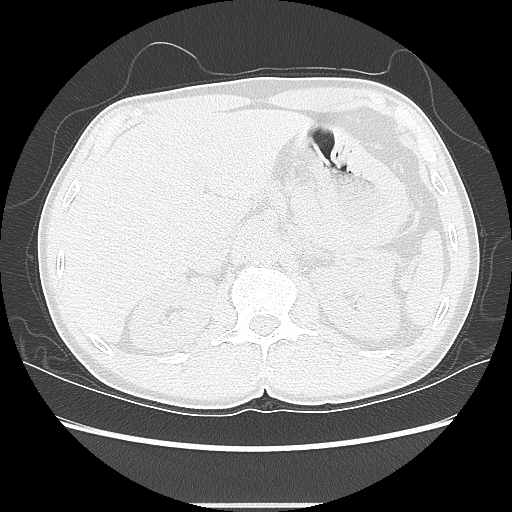
[im 36/141  lung]
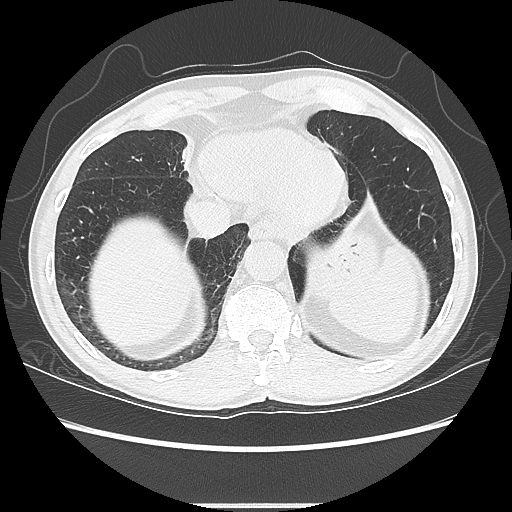
[im 53/141  lung]
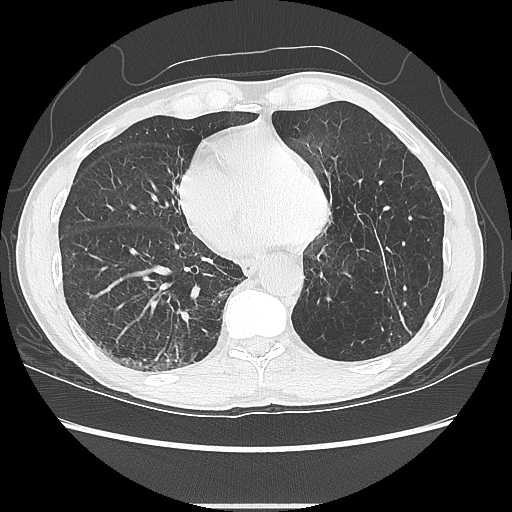
[im 71/141  lung]
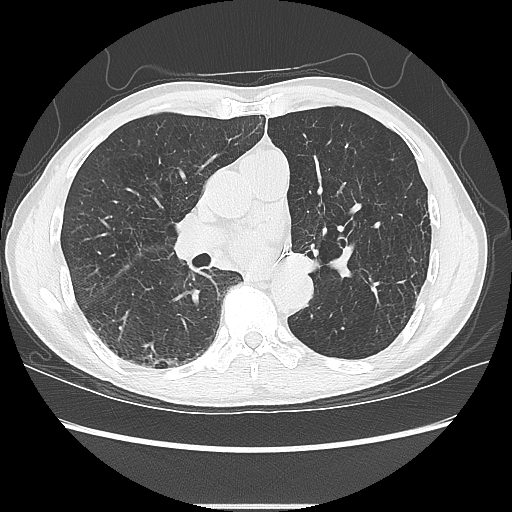
[im 88/141  lung]
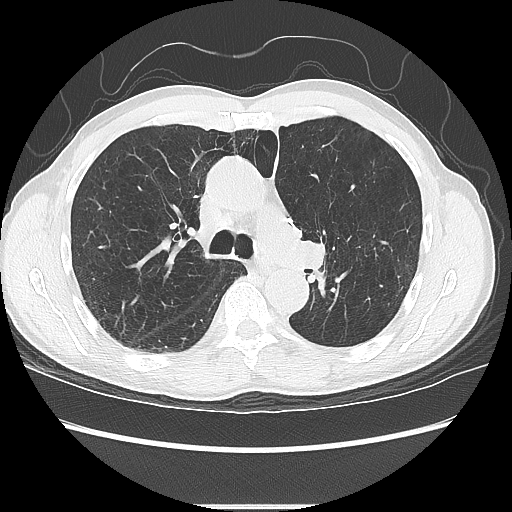
[im 106/141  lung]
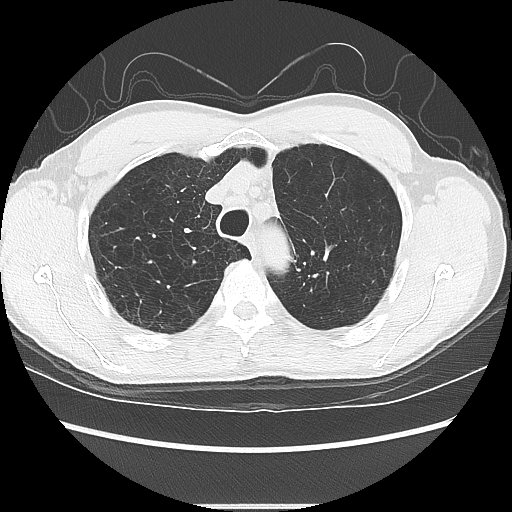
[im 123/141  lung]
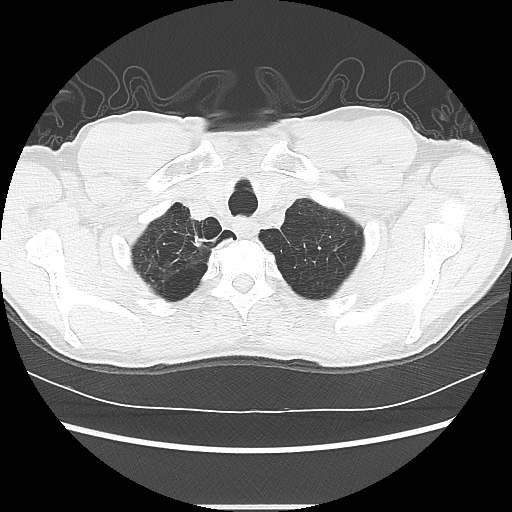

[Series 602: sagittal body · sagittal · 0.70mm/px · 3 of 145 slices shown]
[im 17/145  mediastinal]
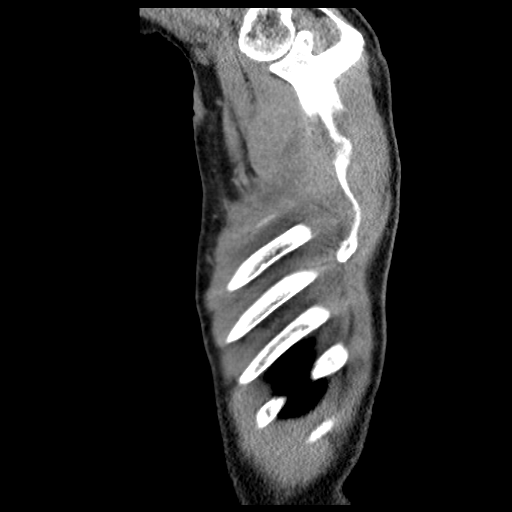
[im 33/145  mediastinal]
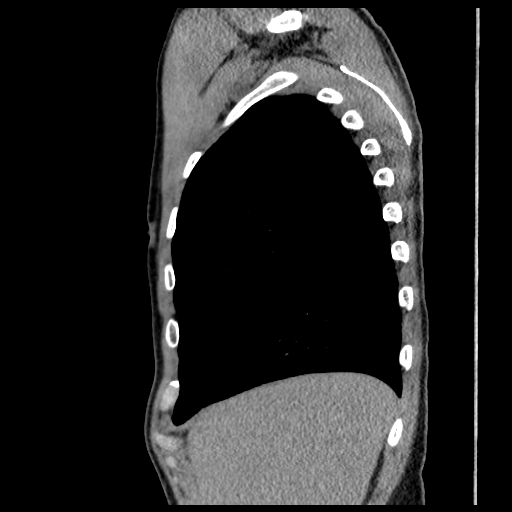
[im 49/145  mediastinal]
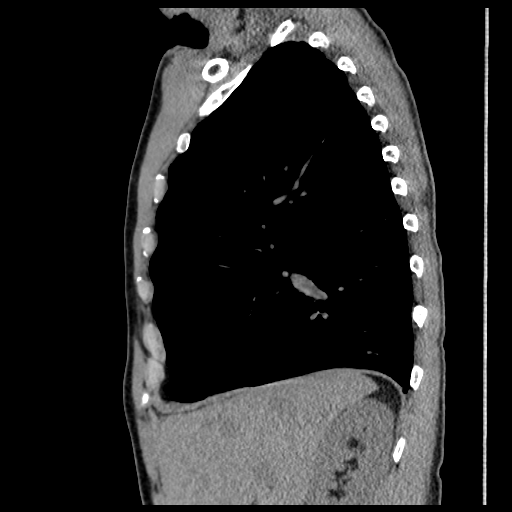

[17 of 30 positions shown; findings below may reference images not displayed]

FINDINGS: Cardiovascular: No acute findings. Aortic and coronary artery
atherosclerosis.

Mediastinum/Nodes: No masses or pathologically enlarged lymph nodes
identified on this unenhanced exam.

Lungs/Pleura: Postop changes from previous left upper lobectomy.
Moderate emphysema again demonstrated. Spiculated nodule in the
medial right lung apex measures 10 x 8 mm on image [DATE], with mild
increase in size compared to previous studies. This is highly
suspicious for primary bronchogenic carcinoma. No other suspicious
pulmonary nodules or masses are identified. No evidence of pleural
effusion.

Upper Abdomen:  Unremarkable.

Musculoskeletal:  No suspicious bone lesions.
IMPRESSION: Continued slight increase in size of 9 mm mean diameter spiculated
nodule in the right lung apex compared to prior exams, highly
suspicious for primary bronchogenic carcinoma. Recommend PET-CT scan
for further evaluation.

No evidence of thoracic metastatic disease.

Moderate emphysema and previous left upper lobectomy.

Aortic and coronary artery atherosclerosis.
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
## 2018-09-28 ENCOUNTER — Encounter: Payer: Self-pay | Admitting: Pulmonary Disease

## 2018-09-28 ENCOUNTER — Ambulatory Visit (INDEPENDENT_AMBULATORY_CARE_PROVIDER_SITE_OTHER): Payer: Medicaid Other | Admitting: Pulmonary Disease

## 2018-09-28 ENCOUNTER — Other Ambulatory Visit: Payer: Self-pay

## 2018-09-28 VITALS — BP 108/72 | HR 74 | Temp 97.8°F | Ht 69.0 in | Wt 143.8 lb

## 2018-09-28 DIAGNOSIS — K219 Gastro-esophageal reflux disease without esophagitis: Secondary | ICD-10-CM

## 2018-09-28 DIAGNOSIS — J449 Chronic obstructive pulmonary disease, unspecified: Secondary | ICD-10-CM | POA: Diagnosis not present

## 2018-09-28 DIAGNOSIS — Z720 Tobacco use: Secondary | ICD-10-CM

## 2018-09-28 DIAGNOSIS — F17210 Nicotine dependence, cigarettes, uncomplicated: Secondary | ICD-10-CM | POA: Diagnosis not present

## 2018-09-28 MED ORDER — ANORO ELLIPTA 62.5-25 MCG/INH IN AEPB: 1.0000 | INHALATION_SPRAY | Freq: Every day | RESPIRATORY_TRACT | 0 refills | Status: DC

## 2018-09-28 NOTE — Assessment & Plan Note (Signed)
Assessment: December/2018 CT chest showing emphysema Status post lung resections January/2018 pulmonary function test with a DLCO of 23, FEV1 1.78) 59% predicted), COPD Gold 2 Maintained on Anoro Ellipta MMRC 2 Current every day smoker -7 cigarettes a day  Plan: Continue Anoro Ellipta inhaler You need to stop smoking Follow-up with our office in 2-3 months Offered Pneumovax 23 today, patient declined Offered influenza vaccine today, patient declined

## 2018-09-28 NOTE — Patient Instructions (Addendum)
Anoro Ellipta  >>> Take 1 puff daily in the morning right when you wake up >>>Rinse your mouth out after use >>>This is a daily maintenance inhaler, NOT a rescue inhaler >>>Contact our office if you are having difficulties affording or obtaining this medication >>>It is important for you to be able to take this daily and not miss any doses  Only use your albuterol as a rescue medication to be used if you can't catch your breath by resting or doing a relaxed purse lip breathing pattern.  - The less you use it, the better it will work when you need it. - Ok to use up to 2 puffs every 4 hours if you must but call for immediate appointment if use goes up over your usual need - Don't leave home without it !! (think of it like the spare tire for your car)     Note your daily symptoms >remember "red flags" for COPD:  >>>Increase in cough >>>increase in sputum production >>>increase in shortness of breath or activity  intolerance.   If you notice these symptoms, please call the office to be seen.    We recommend that you stop smoking.  >>>You need to set a quit date >>>If you have friends or family who smoke, let them know you are trying to quit and not to smoke around you or in your living environment  Smoking Cessation Resources:  1 800 QUIT NOW  >>> Patient to call this resource and utilize it to help support her quit smoking >>> Keep up your hard work with stopping smoking  You can also contact the [facility] >>>For smoking cessation classes call [phone]  We do not recommend using e-cigarettes as a form of stopping smoking  You can sign up for smoking cessation support texts and information:  >>>https://smokefree.gov/smokefreetxt  You declined Flu or Pneumovax23 today      GERD management: >>>Avoid laying flat until 2 hours after meals >>>Elevate head of the bed including entire chest >>>Reduce size of meals and amount of fat, acid, spices,  caffeine and sweets >>>If you are smoking, Please stop! >>>Decrease alcohol consumption >>>Work on maintaining a healthy weight with normal BMI    Omeprazole 40 mg tablet  >>>Please take 1 tablet daily 15 minutes to 30 minutes before your first meal of the day as well as before your other medications >>>Try to take at the same time each day >>>take this medication daily     Follow Up:    Return in about 3 months (around 12/29/2018), or if symptoms worsen or fail to improve, for Follow up with Dr. Lamonte Sakai.   Please do your part to reduce the spread of COVID-19:      Reduce your risk of any infection  and COVID19 by using the similar precautions used for avoiding the common cold or flu:  [name] Wash your hands often with soap and warm water for at least 20 seconds.  If soap and water are not readily available, use an alcohol-based hand sanitizer with at least 60% alcohol.  . If coughing or sneezing, cover your mouth and nose by coughing or sneezing into the elbow areas of your shirt or coat, into a tissue or into your sleeve (not your hands). Langley Gauss A MASK when in public  . Avoid shaking hands with others and consider head nods or verbal greetings only. . Avoid touching your eyes, nose, or mouth with unwashed hands.  . Avoid close contact with people  who are sick. . Avoid places or events with large numbers of people in one location, like concerts or sporting events. . If you have some symptoms but not all symptoms, continue to monitor at home and seek medical attention if your symptoms worsen. . If you are having a medical emergency, call 911.   [place] / e-Visit: [email or web address]         [facility]: [facility]: [phone]                   [facility]: [phone]     It is flu season:   >>> Best ways to protect  herself from the flu: Receive the yearly flu vaccine, practice good hand hygiene washing with soap and also using hand sanitizer when available, eat a nutritious meals, get adequate rest, hydrate appropriately   Please contact the office if your symptoms worsen or you have concerns that you are not improving.   Thank you for choosing [facility] for your healthcare, and for allowing [place] to partner with you on your healthcare journey. I am thankful to be able to provide care to you today.   Wyn Quaker FNP-C     Gastroesophageal Reflux Disease, Adult Gastroesophageal reflux (GER) happens when acid from the stomach flows up into the tube that connects the mouth and the stomach (esophagus). Normally, food travels down the esophagus and stays in the stomach to be digested. With GER, food and stomach acid sometimes move back up into the esophagus. You may have a disease called gastroesophageal reflux disease (GERD) if the reflux:  Happens often.  Causes frequent or very bad symptoms.  Causes problems such as damage to the esophagus. When this happens, the esophagus becomes sore and swollen (inflamed). Over time, GERD can make small holes (ulcers) in the lining of the esophagus. What are the causes? This condition is caused by a problem with the muscle between the esophagus and the stomach. When this muscle is weak or not normal, it does not close properly to keep food and acid from coming back up from the stomach. The muscle can be weak because of:  Tobacco use.  Pregnancy.  Having a certain type of hernia (hiatal hernia).  Alcohol use.  Certain foods and drinks, such as coffee, chocolate, onions, and peppermint. What increases the risk? You are more likely to develop this condition if you:  Are overweight.  Have a disease that affects your connective tissue.  Use NSAID medicines. What are the signs or symptoms? Symptoms of this condition  include:  Heartburn.  Difficult or painful swallowing.  The feeling of having a lump in the throat.  A bitter taste in the mouth.  Bad breath.  Having a lot of saliva.  Having an upset or bloated stomach.  Belching.  Chest pain. Different conditions can cause chest pain. Make sure you see your doctor if you have chest pain.  Shortness of breath or noisy breathing (wheezing).  Ongoing (chronic) cough or a cough at night.  Wearing away of the surface of teeth (tooth enamel).  Weight loss. How is this treated? Treatment will depend on how bad your symptoms are. Your doctor may suggest:  Changes to your diet.  Medicine.  Surgery. Follow these instructions at home: Eating and drinking   Follow a diet as told by your doctor. You may need to avoid foods and drinks such as: ?  Coffee and tea (with or without caffeine). ? Drinks that contain alcohol. ? Energy drinks and sports drinks. ? Bubbly (carbonated) drinks or sodas. ? Chocolate and cocoa. ? Peppermint and mint flavorings. ? Garlic and onions. ? Horseradish. ? Spicy and acidic foods. These include peppers, chili powder, curry powder, vinegar, hot sauces, and BBQ sauce. ? Citrus fruit juices and citrus fruits, such as oranges, lemons, and limes. ? Tomato-based foods. These include red sauce, chili, salsa, and pizza with red sauce. ? Fried and fatty foods. These include donuts, french fries, potato chips, and high-fat dressings. ? High-fat meats. These include hot dogs, rib eye steak, sausage, ham, and bacon. ? High-fat dairy items, such as whole milk, butter, and cream cheese.  Eat small meals often. Avoid eating large meals.  Avoid drinking large amounts of liquid with your meals.  Avoid eating meals during the 2-3 hours before bedtime.  Avoid lying down right after you eat.  Do not exercise right after you eat. Lifestyle   Do not use any products that contain nicotine or tobacco. These include  cigarettes, e-cigarettes, and chewing tobacco. If you need help quitting, ask your doctor.  Try to lower your stress. If you need help doing this, ask your doctor.  If you are overweight, lose an amount of weight that is healthy for you. Ask your doctor about a safe weight loss goal. General instructions  Pay attention to any changes in your symptoms.  Take over-the-counter and prescription medicines only as told by your doctor. Do not take aspirin, ibuprofen, or other NSAIDs unless your doctor says it is okay.  Wear loose clothes. Do not wear anything tight around your waist.  Raise (elevate) the head of your bed about 6 inches (15 cm).  Avoid bending over if this makes your symptoms worse.  Keep all follow-up visits as told by your doctor. This is important. Contact a doctor if:  You have new symptoms.  You lose weight and you do not know why.  You have trouble swallowing or it hurts to swallow.  You have wheezing or a cough that keeps happening.  Your symptoms do not get better with treatment.  You have a hoarse voice. Get help right away if:  You have pain in your arms, neck, jaw, teeth, or back.  You feel sweaty, dizzy, or light-headed.  You have chest pain or shortness of breath.  You throw up (vomit) and your throw-up looks like blood or coffee grounds.  You pass out (faint).  Your poop (stool) is bloody or black.  You cannot swallow, drink, or eat. Summary  If a person has gastroesophageal reflux disease (GERD), food and stomach acid move back up into the esophagus and cause symptoms or problems such as damage to the esophagus.  Treatment will depend on how bad your symptoms are.  Follow a diet as told by your doctor.  Take all medicines only as told by your doctor. This information is not intended to replace advice given to you by your health care provider. Make sure you discuss any questions you have with your health care provider. Document Released:  07/09/2007 Document Revised: 07/29/2017 Document Reviewed: 07/29/2017 Elsevier Patient Education  2020 [place] for Gastroesophageal Reflux Disease, Adult When you have gastroesophageal reflux disease (GERD), the foods you eat and your eating habits are very important. Choosing the right foods can help ease your discomfort. Think about working with a nutrition specialist (dietitian) to help you make  good choices. What are tips for following this plan?  Meals  Choose healthy foods that are low in fat, such as fruits, vegetables, whole grains, low-fat dairy products, and lean meat, fish, and poultry.  Eat small meals often instead of 3 large meals a day. Eat your meals slowly, and in a place where you are relaxed. Avoid bending over or lying down until 2-3 hours after eating.  Avoid eating meals 2-3 hours before bed.  Avoid drinking a lot of liquid with meals.  Cook foods using methods other than frying. Bake, grill, or broil food instead.  Avoid or limit: ? Chocolate. ? Peppermint or spearmint. ? Alcohol. ? Pepper. ? Black and decaffeinated coffee. ? Black and decaffeinated tea. ? Bubbly (carbonated) soft drinks. ? Caffeinated energy drinks and soft drinks.  Limit high-fat foods such as: ? Fatty meat or fried foods. ? Whole milk, cream, butter, or ice cream. ? Nuts and nut butters. ? Pastries, donuts, and sweets made with butter or shortening.  Avoid foods that cause symptoms. These foods may be different for everyone. Common foods that cause symptoms include: ? Tomatoes. ? Oranges, lemons, and limes. ? Peppers. ? Spicy food. ? Onions and garlic. ? Vinegar. Lifestyle  Maintain a healthy weight. Ask your doctor what weight is healthy for you. If you need to lose weight, work with your doctor to do so safely.  Exercise for at least 30 minutes for 5 or more days each week, or as told by your doctor.  Wear loose-fitting clothes.  Do not smoke. If  you need help quitting, ask your doctor.  Sleep with the head of your bed higher than your feet. Use a wedge under the mattress or blocks under the bed frame to raise the head of the bed. Summary  When you have gastroesophageal reflux disease (GERD), food and lifestyle choices are very important in easing your symptoms.  Eat small meals often instead of 3 large meals a day. Eat your meals slowly, and in a place where you are relaxed.  Limit high-fat foods such as fatty meat or fried foods.  Avoid bending over or lying down until 2-3 hours after eating.  Avoid peppermint and spearmint, caffeine, alcohol, and chocolate. This information is not intended to replace advice given to you by your health care provider. Make sure you discuss any questions you have with your health care provider. Document Released: 07/22/2011 Document Revised: 05/13/2018 Document Reviewed: 02/26/2016 Elsevier Patient Education  2020 [place] Risks of Smoking Smoking cigarettes is very bad for your health. Tobacco smoke has over 200 known poisons in it. It contains the poisonous gases nitrogen oxide and carbon monoxide. There are over 60 chemicals in tobacco smoke that cause cancer. Smoking is difficult to quit because a chemical in tobacco, called nicotine, causes addiction or dependence. When you smoke and inhale, nicotine is absorbed rapidly into the bloodstream through your lungs. Both inhaled and non-inhaled nicotine may be addictive. What are the risks of cigarette smoke? Cigarette smokers have an increased risk of many serious medical problems, including:  Lung cancer.  Lung disease, such as pneumonia, bronchitis, and emphysema.  Chest pain (angina) and heart attack because the heart is not getting enough oxygen.  Heart disease and peripheral blood vessel disease.  High blood pressure (hypertension).  Stroke.  Oral cancer, including cancer of the lip, mouth, or voice box.  Bladder  cancer.  Pancreatic cancer.  Cervical cancer.  Pregnancy complications, including premature  birth.  Beola Cord and smaller newborn babies, birth defects, and genetic damage to sperm.  Early menopause.  Lower estrogen level for women.  Infertility.  Facial wrinkles.  Blindness.  Increased risk of broken bones (fractures).  Senile dementia.  Stomach ulcers and internal bleeding.  Delayed wound healing and increased risk of complications during surgery.  Even smoking lightly shortens your life expectancy by several years. Because of secondhand smoke exposure, children of smokers have an increased risk of the following:  Sudden infant death syndrome (SIDS).  Respiratory infections.  Lung cancer.  Heart disease.  Ear infections. What are the benefits of quitting? There are many health benefits of quitting smoking. Here are some of them:  Within days of quitting smoking, your risk of having a heart attack decreases, your blood flow improves, and your lung capacity improves. Blood pressure, pulse rate, and breathing patterns start returning to normal soon after quitting.  Within months, your lungs may clear up completely.  Quitting for 10 years reduces your risk of developing lung cancer and heart disease to almost that of a nonsmoker.  People who quit may see an improvement in their overall quality of life. How do I quit smoking?     Smoking is an addiction with both physical and psychological effects, and longtime habits can be hard to change. Your health care provider can recommend:  Programs and community resources, which may include group support, education, or talk therapy.  Prescription medicines to help reduce cravings.  Nicotine replacement products, such as patches, gum, and nasal sprays. Use these products only as directed. Do not replace cigarette smoking with electronic cigarettes, which are commonly called e-cigarettes. The safety of e-cigarettes is  not known, and some may contain harmful chemicals.  A combination of two or more of these methods. Where to find more information  American Lung Association: www.lung.org  American Cancer Society: www.cancer.org Summary  Smoking cigarettes is very bad for your health. Cigarette smokers have an increased risk of many serious medical problems, including several cancers, heart disease, and stroke.  Smoking is an addiction with both physical and psychological effects, and longtime habits can be hard to change.  By stopping right away, you can greatly reduce the risk of medical problems for you and your family.  To help you quit smoking, your health care provider can recommend programs, community resources, prescription medicines, and nicotine replacement products such as patches, gum, and nasal sprays. This information is not intended to replace advice given to you by your health care provider. Make sure you discuss any questions you have with your health care provider. Document Released: 02/28/2004 Document Revised: 04/23/2017 Document Reviewed: 01/25/2016 Elsevier Patient Education  2020 [employer].

## 2018-09-28 NOTE — Assessment & Plan Note (Signed)
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

## 2018-09-28 NOTE — Assessment & Plan Note (Signed)
Plan: Continue omeprazole 40 mg Review diet information provided on AVS If acid reflux worsens please follow-up with primary care or gastroenterology

## 2018-10-20 ENCOUNTER — Other Ambulatory Visit: Payer: Self-pay | Admitting: Family Medicine

## 2018-10-20 DIAGNOSIS — R109 Unspecified abdominal pain: Secondary | ICD-10-CM

## 2018-10-27 ENCOUNTER — Ambulatory Visit
Admission: RE | Admit: 2018-10-27 | Discharge: 2018-10-27 | Disposition: A | Discharge status: Home / Self Care | Payer: Medicaid Other | Source: Ambulatory Visit | Attending: Family Medicine | Admitting: Family Medicine

## 2018-10-27 DIAGNOSIS — R109 Unspecified abdominal pain: Secondary | ICD-10-CM

## 2018-11-09 ENCOUNTER — Ambulatory Visit: Payer: Medicaid Other | Admitting: Cardiology

## 2018-11-09 ENCOUNTER — Encounter: Payer: Self-pay | Admitting: Cardiology

## 2018-11-09 ENCOUNTER — Other Ambulatory Visit: Payer: Self-pay

## 2018-11-09 VITALS — BP 125/83 | HR 53 | Temp 98.1°F | Ht 69.0 in | Wt 145.0 lb

## 2018-11-09 DIAGNOSIS — J449 Chronic obstructive pulmonary disease, unspecified: Secondary | ICD-10-CM

## 2018-11-09 DIAGNOSIS — C3490 Malignant neoplasm of unspecified part of unspecified bronchus or lung: Secondary | ICD-10-CM | POA: Diagnosis not present

## 2018-11-09 DIAGNOSIS — R0789 Other chest pain: Secondary | ICD-10-CM

## 2018-11-09 NOTE — Assessment & Plan Note (Signed)
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
## 2018-11-09 NOTE — Assessment & Plan Note (Signed)
[name] lung mass on CT 04/2011 > s/p [name] (Dr. Cyndia Bent on 06/12/11 )   RUL VATS May 2018

## 2018-11-09 NOTE — Patient Instructions (Signed)
Medication Instructions:  Your physician recommends that you continue on your current medications as directed. Please refer to the Current Medication list given to you today. If you need a refill on your cardiac medications before your next appointment, please call your pharmacy.   Lab work: Your physician recommends that you return for lab work in: [place] I If you have labs (blood work) drawn today and your tests are completely normal, you will receive your results only by: [name] MyChart Message (if you have MyChart) OR . A paper copy in the mail If you have any lab test that is abnormal or we need to change your treatment, we will call you to review the results.  Testing/Procedures: Your physician has requested that you have an echocardiogram. Echocardiography is a painless test that uses sound waves to create images of your heart. It provides your doctor with information about the size and shape of your heart and how well your heart's chambers and valves are working. This procedure takes approximately one hour. There are no restrictions for this procedure. [place]: At [facility], you and your health needs are our priority.  As part of our continuing mission to provide you with exceptional heart care, we have created designated Provider Care Teams.  These Care Teams include your primary Cardiologist (physician) and Advanced Practice Providers (APPs -  Physician Assistants and Nurse Practitioners) who all work together to provide you with the care you need, when you need it.  . FOLLOW UP WILL BE PENDING THE RESULTS OF YOUR ECHO AND LAB TEST   Any Other Special Instructions Will Be Listed Below (If Applicable).

## 2018-11-10 LAB — TROPONIN I: Troponin I: <0.01 ng/mL (ref 0.00–0.04)

## 2018-11-15 ENCOUNTER — Ambulatory Visit (HOSPITAL_COMMUNITY): Payer: Medicaid Other | Attending: Cardiology

## 2018-11-15 ENCOUNTER — Other Ambulatory Visit: Payer: Self-pay

## 2018-11-15 DIAGNOSIS — C3490 Malignant neoplasm of unspecified part of unspecified bronchus or lung: Secondary | ICD-10-CM | POA: Insufficient documentation

## 2018-11-15 DIAGNOSIS — J449 Chronic obstructive pulmonary disease, unspecified: Secondary | ICD-10-CM | POA: Diagnosis not present

## 2018-11-15 DIAGNOSIS — R0789 Other chest pain: Secondary | ICD-10-CM | POA: Diagnosis present

## 2018-11-18 ENCOUNTER — Encounter (HOSPITAL_COMMUNITY): Payer: Self-pay

## 2018-11-18 ENCOUNTER — Ambulatory Visit (HOSPITAL_COMMUNITY)
Admission: EM | Admit: 2018-11-18 | Discharge: 2018-11-18 | Disposition: A | Discharge status: Home / Self Care | Payer: Medicaid Other | Attending: Family Medicine | Admitting: Family Medicine

## 2018-11-18 ENCOUNTER — Other Ambulatory Visit: Payer: Self-pay

## 2018-11-18 DIAGNOSIS — Z20828 Contact with and (suspected) exposure to other viral communicable diseases: Secondary | ICD-10-CM | POA: Diagnosis not present

## 2018-11-18 DIAGNOSIS — Z85118 Personal history of other malignant neoplasm of bronchus and lung: Secondary | ICD-10-CM | POA: Diagnosis not present

## 2018-11-18 DIAGNOSIS — J449 Chronic obstructive pulmonary disease, unspecified: Secondary | ICD-10-CM | POA: Diagnosis present

## 2018-11-18 DIAGNOSIS — Z20822 Contact with and (suspected) exposure to covid-19: Secondary | ICD-10-CM

## 2018-11-18 NOTE — ED Triage Notes (Signed)
Pt presents to UC stating he would like a covid. Pt reports he may have had a positive covid exposure but is not sure.
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
## 2018-11-21 LAB — NOVEL CORONAVIRUS, NAA (HOSP ORDER, SEND-OUT TO REF LAB; TAT 18-24 HRS): SARS-CoV-2, NAA: NOT DETECTED

## 2018-12-01 ENCOUNTER — Other Ambulatory Visit: Payer: Self-pay | Admitting: Family Medicine

## 2018-12-01 DIAGNOSIS — N2889 Other specified disorders of kidney and ureter: Secondary | ICD-10-CM

## 2018-12-06 ENCOUNTER — Ambulatory Visit
Admission: RE | Admit: 2018-12-06 | Discharge: 2018-12-06 | Disposition: A | Discharge status: Home / Self Care | Payer: Medicaid Other | Source: Ambulatory Visit | Attending: Family Medicine | Admitting: Family Medicine

## 2018-12-06 DIAGNOSIS — N2889 Other specified disorders of kidney and ureter: Secondary | ICD-10-CM

## 2018-12-06 MED ORDER — IOPAMIDOL (ISOVUE-300) INJECTION 61%
100.0000 mL | Freq: Once | INTRAVENOUS | Status: AC | PRN
  Administered 2018-12-06: 12:00:00 100 mL via INTRAVENOUS

## 2018-12-23 ENCOUNTER — Other Ambulatory Visit: Payer: Self-pay | Admitting: Urology

## 2018-12-23 DIAGNOSIS — D4102 Neoplasm of uncertain behavior of left kidney: Secondary | ICD-10-CM

## 2018-12-29 ENCOUNTER — Encounter: Payer: Self-pay | Admitting: Emergency Medicine

## 2018-12-29 ENCOUNTER — Other Ambulatory Visit: Payer: Self-pay

## 2018-12-29 ENCOUNTER — Ambulatory Visit (INDEPENDENT_AMBULATORY_CARE_PROVIDER_SITE_OTHER): Payer: Medicaid Other | Admitting: Emergency Medicine

## 2018-12-29 DIAGNOSIS — J449 Chronic obstructive pulmonary disease, unspecified: Secondary | ICD-10-CM

## 2018-12-29 MED ORDER — ALBUTEROL SULFATE HFA 108 (90 BASE) MCG/ACT IN AERS: INHALATION_SPRAY | RESPIRATORY_TRACT | 3 refills | Status: DC

## 2018-12-29 MED ORDER — ANORO ELLIPTA 62.5-25 MCG/INH IN AEPB: INHALATION_SPRAY | RESPIRATORY_TRACT | 3 refills | Status: DC

## 2018-12-29 NOTE — Progress Notes (Signed)
Virtual Visit via Telephone Note  I connected with [name] on 12/29/18 at 11:15 AM EST by telephone and verified that I am speaking with the correct person using two identifiers.  Location: Patient: Home Provider: Office   I discussed the limitations, risks, security and privacy concerns of performing an evaluation and management service by telephone and the availability of in person appointments. I also discussed with the patient that there may be a patient responsible charge related to this service. The patient expressed understanding and agreed to proceed.   History of Present Illness: 63 year old smoker with a history of severe COPD, adenocarcinoma of the lung status post left upper lobe resection, right upper lobe nodule resection.  Last seen in our office by B.  Mack 09/28/2018.  He has been managed on Anoro.   Observations/Objective: He continues to smoke approximately 8-10 cig a day.  He has albuterol, uses it approximately. He has to pace himself with walking, has to slow down some times. He is active, gets out daily, able to lift light weight. No significant cough or wheeze. He does see mucous in the am. No flares. Last CT scan of the chest was 08/25/2018, reviewed by me and shows stable bilateral postsurgical changes without any evidence for recurrence, emphysema. Got the flu shot and PNA are up to date.   Assessment and Plan: Plan to continue Anoro, albuterol prn.  Discussed smoking cessation today - goal to get to 5 cig a day by next time.  Flu and PNA shots  F/u with Dr Cyndia Bent as planned to follow his lung CA and serial CT's >> January 2021  Follow Up Instructions: 3 months via phone or in person   I discussed the assessment and treatment plan with the patient. The patient was provided an opportunity to ask questions and all were answered. The patient agreed with the plan and demonstrated an understanding of the instructions.   The patient was advised to call back or  seek an in-person evaluation if the symptoms worsen or if the condition fails to improve as anticipated.  I provided 10 minutes of non-face-to-face time during this encounter.   Collene Gobble, MD

## 2019-01-14 ENCOUNTER — Telehealth: Payer: Self-pay | Admitting: Emergency Medicine

## 2019-01-14 NOTE — Telephone Encounter (Signed)
Spoke with patient. He stated that [employer] called him and stated they needed to get information from our office in regards to his O2 concentrator. Advised him that I would contact [employer] to see what is needed. He verbalized understanding.   Will send community message to [name] since it is after hours.

## 2019-01-18 ENCOUNTER — Other Ambulatory Visit: Payer: Self-pay | Admitting: Surgery

## 2019-01-18 DIAGNOSIS — Z85118 Personal history of other malignant neoplasm of bronchus and lung: Secondary | ICD-10-CM

## 2019-01-18 NOTE — Telephone Encounter (Signed)
Catron, Neysa Hotter, [place]; Catron, Stanford Breed, [place]; Jonn Shingles L  His account with [employer] state -Called and spoke with patient to inform them of the PAR Expiration 02/03/2019 and to schedule an appt with their DR within the next 45 days for new documentation/testing .PT had a ff-up last 12/31/2018 with Dr. Collene Gobble, MD, [facility] number :[phone]. PT was informed for O2 Par Expiry. Advised we need contact Dr for OV notes AND O2 test results   So he needs an appointment for testing and an office visit discussing the need for the oxygen     Called and spoke to pt. Informed him of the need. Appt made with Eustaquio Maize, NP, on 12/23. Pt verbalized understanding and denied any further questions or concerns at this time.

## 2019-01-26 ENCOUNTER — Encounter: Payer: Self-pay | Admitting: Primary Care

## 2019-01-26 ENCOUNTER — Ambulatory Visit: Payer: Medicaid Other | Admitting: Primary Care

## 2019-01-26 ENCOUNTER — Other Ambulatory Visit: Payer: Self-pay

## 2019-01-26 VITALS — BP 90/60 | HR 74 | Temp 98.3°F | Ht 67.0 in | Wt 146.8 lb

## 2019-01-26 DIAGNOSIS — G4734 Idiopathic sleep related nonobstructive alveolar hypoventilation: Secondary | ICD-10-CM | POA: Diagnosis not present

## 2019-01-26 DIAGNOSIS — J449 Chronic obstructive pulmonary disease, unspecified: Secondary | ICD-10-CM

## 2019-01-26 DIAGNOSIS — F513 Sleepwalking [somnambulism]: Secondary | ICD-10-CM | POA: Diagnosis not present

## 2019-01-26 MED ORDER — ANORO ELLIPTA 62.5-25 MCG/INH IN AEPB: 1.0000 | INHALATION_SPRAY | Freq: Every day | RESPIRATORY_TRACT | 0 refills | Status: DC

## 2019-01-26 NOTE — Patient Instructions (Addendum)
Recommendations: - Continue Anoro 1 puff daily (sample given) - As needed albuterol 2 puff every 4-6 hours for shortness of breath/wheezing  - Continue to try and quit smoking (congratulations on cutting back and setting up a visit with our pharmacist to help you even further!!)   Refer: - Sleep medicine- re: sleep walking/eating  - Pharmacy re: smoking cessation counseling  Order: - ONO on room air re: nocturnal hypoxemia   Follow-up: - Dr. Lamonte Sakai 6 months or sooner if needed - Due for CT chest with Dr. Cyndia Bent in Jan 2021    Steps to Quit Smoking Smoking tobacco is the leading cause of preventable death. It can affect almost every organ in the body. Smoking puts you and those around you at risk for developing many serious chronic diseases. Quitting smoking can be difficult, but it is one of the best things that you can do for your health. It is never too late to quit. How do I get ready to quit? When you decide to quit smoking, create a plan to help you succeed. Before you quit:  Pick a date to quit. Set a date within the next 2 weeks to give you time to prepare.  Write down the reasons why you are quitting. Keep this list in places where you will see it often.  Tell your family, friends, and co-workers that you are quitting. Support from your loved ones can make quitting easier.  Talk with your health care provider about your options for quitting smoking.  Find out what treatment options are covered by your health insurance.  Identify people, places, things, and activities that make you want to smoke (triggers). Avoid them. What first steps can I take to quit smoking?  Throw away all cigarettes at home, at work, and in your car.  Throw away smoking accessories, such as Scientist, research (medical).  Clean your car. Make sure to empty the ashtray.  Clean your home, including curtains and carpets. What strategies can I use to quit smoking? Talk with your health care provider  about combining strategies, such as taking medicines while you are also receiving in-person counseling. Using these two strategies together makes you more likely to succeed in quitting than if you used either strategy on its own.  If you are pregnant or breastfeeding, talk with your health care provider about finding counseling or other support strategies to quit smoking. Do not take medicine to help you quit smoking unless your health care provider tells you to do so. To quit smoking: Quit right away  Quit smoking completely, instead of gradually reducing how much you smoke over a period of time. Research shows that stopping smoking right away is more successful than gradually quitting.  Attend in-person counseling to help you build problem-solving skills. You are more likely to succeed in quitting if you attend counseling sessions regularly. Even short sessions of 10 minutes can be effective. Take medicine You may take medicines to help you quit smoking. Some medicines require a prescription and some you can purchase over-the-counter. Medicines may have nicotine in them to replace the nicotine in cigarettes. Medicines may:  Help to stop cravings.  Help to relieve withdrawal symptoms. Your health care provider may recommend:  Nicotine patches, gum, or lozenges.  Nicotine inhalers or sprays.  Non-nicotine medicine that is taken by mouth. Find resources Find resources and support systems that can help you to quit smoking and remain smoke-free after you quit. These resources are most helpful when you use them  often. They include:  Online chats with a Social worker.  Telephone quitlines.  Printed Furniture conservator/restorer.  Support groups or group counseling.  Text messaging programs.  Mobile phone apps or applications. Use apps that can help you stick to your quit plan by providing reminders, tips, and encouragement. There are many free apps for mobile devices as well as websites. Examples  include Quit Guide from the [employer] and smokefree.gov What things can I do to make it easier to quit?   Reach out to your family and friends for support and encouragement. Call telephone quitlines (1-800-QUIT-NOW), reach out to support groups, or work with a counselor for support.  Ask people who smoke to avoid smoking around you.  Avoid places that trigger you to smoke, such as bars, parties, or smoke-break areas at work.  Spend time with people who do not smoke.  Lessen the stress in your life. Stress can be a smoking trigger for some people. To lessen stress, try: ? Exercising regularly. ? Doing deep-breathing exercises. ? Doing yoga. ? Meditating. ? Performing a body scan. This involves closing your eyes, scanning your body from head to toe, and noticing which parts of your body are particularly tense. Try to relax the muscles in those areas. How will I feel when I quit smoking? Day 1 to 3 weeks Within the first 24 hours of quitting smoking, you may start to feel withdrawal symptoms. These symptoms are usually most noticeable 2-3 days after quitting, but they usually do not last for more than 2-3 weeks. You may experience these symptoms:  Mood swings.  Restlessness, anxiety, or irritability.  Trouble concentrating.  Dizziness.  Strong cravings for sugary foods and nicotine.  Mild weight gain.  Constipation.  Nausea.  Coughing or a sore throat.  Changes in how the medicines that you take for unrelated issues work in your body.  Depression.  Trouble sleeping (insomnia). Week 3 and afterward After the first 2-3 weeks of quitting, you may start to notice more positive results, such as:  Improved sense of smell and taste.  Decreased coughing and sore throat.  Slower heart rate.  Lower blood pressure.  Clearer skin.  The ability to breathe more easily.  Fewer sick days. Quitting smoking can be very challenging. Do not get discouraged if you are not successful  the first time. Some people need to make many attempts to quit before they achieve long-term success. Do your best to stick to your quit plan, and talk with your health care provider if you have any questions or concerns. Summary  Smoking tobacco is the leading cause of preventable death. Quitting smoking is one of the best things that you can do for your health.  When you decide to quit smoking, create a plan to help you succeed.  Quit smoking right away, not slowly over a period of time.  When you start quitting, seek help from your health care provider, family, or friends. This information is not intended to replace advice given to you by your health care provider. Make sure you discuss any questions you have with your health care provider. Document Released: 01/14/2001 Document Revised: 04/09/2018 Document Reviewed: 04/10/2018 Elsevier Patient Education  2020 [employer].
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
## 2019-01-27 DIAGNOSIS — G4734 Idiopathic sleep related nonobstructive alveolar hypoventilation: Secondary | ICD-10-CM | POA: Insufficient documentation

## 2019-01-27 DIAGNOSIS — J9611 Chronic respiratory failure with hypoxia: Secondary | ICD-10-CM | POA: Insufficient documentation

## 2019-01-27 DIAGNOSIS — F513 Sleepwalking [somnambulism]: Secondary | ICD-10-CM | POA: Insufficient documentation

## 2019-01-27 NOTE — Assessment & Plan Note (Signed)
-   Refer to sleep specialist

## 2019-01-27 NOTE — Assessment & Plan Note (Addendum)
>>  ASSESSMENT AND PLAN FOR COPD WITH EMPHYSEMA (HCC) WRITTEN ON 01/27/2019 12:49 PM BY Danicka Hourihan, Earnstine Regal, NP  - Continue Anoro Ellipta 1 puff daily (sample given) - Use albuterol hfa 2 puff every 4-6 hours for shortness of breath/wheezing  - Ambulatory O2 with no documented desaturation - Strongly encouraged smoking cessation, referred to pharmacist on site for additional education  - Dr. Delton Coombes 6 months or sooner if needed  >>ASSESSMENT AND PLAN FOR CHRONIC RESPIRATORY FAILURE WITH HYPOXIA (HCC) WRITTEN ON 09/19/2022  8:12 AM BY DEAN, EMILY, DO  >>ASSESSMENT AND PLAN FOR NOCTURNAL HYPOXEMIA WRITTEN ON 01/27/2019 12:46 PM BY Osamah Schmader, Earnstine Regal, NP  - Needs ONO on RA - Continue 2L [place] at bedtime

## 2019-01-27 NOTE — Assessment & Plan Note (Signed)
-   Due for CT chest with Dr. Cyndia Bent in Jan 2021

## 2019-01-27 NOTE — Assessment & Plan Note (Signed)
-   Needs ONO on RA - Continue 2L [place] at bedtime

## 2019-01-27 NOTE — Assessment & Plan Note (Signed)
>>  ASSESSMENT AND PLAN FOR NOCTURNAL HYPOXEMIA WRITTEN ON 01/27/2019 12:46 PM BY WALSH, Earnstine Regal, NP  - Needs ONO on RA - Continue 2L [place] at bedtime

## 2019-01-30 NOTE — Progress Notes (Signed)
Subjective Patient presents to [facility] and seen by the pharmacist for smoking cessation counseling.   Patient was referred and last seen by  Geraldo Pitter, NP, on 01/26/2019.PMH significant for COPD, adenocarcinoma of the lung s/p [name] resection and RUL nodule resection, LPR, allergic rhinitis, CAD, GERD, chronic headaches, cervical myelopathy, irregular heart rhythm, and hx of cocaine and alcohol abuse.   Patient has Medicaid insurance, therefore, Chantix, bupropion, and nicotine replacement therapy are all covered for a $3 copay for 30-day supply. Medicaid prefers Rugby brand.   Patient contacted today via telephone for initial appt with pharmacy team. He has never tried bupropion. He smokes in his apartment. He does not have a car, but he reports smoking in his friend's car. Patient states there are people he surrounds himself with that smoke and people in his building smoke.   Social History   Tobacco Use  Smoking Status Current Every Day Smoker  . Packs/day: 1.00  . Years: 56.00  . Pack years: 56.00  . Types: Cigarettes  . Start date: 1974  Smokeless Tobacco Never Used  Tobacco Comment   currently smoking 10 cigs daily 01/31/2019     Tobacco Use History  Age when started using tobacco on a daily basis 18  Type: cigarettes.  Number of cigarettes per day 10 cigarettes, brand [employer] first cigarette 15-20 minutes after waking.  Does not wake at night to smoke  Triggers include cup of coffee in the morning, stress, meals, walking around [employer]  Quit Attempt History   Most recent quit attempt: 1-2 months ago  Longest time ever been tobacco free 2 years ("cold [place]")  Methods tried in the past include  Nicotine patch - lacked efficacy  Nicotine gum - wears dentures, finds it challenging to use  Nicotine lozenge - doesn't taste good  Chantix - felt jittery and that the walls were closing in on him  Rates IMPORTANCE of quitting tobacco on  1-10 scale of 8 or 9  Rates READINESS of quitting tobacco on 1-10 scale of 8 or 9  Rates CONFIDENCE of quitting tobacco on 1-10 scale of 7 or 8  Motivators to quitting include health, loved ones; barriers include life stressors (recently got out of a toxic relationship)  Fagerstrom Score Question Scoring Patient Score  How soon after waking do you smoke your first cigarette? <5 mins (3) 5-30 mins (2) 31-60 mins (1) >60 mins (0) 2  Do you find it difficult NOT to smoke in places where you shouldn't? Yes(1) No (0) 1  Which cigarette would you most hate to give up? First one in AM (1)  Any other one (0) 1     How many cigarettes do you smoke/day? 10 or less (0) 11-20 (1) 21-30 (2) >30 (3) 0  Do you smoke more during the first few hours after waking? Yes (1) No (0) 0 Smokes consistently throughout day  Do you smoke if you are so ill you cannot get out of bed? Yes (1) No (0) 0   Total Score   Score interpretation: low 1-2, low-to-moderate 3-4, moderate 5-7, high >7  Total Score: 4  Immunization History  Administered Date(s) Administered  . Influenza Whole 10/05/2010  . Influenza,inj,Quad PF,6+ Mos 11/28/2018  . Pneumococcal-Unspecified 11/28/2018     Assessment and Plan  1. Smoking Cessation -Initiated nicotine replacement tx with bupropion and nicotine patch. Patient counseled on purpose, proper use, and potential adverse effects. Patient verbalized understanding.  The NDCs for the  nicotine patch 21 mg (Rugby brand) are listed as follows: [id number], [id number], [id number], and/or [id number]. Wrote specific NDCs on note to pharmacy for prescription.   Nicotine Patch Patient counseled on purpose, proper use, and potential adverse effects, including mild itching or redness at the point of application, headache, trouble sleeping, and/or vivid dreams. Patient verbalized understanding.  Patch Schedule for >10 cigarettes daily -Weeks 1-6: one 21 mg patch daily -Weeks  7-8: one 14 mg patch daily -Weeks 9-10: one 7 mg patch daily  Bupropion -Initiated bupropion XL 150 mg daily. After 3 days, plan for patient to increase to bupropion XL 300 mg daily. Patient with no PMH of seizures. Patient counseled on purpose, proper use, and potential adverse effects, including insomnia, and potential change in mood. Patient verbalized understanding.  Nonpharmacologic  -Pursuing his GED, listening to music, watching movies, playing video games, sugar-free gum, cherry/strawberry candy, cinnamon candy  -Provided information on 1 800-QUIT NOW support program.   -Follow up in 1 week. Will assess tolerance to smoking cessation meds and re-assess willingness to set quit date.  2. Medication Reconciliation A drug regimen assessment was performed, including review of allergies, interactions, disease-state management, dosing and immunization history. Medications were reviewed with the patient, including name, instructions, indication, goals of therapy, potential side effects, importance of adherence, and safe use. Added -Ibuprofen   3. Immunizations Patient is indicated for shingles vaccination. Patient verbalized understanding and states he will get his Shingles vaccine when he picks up his smoking cessation medications.   Thank you for involving pharmacy to assist in providing Mr. Bougie's care.   Drexel Iha, PharmD PGY2 Ambulatory Care Pharmacy Resident

## 2019-01-31 ENCOUNTER — Ambulatory Visit (INDEPENDENT_AMBULATORY_CARE_PROVIDER_SITE_OTHER): Payer: Medicaid Other | Admitting: Pharmacist

## 2019-01-31 ENCOUNTER — Telehealth: Payer: Self-pay | Admitting: Pharmacist

## 2019-01-31 DIAGNOSIS — F17210 Nicotine dependence, cigarettes, uncomplicated: Secondary | ICD-10-CM

## 2019-01-31 MED ORDER — BUPROPION HCL ER (XL) 150 MG PO TB24: 150.0000 mg | ORAL_TABLET | Freq: Every day | ORAL | 11 refills | Status: DC

## 2019-01-31 MED ORDER — BUPROPION HCL ER (XL) 150 MG PO TB24: 150.0000 mg | ORAL_TABLET | Freq: Every day | ORAL | 0 refills | Status: DC

## 2019-01-31 MED ORDER — BUPROPION HCL ER (XL) 300 MG PO TB24: 300.0000 mg | ORAL_TABLET | Freq: Every day | ORAL | 11 refills | Status: DC

## 2019-01-31 MED ORDER — NICOTINE 21 MG/24HR TD PT24: 21.0000 mg | MEDICATED_PATCH | Freq: Every day | TRANSDERMAL | 11 refills | Status: DC

## 2019-01-31 MED ORDER — BUPROPION HCL ER (XL) 300 MG PO TB24: 300.0000 mg | ORAL_TABLET | Freq: Every day | ORAL | 0 refills | Status: DC

## 2019-01-31 NOTE — Telephone Encounter (Signed)
[place] to ensure medication was covered via insurance. Pharmacy staff member stated that Medicaid requires two prescriptions (1st one - bupropion XL 150 mg daily x3 days, 2nd ones - bupropion XL 300 mg daily x 27 days) for 1 month supply of bupropion titration schedule. Re-wrote bupropion prescription and sent back into [facility]. [place] staff member confirmed that bupropion and nicotine patch both were covered by insurance and would be a $3 copay for each.  Called patient to inform him that his smoking cessation prescriptions were available for pick up. Patient verbalized understanding and expressed appreciation for the call.  Drexel Iha, PharmD PGY2 Ambulatory Care Pharmacy Resident

## 2019-01-31 NOTE — Addendum Note (Signed)
Addended by: Ellwood Handler on: 01/31/2019 05:02 PM   Modules accepted: Orders

## 2019-02-01 ENCOUNTER — Other Ambulatory Visit: Payer: Self-pay | Admitting: Urology

## 2019-02-01 ENCOUNTER — Ambulatory Visit
Admission: RE | Admit: 2019-02-01 | Discharge: 2019-02-01 | Disposition: A | Discharge status: Home / Self Care | Payer: Medicaid Other | Source: Ambulatory Visit | Attending: Urology | Admitting: Urology

## 2019-02-01 ENCOUNTER — Other Ambulatory Visit: Payer: Self-pay

## 2019-02-01 DIAGNOSIS — S30851A Superficial foreign body of abdominal wall, initial encounter: Secondary | ICD-10-CM

## 2019-02-01 DIAGNOSIS — D4102 Neoplasm of uncertain behavior of left kidney: Secondary | ICD-10-CM

## 2019-02-01 LAB — MR ABDOMEN W WO CONTRAST

## 2019-02-01 MED ORDER — GADOBENATE DIMEGLUMINE 529 MG/ML IV SOLN
13.0000 mL | Freq: Once | INTRAVENOUS | Status: AC | PRN
  Administered 2019-02-01: 13 mL via INTRAVENOUS

## 2019-02-07 ENCOUNTER — Telehealth: Payer: Self-pay | Admitting: Pharmacist

## 2019-02-07 NOTE — Telephone Encounter (Signed)
Called patient on 02/07/2019 at 4:24 PM.  Patient states he has picked up the bupropion, but has not picked up nicotine patches. He was planning on quitting smoking and start bupropion today. However, he did not go through with it because he had a stressful evening yesterday. He states he will pick up patches by tomorrow then start bupropion + nicotine patch tomorrow. He states he has accumulated a lot of candy over the holidays to use to stop smoking.  Discussed with patient he can use bupropion + nicotine patch together when he is smoking. Explained smoking will increase risk of side effects with nicotine. Discussed how 2020 ATS guidelines recommend to use smoking cessation therapy while smoking to help decrease urge of smoking. Patient verbalized understanding.   Age when started using tobacco on a daily basis: 64 years old Preferred cigarette brand: Newport Red Tobacco use:  10 cigarettes/day (no changes) Triggers: cup of coffee in the morning, stress, meals, walking around [employer]  Current smoking cessation agents: none Prior smoking cessation agents:  ? Nicotine patch - lacked efficacy ? Nicotine gum - wears dentures, finds it challenging to use ? Nicotine lozenge - doesn't taste good ? Chantix - felt jittery and that the walls were closing in on him Non-pharmacologic methods: Pursuing his GED, listening to music, watching movies, playing video games, sugar-free gum, cherry/strawberry candy, cinnamon candy  Motivation to quit: health, loved ones Goal: Start using bupropion and nicotine patch -- patient is hoping he will smoke less next follow up call.  Plan: Start using bupropion and nicotine patch. Determine quit date after determining how patient tolerates bupropion/nicotine patch.  Follow up: 02/14/2019

## 2019-02-11 ENCOUNTER — Institutional Professional Consult (permissible substitution): Payer: Medicaid Other | Admitting: Pulmonary Disease

## 2019-02-14 ENCOUNTER — Other Ambulatory Visit: Payer: Self-pay | Admitting: Emergency Medicine

## 2019-02-15 ENCOUNTER — Telehealth: Payer: Self-pay | Admitting: Pharmacist

## 2019-02-15 ENCOUNTER — Telehealth: Payer: Self-pay | Admitting: Primary Care

## 2019-02-15 DIAGNOSIS — G4734 Idiopathic sleep related nonobstructive alveolar hypoventilation: Secondary | ICD-10-CM

## 2019-02-15 NOTE — Telephone Encounter (Signed)
Patient re qualified for 2L nocturnal oxygen.   02/07/19 ONO on RA showed patient spent 2 hours 54 mins oxygen level <88%; spO2 low 81%; baseline O2 89%

## 2019-02-15 NOTE — Telephone Encounter (Signed)
Called patient on 02/15/2019 at 2:43 PM and left HIPAA-compliant VM with instructions to call [facility] back   Plan to discuss smoking cessation  Drexel Iha, PharmD PGY2 Ambulatory Care Pharmacy Resident

## 2019-02-16 NOTE — Telephone Encounter (Signed)
Oxygen ordered.

## 2019-02-17 ENCOUNTER — Telehealth: Payer: Self-pay | Admitting: Primary Care

## 2019-02-17 NOTE — Telephone Encounter (Signed)
[name] with [employer] returned call. [name] wanted to know if pt was only to use the O2 at night now as she said he has been on daytime O2 for years.  Beth, please advise on this. Thanks!

## 2019-02-17 NOTE — Telephone Encounter (Signed)
Spoke with the pt  He states that he spoke with [employer] and was advised that they would be picking up his old concentrator and bringing him a new one that "does something different"  He wants to know what the difference is and is wondering why this was not explained to him  I called and LMTCB for Melissa to find out

## 2019-02-17 NOTE — Telephone Encounter (Signed)
Pt returned call. Informed pt of the recs per [facility]. Pt states since he had a recent walk on 12/23 and did 3 laps and did not desat. Pt states his breathing is unchanged since then and doesn't feel he needs to do another walk test. Pt is aware getting a pulse/ox for home use would be beneficial. Pt aware to call back if his breathing were to change and feels he may need O2 during the day. Currently, pt does not wear O2 during the day.  Called and spoke to [facility] with [employer] and was advised he is only in their system as using nocturnal O2. Pt aware to wear 2 lpm O2 at night.   Will forward to [facility] as FYI.

## 2019-02-17 NOTE — Telephone Encounter (Signed)
Thank you :)

## 2019-02-17 NOTE — Telephone Encounter (Signed)
He had ambulatory O2 walk in office where there was not documented desaturation. Can you call patient and see if he has been wearing his oxygen during the day or feels he needs it during the day- if so I would scheduled him for a 6 min walk. He needs to wear 2L oxygen at night.

## 2019-02-17 NOTE — Telephone Encounter (Signed)
Called [employer] to speak with [name] but unable to reach her. Left message for [name] to return call.

## 2019-02-17 NOTE — Telephone Encounter (Signed)
LMTCB

## 2019-02-17 NOTE — Telephone Encounter (Signed)
Pt has questions regarding what new machine that is coming does.  States no explanation from the Dr.  Please advise.  [phone]

## 2019-02-17 NOTE — Telephone Encounter (Signed)
[name] with [employer] is asking if pt needs 2 liters of oxygen at night.  Please advise.  [phone]

## 2019-02-18 NOTE — Telephone Encounter (Signed)
No call back from [place] so I have sent a community msg

## 2019-02-18 NOTE — Telephone Encounter (Signed)
Melissa returning call from [place].  [phone]

## 2019-02-18 NOTE — Telephone Encounter (Signed)
Spoke with Melissa  They are picking up his homefill system since he no longer uses the daytime o2 and supplying him with a stationary concentrator  That is all he needs at this time  I spoke with the pt and explained this and he verbalized understanding  Nothing further needed

## 2019-02-21 ENCOUNTER — Telehealth: Payer: Self-pay | Admitting: Pharmacist

## 2019-02-21 NOTE — Telephone Encounter (Signed)
Called patient on 02/21/2019 at 9:56 AM.  Patient states he received recent cancer diagnosis last Thursday (02/17/2019). Since then he has increased efforts to quit smoking (decreased from smoking 10 cigs/day to 5 cigs/day). Encouraged patient for significant improvement! Offered patient to look into mental health support if he is interested - patient politely declined.   Age when started using tobacco on a daily basis: 64 years old Preferred cigarette brand: Newport Red Tobacco use:  5 cigarettes/day (decreased by 1/2 in 1 week) Triggers: cup of coffee in the morning, stress, meals, walking around [place]  Current smoking cessation agents: nicotine 21 mg daily (02/17/2019), bupropion XL 300 mg daily Prior smoking cessation agents: ? Nicotine patch - lacked efficacy ? Nicotine gum - wears dentures, finds it challenging to use ? Nicotine lozenge - doesn't taste good ? Chantix - felt jittery and that the walls were closing in on him  Non-pharmacologic methods: green peppermint  -Options he is open to: pursuing his GED, listening to music, watching movies, playing video games, sugar-free gum, cherry/strawberry candy, cinnamon candy  Motivation to quit: health (recent cancer diagnosis), health, loved ones Goal: "smoke less"  Plan:  1. Continue using nicotine 21 mg daily (02/17/2019), bupropion XL 300 mg daily, green peppermint prn 2.Patient interested in talking to [place] - provided him with phone number and mailed smoking cessation education/resources  Follow up: 02/28/2019

## 2019-02-22 ENCOUNTER — Other Ambulatory Visit: Payer: Self-pay

## 2019-02-22 ENCOUNTER — Other Ambulatory Visit (HOSPITAL_COMMUNITY): Payer: Self-pay | Admitting: Urology

## 2019-02-22 ENCOUNTER — Ambulatory Visit (HOSPITAL_COMMUNITY)
Admission: RE | Admit: 2019-02-22 | Discharge: 2019-02-22 | Disposition: A | Discharge status: Home / Self Care | Payer: Medicaid Other | Source: Ambulatory Visit | Attending: Urology | Admitting: Urology

## 2019-02-22 DIAGNOSIS — D49512 Neoplasm of unspecified behavior of left kidney: Secondary | ICD-10-CM | POA: Diagnosis not present

## 2019-02-23 ENCOUNTER — Ambulatory Visit: Payer: Medicaid Other | Admitting: Pulmonary Disease

## 2019-02-23 ENCOUNTER — Encounter: Payer: Self-pay | Admitting: Pulmonary Disease

## 2019-02-23 VITALS — BP 128/76 | HR 77 | Temp 97.6°F | Ht 67.0 in | Wt 146.6 lb

## 2019-02-23 DIAGNOSIS — G4700 Insomnia, unspecified: Secondary | ICD-10-CM

## 2019-02-23 DIAGNOSIS — J449 Chronic obstructive pulmonary disease, unspecified: Secondary | ICD-10-CM

## 2019-02-23 NOTE — Patient Instructions (Signed)
Sleep maintenance insomnia  Try and consolidate your sleep as best as you can You only need about 6 to 8 hours of sleep at most  Try and set you wake up time, make sure you are getting about 6 to 8 hours in total  Try not to get too active in the middle of the night  Continue to cut down smoking  Avoid caffeinated beverages late in the evening  I will see you in about 4 to 6 weeks  Call with any significant concerns
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
## 2019-02-25 ENCOUNTER — Telehealth: Payer: Self-pay | Admitting: Pulmonary Disease

## 2019-02-25 NOTE — Telephone Encounter (Signed)
Will have to call 1/25 since the office is closed now

## 2019-02-28 ENCOUNTER — Telehealth: Payer: Self-pay | Admitting: Pharmacist

## 2019-02-28 NOTE — Telephone Encounter (Signed)
Called patient on 02/28/2019 at 10:01 AM and left HIPAA-compliant VM with instructions to call [place] [facility] [facility] back   Plan to discuss smoking cessation  Drexel Iha, PharmD PGY2 Ambulatory Care Pharmacy Resident

## 2019-03-01 ENCOUNTER — Other Ambulatory Visit: Payer: Self-pay | Admitting: Surgery

## 2019-03-01 NOTE — Telephone Encounter (Signed)
Spoke with [name]  She states needing pulmonary clearance for general anesthesia for partial left nephrectomy  Dr Ander Slade, please advise if you are okay with writing a letter for this or if we need to have pt come back in for another visit  Thanks

## 2019-03-02 ENCOUNTER — Ambulatory Visit: Payer: Medicaid Other | Admitting: Surgery

## 2019-03-02 ENCOUNTER — Encounter: Payer: Self-pay | Admitting: *Deleted

## 2019-03-02 ENCOUNTER — Other Ambulatory Visit: Payer: Medicaid Other

## 2019-03-02 ENCOUNTER — Other Ambulatory Visit: Payer: Self-pay

## 2019-03-02 ENCOUNTER — Encounter: Payer: Self-pay | Admitting: Surgery

## 2019-03-02 VITALS — BP 116/78 | HR 79 | Temp 97.7°F | Resp 20 | Ht 67.0 in | Wt 145.0 lb

## 2019-03-02 DIAGNOSIS — Z902 Acquired absence of lung [part of]: Secondary | ICD-10-CM

## 2019-03-02 DIAGNOSIS — Z85118 Personal history of other malignant neoplasm of bronchus and lung: Secondary | ICD-10-CM | POA: Diagnosis not present

## 2019-03-02 NOTE — Telephone Encounter (Signed)
Spoke with [name]  Letter done and faxed to her at [phone]  Nothing further needed

## 2019-03-02 NOTE — Telephone Encounter (Signed)
He is clear for surgery as long as not having any specific respiratory complaints at present

## 2019-03-02 NOTE — Telephone Encounter (Signed)
[phone] ext [phone] connie returning call.Hillery Hunter

## 2019-03-02 NOTE — Telephone Encounter (Signed)
ATC Conie at [facility] scheduling, unable to reach, left message to call back

## 2019-03-02 NOTE — Progress Notes (Signed)
HPI:  Patient returns for routinesurveillancehaving undergoneRight VATS with wedge resection of a 1.4 cm RUL lung noduleon 07/03/2016. The final pathology showed a poorly differentiated adenocarcinoma with negative surgical margins.This is his second lung cancer resection having undergoneleft upper lobectomy on 06/12/2011 for a stage IB (T2A, N0, M0) non-small cell lung cancer consistent with adenocarcinoma. This was a 2.0 cm tumor with visceral pleural invasion. Since I last saw him on 08/25/2018 he has been feeling well.  He continues to smoke the same amount.  He denies any headaches or visual changes.  He has had no muscle or bone pain.  He denies any sputum production or hemoptysis.  He was found to have a small left renal lesion on abdominal ultrasound in September done for right upper quadrant and epigastric pain.  CT scan of the abdomen confirmed a 2.1 x 1.3 cm lesion in the upper aspect of the left kidney concerning for a small renal neoplasm.  He subsequently underwent an MRI of the abdomen which confirmed a 2.5 cm mass in the upper pole left kidney suspicious for renal cell carcinoma.  He said that he has been seen by Dr. Gilford Rile and plans are being made for resection in a few months.  Current Outpatient Medications  Medication Sig Dispense Refill  . acetaminophen (TYLENOL) 500 MG tablet Take 500 mg by mouth every 6 (six) hours as needed for mild pain or headache.     . albuterol (PROVENTIL HFA) 108 (90 Base) MCG/ACT inhaler INHALE 2 PUFFS BY MOUTH EVERY 6 HOURS AS NEEDED FOR WHEEZING OR SHORTNESS OF BREATH 18 g 3  . aspirin EC 81 MG tablet Take 81 mg by mouth at bedtime.    . Azelastine HCl 137 MCG/SPRAY SOLN USE 2 SPRAY(S) IN EACH NOSTRIL TWICE DAILY AS NEEDED FOR RHINITIS 30 mL 2  . [START ON 03/03/2019] buPROPion (WELLBUTRIN XL) 300 MG 24 hr tablet Take 1 tablet (300 mg total) by mouth daily. 30 tablet 11  . Carboxymethylcellulose Sod PF (REFRESH PLUS) 0.5 % SOLN Place 2 drops  into both eyes daily.    [name] gabapentin (NEURONTIN) 100 MG capsule Take 100 mg by mouth 2 (two) times daily.    [name] ibuprofen (ADVIL) 200 MG tablet Take 200 mg by mouth every 6 (six) hours as needed.    . nicotine (NICODERM CQ - DOSED IN MG/24 HOURS) 21 mg/24hr patch Place 1 patch (21 mg total) onto the skin daily. 28 patch 11  . omeprazole (PRILOSEC) 40 MG capsule Take 1 capsule by mouth once daily 30 capsule 2  . OXYGEN Inhale 2 L into the lungs at bedtime.    [name] umeclidinium-vilanterol (ANORO ELLIPTA) 62.5-25 MCG/INH AEPB Inhale 1 puff by mouth once daily 90 each 3   No current facility-administered medications for this visit.     Physical Exam: BP 116/78   Pulse 79   Temp 97.7 F (36.5 C) (Skin)   Resp 20   Ht 5\' 7"  (1.702 m)   Wt 145 lb (65.8 kg)   SpO2 90% Comment: RA  BMI 22.71 kg/m  He looks well. There is no cervical or supraclavicular adenopathy. Lungs are clear. The chest scars look fine. Cardiac exam shows a regular rate and rhythm with normal heart sounds.  Diagnostic Tests:  CLINICAL DATA:  Renal neoplasm.  EXAM: CHEST - 2 VIEW  COMPARISON:  March 02, 2018  FINDINGS: There is no evidence of acute infiltrate, pleural effusion or pneumothorax.  The heart size and  mediastinal contours are within normal limits.  Radiopaque surgical clips are seen overlying the suprahilar region on the left.  A radiopaque fusion plate and screws are seen overlying the lower cervical spine.  The visualized skeletal structures are otherwise unremarkable.  IMPRESSION: 1. Stable postoperative changes consistent with history of prior left upper lobectomy. 2. No acute or active cardiopulmonary disease. 3. Postoperative changes within the cervical spine.   Electronically Signed   By: Virgina Norfolk M.D.   On: 02/22/2019 23:17  Impression:  There is no evidence of recurrent or metastatic lung cancer on chest x-ray from 02/22/2019.  I had planned on doing a  follow-up CT scan prior to this visit but that was refused by his insurance provider.  I do not see any lesions on his chest x-ray so I think it would be reasonable to repeat his CT scan in 6 more months which will be 1 year following his previous CT scan.  I reviewed his chest x-ray image with him and answered his questions.  I encouraged him again to abstain from smoking.  Plan:  He is going to follow-up with Dr. Gilford Rile concerning his left renal mass.  I will see him back in 6 months with a CT scan of the chest without contrast for lung cancer surveillance.  I spent 20 minutes performing this established patient evaluation and > 50% of this time was spent face to face counseling and coordinating the surveillance of this patient's lung cancer.    Gaye Pollack, MD [facility] [phone]

## 2019-03-07 ENCOUNTER — Telehealth: Payer: Self-pay | Admitting: Pharmacist

## 2019-03-07 ENCOUNTER — Other Ambulatory Visit: Payer: Self-pay | Admitting: Urology

## 2019-03-07 NOTE — Telephone Encounter (Signed)
Called patient on 03/07/2019 at 11:52 AM.  His birthday was 02/28/2019 and he had a nice birthday. Patient states he decided on his birthday he wanted to completely quit smoking by 03/31/2019. Encouraged patient for his motivation!  Patient states he has decreased cigarette use from 5 cigarettes/day to 4 cigarettes/day. Congratulated patient for his efforts!!! Patient states he still remains motivated to quit smoking. He remains tolerating nicotine patch and bupropion. He is content with his pharmacologic and non-pharmacologic methods for smoking cessation at the current moment and would prefer to continue his methods.   Age when started using tobacco on a daily basis:64 years old Preferred cigarette brand:[place] Tobacco use:4 cigarettes/day (decreased by 1 since last phone call on 02/21/2019) Triggers:cup of coffee in the morning, stress, meals, walking around [place]  Current smoking cessation agents: nicotine 21 mg daily (started 02/17/2019), bupropion XL 300 mg daily Prior smoking cessation agents: ? Nicotine patch - lacked efficacy ? Nicotine gum - wears dentures, finds it challenging to use ? Nicotine lozenge - doesn't taste good ? Chantix - felt jittery and that the walls were closing in on him  Non-pharmacologic methods: green peppermint  -Options he is open to: pursuing his GED, listening to music, watching movies, playing video games, sugar-free gum, cherry/strawberry candy, cinnamon candy  Motivation to quit: health (recent cancer diagnosis), health, loved ones Goal: 3 cigarettes/day  Plan:  1. Continue using nicotine 21 mg daily (started 02/17/2019), bupropion XL 300 mg daily, green peppermint prn. Reminded patient he has refills on both of these prescriptions.  Follow up: 03/14/2019

## 2019-03-14 ENCOUNTER — Telehealth: Payer: Self-pay | Admitting: Pharmacist

## 2019-03-14 NOTE — Telephone Encounter (Signed)
Called patient on 03/14/2019 at 3:31 PM   Patient unable to talk at the moment. Will call him back in 1 week (03/21/2019)  Drexel Iha, PharmD PGY2 Ambulatory Care Pharmacy Resident

## 2019-03-21 ENCOUNTER — Telehealth: Payer: Self-pay | Admitting: Pharmacist

## 2019-03-21 NOTE — Telephone Encounter (Signed)
Called patient on 03/21/2019 at 10:50 AM and left HIPAA-compliant VM with instructions to call [facility] back   Plan to discuss smoking cessation. Patient is planning on quitting smoking on 03/31/2019. Will attempt to call patient in 2 weeks to assess his success with smoking cessation.  Drexel Iha, PharmD PGY2 Ambulatory Care Pharmacy Resident

## 2019-03-29 ENCOUNTER — Other Ambulatory Visit: Payer: Self-pay

## 2019-03-29 ENCOUNTER — Ambulatory Visit: Payer: Medicaid Other | Admitting: Pulmonary Disease

## 2019-03-29 ENCOUNTER — Encounter: Payer: Self-pay | Admitting: Pulmonary Disease

## 2019-03-29 VITALS — BP 100/68 | HR 58 | Temp 97.4°F | Ht 68.0 in | Wt 147.4 lb

## 2019-03-29 DIAGNOSIS — Z720 Tobacco use: Secondary | ICD-10-CM | POA: Diagnosis not present

## 2019-03-29 DIAGNOSIS — G4700 Insomnia, unspecified: Secondary | ICD-10-CM

## 2019-03-29 MED ORDER — ESZOPICLONE 1 MG PO TABS: 1.0000 mg | ORAL_TABLET | Freq: Every evening | ORAL | 0 refills | Status: DC | PRN
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
## 2019-03-29 NOTE — Patient Instructions (Signed)
Nonrestorative sleep Nonconsolidated sleep  We will try Lunesta 1 mg nightly You can go to 2 mg if 1 mg is not working as well for you  Try and get at least 6 to 8 hours of sleep a night  Call with any significant concerns  Stop the pills if you feel its not working well for you  I will see you in about 6 weeks

## 2019-04-01 ENCOUNTER — Telehealth: Payer: Self-pay

## 2019-04-01 NOTE — Telephone Encounter (Signed)
Dr.Olalere I spoke with [facility] patient needs to try and fail Ambien any dose, Temazapan 15mg  or 30mg  , Flurazepam capsule any dose before medicaid will approve his lunesta 1mg . Dr Ander Slade can you please advise.Thank you

## 2019-04-01 NOTE — Telephone Encounter (Signed)
PA request was received from :[facility]  [phone] [phone] Medication name and strength: Lunesta 1mg   Ordering Provider: Dr.Olalere   Was PA started with CMM?:  If yes, please enter KEY: Medication tried and failed: Covered Alternatives:Ambien any dose, Temazepam 15mg  or 30mg  Capsule, Flurazepam capsule   PA sent to plan, time frame for approval / denial:  Routing to  for follow-up

## 2019-04-04 ENCOUNTER — Other Ambulatory Visit: Payer: Self-pay | Admitting: Pulmonary Disease

## 2019-04-04 ENCOUNTER — Telehealth: Payer: Self-pay | Admitting: Pharmacist

## 2019-04-04 MED ORDER — TEMAZEPAM 30 MG PO CAPS: 30.0000 mg | ORAL_CAPSULE | Freq: Every evening | ORAL | 0 refills | Status: DC | PRN

## 2019-04-04 NOTE — Telephone Encounter (Signed)
Called patient on 04/04/2019 at 10:09 AM and left HIPAA-compliant VM with instructions to call [facility] back   Plan to discuss smoking cessation  Thank you for involving pharmacy to assist in providing this patient's care.   Drexel Iha, PharmD PGY2 Ambulatory Care Pharmacy Resident

## 2019-04-04 NOTE — Telephone Encounter (Signed)
lmtcb X1 to make pt aware of rx change

## 2019-04-04 NOTE — Progress Notes (Unsigned)
Prescription for temazepam sent in

## 2019-04-04 NOTE — Telephone Encounter (Signed)
Temazepam 30 mg sent in

## 2019-04-06 ENCOUNTER — Other Ambulatory Visit: Payer: Self-pay

## 2019-04-06 NOTE — Telephone Encounter (Signed)
Spoke with patient to let him know of the medication change toTemazepam 30mg  .Patient voice was understanding. Nothing else further needed.

## 2019-04-08 ENCOUNTER — Telehealth: Payer: Self-pay | Admitting: Pharmacist

## 2019-04-08 NOTE — Telephone Encounter (Signed)
Called patient on 04/08/2019 around 10:00 AM. Patient was unable to talk at the moment. Patient asked to be called after 4PM. Called patient back at 4:45PM. Patient was unable to answer the phone. Will attempt to contact patient again on Monday (04/11/2019)  Thank you for involving pharmacy to assist in providing this patient's care.   Drexel Iha, PharmD PGY2 Ambulatory Care Pharmacy Resident

## 2019-04-11 ENCOUNTER — Telehealth: Payer: Self-pay | Admitting: Pharmacist

## 2019-04-11 NOTE — Telephone Encounter (Signed)
Called patient on 04/11/2019 at 11:16 AM and left HIPAA-compliant VM with instructions to call [facility] clinic back   Fifth unsuccessful attempt to reach patient over the past month. Will await for patient to return call for further smoking cessation management.   Thank you for involving pharmacy to assist in providing this patient's care.   Drexel Iha, PharmD PGY2 Ambulatory Care Pharmacy Resident

## 2019-05-12 ENCOUNTER — Telehealth: Payer: Self-pay | Admitting: Emergency Medicine

## 2019-05-12 DIAGNOSIS — J449 Chronic obstructive pulmonary disease, unspecified: Secondary | ICD-10-CM

## 2019-05-12 MED ORDER — PREDNISONE 10 MG PO TABS: ORAL_TABLET | ORAL | 0 refills | Status: AC

## 2019-05-12 MED ORDER — AZITHROMYCIN 250 MG PO TABS: ORAL_TABLET | ORAL | 0 refills | Status: DC

## 2019-05-12 NOTE — Telephone Encounter (Signed)
Called spoke with patient.  Let him know Dr. Agustina Caroli recommendations Orders placed Pharmacy verified Nothing further needed at this time

## 2019-05-12 NOTE — Telephone Encounter (Signed)
Spoke with pt. PT c/o increased wheezing at night and in am for past 2 days. Has some increase in SOB , mild chest tightness, occas non prod cough. Denies fever, chills or sweats. Using Albuterol occas but not every day.Pt had 2nd covid vaccine on 05/07/19. Pt states he was wheezing some before the shot it has just increased in the past 2 days. Taking Anoro daily and also taking otc mucus relief DM. Please advise.

## 2019-05-12 NOTE — Telephone Encounter (Signed)
Would offer him rx for an acute exacerbation:   Prednisone >  Take 40mg  daily for 3 days, then 30mg  daily for 3 days, then 20mg  daily for 3 days, then 10mg  daily for 3 days, then stop  Azithromycin > Z pack  If not improving next week then he needs to call.

## 2019-05-18 ENCOUNTER — Telehealth: Payer: Self-pay | Admitting: Emergency Medicine

## 2019-05-18 NOTE — Telephone Encounter (Signed)
Spoke with pt, his oxygen concentrator at home is so loud that his neighbors are complaining. He has tried to reach out to [employer] and they switched it out once before but they brought him another that is even louder than the first one. I sent a message to [facility]. I will await her response. Pt will eventually want to switch DME company.

## 2019-05-19 NOTE — Telephone Encounter (Signed)
As of this morning, there is not a response from [facility] in [place]. Will continue to follow up.

## 2019-05-20 NOTE — Telephone Encounter (Signed)
Still awaiting response from [facility].

## 2019-05-23 NOTE — Telephone Encounter (Signed)
Darlina Guys sent to Jannette Spanner, CMA  Hey!  Sorry I'm just now seeing this message. I'm not able to get into Epic everyday so feel free to call me if you need to. I'm checking on this situation.  Thanks  [employer]

## 2019-05-25 NOTE — Telephone Encounter (Signed)
LMTCB for [name] to check on this

## 2019-05-25 NOTE — Progress Notes (Signed)
PCP - Vassie Moment Cardiologist - Kathlyn [place]  lov 11-09-18 [facility]  Pulmonary- Wyn Quaker ,NP clearance 02-24-19 epic  Chest x-ray - 02-21-18 epic EKG - 11-09-18 epic Stress Test -  ECHO - 11-15-18 epic Cardiac Cath -   Sleep Study -  CPAP -   Fasting Blood Sugar -  Checks Blood Sugar _____ times a day  Blood Thinner Instructions: Aspirin Instructions: Last Dose:  Anesthesia review: O2 2 liters hs, hx lung ca , MI, cad, copd   Patient denies shortness of breath, fever, cough and chest pain at PAT appointment   none   Patient verbalized understanding of instructions that were given to them at the PAT appointment. Patient was also instructed that they will need to review over the PAT instructions again at home before surgery.

## 2019-05-25 NOTE — Patient Instructions (Addendum)
DUE TO COVID-19 ONLY ONE VISITOR IS ALLOWED TO COME WITH YOU AND STAY IN THE WAITING ROOM ONLY DURING PRE OP AND PROCEDURE DAY OF SURGERY. TWO  VISITOR MAY VISIT WITH YOU AFTER SURGERY IN YOUR PRIVATE ROOM DURING VISITING HOURS ONLY!  10a-8p  YOU NEED TO HAVE A COVID 19 TEST ON___4-24-21____ @__10 :00   They close at 12:00_____, THIS TEST MUST BE DONE BEFORE SURGERY, COME  [name], [name] , [name].  ([name]) ONCE YOUR COVID TEST IS COMPLETED, PLEASE BEGIN THE QUARANTINE INSTRUCTIONS AS OUTLINED IN YOUR HANDOUT.                [name]  05/25/2019   Your procedure is scheduled on: 06-01-19   Report to [facility] Main  Entrance   Report to admitting at    1030  AM     Call this number if you have problems the morning of surgery [phone]    Remember: Do not eat food or drink liquids :After Midnight.   BRUSH YOUR TEETH MORNING OF SURGERY AND RINSE YOUR MOUTH OUT, NO CHEWING GUM CANDY OR MINTS.     Take these medicines the morning of surgery with A SIP OF WATER: inhalers and bring with you, flomax, lyrica, omeprazole, eye drops, nasal spray                                 You may not have any metal on your body including hair pins and              piercings  Do not wear jewelry,  lotions, powders or perfumes, deodorant                     Men may shave face and neck.   Do not bring valuables to the hospital. [place].  Contacts, dentures or bridgework may not be worn into surgery.      Patients discharged the day of surgery will not be allowed to drive home. IF YOU ARE HAVING SURGERY AND GOING HOME THE SAME DAY, YOU MUST HAVE AN ADULT TO DRIVE YOU HOME AND BE WITH YOU FOR 24 HOURS. YOU MAY GO HOME BY TAXI OR UBER OR ORTHERWISE, BUT AN ADULT MUST ACCOMPANY YOU HOME AND STAY WITH YOU FOR 24 HOURS.  Name and phone number of your driver:  Special Instructions: N/A               Please read over the following fact sheets you were given: _____________________________________________________________________             [facility] - Preparing for Surgery Before surgery, you can play an important role.  Because skin is not sterile, your skin needs to be as free of germs as possible.  You can reduce the number of germs on your skin by washing with CHG (chlorahexidine gluconate) soap before surgery.  CHG is an antiseptic cleaner which kills germs and bonds with the skin to continue killing germs even after washing. Please DO NOT use if you have an allergy to CHG or antibacterial soaps.  If your skin becomes reddened/irritated stop using the CHG and inform your nurse when you arrive at Short Stay. Do not shave (including legs and underarms) for at least 48 hours prior  to the first CHG shower.  You may shave your face/neck. Please follow these instructions carefully:  1.  Shower with CHG Soap the night before surgery and the  morning of Surgery.  2.  If you choose to wash your hair, wash your hair first as usual with your  normal  shampoo.  3.  After you shampoo, rinse your hair and body thoroughly to remove the  shampoo.                           4.  Use CHG as you would any other liquid soap.  You can apply chg directly  to the skin and wash                       Gently with a scrungie or clean washcloth.  5.  Apply the CHG Soap to your body ONLY FROM THE NECK DOWN.   Do not use on face/ open                           Wound or open sores. Avoid contact with eyes, ears mouth and genitals (private parts).                       Wash face,  Genitals (private parts) with your normal soap.             6.  Wash thoroughly, paying special attention to the area where your surgery  will be performed.  7.  Thoroughly rinse your body with warm water from the neck down.  8.  DO NOT shower/wash with your normal soap after using and rinsing off  the CHG Soap.                9.  Pat  yourself dry with a clean towel.            10.  Wear clean pajamas.            11.  Place clean sheets on your bed the night of your first shower and do not  sleep with pets. Day of Surgery : Do not apply any lotions/deodorants the morning of surgery.  Please wear clean clothes to the hospital/surgery center.  FAILURE TO FOLLOW THESE INSTRUCTIONS MAY RESULT IN THE CANCELLATION OF YOUR SURGERY PATIENT SIGNATURE_________________________________  NURSE SIGNATURE__________________________________  ________________________________________________________________________   [name]  An incentive spirometer is a tool that can help keep your lungs clear and active. This tool measures how well you are filling your lungs with each breath. Taking long deep breaths may help reverse or decrease the chance of developing breathing (pulmonary) problems (especially infection) following:  A long period of time when you are unable to move or be active. BEFORE THE PROCEDURE   If the spirometer includes an indicator to show your best effort, your nurse or respiratory therapist will set it to a desired goal.  If possible, sit up straight or lean slightly forward. Try not to slouch.  Hold the incentive spirometer in an upright position. INSTRUCTIONS FOR USE  1. Sit on the edge of your bed if possible, or sit up as far as you can in bed or on a chair. 2. Hold the incentive spirometer in an upright position. 3. Breathe out normally. 4. Place the mouthpiece in your mouth and seal your lips tightly around it. 5. Breathe in slowly  and as deeply as possible, raising the piston or the ball toward the top of the column. 6. Hold your breath for 3-5 seconds or for as long as possible. Allow the piston or ball to fall to the bottom of the column. 7. Remove the mouthpiece from your mouth and breathe out normally. 8. Rest for a few seconds and repeat Steps 1 through 7 at least 10 times every 1-2 hours  when you are awake. Take your time and take a few normal breaths between deep breaths. 9. The spirometer may include an indicator to show your best effort. Use the indicator as a goal to work toward during each repetition. 10. After each set of 10 deep breaths, practice coughing to be sure your lungs are clear. If you have an incision (the cut made at the time of surgery), support your incision when coughing by placing a pillow or rolled up towels firmly against it. Once you are able to get out of bed, walk around indoors and cough well. You may stop using the incentive spirometer when instructed by your caregiver.  RISKS AND COMPLICATIONS  Take your time so you do not get dizzy or light-headed.  If you are in pain, you may need to take or ask for pain medication before doing incentive spirometry. It is harder to take a deep breath if you are having pain. AFTER USE  Rest and breathe slowly and easily.  It can be helpful to keep track of a log of your progress. Your caregiver can provide you with a simple table to help with this. If you are using the spirometer at home, follow these instructions: [place] IF:   You are having difficultly using the spirometer.  You have trouble using the spirometer as often as instructed.  Your pain medication is not giving enough relief while using the spirometer.  You develop fever of 100.5 F (38.1 C) or higher. SEEK IMMEDIATE MEDICAL CARE IF:   You cough up bloody sputum that had not been present before.  You develop fever of 102 F (38.9 C) or greater.  You develop worsening pain at or near the incision site. MAKE SURE YOU:   Understand these instructions.  Will watch your condition.  Will get help right away if you are not doing well or get worse. Document Released: 06/02/2006 Document Revised: 04/14/2011 Document Reviewed: 08/03/2006 ExitCare Patient Information 2014 ExitCare,  [place].   ________________________________________________________________________  WHAT IS A BLOOD TRANSFUSION? Blood Transfusion Information  A transfusion is the replacement of blood or some of its parts. Blood is made up of multiple cells which provide different functions.  Red blood cells carry oxygen and are used for blood loss replacement.  White blood cells fight against infection.  Platelets control bleeding.  Plasma helps clot blood.  Other blood products are available for specialized needs, such as hemophilia or other clotting disorders. BEFORE THE TRANSFUSION  Who gives blood for transfusions?   Healthy volunteers who are fully evaluated to make sure their blood is safe. This is blood bank blood. Transfusion therapy is the safest it has ever been in the practice of medicine. Before blood is taken from a donor, a complete history is taken to make sure that person has no history of diseases nor engages in risky social behavior (examples are intravenous drug use or sexual activity with multiple partners). The donor's travel history is screened to minimize risk of transmitting infections, such as malaria. The donated blood is tested for signs  of infectious diseases, such as HIV and hepatitis. The blood is then tested to be sure it is compatible with you in order to minimize the chance of a transfusion reaction. If you or a relative donates blood, this is often done in anticipation of surgery and is not appropriate for emergency situations. It takes many days to process the donated blood. RISKS AND COMPLICATIONS Although transfusion therapy is very safe and saves many lives, the main dangers of transfusion include:   Getting an infectious disease.  Developing a transfusion reaction. This is an allergic reaction to something in the blood you were given. Every precaution is taken to prevent this. The decision to have a blood transfusion has been considered carefully by your caregiver  before blood is given. Blood is not given unless the benefits outweigh the risks. AFTER THE TRANSFUSION  Right after receiving a blood transfusion, you will usually feel much better and more energetic. This is especially true if your red blood cells have gotten low (anemic). The transfusion raises the level of the red blood cells which carry oxygen, and this usually causes an energy increase.  The nurse administering the transfusion will monitor you carefully for complications. HOME CARE INSTRUCTIONS  No special instructions are needed after a transfusion. You may find your energy is better. Speak with your caregiver about any limitations on activity for underlying diseases you may have. SEEK MEDICAL CARE IF:   Your condition is not improving after your transfusion.  You develop redness or irritation at the intravenous (IV) site. SEEK IMMEDIATE MEDICAL CARE IF:  Any of the following symptoms occur over the next 12 hours:  Shaking chills.  You have a temperature by mouth above 102 F (38.9 C), not controlled by medicine.  Chest, back, or muscle pain.  People around you feel you are not acting correctly or are confused.  Shortness of breath or difficulty breathing.  Dizziness and fainting.  You get a rash or develop hives.  You have a decrease in urine output.  Your urine turns a dark color or changes to pink, red, or brown. Any of the following symptoms occur over the next 10 days:  You have a temperature by mouth above 102 F (38.9 C), not controlled by medicine.  Shortness of breath.  Weakness after normal activity.  The white part of the eye turns yellow (jaundice).  You have a decrease in the amount of urine or are urinating less often.  Your urine turns a dark color or changes to pink, red, or brown. Document Released: 01/18/2000 Document Revised: 04/14/2011 Document Reviewed: 09/06/2007 [facility] Patient Information 2014 [place],  [place].  _______________________________________________________________________

## 2019-05-26 ENCOUNTER — Encounter (HOSPITAL_COMMUNITY): Payer: Self-pay

## 2019-05-26 ENCOUNTER — Encounter (HOSPITAL_COMMUNITY)
Admission: RE | Admit: 2019-05-26 | Discharge: 2019-05-26 | Disposition: A | Discharge status: Home / Self Care | Payer: Medicaid Other | Source: Ambulatory Visit | Attending: Urology | Admitting: Urology

## 2019-05-26 ENCOUNTER — Other Ambulatory Visit: Payer: Self-pay

## 2019-05-26 DIAGNOSIS — N2889 Other specified disorders of kidney and ureter: Secondary | ICD-10-CM | POA: Insufficient documentation

## 2019-05-26 DIAGNOSIS — Z9981 Dependence on supplemental oxygen: Secondary | ICD-10-CM | POA: Diagnosis not present

## 2019-05-26 DIAGNOSIS — F419 Anxiety disorder, unspecified: Secondary | ICD-10-CM | POA: Diagnosis not present

## 2019-05-26 DIAGNOSIS — J439 Emphysema, unspecified: Secondary | ICD-10-CM | POA: Insufficient documentation

## 2019-05-26 DIAGNOSIS — F17210 Nicotine dependence, cigarettes, uncomplicated: Secondary | ICD-10-CM | POA: Diagnosis not present

## 2019-05-26 DIAGNOSIS — K219 Gastro-esophageal reflux disease without esophagitis: Secondary | ICD-10-CM | POA: Insufficient documentation

## 2019-05-26 DIAGNOSIS — Z01812 Encounter for preprocedural laboratory examination: Secondary | ICD-10-CM | POA: Insufficient documentation

## 2019-05-26 DIAGNOSIS — E785 Hyperlipidemia, unspecified: Secondary | ICD-10-CM | POA: Insufficient documentation

## 2019-05-26 DIAGNOSIS — Z79899 Other long term (current) drug therapy: Secondary | ICD-10-CM | POA: Insufficient documentation

## 2019-05-26 HISTORY — DX: Exposure to other specified factors, initial encounter: X58.XXXA

## 2019-05-26 HISTORY — DX: Anxiety disorder, unspecified: F41.9

## 2019-05-26 LAB — BASIC METABOLIC PANEL
Anion gap: 8 (ref 5–15)
BUN: 19 mg/dL (ref 8–23)
CO2: 25 mmol/L (ref 22–32)
Calcium: 9.4 mg/dL (ref 8.9–10.3)
Chloride: 105 mmol/L (ref 98–111)
Creatinine, Ser: 0.99 mg/dL (ref 0.61–1.24)
GFR calc Af Amer: >60 mL/min (ref >60)
GFR calc non Af Amer: >60 mL/min (ref >60)
Glucose, Bld: 100 mg/dL — ABNORMAL HIGH (ref 70–99)
Potassium: 5.1 mmol/L (ref 3.5–5.1)
Sodium: 138 mmol/L (ref 135–145)

## 2019-05-26 LAB — CBC
HCT: 49.4 % (ref 39.0–52.0)
Hemoglobin: 16.1 g/dL (ref 13.0–17.0)
MCH: 31.3 pg (ref 26.0–34.0)
MCHC: 32.6 g/dL (ref 30.0–36.0)
MCV: 95.9 fL (ref 80.0–100.0)
Platelets: 284 10*3/uL (ref 150–400)
RBC: 5.15 MIL/uL (ref 4.22–5.81)
RDW: 14.7 % (ref 11.5–15.5)
WBC: 9.2 10*3/uL (ref 4.0–10.5)
nRBC: 0 % (ref 0.0–0.2)

## 2019-05-26 LAB — ABO/RH: ABO/RH(D): O POS

## 2019-05-26 NOTE — Telephone Encounter (Signed)
Noted  

## 2019-05-26 NOTE — Telephone Encounter (Signed)
Melissa called back-- she spoke with pt-- he's satisfied and has his concentrator. No need to call back unless further questions

## 2019-05-27 ENCOUNTER — Encounter (HOSPITAL_COMMUNITY): Payer: Medicaid Other
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
## 2019-05-28 ENCOUNTER — Other Ambulatory Visit (HOSPITAL_COMMUNITY)
Admission: RE | Admit: 2019-05-28 | Discharge: 2019-05-28 | Disposition: A | Discharge status: Home / Self Care | Payer: Medicaid Other | Source: Ambulatory Visit | Attending: Urology | Admitting: Urology

## 2019-05-28 DIAGNOSIS — Z01812 Encounter for preprocedural laboratory examination: Secondary | ICD-10-CM | POA: Diagnosis not present

## 2019-05-28 DIAGNOSIS — Z20822 Contact with and (suspected) exposure to covid-19: Secondary | ICD-10-CM | POA: Diagnosis not present

## 2019-05-28 LAB — SARS CORONAVIRUS 2 (TAT 6-24 HRS): SARS Coronavirus 2: NEGATIVE

## 2019-05-31 ENCOUNTER — Encounter (HOSPITAL_COMMUNITY): Payer: Self-pay | Admitting: Urology

## 2019-06-01 ENCOUNTER — Encounter (HOSPITAL_COMMUNITY)
Admission: RE | Disposition: A | Discharge status: Home / Self Care | Payer: Self-pay | Source: Home / Self Care | Attending: Urology

## 2019-06-01 ENCOUNTER — Other Ambulatory Visit: Payer: Self-pay

## 2019-06-01 ENCOUNTER — Encounter (HOSPITAL_COMMUNITY): Payer: Self-pay | Admitting: Urology

## 2019-06-01 ENCOUNTER — Ambulatory Visit (HOSPITAL_COMMUNITY): Payer: Medicaid Other | Admitting: Physician Assistant

## 2019-06-01 ENCOUNTER — Inpatient Hospital Stay (HOSPITAL_COMMUNITY)
Admission: RE | Admit: 2019-06-01 | Discharge: 2019-06-03 | DRG: 658 | Disposition: A | Discharge status: Home / Self Care | Payer: Medicaid Other | Attending: Urology | Admitting: Urology

## 2019-06-01 ENCOUNTER — Ambulatory Visit (HOSPITAL_COMMUNITY): Payer: Medicaid Other | Admitting: Anesthesiology

## 2019-06-01 DIAGNOSIS — N2889 Other specified disorders of kidney and ureter: Secondary | ICD-10-CM

## 2019-06-01 DIAGNOSIS — F329 Major depressive disorder, single episode, unspecified: Secondary | ICD-10-CM | POA: Diagnosis present

## 2019-06-01 DIAGNOSIS — E785 Hyperlipidemia, unspecified: Secondary | ICD-10-CM | POA: Diagnosis present

## 2019-06-01 DIAGNOSIS — F17210 Nicotine dependence, cigarettes, uncomplicated: Secondary | ICD-10-CM | POA: Diagnosis present

## 2019-06-01 DIAGNOSIS — I2510 Atherosclerotic heart disease of native coronary artery without angina pectoris: Secondary | ICD-10-CM | POA: Diagnosis present

## 2019-06-01 DIAGNOSIS — Z981 Arthrodesis status: Secondary | ICD-10-CM

## 2019-06-01 DIAGNOSIS — I252 Old myocardial infarction: Secondary | ICD-10-CM

## 2019-06-01 DIAGNOSIS — Z85118 Personal history of other malignant neoplasm of bronchus and lung: Secondary | ICD-10-CM

## 2019-06-01 DIAGNOSIS — N401 Enlarged prostate with lower urinary tract symptoms: Secondary | ICD-10-CM | POA: Diagnosis present

## 2019-06-01 DIAGNOSIS — C642 Malignant neoplasm of left kidney, except renal pelvis: Principal | ICD-10-CM | POA: Diagnosis present

## 2019-06-01 DIAGNOSIS — M47816 Spondylosis without myelopathy or radiculopathy, lumbar region: Secondary | ICD-10-CM | POA: Diagnosis present

## 2019-06-01 DIAGNOSIS — G4700 Insomnia, unspecified: Secondary | ICD-10-CM | POA: Diagnosis present

## 2019-06-01 DIAGNOSIS — K219 Gastro-esophageal reflux disease without esophagitis: Secondary | ICD-10-CM | POA: Diagnosis present

## 2019-06-01 DIAGNOSIS — J439 Emphysema, unspecified: Secondary | ICD-10-CM | POA: Diagnosis present

## 2019-06-01 HISTORY — DX: Other specified disorders of kidney and ureter: N28.89

## 2019-06-01 HISTORY — PX: ROBOTIC ASSITED PARTIAL NEPHRECTOMY: SHX6087

## 2019-06-01 LAB — HEMOGLOBIN AND HEMATOCRIT, BLOOD
HCT: 47.2 % (ref 39.0–52.0)
Hemoglobin: 14.9 g/dL (ref 13.0–17.0)

## 2019-06-01 LAB — TYPE AND SCREEN
ABO/RH(D): O POS
Antibody Screen: NEGATIVE

## 2019-06-01 SURGERY — NEPHRECTOMY, PARTIAL, ROBOT-ASSISTED
Anesthesia: General | Laterality: Left

## 2019-06-01 MED ORDER — BACITRACIN-NEOMYCIN-POLYMYXIN 400-5-5000 EX OINT: 1.0000 "application " | TOPICAL_OINTMENT | Freq: Three times a day (TID) | CUTANEOUS | Status: DC | PRN

## 2019-06-01 MED ORDER — BUPIVACAINE HCL 0.25 % IJ SOLN
INTRAMUSCULAR | Status: AC
  Filled 2019-06-01: qty 1

## 2019-06-01 MED ORDER — HYDROCODONE-ACETAMINOPHEN 5-325 MG PO TABS: 1.0000 | ORAL_TABLET | Freq: Four times a day (QID) | ORAL | 0 refills | Status: DC | PRN

## 2019-06-01 MED ORDER — DOCUSATE SODIUM 100 MG PO CAPS
100.0000 mg | ORAL_CAPSULE | Freq: Two times a day (BID) | ORAL | Status: DC
  Administered 2019-06-01 – 2019-06-03 (×4): 100 mg via ORAL
  Filled 2019-06-01 (×4): qty 1

## 2019-06-01 MED ORDER — ALBUTEROL SULFATE (2.5 MG/3ML) 0.083% IN NEBU
2.5000 mg | INHALATION_SOLUTION | Freq: Four times a day (QID) | RESPIRATORY_TRACT | Status: DC | PRN
  Administered 2019-06-02: 2.5 mg via RESPIRATORY_TRACT
  Filled 2019-06-01: qty 3

## 2019-06-01 MED ORDER — DEXAMETHASONE SODIUM PHOSPHATE 10 MG/ML IJ SOLN
INTRAMUSCULAR | Status: DC | PRN
  Administered 2019-06-01: 8 mg via INTRAVENOUS

## 2019-06-01 MED ORDER — BUPIVACAINE LIPOSOME 1.3 % IJ SUSP
20.0000 mL | Freq: Once | INTRAMUSCULAR | Status: AC
  Administered 2019-06-01: 20 mL
  Filled 2019-06-01: qty 20

## 2019-06-01 MED ORDER — FENTANYL CITRATE (PF) 250 MCG/5ML IJ SOLN
INTRAMUSCULAR | Status: AC
  Filled 2019-06-01: qty 5

## 2019-06-01 MED ORDER — LACTATED RINGERS IV SOLN: INTRAVENOUS | Status: DC

## 2019-06-01 MED ORDER — ACETAMINOPHEN 325 MG PO TABS
650.0000 mg | ORAL_TABLET | ORAL | Status: DC | PRN
  Administered 2019-06-02 – 2019-06-03 (×4): 650 mg via ORAL
  Filled 2019-06-01 (×4): qty 2

## 2019-06-01 MED ORDER — DIPHENHYDRAMINE HCL 50 MG/ML IJ SOLN: 12.5000 mg | Freq: Four times a day (QID) | INTRAMUSCULAR | Status: DC | PRN

## 2019-06-01 MED ORDER — "VISTASEAL 4 ML SINGLE DOSE KIT "
4.0000 mL | PACK | Freq: Once | CUTANEOUS | Status: DC
  Filled 2019-06-01: qty 4

## 2019-06-01 MED ORDER — LIDOCAINE 2% (20 MG/ML) 5 ML SYRINGE
INTRAMUSCULAR | Status: DC | PRN
  Administered 2019-06-01: 80 mg via INTRAVENOUS

## 2019-06-01 MED ORDER — ACETAMINOPHEN 10 MG/ML IV SOLN: 1000.0000 mg | Freq: Once | INTRAVENOUS | Status: DC | PRN

## 2019-06-01 MED ORDER — ROCURONIUM BROMIDE 10 MG/ML (PF) SYRINGE
PREFILLED_SYRINGE | INTRAVENOUS | Status: DC | PRN
  Administered 2019-06-01 (×2): 20 mg via INTRAVENOUS
  Administered 2019-06-01: 60 mg via INTRAVENOUS

## 2019-06-01 MED ORDER — PHENYLEPHRINE HCL-NACL 10-0.9 MG/250ML-% IV SOLN
INTRAVENOUS | Status: DC | PRN
  Administered 2019-06-01: 35 ug/min via INTRAVENOUS

## 2019-06-01 MED ORDER — HYDROMORPHONE HCL 1 MG/ML IJ SOLN
INTRAMUSCULAR | Status: AC
  Filled 2019-06-01: qty 1

## 2019-06-01 MED ORDER — PANTOPRAZOLE SODIUM 40 MG PO TBEC
80.0000 mg | DELAYED_RELEASE_TABLET | Freq: Every day | ORAL | Status: DC
  Administered 2019-06-02 – 2019-06-03 (×2): 80 mg via ORAL
  Filled 2019-06-01 (×2): qty 2

## 2019-06-01 MED ORDER — DIPHENHYDRAMINE HCL 12.5 MG/5ML PO ELIX: 12.5000 mg | ORAL_SOLUTION | Freq: Four times a day (QID) | ORAL | Status: DC | PRN

## 2019-06-01 MED ORDER — PROPOFOL 10 MG/ML IV BOLUS
INTRAVENOUS | Status: DC | PRN
  Administered 2019-06-01: 150 mg via INTRAVENOUS

## 2019-06-01 MED ORDER — NICOTINE 21 MG/24HR TD PT24
21.0000 mg | MEDICATED_PATCH | Freq: Every day | TRANSDERMAL | Status: DC
  Administered 2019-06-02 – 2019-06-03 (×2): 21 mg via TRANSDERMAL
  Filled 2019-06-01 (×2): qty 1

## 2019-06-01 MED ORDER — BUPIVACAINE HCL (PF) 0.25 % IJ SOLN
INTRAMUSCULAR | Status: DC | PRN
  Administered 2019-06-01: 20 mL

## 2019-06-01 MED ORDER — OXYCODONE HCL 5 MG/5ML PO SOLN: 5.0000 mg | Freq: Once | ORAL | Status: DC | PRN

## 2019-06-01 MED ORDER — MIDAZOLAM HCL 2 MG/2ML IJ SOLN
INTRAMUSCULAR | Status: AC
  Filled 2019-06-01: qty 2

## 2019-06-01 MED ORDER — TAMSULOSIN HCL 0.4 MG PO CAPS
0.4000 mg | ORAL_CAPSULE | Freq: Every day | ORAL | Status: DC
  Administered 2019-06-01 – 2019-06-03 (×3): 0.4 mg via ORAL
  Filled 2019-06-01 (×3): qty 1

## 2019-06-01 MED ORDER — SUGAMMADEX SODIUM 200 MG/2ML IV SOLN
INTRAVENOUS | Status: DC | PRN
  Administered 2019-06-01: 200 mg via INTRAVENOUS

## 2019-06-01 MED ORDER — HYDROMORPHONE HCL 1 MG/ML IJ SOLN
0.2500 mg | INTRAMUSCULAR | Status: DC | PRN
  Administered 2019-06-01: 0.5 mg via INTRAVENOUS
  Administered 2019-06-01 (×2): 0.25 mg via INTRAVENOUS

## 2019-06-01 MED ORDER — BUPROPION HCL ER (XL) 300 MG PO TB24: 300.0000 mg | ORAL_TABLET | Freq: Every day | ORAL | Status: DC

## 2019-06-01 MED ORDER — PREGABALIN 75 MG PO CAPS
75.0000 mg | ORAL_CAPSULE | Freq: Every day | ORAL | Status: DC
  Administered 2019-06-02 – 2019-06-03 (×2): 75 mg via ORAL
  Filled 2019-06-01 (×2): qty 1

## 2019-06-01 MED ORDER — ONDANSETRON HCL 4 MG/2ML IJ SOLN
4.0000 mg | INTRAMUSCULAR | Status: DC | PRN
  Administered 2019-06-02 – 2019-06-03 (×4): 4 mg via INTRAVENOUS
  Filled 2019-06-01 (×4): qty 2

## 2019-06-01 MED ORDER — DEXTROSE-NACL 5-0.45 % IV SOLN: INTRAVENOUS | Status: DC

## 2019-06-01 MED ORDER — OXYCODONE HCL 5 MG PO TABS
5.0000 mg | ORAL_TABLET | ORAL | Status: DC | PRN
  Administered 2019-06-01 – 2019-06-02 (×2): 5 mg via ORAL
  Filled 2019-06-01 (×2): qty 1

## 2019-06-01 MED ORDER — MIDAZOLAM HCL 5 MG/5ML IJ SOLN
INTRAMUSCULAR | Status: DC | PRN
  Administered 2019-06-01: 2 mg via INTRAVENOUS

## 2019-06-01 MED ORDER — ONDANSETRON HCL 4 MG/2ML IJ SOLN
INTRAMUSCULAR | Status: DC | PRN
  Administered 2019-06-01: 4 mg via INTRAVENOUS

## 2019-06-01 MED ORDER — HYDROMORPHONE HCL 1 MG/ML IJ SOLN
0.5000 mg | INTRAMUSCULAR | Status: DC | PRN
  Administered 2019-06-01 – 2019-06-02 (×4): 1 mg via INTRAVENOUS
  Filled 2019-06-01 (×4): qty 1

## 2019-06-01 MED ORDER — UMECLIDINIUM-VILANTEROL 62.5-25 MCG/INH IN AEPB
1.0000 | INHALATION_SPRAY | Freq: Every day | RESPIRATORY_TRACT | Status: DC
  Administered 2019-06-02 – 2019-06-03 (×2): 1 via RESPIRATORY_TRACT
  Filled 2019-06-01: qty 14

## 2019-06-01 MED ORDER — CEFAZOLIN SODIUM-DEXTROSE 2-4 GM/100ML-% IV SOLN
2.0000 g | Freq: Once | INTRAVENOUS | Status: AC
  Administered 2019-06-01: 2 g via INTRAVENOUS
  Filled 2019-06-01: qty 100

## 2019-06-01 MED ORDER — ALBUMIN HUMAN 5 % IV SOLN
INTRAVENOUS | Status: AC
  Filled 2019-06-01: qty 250

## 2019-06-01 MED ORDER — OXYCODONE HCL 5 MG PO TABS: 5.0000 mg | ORAL_TABLET | Freq: Once | ORAL | Status: DC | PRN

## 2019-06-01 MED ORDER — AZELASTINE HCL 0.1 % NA SOLN
2.0000 | Freq: Every day | NASAL | Status: DC
  Administered 2019-06-02 – 2019-06-03 (×2): 2 via NASAL
  Filled 2019-06-01: qty 30

## 2019-06-01 MED ORDER — PROMETHAZINE HCL 12.5 MG PO TABS
12.5000 mg | ORAL_TABLET | ORAL | 0 refills | Status: DC | PRN
Start: 2019-06-01 — End: 2022-02-05

## 2019-06-01 MED ORDER — BELLADONNA ALKALOIDS-OPIUM 16.2-60 MG RE SUPP: 1.0000 | Freq: Four times a day (QID) | RECTAL | Status: DC | PRN

## 2019-06-01 MED ORDER — ALBUMIN HUMAN 5 % IV SOLN: INTRAVENOUS | Status: DC | PRN

## 2019-06-01 MED ORDER — STERILE WATER FOR IRRIGATION IR SOLN
Status: DC | PRN
  Administered 2019-06-01: 1000 mL

## 2019-06-01 MED ORDER — FENTANYL CITRATE (PF) 100 MCG/2ML IJ SOLN
INTRAMUSCULAR | Status: DC | PRN
  Administered 2019-06-01 (×4): 50 ug via INTRAVENOUS

## 2019-06-01 MED ORDER — LACTATED RINGERS IV SOLN: INTRAVENOUS | Status: DC | PRN

## 2019-06-01 MED ORDER — DOCUSATE SODIUM 100 MG PO CAPS
100.0000 mg | ORAL_CAPSULE | Freq: Two times a day (BID) | ORAL | Status: DC
Start: 2019-06-01 — End: 2020-01-10

## 2019-06-01 SURGICAL SUPPLY — 69 items
APPLICATOR VISTASEAL 35 (MISCELLANEOUS) ×3 IMPLANT
CHLORAPREP W/TINT 26 (MISCELLANEOUS) ×3 IMPLANT
CLIP SUT LAPRA TY ABSORB (SUTURE) ×6 IMPLANT
CLIP VESOLOCK LG 6/CT PURPLE (CLIP) ×3 IMPLANT
CLIP VESOLOCK MED LG 6/CT (CLIP) ×12 IMPLANT
CLIP VESOLOCK XL 6/CT (CLIP) IMPLANT
COVER SURGICAL LIGHT HANDLE (MISCELLANEOUS) ×3 IMPLANT
COVER TIP SHEARS 8 DVNC (MISCELLANEOUS) ×1 IMPLANT
COVER TIP SHEARS 8MM DA VINCI (MISCELLANEOUS) ×2
COVER WAND RF STERILE (DRAPES) IMPLANT
CUTTER ECHEON FLEX ENDO 45 340 (ENDOMECHANICALS) IMPLANT
DECANTER SPIKE VIAL GLASS SM (MISCELLANEOUS) ×3 IMPLANT
DERMABOND ADVANCED (GAUZE/BANDAGES/DRESSINGS) ×2
DERMABOND ADVANCED .7 DNX12 (GAUZE/BANDAGES/DRESSINGS) ×1 IMPLANT
DRAIN CHANNEL 15F RND FF 3/16 (WOUND CARE) IMPLANT
DRAPE ARM DVNC X/XI (DISPOSABLE) ×4 IMPLANT
DRAPE COLUMN DVNC XI (DISPOSABLE) ×1 IMPLANT
DRAPE DA VINCI XI ARM (DISPOSABLE) ×8
DRAPE DA VINCI XI COLUMN (DISPOSABLE) ×2
DRAPE INCISE IOBAN 66X45 STRL (DRAPES) ×3 IMPLANT
DRAPE SHEET LG 3/4 BI-LAMINATE (DRAPES) ×3 IMPLANT
ELECT REM PT RETURN 15FT ADLT (MISCELLANEOUS) ×3 IMPLANT
EVACUATOR SILICONE 100CC (DRAIN) IMPLANT
GLOVE BIO SURGEON STRL SZ 6.5 (GLOVE) ×2 IMPLANT
GLOVE BIO SURGEONS STRL SZ 6.5 (GLOVE) ×1
GLOVE BIOGEL M STRL SZ7.5 (GLOVE) ×6 IMPLANT
GLOVE BIOGEL PI IND STRL 8 (GLOVE) ×2 IMPLANT
GLOVE BIOGEL PI INDICATOR 8 (GLOVE) ×4
GOWN STRL REUS W/TWL LRG LVL3 (GOWN DISPOSABLE) ×3 IMPLANT
GOWN STRL REUS W/TWL XL LVL3 (GOWN DISPOSABLE) ×6 IMPLANT
HEMOSTAT SURGICEL 4X8 (HEMOSTASIS) ×6 IMPLANT
IRRIG SUCT STRYKERFLOW 2 WTIP (MISCELLANEOUS) ×3
IRRIGATION SUCT STRKRFLW 2 WTP (MISCELLANEOUS) ×1 IMPLANT
KIT BASIN (CUSTOM PROCEDURE TRAY) ×3 IMPLANT
KIT TURNOVER KIT A (KITS) IMPLANT
MARKER SKIN DUAL TIP RULER LAB (MISCELLANEOUS) ×3 IMPLANT
NEEDLE INSUFFLATION 14GA 120MM (NEEDLE) ×3 IMPLANT
PENCIL SMOKE EVACUATOR (MISCELLANEOUS) IMPLANT
POUCH SPECIMEN RETRIEVAL 10MM (ENDOMECHANICALS) ×3 IMPLANT
PROTECTOR NERVE ULNAR (MISCELLANEOUS) ×6 IMPLANT
SEAL CANN UNIV 5-8 DVNC XI (MISCELLANEOUS) ×3 IMPLANT
SEAL XI 5MM-8MM UNIVERSAL (MISCELLANEOUS) ×6
SEALANT SURGICAL APPL DUAL CAN (MISCELLANEOUS) IMPLANT
SET TUBE SMOKE EVAC HIGH FLOW (TUBING) ×3 IMPLANT
SOLUTION ELECTROLUBE (MISCELLANEOUS) ×3 IMPLANT
STAPLE RELOAD 45 WHT (STAPLE) IMPLANT
STAPLE RELOAD 45MM WHITE (STAPLE)
SUT ETHILON 2 0 PSLX (SUTURE) IMPLANT
SUT MNCRL AB 4-0 PS2 18 (SUTURE) ×6 IMPLANT
SUT PDS AB 0 CT1 36 (SUTURE) IMPLANT
SUT SILK 3 0 SH CR/8 (SUTURE) ×3 IMPLANT
SUT V-LOC BARB 180 2/0GR6 GS22 (SUTURE)
SUT VIC AB 1 CT1 27 (SUTURE) ×10
SUT VIC AB 1 CT1 27XBRD ANTBC (SUTURE) ×5 IMPLANT
SUT VIC AB 2-0 SH 27 (SUTURE) ×4
SUT VIC AB 2-0 SH 27X BRD (SUTURE) ×1 IMPLANT
SUT VIC AB 2-0 SH 27XBRD (SUTURE) ×1 IMPLANT
SUT VICRYL 0 UR6 27IN ABS (SUTURE) IMPLANT
SUT VLOC BARB 180 ABS3/0GR12 (SUTURE) ×9
SUTURE V-LC BRB 180 2/0GR6GS22 (SUTURE) IMPLANT
SUTURE VLOC BRB 180 ABS3/0GR12 (SUTURE) ×3 IMPLANT
TOWEL OR 17X26 10 PK STRL BLUE (TOWEL DISPOSABLE) ×3 IMPLANT
TOWEL OR NON WOVEN STRL DISP B (DISPOSABLE) ×3 IMPLANT
TRAY FOLEY MTR SLVR 16FR STAT (SET/KITS/TRAYS/PACK) ×3 IMPLANT
TRAY LAPAROSCOPIC (CUSTOM PROCEDURE TRAY) ×3 IMPLANT
TROCAR BLADELESS OPT 5 100 (ENDOMECHANICALS) IMPLANT
TROCAR UNIVERSAL OPT 12M 100M (ENDOMECHANICALS) IMPLANT
TROCAR XCEL 12X100 BLDLESS (ENDOMECHANICALS) ×3 IMPLANT
WATER STERILE IRR 1000ML POUR (IV SOLUTION) ×3 IMPLANT

## 2019-06-01 NOTE — Anesthesia Procedure Notes (Signed)
Procedure Name: Intubation Date/Time: 06/01/2019 12:00 PM Performed by: Lavina Hamman, CRNA Pre-anesthesia Checklist: Patient identified, Emergency Drugs available, Suction available, Patient being monitored and Timeout performed Patient Re-evaluated:Patient Re-evaluated prior to induction Oxygen Delivery Method: Circle system utilized Preoxygenation: Pre-oxygenation with 100% oxygen Induction Type: IV induction Ventilation: Mask ventilation without difficulty Laryngoscope Size: Mac and 3 Grade View: Grade I Tube type: Oral Tube size: 7.5 mm Number of attempts: 1 Airway Equipment and Method: Stylet Placement Confirmation: ETT inserted through vocal cords under direct vision,  positive ETCO2,  CO2 detector and breath sounds checked- equal and bilateral Secured at: 22 cm Tube secured with: Tape Dental Injury: Teeth and Oropharynx as per pre-operative assessment

## 2019-06-01 NOTE — Progress Notes (Signed)
Patient has received all personal belongings.

## 2019-06-01 NOTE — Anesthesia Preprocedure Evaluation (Signed)
Anesthesia Evaluation  Patient identified by MRN, date of birth, ID band Patient awake    Reviewed: Allergy & Precautions, NPO status , Patient's Chart, lab work & pertinent test results  Airway Mallampati: II  TM Distance: >3 FB Neck ROM: Full    Dental no notable dental hx.    Pulmonary COPD, Current Smoker and Patient abstained from smoking.,    Pulmonary exam normal breath sounds clear to auscultation       Cardiovascular + Past MI   Rhythm:Regular Rate:Bradycardia     Neuro/Psych negative neurological ROS  negative psych ROS   GI/Hepatic negative GI ROS, (+)     substance abuse  cocaine use,   Endo/Other  negative endocrine ROS  Renal/GU negative Renal ROS  negative genitourinary   Musculoskeletal negative musculoskeletal ROS (+)   Abdominal   Peds negative pediatric ROS (+)  Hematology negative hematology ROS (+)   Anesthesia Other Findings   Reproductive/Obstetrics negative OB ROS                             Anesthesia Physical Anesthesia Plan  ASA: III  Anesthesia Plan: General   Post-op Pain Management:    Induction: Intravenous  PONV Risk Score and Plan: 2 and Ondansetron, Dexamethasone and Treatment may vary due to age or medical condition  Airway Management Planned: Oral ETT  Additional Equipment:   Intra-op Plan:   Post-operative Plan: Extubation in OR  Informed Consent: I have reviewed the patients History and Physical, chart, labs and discussed the procedure including the risks, benefits and alternatives for the proposed anesthesia with the patient or authorized representative who has indicated his/her understanding and acceptance.     Dental advisory given  Plan Discussed with: CRNA and Surgeon  Anesthesia Plan Comments:         Anesthesia Quick Evaluation

## 2019-06-01 NOTE — Discharge Instructions (Signed)

## 2019-06-01 NOTE — Plan of Care (Addendum)
Bed alarm set. Patient has call bell within reach. Pt will call for assistance when needing to get out of bed. Pt reports having clothing and a cell phone. PACU called to locate personal belongings.

## 2019-06-01 NOTE — Op Note (Signed)
Operative Note  Preoperative diagnosis:  1.  2.5 cm left renal mass  Postoperative diagnosis: 1.  2.5 cm left renal mass  Procedure(s): 1.  Robot-assisted laparoscopic left partial nephrectomy  Surgeon: Ellison Hughs, MD  Assistants:  None  Anesthesia:  General  Complications:  None  EBL: 50 mL  Specimens: 1.  Left renal mass  Drains/Catheters: 1.  Foley catheter  Intraoperative findings:   1. Exophytic 2.5 cm left upper pole renal mass  Indication:  [name] is a 64 y.o. male with a 2.5 cm left renal mass seen on surveillance imaging.  He has a history of stage Ib none small cell lung cancer  (s/p right upper lobe resection in 2018 and left upper lobectomy in 2013) along with severe COPD.  He has been consented for the above procedures, voices understanding and wishes to proceed.  Description of procedure:  After informed consent was obtained, the patient was brought to the operating room and general endotracheal anesthesia was administered.  The patient was then placed in the right lateral decubitus position and prepped and draped in usual sterile fashion.  A timeout was performed.  An 8 mm incision was then made lateral to the left rectus muscle at the level of the left 12th rib.  Abdominal access was obtained via a Veress needle.  The abdominal cavity was then insufflated up to 15 mmHg.  An 8 mm port was then introduced into the abdominal cavity.  Inspection of the port entry site by the robotic camera revealed no adjacent organ injury.  We then placed 3 additional 8 mm robotic ports to triangulate the left renal hilum.  A 12 mm assistant port was then placed between the carmera port and 3rd robotic arm.  The white line of Toldt along the descending colon was incised sharply and the colon, along with its mesocolonic fat, was reflected medially until the aorta was identified.  We then made a small window adjacent to the lower pole of the left kidney, identifying the  left psoas muscle, left ureter and left gonadal vein.  The left ureter and gonadal vein were then reflected anteriorly allowing [place] to then incised the perihilar attachments using electrocautery.  We encountered a small lumbar vein adjacent to the insertion of the left gonadal vein into the left renal vein.  This lumbar vein was ligated with hemo-lock clips in 2 places and incised sharply.  This provided [place] excellent exposure to the left renal hilum.  The perilymphatic tissue surrounding the left renal artery was carefully dissected away, creating a window to place a bulldog clamp later in the procedure.  The anterior portion of Gerota's fascia was incised, allowing reflection the perinephric fat medially and laterally until there was adequate exposure of the upper pole left renal mass.  Intraoperative ultrasound confirmed the heterogenous echogenicity of the lesion compared to the remainder of the renal parenchyma and allowed identification of the depth/borders of the mass, which were demarcated using electrocautery along the renal capsule.  We then exposed the left renal artery and placed a bulldog clamp, marking warm ischemia time.  The left kidney immediately became ischemic and pale in appearance.  The left renal mass was then sharply excised with minimal blood loss.  After the mass was free, it was placed in the left upper quadrant to be retrieved later on during the operation.    The renorrhaphy was then performed using a 3-0 V-lock in the deep layer of the renal parenchyma and then  a series of horizontal mattress sutures were used to reapproximate the renal capsule using interupted 1-0 Vicryl suture along with hemo-lock clips act as a buttress against the renal capsule.  Once adequate tension was placed on the renorrhaphy sutures and the repair appeared hemostatic, we then removed the bulldog clamp marking the end of warm ischemia time at 8 minutes.  There did not appear to be any obvious bleeding around  the renal hilum nor surrounding our repair.  The incised Gerota's fascia overlying the mass was then reapproximated using a running 2-0 V lock suture.  The mass was then placed in an Endo Catch bag.  The robot was then de-docked and the camera was then reinserted into the assistant port. Laparoscopic graspers were then used to grab the string of the Endo Catch bag, which was brought out through the 12 mm assistant port.  The abdomen was then desufflated and all ports were removed.  The assistant port incision was then extended approximately 1 cm and the left renal mass, within the Endo Catch bag, was removed and sent to pathology for permanent section.  The fascia within the assistant port incision was then reapproximated using a 0 Vicryl suture.  The remainder of the incisions were then closed using 4-0 Monocryl and dressed appropriately.  Patient tolerated the procedure well and was transferred to the postanesthesia unit in stable condition.   Plan:  Bedrest overnight. Foley out in the AM.  Nicotine patch.
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
## 2019-06-02 ENCOUNTER — Encounter: Payer: Self-pay | Admitting: *Deleted

## 2019-06-02 DIAGNOSIS — C642 Malignant neoplasm of left kidney, except renal pelvis: Secondary | ICD-10-CM | POA: Diagnosis not present

## 2019-06-02 DIAGNOSIS — J439 Emphysema, unspecified: Secondary | ICD-10-CM | POA: Diagnosis not present

## 2019-06-02 DIAGNOSIS — Z85118 Personal history of other malignant neoplasm of bronchus and lung: Secondary | ICD-10-CM | POA: Diagnosis not present

## 2019-06-02 DIAGNOSIS — N401 Enlarged prostate with lower urinary tract symptoms: Secondary | ICD-10-CM | POA: Diagnosis not present

## 2019-06-02 DIAGNOSIS — I252 Old myocardial infarction: Secondary | ICD-10-CM | POA: Diagnosis not present

## 2019-06-02 DIAGNOSIS — G4700 Insomnia, unspecified: Secondary | ICD-10-CM | POA: Diagnosis not present

## 2019-06-02 DIAGNOSIS — E785 Hyperlipidemia, unspecified: Secondary | ICD-10-CM | POA: Diagnosis not present

## 2019-06-02 DIAGNOSIS — N2889 Other specified disorders of kidney and ureter: Secondary | ICD-10-CM | POA: Diagnosis present

## 2019-06-02 DIAGNOSIS — F17210 Nicotine dependence, cigarettes, uncomplicated: Secondary | ICD-10-CM | POA: Diagnosis not present

## 2019-06-02 DIAGNOSIS — M47816 Spondylosis without myelopathy or radiculopathy, lumbar region: Secondary | ICD-10-CM | POA: Diagnosis not present

## 2019-06-02 DIAGNOSIS — F329 Major depressive disorder, single episode, unspecified: Secondary | ICD-10-CM | POA: Diagnosis not present

## 2019-06-02 DIAGNOSIS — Z981 Arthrodesis status: Secondary | ICD-10-CM | POA: Diagnosis not present

## 2019-06-02 DIAGNOSIS — K219 Gastro-esophageal reflux disease without esophagitis: Secondary | ICD-10-CM | POA: Diagnosis not present

## 2019-06-02 DIAGNOSIS — I2510 Atherosclerotic heart disease of native coronary artery without angina pectoris: Secondary | ICD-10-CM | POA: Diagnosis not present

## 2019-06-02 LAB — BASIC METABOLIC PANEL
Anion gap: 9 (ref 5–15)
BUN: 15 mg/dL (ref 8–23)
CO2: 24 mmol/L (ref 22–32)
Calcium: 8.4 mg/dL — ABNORMAL LOW (ref 8.9–10.3)
Chloride: 101 mmol/L (ref 98–111)
Creatinine, Ser: 1.06 mg/dL (ref 0.61–1.24)
GFR calc Af Amer: >60 mL/min (ref >60)
GFR calc non Af Amer: >60 mL/min (ref >60)
Glucose, Bld: 134 mg/dL — ABNORMAL HIGH (ref 70–99)
Potassium: 4.1 mmol/L (ref 3.5–5.1)
Sodium: 134 mmol/L — ABNORMAL LOW (ref 135–145)

## 2019-06-02 LAB — HEMOGLOBIN AND HEMATOCRIT, BLOOD
HCT: 44.2 % (ref 39.0–52.0)
Hemoglobin: 14.3 g/dL (ref 13.0–17.0)

## 2019-06-02 MED ORDER — SENNA 8.6 MG PO TABS
1.0000 | ORAL_TABLET | Freq: Every day | ORAL | Status: DC
  Administered 2019-06-02 – 2019-06-03 (×2): 8.6 mg via ORAL
  Filled 2019-06-02 (×2): qty 1

## 2019-06-02 MED ORDER — IPRATROPIUM BROMIDE 0.02 % IN SOLN
0.5000 mg | Freq: Four times a day (QID) | RESPIRATORY_TRACT | Status: DC | PRN
  Administered 2019-06-02: 0.5 mg via RESPIRATORY_TRACT
  Filled 2019-06-02: qty 2.5

## 2019-06-02 MED ORDER — SODIUM CHLORIDE 0.9 % IV BOLUS
500.0000 mL | Freq: Once | INTRAVENOUS | Status: AC
  Administered 2019-06-02: 500 mL via INTRAVENOUS

## 2019-06-02 MED ORDER — CHLORHEXIDINE GLUCONATE CLOTH 2 % EX PADS: 6.0000 | MEDICATED_PAD | Freq: Every day | CUTANEOUS | Status: DC

## 2019-06-02 MED ORDER — DEXTROSE-NACL 5-0.45 % IV SOLN: INTRAVENOUS | Status: DC

## 2019-06-02 NOTE — Progress Notes (Signed)
   06/02/19 1428  Assess: MEWS Score  Temp 98 F (36.7 C)  BP (!) 80/54  Pulse Rate 63  Resp 16  Level of Consciousness Alert  SpO2 (!) 89 %  O2 Device Nasal Cannula  O2 Flow Rate (L/min) 1 L/min  Assess: MEWS Score  MEWS Temp 0  MEWS Systolic 2  MEWS Pulse 0  MEWS RR 0  MEWS LOC 0  MEWS Score 2  MEWS Score Color Yellow  Assess: if the MEWS score is Yellow or Red  Were vital signs taken at a resting state? Yes  Focused Assessment Documented focused assessment  Early Detection of Sepsis Score *See Row Information* Low  MEWS guidelines implemented *See Row Information* Yes  Treat  MEWS Interventions Escalated (See documentation below)  Take Vital Signs  Increase Vital Sign Frequency  Yellow: Q 2hr X 2 then Q 4hr X 2, if remains yellow, continue Q 4hrs  Escalate  MEWS: Escalate Yellow: discuss with charge nurse/RN and consider discussing with provider and RRT  Notify: Charge Nurse/RN  Name of Charge Nurse/RN Notified M.Bullins,RN  Date Charge Nurse/RN Notified 06/02/19  Time Charge Nurse/RN Notified 7915  Notify: Provider  Provider Name/Title Dr. Lovena Neighbours  Date Provider Notified 06/02/19  Time Provider Notified 1500  Notification Type Call  Notification Reason Other (Comment) (change in VS (BP))  Response See new orders  Date of Provider Response 06/02/19  Time of Provider Response 1500   Dr. Lovena Neighbours notified of pt's BP.  Orders for NS bolus received. Will continue to monitor pt. Geraldean Walen, Laurel Dimmer, RN

## 2019-06-02 NOTE — Transfer of Care (Signed)
Immediate Anesthesia Transfer of Care Note  Patient: [name]  Procedure(s) Performed: XI ROBOTIC ASSITED PARTIAL NEPHRECTOMY (Left )  Patient Location: PACU  Anesthesia Type:General  Level of Consciousness: awake, alert  and oriented  Airway & Oxygen Therapy: Patient Spontanous Breathing and Patient connected to face mask oxygen  Post-op Assessment: Report given to RN  Post vital signs: Reviewed and stable  Last Vitals:  Vitals Value Taken Time  BP 140/81 06/02/19 0519  Temp 36.6 C 06/02/19 0519  Pulse 46 06/02/19 0519  Resp 20 06/02/19 0519  SpO2 95 % 06/02/19 0519    Last Pain:  Vitals:   06/02/19 0519  TempSrc: Oral  PainSc: Asleep      Patients Stated Pain Goal: 2 (25/49/82 6415)  Complications: No apparent anesthesia complications

## 2019-06-02 NOTE — Progress Notes (Signed)
Bladder scan result-203 ml, pt does not complain of discomfort or urge to void at this time. Will continue to monitor. Reighan Hipolito, Laurel Dimmer, RN

## 2019-06-02 NOTE — Progress Notes (Addendum)
Urology Progress Note   1 Day Post-Op from left robotic partial nephrectomy.   Subjective: NAEON.  Cr 1.06 from baseline 0.99 Hgb 14.3 from 14.9 post op Good UOP overnight; 1.1L since OR  Pain relatively well controlled 5/10.  No flatus yet.  [place].  No nausea / emesis at this time  PM ADDENDUM: BP of 80/54. Will bolus and recheck. Has walked once with some pain and lightheadedness. Ensure taking PO fluids aggressively. Follow up TOV  Objective: Vital signs in last 24 hours: Temp:  [97.9 F (36.6 C)-98.5 F (36.9 C)] 98.2 F (36.8 C) (04/29 1507) Pulse Rate:  [45-63] 57 (04/29 1554) Resp:  [14-20] 18 (04/29 1554) BP: (80-167)/(54-103) 112/72 (04/29 1554) SpO2:  [89 %-100 %] 100 % (04/29 1554)  Intake/Output from previous day: 04/28 0701 - 04/29 0700 In: 2050 [I.V.:1800; IV Piggyback:250] Out: 1200 [Urine:1150; Blood:50] Intake/Output this shift: Total I/O In: 150 [I.V.:150] Out: 550 [Urine:550]  Physical Exam:  General: Alert and oriented CV: Regular rate Lungs: No increased work of breathing Abdomen: Soft, appropriately tender. Incisions c/d/i. GU: Foley in place draining clear yellow urine  Ext: NT, No erythema  Lab Results: Recent Labs    06/01/19 1453 06/02/19 0459  HGB 14.9 14.3  HCT 47.2 44.2   Recent Labs    06/02/19 0459  NA 134*  K 4.1  CL 101  CO2 24  GLUCOSE 134*  BUN 15  CREATININE 1.06  CALCIUM 8.4*    Studies/Results: No results found.  Assessment/Plan:  64 y.o. male s/p left robotic partial nephrectomy.  Overall doing well post-op.   - Multimodal analgesia - On 2L [place] at home, ween to baseline - Home inhalers - Nicotine patch - General diet - Bowel regimen - prn antiemetics - PPI - Flomax - Medlock - Remove foley, TOV today - SCDs, OOB, SQH  Dispo: This afternoon if progresses well clinically; otherwise anticipate tomorrow.    LOS: 0 days

## 2019-06-02 NOTE — Plan of Care (Signed)
  Problem: Activity: Goal: Risk for activity intolerance will decrease Outcome: Progressing   Problem: Nutrition: Goal: Adequate nutrition will be maintained Outcome: Progressing   Problem: Education: Goal: Knowledge of General Education information will improve Description: Including pain rating scale, medication(s)/side effects and non-pharmacologic comfort measures Outcome: Completed/Met   Problem: Clinical Measurements: Goal: Respiratory complications will improve Outcome: Completed/Met

## 2019-06-03 NOTE — Anesthesia Postprocedure Evaluation (Signed)
Anesthesia Post Note  Patient: [name]  Procedure(s) Performed: XI ROBOTIC ASSITED PARTIAL NEPHRECTOMY (Left )     Patient location during evaluation: PACU Anesthesia Type: General Level of consciousness: awake and alert Pain management: pain level controlled Vital Signs Assessment: post-procedure vital signs reviewed and stable Respiratory status: spontaneous breathing, nonlabored ventilation, respiratory function stable and patient connected to nasal cannula oxygen Cardiovascular status: blood pressure returned to baseline and stable Postop Assessment: no apparent nausea or vomiting Anesthetic complications: no    Last Vitals:  Vitals:   06/03/19 0341 06/03/19 0600  BP: 117/72 117/73  Pulse: 62 (!) 56  Resp: 18 18  Temp: 37.6 C 37.1 C  SpO2: 100% 96%    Last Pain:  Vitals:   06/03/19 0500  TempSrc:   PainSc: 7                  Geo Slone S

## 2019-06-03 NOTE — Plan of Care (Signed)
  Problem: Health Behavior/Discharge Planning: Goal: Ability to manage health-related needs will improve Outcome: Adequate for Discharge   Problem: Clinical Measurements: Goal: Ability to maintain clinical measurements within normal limits will improve Outcome: Adequate for Discharge Goal: Will remain free from infection Outcome: Adequate for Discharge Goal: Diagnostic test results will improve Outcome: Adequate for Discharge Goal: Cardiovascular complication will be avoided Outcome: Adequate for Discharge   Problem: Activity: Goal: Risk for activity intolerance will decrease Outcome: Adequate for Discharge   Problem: Nutrition: Goal: Adequate nutrition will be maintained Outcome: Adequate for Discharge   Problem: Coping: Goal: Level of anxiety will decrease Outcome: Adequate for Discharge   Problem: Elimination: Goal: Will not experience complications related to bowel motility Outcome: Adequate for Discharge Goal: Will not experience complications related to urinary retention Outcome: Adequate for Discharge   Problem: Pain Managment: Goal: General experience of comfort will improve Outcome: Adequate for Discharge   Problem: Safety: Goal: Ability to remain free from injury will improve Outcome: Adequate for Discharge   Problem: Skin Integrity: Goal: Risk for impaired skin integrity will decrease Outcome: Adequate for Discharge   Problem: Education: Goal: Knowledge of the prescribed therapeutic regimen will improve Outcome: Adequate for Discharge   Problem: Bowel/Gastric: Goal: Gastrointestinal status for postoperative course will improve Outcome: Adequate for Discharge   Problem: Clinical Measurements: Goal: Postoperative complications will be avoided or minimized Outcome: Adequate for Discharge   Problem: Respiratory: Goal: Ability to achieve and maintain a regular respiratory rate will improve Outcome: Adequate for Discharge   Problem: Skin  Integrity: Goal: Demonstration of wound healing without infection will improve Outcome: Adequate for Discharge   Problem: Urinary Elimination: Goal: Ability to avoid or minimize complications of infection will improve Outcome: Adequate for Discharge Goal: Ability to achieve and maintain urine output will improve Outcome: Adequate for Discharge

## 2019-06-03 NOTE — Discharge Summary (Signed)
[facility] Discharge Summary  Admit date: 06/01/2019  Discharge date and time: 06/03/19   Discharge to: Home  Discharge Service: Urology  Discharge Attending Physician:  Dr. Ellison Hughs  Discharge  Diagnoses: LEFT renal mass  Secondary Diagnosis: Active Problems:   Renal mass   OR Procedures: Procedure(s): XI ROBOTIC ASSITED LEFT PARTIAL NEPHRECTOMY 06/01/2019   Ancillary Procedures: None   Discharge Day Services: The patient was seen and examined by the Urology team both in the morning and immediately prior to discharge.  Vital signs and laboratory values were stable and within normal limits.  The physical exam was benign and unchanged and all surgical wounds were examined.  Discharge instructions were explained and all questions answered.  Subjective  No acute events overnight. Pain Controlled. No fever or chills.  Objective Patient Vitals for the past 8 hrs:  BP Temp Pulse Resp SpO2  06/03/19 0749 -- -- -- -- 95 %  06/03/19 0747 -- -- -- -- 95 %  06/03/19 0600 117/73 98.7 F (37.1 C) (!) 56 18 96 %   Total I/O In: -  Out: 300 [Urine:300]  General Appearance:        No acute distress Lungs:                       Normal work of breathing on room air Heart:                                Regular rate and rhythm Abdomen:                         Soft, non-tender, non-distended Extremities:                      Warm and well perfused   Hospital Course:  The patient underwent left partial nephrectomy on 06/01/2019.  The patient tolerated the procedure well, was extubated in the OR, and afterwards was taken to the PACU for routine post-surgical care. When stable the patient was transferred to the floor.   The patient did well postoperatively.  The patient's diet was slowly advanced and at the time of discharge was tolerating a regular diet.  The patient was discharged home 2 Days Post-Op, at which point was tolerating a regular solid diet, was able to void  spontaneously, have adequate pain control with P.O. pain medication, and could ambulate without difficulty. The patient will follow up with [place] for post op check.   Condition at Discharge: Improved  Discharge Medications:  Allergies as of 06/03/2019   No Known Allergies     Medication List    STOP taking these medications   ibuprofen 800 MG tablet Commonly known as: ADVIL     TAKE these medications   acetaminophen 500 MG tablet Commonly known as: TYLENOL Take 500 mg by mouth every 6 (six) hours as needed for mild pain or headache.   albuterol 108 (90 Base) MCG/ACT inhaler Commonly known as: Proventil HFA INHALE 2 PUFFS BY MOUTH EVERY 6 HOURS AS NEEDED FOR WHEEZING OR SHORTNESS OF BREATH What changed:   how much to take  how to take this  when to take this  reasons to take this  additional instructions   Anoro Ellipta 62.5-25 MCG/INH Aepb Generic drug: umeclidinium-vilanterol Inhale 1 puff by mouth once daily   Azelastine HCl 137 MCG/SPRAY Soln USE 2 SPRAY(S)  IN EACH NOSTRIL TWICE DAILY AS NEEDED FOR RHINITIS What changed: See the new instructions.   azithromycin 250 MG tablet Commonly known as: ZITHROMAX Take 2 tablets (500mg  total) today,then 1 tablet (250mg ) x4 days   buPROPion 300 MG 24 hr tablet Commonly known as: Wellbutrin XL Take 1 tablet (300 mg total) by mouth daily.   cycloSPORINE 0.05 % ophthalmic emulsion Commonly known as: RESTASIS Place 1 drop into both eyes See admin instructions. Instill 1 drop into both eyes (scheduled) in the morning, may use an additional dose if needed for dry/irritated eyes.   docusate sodium 100 MG capsule Commonly known as: COLACE Take 1 capsule (100 mg total) by mouth 2 (two) times daily.   eszopiclone 1 MG Tabs tablet Commonly known as: Lunesta Take 1 tablet (1 mg total) by mouth at bedtime as needed for sleep. Take immediately before bedtime   guaiFENesin 600 MG 12 hr tablet Commonly known as: MUCINEX Take 600  mg by mouth 2 (two) times daily as needed (wheezing/cough).   HYDROcodone-acetaminophen 5-325 MG tablet Commonly known as: Norco Take 1-2 tablets by mouth every 6 (six) hours as needed for moderate pain or severe pain.   nicotine 21 mg/24hr patch Commonly known as: NICODERM CQ - dosed in mg/24 hours Place 1 patch (21 mg total) onto the skin daily.   omeprazole 40 MG capsule Commonly known as: PRILOSEC Take 1 capsule by mouth once daily What changed: when to take this   OXYGEN Inhale 2 L into the lungs at bedtime.   pregabalin 75 MG capsule Commonly known as: LYRICA Take 75 mg by mouth daily.   promethazine 12.5 MG tablet Commonly known as: PHENERGAN Take 1 tablet (12.5 mg total) by mouth every 4 (four) hours as needed for nausea or vomiting.   tamsulosin 0.4 MG Caps capsule Commonly known as: FLOMAX Take 0.4 mg by mouth daily.   temazepam 30 MG capsule Commonly known as: RESTORIL Take 1 capsule (30 mg total) by mouth at bedtime as needed for sleep.

## 2019-06-07 LAB — SURGICAL PATHOLOGY

## 2019-06-23 ENCOUNTER — Telehealth: Payer: Self-pay | Admitting: Emergency Medicine

## 2019-06-23 DIAGNOSIS — G4734 Idiopathic sleep related nonobstructive alveolar hypoventilation: Secondary | ICD-10-CM

## 2019-06-23 NOTE — Telephone Encounter (Signed)
He would to get get some samples as he is currently out the inhaler. Also stated that the oxygen concentrator is not properly working and he states that it makes loud noise and knocks periodically throughout the night

## 2019-06-23 NOTE — Telephone Encounter (Signed)
ATC several times to call the pt's number, the call would not connect. Even tried calling pt from my personal phone, call still would not connect. Will try back.

## 2019-06-24 ENCOUNTER — Telehealth: Payer: Self-pay | Admitting: Pharmacist

## 2019-06-24 DIAGNOSIS — Z720 Tobacco use: Secondary | ICD-10-CM

## 2019-06-24 MED ORDER — NICOTINE 14 MG/24HR TD PT24: 14.0000 mg | MEDICATED_PATCH | Freq: Every day | TRANSDERMAL | 2 refills | Status: DC

## 2019-06-24 NOTE — Telephone Encounter (Signed)
ATC, NA and no VM  

## 2019-06-24 NOTE — Telephone Encounter (Signed)
Received message from front desk stating that patient had questions regarding nicotine patch.  Called to follow-up with patient.  Patient states that he is experiencing nausea and believes it to be due to the nicotine patch.  He is currently taking 21 mg and wears for 12 hours.  He states he was able to quit down to 1 cigarette daily and quit once he had surgery on April 28.  He has not smoked a cigarette since.  He still feels like he has the urge or cravings and would like to continue the patch.  Congratulated patient on successfully quitting smoking.  Advised nicotine patch can cause nausea.  Since he has decreased the amount of cigarettes he smokes significantly to 21 mg dose may be too much.  Recommended decreasing to 14 mg patch and if he still experiences nausea we can decrease to the 7 mg patch. Patient verbalized understanding.  Prescription sent to local pharmacy.  Instructed patient to call with any further questions or concerns.   Mariella Saa, PharmD, [place], CPP Clinical Specialty Pharmacist (Rheumatology and Pulmonology)  06/24/2019 3:25 PM

## 2019-06-27 NOTE — Telephone Encounter (Signed)
Spoke with the pt and he states needing PA for Anoro  He states pharm faxed over request this am  He also states that the o2 concentrator is making a loud sound- send order to [facility] to get someone from [employer] to come service this  Checked fax and did not see PA request  [facility] and they are closed until 2 pm  [facility]

## 2019-06-27 NOTE — Telephone Encounter (Signed)
Pt called back about this. Would like a call back and would like [place] to send prior authorization to pharmacy.   Pt can be reached at [phone]

## 2019-06-29 NOTE — Telephone Encounter (Signed)
Spoke with patient regarding f/u on medication Anoro. Advised patient  I called his pharmacy and his medication needs a PA.[facility] stated they had fax the PA a few times I asked [facility] to fax it to our office [phone].Let patient know our office will be waiting for the fax. Patient will be contacted after PA started.

## 2019-07-01 NOTE — Telephone Encounter (Signed)
Fax was received stating that PA needed to be initiated. Called PA # at [phone] to intitiate PA with [place] [facility]. Spoke with Patty and initiated the PA. Pt's anoro has been approved from 07/01/2019-06/25/2020. PA reference #: [id number]  Called and spoke with pt letting him know this had been done and he verbalized understanding. Nothing further needed.

## 2019-07-20 ENCOUNTER — Other Ambulatory Visit: Payer: Self-pay | Admitting: Emergency Medicine

## 2019-07-20 ENCOUNTER — Other Ambulatory Visit: Payer: Self-pay | Admitting: Family Medicine

## 2019-07-20 DIAGNOSIS — M549 Dorsalgia, unspecified: Secondary | ICD-10-CM

## 2019-07-21 ENCOUNTER — Other Ambulatory Visit: Payer: Self-pay | Admitting: Family Medicine

## 2019-07-21 ENCOUNTER — Other Ambulatory Visit: Payer: Self-pay | Admitting: Surgery

## 2019-07-21 ENCOUNTER — Ambulatory Visit
Admission: RE | Admit: 2019-07-21 | Discharge: 2019-07-21 | Disposition: A | Discharge status: Home / Self Care | Payer: Medicaid Other | Source: Ambulatory Visit | Attending: Family Medicine | Admitting: Family Medicine

## 2019-07-21 DIAGNOSIS — M549 Dorsalgia, unspecified: Secondary | ICD-10-CM

## 2019-07-21 DIAGNOSIS — C3490 Malignant neoplasm of unspecified part of unspecified bronchus or lung: Secondary | ICD-10-CM

## 2019-08-05 ENCOUNTER — Other Ambulatory Visit: Payer: Self-pay

## 2019-08-05 ENCOUNTER — Encounter (HOSPITAL_COMMUNITY): Payer: Self-pay

## 2019-08-05 ENCOUNTER — Emergency Department (HOSPITAL_COMMUNITY)
Admission: EM | Admit: 2019-08-05 | Discharge: 2019-08-06 | Disposition: A | Discharge status: Home / Self Care | Payer: Medicaid Other | Attending: Emergency Medicine | Admitting: Emergency Medicine

## 2019-08-05 DIAGNOSIS — I2510 Atherosclerotic heart disease of native coronary artery without angina pectoris: Secondary | ICD-10-CM | POA: Insufficient documentation

## 2019-08-05 DIAGNOSIS — J449 Chronic obstructive pulmonary disease, unspecified: Secondary | ICD-10-CM | POA: Insufficient documentation

## 2019-08-05 DIAGNOSIS — Z20822 Contact with and (suspected) exposure to covid-19: Secondary | ICD-10-CM | POA: Insufficient documentation

## 2019-08-05 DIAGNOSIS — R1084 Generalized abdominal pain: Secondary | ICD-10-CM | POA: Diagnosis present

## 2019-08-05 DIAGNOSIS — R112 Nausea with vomiting, unspecified: Secondary | ICD-10-CM | POA: Insufficient documentation

## 2019-08-05 DIAGNOSIS — R197 Diarrhea, unspecified: Secondary | ICD-10-CM | POA: Insufficient documentation

## 2019-08-05 DIAGNOSIS — F17210 Nicotine dependence, cigarettes, uncomplicated: Secondary | ICD-10-CM | POA: Diagnosis not present

## 2019-08-05 DIAGNOSIS — Z79899 Other long term (current) drug therapy: Secondary | ICD-10-CM | POA: Diagnosis not present

## 2019-08-05 LAB — CBC
HCT: 44.8 % (ref 39.0–52.0)
Hemoglobin: 14.5 g/dL (ref 13.0–17.0)
MCH: 30.6 pg (ref 26.0–34.0)
MCHC: 32.4 g/dL (ref 30.0–36.0)
MCV: 94.5 fL (ref 80.0–100.0)
Platelets: 268 10*3/uL (ref 150–400)
RBC: 4.74 MIL/uL (ref 4.22–5.81)
RDW: 14.1 % (ref 11.5–15.5)
WBC: 9.4 10*3/uL (ref 4.0–10.5)
nRBC: 0 % (ref 0.0–0.2)

## 2019-08-05 LAB — URINALYSIS, ROUTINE W REFLEX MICROSCOPIC
Bacteria, UA: NONE SEEN
Bilirubin Urine: NEGATIVE
Glucose, UA: NEGATIVE mg/dL
Hgb urine dipstick: NEGATIVE
Ketones, ur: NEGATIVE mg/dL
Leukocytes,Ua: NEGATIVE
Nitrite: NEGATIVE
Protein, ur: 30 mg/dL — AB
Specific Gravity, Urine: 1.029 (ref 1.005–1.030)
pH: 5 (ref 5.0–8.0)

## 2019-08-05 LAB — COMPREHENSIVE METABOLIC PANEL
ALT: 16 U/L (ref 0–44)
AST: 19 U/L (ref 15–41)
Albumin: 4 g/dL (ref 3.5–5.0)
Alkaline Phosphatase: 91 U/L (ref 38–126)
Anion gap: 12 (ref 5–15)
BUN: 15 mg/dL (ref 8–23)
CO2: 25 mmol/L (ref 22–32)
Calcium: 9.2 mg/dL (ref 8.9–10.3)
Chloride: 103 mmol/L (ref 98–111)
Creatinine, Ser: 1.22 mg/dL (ref 0.61–1.24)
GFR calc Af Amer: >60 mL/min (ref >60)
GFR calc non Af Amer: >60 mL/min (ref >60)
Glucose, Bld: 96 mg/dL (ref 70–99)
Potassium: 3.6 mmol/L (ref 3.5–5.1)
Sodium: 140 mmol/L (ref 135–145)
Total Bilirubin: 1.5 mg/dL — ABNORMAL HIGH (ref 0.3–1.2)
Total Protein: 7.1 g/dL (ref 6.5–8.1)

## 2019-08-05 LAB — LIPASE, BLOOD: Lipase: 20 U/L (ref 11–51)

## 2019-08-05 MED ORDER — SODIUM CHLORIDE 0.9 % IV SOLN: Freq: Once | INTRAVENOUS | Status: AC

## 2019-08-05 MED ORDER — ONDANSETRON 4 MG PO TBDP
4.0000 mg | ORAL_TABLET | Freq: Once | ORAL | Status: AC | PRN
  Administered 2019-08-05: 4 mg via ORAL
  Filled 2019-08-05: qty 1

## 2019-08-05 MED ORDER — SODIUM CHLORIDE 0.9% FLUSH
3.0000 mL | Freq: Once | INTRAVENOUS | Status: AC
  Administered 2019-08-06: 3 mL via INTRAVENOUS

## 2019-08-05 NOTE — ED Triage Notes (Signed)
Pt arrives to ED w/ c/o headache, n/v/d, abdominal pain, and 5/10 back pain that started this morning. Pt describes pain as dull tight pain. Pt denies urinary symptoms. Pt has hx of lung cancer, and COPD. Wears 2 lpm o2 via [employer].

## 2019-08-05 NOTE — ED Notes (Signed)
Triage RN made aware of pts BP change. Pt was laying down with BP 83/51 pt sat up BP was 93/69.

## 2019-08-06 ENCOUNTER — Encounter (HOSPITAL_COMMUNITY): Payer: Self-pay | Admitting: Emergency Medicine

## 2019-08-06 ENCOUNTER — Emergency Department (HOSPITAL_COMMUNITY): Payer: Medicaid Other

## 2019-08-06 LAB — SARS CORONAVIRUS 2 BY RT PCR (HOSPITAL ORDER, PERFORMED IN ~~LOC~~ HOSPITAL LAB): SARS Coronavirus 2: NEGATIVE

## 2019-08-06 MED ORDER — IOHEXOL 300 MG/ML  SOLN
100.0000 mL | Freq: Once | INTRAMUSCULAR | Status: AC | PRN
  Administered 2019-08-06: 100 mL via INTRAVENOUS

## 2019-08-06 MED ORDER — KETOROLAC TROMETHAMINE 30 MG/ML IJ SOLN
30.0000 mg | Freq: Once | INTRAMUSCULAR | Status: AC
  Administered 2019-08-06: 30 mg via INTRAVENOUS
  Filled 2019-08-06: qty 1

## 2019-08-06 MED ORDER — ONDANSETRON 8 MG PO TBDP
ORAL_TABLET | ORAL | 0 refills | Status: DC
Start: 2019-08-06 — End: 2019-09-12
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
## 2019-08-06 NOTE — ED Notes (Signed)
Pt given water for PO challenge, pt able to tolerate.

## 2019-08-06 NOTE — ED Notes (Signed)
Pt transported to CT ?

## 2019-08-17 ENCOUNTER — Ambulatory Visit
Admission: RE | Admit: 2019-08-17 | Discharge: 2019-08-17 | Disposition: A | Discharge status: Home / Self Care | Payer: Medicaid Other | Source: Ambulatory Visit | Attending: Surgery | Admitting: Surgery

## 2019-08-17 ENCOUNTER — Ambulatory Visit (INDEPENDENT_AMBULATORY_CARE_PROVIDER_SITE_OTHER): Payer: Medicaid Other | Admitting: Surgery

## 2019-08-17 ENCOUNTER — Other Ambulatory Visit: Payer: Self-pay

## 2019-08-17 ENCOUNTER — Encounter: Payer: Self-pay | Admitting: Surgery

## 2019-08-17 VITALS — BP 156/90 | HR 52 | Temp 97.7°F | Resp 20 | Ht 68.0 in | Wt 153.0 lb

## 2019-08-17 DIAGNOSIS — Z902 Acquired absence of lung [part of]: Secondary | ICD-10-CM | POA: Diagnosis not present

## 2019-08-17 DIAGNOSIS — Z85118 Personal history of other malignant neoplasm of bronchus and lung: Secondary | ICD-10-CM | POA: Diagnosis not present

## 2019-08-17 DIAGNOSIS — C3490 Malignant neoplasm of unspecified part of unspecified bronchus or lung: Secondary | ICD-10-CM

## 2019-08-17 NOTE — Progress Notes (Signed)
HPI:  Patient returns for routinesurveillancehaving undergoneRight VATS with wedge resection of a 1.4 cm RUL lung noduleon 07/03/2016. The final pathology showed a poorly differentiated adenocarcinoma with negative surgical margins.This is his second lung cancer resection having undergoneleft upper lobectomy on 06/12/2011 for a stage IB (T2A, N0, M0) non-small cell lung cancer consistent with adenocarcinoma. This was a 2.0 cm tumor with visceral pleural invasion. He was found to have a small left renal lesion on abdominal ultrasound in September done for right upper quadrant and epigastric pain.  CT scan of the abdomen confirmed a 2.1 x 1.3 cm lesion in the upper aspect of the left kidney concerning for a small renal neoplasm.  He subsequently underwent an MRI of the abdomen which confirmed a 2.5 cm mass in the upper pole left kidney suspicious for renal cell carcinoma.  He underwent robotic assisted laparoscopic left partial nephrectomy by Dr. Lovena Neighbours on 05/24/2019.  His final pathology showed renal cell carcinoma with negative margins.  He has chronic back pain with history of degenerative spine disease and prior surgery on his cervical spine.  He had thoracic spine x-rays done on 07/21/2019 which showed mild degenerative changes in the thoracic spine most notable at T2-3.  He is scheduled for a thoracic spine MRI on 08/20/2019.  He continues to have chronic exertional dyspnea with prior PFTs showing severe obstructive lung disease and severe reduction in diffusion capacity.  He said that he quit smoking at the time of his kidney surgery on 06/01/2019.  He has continued to abstain from smoking.  He was using a nicotine patch but began developing severe headaches.  This improved with reduction in the nicotine patch from 21 to 14 mg daily.  He stopped them completely a few days ago and his headaches are better.  Current Outpatient Medications  Medication Sig Dispense Refill  . acetaminophen (TYLENOL)  500 MG tablet Take 500 mg by mouth every 6 (six) hours as needed for mild pain or headache.     . albuterol (PROVENTIL HFA) 108 (90 Base) MCG/ACT inhaler INHALE 2 PUFFS BY MOUTH EVERY 6 HOURS AS NEEDED FOR WHEEZING OR SHORTNESS OF BREATH (Patient taking differently: Inhale 2 puffs into the lungs every 6 (six) hours as needed (wheezing/shortness of breath.). ) 18 g 3  . Azelastine HCl 137 MCG/SPRAY SOLN USE 2 SPRAY(S) IN EACH NOSTRIL TWICE DAILY AS NEEDED FOR RHINITIS (Patient taking differently: Place 2 sprays into the nose 2 (two) times daily as needed (Rhinitis). ) 30 mL 0  . azithromycin (ZITHROMAX) 250 MG tablet Take 2 tablets (500mg  total) today,then 1 tablet (250mg ) x4 days 6 tablet 0  . buPROPion (WELLBUTRIN XL) 300 MG 24 hr tablet Take 1 tablet (300 mg total) by mouth daily. 30 tablet 11  . chlorhexidine (PERIDEX) 0.12 % solution 15 mLs by Mouth Rinse route 2 (two) times daily.    . cycloSPORINE (RESTASIS) 0.05 % ophthalmic emulsion Place 1 drop into both eyes See admin instructions. Instill 1 drop into both eyes (scheduled) in the morning, may use an additional dose if needed for dry/irritated eyes.    [name] docusate sodium (COLACE) 100 MG capsule Take 1 capsule (100 mg total) by mouth 2 (two) times daily.    [name] escitalopram (LEXAPRO) 10 MG tablet Take 10 mg by mouth daily. Has not started    . eszopiclone (LUNESTA) 1 MG TABS tablet Take 1 tablet (1 mg total) by mouth at bedtime as needed for sleep. Take immediately before bedtime  30 tablet 0  . guaiFENesin (MUCINEX) 600 MG 12 hr tablet Take 600 mg by mouth 2 (two) times daily as needed (wheezing/cough).    [name] HYDROcodone-acetaminophen (NORCO) 5-325 MG tablet Take 1-2 tablets by mouth every 6 (six) hours as needed for moderate pain or severe pain. 20 tablet 0  . nicotine (NICODERM CQ - DOSED IN MG/24 HOURS) 14 mg/24hr patch Place 1 patch (14 mg total) onto the skin daily. 28 patch 2  . omeprazole (PRILOSEC) 20 MG capsule Take 40 mg by mouth daily.      [name] omeprazole (PRILOSEC) 40 MG capsule Take 1 capsule by mouth once daily 30 capsule 2  . ondansetron (ZOFRAN ODT) 8 MG disintegrating tablet 8mg  ODT q8 hours prn nausea 4 tablet 0  . ondansetron (ZOFRAN-ODT) 8 MG disintegrating tablet Place 8 mg inside cheek daily as needed for nausea/vomiting.    . OXYGEN Inhale 2 L into the lungs at bedtime.    . pregabalin (LYRICA) 75 MG capsule Take 75 mg by mouth daily.    . promethazine (PHENERGAN) 12.5 MG tablet Take 1 tablet (12.5 mg total) by mouth every 4 (four) hours as needed for nausea or vomiting. 15 tablet 0  . tamsulosin (FLOMAX) 0.4 MG CAPS capsule Take 0.4 mg by mouth daily.    . temazepam (RESTORIL) 30 MG capsule Take 1 capsule (30 mg total) by mouth at bedtime as needed for sleep. 30 capsule 0  . umeclidinium-vilanterol (ANORO ELLIPTA) 62.5-25 MCG/INH AEPB Inhale 1 puff by mouth once daily (Patient taking differently: Inhale 1 puff into the lungs daily. ) 90 each 3   No current facility-administered medications for this visit.     Physical Exam: BP (!) 156/90   Pulse (!) 52   Temp 97.7 F (36.5 C) (Skin)   Resp 20   Ht 5\' 8"  (1.727 m)   Wt 153 lb (69.4 kg)   SpO2 90% Comment: RA  BMI 23.26 kg/m  He looks well. There is no cervical or supraclavicular adenopathy. Lung exam is clear with markedly decreased breath sounds throughout. Cardiac exam shows a regular rate and rhythm with normal heart sounds.  Diagnostic Tests:  Narrative & Impression  CLINICAL DATA:  Lung cancer.  EXAM: CT CHEST WITHOUT CONTRAST  TECHNIQUE: Multidetector CT imaging of the chest was performed following the standard protocol without IV contrast.  COMPARISON:  CT abdomen pelvis 08/06/2019 and CT chest 08/25/2018.  FINDINGS: Cardiovascular: Atherosclerotic calcification of the aorta and coronary arteries. Heart size normal. No pericardial effusion.  Mediastinum/Nodes: Mediastinal lymph nodes are not enlarged by CT size criteria. Hilar  regions are difficult to evaluate without IV contrast. No axillary adenopathy. Esophagus is unremarkable.  Lungs/Pleura: Wedge resection the medial right upper lobe and left upper lobectomy. Moderate centrilobular emphysema. Volume loss in the medial segment right middle lobe, unchanged. Lungs are otherwise clear. No pleural fluid. Debris is seen dependently in the right mainstem bronchus. Airway is otherwise unremarkable.  Upper Abdomen: Visualized portion of the liver is unremarkable. There may be slight thickening of the medial limb right adrenal gland. Left adrenal gland and visualized portion of the right kidney are unremarkable. Postoperative changes in the left kidney. Visualized portions of the spleen, pancreas, stomach and bowel are unremarkable.  Musculoskeletal: Degenerative changes in the spine. No worrisome lytic or sclerotic lesions.  IMPRESSION: 1. No evidence of recurrent metastatic disease. 2. Aortic atherosclerosis (ICD10-I70.0). Coronary artery calcification. 3.  Emphysema (ICD10-J43.9).   Electronically Signed   By: Lorin Picket  M.D.   On: 08/17/2019 14:59      Impression:  He continues to do well 3 years following wedge resection of a right upper lobe lung cancer.  His current CT scan shows no evidence of recurrent disease or metastasis.  He said that he has not smoked since April when he had a partial nephrectomy for renal cell carcinoma.  I encouraged him to continue abstaining from cigarettes.  I reviewed the CT images with him and answered his questions.  Plan:  I will see him back in 6 months with a chest x-ray and plan to repeat his CT scan in 1 year.  I spent 20 minutes performing this established patient evaluation and > 50% of this time was spent face to face counseling and coordinating the surveillance of this patient's resected lung cancer.    Gaye Pollack, MD [facility] [phone]

## 2019-08-20 ENCOUNTER — Ambulatory Visit
Admission: RE | Admit: 2019-08-20 | Discharge: 2019-08-20 | Disposition: A | Discharge status: Home / Self Care | Payer: Medicaid Other | Source: Ambulatory Visit | Attending: Family Medicine | Admitting: Family Medicine

## 2019-08-20 DIAGNOSIS — M549 Dorsalgia, unspecified: Secondary | ICD-10-CM

## 2019-09-01 ENCOUNTER — Telehealth: Payer: Self-pay | Admitting: Emergency Medicine

## 2019-09-01 NOTE — Telephone Encounter (Signed)
ATC patient.  LMTCB. 

## 2019-09-05 NOTE — Telephone Encounter (Signed)
ATC patient LMTCB X2 he is scheduled with Aaron Edelman on 09/12/19

## 2019-09-06 NOTE — Telephone Encounter (Signed)
LMTCB x3 for pt. We have attempted to contact pt several times with no success or call back from pt. Per triage protocol, message will be closed.   

## 2019-09-08 DIAGNOSIS — Z736 Limitation of activities due to disability: Secondary | ICD-10-CM

## 2019-09-12 ENCOUNTER — Encounter: Payer: Self-pay | Admitting: Pulmonary Disease

## 2019-09-12 ENCOUNTER — Ambulatory Visit (INDEPENDENT_AMBULATORY_CARE_PROVIDER_SITE_OTHER): Payer: Medicaid Other | Admitting: Pulmonary Disease

## 2019-09-12 ENCOUNTER — Other Ambulatory Visit: Payer: Self-pay

## 2019-09-12 VITALS — BP 112/72 | HR 73 | Temp 97.9°F | Ht 68.0 in | Wt 149.4 lb

## 2019-09-12 DIAGNOSIS — K219 Gastro-esophageal reflux disease without esophagitis: Secondary | ICD-10-CM | POA: Diagnosis not present

## 2019-09-12 DIAGNOSIS — Z87891 Personal history of nicotine dependence: Secondary | ICD-10-CM

## 2019-09-12 DIAGNOSIS — C3490 Malignant neoplasm of unspecified part of unspecified bronchus or lung: Secondary | ICD-10-CM | POA: Diagnosis not present

## 2019-09-12 DIAGNOSIS — J449 Chronic obstructive pulmonary disease, unspecified: Secondary | ICD-10-CM

## 2019-09-12 MED ORDER — BREZTRI AEROSPHERE 160-9-4.8 MCG/ACT IN AERO: 2.0000 | INHALATION_SPRAY | Freq: Two times a day (BID) | RESPIRATORY_TRACT | 0 refills | Status: DC

## 2019-09-12 MED ORDER — OMEPRAZOLE 40 MG PO CPDR: 40.0000 mg | DELAYED_RELEASE_CAPSULE | Freq: Every day | ORAL | 2 refills | Status: DC

## 2019-09-12 NOTE — Assessment & Plan Note (Signed)
Plan: Keep follow-up with [facility] Follow-up CT imaging as ordered by [facility]
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
## 2019-09-12 NOTE — Patient Instructions (Addendum)
You were seen today by Lauraine Rinne, NP  for:   1. Chronic obstructive pulmonary disease, unspecified COPD type ([place])  - Pulmonary function test; Future  Walk today in office  Trial of Breztri  >>> 2 puffs in the morning right when you wake up, rinse out your mouth after use, 12 hours later 2 puffs, rinse after use >>> Take this daily, no matter what >>> This is not a rescue inhaler   Please call us in 1 to 2 weeks and let us know how you are doing when you finish the first sample.  If you are tolerating this medication well please let our office know so we can send in a prescription.  Stop Anoro Ellipta  Only use your albuterol as a rescue medication to be used if you can't catch your breath by resting or doing a relaxed purse lip breathing pattern.  - The less you use it, the better it will work when you need it. - Ok to use up to 2 puffs  every 4 hours if you must but call for immediate appointment if use goes up over your usual need - Don't leave home without it !!  (think of it like the spare tire for your car)   Note your daily symptoms > remember "red flags" for COPD:   >>>Increase in cough >>>increase in sputum production >>>increase in shortness of breath or activity  intolerance.   If you notice these symptoms, please call the office to be seen.   We will repeat a pulmonary function test  2. Malignant neoplasm of lung, unspecified laterality, unspecified part of lung ([place]) 3. Former cigarette smoker  Continue not smoke  Continue follow-up with Dr. Cyndia Bent  Continue CT imaging as ordered by Dr. Cyndia Bent   4. Laryngopharyngeal reflux (LPR) 5. Gastroesophageal reflux disease, unspecified whether esophagitis present  - Ambulatory referral to Gastroenterology  Omeprazole 40 mg tablet  >>>Please take 1 tablet daily 15 minutes to 30 minutes before your first meal of the day as well as before your other medications >>>Try to take at the same time each day >>>take this  medication daily  GERD management: >>>Avoid laying flat until 2 hours after meals >>>Elevate head of the bed including entire chest >>>Reduce size of meals and amount of fat, acid, spices, caffeine and sweets >>>If you are smoking, Please stop! >>>Decrease alcohol consumption >>>Work on maintaining a healthy weight with normal BMI   We will refer you to gastroenterology for further evaluation of acid reflux management as well as your ongoing nausea/vomiting   We recommend today:  Orders Placed This Encounter  Procedures  . Ambulatory referral to Gastroenterology    Referral Priority:   Routine    Referral Type:   Consultation    Referral Reason:   Specialty Services Required    Number of Visits Requested:   1  . Pulmonary function test    Standing Status:   Future    Standing Expiration Date:   09/11/2020    Order Specific Question:   Where should this test be performed?    Answer:   [facility]    Order Specific Question:   Full PFT: includes the following: basic spirometry, spirometry pre & post bronchodilator, diffusion capacity (DLCO), lung volumes    Answer:   Full PFT   Orders Placed This Encounter  Procedures  . Ambulatory referral to Gastroenterology  . Pulmonary function test   Meds ordered this encounter  Medications  . omeprazole (  PRILOSEC) 40 MG capsule    Sig: Take 1 capsule (40 mg total) by mouth daily.    Dispense:  30 capsule    Refill:  2    Follow Up:    Return in about 6 weeks (around 10/24/2019), or if symptoms worsen or fail to improve, for Follow up with Wyn Quaker FNP-C, Follow up with Dr. Lamonte Sakai, Follow up for FULL PFT - 60 min.   Please do your part to reduce the spread of COVID-19:      Reduce your risk of any infection  and COVID19 by using the similar precautions used for avoiding the common cold or flu:  [name] Wash your hands often with soap and warm water for at least 20 seconds.  If soap and water are not readily available, use an  alcohol-based hand sanitizer with at least 60% alcohol.  . If coughing or sneezing, cover your mouth and nose by coughing or sneezing into the elbow areas of your shirt or coat, into a tissue or into your sleeve (not your hands). Langley Gauss A MASK when in public  . Avoid shaking hands with others and consider head nods or verbal greetings only. . Avoid touching your eyes, nose, or mouth with unwashed hands.  . Avoid close contact with people who are sick. . Avoid places or events with large numbers of people in one location, like concerts or sporting events. . If you have some symptoms but not all symptoms, continue to monitor at home and seek medical attention if your symptoms worsen. . If you are having a medical emergency, call 911.   [place] / e-Visit: [email or web address]         [facility]: [facility]: [phone]                   [facility]: [phone]     It is flu season:   >>> Best ways to protect herself from the flu: Receive the yearly flu vaccine, practice good hand hygiene washing with soap and also using hand sanitizer when available, eat a nutritious meals, get adequate rest, hydrate appropriately   Please contact the office if your symptoms worsen or you have concerns that you are not improving.   Thank you for choosing [facility] for your healthcare, and for allowing [place] to partner with you on your healthcare journey. I am thankful to be able to provide care to you today.   Wyn Quaker FNP-C

## 2019-09-12 NOTE — Assessment & Plan Note (Signed)
Occasional hoarseness Continue daily breakthrough reflux despite PPI use No formal gastroenterology eval  Plan: Continue omeprazole 40 mg We will refer to gastroenterology

## 2019-09-12 NOTE — Assessment & Plan Note (Addendum)
>>  ASSESSMENT AND PLAN FOR GERD (GASTROESOPHAGEAL REFLUX DISEASE) WRITTEN ON 09/12/2019  4:33 PM BY San Lohmeyer P, NP  Plan: Continue omeprazole  40 mg We will refer to gastroenterology today   >>ASSESSMENT AND PLAN FOR LARYNGOPHARYNGEAL REFLUX (LPR) WRITTEN ON 09/12/2019  4:31 PM BY Koralyn Prestage P, NP  Occasional hoarseness Continue daily breakthrough reflux despite PPI use No formal gastroenterology eval  Plan: Continue omeprazole  40 mg We will refer to gastroenterology

## 2019-09-12 NOTE — Assessment & Plan Note (Signed)
Reviewed pulmonary function testing with patient Former smoker No previous eosinophil count on file Walk today in office stable  Plan: Trial of Breztri  Walk today in office We will repeat pulmonary function testing Follow-up in 4 to 6 weeks Continue to not smoke Continue CT imaging follow-up as managed by CV[facility]

## 2019-09-12 NOTE — Assessment & Plan Note (Signed)
Plan: Continue to not smoke Pulmonary function testing ordered

## 2019-09-21 ENCOUNTER — Ambulatory Visit: Payer: Medicaid Other | Admitting: Pulmonary Disease

## 2019-10-07 ENCOUNTER — Telehealth: Payer: Self-pay | Admitting: Emergency Medicine

## 2019-10-07 MED ORDER — PREDNISONE 10 MG PO TABS
ORAL_TABLET | ORAL | 0 refills | Status: DC
Start: 2019-10-07 — End: 2019-11-15

## 2019-10-07 NOTE — Telephone Encounter (Signed)
If he is having chest pains then needs to go to ER for evaluation . So please advise.    Sound like a COPD flare, if no chest pains then can have :  Will send in Prednisone taper over next week .  Continue on BREZTRI 2 puff Twice daily  Given at last ov with [facility] NP  If not improving or worsens will need to be seen in the urgent care or emergency room. Patient advised to follow-up in the office as recommended last visit and or sooner.  Please contact office for sooner follow up if symptoms do not improve or worsen or seek emergency care

## 2019-10-07 NOTE — Telephone Encounter (Signed)
Spoke with pt, c/o nonprod cough, sore throat, chest tightness, sob X1 week.  Denies fever, sharp chest pains, headache, loss of taste/smell. No known covid exposure  Has been gargling with salt water, using albuterol q6h, using breztri daily.  Recommending additional recs.   Pharmacy: [facility]   TP please advise on recs, thanks

## 2019-10-07 NOTE — Telephone Encounter (Signed)
Pt again clarified that he has chest tightness with exertion but no sharp chest pains.  Made pt aware of recs.  Nothing further needed at this time- will close encounter.

## 2019-10-11 ENCOUNTER — Telehealth: Payer: Self-pay | Admitting: Emergency Medicine

## 2019-10-11 MED ORDER — BREZTRI AEROSPHERE 160-9-4.8 MCG/ACT IN AERO: 2.0000 | INHALATION_SPRAY | Freq: Two times a day (BID) | RESPIRATORY_TRACT | 6 refills | Status: DC

## 2019-10-11 NOTE — Telephone Encounter (Signed)
Spoke with pt and he feels that Judithann Sauger is helping and would like rx sent to pharmacy. Rx sent. Nothing further needed at this time.

## 2019-10-12 ENCOUNTER — Telehealth: Payer: Self-pay | Admitting: Pulmonary Disease

## 2019-10-12 NOTE — Telephone Encounter (Signed)
Called and spoke with pt who stated that insurance will not cover Breztri. Pt wants to know which inhaler he might be able to switch to in its place.  Routing to pharmacy team for them to do investigation and also routing this to Dr. Lamonte Sakai. Please advise.

## 2019-10-13 MED ORDER — ANORO ELLIPTA 62.5-25 MCG/INH IN AEPB: 1.0000 | INHALATION_SPRAY | Freq: Every day | RESPIRATORY_TRACT | 5 refills | Status: DC

## 2019-10-13 NOTE — Telephone Encounter (Signed)
Called and spoke with pt letting him know the info found out by pharmacy team and also info stated by RB and he verbalized understanding. Pt stated he would go back on Anoro. Verified preferred pharmacy and sent  Rx to pharmacy for pt.nothing further needed

## 2019-10-13 NOTE — Telephone Encounter (Signed)
Breztri and Trelegy will require a PA on patient's Managed Medicaid plan. Patient must have tried and failed 2 preferred: Advair, Dulera, Symbicort.

## 2019-10-13 NOTE — Telephone Encounter (Signed)
He was on Anoro before, was concerned about the cost. I don't want to change him to any of the alternatives that do not have a LAMA. Ask him if he would like to go back on the Anoro he was on originally.

## 2019-10-13 NOTE — Telephone Encounter (Signed)
After looking at pt's recent meds taken, pt has not tried/failed any of the preferred.  Dr. Jenetta Downer, please advise what you recommend as pt must have tried/failed 2 of the preferred of Advair, Dulera, Symbicort prior to Trelegy/Breztri PAs being approved if initiated.  We do not have records of pt being on any of the three inhalers listed above.

## 2019-10-26 ENCOUNTER — Ambulatory Visit: Payer: Medicaid Other | Admitting: Pulmonary Disease

## 2019-10-26 ENCOUNTER — Telehealth: Payer: Self-pay | Admitting: Emergency Medicine

## 2019-10-26 NOTE — Telephone Encounter (Signed)
Spoke with the pt and advised take pred in the am with food  Nothing further needed

## 2019-10-27 ENCOUNTER — Other Ambulatory Visit: Payer: Self-pay

## 2019-10-27 ENCOUNTER — Ambulatory Visit (INDEPENDENT_AMBULATORY_CARE_PROVIDER_SITE_OTHER): Payer: Medicaid Other | Admitting: Emergency Medicine

## 2019-10-27 ENCOUNTER — Encounter: Payer: Self-pay | Admitting: Emergency Medicine

## 2019-10-27 ENCOUNTER — Telehealth: Payer: Self-pay | Admitting: Emergency Medicine

## 2019-10-27 DIAGNOSIS — J449 Chronic obstructive pulmonary disease, unspecified: Secondary | ICD-10-CM

## 2019-10-27 LAB — PULMONARY FUNCTION TEST
DL/VA % pred: 29 %
DL/VA: 1.25 ml/min/mmHg/L
DLCO cor % pred: 26 %
DLCO cor: 6.99 ml/min/mmHg
DLCO unc % pred: 26 %
DLCO unc: 6.99 ml/min/mmHg
FEF 25-75 Post: 0.72 L/sec
FEF 25-75 Pre: 0.68 L/sec
FEF2575-%Change-Post: 5 %
FEF2575-%Pred-Post: 27 %
FEF2575-%Pred-Pre: 25 %
FEV1-%Change-Post: 2 %
FEV1-%Pred-Post: 56 %
FEV1-%Pred-Pre: 55 %
FEV1-Post: 1.65 L
FEV1-Pre: 1.61 L
FEV1FVC-%Change-Post: 0 %
FEV1FVC-%Pred-Pre: 58 %
FEV6-%Change-Post: 1 %
FEV6-%Pred-Post: 95 %
FEV6-%Pred-Pre: 94 %
FEV6-Post: 3.48 L
FEV6-Pre: 3.44 L
FEV6FVC-%Change-Post: 0 %
FEV6FVC-%Pred-Post: 100 %
FEV6FVC-%Pred-Pre: 101 %
FVC-%Change-Post: 1 %
FVC-%Pred-Post: 95 %
FVC-%Pred-Pre: 93 %
FVC-Post: 3.61 L
FVC-Pre: 3.54 L
Post FEV1/FVC ratio: 46 %
Post FEV6/FVC ratio: 96 %
Pre FEV1/FVC ratio: 46 %
Pre FEV6/FVC Ratio: 97 %
RV % pred: 102 %
RV: 2.33 L
TLC % pred: 96 %
TLC: 6.55 L

## 2019-10-27 NOTE — Assessment & Plan Note (Signed)
Continue Anoro 1 inhalation once daily for now. We will check with our pharmacist to see which inhaled medications are best covered by your insurance.  Based on this we may decide to make a change. Keep your albuterol available to use 2 puffs when you need it for shortness of breath, wheezing, chest tightness. Congratulations on stopping smoking.  This is great news. Continue your oxygen at 2 L/min while sleeping We will repeat your walking oximetry today to see if you qualify for oxygen during the daytime when you are exerting yourself Follow with Dr Lamonte Sakai in 3 months or sooner if you have any problems.

## 2019-10-27 NOTE — Patient Instructions (Addendum)
Continue Anoro 1 inhalation once daily for now. We will check with our pharmacist to see which inhaled medications are best covered by your insurance.  Based on this we may decide to make a change. Keep your albuterol available to use 2 puffs when you need it for shortness of breath, wheezing, chest tightness. Congratulations on stopping smoking.  This is great news. Continue your oxygen at 2 L/min while sleeping We will repeat your walking oximetry today to see if you qualify for oxygen during the daytime when you are exerting yourself Follow with Dr Lamonte Sakai in 3 months or sooner if you have any problems.
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
## 2019-10-27 NOTE — Telephone Encounter (Signed)
What Drug class of inhaler are we checking for? Does the provider want to switch from Anoro?

## 2019-10-27 NOTE — Progress Notes (Signed)
PFT done today. 

## 2019-10-27 NOTE — Addendum Note (Signed)
Addended by: Gavin Potters R on: 10/27/2019 03:17 PM   Modules accepted: Orders

## 2019-10-27 NOTE — Telephone Encounter (Signed)
Could you please run a test case to see which inhaler is covered on pt's formulary? Thank you

## 2019-10-28 ENCOUNTER — Encounter (HOSPITAL_COMMUNITY): Payer: Self-pay | Admitting: *Deleted

## 2019-10-28 NOTE — Progress Notes (Signed)
Received referral from Dr. Lamonte Sakai for this pt to participate in pulmonary rehab with the the diagnosis of COPD Stage 2.  Pt had PFT completed on 9/23 which showed Post pred FEV1 56.  Clinical review of pt follow up appt on 9/23 Pulmonary office note as well as other provider .  Pt with Covid Risk Score - 4. Pt appropriate for scheduling for Pulmonary rehab.  Will forward to support staff for scheduling and verification of insurance eligibility/benefits with pt consent. Cherre Huger, BSN Cardiac and Training and development officer

## 2019-10-28 NOTE — Telephone Encounter (Signed)
Per RB lava/lama or lava/lama ICS combo.

## 2019-11-03 ENCOUNTER — Telehealth: Payer: Self-pay | Admitting: Emergency Medicine

## 2019-11-03 NOTE — Telephone Encounter (Signed)
Spoke with patient regarding prior message. Patient had a few question's regarding oxygen .Patient was told that [employer] was bringing a at home concentrator . Patient asked about portable tanks . Advised patient I will contact [employer] to get the correct information.Was on hold with [employer] for 20 mins . Will try to call [employer] back ./

## 2019-11-04 NOTE — Telephone Encounter (Signed)
Sent message to [employer] requesting a status update.

## 2019-11-04 NOTE — Telephone Encounter (Signed)
Response from DME.    Skeet Latch sent to Melonie [place], [place]; [place], Izetta Dakin, [facility],   Yes ma-am the patient is scheduled to get set up on a homefill station. According to the manager because the patient has been on service with [place] for a while he does not qualify for a POC. The Rep will explain that again at set up.

## 2019-11-04 NOTE — Telephone Encounter (Signed)
Forwarding this to [facility] to check status of POC thanks

## 2019-11-11 ENCOUNTER — Ambulatory Visit (INDEPENDENT_AMBULATORY_CARE_PROVIDER_SITE_OTHER): Payer: Medicaid Other | Admitting: Gastroenterology

## 2019-11-11 ENCOUNTER — Encounter: Payer: Self-pay | Admitting: Gastroenterology

## 2019-11-11 VITALS — BP 116/70 | HR 50 | Ht 69.0 in | Wt 155.0 lb

## 2019-11-11 DIAGNOSIS — K2100 Gastro-esophageal reflux disease with esophagitis, without bleeding: Secondary | ICD-10-CM | POA: Diagnosis not present

## 2019-11-11 DIAGNOSIS — R079 Chest pain, unspecified: Secondary | ICD-10-CM

## 2019-11-11 DIAGNOSIS — R933 Abnormal findings on diagnostic imaging of other parts of digestive tract: Secondary | ICD-10-CM

## 2019-11-11 DIAGNOSIS — R0789 Other chest pain: Secondary | ICD-10-CM

## 2019-11-11 DIAGNOSIS — R110 Nausea: Secondary | ICD-10-CM

## 2019-11-11 DIAGNOSIS — K529 Noninfective gastroenteritis and colitis, unspecified: Secondary | ICD-10-CM | POA: Diagnosis not present

## 2019-11-11 NOTE — Patient Instructions (Addendum)
If you are age 64 or older, your body mass index should be between 23-30. Your Body mass index is 22.89 kg/m. If this is out of the aforementioned range listed, please consider follow up with your Primary Care Provider.  If you are age 25 or younger, your body mass index should be between 19-25. Your Body mass index is 22.89 kg/m. If this is out of the aformentioned range listed, please consider follow up with your Primary Care Provider.   Your provider has requested that you have lab work drawn next week at your pulmonary appointment.  Please take omeprazole before dinner meal for the next 2 weeks.  If you are still having issues, please start Dexilant 60mg  one capsule daily.  Please call our office after 2 weeks of taking Dexilant and we will send a prescription at that time.    You have been scheduled for an endoscopy. Please follow written instructions given to you at your visit today. If you use inhalers (even only as needed), please bring them with you on the day of your procedure.    Thank you for entrusting me with your care and choosing [facility].  Dr Rush Landmark
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
## 2019-11-14 ENCOUNTER — Other Ambulatory Visit: Payer: Self-pay

## 2019-11-14 ENCOUNTER — Encounter: Payer: Self-pay | Admitting: Pulmonary Disease

## 2019-11-14 ENCOUNTER — Other Ambulatory Visit: Payer: Medicaid Other

## 2019-11-14 ENCOUNTER — Ambulatory Visit (INDEPENDENT_AMBULATORY_CARE_PROVIDER_SITE_OTHER): Payer: Medicaid Other | Admitting: Pulmonary Disease

## 2019-11-14 ENCOUNTER — Other Ambulatory Visit (INDEPENDENT_AMBULATORY_CARE_PROVIDER_SITE_OTHER): Payer: Medicaid Other

## 2019-11-14 ENCOUNTER — Encounter: Payer: Self-pay | Admitting: Gastroenterology

## 2019-11-14 VITALS — BP 118/72 | HR 61 | Temp 97.2°F | Ht 69.0 in | Wt 152.8 lb

## 2019-11-14 DIAGNOSIS — R0789 Other chest pain: Secondary | ICD-10-CM

## 2019-11-14 DIAGNOSIS — G4700 Insomnia, unspecified: Secondary | ICD-10-CM

## 2019-11-14 DIAGNOSIS — R110 Nausea: Secondary | ICD-10-CM | POA: Insufficient documentation

## 2019-11-14 DIAGNOSIS — R933 Abnormal findings on diagnostic imaging of other parts of digestive tract: Secondary | ICD-10-CM | POA: Insufficient documentation

## 2019-11-14 DIAGNOSIS — K2100 Gastro-esophageal reflux disease with esophagitis, without bleeding: Secondary | ICD-10-CM

## 2019-11-14 DIAGNOSIS — K529 Noninfective gastroenteritis and colitis, unspecified: Secondary | ICD-10-CM | POA: Insufficient documentation

## 2019-11-14 LAB — TSH: TSH: 1.83 u[IU]/mL (ref 0.35–4.50)

## 2019-11-14 LAB — CORTISOL: Cortisol, Plasma: 10.4 ug/dL
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
## 2019-11-14 NOTE — Patient Instructions (Signed)
Will take Lunesta and Restoril off your list of medications  No need to resume any sleep aid if you feel you are functioning relatively well at present and sleeping better  I will get your message along to Wyn Quaker and Dr. Lamonte Sakai  I will see you as needed

## 2019-11-15 ENCOUNTER — Telehealth: Payer: Self-pay | Admitting: Pulmonary Disease

## 2019-11-15 DIAGNOSIS — J449 Chronic obstructive pulmonary disease, unspecified: Secondary | ICD-10-CM

## 2019-11-15 LAB — IGA: Immunoglobulin A: 192 mg/dL (ref 70–320)

## 2019-11-15 LAB — TISSUE TRANSGLUTAMINASE, IGA: (tTG) Ab, IgA: <1 U/mL

## 2019-11-15 MED ORDER — DOXYCYCLINE HYCLATE 100 MG PO TABS: 100.0000 mg | ORAL_TABLET | Freq: Two times a day (BID) | ORAL | 0 refills | Status: DC

## 2019-11-15 MED ORDER — PREDNISONE 10 MG PO TABS: ORAL_TABLET | ORAL | 0 refills | Status: DC

## 2019-11-15 NOTE — Telephone Encounter (Signed)
Called and spoke with patient, provided information per Aaron Edelman.  He verbalized understanding.  2-4 wk f/u scheduled.  Nothing further needed.

## 2019-11-15 NOTE — Telephone Encounter (Signed)
Tried calling the pt back and there was no answer- LMTCB x 1  Hold in triage

## 2019-11-15 NOTE — Telephone Encounter (Signed)
Pt returning a phone call. Pt can be reached at [phone].

## 2019-11-15 NOTE — Telephone Encounter (Signed)
11/15/2019  Patient was seen yesterday by Dr. Jenetta Downer.  He is a patient of Dr. Lamonte Sakai.  It was reported by the patient yesterday that he was coughing up discolored mucus.  Can we contact the patient and further investigate this.  Is he having any fevers, how long as this been happening?  Is a more mucus than usual?  What color is the mucus.  We may need to consider antibiotic therapy as well as closer follow-up with our office.  He is currently planned to have a follow-up in December/2021 with Dr. Lamonte Sakai.  Wyn Quaker, FNP

## 2019-11-15 NOTE — Telephone Encounter (Signed)
11/15/2019  Thank you for contacting the patient.  Would recommend that we treat patient has COPD exacerbation given length of symptoms as well as increased shortness of breath.  I will send in a course of doxycycline as well as a prednisone taper.  Doxycycline >>> 1 100 mg tablet every 12 hours for 7 days >>>take with food  >>>wear sunscreen   Prednisone 10mg  tablet  >>>4 tabs for 2 days, then 3 tabs for 2 days, 2 tabs for 2 days, then 1 tab for 2 days, then stop >>>take with food  >>>take in the morning    Please ensure the patient has follow-up with our office in 2 to 4 weeks with Dr. Lamonte Sakai or myself for further evaluation.  Wyn Quaker, FNP

## 2019-11-15 NOTE — Telephone Encounter (Signed)
Spoke with the pt  He states that his prod cough started approx 6 months ago  He has not noticed any more sputum than usual, but it has changed from clear to brown/black over the past 3 wks  He states also for the past 3 wks or so having some increased SOB and wheezing  He denies any f/c/s, body aches and states had both covid vaccines He is taking anoro every day and is using his albuterol inhaler once daily on average  Will forward back to [place] for [employer]

## 2019-11-17 ENCOUNTER — Telehealth: Payer: Self-pay

## 2019-11-17 NOTE — Addendum Note (Signed)
Addended byDebbe Mounts on: 11/17/2019 03:53 PM   Modules accepted: Orders

## 2019-11-17 NOTE — Telephone Encounter (Signed)
Pt has been scheduled for EGD at [facility] on 01/16/20. Please see letter for instructions and time. I have mailed instructions and also left detail message. If this appt will not work for pt, then I will call and get him r/s when I am back in the office next week.

## 2019-11-17 NOTE — Addendum Note (Signed)
Addended by: Debbe Mounts on: 11/17/2019 03:25 PM   Modules accepted: Orders, SmartSet

## 2019-12-13 ENCOUNTER — Ambulatory Visit (INDEPENDENT_AMBULATORY_CARE_PROVIDER_SITE_OTHER): Payer: Medicaid Other | Admitting: Pulmonary Disease

## 2019-12-13 ENCOUNTER — Other Ambulatory Visit: Payer: Self-pay

## 2019-12-13 ENCOUNTER — Encounter: Payer: Self-pay | Admitting: Pulmonary Disease

## 2019-12-13 VITALS — BP 120/70 | HR 74 | Temp 97.3°F | Ht 69.0 in | Wt 154.2 lb

## 2019-12-13 DIAGNOSIS — J449 Chronic obstructive pulmonary disease, unspecified: Secondary | ICD-10-CM

## 2019-12-13 DIAGNOSIS — K219 Gastro-esophageal reflux disease without esophagitis: Secondary | ICD-10-CM | POA: Diagnosis not present

## 2019-12-13 DIAGNOSIS — C3490 Malignant neoplasm of unspecified part of unspecified bronchus or lung: Secondary | ICD-10-CM

## 2019-12-13 DIAGNOSIS — G894 Chronic pain syndrome: Secondary | ICD-10-CM | POA: Insufficient documentation
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
## 2019-12-13 NOTE — Assessment & Plan Note (Signed)
Plan: Continue Anoro Ellipta Continue work-up with gastroenterology Follow-up with our office in December/2021

## 2019-12-13 NOTE — Assessment & Plan Note (Addendum)
>>  ASSESSMENT AND PLAN FOR GERD (GASTROESOPHAGEAL REFLUX DISEASE) WRITTEN ON 12/13/2019  4:37 PM BY Katey Barrie P, NP  Continue work-up with gastroenterology Continue omeprazole  Contact gastroenterology notify them that you are planning on switching to Dexilant with the samples that they provided GERD lifestyle recommendations provided today GERD diet recommended    >>ASSESSMENT AND PLAN FOR LARYNGOPHARYNGEAL REFLUX (LPR) WRITTEN ON 12/13/2019  4:37 PM BY Arhan Mcmanamon P, NP  Plan: Continue work-up with gastroenterology Continue omeprazole  Contact gastroenterology notify them that you are planning on switching to Dexilant with the samples that they provided GERD lifestyle recommendations provided today GERD diet recommended

## 2019-12-13 NOTE — Assessment & Plan Note (Signed)
Plan: Continue work-up with gastroenterology Continue omeprazole Contact gastroenterology notify them that you are planning on switching to [facility] with the samples that they provided GERD lifestyle recommendations provided today GERD diet recommended

## 2019-12-13 NOTE — Assessment & Plan Note (Signed)
Plan: Keep follow-up with [facility] Keep follow-up CT imaging as ordered by [facility]

## 2019-12-13 NOTE — Patient Instructions (Addendum)
You were seen today by Lauraine Rinne, NP  for:   1. Chronic obstructive pulmonary disease, unspecified COPD type (HCC)  Anoro Ellipta  >>> Take 1 puff daily in the morning right when you wake up >>>Rinse your mouth out after use >>>This is a daily maintenance inhaler, NOT a rescue inhaler >>>Contact our office if you are having difficulties affording or obtaining this medication >>>It is important for you to be able to take this daily and not miss any doses   Only use your albuterol as a rescue medication to be used if you can't catch your breath by resting or doing a relaxed purse lip breathing pattern.  - The less you use it, the better it will work when you need it. - Ok to use up to 2 puffs  every 4 hours if you must but call for immediate appointment if use goes up over your usual need - Don't leave home without it !!  (think of it like the spare tire for your car)   Note your daily symptoms > remember "red flags" for COPD:   >>>Increase in cough >>>increase in sputum production >>>increase in shortness of breath or activity  intolerance.   If you notice these symptoms, please call the office to be seen.    2. Laryngopharyngeal reflux (LPR) 3. Gastroesophageal reflux disease, unspecified whether esophagitis present  Continue omeprazole  Continue follow-up with gastroenterology  Notify gastroenterology that you are planning on starting to take the [place] samples that they provided for you  GERD management: >>>Avoid laying flat until 2 hours after meals >>>Elevate head of the bed including entire chest >>>Reduce size of meals and amount of fat, acid, spices, caffeine and sweets >>>If you are smoking, Please stop! >>>Decrease alcohol consumption >>>Work on maintaining a healthy weight with normal BMI    4. Chronic pain syndrome  Proceed forward with upcoming appointments with [facility] pain management  Continue forward with follow-ups with [facility] pain  management until fully established with [facility]   Follow Up:    Return in about 6 weeks (around 01/24/2020), or if symptoms worsen or fail to improve, for Follow up with Dr. Lamonte Sakai.   Notification of test results are managed in the following manner: If there are  any recommendations or changes to the  plan of care discussed in office today,  we will contact you and let you know what they are. If you do not hear from [place], then your results are normal and you can view them through your  MyChart account , or a letter will be sent to you. Thank you again for trusting [place] with your care  - Thank you, [facility]    It is flu season:   >>> Best ways to protect herself from the flu: Receive the yearly flu vaccine, practice good hand hygiene washing with soap and also using hand sanitizer when available, eat a nutritious meals, get adequate rest, hydrate appropriately       Please contact the office if your symptoms worsen or you have concerns that you are not improving.   Thank you for choosing [facility] for your healthcare, and for allowing [place] to partner with you on your healthcare journey. I am thankful to be able to provide care to you today.   Wyn Quaker FNP-C    Gastroesophageal Reflux Disease, Adult Gastroesophageal reflux (GER) happens when acid from the stomach flows up into the tube that connects the mouth and the  stomach (esophagus). Normally, food travels down the esophagus and stays in the stomach to be digested. With GER, food and stomach acid sometimes move back up into the esophagus. You may have a disease called gastroesophageal reflux disease (GERD) if the reflux:  Happens often.  Causes frequent or very bad symptoms.  Causes problems such as damage to the esophagus. When this happens, the esophagus becomes sore and swollen (inflamed). Over time, GERD can make small holes (ulcers) in the lining of the esophagus. What are the causes? This condition  is caused by a problem with the muscle between the esophagus and the stomach. When this muscle is weak or not normal, it does not close properly to keep food and acid from coming back up from the stomach. The muscle can be weak because of:  Tobacco use.  Pregnancy.  Having a certain type of hernia (hiatal hernia).  Alcohol use.  Certain foods and drinks, such as coffee, chocolate, onions, and peppermint. What increases the risk? You are more likely to develop this condition if you:  Are overweight.  Have a disease that affects your connective tissue.  Use NSAID medicines. What are the signs or symptoms? Symptoms of this condition include:  Heartburn.  Difficult or painful swallowing.  The feeling of having a lump in the throat.  A bitter taste in the mouth.  Bad breath.  Having a lot of saliva.  Having an upset or bloated stomach.  Belching.  Chest pain. Different conditions can cause chest pain. Make sure you see your doctor if you have chest pain.  Shortness of breath or noisy breathing (wheezing).  Ongoing (chronic) cough or a cough at night.  Wearing away of the surface of teeth (tooth enamel).  Weight loss. How is this treated? Treatment will depend on how bad your symptoms are. Your doctor may suggest:  Changes to your diet.  Medicine.  Surgery. Follow these instructions at home: Eating and drinking   Follow a diet as told by your doctor. You may need to avoid foods and drinks such as: ? Coffee and tea (with or without caffeine). ? Drinks that contain alcohol. ? Energy drinks and sports drinks. ? Bubbly (carbonated) drinks or sodas. ? Chocolate and cocoa. ? Peppermint and mint flavorings. ? Garlic and onions. ? Horseradish. ? Spicy and acidic foods. These include peppers, chili powder, curry powder, vinegar, hot sauces, and BBQ sauce. ? Citrus fruit juices and citrus fruits, such as oranges, lemons, and limes. ? Tomato-based foods. These  include red sauce, chili, salsa, and pizza with red sauce. ? Fried and fatty foods. These include donuts, french fries, potato chips, and high-fat dressings. ? High-fat meats. These include hot dogs, rib eye steak, sausage, ham, and bacon. ? High-fat dairy items, such as whole milk, butter, and cream cheese.  Eat small meals often. Avoid eating large meals.  Avoid drinking large amounts of liquid with your meals.  Avoid eating meals during the 2-3 hours before bedtime.  Avoid lying down right after you eat.  Do not exercise right after you eat. Lifestyle   Do not use any products that contain nicotine or tobacco. These include cigarettes, e-cigarettes, and chewing tobacco. If you need help quitting, ask your doctor.  Try to lower your stress. If you need help doing this, ask your doctor.  If you are overweight, lose an amount of weight that is healthy for you. Ask your doctor about a safe weight loss goal. General instructions  Pay attention to any  changes in your symptoms.  Take over-the-counter and prescription medicines only as told by your doctor. Do not take aspirin, ibuprofen, or other NSAIDs unless your doctor says it is okay.  Wear loose clothes. Do not wear anything tight around your waist.  Raise (elevate) the head of your bed about 6 inches (15 cm).  Avoid bending over if this makes your symptoms worse.  Keep all follow-up visits as told by your doctor. This is important. Contact a doctor if:  You have new symptoms.  You lose weight and you do not know why.  You have trouble swallowing or it hurts to swallow.  You have wheezing or a cough that keeps happening.  Your symptoms do not get better with treatment.  You have a hoarse voice. Get help right away if:  You have pain in your arms, neck, jaw, teeth, or back.  You feel sweaty, dizzy, or light-headed.  You have chest pain or shortness of breath.  You throw up (vomit) and your throw-up looks like  blood or coffee grounds.  You pass out (faint).  Your poop (stool) is bloody or black.  You cannot swallow, drink, or eat. Summary  If a person has gastroesophageal reflux disease (GERD), food and stomach acid move back up into the esophagus and cause symptoms or problems such as damage to the esophagus.  Treatment will depend on how bad your symptoms are.  Follow a diet as told by your doctor.  Take all medicines only as told by your doctor. This information is not intended to replace advice given to you by your health care provider. Make sure you discuss any questions you have with your health care provider. Document Revised: 07/29/2017 Document Reviewed: 07/29/2017 Elsevier Patient Education  2020 [place] for Gastroesophageal Reflux Disease, Adult When you have gastroesophageal reflux disease (GERD), the foods you eat and your eating habits are very important. Choosing the right foods can help ease your discomfort. Think about working with a nutrition specialist (dietitian) to help you make good choices. What are tips for following this plan?  Meals  Choose healthy foods that are low in fat, such as fruits, vegetables, whole grains, low-fat dairy products, and lean meat, fish, and poultry.  Eat small meals often instead of 3 large meals a day. Eat your meals slowly, and in a place where you are relaxed. Avoid bending over or lying down until 2-3 hours after eating.  Avoid eating meals 2-3 hours before bed.  Avoid drinking a lot of liquid with meals.  Cook foods using methods other than frying. Bake, grill, or broil food instead.  Avoid or limit: ? Chocolate. ? Peppermint or spearmint. ? Alcohol. ? Pepper. ? Black and decaffeinated coffee. ? Black and decaffeinated tea. ? Bubbly (carbonated) soft drinks. ? Caffeinated energy drinks and soft drinks.  Limit high-fat foods such as: ? Fatty meat or fried foods. ? Whole milk, cream, butter, or ice  cream. ? Nuts and nut butters. ? Pastries, donuts, and sweets made with butter or shortening.  Avoid foods that cause symptoms. These foods may be different for everyone. Common foods that cause symptoms include: ? Tomatoes. ? Oranges, lemons, and limes. ? Peppers. ? Spicy food. ? Onions and garlic. ? Vinegar. Lifestyle  Maintain a healthy weight. Ask your doctor what weight is healthy for you. If you need to lose weight, work with your doctor to do so safely.  Exercise for at least 30 minutes for 5  or more days each week, or as told by your doctor.  Wear loose-fitting clothes.  Do not smoke. If you need help quitting, ask your doctor.  Sleep with the head of your bed higher than your feet. Use a wedge under the mattress or blocks under the bed frame to raise the head of the bed. Summary  When you have gastroesophageal reflux disease (GERD), food and lifestyle choices are very important in easing your symptoms.  Eat small meals often instead of 3 large meals a day. Eat your meals slowly, and in a place where you are relaxed.  Limit high-fat foods such as fatty meat or fried foods.  Avoid bending over or lying down until 2-3 hours after eating.  Avoid peppermint and spearmint, caffeine, alcohol, and chocolate. This information is not intended to replace advice given to you by your health care provider. Make sure you discuss any questions you have with your health care provider. Document Revised: 05/13/2018 Document Reviewed: 02/26/2016 Elsevier Patient Education  [place].

## 2019-12-15 ENCOUNTER — Telehealth (HOSPITAL_COMMUNITY): Payer: Self-pay | Admitting: *Deleted

## 2019-12-19 ENCOUNTER — Other Ambulatory Visit: Payer: Self-pay

## 2019-12-19 ENCOUNTER — Encounter (HOSPITAL_COMMUNITY)
Admission: RE | Admit: 2019-12-19 | Discharge: 2019-12-19 | Disposition: A | Discharge status: Home / Self Care | Payer: Medicaid Other | Source: Ambulatory Visit | Attending: Emergency Medicine | Admitting: Emergency Medicine

## 2019-12-19 VITALS — BP 92/50 | HR 50 | Ht 69.25 in | Wt 153.0 lb

## 2019-12-19 DIAGNOSIS — J449 Chronic obstructive pulmonary disease, unspecified: Secondary | ICD-10-CM

## 2019-12-19 NOTE — Progress Notes (Signed)
[name] 64 y.o. male Pulmonary Rehab Orientation Note This patient who was referred to Pulmonary rehab by Dr. West Carbo* with the diagnosis of COPD, unspecified, he is stage 2,  arrived today in Cardiac and Pulmonary Rehab. He arrived ambulatory  with normal gait. He does carry portable oxygen, but did not bring it with him today.  Per pt, he uses oxygen intermittently. Color good, skin warm and dry. Patient is oriented to time and place. Patient's medical history, psychosocial health, and medications reviewed. Psychosocial assessment reveals pt lives alone. Pt is currently unemployed.  Pt reports his stress level is high. Areas of stress/anxiety include Health.  Pt does exhibit signs of depression. Signs of depression include anxiety and sadness and difficulty maintaining sleep. PHQ2/9 score 1/3. Pt shows fair  coping skills with negative outlook .  Offered emotional support and reassurance. Will continue to monitor and evaluate progress toward psychosocial goal(s) of improved quality of life with ability to handle stress and anxiety in healthy ways. Physical assessment reveals heart rate is bradycardic, breath sounds clear to auscultation, no wheezes, rales, or rhonchi, diminished. Grip strength equal, strong. Patient reports he does take medications as prescribed. Patient states he follows a Regular diet. [name] reports having a poor appetite and eats very little, his main meal is around 9 pm at night.Marland KitchenMarland KitchenWe discussed that he must eat before exercising in pulmonary rehab and gave suggestions of healthy choices. Patient's weight will be monitored closely. Demonstration and practice of PLB using pulse oximeter. Patient able to return demonstration satisfactorily. Safety and hand hygiene in the exercise area reviewed with patient. Patient voices understanding of the information reviewed. Department expectations discussed with patient and achievable goals were set. The patient shows enthusiasm about attending  the program and we look forward to working with this nice gentleman. The patient completed a 6 min walk test today, 12/19/2019 and to begin exercise on Tuesday, 12/27/2019.  [id number]

## 2019-12-19 NOTE — Progress Notes (Signed)
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 Components/Risk Factors/Patient Goals Review:   Goals and Risk Factor Review    Row Name 12/19/19 1302             Core Components/Risk Factors/Patient Goals Review   Personal Goals Review Increase knowledge of respiratory medications and ability to use respiratory devices properly.;Improve shortness of breath with ADL's;Develop more efficient breathing techniques such as purse lipped breathing and diaphragmatic breathing and practicing self-pacing with activity.              Core Components/Risk Factors/Patient Goals at Discharge (Final Review):   Goals and Risk Factor Review - 12/19/19 1302      Core Components/Risk Factors/Patient Goals Review   Personal Goals Review Increase knowledge of respiratory medications and ability to  use respiratory devices properly.;Improve shortness of breath with ADL's;Develop more efficient breathing techniques such as purse lipped breathing and diaphragmatic breathing and practicing self-pacing with activity.           ITP Comments:   Comments:

## 2019-12-26 NOTE — Progress Notes (Signed)
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 benefits of exercise, core components of exercise, frequency, duration, and intensity of an exercise routine, importance of utilizing pulse oximetry during exercise, safety while exercising, and options of places to exercise outside of rehab.     Pulmonary Medications:  -Verbally interactive group education provided by instructor with focus on inhaled medications and proper administration.   Anatomy and Physiology of the Respiratory System and Intimacy:  -Group instruction provided by PowerPoint, verbal discussion, and written material to support subject matter. Instructor reviews respiratory cycle and anatomical components of the respiratory system and their functions. Instructor also reviews differences in obstructive and restrictive  respiratory diseases with examples of each. Intimacy, Sex, and Sexuality differences are reviewed with a discussion on how relationships can change when diagnosed with pulmonary disease. Common sexual concerns are reviewed.   MD DAY -A group question and answer session with a medical doctor that allows participants to ask questions that relate to their pulmonary disease state.   OTHER EDUCATION -Group or individual verbal, written, or video instructions that support the educational goals of the pulmonary rehab program.   Holiday Eating Survival Tips:  -Group instruction provided by PowerPoint slides, verbal discussion, and written materials to support subject matter. The instructor gives patients tips, tricks, and techniques to help them not only survive but enjoy the holidays despite the onslaught of food that accompanies the holidays.   Knowledge Questionnaire Score:  Knowledge Questionnaire Score - 12/19/19 1239      Knowledge Questionnaire Score   Pre Score 14/18           Core Components/Risk Factors/Patient Goals at Admission:  Personal Goals and Risk Factors at Admission - 12/19/19 1302      Core Components/Risk Factors/Patient Goals on Admission   Improve shortness of breath with ADL's Yes    Intervention Provide education, individualized exercise plan and daily activity instruction to help decrease symptoms of SOB with activities of daily living.    Expected Outcomes Short Term: Improve cardiorespiratory fitness to achieve a reduction of symptoms when performing ADLs;Long Term: Be able to perform more ADLs without symptoms or delay the onset of symptoms           Core Components/Risk Factors/Patient Goals Review:   Goals and Risk Factor Review    Row Name 12/19/19 1302 12/26/19 1338           Core Components/Risk Factors/Patient Goals Review   Personal Goals Review Increase knowledge of respiratory medications and ability to use respiratory devices  properly.;Improve shortness of breath with ADL's;Develop more efficient breathing techniques such as purse lipped breathing and diaphragmatic breathing and practicing self-pacing with activity. Improve shortness of breath with ADL's;Develop more efficient breathing techniques such as purse lipped breathing and diaphragmatic breathing and practicing self-pacing with activity.;Increase knowledge of respiratory medications and ability to use respiratory devices properly.      Review -- Majesty will start exercising in pulmonary rehab tomorrow, too early to see progression toward program goals.      Expected Outcomes -- That Jahaziel will have move forward to begin to meet program goals in the next full 30 days.             Core Components/Risk Factors/Patient Goals at Discharge (Final Review):   Goals and Risk Factor Review - 12/26/19 1338      Core Components/Risk Factors/Patient Goals Review   Personal Goals Review Improve shortness of breath with ADL's;Develop more efficient breathing techniques such as purse lipped breathing and diaphragmatic breathing and practicing self-pacing with activity.;Increase knowledge of respiratory medications and ability to use respiratory devices properly.    Review Westyn will start exercising in pulmonary rehab tomorrow, too early to see progression toward program goals.    Expected Outcomes That Hyland will have move forward to begin to meet program goals in the next full 30 days.           ITP Comments:   Comments: ITP REVIEW Pt is making expected progress toward pulmonary rehab goals after completing 0 sessions. Recommend continued exercise, life style modification, education, and utilization of breathing techniques to increase stamina and strength and decrease shortness of breath with exertion.

## 2019-12-27 ENCOUNTER — Encounter (HOSPITAL_COMMUNITY)
Admission: RE | Admit: 2019-12-27 | Discharge: 2019-12-27 | Disposition: A | Discharge status: Home / Self Care | Payer: Medicaid Other | Source: Ambulatory Visit | Attending: Emergency Medicine | Admitting: Emergency Medicine

## 2019-12-27 ENCOUNTER — Other Ambulatory Visit: Payer: Self-pay

## 2019-12-27 DIAGNOSIS — J449 Chronic obstructive pulmonary disease, unspecified: Secondary | ICD-10-CM

## 2019-12-27 NOTE — Progress Notes (Signed)
Daily Session Note  Patient Details  Name: [name] MRN: [id number] Date of Birth: 1955-09-15 Referring Provider:     Pulmonary Rehab Walk Test from 12/19/2019 in [place]  Referring Provider Dr. Lamonte Sakai      Encounter Date: 12/27/2019  Check In:  Session Check In - 12/27/19 1349      Check-In   Supervising physician immediately available to respond to emergencies [facility] immediately available    Physician(s) Dr. Wynelle Cleveland    Location [facility]    Staff Present Rosebud Poles, RN, BSN;Lisa Ysidro Evert, RN;Zyann Mabry Hassell Done, MS, ACSM-CEP, Exercise Physiologist    Virtual Visit No    Medication changes reported     No    Fall or balance concerns reported    No    Tobacco Cessation No Change    Warm-up and Cool-down Performed as group-led instruction    Resistance Training Performed Yes    VAD Patient? No    PAD/SET Patient? No      Pain Assessment   Currently in Pain? No/denies           Capillary Blood Glucose: No results found for this or any previous visit (from the past 24 hour(s)).    Social History   Tobacco Use  Smoking Status Former Smoker  . Packs/day: 1.00  . Years: 0.50  . Pack years: 0.50  . Types: Cigarettes  . Start date: 1974  Smokeless Tobacco Never Used  Tobacco Comment   05/31/20 quit smoking     Goals Met:  Proper associated with RPD/PD & O2 Sat Exercise tolerated well No report of cardiac concerns or symptoms Strength training completed today  Goals Unmet:  Not Applicable  Comments: Service time is from 1325 to 1445 Patient completed first day of exercise and tolerated well with no complaints or concerns.    Dr. Fransico Him is Medical Director for Cardiac Rehab at [facility].

## 2020-01-03 ENCOUNTER — Other Ambulatory Visit: Payer: Self-pay | Admitting: Pulmonary Disease

## 2020-01-03 ENCOUNTER — Encounter (HOSPITAL_COMMUNITY): Payer: Medicaid Other

## 2020-01-03 ENCOUNTER — Telehealth (HOSPITAL_COMMUNITY): Payer: Self-pay | Admitting: Family Medicine

## 2020-01-03 DIAGNOSIS — K219 Gastro-esophageal reflux disease without esophagitis: Secondary | ICD-10-CM

## 2020-01-05 ENCOUNTER — Encounter (HOSPITAL_COMMUNITY)
Admission: RE | Admit: 2020-01-05 | Discharge: 2020-01-05 | Disposition: A | Discharge status: Home / Self Care | Payer: Medicaid Other | Source: Ambulatory Visit | Attending: Emergency Medicine | Admitting: Emergency Medicine

## 2020-01-05 ENCOUNTER — Other Ambulatory Visit: Payer: Self-pay

## 2020-01-05 VITALS — Wt 157.4 lb

## 2020-01-05 DIAGNOSIS — J449 Chronic obstructive pulmonary disease, unspecified: Secondary | ICD-10-CM | POA: Diagnosis present

## 2020-01-05 NOTE — Progress Notes (Signed)
Daily Session Note  Patient Details  Name: ARLESTER KEEHAN MRN: [id number] Date of Birth: [birth year] Referring Provider:     Pulmonary Rehab Walk Test from 12/19/2019 in [place]  Referring Provider Dr. Lamonte Sakai      Encounter Date: 01/05/2020  Check In:  Session Check In - 01/05/20 1524      Check-In   Supervising physician immediately available to respond to emergencies [facility] immediately available    Physician(s) Dr. Roderic Palau    [facility]    Staff Present Rosebud Poles, RN, BSN;Lisa Ysidro Evert, RN;Nessa Ramaker Hassell Done, MS, ACSM-CEP, Exercise Physiologist    Virtual Visit No    Medication changes reported     No    Fall or balance concerns reported    No    Tobacco Cessation No Change    Warm-up and Cool-down Performed on first and last piece of equipment    Resistance Training Performed Yes    VAD Patient? No    PAD/SET Patient? No      Pain Assessment   Currently in Pain? No/denies    Multiple Pain Sites No           Capillary Blood Glucose: No results found for this or any previous visit (from the past 24 hour(s)).    Social History   Tobacco Use  Smoking Status Former Smoker  . Packs/day: 1.00  . Years: 0.50  . Pack years: 0.50  . Types: Cigarettes  . Start date: 1974  Smokeless Tobacco Never Used  Tobacco Comment   05/31/20 quit smoking     Goals Met:  Proper associated with RPD/PD & O2 Sat Exercise tolerated well No report of cardiac concerns or symptoms Strength training completed today  Goals Unmet:  Not Applicable  Comments: Service time is from 1350 to 1510    Dr. Fransico Him is Medical Director for Cardiac Rehab at [facility].

## 2020-01-10 ENCOUNTER — Other Ambulatory Visit: Payer: Self-pay

## 2020-01-10 ENCOUNTER — Encounter (HOSPITAL_COMMUNITY)
Admission: RE | Admit: 2020-01-10 | Discharge: 2020-01-10 | Disposition: A | Discharge status: Home / Self Care | Payer: Medicaid Other | Source: Ambulatory Visit | Attending: Emergency Medicine | Admitting: Emergency Medicine

## 2020-01-10 DIAGNOSIS — J449 Chronic obstructive pulmonary disease, unspecified: Secondary | ICD-10-CM | POA: Diagnosis not present

## 2020-01-10 NOTE — Progress Notes (Signed)
Daily Session Note  Patient Details  Name: [name] MRN: [id number] Date of Birth: 07/14/1955 Referring Provider:     Pulmonary Rehab Walk Test from 12/19/2019 in [place]  Referring Provider Dr. Lamonte Sakai      Encounter Date: 01/10/2020  Check In:  Session Check In - 01/10/20 [name]      Check-In   Supervising physician immediately available to respond to emergencies [facility] immediately available    Physician(s) Dr. Cyndia Skeeters    [facility]    Staff Present Rosebud Poles, RN, Roque Cash, RN    Virtual Visit No    Medication changes reported     No    Fall or balance concerns reported    No    Tobacco Cessation No Change    Warm-up and Cool-down Performed on first and last piece of equipment    Resistance Training Performed Yes    VAD Patient? No    PAD/SET Patient? No      Pain Assessment   Currently in Pain? No/denies    Multiple Pain Sites No           Capillary Blood Glucose: No results found for this or any previous visit (from the past 24 hour(s)).    Social History   Tobacco Use  Smoking Status Former Smoker  . Packs/day: 1.00  . Years: 0.50  . Pack years: 0.50  . Types: Cigarettes  . Start date: 1974  Smokeless Tobacco Never Used  Tobacco Comment   05/31/20 quit smoking     Goals Met:  Proper associated with RPD/PD & O2 Sat Exercise tolerated well Strength training completed today  Goals Unmet:  Not Applicable  Comments: Service time is from 1315 to [place]    Dr. Fransico Him is Medical Director for Cardiac Rehab at [facility].
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
## 2020-01-12 ENCOUNTER — Encounter (HOSPITAL_COMMUNITY)
Admission: RE | Admit: 2020-01-12 | Discharge: 2020-01-12 | Disposition: A | Discharge status: Home / Self Care | Payer: Medicaid Other | Source: Ambulatory Visit | Attending: Emergency Medicine | Admitting: Emergency Medicine

## 2020-01-12 ENCOUNTER — Other Ambulatory Visit (HOSPITAL_COMMUNITY)
Admission: RE | Admit: 2020-01-12 | Discharge: 2020-01-12 | Disposition: A | Discharge status: Home / Self Care | Payer: Medicaid Other | Source: Ambulatory Visit | Attending: Gastroenterology | Admitting: Gastroenterology

## 2020-01-12 DIAGNOSIS — Z20822 Contact with and (suspected) exposure to covid-19: Secondary | ICD-10-CM | POA: Diagnosis not present

## 2020-01-12 DIAGNOSIS — Z01812 Encounter for preprocedural laboratory examination: Secondary | ICD-10-CM | POA: Diagnosis not present

## 2020-01-12 LAB — SARS CORONAVIRUS 2 (TAT 6-24 HRS): SARS Coronavirus 2: NEGATIVE

## 2020-01-13 ENCOUNTER — Encounter (HOSPITAL_COMMUNITY): Payer: Self-pay | Admitting: Gastroenterology

## 2020-01-13 ENCOUNTER — Other Ambulatory Visit: Payer: Self-pay

## 2020-01-13 NOTE — Progress Notes (Signed)
Pre call done for endo procedure Monday 01/15/20. Patient states he has been quarantined since covid test, will be NPO past midnight before procedure, and confirmed he has a driver taking him home post procedure. All questions addressed.

## 2020-01-13 NOTE — Progress Notes (Signed)
[name] denies chest pain or shortness of breath. Patient was tested for Covid 12/9 , he states he has been in quarantine since that time.

## 2020-01-16 ENCOUNTER — Ambulatory Visit (HOSPITAL_COMMUNITY)
Admission: RE | Admit: 2020-01-16 | Discharge: 2020-01-16 | Disposition: A | Discharge status: Home / Self Care | Payer: Medicaid Other | Attending: Gastroenterology | Admitting: Gastroenterology

## 2020-01-16 ENCOUNTER — Ambulatory Visit (HOSPITAL_COMMUNITY): Payer: Medicaid Other | Admitting: Anesthesiology

## 2020-01-16 ENCOUNTER — Encounter (HOSPITAL_COMMUNITY): Payer: Self-pay | Admitting: Gastroenterology

## 2020-01-16 ENCOUNTER — Encounter (HOSPITAL_COMMUNITY)
Admission: RE | Disposition: A | Discharge status: Home / Self Care | Payer: Self-pay | Source: Home / Self Care | Attending: Gastroenterology

## 2020-01-16 ENCOUNTER — Other Ambulatory Visit: Payer: Self-pay

## 2020-01-16 DIAGNOSIS — R1310 Dysphagia, unspecified: Secondary | ICD-10-CM | POA: Diagnosis not present

## 2020-01-16 DIAGNOSIS — K449 Diaphragmatic hernia without obstruction or gangrene: Secondary | ICD-10-CM | POA: Insufficient documentation

## 2020-01-16 DIAGNOSIS — Z905 Acquired absence of kidney: Secondary | ICD-10-CM | POA: Insufficient documentation

## 2020-01-16 DIAGNOSIS — R1084 Generalized abdominal pain: Secondary | ICD-10-CM | POA: Insufficient documentation

## 2020-01-16 DIAGNOSIS — Z87891 Personal history of nicotine dependence: Secondary | ICD-10-CM | POA: Insufficient documentation

## 2020-01-16 DIAGNOSIS — K209 Esophagitis, unspecified without bleeding: Secondary | ICD-10-CM

## 2020-01-16 DIAGNOSIS — R933 Abnormal findings on diagnostic imaging of other parts of digestive tract: Secondary | ICD-10-CM

## 2020-01-16 DIAGNOSIS — K219 Gastro-esophageal reflux disease without esophagitis: Secondary | ICD-10-CM

## 2020-01-16 DIAGNOSIS — K2100 Gastro-esophageal reflux disease with esophagitis, without bleeding: Secondary | ICD-10-CM

## 2020-01-16 DIAGNOSIS — R0789 Other chest pain: Secondary | ICD-10-CM

## 2020-01-16 DIAGNOSIS — K3189 Other diseases of stomach and duodenum: Secondary | ICD-10-CM | POA: Diagnosis not present

## 2020-01-16 DIAGNOSIS — K529 Noninfective gastroenteritis and colitis, unspecified: Secondary | ICD-10-CM

## 2020-01-16 DIAGNOSIS — R110 Nausea: Secondary | ICD-10-CM

## 2020-01-16 HISTORY — PX: ESOPHAGOGASTRODUODENOSCOPY (EGD) WITH PROPOFOL: SHX5813

## 2020-01-16 HISTORY — PX: SAVORY DILATION: SHX5439

## 2020-01-16 HISTORY — PX: BIOPSY: SHX5522

## 2020-01-16 SURGERY — ESOPHAGOGASTRODUODENOSCOPY (EGD) WITH PROPOFOL
Anesthesia: Monitor Anesthesia Care

## 2020-01-16 MED ORDER — LACTATED RINGERS IV SOLN: INTRAVENOUS | Status: DC

## 2020-01-16 MED ORDER — OMEPRAZOLE 40 MG PO CPDR: 40.0000 mg | DELAYED_RELEASE_CAPSULE | Freq: Two times a day (BID) | ORAL | 3 refills | Status: DC

## 2020-01-16 MED ORDER — LIDOCAINE 2% (20 MG/ML) 5 ML SYRINGE
INTRAMUSCULAR | Status: DC | PRN
  Administered 2020-01-16: 100 mg via INTRAVENOUS

## 2020-01-16 MED ORDER — SODIUM CHLORIDE 0.9 % IV SOLN: INTRAVENOUS | Status: DC

## 2020-01-16 MED ORDER — PROPOFOL 500 MG/50ML IV EMUL
INTRAVENOUS | Status: DC | PRN
  Administered 2020-01-16: 100 ug/kg/min via INTRAVENOUS

## 2020-01-16 MED ORDER — PROPOFOL 10 MG/ML IV BOLUS
INTRAVENOUS | Status: DC | PRN
  Administered 2020-01-16: 40 mg via INTRAVENOUS
  Administered 2020-01-16: 20 mg via INTRAVENOUS

## 2020-01-16 SURGICAL SUPPLY — 14 items

## 2020-01-16 NOTE — Discharge Instructions (Signed)
YOU HAD AN ENDOSCOPIC PROCEDURE TODAY: Refer to the procedure report and other information in the discharge instructions given to you for any specific questions about what was found during the examination. If this information does not answer your questions, please call [place] office at [phone] to clarify.   YOU SHOULD EXPECT: Some feelings of bloating in the abdomen. Passage of more gas than usual. Walking can help get rid of the air that was put into your GI tract during the procedure and reduce the bloating. If you had a lower endoscopy (such as a colonoscopy or flexible sigmoidoscopy) you may notice spotting of blood in your stool or on the toilet paper. Some abdominal soreness may be present for a day or two, also.  DIET: Your first meal following the procedure should be a light meal and then it is ok to progress to your normal diet. A half-sandwich or bowl of soup is an example of a good first meal. Heavy or fried foods are harder to digest and may make you feel nauseous or bloated. Drink plenty of fluids but you should avoid alcoholic beverages for 24 hours. If you had a esophageal dilation, please see attached instructions for diet.    ACTIVITY: Your care partner should take you home directly after the procedure. You should plan to take it easy, moving slowly for the rest of the day. You can resume normal activity the day after the procedure however YOU SHOULD NOT DRIVE, use power tools, machinery or perform tasks that involve climbing or major physical exertion for 24 hours (because of the sedation medicines used during the test).   SYMPTOMS TO REPORT IMMEDIATELY: A gastroenterologist can be reached at any hour. Please call [phone]  for any of the following symptoms:   Following upper endoscopy (EGD, EUS, ERCP, esophageal dilation) Vomiting of blood or coffee ground material  New, significant abdominal pain  New, significant chest pain or pain under the shoulder blades  Painful or  persistently difficult swallowing  New shortness of breath  Black, tarry-looking or red, bloody stools  FOLLOW UP:  If any biopsies were taken you will be contacted by phone or by letter within the next 1-3 weeks. Call [phone]  if you have not heard about the biopsies in 3 weeks.  Please also call with any specific questions about appointments or follow up tests.  
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
## 2020-01-16 NOTE — Anesthesia Postprocedure Evaluation (Signed)
Anesthesia Post Note  Patient: [name]  Procedure(s) Performed: ESOPHAGOGASTRODUODENOSCOPY (EGD) WITH PROPOFOL (N/A ) BIOPSY SAVORY DILATION (N/A )     Patient location during evaluation: Endoscopy Anesthesia Type: MAC Level of consciousness: awake and alert Pain management: pain level controlled Vital Signs Assessment: post-procedure vital signs reviewed and stable Respiratory status: spontaneous breathing, nonlabored ventilation, respiratory function stable and patient connected to nasal cannula oxygen Cardiovascular status: stable and blood pressure returned to baseline Postop Assessment: no apparent nausea or vomiting Anesthetic complications: no   No complications documented.  Last Vitals:  Vitals:   01/16/20 1110 01/16/20 1120  BP: 129/81 (!) 158/82  Pulse: (!) 50 (!) 47  Resp: 18 14  Temp:    SpO2: 95% 97%    Last Pain:  Vitals:   01/16/20 1120  TempSrc:   PainSc: 5                  Alphonsa Brickle COKER

## 2020-01-16 NOTE — Transfer of Care (Signed)
Immediate Anesthesia Transfer of Care Note  Patient: [name]  Procedure(s) Performed: ESOPHAGOGASTRODUODENOSCOPY (EGD) WITH PROPOFOL (N/A ) BIOPSY SAVORY DILATION (N/A )  Patient Location: PACU  Anesthesia Type:MAC  Level of Consciousness: drowsy and patient cooperative  Airway & Oxygen Therapy: Patient Spontanous Breathing and Patient connected to nasal cannula oxygen  Post-op Assessment: Report given to RN and Post -op Vital signs reviewed and stable  Post vital signs: Reviewed and stable  Last Vitals:  Vitals Value Taken Time  BP 116/79 01/16/20 1100  Temp    Pulse 56 01/16/20 1101  Resp 16 01/16/20 1101  SpO2 99 % 01/16/20 1101  Vitals shown include unvalidated device data.  Last Pain:  Vitals:   01/16/20 0808  TempSrc: Oral  PainSc: 5          Complications: No complications documented.

## 2020-01-16 NOTE — Op Note (Signed)
[facility] Patient Name: [name] Procedure Date : 01/16/2020 MRN: [id number] Attending MD: Justice Britain , MD Date of Birth: [birth year] CSN: [id number] Age: 64 Admit Type: Outpatient Procedure:                Upper GI endoscopy Indications:              Generalized abdominal pain, , Dysphagia, Follow-up                            of esophagitis - most likely MSK (had improved                            previously but mildly increased again), previous MW                            tear Providers:                Justice Britain, MD, Josie Dixon, RN,                            Jeanella Cara, RN, Cletis Athens, Technician Referring MD:             Fredric Dine. Lott Medicines:                Monitored Anesthesia Care Complications:            No immediate complications. Estimated Blood Loss:     Estimated blood loss was minimal. Procedure:                Pre-Anesthesia Assessment:                           - Prior to the procedure, a History and Physical                            was performed, and patient medications and                            allergies were reviewed. The patient's tolerance of                            previous anesthesia was also reviewed. The risks                            and benefits of the procedure and the sedation                            options and risks were discussed with the patient.                            All questions were answered, and informed consent                            was obtained. Prior Anticoagulants: The patient has  taken no previous anticoagulant or antiplatelet                            agents. ASA Grade Assessment: III - A patient with                            severe systemic disease. After reviewing the risks                            and benefits, the patient was deemed in                            satisfactory condition to undergo the procedure.                            After obtaining informed consent, the endoscope was                            passed under direct vision. Throughout the                            procedure, the patient's blood pressure, pulse, and                            oxygen saturations were monitored continuously. The                            GIF-H190 ([id number]) Olympus gastroscope was                            introduced through the mouth, and advanced to the                            second part of duodenum. The upper GI endoscopy was                            accomplished without difficulty. The patient                            tolerated the procedure. Scope In: Scope Out: Findings:      No gross lesions were noted in the entire esophagus. After the rest of       the EGD was complete a guidewire was placed and the scope was withdrawn.       Dilation was performed with a Savary dilator with mild resistance at 16       mm and moderate resistance at 17 mm. The dilation site was examined       following endoscope reinsertion and showed moderate mucosal disruption,       moderate improvement in luminal narrowing just below the UES and no       perforation. I decided to      The Z-line was regular and was found 37 cm from the incisors.      A 3 cm hiatal hernia was present.      Striped mildly erythematous mucosa without  bleeding was found in the       gastric antrum.      No gross lesions were noted in the entire examined stomach. Biopsies       were taken with a cold forceps for histology and Helicobacter pylori       testing.      No gross lesions were noted in the duodenal bulb, in the first portion       of the duodenum and in the second portion of the duodenum. Impression:               - No gross lesions in esophagus. Dilated with                            mucosal wrent noted below the UES.                           - Z-line regular, 37 cm from the incisors.                           - 3 cm hiatal  hernia.                           - Erythematous mucosa in the antrum. No other gross                            lesions in the stomach. Biopsied.                           - No gross lesions in the duodenal bulb, in the                            first portion of the duodenum and in the second                            portion of the duodenum. Recommendation:           - The patient will be observed post-procedure,                            until all discharge criteria are met.                           - Discharge patient to home.                           - Patient has a contact number available for                            emergencies. The signs and symptoms of potential                            delayed complications were discussed with the                            patient. Return to normal activities tomorrow.  Written discharge instructions were provided to the                            patient.                           - Dilation diet - clear liquids for 2 hours then                            full liquids for 2 hours then soft diet thereafter.                           - Increase Omeprazole back to 40 mg twice daily for                            next 44-month.                           - If dysphagia symptoms persist post dilation then                            would consider Esophageal Manometry. If dysphagia                            symptoms are improved but return in time would                            consider a repeat dilation up to 18 mm at minimum.                           - Continue present medications.                           - Await pathology results.                           - The findings and recommendations were discussed                            with the patient. Procedure Code(s):        --- Professional ---                           [id number] Esophagogastroduodenoscopy, flexible,                            transoral;  with insertion of guide wire followed by                            passage of dilator(s) through esophagus over guide                            wire Diagnosis Code(s):        --- Professional ---  K44.9, Diaphragmatic hernia without obstruction or                            gangrene                           K31.89, Other diseases of stomach and duodenum                           R10.84, Generalized abdominal pain                           R13.10, Dysphagia, unspecified                           K20.90, Esophagitis, unspecified without bleeding CPT copyright 2019 American Medical Association. All rights reserved. The codes documented in this report are preliminary and upon coder review may  be revised to meet current compliance requirements. Justice Britain, MD 01/16/2020 11:11:19 AM Number of Addenda: 0

## 2020-01-16 NOTE — Anesthesia Preprocedure Evaluation (Signed)
Anesthesia Evaluation  Patient identified by MRN, date of birth, ID band Patient awake    Reviewed: Allergy & Precautions, NPO status , Patient's Chart, lab work & pertinent test results  Airway Mallampati: II  TM Distance: >3 FB     Dental  (+) Edentulous Upper, Edentulous Lower   Pulmonary former smoker,    breath sounds clear to auscultation       Cardiovascular  Rhythm:Regular Rate:Normal     Neuro/Psych    GI/Hepatic   Endo/Other    Renal/GU      Musculoskeletal   Abdominal   Peds  Hematology   Anesthesia Other Findings   Reproductive/Obstetrics                             Anesthesia Physical Anesthesia Plan  ASA: III  Anesthesia Plan: MAC   Post-op Pain Management:    Induction: Intravenous  PONV Risk Score and Plan: Ondansetron, Propofol infusion and Dexamethasone  Airway Management Planned: Natural Airway and Nasal Cannula  Additional Equipment:   Intra-op Plan:   Post-operative Plan:   Informed Consent: I have reviewed the patients History and Physical, chart, labs and discussed the procedure including the risks, benefits and alternatives for the proposed anesthesia with the patient or authorized representative who has indicated his/her understanding and acceptance.       Plan Discussed with: CRNA and Anesthesiologist  Anesthesia Plan Comments:         Anesthesia Quick Evaluation

## 2020-01-17 ENCOUNTER — Other Ambulatory Visit: Payer: Self-pay | Admitting: Physician Assistant

## 2020-01-17 ENCOUNTER — Encounter (HOSPITAL_COMMUNITY)
Admission: RE | Admit: 2020-01-17 | Discharge: 2020-01-17 | Disposition: A | Discharge status: Home / Self Care | Payer: Medicaid Other | Source: Ambulatory Visit | Attending: Emergency Medicine | Admitting: Emergency Medicine

## 2020-01-17 ENCOUNTER — Telehealth (HOSPITAL_COMMUNITY): Payer: Self-pay | Admitting: Family Medicine

## 2020-01-17 ENCOUNTER — Encounter (HOSPITAL_COMMUNITY): Payer: Self-pay | Admitting: Gastroenterology

## 2020-01-17 DIAGNOSIS — J449 Chronic obstructive pulmonary disease, unspecified: Secondary | ICD-10-CM

## 2020-01-17 LAB — SURGICAL PATHOLOGY

## 2020-01-17 NOTE — Progress Notes (Signed)
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 difference of  2-3 points is a clinically meaningful difference.  A difference of 2-3 points in the total score of the Quality of Life Index has been associated with significant improvement in overall quality of life, self-image, physical symptoms, and general health in studies assessing change in quality of life.  PHQ-9: Recent Review Flowsheet Data    Depression screen [facility] 2/9 12/19/2019   Decreased Interest 0   Down, Depressed, Hopeless 1   PHQ - 2 Score 1   Altered sleeping 1   Tired, decreased energy 1   Change in appetite 1   Feeling bad or failure about yourself  0   Trouble concentrating 0   Moving slowly or fidgety/restless 0    Suicidal thoughts 0   Difficult doing work/chores Somewhat difficult     Interpretation of Total Score  Total Score Depression Severity:  1-4 = Minimal depression, 5-9 = Mild depression, 10-14 = Moderate depression, 15-19 = Moderately severe depression, 20-27 = Severe depression   Psychosocial Evaluation and Intervention:  Psychosocial Evaluation - 12/19/19 1258      Psychosocial Evaluation & Interventions   Interventions Stress management education;Relaxation education;Encouraged to exercise with the program and follow exercise prescription    Comments For patient to manage stress and anxiety in healthy ways.    Expected Outcomes QOL will improve as patient is able to learn to relax and reduces his stress.    Continue Psychosocial Services  Follow up required by staff           Psychosocial Re-Evaluation:  Psychosocial Re-Evaluation    [place] Name 12/19/19 1301 12/26/19 1336 01/12/20 1115         Psychosocial Re-Evaluation   Current issues with History of Depression;Current Depression;Current Anxiety/Panic;Current Stress Concerns Current Depression;History of Depression;Current Stress Concerns;Current Anxiety/Panic History of Depression;Current Depression;Current Stress Concerns     Comments -- No change in psychosocial concerns which were identified 1 week ago at initial orientation/walk test. Is handling his chronic illness stress well, no concerns identified.     Expected Outcomes -- For Maynor to be free of psychosocial concerns while participating in pulmonary rehab. For Jule to continue to handle his stress and depression in positive ways     Interventions -- Encouraged to attend Pulmonary Rehabilitation for the exercise;Relaxation education;Stress management education --     Continue Psychosocial Services  -- Follow up required by staff Follow up required by staff  Initial Review   Source of Stress Concerns -- Chronic Illness;Unable to participate in former  interests or hobbies;Unable to perform yard/household activities Chronic Illness;Unable to perform yard/household activities;Unable to participate in former interests or hobbies            Psychosocial Discharge (Final Psychosocial Re-Evaluation):  Psychosocial Re-Evaluation - 01/12/20 1115      Psychosocial Re-Evaluation   Current issues with History of Depression;Current Depression;Current Stress Concerns    Comments Is handling his chronic illness stress well, no concerns identified.    Expected Outcomes For Trace to continue to handle his stress and depression in positive ways    Continue Psychosocial Services  Follow up required by staff      Initial Review   Source of Stress Concerns Chronic Illness;Unable to perform yard/household activities;Unable to participate in former interests or hobbies           Education: Education Goals: Education classes will be provided on a weekly basis, covering required topics. Participant will state understanding/return demonstration of topics presented.  Learning Barriers/Preferences:  Learning Barriers/Preferences - 12/19/19 1301      Learning Barriers/Preferences   Learning Barriers None    Learning Preferences None           Education Topics: Risk Factor Reduction:  -Group instruction that is supported by a PowerPoint presentation. Instructor discusses the definition of a risk factor, different risk factors for pulmonary disease, and how the heart and lungs work together.     Nutrition for Pulmonary Patient:  -Group instruction provided by PowerPoint slides, verbal discussion, and written materials to support subject matter. The instructor gives an explanation and review of healthy diet recommendations, which includes a discussion on weight management, recommendations for fruit and vegetable consumption, as well as protein, fluid, caffeine, fiber, sodium, sugar, and alcohol. Tips for eating when patients are short of breath are  discussed.   Pursed Lip Breathing:  -Group instruction that is supported by demonstration and informational handouts. Instructor discusses the benefits of pursed lip and diaphragmatic breathing and detailed demonstration on how to preform both.     Oxygen Safety:  -Group instruction provided by PowerPoint, verbal discussion, and written material to support subject matter. There is an overview of "What is Oxygen" and "Why do we need it".  Instructor also reviews how to create a safe environment for oxygen use, the importance of using oxygen as prescribed, and the risks of noncompliance. There is a brief discussion on traveling with oxygen and resources the patient may utilize.   Oxygen Equipment:  -Group instruction provided by [facility] Staff utilizing handouts, written materials, and equipment demonstrations.   Signs and Symptoms:  -Group instruction provided by written material and verbal discussion to support subject matter. Warning signs and symptoms of infection, stroke, and heart attack are reviewed and when to call the physician/911 reinforced. Tips for preventing the spread of infection discussed.   Advanced Directives:  -Group instruction provided by verbal instruction and written material to support subject matter. Instructor reviews Advanced Directive laws and proper instruction for filling out document.   Pulmonary Video:  -Group video education that reviews the importance of medication and oxygen compliance, exercise, good nutrition, pulmonary hygiene, and pursed lip and diaphragmatic breathing for the pulmonary patient.   Exercise for the Pulmonary Patient:  -Group instruction that is supported by a PowerPoint presentation. Instructor discusses benefits of exercise, core components of exercise, frequency, duration, and intensity of an exercise routine, importance of utilizing pulse oximetry during  exercise, safety while exercising, and options of places to exercise outside  of rehab.     Pulmonary Medications:  -Verbally interactive group education provided by instructor with focus on inhaled medications and proper administration.   Anatomy and Physiology of the Respiratory System and Intimacy:  -Group instruction provided by PowerPoint, verbal discussion, and written material to support subject matter. Instructor reviews respiratory cycle and anatomical components of the respiratory system and their functions. Instructor also reviews differences in obstructive and restrictive respiratory diseases with examples of each. Intimacy, Sex, and Sexuality differences are reviewed with a discussion on how relationships can change when diagnosed with pulmonary disease. Common sexual concerns are reviewed.   MD DAY -A group question and answer session with a medical doctor that allows participants to ask questions that relate to their pulmonary disease state.   OTHER EDUCATION -Group or individual verbal, written, or video instructions that support the educational goals of the pulmonary rehab program. [place] from 01/05/2020 in [place]  Date 01/05/20  Educator Handout  [[facility] [place]  Instruction Review Code 1- Verbalizes Understanding      Holiday Eating Survival Tips:  -Group instruction provided by PowerPoint slides, verbal discussion, and written materials to support subject matter. The instructor gives patients tips, tricks, and techniques to help them not only survive but enjoy the holidays despite the onslaught of food that accompanies the holidays.   Knowledge Questionnaire Score:  Knowledge Questionnaire Score - 12/19/19 1239      Knowledge Questionnaire Score   Pre Score 14/18           Core Components/Risk Factors/Patient Goals at Admission:  Personal Goals and Risk Factors at Admission - 12/19/19 1302      Core Components/Risk Factors/Patient Goals on  Admission   Improve shortness of breath with ADL's Yes    Intervention Provide education, individualized exercise plan and daily activity instruction to help decrease symptoms of SOB with activities of daily living.    Expected Outcomes Short Term: Improve cardiorespiratory fitness to achieve a reduction of symptoms when performing ADLs;Long Term: Be able to perform more ADLs without symptoms or delay the onset of symptoms           Core Components/Risk Factors/Patient Goals Review:   Goals and Risk Factor Review    Row Name 12/19/19 1302 12/26/19 1338 01/12/20 1118         Core Components/Risk Factors/Patient Goals Review   Personal Goals Review Increase knowledge of respiratory medications and ability to use respiratory devices properly.;Improve shortness of breath with ADL's;Develop more efficient breathing techniques such as purse lipped breathing and diaphragmatic breathing and practicing self-pacing with activity. Improve shortness of breath with ADL's;Develop more efficient breathing techniques such as purse lipped breathing and diaphragmatic breathing and practicing self-pacing with activity.;Increase knowledge of respiratory medications and ability to use respiratory devices properly. Improve shortness of breath with ADL's;Develop more efficient breathing techniques such as purse lipped breathing and diaphragmatic breathing and practicing self-pacing with activity.;Increase knowledge of respiratory medications and ability to use respiratory devices properly.     Review -- Gal will start exercising in pulmonary rehab tomorrow, too early to see progression toward program goals. Just started program, too early to make progress toward goals.     Expected Outcomes -- That Dang will have move forward to begin to meet program goals in the next full 30 days. For Glade to make progress in the next full 30  days.            Core Components/Risk Factors/Patient Goals at Discharge (Final  Review):   Goals and Risk Factor Review - 01/12/20 1118      Core Components/Risk Factors/Patient Goals Review   Personal Goals Review Improve shortness of breath with ADL's;Develop more efficient breathing techniques such as purse lipped breathing and diaphragmatic breathing and practicing self-pacing with activity.;Increase knowledge of respiratory medications and ability to use respiratory devices properly.    Review Just started program, too early to make progress toward goals.    Expected Outcomes For Becky to make progress in the next full 30 days.           ITP Comments:   Comments: ITP REVIEW Pt is making expected progress toward pulmonary rehab goals after completing 3 sessions. Recommend continued exercise, life style modification, education, and utilization of breathing techniques to increase stamina and strength and decrease shortness of breath with exertion.

## 2020-01-18 ENCOUNTER — Encounter: Payer: Self-pay | Admitting: Gastroenterology

## 2020-01-19 ENCOUNTER — Encounter (HOSPITAL_COMMUNITY): Payer: Medicaid Other

## 2020-01-19 ENCOUNTER — Telehealth (HOSPITAL_COMMUNITY): Payer: Self-pay | Admitting: Family Medicine

## 2020-01-24 ENCOUNTER — Telehealth (HOSPITAL_COMMUNITY): Payer: Self-pay | Admitting: *Deleted

## 2020-01-24 ENCOUNTER — Encounter (HOSPITAL_COMMUNITY): Payer: Medicaid Other

## 2020-01-25 ENCOUNTER — Encounter: Payer: Self-pay | Admitting: Emergency Medicine

## 2020-01-25 ENCOUNTER — Ambulatory Visit (INDEPENDENT_AMBULATORY_CARE_PROVIDER_SITE_OTHER): Payer: Medicaid Other | Admitting: Emergency Medicine

## 2020-01-25 ENCOUNTER — Other Ambulatory Visit: Payer: Self-pay

## 2020-01-25 DIAGNOSIS — K219 Gastro-esophageal reflux disease without esophagitis: Secondary | ICD-10-CM

## 2020-01-25 DIAGNOSIS — Z87891 Personal history of nicotine dependence: Secondary | ICD-10-CM

## 2020-01-25 DIAGNOSIS — M4712 Other spondylosis with myelopathy, cervical region: Secondary | ICD-10-CM

## 2020-01-25 DIAGNOSIS — J449 Chronic obstructive pulmonary disease, unspecified: Secondary | ICD-10-CM

## 2020-01-25 DIAGNOSIS — C3490 Malignant neoplasm of unspecified part of unspecified bronchus or lung: Secondary | ICD-10-CM

## 2020-01-25 DIAGNOSIS — J301 Allergic rhinitis due to pollen: Secondary | ICD-10-CM | POA: Diagnosis not present

## 2020-01-25 NOTE — Assessment & Plan Note (Signed)
Congratulations on staying off cigarettes.

## 2020-01-25 NOTE — Assessment & Plan Note (Signed)
>>  ASSESSMENT AND PLAN FOR LARYNGOPHARYNGEAL REFLUX (LPR) WRITTEN ON 01/25/2020  5:03 PM BY BYRUM, LAMAR RAMAN, MD  Continue Prilosec 40 mg twice a day

## 2020-01-25 NOTE — Assessment & Plan Note (Signed)
Please continue your Anoro once daily Keep your albuterol available use 2 puffs when you need if shortness breath, chest tightness and wheezing. Follow with Dr Lamonte Sakai in 6 months or sooner if you have any problems

## 2020-01-25 NOTE — Assessment & Plan Note (Signed)
Continue your Zyrtec and Flonase nasal spray as you have been taking them
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
## 2020-01-25 NOTE — Assessment & Plan Note (Signed)
Following with [facility] for surveillance

## 2020-01-25 NOTE — Assessment & Plan Note (Signed)
Continue Prilosec 40 mg twice a day

## 2020-01-25 NOTE — Patient Instructions (Addendum)
Please continue your Anoro once daily Keep your albuterol available use 2 puffs when you need if shortness breath, chest tightness and wheezing. Continue your Zyrtec and Flonase nasal spray as you have been taking them Continue Prilosec 40 mg twice a day Finish your prednisone as prescribed by pain management Congratulations on staying off cigarettes. Follow with Dr Lamonte Sakai in 6 months or sooner if you have any problems

## 2020-01-25 NOTE — Assessment & Plan Note (Signed)
Finish your prednisone as prescribed by pain management

## 2020-01-26 ENCOUNTER — Encounter (HOSPITAL_COMMUNITY): Payer: Medicaid Other

## 2020-01-31 ENCOUNTER — Encounter (HOSPITAL_COMMUNITY): Payer: Medicaid Other

## 2020-02-02 ENCOUNTER — Encounter (HOSPITAL_COMMUNITY): Payer: Medicaid Other

## 2020-02-02 ENCOUNTER — Telehealth (HOSPITAL_COMMUNITY): Payer: Self-pay | Admitting: Family Medicine

## 2020-02-07 ENCOUNTER — Encounter (HOSPITAL_COMMUNITY): Payer: Medicare Other

## 2020-02-09 ENCOUNTER — Encounter (HOSPITAL_COMMUNITY): Payer: Medicare Other

## 2020-02-14 ENCOUNTER — Encounter (HOSPITAL_COMMUNITY)
Admission: RE | Admit: 2020-02-14 | Discharge: 2020-02-14 | Disposition: A | Discharge status: Home / Self Care | Payer: Medicare Other | Source: Ambulatory Visit | Attending: Emergency Medicine | Admitting: Emergency Medicine

## 2020-02-14 ENCOUNTER — Telehealth (HOSPITAL_COMMUNITY): Payer: Self-pay | Admitting: Family Medicine

## 2020-02-14 DIAGNOSIS — J449 Chronic obstructive pulmonary disease, unspecified: Secondary | ICD-10-CM | POA: Insufficient documentation

## 2020-02-14 NOTE — Progress Notes (Signed)
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 for meeting.               Personal Goal #2 Re-Evaluation   Personal Goal #2 Do not lie down after eating               Personal Goal #3 Re-Evaluation   Personal Goal #3 Avoid drinking caffeinated beverages              Nutrition Goals Discharge (Final Nutrition Goals Re-Evaluation):  Nutrition Goals Re-Evaluation - 01/16/20 1404      Goals   Current Weight 157 lb (71.2 kg)    Nutrition Goal Eat dinner around 5 pm before leaving for meeting.      Personal Goal #2 Re-Evaluation   Personal Goal #2 Do not lie down after eating      Personal Goal #3 Re-Evaluation   Personal Goal #3 Avoid drinking caffeinated beverages           Psychosocial: Target Goals: Acknowledge presence or absence of  significant depression and/or stress, maximize coping skills, provide positive support system. Participant is able to verbalize types and ability to use techniques and skills needed for reducing stress and depression.  Initial Review & Psychosocial Screening:  Initial Psych Review & Screening - 12/19/19 1249      Initial Review   Current issues with History of Depression;Current Depression;Current Anxiety/Panic;Current Stress Concerns;Current Sleep Concerns    Source of Stress Concerns Chronic Illness;Unable to perform yard/household activities;Unable to participate in former interests or hobbies    Comments Deontra has a history of depression and anxiety, is being treated by his PCP, does not see a Retail banker. Goes to [facility] daily and has a great support group there.      Family Dynamics   Good Support System? Yes   From [facility] support group, not thru blood relatives.     Barriers   Psychosocial barriers to participate in program The patient should benefit from training in stress management and relaxation.      Screening Interventions   Interventions Encouraged to exercise;To provide support and resources with identified psychosocial needs    Expected Outcomes Long Term Goal: Stressors or current issues are controlled or eliminated.;Long Term goal: The participant improves quality of Life and PHQ9 Scores as seen by post scores and/or verbalization of changes           Quality of Life Scores:  Scores of 19 and below usually indicate a poorer quality of life in these areas.  A difference of  2-3 points is a clinically meaningful difference.  A difference of 2-3 points in the total score of the Quality of Life Index has been associated with significant improvement in overall quality of life, self-image, physical symptoms, and general health in studies assessing change in quality of life.  PHQ-9: Recent Review Flowsheet Data    Depression screen [facility] 2/9 12/19/2019    Decreased Interest 0   Down, Depressed, Hopeless 1   PHQ - 2 Score 1   Altered sleeping 1   Tired, decreased energy 1   Change in appetite 1   Feeling bad or  failure about yourself  0   Trouble concentrating 0   Moving slowly or fidgety/restless 0   Suicidal thoughts 0   Difficult doing work/chores Somewhat difficult     Interpretation of Total Score  Total Score Depression Severity:  1-4 = Minimal depression, 5-9 = Mild depression, 10-14 = Moderate depression, 15-19 = Moderately severe depression, 20-27 = Severe depression   Psychosocial Evaluation and Intervention:  Psychosocial Evaluation - 12/19/19 1258      Psychosocial Evaluation & Interventions   Interventions Stress management education;Relaxation education;Encouraged to exercise with the program and follow exercise prescription    Comments For patient to manage stress and anxiety in healthy ways.    Expected Outcomes QOL will improve as patient is able to learn to relax and reduces his stress.    Continue Psychosocial Services  Follow up required by staff           Psychosocial Re-Evaluation:  Psychosocial Re-Evaluation    [place] Name 12/19/19 1301 12/26/19 1336 01/12/20 1115 02/13/20 1351       Psychosocial Re-Evaluation   Current issues with History of Depression;Current Depression;Current Anxiety/Panic;Current Stress Concerns Current Depression;History of Depression;Current Stress Concerns;Current Anxiety/Panic History of Depression;Current Depression;Current Stress Concerns Current Depression;History of Depression;Current Stress Concerns;Current Sleep Concerns    Comments - No change in psychosocial concerns which were identified 1 week ago at initial orientation/walk test. Is handling his chronic illness stress well, no concerns identified. Leslee has not attended pulmonary rehab for 4 weeks due to back pain, unable to evaluate psychosocial concerns.    Expected Outcomes - For Peace to be free of psychosocial  concerns while participating in pulmonary rehab. For Audel to continue to handle his stress and depression in positive ways -    Interventions - Encouraged to attend Pulmonary Rehabilitation for the exercise;Relaxation education;Stress management education - Encouraged to attend Pulmonary Rehabilitation for the exercise;Relaxation education;Stress management education    Continue Psychosocial Services  - Follow up required by staff Follow up required by staff Follow up required by staff         Initial Review   Source of Stress Concerns - Chronic Illness;Unable to participate in former interests or hobbies;Unable to perform yard/household activities Chronic Illness;Unable to perform yard/household activities;Unable to participate in former interests or hobbies Chronic Illness;Unable to participate in former interests or hobbies;Unable to perform yard/household activities           Psychosocial Discharge (Final Psychosocial Re-Evaluation):  Psychosocial Re-Evaluation - 02/13/20 1351      Psychosocial Re-Evaluation   Current issues with Current Depression;History of Depression;Current Stress Concerns;Current Sleep Concerns    Comments Braxston has not attended pulmonary rehab for 4 weeks due to back pain, unable to evaluate psychosocial concerns.    Interventions Encouraged to attend Pulmonary Rehabilitation for the exercise;Relaxation education;Stress management education    Continue Psychosocial Services  Follow up required by staff      Initial Review   Source of Stress Concerns Chronic Illness;Unable to participate in former interests or hobbies;Unable to perform yard/household activities           Education: Education Goals: Education classes will be provided on a weekly basis, covering required topics. Participant will state understanding/return demonstration of topics presented.  Learning Barriers/Preferences:  Learning Barriers/Preferences - 12/19/19 1301      Learning  Barriers/Preferences   Learning Barriers None    Learning Preferences None           Education Topics: Risk Factor Reduction:  -Group instruction  that is supported by a PowerPoint presentation. Instructor discusses the definition of a risk factor, different risk factors for pulmonary disease, and how the heart and lungs work together.     Nutrition for Pulmonary Patient:  -Group instruction provided by PowerPoint slides, verbal discussion, and written materials to support subject matter. The instructor gives an explanation and review of healthy diet recommendations, which includes a discussion on weight management, recommendations for fruit and vegetable consumption, as well as protein, fluid, caffeine, fiber, sodium, sugar, and alcohol. Tips for eating when patients are short of breath are discussed.   Pursed Lip Breathing:  -Group instruction that is supported by demonstration and informational handouts. Instructor discusses the benefits of pursed lip and diaphragmatic breathing and detailed demonstration on how to preform both.     Oxygen Safety:  -Group instruction provided by PowerPoint, verbal discussion, and written material to support subject matter. There is an overview of "What is Oxygen" and "Why do we need it".  Instructor also reviews how to create a safe environment for oxygen use, the importance of using oxygen as prescribed, and the risks of noncompliance. There is a brief discussion on traveling with oxygen and resources the patient may utilize.   Oxygen Equipment:  -Group instruction provided by [facility] Staff utilizing handouts, written materials, and equipment demonstrations.   Signs and Symptoms:  -Group instruction provided by written material and verbal discussion to support subject matter. Warning signs and symptoms of infection, stroke, and heart attack are reviewed and when to call the physician/911 reinforced. Tips for preventing the spread of infection  discussed.   Advanced Directives:  -Group instruction provided by verbal instruction and written material to support subject matter. Instructor reviews Advanced Directive laws and proper instruction for filling out document.   Pulmonary Video:  -Group video education that reviews the importance of medication and oxygen compliance, exercise, good nutrition, pulmonary hygiene, and pursed lip and diaphragmatic breathing for the pulmonary patient.   Exercise for the Pulmonary Patient:  -Group instruction that is supported by a PowerPoint presentation. Instructor discusses benefits of exercise, core components of exercise, frequency, duration, and intensity of an exercise routine, importance of utilizing pulse oximetry during exercise, safety while exercising, and options of places to exercise outside of rehab.     Pulmonary Medications:  -Verbally interactive group education provided by instructor with focus on inhaled medications and proper administration.   Anatomy and Physiology of the Respiratory System and Intimacy:  -Group instruction provided by PowerPoint, verbal discussion, and written material to support subject matter. Instructor reviews respiratory cycle and anatomical components of the respiratory system and their functions. Instructor also reviews differences in obstructive and restrictive respiratory diseases with examples of each. Intimacy, Sex, and Sexuality differences are reviewed with a discussion on how relationships can change when diagnosed with pulmonary disease. Common sexual concerns are reviewed.   MD DAY -A group question and answer session with a medical doctor that allows participants to ask questions that relate to their pulmonary disease state.   OTHER EDUCATION -Group or individual verbal, written, or video instructions that support the educational goals of the pulmonary rehab program. [place] from  01/05/2020 in [place]  Date 01/05/20  Educator Handout  [[facility] [place]  Instruction Review Code 1- Verbalizes Understanding      Holiday Eating Survival Tips:  -Group instruction provided by PowerPoint slides, verbal discussion, and written materials to support subject matter. The instructor  gives patients tips, tricks, and techniques to help them not only survive but enjoy the holidays despite the onslaught of food that accompanies the holidays.   Knowledge Questionnaire Score:  Knowledge Questionnaire Score - 12/19/19 1239      Knowledge Questionnaire Score   Pre Score 14/18           Core Components/Risk Factors/Patient Goals at Admission:  Personal Goals and Risk Factors at Admission - 12/19/19 1302      Core Components/Risk Factors/Patient Goals on Admission   Improve shortness of breath with ADL's Yes    Intervention Provide education, individualized exercise plan and daily activity instruction to help decrease symptoms of SOB with activities of daily living.    Expected Outcomes Short Term: Improve cardiorespiratory fitness to achieve a reduction of symptoms when performing ADLs;Long Term: Be able to perform more ADLs without symptoms or delay the onset of symptoms           Core Components/Risk Factors/Patient Goals Review:   Goals and Risk Factor Review    Row Name 12/19/19 1302 12/26/19 1338 01/12/20 1118 02/13/20 1352       Core Components/Risk Factors/Patient Goals Review   Personal Goals Review Increase knowledge of respiratory medications and ability to use respiratory devices properly.;Improve shortness of breath with ADL's;Develop more efficient breathing techniques such as purse lipped breathing and diaphragmatic breathing and practicing self-pacing with activity. Improve shortness of breath with ADL's;Develop more efficient breathing techniques such as purse lipped breathing and diaphragmatic breathing and practicing self-pacing  with activity.;Increase knowledge of respiratory medications and ability to use respiratory devices properly. Improve shortness of breath with ADL's;Develop more efficient breathing techniques such as purse lipped breathing and diaphragmatic breathing and practicing self-pacing with activity.;Increase knowledge of respiratory medications and ability to use respiratory devices properly. Develop more efficient breathing techniques such as purse lipped breathing and diaphragmatic breathing and practicing self-pacing with activity.;Increase knowledge of respiratory medications and ability to use respiratory devices properly.;Improve shortness of breath with ADL's    Review - Deryl will start exercising in pulmonary rehab tomorrow, too early to see progression toward program goals. Just started program, too early to make progress toward goals. Unable to participate in the exercise sessions d/t lower back pain, to graduate 02/23/2020.    Expected Outcomes - That Ja will have move forward to begin to meet program goals in the next full 30 days. For Prestin to make progress in the next full 30 days. Back pain resolves she Lacey can participate.           Core Components/Risk Factors/Patient Goals at Discharge (Final Review):   Goals and Risk Factor Review - 02/13/20 1352      Core Components/Risk Factors/Patient Goals Review   Personal Goals Review Develop more efficient breathing techniques such as purse lipped breathing and diaphragmatic breathing and practicing self-pacing with activity.;Increase knowledge of respiratory medications and ability to use respiratory devices properly.;Improve shortness of breath with ADL's    Review Unable to participate in the exercise sessions d/t lower back pain, to graduate 02/23/2020.    Expected Outcomes Back pain resolves she Gabriele can participate.           ITP Comments:   Comments: ITP REVIEW Pt is making expected progress toward pulmonary rehab  goals after completing 3 sessions. Recommend continued exercise, life style modification, education, and utilization of breathing techniques to increase stamina and strength and decrease shortness of breath with exertion.

## 2020-02-16 ENCOUNTER — Encounter (HOSPITAL_COMMUNITY): Payer: Medicare Other

## 2020-02-20 ENCOUNTER — Other Ambulatory Visit: Payer: Self-pay | Admitting: Surgery

## 2020-02-20 DIAGNOSIS — C3490 Malignant neoplasm of unspecified part of unspecified bronchus or lung: Secondary | ICD-10-CM

## 2020-02-21 ENCOUNTER — Encounter (HOSPITAL_COMMUNITY): Payer: Medicare Other

## 2020-02-22 ENCOUNTER — Ambulatory Visit: Payer: Medicare Other | Admitting: Surgery

## 2020-02-23 ENCOUNTER — Telehealth: Payer: Self-pay | Admitting: Emergency Medicine

## 2020-02-23 ENCOUNTER — Encounter (HOSPITAL_COMMUNITY): Payer: Medicare Other

## 2020-02-23 DIAGNOSIS — J449 Chronic obstructive pulmonary disease, unspecified: Secondary | ICD-10-CM

## 2020-02-23 MED ORDER — PREDNISONE 10 MG PO TABS: ORAL_TABLET | ORAL | 0 refills | Status: DC

## 2020-02-23 NOTE — Telephone Encounter (Signed)
Called and spoke with Patient.  Patient stated he received Brian,NP's message and has picked up Prednisone from pharmacy. Reviewed Brian's recommendations.  Understanding stated. Nothing further at this time.

## 2020-02-23 NOTE — Telephone Encounter (Signed)
02/23/2020  If patient symptoms worsen he needs to seek emergent evaluation in urgent care or emergency room.  Okay to prescribe:  Prednisone 10mg  tablet  >>>4 tabs for 2 days, then 3 tabs for 2 days, 2 tabs for 2 days, then 1 tab for 2 days, then stop >>>take with food  >>>take in the morning   Patient needs to keep planned follow-up with Dr. Lamonte Sakai in [place], [place]

## 2020-02-23 NOTE — Telephone Encounter (Signed)
02/23/20   Prednisone taper sent. Attempted to call pt. Unable to reach. Left VM. Pt aware to seek emergent eval if symptoms worsen. Pt aware to contact our office with questions.   Will leave in triage for one more attempt on 02/24/20  Wyn Quaker FNP

## 2020-02-23 NOTE — Telephone Encounter (Signed)
Primary Pulmonologist: Dr Lamonte Sakai Last office visit and with whom: 01/25/20-Dr. Byrum What do we see them for (pulmonary problems): COPD Last OV assessment/plan:  Instructions  Please continue your Anoro once daily Keep your albuterol available use 2 puffs when you need if shortness breath, chest tightness and wheezing. Continue your Zyrtec and Flonase nasal spray as you have been taking them Continue Prilosec 40 mg twice a day Finish your prednisone as prescribed by pain management Congratulations on staying off cigarettes. Follow with Dr Lamonte Sakai in 6 months or sooner if you have any problems          Reason for call:  Patient stated he is having increase wheezing today, productive cough, with dark, thick, green sputum.  Patient stated he has sob, but it unchanged from his normal.  Patient is on 2L O2 [place] at all times.  Patient denies any fever, chills,  or body aches.   Patient denies any covid exposures.  Patient is fully covid vaccinated and flu vaccinated.  Patient stated Dr. Lamonte Sakai has previously given him short doses of Prednisone in the past for wheezing. Patient requested prescriptions to go to [facility].   No Known Allergies  Immunization History  Administered Date(s) Administered  . Influenza Whole 10/05/2010  . Influenza,inj,Quad PF,6+ Mos 11/28/2018  . Influenza-Unspecified 09/21/2019  . PFIZER(Purple Top)SARS-COV-2 Vaccination 04/07/2019, 05/07/2019  . Pneumococcal-Unspecified 11/28/2018    Message routed [employer]

## 2020-03-02 ENCOUNTER — Telehealth: Payer: Self-pay | Admitting: Emergency Medicine

## 2020-03-06 NOTE — Progress Notes (Signed)
Discharge Progress Report  Patient Details  Name: [name] MRN: [id number] Date of Birth: 1955-03-20 Referring Provider:   April [facility]on [facility] Walk Test from 12/19/2019 in [place]  Referring Provider Dr. Lamonte Sakai       Number of Visits: 3  Reason for Discharge:  Early Exit:  Lack of attendance and back pain prevented him from participating in pulmonary rehab.  Smoking History:  Social History   Tobacco Use  Smoking Status Former Smoker  . Packs/day: 1.00  . Years: 45.00  . Pack years: 45.00  . Types: Cigarettes  . Start date: 47  . Quit date: 06/01/2019  . Years since quitting: 0.7  Smokeless Tobacco Never Used    Diagnosis:  Chronic obstructive pulmonary disease, unspecified COPD type ([place])  ADL UCSD:  Pulmonary Assessment Scores    Row Name 12/19/19 1239 12/19/19 1327       ADL UCSD   ADL Phase Entry Entry    SOB Score total 37 --         CAT Score   CAT Score 18 --         mMRC Score   mMRC Score -- 4           Initial Exercise Prescription:  Initial Exercise Prescription - 12/19/19 1300      Date of Initial Exercise RX and Referring Provider   Date 12/19/19    Referring Provider Dr. Lamonte Sakai    Expected Discharge Date 02/23/20      Oxygen   Oxygen Continuous    Liters 2      Bike   Level 1.5    Minutes 15      NuStep   Level 2    Minutes 15      Prescription Details   Frequency (times per week) 2    Duration Progress to 30 minutes of continuous aerobic without signs/symptoms of physical distress      Intensity   THRR 40-80% of Max Heartrate 62-125    Ratings of Perceived Exertion 11-13    Perceived Dyspnea 0-4      Progression   Progression Continue to progress workloads to maintain intensity without signs/symptoms of physical distress.      Resistance Training   Training Prescription Yes    Weight Orange bands    Reps 10-15           Discharge Exercise Prescription  (Final Exercise Prescription Changes):  Exercise Prescription Changes - 01/17/20 1400      Response to Exercise   Blood Pressure (Admit) 100/62    Blood Pressure (Exercise) 120/70    Blood Pressure (Exit) 94/60    Heart Rate (Admit) 66 bpm    Heart Rate (Exercise) 74 bpm    Heart Rate (Exit) 76 bpm    Oxygen Saturation (Admit) 95 %    Oxygen Saturation (Exercise) 92 %    Oxygen Saturation (Exit) 94 %    Rating of Perceived Exertion (Exercise) 9    Perceived Dyspnea (Exercise) 1    Duration Progress to 45 minutes of aerobic exercise without signs/symptoms of physical distress    Intensity THRR unchanged      Progression   Progression Continue to progress workloads to maintain intensity without signs/symptoms of physical distress.      Resistance Training   Training Prescription Yes    Weight Orange bands    Reps 10-15    Time 10 Minutes  Oxygen   Oxygen Continuous    Liters 2      Bike   Level 1.5    Minutes 15    METs 2.5      NuStep   Level 2    SPM 80    Minutes 15    METs 1.9           Functional Capacity:  6 Minute Walk    Row Name 12/19/19 1320         6 Minute Walk   Phase Initial     Distance 895 feet     Walk Time 6 minutes     # of Rest Breaks 0     MPH 1.69     METS 2.57     RPE 10     Perceived Dyspnea  2     VO2 Peak 8.99     Symptoms Yes (comment)     Comments Oxygen saturation dropped to 83% by the 3rd minute. Put him on 2L of oxygen and sats remained above 88% for the remainder of the walk test     Resting HR 50 bpm     Resting BP 92/60     Resting Oxygen Saturation  91 %     Exercise Oxygen Saturation  during 6 min walk 83 %     Max Ex. HR 90 bpm     Max Ex. BP 100/78     2 Minute Post BP 92/60           Interval HR   1 Minute HR 76     2 Minute HR 85     3 Minute HR 90     4 Minute HR 86     5 Minute HR 86     6 Minute HR 80     2 Minute Post HR 51     Interval Heart Rate? Yes           Interval Oxygen    Interval Oxygen? Yes     Baseline Oxygen Saturation % 91 %     1 Minute Oxygen Saturation % 90 %     1 Minute Liters of Oxygen 0 L     2 Minute Oxygen Saturation % 88 %     2 Minute Liters of Oxygen 0 L     3 Minute Oxygen Saturation % 83 %     3 Minute Liters of Oxygen 0 L     4 Minute Oxygen Saturation % 90 %     4 Minute Liters of Oxygen 2 L     5 Minute Oxygen Saturation % 90 %     5 Minute Liters of Oxygen 2 L     6 Minute Oxygen Saturation % 91 %     6 Minute Liters of Oxygen 2 L     2 Minute Post Oxygen Saturation % 96 %     2 Minute Post Liters of Oxygen 2 L            Psychological, QOL, Others - Outcomes: PHQ 2/9: Depression screen PHQ 2/9 12/19/2019  Decreased Interest 0  Down, Depressed, Hopeless 1  PHQ - 2 Score 1  Altered sleeping 1  Tired, decreased energy 1  Change in appetite 1  Feeling bad or failure about yourself  0  Trouble concentrating 0  Moving slowly or fidgety/restless 0  Suicidal thoughts 0  Difficult doing work/chores Somewhat difficult  Some recent data might be  hidden    Quality of Life:   Personal Goals: Goals established at orientation with interventions provided to work toward goal.  Personal Goals and Risk Factors at Admission - 12/19/19 1302      Core Components/Risk Factors/Patient Goals on Admission   Improve shortness of breath with ADL's Yes    Intervention Provide education, individualized exercise plan and daily activity instruction to help decrease symptoms of SOB with activities of daily living.    Expected Outcomes Short Term: Improve cardiorespiratory fitness to achieve a reduction of symptoms when performing ADLs;Long Term: Be able to perform more ADLs without symptoms or delay the onset of symptoms            Personal Goals Discharge:  Goals and Risk Factor Review    Row Name 12/19/19 1302 12/26/19 1338 01/12/20 1118 02/13/20 1352       Core Components/Risk Factors/Patient Goals Review   Personal Goals Review  Increase knowledge of respiratory medications and ability to use respiratory devices properly.;Improve shortness of breath with ADL's;Develop more efficient breathing techniques such as purse lipped breathing and diaphragmatic breathing and practicing self-pacing with activity. Improve shortness of breath with ADL's;Develop more efficient breathing techniques such as purse lipped breathing and diaphragmatic breathing and practicing self-pacing with activity.;Increase knowledge of respiratory medications and ability to use respiratory devices properly. Improve shortness of breath with ADL's;Develop more efficient breathing techniques such as purse lipped breathing and diaphragmatic breathing and practicing self-pacing with activity.;Increase knowledge of respiratory medications and ability to use respiratory devices properly. Develop more efficient breathing techniques such as purse lipped breathing and diaphragmatic breathing and practicing self-pacing with activity.;Increase knowledge of respiratory medications and ability to use respiratory devices properly.;Improve shortness of breath with ADL's    Review -- Jahmeir will start exercising in pulmonary rehab tomorrow, too early to see progression toward program goals. Just started program, too early to make progress toward goals. Unable to participate in the exercise sessions d/t lower back pain, to graduate 02/23/2020.    Expected Outcomes -- That Bevin will have move forward to begin to meet program goals in the next full 30 days. For Doctor to make progress in the next full 30 days. Back pain resolves she Ibrohim can participate.           Exercise Goals and Review:  Exercise Goals    Row Name 12/19/19 1317             Exercise Goals   Increase Physical Activity Yes       Intervention Provide advice, education, support and counseling about physical activity/exercise needs.;Develop an individualized exercise prescription for aerobic and  resistive training based on initial evaluation findings, risk stratification, comorbidities and participant's personal goals.       Expected Outcomes Short Term: Attend rehab on a regular basis to increase amount of physical activity.;Long Term: Add in home exercise to make exercise part of routine and to increase amount of physical activity.;Long Term: Exercising regularly at least 3-5 days a week.       Increase Strength and Stamina Yes       Intervention Provide advice, education, support and counseling about physical activity/exercise needs.;Develop an individualized exercise prescription for aerobic and resistive training based on initial evaluation findings, risk stratification, comorbidities and participant's personal goals.       Expected Outcomes Short Term: Increase workloads from initial exercise prescription for resistance, speed, and METs.;Short Term: Perform resistance training exercises routinely during rehab and add in resistance training at  home;Long Term: Improve cardiorespiratory fitness, muscular endurance and strength as measured by increased METs and functional capacity (6MWT)       Able to understand and use rate of perceived exertion (RPE) scale Yes       Intervention Provide education and explanation on how to use RPE scale       Expected Outcomes Short Term: Able to use RPE daily in rehab to express subjective intensity level;Long Term:  Able to use RPE to guide intensity level when exercising independently       Able to understand and use Dyspnea scale Yes       Intervention Provide education and explanation on how to use Dyspnea scale       Expected Outcomes Short Term: Able to use Dyspnea scale daily in rehab to express subjective sense of shortness of breath during exertion;Long Term: Able to use Dyspnea scale to guide intensity level when exercising independently       Knowledge and understanding of Target Heart Rate Range (THRR) Yes       Intervention Provide education and  explanation of THRR including how the numbers were predicted and where they are located for reference       Expected Outcomes Short Term: Able to state/look up THRR;Long Term: Able to use THRR to govern intensity when exercising independently;Short Term: Able to use daily as guideline for intensity in rehab       Understanding of Exercise Prescription Yes       Intervention Provide education, explanation, and written materials on patient's individual exercise prescription       Expected Outcomes Short Term: Able to explain program exercise prescription;Long Term: Able to explain home exercise prescription to exercise independently              Exercise Goals Re-Evaluation:  Exercise Goals Re-Evaluation    Row Name 01/17/20 0815 02/14/20 0805           Exercise Goal Re-Evaluation   Exercise Goals Review Increase Physical Activity;Increase Strength and Stamina;Able to understand and use rate of perceived exertion (RPE) scale;Able to understand and use Dyspnea scale;Knowledge and understanding of Target Heart Rate Range (THRR);Understanding of Exercise Prescription Increase Physical Activity;Increase Strength and Stamina;Able to understand and use rate of perceived exertion (RPE) scale;Able to understand and use Dyspnea scale;Knowledge and understanding of Target Heart Rate Range (THRR);Understanding of Exercise Prescription      Comments Pt has completed 3 exercise sessions and has tolerated well so far. Too early to see progression. He is exercising at 1.9 METS on the Nustep and 2.3 METS on the Upright bike. Will continue to monitor and progress as he is able. Pt has only completed 3 exercise sessions and has not been to rehab in a month due to chronic back pain. Pt is scheduled to graduate from the program on 02/23/20. Will continue to follow and monitor.      Expected Outcomes Through exercise at rehab and home the patient will decrease shortness of breath with daily activities and feel confident  in carrying out an exercise regimn at home. Through exercise at rehab and home the patient will decrease shortness of breath with daily activities and feel confident in carrying out an exercise regimn at home.             Nutrition & Weight - Outcomes:  Pre Biometrics - 12/19/19 1319      Pre Biometrics   Grip Strength 37 kg  Nutrition:  Nutrition Therapy & Goals - 01/16/20 1403      Nutrition Therapy   Diet General Healthful      Personal Nutrition Goals   Nutrition Goal Eat dinner around 5 pm before leaving for meeting.    Personal Goal #2 Do not lie down after eating    Personal Goal #3 Avoid drinking caffeinated beverages      Intervention Plan   Intervention Prescribe, educate and counsel regarding individualized specific dietary modifications aiming towards targeted core components such as weight, hypertension, lipid management, diabetes, heart failure and other comorbidities.;Nutrition handout(s) given to patient.    Expected Outcomes Short Term Goal: Understand basic principles of dietary content, such as calories, fat, sodium, cholesterol and nutrients.           Nutrition Discharge:   Education Questionnaire Score:  Knowledge Questionnaire Score - 12/19/19 1239      Knowledge Questionnaire Score   Pre Score 14/18           Goals reviewed with patient; copy given to patient.

## 2020-03-06 NOTE — Addendum Note (Signed)
Encounter addended by: Lance Morin, RN on: 03/06/2020 3:52 PM  Actions taken: Clinical Note Signed, Episode resolved

## 2020-03-13 NOTE — Telephone Encounter (Signed)
lmtcb for pt.  

## 2020-03-14 ENCOUNTER — Ambulatory Visit: Payer: Medicare Other | Admitting: Surgery

## 2020-03-14 MED ORDER — ANORO ELLIPTA 62.5-25 MCG/INH IN AEPB
1.0000 | INHALATION_SPRAY | Freq: Every day | RESPIRATORY_TRACT | 5 refills | Status: DC
Start: 2020-03-14 — End: 2020-05-15

## 2020-03-14 NOTE — Telephone Encounter (Signed)
Left message for patient to call back. I did look at his last RX and it looks like he needed a refill. Will await his call back.

## 2020-03-20 ENCOUNTER — Encounter: Payer: Self-pay | Admitting: Emergency Medicine

## 2020-03-20 NOTE — Telephone Encounter (Signed)
We have tried to reach pt several times without success. Will send letter to pt to call us back.   Triage, please mail letter to pts home (already created). Can close encounter once complete.

## 2020-03-20 NOTE — Telephone Encounter (Signed)
Letter printed and mailed to the pt  °

## 2020-04-04 ENCOUNTER — Ambulatory Visit
Admission: RE | Admit: 2020-04-04 | Discharge: 2020-04-04 | Disposition: A | Discharge status: Home / Self Care | Payer: Medicare Other | Source: Ambulatory Visit | Attending: Surgery | Admitting: Surgery

## 2020-04-04 ENCOUNTER — Ambulatory Visit (INDEPENDENT_AMBULATORY_CARE_PROVIDER_SITE_OTHER): Payer: Medicare Other | Admitting: Surgery

## 2020-04-04 ENCOUNTER — Other Ambulatory Visit: Payer: Self-pay | Admitting: Urology

## 2020-04-04 ENCOUNTER — Encounter: Payer: Self-pay | Admitting: Surgery

## 2020-04-04 ENCOUNTER — Other Ambulatory Visit: Payer: Self-pay

## 2020-04-04 VITALS — BP 105/67 | HR 83 | Resp 20 | Ht 69.0 in | Wt 157.0 lb

## 2020-04-04 DIAGNOSIS — Z85118 Personal history of other malignant neoplasm of bronchus and lung: Secondary | ICD-10-CM

## 2020-04-04 DIAGNOSIS — C3490 Malignant neoplasm of unspecified part of unspecified bronchus or lung: Secondary | ICD-10-CM

## 2020-04-04 DIAGNOSIS — D49511 Neoplasm of unspecified behavior of right kidney: Secondary | ICD-10-CM

## 2020-04-04 NOTE — Progress Notes (Signed)
HPI:  Patient returns for routinesurveillancehaving undergoneRight VATS with wedge resection of a 1.4 cm RUL lung noduleon 07/03/2016. The final pathology showed a poorly differentiated adenocarcinoma with negative surgical margins.This is his second lung cancer resection having undergoneleft upper lobectomy on 06/12/2011 for a stage IB (T2A, N0, M0) non-small cell lung cancer consistent with adenocarcinoma. This was a 2.0 cm tumor with visceral pleural invasion. He was found to have a small left renal lesion on abdominal ultrasound in September done for right upper quadrant and epigastric pain. CT scan of the abdomen confirmed a 2.1 x 1.3 cm lesion in the upper aspect of the left kidney concerning for a small renal neoplasm. He subsequently underwent an MRI of the abdomen which confirmed a 2.5 cm mass in the upper pole left kidney suspicious for renal cell carcinoma.  He underwent robotic assisted laparoscopic left partial nephrectomy by Dr. Lovena Neighbours on 05/24/2019.  His final pathology showed renal cell carcinoma with negative margins.  He said that he was also diagnosed with a small lesion in the right kidney which is being followed by Dr. Lovena Neighbours.  He is apparently supposed to have a follow-up abdominal scan which has not been done yet.  He has chronic back pain from degenerative spine disease and prior surgery on his cervical spine.  He had an MRI of the thoracic spine on 08/20/2019 which showed degenerative changes below his prior cervical fusion involving the upper thoracic spine.  He reports pain around both sides of his chest in a radicular distribution with a feeling of intermittent swelling in the epigastrium.  He is on Lyrica for chronic pain.  He said that he continued to abstain from smoking after his kidney surgery last April.  Current Outpatient Medications  Medication Sig Dispense Refill  . acetaminophen (TYLENOL) 500 MG tablet Take 500 mg by mouth every 6 (six) hours as needed for  mild pain or headache.     . albuterol (PROVENTIL HFA) 108 (90 Base) MCG/ACT inhaler INHALE 2 PUFFS BY MOUTH EVERY 6 HOURS AS NEEDED FOR WHEEZING OR SHORTNESS OF BREATH (Patient taking differently: Inhale 2 puffs into the lungs every 6 (six) hours as needed (wheezing/shortness of breath.).) 18 g 3  . carboxymethylcellulose (REFRESH PLUS) 0.5 % SOLN Place 1 drop into both eyes 3 (three) times daily as needed.    . cetirizine (ZYRTEC) 10 MG tablet Take 10 mg by mouth daily.    [name] escitalopram (LEXAPRO) 10 MG tablet Take 10 mg by mouth daily as needed (anxiety).     . eszopiclone (LUNESTA) 1 MG TABS tablet Take 1 tablet (1 mg total) by mouth at bedtime as needed for sleep. Take immediately before bedtime 30 tablet 0  . FLUoxetine (PROZAC) 10 MG capsule Take 10 mg by mouth daily.    . fluticasone (FLONASE) 50 MCG/ACT nasal spray Place 2 sprays into both nostrils daily.     [name] guaiFENesin (MUCINEX) 600 MG 12 hr tablet Take 600 mg by mouth 2 (two) times daily as needed (wheezing/cough).    . hydrOXYzine (ATARAX/VISTARIL) 25 MG tablet Take 25 mg by mouth every 6 (six) hours as needed for anxiety.     . meloxicam (MOBIC) 7.5 MG tablet Take by mouth.    [name] omeprazole (PRILOSEC) 40 MG capsule Take 1 capsule (40 mg total) by mouth 2 (two) times daily before a meal. 60 capsule 3  . ondansetron (ZOFRAN-ODT) 8 MG disintegrating tablet Place 8 mg inside cheek daily as needed for nausea/vomiting.    [name]  OXYGEN Inhale 2 L into the lungs at bedtime.    . predniSONE (DELTASONE) 10 MG tablet 4 tabs for 2 days, then 3 tabs for 2 days, 2 tabs for 2 days, then 1 tab for 2 days, then stop 20 tablet 0  . pregabalin (LYRICA) 75 MG capsule Take 75 mg by mouth daily.    . promethazine (PHENERGAN) 12.5 MG tablet Take 1 tablet (12.5 mg total) by mouth every 4 (four) hours as needed for nausea or vomiting. 15 tablet 0  . tamsulosin (FLOMAX) 0.4 MG CAPS capsule Take 0.4 mg by mouth daily.    . temazepam (RESTORIL) 30 MG capsule Take 1  capsule (30 mg total) by mouth at bedtime as needed for sleep. 30 capsule 0  . umeclidinium-vilanterol (ANORO ELLIPTA) 62.5-25 MCG/INH AEPB Inhale 1 puff into the lungs daily. 60 each 5   No current facility-administered medications for this visit.     Physical Exam: BP 105/67 (BP Location: Right Arm, Patient Position: Sitting)   Pulse 83   Resp 20   Ht 5\' 9"  (1.753 m)   Wt 157 lb (71.2 kg)   SpO2 91%   BMI 23.18 kg/m  He looks well. There is no cervical or supraclavicular adenopathy. Lungs are clear. Bilateral chest scars look normal.  There are no chest wall abnormalities.  Diagnostic Tests:   Narrative & Impression  CLINICAL DATA:  Lung cancer.  EXAM: CHEST - 2 VIEW  COMPARISON:  February 22, 2019.  FINDINGS: Postsurgical changes are seen involving the left hilum consistent with history of left upper lobectomy. No pneumothorax or pleural effusion is noted. No consolidative process is noted. Bony thorax is unremarkable.  IMPRESSION: Postsurgical changes seen involving the left hilum consistent with history of left upper lobectomy.   Electronically Signed   By: Marijo Conception M.D.   On: 04/04/2020 15:24      Impression:  There is no chest x-ray evidence of recurrent or metastatic lung cancer.  I have recommended that he have a follow-up CT scan of the chest in 6 months for lung cancer surveillance.  The etiology of his bilateral chest wall pain is unclear.  It sounds radicular in nature and he had this pain after his prior chest surgery probably related to the intercostal nerves.  That pain resolved and that they be unusual at that would recur.  I am not sure if his degenerative spine disease has anything to do with that pain.  Plan:  I will see him back in 6 months with a CT scan of the chest.  I spent 20 minutes performing this established patient evaluation and > 50% of this time was spent face to face counseling and coordinating the surveillance  of his previously resected lung cancers.   Gaye Pollack, MD [facility] [phone]

## 2020-04-16 ENCOUNTER — Telehealth: Payer: Self-pay | Admitting: Emergency Medicine

## 2020-04-16 MED ORDER — DOXYCYCLINE HYCLATE 100 MG PO TABS: 100.0000 mg | ORAL_TABLET | Freq: Two times a day (BID) | ORAL | 0 refills | Status: DC

## 2020-04-16 MED ORDER — PREDNISONE 10 MG PO TABS: ORAL_TABLET | ORAL | 0 refills | Status: DC

## 2020-04-16 NOTE — Telephone Encounter (Signed)
Call returned to patient, confirmed DOB. Patient reports increased wheezing for last 2 weeks. He reports dry cough. He is using his Anoro daily and his albuterol prn. Denies chest pain. He does confirm that he is out of his prednisone 10mg . Denies fevers. He has not recently been tested for covid. Requesting prednisone and antibiotic.   RB please advise.

## 2020-04-16 NOTE — Telephone Encounter (Signed)
Spoke with the pt and notified of response per RB  Her verbalized understanding  I have sent rxs to pharm  Scheduled ov with Aaron Edelman for 04/27/20

## 2020-04-16 NOTE — Telephone Encounter (Signed)
His scheduled prednisone was for myelopathy. We last treated him with a taper. It sounds like he needs another taper (as below) and then we will need to determine whether he needs everyday maintenance pred.   Prednisone >> Take 40mg  daily for 3 days, then 30mg  daily for 3 days, then 20mg  daily for 3 days, then 10mg  daily for 3 days, then stop Doxycycline 100mg  bid x 7 days  Needs an OV to discuss with me how he did on the pred, whether we need to continue low dose chronically.
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
## 2020-04-27 ENCOUNTER — Ambulatory Visit (INDEPENDENT_AMBULATORY_CARE_PROVIDER_SITE_OTHER): Payer: Medicare Other

## 2020-04-27 ENCOUNTER — Other Ambulatory Visit: Payer: Self-pay

## 2020-04-27 ENCOUNTER — Ambulatory Visit (INDEPENDENT_AMBULATORY_CARE_PROVIDER_SITE_OTHER): Payer: Medicare Other | Admitting: Pulmonary Disease

## 2020-04-27 ENCOUNTER — Encounter: Payer: Self-pay | Admitting: Pulmonary Disease

## 2020-04-27 VITALS — BP 110/78 | HR 71 | Temp 98.0°F | Ht 69.0 in | Wt 161.2 lb

## 2020-04-27 DIAGNOSIS — R059 Cough, unspecified: Secondary | ICD-10-CM

## 2020-04-27 DIAGNOSIS — J449 Chronic obstructive pulmonary disease, unspecified: Secondary | ICD-10-CM

## 2020-04-27 DIAGNOSIS — K219 Gastro-esophageal reflux disease without esophagitis: Secondary | ICD-10-CM

## 2020-04-27 DIAGNOSIS — J301 Allergic rhinitis due to pollen: Secondary | ICD-10-CM

## 2020-04-27 DIAGNOSIS — Z79899 Other long term (current) drug therapy: Secondary | ICD-10-CM | POA: Insufficient documentation

## 2020-04-27 MED ORDER — TRELEGY ELLIPTA 100-62.5-25 MCG/INH IN AEPB: 1.0000 | INHALATION_SPRAY | Freq: Every day | RESPIRATORY_TRACT | 0 refills | Status: DC

## 2020-04-27 MED ORDER — LEVOFLOXACIN 500 MG PO TABS: 500.0000 mg | ORAL_TABLET | Freq: Every day | ORAL | 0 refills | Status: DC

## 2020-04-27 MED ORDER — PREDNISONE 10 MG PO TABS: ORAL_TABLET | ORAL | 0 refills | Status: DC

## 2020-04-27 NOTE — Assessment & Plan Note (Addendum)
Using oxygen 2L QHS and occasional during the day. Currently using Anoro inhaler.   PFTs 10/27/2019(pt had used his morning Anoro prior to test): FEV1/FVC 46%,FEV1 56%, DLCO 26%.    Plan: Hold Anoro, trial of Trelegy Ellipta for 30 days.  Continue oxygen use to keep O2 sats>88%, try to obtain pulse oximeter Use rescue inhaler when needed for shortness of breath

## 2020-04-27 NOTE — Patient Instructions (Addendum)
You were seen today by Lauraine Rinne, NP  for:   1. Cough  - Respiratory or Resp and Sputum Culture; Future - AFB Culture & Smear; Future - Culture, fungus without smear; Future - predniSONE (DELTASONE) 10 MG tablet; Take 2 tablets (20mg  total) daily for the next 5 days. Take in the AM with food.  Dispense: 10 tablet; Refill: 0 - levofloxacin (LEVAQUIN) 500 MG tablet; Take 1 tablet (500 mg total) by mouth daily.  Dispense: 7 tablet; Refill: 0 - DG Chest 2 View; Future  2. Chronic obstructive pulmonary disease, unspecified COPD type ([place])  - Fluticasone-Umeclidin-Vilant (TRELEGY ELLIPTA) 100-62.5-25 MCG/INH AEPB; Inhale 1 puff into the lungs daily.  Dispense: 2 each; Refill: 0 - predniSONE (DELTASONE) 10 MG tablet; Take 2 tablets (20mg  total) daily for the next 5 days. Take in the AM with food.  Dispense: 10 tablet; Refill: 0 - levofloxacin (LEVAQUIN) 500 MG tablet; Take 1 tablet (500 mg total) by mouth daily.  Dispense: 7 tablet; Refill: 0  Hold Anoro Ellipta  Trial of Trelegy Ellipta  Trelegy Ellipta  >>> 1 puff daily in the morning >>>rinse mouth out after use  >>> This inhaler contains 3 medications that help manage her respiratory status, contact our office if you cannot afford this medication or unable to remain on this medication  Note your daily symptoms > remember "red flags" for COPD:   >>>Increase in cough >>>increase in sputum production >>>increase in shortness of breath or activity  intolerance.   If you notice these symptoms, please call the office to be seen.   3. Allergic rhinitis due to pollen, unspecified seasonality  Continue take Zyrtec daily  Start nasal saline rinses twice daily Use distilled water Shake well Get bottle lukewarm like a baby bottle  Continue to take hydroxyzine 25 mg 1 to 2 tablets at night  4. Gastroesophageal reflux disease, unspecified whether esophagitis present 5. Laryngopharyngeal reflux (LPR)  - Ambulatory referral to  Gastroenterology  Continue omeprazole  GERD management: >>>Avoid laying flat until 2 hours after meals >>>Elevate head of the bed including entire chest >>>Reduce size of meals and amount of fat, acid, spices, caffeine and sweets >>>If you are smoking, Please stop! >>>Decrease alcohol consumption >>>Work on maintaining a healthy weight with normal BMI     6. Medication management   Schedule appointment with primary care to review medications in hopes to be able to reduce some of your medications We recommend today:  Orders Placed This Encounter  Procedures  . Respiratory or Resp and Sputum Culture    Standing Status:   Future    Standing Expiration Date:   04/27/2021  . AFB Culture & Smear    Standing Status:   Future    Standing Expiration Date:   04/27/2021  . Culture, fungus without smear    Standing Status:   Future    Standing Expiration Date:   04/27/2021  . DG Chest 2 View    Standing Status:   Future    Number of Occurrences:   1    Standing Expiration Date:   08/27/2020    Order Specific Question:   Reason for Exam (SYMPTOM  OR DIAGNOSIS REQUIRED)    Answer:   cough    Order Specific Question:   Preferred imaging location?    Answer:   Internal    Order Specific Question:   Radiology Contrast Protocol - do NOT remove file path    Answer:   [email or web address]  . Ambulatory referral  to Gastroenterology    Referral Priority:   Routine    Referral Type:   Consultation    Referral Reason:   Specialty Services Required    Number of Visits Requested:   1   Orders Placed This Encounter  Procedures  . Respiratory or Resp and Sputum Culture  . AFB Culture & Smear  . Culture, fungus without smear  . DG Chest 2 View  . Ambulatory referral to Gastroenterology   Meds ordered this encounter  Medications  . Fluticasone-Umeclidin-Vilant (TRELEGY ELLIPTA) 100-62.5-25 MCG/INH AEPB    Sig: Inhale 1 puff into the lungs  daily.    Dispense:  2 each    Refill:  0    Order Specific Question:   Lot Number?    Answer:   [id number]    Order Specific Question:   Expiration Date?    Answer:   10/04/2021    Order Specific Question:   Manufacturer?    Answer:   [employer] [[id number]]    Order Specific Question:   Quantity    Answer:   2  . predniSONE (DELTASONE) 10 MG tablet    Sig: Take 2 tablets (20mg  total) daily for the next 5 days. Take in the AM with food.    Dispense:  10 tablet    Refill:  0  . levofloxacin (LEVAQUIN) 500 MG tablet    Sig: Take 1 tablet (500 mg total) by mouth daily.    Dispense:  7 tablet    Refill:  0    Follow Up:    Return in about 4 weeks (around 05/25/2020), or if symptoms worsen or fail to improve, for Follow up with Dr. Lamonte Sakai.   Notification of test results are managed in the following manner: If there are  any recommendations or changes to the  plan of care discussed in office today,  we will contact you and let you know what they are. If you do not hear from [place], then your results are normal and you can view them through your  MyChart account , or a letter will be sent to you. Thank you again for trusting [place] with your care  - Thank you, [facility]    It is flu season:   >>> Best ways to protect herself from the flu: Receive the yearly flu vaccine, practice good hand hygiene washing with soap and also using hand sanitizer when available, eat a nutritious meals, get adequate rest, hydrate appropriately       Please contact the office if your symptoms worsen or you have concerns that you are not improving.   Thank you for choosing [facility] for your healthcare, and for allowing [place] to partner with you on your healthcare journey. I am thankful to be able to provide care to you today.   Wyn Quaker FNP-C

## 2020-04-27 NOTE — Assessment & Plan Note (Signed)
Patient verbalized if he could decrease the number of medications he takes on daily basis.      Plan:  Encouraged patient to contact PCP and schedule appt.to discuss if there can be a reduction in his medications.

## 2020-04-27 NOTE — Assessment & Plan Note (Signed)
Rhinorrhea and post nasal drainage   Plan: Continue Flonase nasal spray Continue Zyrtec daily   Start nasal saline rinses twice daily Continue to take hydroxyzine 25 mg 1 to 2 tablets at night

## 2020-04-27 NOTE — Assessment & Plan Note (Addendum)
Currently using Omeprazole 40mg  BID prescribed by his PCP. He continues to have GERD symptoms.   Plan: Continue Omeprazole BID Ambulatory referral to GI

## 2020-04-27 NOTE — Assessment & Plan Note (Addendum)
Continues to have chest congestion and cough, mostly at night. Rescue inhaler improves symptoms some per patient.   Sputum is more productive  in the morning of thick, green mucous.   Plan: CXR today Sputum specimen containers given for AFB, Fungus, and resp. cultures Levaquin 500mg  QD for 7 days.

## 2020-04-27 NOTE — Assessment & Plan Note (Addendum)
>>  ASSESSMENT AND PLAN FOR GERD (GASTROESOPHAGEAL REFLUX DISEASE) WRITTEN ON 04/27/2020  4:21 PM BY Kiandra Sanguinetti, RN  Currently using Omeprazole  40mg  BID prescribed by his PCP. He continues to have GERD symptoms.   Plan: Continue Omeprazole  BID Ambulatory referral to GI   >>ASSESSMENT AND PLAN FOR LARYNGOPHARYNGEAL REFLUX (LPR) WRITTEN ON 04/27/2020  4:20 PM BY Ali Mclaurin, RN  Currently using Omeprazole  40mg  BID prescribed by his PCP. He continues to have GERD symptoms.   Plan: Continue Omeprazole  BID Ambulatory referral to GI

## 2020-05-01 ENCOUNTER — Encounter: Payer: Self-pay | Admitting: *Deleted

## 2020-05-04 ENCOUNTER — Other Ambulatory Visit: Payer: Self-pay | Admitting: Emergency Medicine

## 2020-05-11 ENCOUNTER — Ambulatory Visit
Admission: RE | Admit: 2020-05-11 | Discharge: 2020-05-11 | Disposition: A | Discharge status: Home / Self Care | Payer: Medicare Other | Source: Ambulatory Visit | Attending: Urology | Admitting: Urology

## 2020-05-11 ENCOUNTER — Other Ambulatory Visit: Payer: Self-pay

## 2020-05-11 DIAGNOSIS — D49511 Neoplasm of unspecified behavior of right kidney: Secondary | ICD-10-CM

## 2020-05-11 MED ORDER — GADOBENATE DIMEGLUMINE 529 MG/ML IV SOLN
15.0000 mL | Freq: Once | INTRAVENOUS | Status: AC | PRN
  Administered 2020-05-11: 15 mL via INTRAVENOUS

## 2020-05-15 ENCOUNTER — Telehealth: Payer: Self-pay | Admitting: Emergency Medicine

## 2020-05-15 DIAGNOSIS — J449 Chronic obstructive pulmonary disease, unspecified: Secondary | ICD-10-CM

## 2020-05-15 MED ORDER — PREDNISONE 10 MG PO TABS: ORAL_TABLET | ORAL | 0 refills | Status: AC

## 2020-05-15 MED ORDER — TRELEGY ELLIPTA 100-62.5-25 MCG/INH IN AEPB: 1.0000 | INHALATION_SPRAY | Freq: Every day | RESPIRATORY_TRACT | 5 refills | Status: DC

## 2020-05-15 NOTE — Telephone Encounter (Signed)
I am ok trying a repeat pred taper as below.  He needs to go ahead and start the pred, be set up for OV w me or APP to trouble shoot  Pred >> Take 40mg  daily for 3 days, then 30mg  daily for 3 days, then 20mg  daily for 3 days, then 10mg  daily for 3 days, then stop

## 2020-05-15 NOTE — Telephone Encounter (Signed)
Called and spoke with pt letting him know that [place] said for [place] to send another Rx for prednisone to pharmacy for him and he verbalized understanding. Also scheduled pt an appt with [facility] 4/19 as it was first avail so we could reevaluate after beginning prednisone but also stated to him to make sure he kept f/u with [facility] as scheduled in May 2022. Pt verbalized understanding. Nothing further needed.

## 2020-05-15 NOTE — Telephone Encounter (Signed)
Called and spoke with pt who states he is no better after his last OV that he had with Wyn Quaker, NP on 04/27/20. Pt was prescribed levaquin and prednisone at that [place].  Pt said that he still has complaints of wheezing as well as a rattling sound in chest and in throat.  Pt also said that his breathing has been affected as well.  Pt has not been able to to bring sputum sample to our office yet.  Along with the wheezing and increased SOB, pt said that he does also currently have a cough and will occ cough up some phlegm.  Pt denies any complaints of fever.  Pt is using Trelegy daily. States that he has been using his Albuterol inhaler at least 2-3 times a day which will only help for a short amount of time.  Pt has been wearing his O2 only at night and states that he has been surviving throughout the day and has not had to wear it during the day yet.  Due to not being any better since last OV, pt wants to know what we recommend to help with his symptoms. Dr. Lamonte Sakai, please advise.

## 2020-05-22 ENCOUNTER — Encounter: Payer: Self-pay | Admitting: Primary Care

## 2020-05-22 ENCOUNTER — Other Ambulatory Visit: Payer: Self-pay

## 2020-05-22 ENCOUNTER — Ambulatory Visit (INDEPENDENT_AMBULATORY_CARE_PROVIDER_SITE_OTHER): Payer: Medicare Other | Admitting: Primary Care

## 2020-05-22 VITALS — BP 120/78 | HR 89 | Temp 97.2°F | Ht 69.0 in | Wt 160.6 lb

## 2020-05-22 DIAGNOSIS — R1310 Dysphagia, unspecified: Secondary | ICD-10-CM | POA: Diagnosis not present

## 2020-05-22 DIAGNOSIS — R059 Cough, unspecified: Secondary | ICD-10-CM | POA: Diagnosis not present

## 2020-05-22 DIAGNOSIS — R1319 Other dysphagia: Secondary | ICD-10-CM

## 2020-05-22 DIAGNOSIS — J449 Chronic obstructive pulmonary disease, unspecified: Secondary | ICD-10-CM
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
## 2020-05-22 NOTE — Assessment & Plan Note (Addendum)
-   GERD symptoms + dysphagia. Concern for aspiration.  - Continue omeprazole 40mg  twice daily - Refer back to Gastroenterology

## 2020-05-22 NOTE — Assessment & Plan Note (Signed)
-   Patient reports improvement in his breathing with Trelegy 100. Cough for last month not improving. Associated fatigue. Pulmonary function testing in September 2021 showed severe obstruction without bronchodilator response. CXR in March 2022 showed no acute process. We will order CT chest wo contrast d/t persistent cough. Checking sputum cultures x 3. AFB is pending. Holding off on further abx at this time. Adding mucinex and flutter valve for airway clearance. If he needs additional prednisone course he will notify our office. FU in May with Dr. Lamonte Sakai.

## 2020-05-22 NOTE — Patient Instructions (Addendum)
Recommendations: - Continue Trelegy one puff daily in the morning - Use albuterol rescue inhaler 2 puffs every 4-6 hours for breakthrough shortness of breath/wheeizng - Use flutter valve three times a day for chest congestion  - Take mucinex 2 times a day  - Thank you for providing sputum sample today, we need another specimen when able to - Call Dr. Early Osmond office to schedule appointment- Phone: [phone]   Referral: - Gastroenterology re: dysphagia   Orders: - CT chest wo contrast re: persistent cough   Follow-up: - May with Dr. Lamonte Sakai as scheduled

## 2020-05-28 ENCOUNTER — Telehealth: Payer: Self-pay | Admitting: Emergency Medicine

## 2020-05-28 MED ORDER — PREDNISONE 10 MG PO TABS: ORAL_TABLET | ORAL | 0 refills | Status: DC

## 2020-05-28 NOTE — Telephone Encounter (Signed)
Pt calling to get a refill of the prednisone taper.  He was seen in the office on 04/19 by EW>    Recommendations: - Continue Trelegy one puff daily in the morning - Use albuterol rescue inhaler 2 puffs every 4-6 hours for breakthrough shortness of breath/wheeizng - Use flutter valve three times a day for chest congestion  - Take mucinex 2 times a day  - Thank you for providing sputum sample today, we need another specimen when able to - Call Dr. Early Osmond office to schedule appointment- Phone: [phone]   Referral: - Gastroenterology re: dysphagia   Orders: - CT chest wo contrast re: persistent cough   Follow-up: - May with Dr. Lamonte Sakai as scheduled       Instructions     Return in about 4 weeks (around 06/19/2020).  Recommendations: - Continue Trelegy one puff daily in the morning - Use albuterol rescue inhaler 2 puffs every 4-6 hours for breakthrough shortness of breath/wheeizng - Use flutter valve three times a day for chest congestion  - Take mucinex 2 times a day  - Thank you for providing sputum sample today, we need another specimen when able to - Call Dr. Early Osmond office to schedule appointment- Phone: [phone]

## 2020-05-28 NOTE — Telephone Encounter (Signed)
I have called the pt and he is aware of rx that has been sent in per EW>

## 2020-05-28 NOTE — Telephone Encounter (Signed)
Sending in prednisone taper 40mg  x 2 days; 30mg  x 2 days; 20mg  x 2 days; 10mg  x 2 days

## 2020-06-05 ENCOUNTER — Other Ambulatory Visit: Payer: Self-pay

## 2020-06-05 ENCOUNTER — Ambulatory Visit
Admission: RE | Admit: 2020-06-05 | Discharge: 2020-06-05 | Disposition: A | Discharge status: Home / Self Care | Payer: Medicare Other | Source: Ambulatory Visit | Attending: Primary Care | Admitting: Primary Care

## 2020-06-05 DIAGNOSIS — R059 Cough, unspecified: Secondary | ICD-10-CM

## 2020-06-06 ENCOUNTER — Ambulatory Visit (INDEPENDENT_AMBULATORY_CARE_PROVIDER_SITE_OTHER): Payer: Medicare Other | Admitting: Emergency Medicine

## 2020-06-06 ENCOUNTER — Telehealth: Payer: Self-pay | Admitting: Emergency Medicine

## 2020-06-06 ENCOUNTER — Encounter: Payer: Self-pay | Admitting: Emergency Medicine

## 2020-06-06 DIAGNOSIS — J449 Chronic obstructive pulmonary disease, unspecified: Secondary | ICD-10-CM | POA: Diagnosis not present

## 2020-06-06 DIAGNOSIS — J301 Allergic rhinitis due to pollen: Secondary | ICD-10-CM

## 2020-06-06 DIAGNOSIS — K219 Gastro-esophageal reflux disease without esophagitis: Secondary | ICD-10-CM

## 2020-06-06 DIAGNOSIS — C3490 Malignant neoplasm of unspecified part of unspecified bronchus or lung: Secondary | ICD-10-CM | POA: Diagnosis not present

## 2020-06-06 MED ORDER — SIMETHICONE 125 MG PO TABS: 125.0000 mg | ORAL_TABLET | Freq: Three times a day (TID) | ORAL | 2 refills | Status: DC | PRN

## 2020-06-06 MED ORDER — DOXYCYCLINE HYCLATE 100 MG PO TABS: 100.0000 mg | ORAL_TABLET | Freq: Two times a day (BID) | ORAL | 3 refills | Status: AC

## 2020-06-06 MED ORDER — AZITHROMYCIN 250 MG PO TABS: 250.0000 mg | ORAL_TABLET | Freq: Every day | ORAL | 3 refills | Status: AC

## 2020-06-06 MED ORDER — CEFUROXIME AXETIL 250 MG PO TABS: 250.0000 mg | ORAL_TABLET | Freq: Two times a day (BID) | ORAL | 3 refills | Status: AC

## 2020-06-06 NOTE — Assessment & Plan Note (Signed)
Has had flaring symptoms in the last 2 months, continues to have cough but denies any significant change in rhinitis, GERD.  Suspect this is progression of his COPD.  Discussed possibly putting him on chronic prednisone but for now I would like to try to use rotating antibiotics to see if this gives him benefit, avoid prednisone if possible given the side effect profile  Please continue Trelegy 1 inhalation once daily.  Rinse and gargle after using. Keep your albuterol available use 2 puffs when needed for shortness of breath, chest tightness, wheezing. We will start rotating antibiotics for you to use for 1 week out of the month: -Azithromycin for 5 days -Doxycycline for 7 days -Cefuroxime for 7 days Follow with Dr Lamonte Sakai in 3 months or sooner if you have any problems.

## 2020-06-06 NOTE — Assessment & Plan Note (Addendum)
Having significant gas pain, was benefited by over-the-counter simethicone.  He asked if I could write him a prescription for this.  We will do so today.  Also he is interested in going back to gastroenterology for both the pain and also his persistent GERD.  Will make the referral.

## 2020-06-06 NOTE — Telephone Encounter (Signed)
I spoke with RB and he stated that the zpak should be for 2 the first day then 1 daily until gone.  The pharmacy is aware and nothing further is needed.
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
## 2020-06-06 NOTE — Assessment & Plan Note (Signed)
CT chest from 5/3 reviewed does not show any significant interval change compared with July, no evidence of recurrence or metastatic disease

## 2020-06-06 NOTE — Assessment & Plan Note (Signed)
Continue cetirizine

## 2020-06-06 NOTE — Patient Instructions (Addendum)
Please continue Trelegy 1 inhalation once daily.  Rinse and gargle after using. Keep your albuterol available use 2 puffs when needed for shortness of breath, chest tightness, wheezing. We will start rotating antibiotics for you to use for 1 week out of the month: -Azithromycin for 5 days -Doxycycline for 7 days -Cefuroxime for 7 days Your CT scan of the chest from 06/05/2020 does not show any evidence of recurrence of your lung cancer or any evidence of active infection. We will refer you back to see gastroenterology to evaluate GERD and gas pain. Try using simethicone 125 mg if needed for gas pains.  You can use up to 3 times a day Follow with [name] in 3 months or sooner if you have any problems.

## 2020-06-11 ENCOUNTER — Telehealth: Payer: Self-pay | Admitting: Emergency Medicine

## 2020-06-11 MED ORDER — PREDNISONE 10 MG PO TABS: ORAL_TABLET | ORAL | 0 refills | Status: DC

## 2020-06-11 NOTE — Telephone Encounter (Signed)
I have called and spoke with pt and he is aware of RB recs.  He did pick up all 3 abx and is aware how to use them.  He is also aware of the pred taper that has been sent to the pharmacy for him.

## 2020-06-11 NOTE — Telephone Encounter (Signed)
Clarification > the cefuroxime is 250mg  bid x 7 days, to be alternated with the abx monthly as we discussed.   Ok to send prednisone >> Take 40mg  daily for 3 days, then 30mg  daily for 3 days, then 20mg  daily for 3 days, then 10mg  daily for 3 days, then stop

## 2020-06-11 NOTE — Addendum Note (Signed)
Addended by: Elie Confer on: 06/11/2020 05:34 PM   Modules accepted: Orders

## 2020-06-11 NOTE — Telephone Encounter (Signed)
Primary Pulmonologist: Dr. Lamonte Sakai Last office visit and with whom: 06/06/2020 with Dr. Lamonte Sakai What do we see them for (pulmonary problems): COPD, Allergic Rhintis, Malignant neoplasm of lung and GERD Last OV assessment/plan: see below  Was appointment offered to patient (explain)?     Reason for call: patient called requesting prednisone be sent in. For the last 2 months, patient has been having worsen coughing with occ yellow mucous production, wheezing/chest tightness and SOB. He does not have chills and doesn't think he has had a fever. He is using his Trelegy inhaler and having to Albuterol rescue more daily. He is also requesting advice on how to take Cefuroxime that was given last OV. He believes Dr. Lamonte Sakai told him to take 1 for 7 days at the beginning of the month but the pharmacist told him to take all 3 at one time. Told patient will route to Dr. Lamonte Sakai.  Dr. Lamonte Sakai, please advise. Thanks!  (examples of things to ask: : When did symptoms start? Fever? Cough? Productive? Color to sputum? More sputum than usual? Wheezing? Have you needed increased oxygen? Are you taking your respiratory medications? What over the counter measures have you tried?)  Assessment & Plan:  COPD (chronic obstructive pulmonary disease) ([place]) Has had flaring symptoms in the last 2 months, continues to have cough but denies any significant change in rhinitis, GERD.  Suspect this is progression of his COPD.  Discussed possibly putting him on chronic prednisone but for now I would like to try to use rotating antibiotics to see if this gives him benefit, avoid prednisone if possible given the side effect profile  Please continue Trelegy 1 inhalation once daily.  Rinse and gargle after using. Keep your albuterol available use 2 puffs when needed for shortness of breath, chest tightness, wheezing. We will start rotating antibiotics for you to use for 1 week out of the month: -Azithromycin for 5 days -Doxycycline for 7  days -Cefuroxime for 7 days Follow with Dr Lamonte Sakai in 3 months or sooner if you have any problems.  Allergic rhinitis Continue cetirizine  Lung cancer ([place]) CT chest from 5/3 reviewed does not show any significant interval change compared with July, no evidence of recurrence or metastatic disease  GERD (gastroesophageal reflux disease) Having significant gas pain, was benefited by over-the-counter simethicone.  He asked if I could write him a prescription for this.  We will do so today.  Also he is interested in going back to gastroenterology for both the pain and also his persistent GERD.  Will make the referral.   Baltazar Apo, MD, PhD 06/06/2020, 2:20 PM [facility] [phone] or if no answer [phone]        No Known Allergies  Immunization History  Administered Date(s) Administered  . Influenza Whole 10/05/2010  . Influenza,inj,Quad PF,6+ Mos 11/28/2018  . Influenza-Unspecified 09/21/2019  . PFIZER(Purple Top)SARS-COV-2 Vaccination 04/07/2019, 05/07/2019  . Pneumococcal-Unspecified 11/28/2018

## 2020-06-11 NOTE — Telephone Encounter (Signed)
ATC pt, no answer and vm not set up.  Wcb.

## 2020-06-11 NOTE — Telephone Encounter (Signed)
ATC no voice mail. Will attempt to contact at later time.

## 2020-06-11 NOTE — Telephone Encounter (Signed)
pt is calling to see if Dr/ Byrum could call in Rx for prednisone states he is wheezing & coughing up phlegm. Uses [facility] on [address] Pt also needs to verify how he needs to be taking Certin, Vibra-tabs & Simethicone states that the pharmacist told him to take all three at once but he thought Dr. Lamonte Sakai told him to take them one RX at a time till finished then start the next , but he also not sure which one to start first please advise [phone]

## 2020-07-03 ENCOUNTER — Telehealth: Payer: Self-pay | Admitting: Emergency Medicine

## 2020-07-03 NOTE — Telephone Encounter (Signed)
I have called and spoke with pt and he stated that he has the large POC.  He stated that the tanks are way too large for him to carry and he is needing something smaller that he can carry this around.  He stated that he is getting to the point where he needs the oxygen during the day as well.  RB please advise. Thanks

## 2020-07-04 NOTE — Telephone Encounter (Signed)
Called and spoke with pt and he is aware of appt tomorrow for a POC qualifying walk at 2 pm.

## 2020-07-04 NOTE — Telephone Encounter (Signed)
He will need to come in for a walking O2 titration on pulsed system so a new order for lightwt POC can be sent to [facility].

## 2020-07-05 ENCOUNTER — Other Ambulatory Visit: Payer: Self-pay

## 2020-07-05 ENCOUNTER — Ambulatory Visit: Payer: Medicare Other

## 2020-07-05 ENCOUNTER — Telehealth: Payer: Self-pay | Admitting: Emergency Medicine

## 2020-07-05 DIAGNOSIS — J449 Chronic obstructive pulmonary disease, unspecified: Secondary | ICD-10-CM

## 2020-07-05 DIAGNOSIS — R918 Other nonspecific abnormal finding of lung field: Secondary | ICD-10-CM

## 2020-07-05 NOTE — Telephone Encounter (Signed)
RB please advise if we can sent in an order for his oximeter.     Pt is also stated that [facility] told him that even though he did the 6 min walk today, he will have to pay out of pocket for the POC since he is an existing pt.   They are not renting new POC to pts.

## 2020-07-09 NOTE — Telephone Encounter (Signed)
The only recommendation I can make is to obtain the smallest tanks possible. That's our only option

## 2020-07-09 NOTE — Telephone Encounter (Signed)
Tried calling pt, no answer and VM did not pick up

## 2020-07-10 NOTE — Telephone Encounter (Signed)
ATC, kept ringing busy tone, no VM

## 2020-07-11 ENCOUNTER — Other Ambulatory Visit: Payer: Self-pay | Admitting: *Deleted

## 2020-07-11 ENCOUNTER — Ambulatory Visit (INDEPENDENT_AMBULATORY_CARE_PROVIDER_SITE_OTHER): Payer: Medicare Other | Admitting: Gastroenterology

## 2020-07-11 ENCOUNTER — Encounter: Payer: Self-pay | Admitting: Gastroenterology

## 2020-07-11 VITALS — BP 100/60 | HR 70 | Ht 69.0 in | Wt 161.0 lb

## 2020-07-11 DIAGNOSIS — R1310 Dysphagia, unspecified: Secondary | ICD-10-CM

## 2020-07-11 DIAGNOSIS — R1013 Epigastric pain: Secondary | ICD-10-CM

## 2020-07-11 DIAGNOSIS — R059 Cough, unspecified: Secondary | ICD-10-CM

## 2020-07-11 DIAGNOSIS — Z85118 Personal history of other malignant neoplasm of bronchus and lung: Secondary | ICD-10-CM

## 2020-07-11 MED ORDER — SUCRALFATE 1 G PO TABS: 1.0000 g | ORAL_TABLET | Freq: Three times a day (TID) | ORAL | 1 refills | Status: DC

## 2020-07-11 NOTE — Patient Instructions (Addendum)
You have been scheduled for an Endoscopy at [facility] on 09/10/2020 at 12:45am, separate instructions have been given   We will send carafate to your pharmacy  Due to recent changes in healthcare laws, you may see the results of your imaging and laboratory studies on MyChart before your provider has had a chance to review them.  We understand that in some cases there may be results that are confusing or concerning to you. Not all laboratory results come back in the same time frame and the provider may be waiting for multiple results in order to interpret others.  Please give [place] 48 hours in order for your provider to thoroughly review all the results before contacting the office for clarification of your results.   If you are age 39 or older, your body mass index should be between 23-30. Your Body mass index is 23.78 kg/m. If this is out of the aforementioned range listed, please consider follow up with your Primary Care Provider.  If you are age 74 or younger, your body mass index should be between 19-25. Your Body mass index is 23.78 kg/m. If this is out of the aformentioned range listed, please consider follow up with your Primary Care Provider.   Crush 1 tablet and dissolve in 10 ml of warm water. Mix well to create slurry and drink  The [facility] providers would like to encourage you to use [facility] to communicate with providers for non-urgent requests or questions.  Due to long hold times on the telephone, sending your provider a message by [facility] may be a faster and more efficient way to get a response.  Please allow 48 business hours for a response.  Please remember that this is for non-urgent requests.   Thank you for choosing [facility]  Janett Billow Zehr,PA-C

## 2020-07-11 NOTE — Telephone Encounter (Signed)
Tried calling and line stiull busy  Closing per protocol
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
## 2020-07-13 ENCOUNTER — Telehealth: Payer: Self-pay | Admitting: Emergency Medicine

## 2020-07-13 NOTE — Telephone Encounter (Signed)
ATC, line kept ringing then busy tone. Could not leave VM.

## 2020-07-13 NOTE — Progress Notes (Signed)
Attending Physician's Attestation   I have reviewed the chart.   I agree with the Advanced Practitioner's note, impression, and recommendations with any updates as below. At this point, unlikely to have structural etiology for her symptoms.  Repeat endoscopy is not unreasonable as well as addition of Carafate.  If endoscopy is unremarkable then consideration of asking the provider who prescribes his escitalopram whether a TCA can be added on or if the patient can be transition to a different SSRI to see if that may treat functional discomfort that may be required.   Justice Britain, MD [facility] Advanced Endoscopy Office # [phone]

## 2020-07-15 ENCOUNTER — Other Ambulatory Visit: Payer: Self-pay

## 2020-07-15 ENCOUNTER — Emergency Department (HOSPITAL_COMMUNITY)
Admission: EM | Admit: 2020-07-15 | Discharge: 2020-07-15 | Disposition: A | Discharge status: Home / Self Care | Payer: Medicare Other | Attending: Emergency Medicine | Admitting: Emergency Medicine

## 2020-07-15 ENCOUNTER — Encounter (HOSPITAL_COMMUNITY): Payer: Self-pay

## 2020-07-15 ENCOUNTER — Emergency Department (HOSPITAL_COMMUNITY): Payer: Medicare Other

## 2020-07-15 DIAGNOSIS — Z20822 Contact with and (suspected) exposure to covid-19: Secondary | ICD-10-CM | POA: Diagnosis not present

## 2020-07-15 DIAGNOSIS — I2510 Atherosclerotic heart disease of native coronary artery without angina pectoris: Secondary | ICD-10-CM | POA: Insufficient documentation

## 2020-07-15 DIAGNOSIS — J189 Pneumonia, unspecified organism: Secondary | ICD-10-CM | POA: Insufficient documentation

## 2020-07-15 DIAGNOSIS — Z955 Presence of coronary angioplasty implant and graft: Secondary | ICD-10-CM | POA: Diagnosis not present

## 2020-07-15 DIAGNOSIS — J449 Chronic obstructive pulmonary disease, unspecified: Secondary | ICD-10-CM | POA: Diagnosis not present

## 2020-07-15 DIAGNOSIS — Z85118 Personal history of other malignant neoplasm of bronchus and lung: Secondary | ICD-10-CM | POA: Diagnosis not present

## 2020-07-15 DIAGNOSIS — R0602 Shortness of breath: Secondary | ICD-10-CM | POA: Diagnosis present

## 2020-07-15 DIAGNOSIS — Z87891 Personal history of nicotine dependence: Secondary | ICD-10-CM | POA: Diagnosis not present

## 2020-07-15 DIAGNOSIS — Z7951 Long term (current) use of inhaled steroids: Secondary | ICD-10-CM | POA: Diagnosis not present

## 2020-07-15 LAB — BASIC METABOLIC PANEL
Anion gap: 9 (ref 5–15)
BUN: 5 mg/dL — ABNORMAL LOW (ref 8–23)
CO2: 22 mmol/L (ref 22–32)
Calcium: 9.1 mg/dL (ref 8.9–10.3)
Chloride: 106 mmol/L (ref 98–111)
Creatinine, Ser: 1.16 mg/dL (ref 0.61–1.24)
GFR, Estimated: >60 mL/min (ref >60)
Glucose, Bld: 99 mg/dL (ref 70–99)
Potassium: 3.9 mmol/L (ref 3.5–5.1)
Sodium: 137 mmol/L (ref 135–145)

## 2020-07-15 LAB — CBC
HCT: 40.1 % (ref 39.0–52.0)
Hemoglobin: 12.9 g/dL — ABNORMAL LOW (ref 13.0–17.0)
MCH: 29.8 pg (ref 26.0–34.0)
MCHC: 32.2 g/dL (ref 30.0–36.0)
MCV: 92.6 fL (ref 80.0–100.0)
Platelets: 385 10*3/uL (ref 150–400)
RBC: 4.33 MIL/uL (ref 4.22–5.81)
RDW: 14.5 % (ref 11.5–15.5)
WBC: 6.2 10*3/uL (ref 4.0–10.5)
nRBC: 0 % (ref 0.0–0.2)

## 2020-07-15 LAB — TROPONIN I (HIGH SENSITIVITY)
Troponin I (High Sensitivity): 7 ng/L (ref <18)
Troponin I (High Sensitivity): 6 ng/L (ref <18)

## 2020-07-15 LAB — RESP PANEL BY RT-PCR (FLU A&B, COVID) ARPGX2
Influenza A by PCR: NEGATIVE
Influenza B by PCR: NEGATIVE
SARS Coronavirus 2 by RT PCR: NEGATIVE

## 2020-07-15 MED ORDER — LEVOFLOXACIN 750 MG PO TABS
750.0000 mg | ORAL_TABLET | Freq: Once | ORAL | Status: AC
  Administered 2020-07-15: 750 mg via ORAL
  Filled 2020-07-15: qty 1

## 2020-07-15 MED ORDER — LEVOFLOXACIN 750 MG PO TABS: 750.0000 mg | ORAL_TABLET | Freq: Every day | ORAL | 0 refills | Status: DC

## 2020-07-15 MED ORDER — METHYLPREDNISOLONE 4 MG PO TBPK: ORAL_TABLET | ORAL | 0 refills | Status: DC

## 2020-07-15 MED ORDER — LORAZEPAM 2 MG/ML IJ SOLN
1.0000 mg | Freq: Once | INTRAMUSCULAR | Status: AC
  Administered 2020-07-15: 1 mg via INTRAVENOUS
  Filled 2020-07-15: qty 1

## 2020-07-15 NOTE — ED Triage Notes (Addendum)
PT from home BIB EMS for c/o sudden onset of CP/SHOB this afternoon. Pt reports these episodes on/off x 4mo. Hx of lung CA. 324mg  ASA given PTA. Pt wears 2L [place] at baseline.
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
## 2020-07-15 NOTE — Discharge Instructions (Addendum)
Contact a health care provider if you have: A fever. Trouble sleeping because you cannot control your cough with cough medicine. Get help right away if: Your shortness of breath becomes worse. Your chest pain increases. Your sickness becomes worse, especially if you are an older adult or have a weak immune system. You cough up blood.

## 2020-07-15 NOTE — ED Notes (Signed)
First encounter with patient, patient resting in cart. Patient reporting a 3-4/10 aching pain in chest at this time. Phlebotomy to draw 2nd trop. Patient requesting anxiety medication at this time.

## 2020-07-15 NOTE — ED Notes (Signed)
Patient transported to X-ray 

## 2020-07-16 NOTE — Telephone Encounter (Signed)
Encounter will be closed after several attempts to get ahold of patient.

## 2020-07-16 NOTE — Telephone Encounter (Signed)
ATC pt no voice mail, will attempt to call at later time.

## 2020-07-16 NOTE — Telephone Encounter (Signed)
ATC no voicmail x1

## 2020-07-18 ENCOUNTER — Telehealth: Payer: Self-pay | Admitting: Emergency Medicine

## 2020-07-18 NOTE — Telephone Encounter (Signed)
Called and spoke with patient. He stated that he came into the office last week to requalify for his O2 and POC. The order was sent to [employer]. Someone from [employer] called him on the same day and informed him that [employer] no longer accepts his insurance and he would be able to get the POC if paid for it out of pocket at $2400. He wanted to see if we could do anything.   I advised him that I would check on this for him. I have sent a community message over to Ramona, Leroy Sea and Leah at [facility]. Will await their response.

## 2020-07-19 NOTE — Telephone Encounter (Signed)
Called and spoke with pt letting him know the info stated by Cherina about the POC and since he has been with [employer] as long as he has, he would have to pay OOP for POC.  Pt wanted to know if he might be able to switch to a different DME than [employer] as he stated that he has been with [employer] at least 5-6 years since they were originally [facility].  Stated to pt that we could try to see if we could switch him to a different DME to see if he could get a POC and he verbalized understanding.  Pt has been scheduled for an appt with Derl Barrow, NP 6/27 at 12pm. Pt will need to have a requalifying walk done at that [place] so we could have that info to send to new DME.  It might be best to send new DME order to [facility] if they are in network with pt's insurance as they do have POCs avail. Nothing further needed.

## 2020-07-23 ENCOUNTER — Telehealth: Payer: Self-pay | Admitting: Emergency Medicine

## 2020-07-23 NOTE — Telephone Encounter (Signed)
Pt stated that last week a friend of his dropped off a form to give Dr. Lamonte Sakai to sign off on for a transportation program which he considers it as the "SCAT Program" which is for public transportation. Pls regard; [phone]

## 2020-07-24 NOTE — Telephone Encounter (Signed)
Forms located in Dr. Agustina Caroli mail box to be filled out when he comes back to the office. Spoke with pt and informed pt that paperwork would be completed, signed and faxed to [facility] when Dr. Lamonte Sakai returns to office next week. Pt stated understanding. Will leave encounter open to document faxing

## 2020-07-24 NOTE — Telephone Encounter (Signed)
I do not see anything in pts chart showing a form drop off.   [name], did you receive anything on this pt regarding a SCAT transportation form? Thanks.

## 2020-07-25 LAB — AFB CULTURE WITH SMEAR (NOT AT ARMC)
Acid Fast Culture: NEGATIVE
Acid Fast Smear: NEGATIVE

## 2020-07-30 ENCOUNTER — Ambulatory Visit: Payer: Medicare Other | Admitting: Primary Care

## 2020-08-02 NOTE — Telephone Encounter (Signed)
Paperwork completed by Dr. Lamonte Sakai. Upon review paperwork is missing 1 pt signature. RN spoke with pt and notified him on missing signature. Pt states he will come into office to sign and after that is completed RN will fax to [facility]. Will leave encounter open for documentation.

## 2020-08-02 NOTE — Telephone Encounter (Signed)
Estill Bamberg, please advise if this has been faxed back yet. Thanks!

## 2020-08-07 NOTE — Telephone Encounter (Signed)
Paperwork faxed to [facility] with fax confirmation received by RN. Pt notified and instructed RN to mail original paperwork back to pt. Original paperwork placed into outgoing mail today. Nothing further needed at this time.

## 2020-08-13 ENCOUNTER — Telehealth: Payer: Self-pay | Admitting: Emergency Medicine

## 2020-08-13 NOTE — Telephone Encounter (Signed)
Pt stated that a representative for the SCAT program stated that they did not get page 7 and he said that she resent it back to [place] on Friday but they need it to be filled out.   Pls regard; [phone]

## 2020-08-13 NOTE — Telephone Encounter (Signed)
LMTCB

## 2020-08-14 NOTE — Telephone Encounter (Signed)
Please advise if this has been received. Thanks.

## 2020-08-15 ENCOUNTER — Ambulatory Visit (INDEPENDENT_AMBULATORY_CARE_PROVIDER_SITE_OTHER): Payer: Medicare Other | Admitting: Primary Care

## 2020-08-15 ENCOUNTER — Ambulatory Visit (INDEPENDENT_AMBULATORY_CARE_PROVIDER_SITE_OTHER): Payer: Medicare Other

## 2020-08-15 ENCOUNTER — Encounter: Payer: Self-pay | Admitting: Primary Care

## 2020-08-15 ENCOUNTER — Other Ambulatory Visit: Payer: Self-pay

## 2020-08-15 VITALS — BP 100/60 | HR 60 | Ht 69.0 in | Wt 159.0 lb

## 2020-08-15 DIAGNOSIS — J449 Chronic obstructive pulmonary disease, unspecified: Secondary | ICD-10-CM

## 2020-08-15 DIAGNOSIS — J441 Chronic obstructive pulmonary disease with (acute) exacerbation: Secondary | ICD-10-CM

## 2020-08-15 DIAGNOSIS — G4734 Idiopathic sleep related nonobstructive alveolar hypoventilation: Secondary | ICD-10-CM | POA: Diagnosis not present

## 2020-08-15 DIAGNOSIS — J9611 Chronic respiratory failure with hypoxia: Secondary | ICD-10-CM

## 2020-08-15 MED ORDER — CEFUROXIME AXETIL 250 MG PO TABS: ORAL_TABLET | ORAL | 3 refills | Status: DC

## 2020-08-15 MED ORDER — AZITHROMYCIN 250 MG PO TABS: ORAL_TABLET | ORAL | 3 refills | Status: DC

## 2020-08-15 MED ORDER — DOXYCYCLINE HYCLATE 100 MG PO TABS: ORAL_TABLET | ORAL | 3 refills | Status: DC

## 2020-08-15 NOTE — Patient Instructions (Addendum)
Recommendations: Continue Anoro Ellipta one puff daily in morning Use flutter valve three times a day to help loose mucus Take mucinex 600-1200mg  twice a day as needed for cough   We will start rotating antibiotics for you to use for 1 week out of the month: -Azithromycin for 5 days (August, November, February) -Doxycycline for 7 days September, December, March -Cefuroxime for 7 days (October, January, April)  Orders: - 6 mins walk test (fridays)   Follow-up: - Please schedule patinet for 6 minute walk test first available  - Needs follow-up in August-September with Dr. Lamonte Sakai
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
## 2020-08-16 NOTE — Telephone Encounter (Signed)
[facility] left detailed message per DPR stating [employer] fax was not located in office. Original application was mailed to pt at his requested. Informed pt if there was missing information he would have to bring original application back into office to have it completed. Instructed to call office if he had any questions.

## 2020-08-17 ENCOUNTER — Encounter: Payer: Self-pay | Admitting: Primary Care

## 2020-08-17 ENCOUNTER — Ambulatory Visit (INDEPENDENT_AMBULATORY_CARE_PROVIDER_SITE_OTHER): Payer: Medicare Other | Admitting: Primary Care

## 2020-08-17 ENCOUNTER — Other Ambulatory Visit: Payer: Self-pay

## 2020-08-17 ENCOUNTER — Telehealth: Payer: Self-pay | Admitting: Primary Care

## 2020-08-17 DIAGNOSIS — J441 Chronic obstructive pulmonary disease with (acute) exacerbation: Secondary | ICD-10-CM

## 2020-08-17 DIAGNOSIS — J9611 Chronic respiratory failure with hypoxia: Secondary | ICD-10-CM | POA: Insufficient documentation

## 2020-08-17 NOTE — Assessment & Plan Note (Addendum)
-   Patient did not qualify for daytime oxygen today, the lowest his oxygen saturation dropped to was 89% RA after 2-3 laps. Recommend he return for 6 minute walk test. He is interested in changed DME companies so that he can get POC device   ADDENDUM: Patient returned to our office on 08/17/20 for 6MWT, O2 saturation was 87% after walking 1.5 laps. He required 2L continuous and 3L pulsed to maintain O2 >90-92%

## 2020-08-17 NOTE — Assessment & Plan Note (Addendum)
>>  ASSESSMENT AND PLAN FOR COPD WITH EMPHYSEMA (HCC) WRITTEN ON 08/17/2020  6:06 PM BY Tekoa Amon, Earnstine Regal, NP  - Patient reports having a congested cough for the last several months. During his last visits Dr. Delton Coombes put him on cyclic antibiotics but it does not appear patient has been taking medication as prescribed. His lungs were clear on exam today, we will check a CXR. I have asked him to resume antibiotic regimen that Dr. Delton Coombes has previously placed him on to help prevent reoccurrence of COPD exacerbations. FU in August/September.   Recommendations: Continue Anoro Ellipta one puff daily in morning Use flutter valve three times a day to help loose mucus Take mucinex 600-1200mg  twice a day as needed for cough   We will start rotating antibiotics for you to use for 1 week out of the month: -Azithromycin for 5 days (August, November, February) -Doxycycline for 7 days September, December, March -Cefuroxime for 7 days (October, January, April)     >>ASSESSMENT AND PLAN FOR CHRONIC RESPIRATORY FAILURE WITH HYPOXIA (HCC) WRITTEN ON 09/19/2022  8:12 AM BY Saundra Gin, Earnstine Regal, NP  >>ASSESSMENT AND PLAN FOR CHRONIC RESPIRATORY FAILURE WITH HYPOXIA (HCC) WRITTEN ON 08/17/2020  6:06 PM BY Tenoch Mcclure, Earnstine Regal, NP  - Patient   >>ASSESSMENT AND PLAN FOR NOCTURNAL HYPOXEMIA WRITTEN ON 08/17/2020  6:10 PM BY Jamonica Schoff, Earnstine Regal, NP  - Patient did not qualify for daytime oxygen today, the lowest his oxygen saturation dropped to was 89% RA after 2-3 laps. Recommend he return for 6 minute walk test. He is interested in changed DME companies so that he can get POC device   ADDENDUM: Patient returned to our office on 08/17/20 for , O2 saturation was 87% after walking 1.5 laps. He required 2L continuous and 3L pulsed to maintain O2 >90-92%

## 2020-08-17 NOTE — Progress Notes (Signed)
CXR showed chronic findings of COPD and scarring at lung bases with bronchiectasis. No acute progress

## 2020-08-17 NOTE — Telephone Encounter (Signed)
I called and spoke with patient regarding CXR results. Patient verbalized understanding, nothing further needed.

## 2020-08-17 NOTE — Assessment & Plan Note (Addendum)
>>  ASSESSMENT AND PLAN FOR CHRONIC RESPIRATORY FAILURE WITH HYPOXIA (HCC) WRITTEN ON 08/17/2020  6:06 PM BY Lyden Redner, Earnstine Regal, NP  - Patient   >>ASSESSMENT AND PLAN FOR NOCTURNAL HYPOXEMIA WRITTEN ON 08/17/2020  6:10 PM BY Laurie Lovejoy, Earnstine Regal, NP  - Patient did not qualify for daytime oxygen today, the lowest his oxygen saturation dropped to was 89% RA after 2-3 laps. Recommend he return for 6 minute walk test. He is interested in changed DME companies so that he can get POC device   ADDENDUM: Patient returned to our office on 08/17/20 for , O2 saturation was 87% after walking 1.5 laps. He required 2L continuous and 3L pulsed to maintain O2 >90-92%

## 2020-08-22 ENCOUNTER — Ambulatory Visit
Admission: RE | Admit: 2020-08-22 | Discharge: 2020-08-22 | Disposition: A | Discharge status: Home / Self Care | Payer: Medicare Other | Source: Ambulatory Visit | Attending: Surgery | Admitting: Surgery

## 2020-08-22 ENCOUNTER — Encounter: Payer: Medicare Other | Admitting: Surgery

## 2020-08-22 ENCOUNTER — Other Ambulatory Visit: Payer: Self-pay

## 2020-08-22 DIAGNOSIS — Z85118 Personal history of other malignant neoplasm of bronchus and lung: Secondary | ICD-10-CM

## 2020-08-23 NOTE — Telephone Encounter (Signed)
Patient checking on [employer] form. States did not get page 7. Patient has not received application thru the mail. Patient phone number is [phone].

## 2020-08-23 NOTE — Telephone Encounter (Signed)
I called and spoke with the patient regarding message abut the [employer] form. I informed patient that according to notes on 08/07/20, the form was faxed by [place] and then he requested the original copy mailed back to him. I explained that we would need to have that back to re-fax to [employer]. Patient verbalized understanding and agreed to look for the form. If cannot find, I informed him that may have to ask for another form from [employer] to start over. Patient verbalized understanding and will let us know. Nothing further needed.

## 2020-08-27 ENCOUNTER — Telehealth: Payer: Self-pay | Admitting: Emergency Medicine

## 2020-08-27 DIAGNOSIS — G4734 Idiopathic sleep related nonobstructive alveolar hypoventilation: Secondary | ICD-10-CM

## 2020-08-27 DIAGNOSIS — J441 Chronic obstructive pulmonary disease with (acute) exacerbation: Secondary | ICD-10-CM

## 2020-08-27 NOTE — Telephone Encounter (Signed)
Pt stated that he needs [facility] to pick up their equipment because he did receive the portable oxygen concentrator from [facility] but he was informed by [facility] that Dr. Lamonte Sakai would have to sign a waiver in order for them to pick up the equipment. Pls regard; [phone]

## 2020-08-28 NOTE — Telephone Encounter (Signed)
Confirmed with patient that he no longer needs [facility] services.  Order placed.

## 2020-09-06 ENCOUNTER — Encounter: Payer: Self-pay | Admitting: Surgery

## 2020-09-06 ENCOUNTER — Ambulatory Visit (INDEPENDENT_AMBULATORY_CARE_PROVIDER_SITE_OTHER): Payer: Medicare Other | Admitting: Surgery

## 2020-09-06 ENCOUNTER — Other Ambulatory Visit: Payer: Self-pay

## 2020-09-06 VITALS — BP 128/86 | HR 50 | Resp 20 | Ht 69.0 in | Wt 156.4 lb

## 2020-09-06 DIAGNOSIS — Z902 Acquired absence of lung [part of]: Secondary | ICD-10-CM

## 2020-09-06 DIAGNOSIS — C3490 Malignant neoplasm of unspecified part of unspecified bronchus or lung: Secondary | ICD-10-CM

## 2020-09-07 ENCOUNTER — Encounter: Payer: Self-pay | Admitting: Surgery

## 2020-09-07 ENCOUNTER — Other Ambulatory Visit: Payer: Self-pay

## 2020-09-07 NOTE — Progress Notes (Signed)
HPI:  Patient returns for routine surveillance having undergone Right VATS with wedge resection of a 1.4 cm RUL lung nodule on 07/03/2016. 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 This is his second lung cancer resection having undergone left upper lobectomy on 06/12/2011 for a stage IB (T2A, N0, M0) non-small cell lung cancer consistent with adenocarcinoma. This was a 2.0 cm tumor with visceral pleural invasion. He was found to have a small left renal lesion on abdominal ultrasound in September done for right upper quadrant and epigastric pain.  CT scan of the abdomen confirmed a 2.1 x 1.3 cm lesion in the upper aspect of the left kidney concerning for a small renal neoplasm.  He subsequently underwent an MRI of the abdomen which confirmed a 2.5 cm mass in the upper pole left kidney suspicious for renal cell carcinoma.  He underwent robotic assisted laparoscopic left partial nephrectomy by Dr. Lovena Neighbours on 05/24/2019.  His final pathology showed renal cell carcinoma with negative margins.  He said that he was also diagnosed with a small lesion in the right kidney which is being followed by Dr. Lovena Neighbours.  He is on cyclic antibiotics for prevention of COPD exacerbations.  He continues to complain of some shortness of breath which is chronic as well as a congested cough but not able to bring up much mucus.   Current Outpatient Medications  Medication Sig Dispense Refill   acetaminophen (TYLENOL) 500 MG tablet Take 500 mg by mouth every 6 (six) hours as needed for mild pain or headache.      albuterol (PROVENTIL HFA) 108 (90 Base) MCG/ACT inhaler INHALE 2 PUFFS BY MOUTH EVERY 6 HOURS AS NEEDED FOR WHEEZING OR SHORTNESS OF BREATH (Patient taking differently: Inhale 2 puffs into the lungs every 6 (six) hours as needed (wheezing/shortness of breath.).) 18 g 3   azithromycin (ZITHROMAX) 250 MG tablet Take 1 tablet daily x 5 days (first week of August,  November and February) 5 tablet 3   carboxymethylcellulose (REFRESH PLUS) 0.5 % SOLN Place 1 drop into both eyes 3 (three) times daily as needed (dry eyes).     cefUROXime (CEFTIN) 250 MG tablet Take 1 tablet twice a day for 7 days (first week of October, January, April) 14 tablet 3   cetirizine (ZYRTEC) 10 MG tablet Take 10 mg by mouth daily.     doxycycline (VIBRA-TABS) 100 MG tablet Take 1 tablet twice daily for 7 days (first week of September, December, March) 14 tablet 3   escitalopram (LEXAPRO) 10 MG tablet Take 10 mg by mouth daily as needed (anxiety).      eszopiclone (LUNESTA) 1 MG TABS tablet Take 1 tablet (1 mg total) by mouth at bedtime as needed for sleep. Take immediately before bedtime (Patient not taking: Reported on 09/07/2020) 30 tablet 0   FLUoxetine (PROZAC) 10 MG capsule Take 10 mg by mouth daily as needed (Depression).     fluticasone (FLONASE) 50 MCG/ACT nasal spray Place 2 sprays into both nostrils daily.      Fluticasone-Umeclidin-Vilant (TRELEGY ELLIPTA) 100-62.5-25 MCG/INH AEPB Inhale 1 puff into the lungs daily. (Patient not taking: No sig reported) 60 each 5   guaiFENesin (MUCINEX) 600 MG 12 hr tablet Take 600 mg by mouth 2 (two) times daily as needed (wheezing/cough).     hydrOXYzine (ATARAX/VISTARIL) 25 MG tablet Take 25 mg by mouth every 6 (six) hours as needed for anxiety.      meloxicam (MOBIC) 7.5 MG tablet  Take 7.5 mg by mouth daily as needed for pain.     omeprazole (PRILOSEC) 40 MG capsule Take 1 capsule (40 mg total) by mouth 2 (two) times daily before a meal. (Patient taking differently: Take 40 mg by mouth daily.) 60 capsule 3   ondansetron (ZOFRAN-ODT) 8 MG disintegrating tablet Place 8 mg inside cheek daily as needed for nausea/vomiting.     OXYGEN Inhale 2 L into the lungs at bedtime.     pregabalin (LYRICA) 100 MG capsule Take 100 mg by mouth daily.     promethazine (PHENERGAN) 12.5 MG tablet Take 1 tablet (12.5 mg total) by mouth every 4 (four) hours as  needed for nausea or vomiting. 15 tablet 0   Simethicone 125 MG TABS Take 1 tablet (125 mg total) by mouth 3 (three) times daily as needed. (Patient taking differently: Take 125 mg by mouth 3 (three) times daily as needed (gas).) 120 tablet 2   sucralfate (CARAFATE) 1 g tablet Take 1 tablet (1 g total) by mouth 4 (four) times daily -  before meals and at bedtime. 120 tablet 1   tamsulosin (FLOMAX) 0.4 MG CAPS capsule Take 0.4 mg by mouth daily.     temazepam (RESTORIL) 30 MG capsule Take 1 capsule (30 mg total) by mouth at bedtime as needed for sleep. 30 capsule 0   umeclidinium-vilanterol (ANORO ELLIPTA) 62.5-25 MCG/INH AEPB Inhale 1 puff into the lungs daily.     ketoconazole (NIZORAL) 2 % shampoo Apply 1 application topically 2 (two) times a week.     methocarbamol (ROBAXIN) 500 MG tablet Take 500 mg by mouth daily.     No current facility-administered medications for this visit.     Physical Exam: BP 128/86 (BP Location: Left Arm, Patient Position: Sitting, Cuff Size: Normal)   Pulse (!) 50   Resp 20   Ht 5\' 9"  (1.753 m)   Wt 156 lb 6.4 oz (70.9 kg)   SpO2 92% Comment: RA  BMI 23.10 kg/m  He looks well. There is no cervical or supraclavicular adenopathy. Lung exam is clear with distant breath sounds throughout. Cardiac exam shows a regular rate and rhythm with normal heart sounds.  Diagnostic Tests:     EXAM: CT CHEST WITHOUT CONTRAST   TECHNIQUE: Multidetector CT imaging of the chest was performed following the standard protocol without IV contrast.   COMPARISON:  CT chest 06/05/2020 and 08/17/2019.   FINDINGS: Cardiovascular: Moderate atherosclerosis of the aorta, great vessels and coronary arteries. No acute vascular findings on noncontrast imaging. The heart size is normal. There is no pericardial effusion.   Mediastinum/Nodes: There are no enlarged mediastinal, hilar or axillary lymph nodes.Hilar assessment is limited by the lack of intravenous contrast,  although the hilar contours appear unchanged. The thyroid gland, trachea and esophagus demonstrate no significant findings.   Lungs/Pleura: No pleural effusion or pneumothorax. Moderate to severe centrilobular and paraseptal emphysema with central airway thickening and bibasilar scarring. There are postsurgical changes from previous left upper lobe resection and apparent wedge resection medially in the right upper lobe. There is a new small left lower lobe nodule measuring 7 x 3 mm on image 126/8. This does not appear particularly suspicious on the reformatted images and could reflect an area of postinflammatory scarring. In the right lower lobe, there is a new stellate density measuring approximately 12 x 6 mm on image 99/8. Attention on follow-up recommended. No other pulmonary nodules are seen.   Upper abdomen: The visualized upper abdomen appears stable  without acute findings. There are postsurgical changes in the upper pole of the left kidney consistent with partial nephrectomy.   Musculoskeletal/Chest wall: There is no chest wall mass or suspicious osseous finding.   IMPRESSION: 1. New small lower lobe pulmonary nodules bilaterally, nonspecific but not highly suspicious morphologically. Attention on follow-up CT in 6 months recommended. 2. Otherwise stable chest status post left upper lobectomy and apparent right upper lobe wedge resection. No typical metastases. 3. Chronic lung disease with central airway thickening, scattered scarring and severe Emphysema (ICD10-J43.9). 4. Coronary and Aortic Atherosclerosis (ICD10-I70.0).     Electronically Signed   By: Richardean Sale M.D.   On: 08/22/2020 15:57    Impression: His chest CT shows new small left lower lobe and right lower lobe pulmonary nodules that are nonspecific but not highly suspicious morphologically for new primary lung cancers or metastatic lesions.  These will require follow-up on CT in 6 months.  There is  otherwise no signs of recurrent or metastatic lung cancer.  I reviewed the CT images with him and answered his questions.  Plan: He will continue to follow-up with pulmonary medicine.  I will plan to see him back in 6 months with a CT scan of the chest without contrast to follow-up on the new lung nodules.  I spent 20 minutes performing this established patient evaluation and > 50% of this time was spent face to face counseling and coordinating the surveillance of his previous lung cancers.   Gaye Pollack, MD [facility] [phone]

## 2020-09-10 ENCOUNTER — Ambulatory Visit (HOSPITAL_COMMUNITY)
Admission: RE | Admit: 2020-09-10 | Discharge: 2020-09-10 | Disposition: A | Discharge status: Home / Self Care | Payer: Medicare Other | Source: Ambulatory Visit | Attending: Gastroenterology | Admitting: Gastroenterology

## 2020-09-10 ENCOUNTER — Ambulatory Visit (HOSPITAL_COMMUNITY): Payer: Medicare Other | Admitting: Certified Registered"

## 2020-09-10 ENCOUNTER — Encounter (HOSPITAL_COMMUNITY)
Admission: RE | Disposition: A | Discharge status: Home / Self Care | Payer: Self-pay | Source: Ambulatory Visit | Attending: Gastroenterology

## 2020-09-10 ENCOUNTER — Encounter (HOSPITAL_COMMUNITY): Payer: Self-pay | Admitting: Gastroenterology

## 2020-09-10 ENCOUNTER — Other Ambulatory Visit: Payer: Self-pay

## 2020-09-10 DIAGNOSIS — K3189 Other diseases of stomach and duodenum: Secondary | ICD-10-CM | POA: Diagnosis not present

## 2020-09-10 DIAGNOSIS — K449 Diaphragmatic hernia without obstruction or gangrene: Secondary | ICD-10-CM | POA: Diagnosis not present

## 2020-09-10 DIAGNOSIS — Z905 Acquired absence of kidney: Secondary | ICD-10-CM | POA: Diagnosis not present

## 2020-09-10 DIAGNOSIS — Z85528 Personal history of other malignant neoplasm of kidney: Secondary | ICD-10-CM | POA: Diagnosis not present

## 2020-09-10 DIAGNOSIS — K219 Gastro-esophageal reflux disease without esophagitis: Secondary | ICD-10-CM | POA: Diagnosis not present

## 2020-09-10 DIAGNOSIS — I2510 Atherosclerotic heart disease of native coronary artery without angina pectoris: Secondary | ICD-10-CM | POA: Diagnosis not present

## 2020-09-10 DIAGNOSIS — R1084 Generalized abdominal pain: Secondary | ICD-10-CM | POA: Diagnosis not present

## 2020-09-10 DIAGNOSIS — Z85118 Personal history of other malignant neoplasm of bronchus and lung: Secondary | ICD-10-CM | POA: Insufficient documentation

## 2020-09-10 DIAGNOSIS — R1310 Dysphagia, unspecified: Secondary | ICD-10-CM | POA: Diagnosis not present

## 2020-09-10 DIAGNOSIS — E785 Hyperlipidemia, unspecified: Secondary | ICD-10-CM | POA: Diagnosis not present

## 2020-09-10 DIAGNOSIS — I252 Old myocardial infarction: Secondary | ICD-10-CM | POA: Diagnosis not present

## 2020-09-10 DIAGNOSIS — J439 Emphysema, unspecified: Secondary | ICD-10-CM | POA: Insufficient documentation

## 2020-09-10 DIAGNOSIS — R059 Cough, unspecified: Secondary | ICD-10-CM

## 2020-09-10 DIAGNOSIS — Z87891 Personal history of nicotine dependence: Secondary | ICD-10-CM | POA: Diagnosis not present

## 2020-09-10 DIAGNOSIS — Z560 Unemployment, unspecified: Secondary | ICD-10-CM | POA: Insufficient documentation

## 2020-09-10 DIAGNOSIS — R1013 Epigastric pain: Secondary | ICD-10-CM

## 2020-09-10 HISTORY — PX: SAVORY DILATION: SHX5439

## 2020-09-10 HISTORY — PX: ESOPHAGOGASTRODUODENOSCOPY (EGD) WITH PROPOFOL: SHX5813

## 2020-09-10 SURGERY — ESOPHAGOGASTRODUODENOSCOPY (EGD) WITH PROPOFOL
Anesthesia: Monitor Anesthesia Care

## 2020-09-10 MED ORDER — SODIUM CHLORIDE 0.9 % IV SOLN: INTRAVENOUS | Status: DC

## 2020-09-10 MED ORDER — LIDOCAINE 2% (20 MG/ML) 5 ML SYRINGE
INTRAMUSCULAR | Status: DC | PRN
  Administered 2020-09-10: 100 mg via INTRAVENOUS

## 2020-09-10 MED ORDER — LACTATED RINGERS IV SOLN: INTRAVENOUS | Status: DC | PRN

## 2020-09-10 MED ORDER — OMEPRAZOLE 40 MG PO CPDR: 40.0000 mg | DELAYED_RELEASE_CAPSULE | Freq: Every day | ORAL | 6 refills | Status: DC

## 2020-09-10 MED ORDER — PROPOFOL 10 MG/ML IV BOLUS
INTRAVENOUS | Status: AC
  Filled 2020-09-10: qty 20

## 2020-09-10 MED ORDER — PROPOFOL 10 MG/ML IV BOLUS
INTRAVENOUS | Status: DC | PRN
  Administered 2020-09-10 (×4): 50 mg via INTRAVENOUS
  Administered 2020-09-10: 30 mg via INTRAVENOUS

## 2020-09-10 SURGICAL SUPPLY — 14 items

## 2020-09-10 NOTE — Anesthesia Preprocedure Evaluation (Signed)
Anesthesia Evaluation  Patient identified by MRN, date of birth, ID band Patient awake    Reviewed: Allergy & Precautions, NPO status , Patient's Chart, lab work & pertinent test results  Airway Mallampati: II  TM Distance: >3 FB Neck ROM: Full    Dental no notable dental hx.    Pulmonary COPD, former smoker,    Pulmonary exam normal breath sounds clear to auscultation       Cardiovascular negative cardio ROS Normal cardiovascular exam Rhythm:Regular Rate:Normal     Neuro/Psych negative neurological ROS  negative psych ROS   GI/Hepatic GERD  ,H/O cocaine and alcohol abuse   Endo/Other  negative endocrine ROS  Renal/GU negative Renal ROS  negative genitourinary   Musculoskeletal negative musculoskeletal ROS (+)   Abdominal   Peds negative pediatric ROS (+)  Hematology negative hematology ROS (+)   Anesthesia Other Findings   Reproductive/Obstetrics negative OB ROS                             Anesthesia Physical Anesthesia Plan  ASA: 3  Anesthesia Plan: MAC   Post-op Pain Management:    Induction: Intravenous  PONV Risk Score and Plan: 1 and Propofol infusion  Airway Management Planned: Simple Face Mask  Additional Equipment:   Intra-op Plan:   Post-operative Plan:   Informed Consent: I have reviewed the patients History and Physical, chart, labs and discussed the procedure including the risks, benefits and alternatives for the proposed anesthesia with the patient or authorized representative who has indicated his/her understanding and acceptance.     Dental advisory given  Plan Discussed with: CRNA and Surgeon  Anesthesia Plan Comments:         Anesthesia Quick Evaluation

## 2020-09-10 NOTE — Op Note (Signed)
[facility] Patient Name: [name] Procedure Date: 09/10/2020 MRN: [id number] Attending MD: Justice Britain , MD Date of Birth: Jun 17, 1955 CSN: [id number] Age: 65 Admit Type: Outpatient Procedure:                Upper GI endoscopy Indications:              Generalized abdominal pain, Dysphagia Providers:                Justice Britain, MD, Kary Kos RN, RN, Elspeth Cho Tech., Technician, [facility],                            CRNA Referring MD:             Fredric Dine. Jodi Mourning Zehr PA, PA Medicines:                Monitored Anesthesia Care Complications:            No immediate complications. Estimated Blood Loss:     Estimated blood loss was minimal. Procedure:                Pre-Anesthesia Assessment:                           - Prior to the procedure, a History and Physical                            was performed, and patient medications and                            allergies were reviewed. The patient's tolerance of                            previous anesthesia was also reviewed. The risks                            and benefits of the procedure and the sedation                            options and risks were discussed with the patient.                            All questions were answered, and informed consent                            was obtained. Prior Anticoagulants: The patient has                            taken no previous anticoagulant or antiplatelet                            agents except for NSAID medication. ASA Grade  Assessment: III - A patient with severe systemic                            disease. After reviewing the risks and benefits,                            the patient was deemed in satisfactory condition to                            undergo the procedure.                           After obtaining informed consent, the endoscope was                             passed under direct vision. Throughout the                            procedure, the patient's blood pressure, pulse, and                            oxygen saturations were monitored continuously. The                            [id number]-[id number] ([id number]) Olympus endoscope was introduced                            through the mouth, and advanced to the second part                            of duodenum. The upper GI endoscopy was                            accomplished without difficulty. The patient                            tolerated the procedure. Findings:      No gross lesions were noted in the entire esophagus. After the rest of       the EGD was complete, a guidewire was placed and the scope was       withdrawn. Dilation was performed with a Savary dilator with mild       resistance at 18 mm. The dilation site was examined following endoscope       reinsertion and showed, just below the UES, a moderate mucosal       disruption, complete resolution of luminal narrowing and no perforation.      The Z-line was regular and was found 40 cm from the incisors.      A 2 cm hiatal hernia was present.      Patchy very mild erythematous mucosa without bleeding was found in the       gastric antrum.      No other gross lesions were noted in the entire examined stomach.      No gross lesions were noted in the duodenal bulb, in the first portion  of the duodenum and in the second portion of the duodenum. Impression:               - No gross lesions in esophagus. Dilated with                            mucosal wrents noted just below the UES.                           - Z-line regular, 40 cm from the incisors.                           - 2 cm hiatal hernia.                           - Mild erythematous mucosa in the antrum. No other                            gross lesions in the stomach.                           - No gross lesions in the duodenal bulb, in the                             first portion of the duodenum and in the second                            portion of the duodenum. Moderate Sedation:      Not Applicable - Patient had care per Anesthesia. Recommendation:           - The patient will be observed post-procedure,                            until all discharge criteria are met.                           - Discharge patient to home.                           - Dilation diet as per protocol.                           - May continue Omeprazole 40 mg daily.                           - Liquid diet for 2-hours and if doing well then                            may do soft diet thereafter for next 72 hours.                           - If issues of dysphagia persist then would                            recommend  a Manometry.                           - If issues of pill dysphagia are still present                            then consider BS and MBS. I do not see evidence of                            a Zenker's diverticulum.                           - Consideration of SLP evaluation and ENT                            evaluation in future.                           - The findings and recommendations were discussed                            with the patient.                           - The findings and recommendations were discussed                            with the designated responsible adult. Procedure Code(s):        --- Professional ---                           [phone], Esophagogastroduodenoscopy, flexible,                            transoral; with insertion of guide wire followed by                            passage of dilator(s) through esophagus over guide                            wire Diagnosis Code(s):        --- Professional ---                           K44.9, Diaphragmatic hernia without obstruction or                            gangrene                           K31.89, Other diseases of stomach and duodenum                           R10.84,  Generalized abdominal pain                           R13.10, Dysphagia, unspecified CPT  copyright 2019 [employer]. All rights reserved. The codes documented in this report are preliminary and upon coder review may  be revised to meet current compliance requirements. Justice Britain, MD 09/10/2020 12:44:00 PM Number of Addenda: 0

## 2020-09-10 NOTE — Transfer of Care (Signed)
Immediate Anesthesia Transfer of Care Note  Patient: [name]  Procedure(s) Performed: ESOPHAGOGASTRODUODENOSCOPY (EGD) WITH PROPOFOL SAVORY DILATION  Patient Location: PACU  Anesthesia Type:MAC  Level of Consciousness: awake, alert  and sedated  Airway & Oxygen Therapy: Patient Spontanous Breathing and Patient connected to face mask oxygen  Post-op Assessment: Report given to RN and Post -op Vital signs reviewed and stable  Post vital signs: Reviewed and stable  Last Vitals:  Vitals Value Taken Time  BP 119/68 09/10/20 1241  Temp    Pulse 58 09/10/20 1242  Resp 15 09/10/20 1242  SpO2 100 % 09/10/20 1242  Vitals shown include unvalidated device data.  Last Pain:  Vitals:   09/10/20 1113  TempSrc: Oral  PainSc: 3          Complications: No notable events documented.
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
## 2020-09-10 NOTE — Anesthesia Postprocedure Evaluation (Signed)
Anesthesia Post Note  Patient: [name]  Procedure(s) Performed: ESOPHAGOGASTRODUODENOSCOPY (EGD) WITH PROPOFOL SAVORY DILATION     Patient location during evaluation: PACU Anesthesia Type: MAC Level of consciousness: awake and alert Pain management: pain level controlled Vital Signs Assessment: post-procedure vital signs reviewed and stable Respiratory status: spontaneous breathing, nonlabored ventilation, respiratory function stable and patient connected to nasal cannula oxygen Cardiovascular status: stable and blood pressure returned to baseline Postop Assessment: no apparent nausea or vomiting Anesthetic complications: no   No notable events documented.  Last Vitals:  Vitals:   09/10/20 1113 09/10/20 1241  BP: 139/90 119/68  Pulse: (!) 47 (!) 52  Resp: 12 11  Temp: 36.5 C 36.6 C  SpO2: 99% 100%    Last Pain:  Vitals:   09/10/20 1241  TempSrc: Axillary  PainSc:                  Alyn Jurney S

## 2020-09-10 NOTE — Discharge Instructions (Signed)
YOU HAD AN ENDOSCOPIC PROCEDURE TODAY: Refer to the procedure report and other information in the discharge instructions given to you for any specific questions about what was found during the examination. If this information does not answer your questions, please call [facility] office at [phone] to clarify.   YOU SHOULD EXPECT: Some feelings of bloating in the abdomen. Passage of more gas than usual. Walking can help get rid of the air that was put into your GI tract during the procedure and reduce the bloating. If you had a lower endoscopy (such as a colonoscopy or flexible sigmoidoscopy) you may notice spotting of blood in your stool or on the toilet paper. Some abdominal soreness may be present for a day or two, also.  DIET: Your first meal following the procedure should be a light meal and then it is ok to progress to your normal diet. A half-sandwich or bowl of soup is an example of a good first meal. Heavy or fried foods are harder to digest and may make you feel nauseous or bloated. Drink plenty of fluids but you should avoid alcoholic beverages for 24 hours. If you had a esophageal dilation, please see attached instructions for diet.    ACTIVITY: Your care partner should take you home directly after the procedure. You should plan to take it easy, moving slowly for the rest of the day. You can resume normal activity the day after the procedure however YOU SHOULD NOT DRIVE, use power tools, machinery or perform tasks that involve climbing or major physical exertion for 24 hours (because of the sedation medicines used during the test).   SYMPTOMS TO REPORT IMMEDIATELY: A gastroenterologist can be reached at any hour. Please call [phone]  for any of the following symptoms:   Following upper endoscopy (EGD, EUS, ERCP, esophageal dilation) Vomiting of blood or coffee ground material  New, significant abdominal pain  New, significant chest pain or pain under the shoulder blades  Painful or  persistently difficult swallowing  New shortness of breath  Black, tarry-looking or red, bloody stools  FOLLOW UP:  If any biopsies were taken you will be contacted by phone or by letter within the next 1-3 weeks. Call [phone]  if you have not heard about the biopsies in 3 weeks.  Please also call with any specific questions about appointments or follow up tests.  

## 2020-09-12 ENCOUNTER — Encounter (HOSPITAL_COMMUNITY): Payer: Self-pay | Admitting: Gastroenterology

## 2020-09-17 ENCOUNTER — Other Ambulatory Visit: Payer: Self-pay

## 2020-09-17 ENCOUNTER — Ambulatory Visit (INDEPENDENT_AMBULATORY_CARE_PROVIDER_SITE_OTHER): Payer: Medicare Other | Admitting: Emergency Medicine

## 2020-09-17 ENCOUNTER — Encounter: Payer: Self-pay | Admitting: Emergency Medicine

## 2020-09-17 DIAGNOSIS — J449 Chronic obstructive pulmonary disease, unspecified: Secondary | ICD-10-CM | POA: Diagnosis not present

## 2020-09-17 DIAGNOSIS — C3490 Malignant neoplasm of unspecified part of unspecified bronchus or lung: Secondary | ICD-10-CM

## 2020-09-17 DIAGNOSIS — J301 Allergic rhinitis due to pollen: Secondary | ICD-10-CM

## 2020-09-17 DIAGNOSIS — K219 Gastro-esophageal reflux disease without esophagitis: Secondary | ICD-10-CM | POA: Diagnosis not present

## 2020-09-17 MED ORDER — PREDNISONE 10 MG PO TABS: 10.0000 mg | ORAL_TABLET | Freq: Every day | ORAL | 0 refills | Status: DC

## 2020-09-17 NOTE — Assessment & Plan Note (Addendum)
He has a depressed affect and his most significant complaints are fatigue and low energy, poor appetite.  Unclear whether this directly relates to his severe COPD but it could.  I think will be reasonable to do a trial of maintenance prednisone to see if he gets benefit.  I will start with a low-dose  Please continue rotating antibiotics for the first week of alternating months as you have been taking. We will start prednisone 10 mg once daily.  Take this every day.  We will assess your response to this medication at your next visit Continue Anoro once daily. Keep your albuterol available to use 2 puffs when needed for shortness of breath, chest tightness, wheezing. Follow-up with APP in 1 month to assess your response to the prednisone and decide whether the dosing needs to be adjusted.

## 2020-09-17 NOTE — Patient Instructions (Addendum)
Please continue rotating antibiotics for the first week of alternating months as you have been taking. We will start prednisone 10 mg once daily.  Take this every day.  We will assess your response to this medication at your next visit Continue Anoro once daily. Keep your albuterol available to use 2 puffs when needed for shortness of breath, chest tightness, wheezing. Continue Flonase and Zyrtec as you have been taking them. Continue oxygen at 2 L/min at all times. We will plan to repeat your CT scan of the chest without contrast in January 2023. Follow-up with APP in 1 month to assess your response to the prednisone and decide whether the dosing needs to be adjusted. Follow with Dr. Lamonte Sakai in January to review your CT scan of the chest, or sooner if you have any new problems.

## 2020-09-17 NOTE — Progress Notes (Signed)
Subjective:    Patient ID: [name], male    DOB: [birth year], 65 y.o.   MRN: [id number]  COPD He complains of cough and shortness of breath. There is no wheezing. Pertinent negatives include no ear pain, fever, headaches, postnasal drip, rhinorrhea, sneezing, sore throat or trouble swallowing. His past medical history is significant for COPD.  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  Also with GERD, allergic rhinitis.  He was treated for acute exacerbations in March and then again in mid April, has been managed with Trelegy. He has been having mucous and cough, dyspnea when he lays down, when he exerts. Sx can be episodic, hears wheeze. Has cough sometimes dry. On zyrtec, minimal nasal gtt.   Repeat CT chest performed 06/05/2020 reviewed by me, shows no significant interval change compared with 08/17/2019, no evidence of recurrence or metastatic disease        ROV 09/17/20 --[name] is a 65 and has severe COPD with associated hypoxemic respiratory failure.  Also history of adenocarcinoma of the lung bilaterally postsurgical resections.  Also with GERD and allergic rhinitis. Has had multiple exacerbations, including in May 2022.  Started him on cyclical antibiotics, unclear that he was taking these reliably but now reports that he is.  He is on Anoro, felt that it helped him more than Trelegy.  Using albuterol approximately few times a week.  Omeprazole 40 mg daily  Reports today that for last 2 weeks he has had fatigue, poor appetite. No fevers. Some increase in his nasal congestion. More SOB when he is supine, feels that he has to sit up. O2 is set on 2L/min. Coughs daily, clear-to-green.   CT chest 08/22/2020 reviewed by me, shows evidence of prior left upper lobe and right upper lobe resections.  Small left lower lobe pulmonary nodule 7 x 3 mm of unclear  significance (new), 12 x 6 mm right lower lobe nodule.                                                                                                                                           Review of Systems  Constitutional:  Negative for fever and unexpected weight change.  HENT:  Positive for congestion. Negative for dental problem, ear pain, nosebleeds, postnasal drip, rhinorrhea, sinus pressure, sneezing, sore throat and trouble swallowing.   Eyes:  Negative for redness and itching.  Respiratory:  Positive for cough and shortness of breath. Negative for chest tightness and wheezing.   Cardiovascular:  Positive for palpitations. Negative for leg swelling.  Gastrointestinal:  Negative for nausea and vomiting.  Genitourinary:  Negative for dysuria.  Musculoskeletal:  Negative for joint swelling.  Skin:  Negative for rash.  Neurological:  Negative for headaches.  Hematological:  Does not bruise/bleed easily.  Psychiatric/Behavioral:  Positive for dysphoric mood. The patient is nervous/anxious.         Objective:   Physical Exam  Vitals:   09/17/20 1630 09/17/20 1632  BP: 124/78   Pulse:  83  Temp:  98.5 F (36.9 C)  TempSrc:  Oral  SpO2:  97%  Weight:  153 lb 12.8 oz (69.8 kg)  Height:  5\' 9"  (1.753 m)   Gen: Pleasant, well-nourished, in no distress, depressed affect  ENT: No lesions,  mouth clear,  oropharynx clear, no postnasal drip  Neck: No JVD, no stridor  Lungs: No use of accessory muscles, clear without rales or rhonchi, no wheeze  Cardiovascular: RRR, heart sounds normal, no murmur or gallops, no peripheral edema  Musculoskeletal: no deformities   Neuro: alert, non focal  Skin: Warm, no lesions or rashes     Assessment & Plan:  COPD (chronic obstructive pulmonary disease) ([place]) He has a depressed affect and his most significant complaints are fatigue and low energy, poor appetite.  Unclear whether this directly relates to his severe COPD but it could.   I think will be reasonable to do a trial of maintenance prednisone to see if he gets benefit.  I will start with a low-dose  Please continue rotating antibiotics for the first week of alternating months as you have been taking. We will start prednisone 10 mg once daily.  Take this every day.  We will assess your response to this medication at your next visit Continue Anoro once daily. Keep your albuterol available to use 2 puffs when needed for shortness of breath, chest tightness, wheezing. Follow-up with APP in 1 month to assess your response to the prednisone and decide whether the dosing needs to be adjusted.  Allergic rhinitis Continue Flonase and Zyrtec as you have been taking them.  Lung cancer ([place]) 2 new pulmonary nodules noted on his most recent CT from 08/22/2020.  No clear evidence for recurrence.  We will plan to follow his CT in 6 months, January 2023.  GERD (gastroesophageal reflux disease) Continue PPI as ordered   Baltazar Apo, MD, PhD 09/17/2020, 5:46 PM [facility] [phone] or if no answer [phone]

## 2020-09-17 NOTE — Assessment & Plan Note (Signed)
Continue PPI as ordered

## 2020-09-17 NOTE — Assessment & Plan Note (Signed)
2 new pulmonary nodules noted on his most recent CT from 08/22/2020.  No clear evidence for recurrence.  We will plan to follow his CT in 6 months, January 2023.

## 2020-09-17 NOTE — Assessment & Plan Note (Signed)
Continue Flonase and Zyrtec as you have been taking them.

## 2020-10-10 ENCOUNTER — Telehealth: Payer: Self-pay | Admitting: Emergency Medicine

## 2020-10-11 NOTE — Telephone Encounter (Signed)
Forwarding to [place] to fu on, thanks!

## 2020-10-12 NOTE — Telephone Encounter (Signed)
Paperwork received.

## 2020-10-17 ENCOUNTER — Ambulatory Visit: Payer: Medicare Other | Admitting: Primary Care

## 2020-10-17 NOTE — Telephone Encounter (Signed)
[employer] paperwork completed and signed. Paperwork faxed to [facility] and confirmation received. Pt called and made aware of fax and that copy of application was placed in mail for pt. Nothing further needed at this time.

## 2020-10-18 ENCOUNTER — Telehealth: Payer: Self-pay | Admitting: Emergency Medicine

## 2020-10-18 MED ORDER — PREDNISONE 10 MG PO TABS: 10.0000 mg | ORAL_TABLET | Freq: Every day | ORAL | 0 refills | Status: DC

## 2020-10-18 NOTE — Telephone Encounter (Signed)
I have sent in refill for prednisone to preferred pharmacy, I called and notified patient and patient verbalized understanding, nothing further needed.

## 2020-10-23 ENCOUNTER — Ambulatory Visit: Payer: Medicare Other | Admitting: Primary Care

## 2020-10-24 ENCOUNTER — Encounter: Payer: Self-pay | Admitting: Primary Care

## 2020-10-24 ENCOUNTER — Other Ambulatory Visit: Payer: Self-pay

## 2020-10-24 ENCOUNTER — Ambulatory Visit (INDEPENDENT_AMBULATORY_CARE_PROVIDER_SITE_OTHER): Payer: Medicare Other | Admitting: Primary Care

## 2020-10-24 DIAGNOSIS — C3490 Malignant neoplasm of unspecified part of unspecified bronchus or lung: Secondary | ICD-10-CM | POA: Diagnosis not present

## 2020-10-24 DIAGNOSIS — J449 Chronic obstructive pulmonary disease, unspecified: Secondary | ICD-10-CM | POA: Diagnosis not present

## 2020-10-24 DIAGNOSIS — G4734 Idiopathic sleep related nonobstructive alveolar hypoventilation: Secondary | ICD-10-CM

## 2020-10-24 MED ORDER — DOXYCYCLINE HYCLATE 100 MG PO TABS: ORAL_TABLET | ORAL | 3 refills | Status: DC

## 2020-10-24 MED ORDER — CEFUROXIME AXETIL 250 MG PO TABS: ORAL_TABLET | ORAL | 3 refills | Status: DC

## 2020-10-24 MED ORDER — PREDNISONE 10 MG PO TABS: 10.0000 mg | ORAL_TABLET | Freq: Every day | ORAL | 3 refills | Status: DC

## 2020-10-24 MED ORDER — GUAIFENESIN 200 MG/5ML PO LIQD: 10.0000 mL | Freq: Four times a day (QID) | ORAL | 1 refills | Status: DC | PRN

## 2020-10-24 MED ORDER — AZITHROMYCIN 250 MG PO TABS: ORAL_TABLET | ORAL | 3 refills | Status: DC

## 2020-10-24 NOTE — Assessment & Plan Note (Addendum)
-   Continue POC 3L as needed with exertion and nocturnal oxygen  - Provided patient with contact information for DME company to address bill

## 2020-10-24 NOTE — Assessment & Plan Note (Addendum)
>>  ASSESSMENT AND PLAN FOR COPD WITH EMPHYSEMA ([facility]CC) WRITTEN ON 10/24/2020  5:44 PM BY Tattiana Fakhouri, Earnstine Regal, NP  - Patient noticed improvement in fatigue and appetite with addition of low dose prednisone  - Continue rotating antibiotics for the first week of alternating months (Azithromycin, Ceftin, Doxycycline) - Continue prednisone 10 mg once daily  - Continue Anoro once daily - Continue Guaifenesin 10ml (400mg ) every 6 hours for cough (stop taking mucinex DM)  >>ASSESSMENT AND PLAN FOR CHRONIC RESPIRATORY FAILURE WITH HYPOXIA ([facility]CC) WRITTEN ON 09/19/2022  8:12 AM BY DEAN, EMILY, DO  >>ASSESSMENT AND PLAN FOR NOCTURNAL HYPOXEMIA WRITTEN ON 10/24/2020  5:35 PM BY Paullette Mckain, Earnstine Regal, NP  - Continue POC 3L as needed with exertion and nocturnal oxygen  - Provided patient with contact information for DME company to address bill
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
## 2020-10-24 NOTE — Assessment & Plan Note (Signed)
>>  ASSESSMENT AND PLAN FOR NOCTURNAL HYPOXEMIA WRITTEN ON 10/24/2020  5:35 PM BY WALSH, Earnstine Regal, NP  - Continue POC 3L as needed with exertion and nocturnal oxygen  - Provided patient with contact information for DME company to address bill

## 2020-10-24 NOTE — Patient Instructions (Addendum)
Continue rotating antibiotics for the first week of alternating months as you have been taking - Azithromycin take first week of August, November, February - Ceftin take first week of October, January, April - Doxycycline take first week of September, December, March  Continue prednisone 10 mg once daily  Continue Anoro once daily Continue Robitussin 10mg  every 6 hours for cough - stop taking mucinex DM  Call PCP and make an apt for blood pressure management   Follow-up: 3 months with Dr. Lamonte Sakai

## 2020-10-24 NOTE — Assessment & Plan Note (Signed)
2 new pulmonary nodules noted on his most recent CT from 08/22/2020.  No clear evidence for recurrence.  We will plan to follow his CT in 6 months, January 2023.

## 2020-11-13 ENCOUNTER — Telehealth: Payer: Self-pay | Admitting: Gastroenterology

## 2020-11-13 NOTE — Telephone Encounter (Signed)
Placed a call to the pt line rings then goes busy will try later.

## 2020-11-13 NOTE — Telephone Encounter (Signed)
Patient called to schedule appt with Dr. Jerilynn Mages because he's been having black stools, going to the bathroom 3-4 times a day (which is not his norm). He has an appt scheduled for 11/11 at 1:50 p.m. and wanted to know if there was anything he should/could do in the interim.  Please call.

## 2020-11-14 NOTE — Telephone Encounter (Signed)
Tried again to reach the pt and the line rings several times then goes busy.

## 2020-11-15 NOTE — Telephone Encounter (Signed)
I have tried to reach the pt on several occasions without success.  Will await further communication from the pt.

## 2020-11-21 ENCOUNTER — Other Ambulatory Visit: Payer: Self-pay | Admitting: Student in an Organized Health Care Education/Training Program

## 2020-11-21 DIAGNOSIS — R221 Localized swelling, mass and lump, neck: Secondary | ICD-10-CM

## 2020-11-22 ENCOUNTER — Ambulatory Visit (INDEPENDENT_AMBULATORY_CARE_PROVIDER_SITE_OTHER): Payer: Medicare Other | Admitting: Student

## 2020-11-22 ENCOUNTER — Encounter: Payer: Self-pay | Admitting: Student

## 2020-11-22 ENCOUNTER — Other Ambulatory Visit: Payer: Self-pay

## 2020-11-22 VITALS — BP 131/81 | HR 70 | Temp 97.8°F | Ht 69.0 in | Wt 162.6 lb

## 2020-11-22 DIAGNOSIS — I700 Atherosclerosis of aorta: Secondary | ICD-10-CM | POA: Diagnosis not present

## 2020-11-22 DIAGNOSIS — G894 Chronic pain syndrome: Secondary | ICD-10-CM

## 2020-11-22 DIAGNOSIS — J432 Centrilobular emphysema: Secondary | ICD-10-CM | POA: Diagnosis not present

## 2020-11-22 DIAGNOSIS — R1310 Dysphagia, unspecified: Secondary | ICD-10-CM

## 2020-11-22 DIAGNOSIS — R070 Pain in throat: Secondary | ICD-10-CM

## 2020-11-22 DIAGNOSIS — F1411 Cocaine abuse, in remission: Secondary | ICD-10-CM
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
## 2020-11-22 NOTE — Patient Instructions (Signed)
Thank you, [name] for allowing [place] to provide your care today. Today we discussed your throat problems, back pain, neck pain and COPD.  We are gathering more information from your previous provider.  We are referring you to ENT.  We want you to follow-up with [place] next week so we can continue addressing your problems.  For your throat issues, take over-the-counter throat lozenges as needed for relief.  Continue taking your sucralfate and omeprazole for your GERD.  I will call if any are abnormal. All of your labs can be accessed through "My Chart".  I have place a referrals to ENT   My Chart Access: [email or web address]  Please follow-up in 1 week  Please make sure to arrive 15 minutes prior to your next appointment. If you arrive late, you may be asked to reschedule.    We look forward to seeing you next time. Please call our clinic at [phone] if you have any questions or concerns. The best time to call is Monday-Friday from 9am-4pm, but there is someone available 24/7. If after hours or the weekend, call the main hospital number and ask for the Internal Medicine Resident On-Call. If you need medication refills, please notify your pharmacy one week in advance and they will send [place] a request.   Thank you for letting us take part in your care. Wishing you the best!  Lacinda Axon, MD 11/22/2020, 4:01 PM IM Resident, PGY-2 [facility] 41:10

## 2020-11-23 ENCOUNTER — Telehealth: Payer: Self-pay | Admitting: Emergency Medicine

## 2020-11-23 ENCOUNTER — Encounter: Payer: Self-pay | Admitting: Student

## 2020-11-23 NOTE — Assessment & Plan Note (Signed)
Recent appointment with pain medicine on 10/18.  Continue with Lyrica, Mobic and Robaxin.

## 2020-11-23 NOTE — Assessment & Plan Note (Signed)
Patient continues to have issues with his throat. Patient has had these complaints since 2019. He reports feeling of choking and mucus getting stuck in his throat. He endorses associated sinus headaches. He denies dysphagia, fever, sore throat but states he has some difficulty with swallowing pills. Recent endoscopy in August 2022 showed 2 cm hiatal hernia but no gross lesions in the esophagus.   Plan: -- ENT referral -- Advised to try OTC throat lozenges -- Continue simethicone, Carafate and Prilosec -- Follow-up GI appointment on November 11th

## 2020-11-23 NOTE — Assessment & Plan Note (Signed)
History of prior cocaine use many years ago.  States he has not had any in the last 39 years.

## 2020-11-23 NOTE — Assessment & Plan Note (Signed)
Stable. Patient on 2 L O2 at bedtime. Follows with Dr. Malvin Johns at [facility] pulmonology.  -- Continue prednisone 10 mg daily  -- Continue Anoro Ellipta once daily -- Continue albuterol as needed -- Pulmonology appointment on 01/15/21

## 2020-11-26 NOTE — Telephone Encounter (Signed)
Called and spoke with patient. He stated that he received a letter from the [employer] program stating that they were needing Part B of the application. They need to have this form by Oct 28th. I was able to find the form downstairs in [facility]. He is aware that I will refax the entire application to [employer] and will follow up with them tomorrow.   Will leave this encounter open for follow up.

## 2020-11-27 ENCOUNTER — Telehealth: Payer: Self-pay | Admitting: Primary Care

## 2020-11-27 NOTE — Telephone Encounter (Signed)
Last OV 10/24/20 with BW stated for pt to continue taking Anoro.  Beth, please advise if you would still rather for pt to continue taking Anoro daily or if you would prefer for pt to switch to Trelegy.

## 2020-11-27 NOTE — Telephone Encounter (Signed)
Called patient but he did not answer.   Per his chart, he was prescribed Trelegy by Aaron Edelman on 04/27/20. Trelegy was refilled on 05/15/20. When he saw RB on 06/06/20 he advised him to continue on Trelegy. I reviewed all of the telephone notes to see where he was switched to Anoro and could not find any. Anoro was mentioned for the first time in the AVS from visit on 08/15/20 with Beth to continue Anoro.

## 2020-11-27 NOTE — Progress Notes (Signed)
Internal Medicine Clinic Attending  Case discussed with Dr. [place]  At the time of the visit.  We reviewed the resident's history and exam and pertinent patient test results.  I agree with the assessment, diagnosis, and plan of care documented in the resident's note.  

## 2020-11-27 NOTE — Telephone Encounter (Signed)
My last note says for him to continue Anoro. Can you find out who prescribed trelegy and when?

## 2020-11-28 MED ORDER — TRELEGY ELLIPTA 100-62.5-25 MCG/ACT IN AEPB
1.0000 | INHALATION_SPRAY | Freq: Every day | RESPIRATORY_TRACT | 1 refills | Status: DC
Start: 2020-11-28 — End: 2021-01-15

## 2020-11-28 NOTE — Telephone Encounter (Signed)
Called patient but he did not answer. Left message for him to call back.  

## 2020-11-28 NOTE — Telephone Encounter (Signed)
Notified pt Trelegy refill would be placed. Review that Anoro should no be used. Pt stated understanding. Nothing further needed at this time.

## 2020-11-28 NOTE — Telephone Encounter (Signed)
Called the number on the package to check on the status of the application but no one answered. Did not feel comfortable leaving a message on the VM. Will attempt to call back later today to check on status of application.

## 2020-11-28 NOTE — Telephone Encounter (Signed)
There is often a lot of confusion regarding his medications. Please discontinue Anoro and send in Rx for Trelegy 132mcg one puff daily. Have him bring all medications into his visit with Dr. Lamonte Sakai in December (please write in apt note)

## 2020-11-29 ENCOUNTER — Ambulatory Visit (INDEPENDENT_AMBULATORY_CARE_PROVIDER_SITE_OTHER): Payer: Medicare Other | Admitting: Student

## 2020-11-29 ENCOUNTER — Other Ambulatory Visit: Payer: Self-pay

## 2020-11-29 ENCOUNTER — Encounter: Payer: Self-pay | Admitting: Student

## 2020-11-29 VITALS — BP 155/86 | HR 59 | Temp 98.0°F | Ht 69.0 in | Wt 171.0 lb

## 2020-11-29 DIAGNOSIS — M7989 Other specified soft tissue disorders: Secondary | ICD-10-CM

## 2020-11-29 DIAGNOSIS — Z114 Encounter for screening for human immunodeficiency virus [HIV]: Secondary | ICD-10-CM | POA: Diagnosis not present

## 2020-11-29 DIAGNOSIS — J432 Centrilobular emphysema: Secondary | ICD-10-CM

## 2020-11-29 DIAGNOSIS — R0609 Other forms of dyspnea: Secondary | ICD-10-CM | POA: Diagnosis not present

## 2020-11-29 DIAGNOSIS — Z1159 Encounter for screening for other viral diseases: Secondary | ICD-10-CM | POA: Diagnosis not present

## 2020-11-29 DIAGNOSIS — G894 Chronic pain syndrome: Secondary | ICD-10-CM

## 2020-11-29 DIAGNOSIS — I10 Essential (primary) hypertension: Secondary | ICD-10-CM

## 2020-11-29 DIAGNOSIS — M4712 Other spondylosis with myelopathy, cervical region: Secondary | ICD-10-CM

## 2020-11-29 DIAGNOSIS — R600 Localized edema: Secondary | ICD-10-CM

## 2020-11-29 LAB — BRAIN NATRIURETIC PEPTIDE: B Natriuretic Peptide: 118.9 pg/mL — ABNORMAL HIGH (ref 0.0–100.0)

## 2020-11-29 NOTE — Patient Instructions (Addendum)
Thank you, [name] for allowing [place] to provide your care today. Today we discussed his shortness of breath, leg swelling, throat problems and back problems.  Continue to follow-up with your pulmonologist, orthopedic/sports medicine doctor and ENT.  We evaluated you with labs and ultrasound to look for possible heart failure  We are also working on getting you at home nurse aide and physical therapy to help improve your mobility and assist with your activities of daily living.  I have ordered the following labs for you:  Lab Orders         CMP14 + Anion Gap         CBC no Diff         Brain natriuretic peptide         Hepatitis C antibody         HIV antibody (with reflex)         TSH         I will call if any are abnormal. All of your labs can be accessed through "My Chart".   I have ordered the following tests: Echocardiogram   My Chart Access: [email or web address]  Please follow-up in 2 weeks   Please make sure to arrive 15 minutes prior to your next appointment. If you arrive late, you may be asked to reschedule.    We look forward to seeing you next time. Please call our clinic at [phone] if you have any questions or concerns. The best time to call is Monday-Friday from 9am-4pm, but there is someone available 24/7. If after hours or the weekend, call the main hospital number and ask for the Internal Medicine Resident On-Call. If you need medication refills, please notify your pharmacy one week in advance and they will send [place] a request.   Thank you for letting us take part in your care. Wishing you the best!  Lacinda Axon, MD 11/29/2020, 2:53 PM IM Resident, PGY-2 [facility] 41:10
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
## 2020-11-30 LAB — CMP14 + ANION GAP
ALT: 15 IU/L (ref 0–44)
AST: 19 IU/L (ref 0–40)
Albumin/Globulin Ratio: 1.8 (ref 1.2–2.2)
Albumin: 4.1 g/dL (ref 3.8–4.8)
Alkaline Phosphatase: 118 IU/L (ref 44–121)
Anion Gap: 15 mmol/L (ref 10.0–18.0)
BUN/Creatinine Ratio: 6 — ABNORMAL LOW (ref 10–24)
BUN: 6 mg/dL — ABNORMAL LOW (ref 8–27)
Bilirubin Total: 0.3 mg/dL (ref 0.0–1.2)
CO2: 23 mmol/L (ref 20–29)
Calcium: 9.8 mg/dL (ref 8.6–10.2)
Chloride: 104 mmol/L (ref 96–106)
Creatinine, Ser: 1 mg/dL (ref 0.76–1.27)
Globulin, Total: 2.3 g/dL (ref 1.5–4.5)
Glucose: 98 mg/dL (ref 70–99)
Potassium: 4.9 mmol/L (ref 3.5–5.2)
Sodium: 142 mmol/L (ref 134–144)
Total Protein: 6.4 g/dL (ref 6.0–8.5)
eGFR: 84 mL/min/{1.73_m2} (ref >59)

## 2020-11-30 LAB — CBC
Hematocrit: 39.5 % (ref 37.5–51.0)
Hemoglobin: 13.8 g/dL (ref 13.0–17.7)
MCH: 31.1 pg (ref 26.6–33.0)
MCHC: 34.9 g/dL (ref 31.5–35.7)
MCV: 89 fL (ref 79–97)
Platelets: 268 10*3/uL (ref 150–450)
RBC: 4.44 x10E6/uL (ref 4.14–5.80)
RDW: 13.9 % (ref 11.6–15.4)
WBC: 8.5 10*3/uL (ref 3.4–10.8)

## 2020-11-30 LAB — HIV ANTIBODY (ROUTINE TESTING W REFLEX): HIV Screen 4th Generation wRfx: NONREACTIVE

## 2020-11-30 LAB — TSH: TSH: 0.578 u[IU]/mL (ref 0.450–4.500)

## 2020-11-30 LAB — HEPATITIS C ANTIBODY: Hep C Virus Ab: <0.1 s/co ratio (ref 0.0–0.9)

## 2020-12-02 ENCOUNTER — Encounter: Payer: Self-pay | Admitting: Student

## 2020-12-02 DIAGNOSIS — R600 Localized edema: Secondary | ICD-10-CM | POA: Insufficient documentation

## 2020-12-02 DIAGNOSIS — R0609 Other forms of dyspnea: Secondary | ICD-10-CM | POA: Insufficient documentation

## 2020-12-02 DIAGNOSIS — I10 Essential (primary) hypertension: Secondary | ICD-10-CM | POA: Insufficient documentation

## 2020-12-02 HISTORY — DX: Localized edema: R60.0

## 2020-12-02 NOTE — Assessment & Plan Note (Signed)
Patient relatively new to the practice. BP elevated in clinic today to systolic in the 150C.  During patient's first visit his BP was systolic in the 136C. Elevated BP could be due to his worsening back and neck pain. BMP shows normal kidney function. -- BP check at next office visit, start pt on antihypertensive medication if BP still elevated.  Consider starting a diuretic in the setting of patient's lower extremity edema.

## 2020-12-02 NOTE — Assessment & Plan Note (Signed)
Patient has had acute on chronic dyspnea on exertion in the past week. This has been limiting his functional status and ability to do his ADLs.  Patient initially on O2 at night but now requiring a portable O2 to use during the day in order to do his own groceries and errands.  Patient will benefit from home health PT/OT/nursing aide to help improve strength, endurance and functional status.   Plan: --Continue supplemental O2 --Continue daily Trelegy and as needed albuterol --Continue prednisone 10 mg daily --Home health PT/OT/nursing aide --Pulmonology appointment on 01/15/2021

## 2020-12-02 NOTE — Assessment & Plan Note (Signed)
65 year old with a history of ACDF C3-C6 (11/2015) and sacroiliac joint dysfunction who follows [facility] pain management and [facility] here with a complaint of chronic neck and back pain. Patient has had multiple epidural steroids injections in his C-spine and L-spine that initially relieved his pain but most recent ones have not been controlling his pain. He was started on oral steroids in April 2022 and states this has helped his pain. He had a recent x-ray of the cervical spine that showed solid fusion at C3-C7 and advanced degenerative disc disease C2-C3 and C7-T1 but no fractures. Patient states his back and neck pain have  been limiting his functional status to the point where he has difficulty flexing his neck down to cook. He has had difficulty doing groceries due to the pain and shortness of breath so often relies on TV dinners. He is currently taking Lyrica, Mobic and Robaxin with minimal pain relief.  Due to patient's limitations and decreased functional status from his back pain and chronic hypoxic respiratory respiratory failure on 2 L of O2, he will benefit greatly from home PT/OT/nurse aide.   Plan: -- Home PT/OT/nursing -- Continue Lyrica, Mobic and Robaxin -- Continue prednisone 10 mg daily -- Follow-up with [facility] pain management on 11/11

## 2020-12-02 NOTE — Assessment & Plan Note (Signed)
Patient continues to have neck and back pain limiting his functional status. He has an appointment with [facility] at [facility] on November 11th.  -- Advised to follow-up with them for possible ESI consider other treatment modalities.

## 2020-12-02 NOTE — Assessment & Plan Note (Signed)
Patient presents today for evaluation of leg swelling. Patient reports that over the last week, he has noticed increased swelling in his legs. He has also had increased dyspnea with exertion to the point where he has to stop multiple times to rest with just minimal exertion such as doing grocery shopping.  He has baseline shortness of breath on 2 L of portable O2 during the day and at bedtime but denies any PND, chest pain, palpitations, dizziness or orthopnea. Patient does reveal that because of his neck and back pain, he is unable stand for long and cook. Because of this he has been eating TV dinners a lot more this past week.   A/P 65 year old male with a history of COPD and chronic hypoxic respiratory failure on 2 L O2 here for evaluation of lower extremity edema. On exam, patient has no JVD but does have distant crackles at the lung bases and 1+ bilateral lower extremity pitting edema to the mid-calf.  Differential for patient's lower extremity edema includes heart failure, steroid use, hypothyroidism, venous insufficiency and dietary indiscretion. Patient had a normal echo in 11/2018. BMP mildly elevated to 118 today. TSH within normal limits. CBC and CMP unremarkable. Patient has been on steroids since April with no significant change in the recent week.  Patient's lower extremity edema likely secondary to increased salt intake in the setting of patient eating exclusively TV dinners. His prednisone use likely a contributing factor to the lower extremity edema as well.  -- Advised to decrease salt intake and wear compression stocking during the day -- Repeat echo -- Instructed to discuss steroid use with pain management -- Home health nursing aide to assist with ADLs

## 2020-12-03 NOTE — Progress Notes (Signed)
Internal Medicine Clinic Attending  Case discussed with Dr. [place]  At the time of the visit.  We reviewed the resident's history and exam and pertinent patient test results.  I agree with the assessment, diagnosis, and plan of care documented in the resident's note.  

## 2020-12-04 ENCOUNTER — Ambulatory Visit
Admission: RE | Admit: 2020-12-04 | Discharge: 2020-12-04 | Disposition: A | Discharge status: Home / Self Care | Payer: Medicare Other | Source: Ambulatory Visit | Attending: Student in an Organized Health Care Education/Training Program | Admitting: Student in an Organized Health Care Education/Training Program

## 2020-12-04 DIAGNOSIS — R221 Localized swelling, mass and lump, neck: Secondary | ICD-10-CM

## 2020-12-13 ENCOUNTER — Ambulatory Visit (INDEPENDENT_AMBULATORY_CARE_PROVIDER_SITE_OTHER): Payer: Medicare Other | Admitting: Student

## 2020-12-13 ENCOUNTER — Other Ambulatory Visit: Payer: Self-pay

## 2020-12-13 ENCOUNTER — Encounter: Payer: Self-pay | Admitting: Student

## 2020-12-13 VITALS — BP 123/84 | HR 75 | Temp 98.1°F | Resp 24 | Ht 69.0 in | Wt 168.7 lb

## 2020-12-13 DIAGNOSIS — J309 Allergic rhinitis, unspecified: Secondary | ICD-10-CM

## 2020-12-13 DIAGNOSIS — I10 Essential (primary) hypertension: Secondary | ICD-10-CM | POA: Diagnosis not present

## 2020-12-13 DIAGNOSIS — R194 Change in bowel habit: Secondary | ICD-10-CM | POA: Diagnosis not present

## 2020-12-13 DIAGNOSIS — I700 Atherosclerosis of aorta: Secondary | ICD-10-CM

## 2020-12-13 MED ORDER — FEXOFENADINE HCL 180 MG PO TABS: 180.0000 mg | ORAL_TABLET | Freq: Every day | ORAL | 0 refills | Status: DC

## 2020-12-13 NOTE — Assessment & Plan Note (Signed)
Patient reports increase in mucous, watery eyes, and post-nasal drip. He has been taking Zyrtec for "awhile" but does not feel like his symptoms have been controlled well. He does note the Flonase helps prevent nasal congestion.   Today we will switch to Allegra from Zyrtec. I will prescribe 30d initially.  - Stop Zyrtec, start Allegra 180mg  daily
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
## 2020-12-13 NOTE — Assessment & Plan Note (Signed)
This is a stable and chronic issue. We will obtain records from previous PCP and continue to manage medically and monitor.

## 2020-12-13 NOTE — Assessment & Plan Note (Signed)
Patient is presenting today to discuss one month history of frequent bowel movements. He reports 7-8 total BM daily over this time. Expresses the feeling that he has an incomplete bowel movement every time he goes. Denies any bright red blood in the stool. He does mention darker black stools when he was taking Pepto-Bismol, but since stopping the medication the color has returned to normal. He states he has had a normal appetite and denies any recent diet or lifestyle changes.   Patient does have a history of lung cancer and earlier this year was found to have new pulmonary nodules. He does have a previous colonoscopy in 2020 in which multiple polyps were removed. He also has a GI physician and will see them tomorrow. We will hold off any intervention at this time and defer to GI.  - Follow-up with GI tomorrow

## 2020-12-13 NOTE — Assessment & Plan Note (Signed)
BP Readings from Last 3 Encounters:  12/13/20 123/84  11/29/20 (!) 155/86  11/22/20 131/81   Blood pressure at goal today. Patient is currently not on any anti-hypertensives. Patient and I discussed lab work completed at the last visit, renal function at goal.  - Follow-up with PCP in three months

## 2020-12-13 NOTE — Patient Instructions (Signed)
Mr.Nicanor P Helvey, it was a pleasure seeing you today!  Today we discussed: - All of your labs from your previous visit look great! Nothing to follow-up on today.  - For your stomach issues, please follow-up with your gastroenterologist tomorrow.  - For your allergy symptoms, I would like to switch you from Zyrtec to [place].   I have ordered the following medication/changed the following medications:   Stop the following medications: Medications Discontinued During This Encounter  Medication Reason   cetirizine (ZYRTEC) 10 MG tablet      Start the following medications: Meds ordered this encounter  Medications   fexofenadine (ALLEGRA ALLERGY) 180 MG tablet    Sig: Take 1 tablet (180 mg total) by mouth daily.    Dispense:  30 tablet    Refill:  0     Follow-up: 3 months   Please make sure to arrive 15 minutes prior to your next appointment. If you arrive late, you may be asked to reschedule.   We look forward to seeing you next time. Please call our clinic at [phone] if you have any questions or concerns. The best time to call is Monday-Friday from 9am-4pm, but there is someone available 24/7. If after hours or the weekend, call the main hospital number and ask for the Internal Medicine Resident On-Call. If you need medication refills, please notify your pharmacy one week in advance and they will send [place] a request.  Thank you for letting us take part in your care. Wishing you the best!  Thank you, Sanjuan Dame, MD

## 2020-12-14 ENCOUNTER — Other Ambulatory Visit (INDEPENDENT_AMBULATORY_CARE_PROVIDER_SITE_OTHER): Payer: Medicare Other

## 2020-12-14 ENCOUNTER — Encounter: Payer: Self-pay | Admitting: Gastroenterology

## 2020-12-14 ENCOUNTER — Ambulatory Visit (INDEPENDENT_AMBULATORY_CARE_PROVIDER_SITE_OTHER): Payer: Medicare Other | Admitting: Gastroenterology

## 2020-12-14 VITALS — BP 110/76 | HR 74 | Ht 69.0 in | Wt 170.0 lb

## 2020-12-14 DIAGNOSIS — R1310 Dysphagia, unspecified: Secondary | ICD-10-CM | POA: Diagnosis not present

## 2020-12-14 DIAGNOSIS — R0989 Other specified symptoms and signs involving the circulatory and respiratory systems: Secondary | ICD-10-CM | POA: Diagnosis not present

## 2020-12-14 DIAGNOSIS — K580 Irritable bowel syndrome with diarrhea: Secondary | ICD-10-CM | POA: Insufficient documentation

## 2020-12-14 DIAGNOSIS — K529 Noninfective gastroenteritis and colitis, unspecified: Secondary | ICD-10-CM

## 2020-12-14 DIAGNOSIS — R1084 Generalized abdominal pain: Secondary | ICD-10-CM

## 2020-12-14 DIAGNOSIS — R194 Change in bowel habit: Secondary | ICD-10-CM | POA: Insufficient documentation

## 2020-12-14 DIAGNOSIS — R109 Unspecified abdominal pain: Secondary | ICD-10-CM

## 2020-12-14 DIAGNOSIS — R09A2 Foreign body sensation, throat: Secondary | ICD-10-CM | POA: Insufficient documentation

## 2020-12-14 LAB — C-REACTIVE PROTEIN: CRP: 2.7 mg/dL (ref 0.5–20.0)

## 2020-12-14 LAB — CBC
HCT: 41 % (ref 39.0–52.0)
Hemoglobin: 13.8 g/dL (ref 13.0–17.0)
MCHC: 33.7 g/dL (ref 30.0–36.0)
MCV: 92.8 fl (ref 78.0–100.0)
Platelets: 249 10*3/uL (ref 150.0–400.0)
RBC: 4.42 Mil/uL (ref 4.22–5.81)
RDW: 16.1 % — ABNORMAL HIGH (ref 11.5–15.5)
WBC: 7.4 10*3/uL (ref 4.0–10.5)

## 2020-12-14 LAB — COMPREHENSIVE METABOLIC PANEL
ALT: 17 U/L (ref 0–53)
AST: 18 U/L (ref 0–37)
Albumin: 4.1 g/dL (ref 3.5–5.2)
Alkaline Phosphatase: 85 U/L (ref 39–117)
BUN: 9 mg/dL (ref 6–23)
CO2: 29 mEq/L (ref 19–32)
Calcium: 9.5 mg/dL (ref 8.4–10.5)
Chloride: 104 mEq/L (ref 96–112)
Creatinine, Ser: 1.36 mg/dL (ref 0.40–1.50)
GFR: 54.5 mL/min — ABNORMAL LOW (ref >60.00)
Glucose, Bld: 77 mg/dL (ref 70–99)
Potassium: 4.2 mEq/L (ref 3.5–5.1)
Sodium: 140 mEq/L (ref 135–145)
Total Bilirubin: 0.6 mg/dL (ref 0.2–1.2)
Total Protein: 6.9 g/dL (ref 6.0–8.3)

## 2020-12-14 LAB — SEDIMENTATION RATE: Sed Rate: 10 mm/hr (ref 0–20)

## 2020-12-14 MED ORDER — DICYCLOMINE HCL 10 MG PO CAPS: 10.0000 mg | ORAL_CAPSULE | Freq: Three times a day (TID) | ORAL | 1 refills | Status: DC

## 2020-12-14 NOTE — Patient Instructions (Signed)
We have sent the following medications to your pharmacy for you to pick up at your convenience: Bentyl   Start Benefiber  once daily for 1 week, then increase to twice daily to help bulk bowel movements.   Please let us know in 3 weeks how your doing. If not feeling any better Dr. Rush Landmark will consider a medication called Xifaxin.   Your provider has requested that you go to the basement level for lab work before leaving today. Press "B" on the elevator. The lab is located at the first door on the left as you exit the elevator.  If you are age 25 or older, your body mass index should be between 23-30. Your Body mass index is 25.1 kg/m. If this is out of the aforementioned range listed, please consider follow up with your Primary Care Provider.   The [facility] providers would like to encourage you to use [facility] to communicate with providers for non-urgent requests or questions.  Due to long hold times on the telephone, sending your provider a message by [facility] may be a faster and more efficient way to get a response.  Please allow 48 business hours for a response.  Please remember that this is for non-urgent requests.  _______________________________________________________   Thank you for choosing me and [facility].  Dr. Rush Landmark
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
## 2020-12-15 ENCOUNTER — Encounter: Payer: Self-pay | Admitting: Gastroenterology

## 2020-12-15 DIAGNOSIS — K529 Noninfective gastroenteritis and colitis, unspecified: Secondary | ICD-10-CM | POA: Insufficient documentation

## 2020-12-17 ENCOUNTER — Other Ambulatory Visit: Payer: Medicare Other

## 2020-12-17 DIAGNOSIS — R109 Unspecified abdominal pain: Secondary | ICD-10-CM

## 2020-12-17 DIAGNOSIS — R194 Change in bowel habit: Secondary | ICD-10-CM

## 2020-12-17 DIAGNOSIS — R1084 Generalized abdominal pain: Secondary | ICD-10-CM

## 2020-12-17 DIAGNOSIS — R0989 Other specified symptoms and signs involving the circulatory and respiratory systems: Secondary | ICD-10-CM

## 2020-12-17 DIAGNOSIS — K529 Noninfective gastroenteritis and colitis, unspecified: Secondary | ICD-10-CM

## 2020-12-17 DIAGNOSIS — R1310 Dysphagia, unspecified: Secondary | ICD-10-CM

## 2020-12-18 NOTE — Progress Notes (Signed)
Internal Medicine Clinic Attending ? ?Case discussed with Dr. Braswell  At the time of the visit.  We reviewed the resident?s history and exam and pertinent patient test results.  I agree with the assessment, diagnosis, and plan of care documented in the resident?s note.  ?

## 2020-12-19 ENCOUNTER — Other Ambulatory Visit: Payer: Self-pay | Admitting: Gastroenterology

## 2020-12-20 ENCOUNTER — Telehealth: Payer: Self-pay

## 2020-12-20 ENCOUNTER — Other Ambulatory Visit: Payer: Self-pay

## 2020-12-20 ENCOUNTER — Telehealth: Payer: Self-pay | Admitting: Gastroenterology

## 2020-12-20 MED ORDER — DICYCLOMINE HCL 10 MG PO CAPS: 10.0000 mg | ORAL_CAPSULE | Freq: Three times a day (TID) | ORAL | 1 refills | Status: DC

## 2020-12-20 NOTE — Telephone Encounter (Signed)
Called patient and gave results of C-Diff stool test and labs. He states he has not been able to start on the Bentyl because [facility] only has the 20 mg dose. I called the [facility] near him and they have the 10 mg dose. I let the patient know and sent the order for Bentyl-10 mg 1 capsule 4 times a day, before meals and at bedtime to them

## 2020-12-20 NOTE — Telephone Encounter (Signed)
Inbound call from patient states he is having frequent bowel movements. States they are not always loose but it is happening more than normal and it is effecting his breathing while on oxygen. He is also experiencing pain in stomach  He also have questions about his lab work results and stool sample.

## 2020-12-20 NOTE — Telephone Encounter (Signed)
Received TC from patient who states he has diarrhea and that he provided a stool sample to GI, but hasn't heard back regarding the results. RN instructed patient to call his GI office for results and to have them advise on his GI complaints, pt was seen at [facility] on 11/11 and chronic diarrhea was addressed.   Pt requesting an appt with PCP to discuss SOB, he does not c/o SOB at this time, but states it is worsening.  Patient sees pulmonology but states he wants to see someone at [facility] next week.  Appt made for Monday at 1415, red team/Dr. Collene Gobble.  Pt was instructed to go to the ED for SOB at rest or worsening/continuing SOB w/ exertion and he verbalized understanding. SChaplin, RN,BSN

## 2020-12-24 ENCOUNTER — Ambulatory Visit (INDEPENDENT_AMBULATORY_CARE_PROVIDER_SITE_OTHER): Payer: Medicare Other | Admitting: Student

## 2020-12-24 ENCOUNTER — Encounter: Payer: Self-pay | Admitting: Student

## 2020-12-24 ENCOUNTER — Other Ambulatory Visit: Payer: Self-pay

## 2020-12-24 ENCOUNTER — Ambulatory Visit (HOSPITAL_COMMUNITY)
Admission: RE | Admit: 2020-12-24 | Discharge: 2020-12-24 | Disposition: A | Discharge status: Home / Self Care | Payer: Medicare Other | Source: Ambulatory Visit | Attending: Internal Medicine | Admitting: Internal Medicine

## 2020-12-24 VITALS — BP 105/82 | HR 87 | Temp 98.1°F | Resp 24 | Ht 69.0 in | Wt 165.5 lb

## 2020-12-24 DIAGNOSIS — R0609 Other forms of dyspnea: Secondary | ICD-10-CM

## 2020-12-24 DIAGNOSIS — R0989 Other specified symptoms and signs involving the circulatory and respiratory systems: Secondary | ICD-10-CM

## 2020-12-24 DIAGNOSIS — R194 Change in bowel habit: Secondary | ICD-10-CM

## 2020-12-24 NOTE — Patient Instructions (Signed)
[name], it was a pleasure seeing you today!  Today we discussed: - Throat closing: Please call the ENT (Dr. [name]) for an appointment. The number is [phone].  - Bowel movements: You can increase benefiber to twice a day. I will message Dr. Rush Landmark, as you might need a repeat colonoscopy.  - Worsening breathing: Please make sure to follow-up with your scheduled Echo (ultrasound of your heart). Today we will get a chest x-ray. In addition, please make sure you're taking your Trelegy and prednisone daily.  Upcoming appointments: 12/26/2020: Pain Clinic @ 1:40PM 01/10/2021: Orthopedics, spine center @ 2:20PM 01/11/2021: Dermatology @ 2PM 01/15/2021: Pulmonology @ 315 01/18/2021: Echo (ultrasound of your heart) @ 3PM  Follow-up: 3 months   Please make sure to arrive 15 minutes prior to your next appointment. If you arrive late, you may be asked to reschedule.   We look forward to seeing you next time. Please call our clinic at [phone] if you have any questions or concerns. The best time to call is Monday-Friday from 9am-4pm, but there is someone available 24/7. If after hours or the weekend, call the main hospital number and ask for the Internal Medicine Resident On-Call. If you need medication refills, please notify your pharmacy one week in advance and they will send [place] a request.  Thank you for letting us take part in your care. Wishing you the best!  Thank you, Sanjuan Dame, MD

## 2020-12-25 NOTE — Telephone Encounter (Signed)
Called and spoke with pt to see if everything was straightened out about the SCAT application. Pt said he spoke with the person and they said that after he spoke with the lady, they told him that more info might still need to be stated since he is wearing O2. Pt said he thought that they were going to be contacting us but I stated to pt that we had not heard anything and he stated that he believes that all might be okay. Stated to pt if anything was needed prior to upcoming OV to call us and he verbalized understanding. Nothing further needed.

## 2020-12-27 NOTE — Assessment & Plan Note (Signed)
Patient mentions that his symptoms of frequent bowel movements have continued since his last visit. Patient followed up with gastroenterology the day after the visit, who prescribed daily Benefiber. [name] reports compliance with this medication, although his symptoms have not changed. He continues to have some bright red blood in stool and also mentions melena. He denies abdominal pain but does mention intermittent cramps for which he takes Bentyl.   Per chart review, patient has history of chronic colitis found on colonoscopy in 2020. I will discuss with Dr. Rush Landmark for possible repeat colonoscopy for further evaluation. In the meantime we will increase Benefiber to twice daily.   - Increase Benefiber twice daily - Follow-up with GI

## 2020-12-27 NOTE — Assessment & Plan Note (Signed)
Patient reports gradual worsening of respiratory symptoms. He mentions feeling more out of breath than he previously did. Over the last few months reports increasing dyspnea on exertion in addition to dyspnea at rest. He denies orthopnea or paroxysmal nocturnal dyspnea. Patient is compliant with daily Trelegy.  On physical exam, mild rales appreciated in right lower lung field. Patient already has Echo scheduled from previous visit in October for similar complaint. Today we will obtain CXR and have him follow-up with the Echo next month.   - CXR today - Continue daily Trelegy - Follow-up Echocardiogram - Patient to follow-up with pulmonology next month
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
## 2020-12-27 NOTE — Assessment & Plan Note (Signed)
Patient continues to endorse globus sensation. He states that on a daily basis he feels like he is constantly having to clear his throat. Further describes feeling like something is stuck in the back of his throat and sometimes makes it difficult to swallow. Patient recently saw GI, who has referred [name] to ENT.  I have given [name] the information to the office for ENT referral. He will follow-up with that office. If no intervention possible by ENT, can consider EGD for further evaluation.  - Follow-up with ENT

## 2020-12-28 LAB — CLOSTRIDIUM DIFFICILE TOXIN B, QUALITATIVE, REAL-TIME PCR: Toxigenic C. Difficile by PCR: NOT DETECTED

## 2020-12-28 LAB — PANCREATIC ELASTASE, FECAL: Pancreatic Elastase-1, Stool: >500 mcg/g

## 2021-01-01 NOTE — Progress Notes (Signed)
Internal Medicine Clinic Attending ? ?Case discussed with Dr. Braswell  At the time of the visit.  We reviewed the resident?s history and exam and pertinent patient test results.  I agree with the assessment, diagnosis, and plan of care documented in the resident?s note.  ?

## 2021-01-15 ENCOUNTER — Encounter (HOSPITAL_COMMUNITY): Payer: Self-pay

## 2021-01-15 ENCOUNTER — Ambulatory Visit (INDEPENDENT_AMBULATORY_CARE_PROVIDER_SITE_OTHER): Payer: Medicare Other | Admitting: Emergency Medicine

## 2021-01-15 ENCOUNTER — Emergency Department (HOSPITAL_COMMUNITY)
Admission: EM | Admit: 2021-01-15 | Discharge: 2021-01-16 | Disposition: A | Discharge status: Home / Self Care | Payer: Medicare Other | Attending: Emergency Medicine | Admitting: Emergency Medicine

## 2021-01-15 ENCOUNTER — Emergency Department (HOSPITAL_COMMUNITY): Payer: Medicare Other

## 2021-01-15 ENCOUNTER — Encounter: Payer: Self-pay | Admitting: Emergency Medicine

## 2021-01-15 ENCOUNTER — Other Ambulatory Visit: Payer: Self-pay

## 2021-01-15 DIAGNOSIS — M5440 Lumbago with sciatica, unspecified side: Secondary | ICD-10-CM

## 2021-01-15 DIAGNOSIS — J301 Allergic rhinitis due to pollen: Secondary | ICD-10-CM

## 2021-01-15 DIAGNOSIS — C3490 Malignant neoplasm of unspecified part of unspecified bronchus or lung: Secondary | ICD-10-CM

## 2021-01-15 DIAGNOSIS — I2510 Atherosclerotic heart disease of native coronary artery without angina pectoris: Secondary | ICD-10-CM | POA: Insufficient documentation

## 2021-01-15 DIAGNOSIS — Z85118 Personal history of other malignant neoplasm of bronchus and lung: Secondary | ICD-10-CM | POA: Diagnosis not present

## 2021-01-15 DIAGNOSIS — Z79899 Other long term (current) drug therapy: Secondary | ICD-10-CM | POA: Diagnosis not present

## 2021-01-15 DIAGNOSIS — J432 Centrilobular emphysema: Secondary | ICD-10-CM | POA: Diagnosis not present

## 2021-01-15 DIAGNOSIS — M5441 Lumbago with sciatica, right side: Secondary | ICD-10-CM | POA: Insufficient documentation

## 2021-01-15 DIAGNOSIS — Z7952 Long term (current) use of systemic steroids: Secondary | ICD-10-CM | POA: Insufficient documentation

## 2021-01-15 DIAGNOSIS — K219 Gastro-esophageal reflux disease without esophagitis: Secondary | ICD-10-CM | POA: Diagnosis not present

## 2021-01-15 DIAGNOSIS — J449 Chronic obstructive pulmonary disease, unspecified: Secondary | ICD-10-CM | POA: Insufficient documentation

## 2021-01-15 DIAGNOSIS — R0789 Other chest pain: Secondary | ICD-10-CM

## 2021-01-15 DIAGNOSIS — Z87891 Personal history of nicotine dependence: Secondary | ICD-10-CM | POA: Diagnosis not present

## 2021-01-15 DIAGNOSIS — I10 Essential (primary) hypertension: Secondary | ICD-10-CM | POA: Diagnosis not present

## 2021-01-15 DIAGNOSIS — M545 Low back pain, unspecified: Secondary | ICD-10-CM | POA: Diagnosis present

## 2021-01-15 LAB — URINALYSIS, ROUTINE W REFLEX MICROSCOPIC
Bilirubin Urine: NEGATIVE
Glucose, UA: NEGATIVE mg/dL
Hgb urine dipstick: NEGATIVE
Ketones, ur: 15 mg/dL — AB
Leukocytes,Ua: NEGATIVE
Nitrite: NEGATIVE
Protein, ur: NEGATIVE mg/dL
Specific Gravity, Urine: 1.015 (ref 1.005–1.030)
pH: 7.5 (ref 5.0–8.0)

## 2021-01-15 LAB — COMPREHENSIVE METABOLIC PANEL
ALT: 13 U/L (ref 0–44)
AST: 16 U/L (ref 15–41)
Albumin: 3.5 g/dL (ref 3.5–5.0)
Alkaline Phosphatase: 83 U/L (ref 38–126)
Anion gap: 7 (ref 5–15)
BUN: 13 mg/dL (ref 8–23)
CO2: 23 mmol/L (ref 22–32)
Calcium: 8.9 mg/dL (ref 8.9–10.3)
Chloride: 105 mmol/L (ref 98–111)
Creatinine, Ser: 1.06 mg/dL (ref 0.61–1.24)
GFR, Estimated: >60 mL/min (ref >60)
Glucose, Bld: 101 mg/dL — ABNORMAL HIGH (ref 70–99)
Potassium: 3.5 mmol/L (ref 3.5–5.1)
Sodium: 135 mmol/L (ref 135–145)
Total Bilirubin: 1.4 mg/dL — ABNORMAL HIGH (ref 0.3–1.2)
Total Protein: 7 g/dL (ref 6.5–8.1)

## 2021-01-15 LAB — CBC
HCT: 42.1 % (ref 39.0–52.0)
Hemoglobin: 13.7 g/dL (ref 13.0–17.0)
MCH: 31.4 pg (ref 26.0–34.0)
MCHC: 32.5 g/dL (ref 30.0–36.0)
MCV: 96.6 fL (ref 80.0–100.0)
Platelets: 339 10*3/uL (ref 150–400)
RBC: 4.36 MIL/uL (ref 4.22–5.81)
RDW: 14.3 % (ref 11.5–15.5)
WBC: 14.6 10*3/uL — ABNORMAL HIGH (ref 4.0–10.5)
nRBC: 0 % (ref 0.0–0.2)

## 2021-01-15 LAB — TROPONIN I (HIGH SENSITIVITY): Troponin I (High Sensitivity): 8 ng/L (ref <18)

## 2021-01-15 LAB — LIPASE, BLOOD: Lipase: 28 U/L (ref 11–51)

## 2021-01-15 MED ORDER — ALBUTEROL SULFATE HFA 108 (90 BASE) MCG/ACT IN AERS: INHALATION_SPRAY | RESPIRATORY_TRACT | 3 refills | Status: DC

## 2021-01-15 MED ORDER — DOXYCYCLINE HYCLATE 100 MG PO TABS: ORAL_TABLET | ORAL | 3 refills | Status: DC

## 2021-01-15 MED ORDER — IBUPROFEN 400 MG PO TABS
600.0000 mg | ORAL_TABLET | Freq: Once | ORAL | Status: AC
  Administered 2021-01-15: 600 mg via ORAL
  Filled 2021-01-15: qty 1

## 2021-01-15 MED ORDER — ONDANSETRON 4 MG PO TBDP
4.0000 mg | ORAL_TABLET | Freq: Once | ORAL | Status: AC
  Administered 2021-01-15: 4 mg via ORAL
  Filled 2021-01-15: qty 1

## 2021-01-15 MED ORDER — TRELEGY ELLIPTA 100-62.5-25 MCG/ACT IN AEPB
1.0000 | INHALATION_SPRAY | Freq: Every day | RESPIRATORY_TRACT | 1 refills | Status: DC
Start: 2021-01-15 — End: 2021-06-04

## 2021-01-15 MED ORDER — PREDNISONE 10 MG PO TABS: 10.0000 mg | ORAL_TABLET | Freq: Every day | ORAL | 3 refills | Status: DC

## 2021-01-15 MED ORDER — CEFUROXIME AXETIL 250 MG PO TABS: ORAL_TABLET | ORAL | 3 refills | Status: DC

## 2021-01-15 MED ORDER — AZITHROMYCIN 250 MG PO TABS: ORAL_TABLET | ORAL | 3 refills | Status: DC

## 2021-01-15 NOTE — Patient Instructions (Addendum)
We will repeat your CT scan of the chest in January 2023 to follow your pulmonary nodules. Please continue Trelegy 1 inhalation once daily.  Rinse and gargle after using. Keep your albuterol available to use 2 puffs when you needed for shortness of breath, chest tightness, wheezing. We will restart your prednisone 10 mg once daily.  Keep track of whether this helps your breathing so we can discuss it next time. We will continue your rotating antibiotics for the first week of each month: -Cefuroxime -Azithromycin -Doxycycline Continue oxygen 2 L/min at all times. Please continue your fluticasone nasal spray and Allegra as you have been using them. Please continue your omeprazole as you have been using it. Follow-up with [facility] pain clinic as planned. Follow with Dr. Lamonte Sakai in January after your CT scan so we can review the results.

## 2021-01-15 NOTE — Addendum Note (Signed)
Addended by: Gavin Potters R on: 01/15/2021 03:50 PM   Modules accepted: Orders

## 2021-01-15 NOTE — Assessment & Plan Note (Signed)
Please continue your omeprazole as you have been using it.

## 2021-01-15 NOTE — Progress Notes (Signed)
Subjective:    Patient ID: [name], male    DOB: [birth year], 65 y.o.   MRN: [id number]  COPD He complains of cough and shortness of breath. There is no wheezing. Pertinent negatives include no ear pain, fever, headaches, postnasal drip, rhinorrhea, sneezing, sore throat or trouble swallowing. His past medical history is significant for COPD.  HPI     ROV 09/17/20 --[name] is a 65 and has severe COPD with associated hypoxemic respiratory failure.  Also history of adenocarcinoma of the lung bilaterally postsurgical resections.  Also with GERD and allergic rhinitis. Has had multiple exacerbations, including in May 2022.  Started him on cyclical antibiotics, unclear that he was taking these reliably but now reports that he is.  He is on Anoro, felt that it helped him more than Trelegy.  Using albuterol approximately few times a week.  Omeprazole 40 mg daily  Reports today that for last 2 weeks he has had fatigue, poor appetite. No fevers. Some increase in his nasal congestion. More SOB when he is supine, feels that he has to sit up. O2 is set on 2L/min. Coughs daily, clear-to-green.   CT chest 08/22/2020 reviewed by me, shows evidence of prior left upper lobe and right upper lobe resections.  Small left lower lobe pulmonary nodule 7 x 3 mm of unclear significance (new), 12 x 6 mm right lower lobe nodule.   ROV 01/15/21 --65 year old man whom I have followed for severe COPD, adenocarcinoma of the lung with bilateral surgical resection, associated hypoxemic respiratory failure.  He also has GERD, allergic rhinitis.  He had a new left lower lobe pulmonary nodule on his most recent CT chest in July 2022, has been following stable right lower lobe nodule.  Due for recheck 02/2021. I had been managing him on Anoro.  He was changed back to Trelegy and there is been some confusion as to which when he was supposed to stay on.  He has been on rotating antibiotics (cefuroxime, azithro, doxy),  prednisone 10 mg daily (ran out of this about 2 weeks ago). On flonase, omeprazole, allegra. He is on 2L/min. Difficulty telling whether the trelegy is any better than anoro.  Today he reports that he has been dealing with acute on chronic back pain. He sees [facility], uses muscle relaxer, tramadol, gets injections. The pain is impacting functional capacity, also his breathing.                                                                                                                                            Review of Systems  Constitutional:  Negative for fever and unexpected weight change.  HENT:  Positive for congestion. Negative for dental problem, ear pain, nosebleeds, postnasal drip, rhinorrhea, sinus pressure, sneezing, sore throat and trouble swallowing.   Eyes:  Negative for redness and itching.  Respiratory:  Positive for  cough and shortness of breath. Negative for chest tightness and wheezing.   Cardiovascular:  Positive for palpitations. Negative for leg swelling.  Gastrointestinal:  Negative for nausea and vomiting.  Genitourinary:  Negative for dysuria.  Musculoskeletal:  Negative for joint swelling.  Skin:  Negative for rash.  Neurological:  Negative for headaches.  Hematological:  Does not bruise/bleed easily.  Psychiatric/Behavioral:  Positive for dysphoric mood. The patient is nervous/anxious.         Objective:   Physical Exam  Vitals:   01/15/21 1459  BP: 136/78  Pulse: 82  Temp: 99.2 F (37.3 C)  TempSrc: Oral  SpO2: 95%  Weight: 166 lb (75.3 kg)  Height: 5\' 9"  (1.753 m)   Gen: Pleasant, well-nourished, in no distress, depressed affect  ENT: No lesions,  mouth clear,  oropharynx clear, no postnasal drip  Neck: No JVD, no stridor  Lungs: No use of accessory muscles, clear without rales or rhonchi, no wheeze  Cardiovascular: RRR, heart sounds normal, no murmur or gallops, no peripheral edema  Musculoskeletal: no deformities   Neuro:  alert, non focal  Skin: Warm, no lesions or rashes     Assessment & Plan:  COPD with emphysema (HCC) Please continue Trelegy 1 inhalation once daily.  Rinse and gargle after using. Keep your albuterol available to use 2 puffs when you needed for shortness of breath, chest tightness, wheezing. We will restart your prednisone 10 mg once daily.  Keep track of whether this helps your breathing so we can discuss it next time. We will continue your rotating antibiotics for the first week of each month: -Cefuroxime -Azithromycin -Doxycycline   Allergic rhinitis Please continue your fluticasone nasal spray and Allegra as you have been using them.   Laryngopharyngeal reflux (LPR) Please continue your omeprazole as you have been using it.  Lung cancer [facility]) Post surgical resection.  Following 2 pulmonary nodules noted on CT chest 08/2020.  Plan for repeat scan in January 2023 and then follow-up after to review.   Baltazar Apo, MD, PhD 01/15/2021, 3:32 PM [facility] [phone] or if no answer [phone]

## 2021-01-15 NOTE — ED Provider Notes (Signed)
Emergency Medicine Provider Triage Evaluation Note  [name] , a 65 y.o. male  was evaluated in triage.  Pt complains of chest pain, low back pain, lower abdominal pain.  Patient does have history of chronic low back pain for which he recently received steroid injection.  He states the other day after leaving his [employer] meeting he walked home and cause a flareup of his back pain.  With his abdominal pain he denies nausea, vomiting, fever however he does report urinary retention over the past week.  He states he has been dealing with chest pain for over a year intermittently and his current episode started a couple days ago.  His chest pain is nonradiating.  Review of Systems  Positive: As above Negative: As above  Physical Exam  BP 127/86 (BP Location: Right Arm)    Pulse 86    Temp 98.5 F (36.9 C) (Oral)    Resp 17    Ht 5\' 9"  (1.753 m)    Wt 70.3 kg    SpO2 94%    BMI 22.89 kg/m  Gen:   Awake, no distress   Resp:  Normal effort  MSK:   Moves extremities without difficulty  Other:    Medical Decision Making  Medically screening exam initiated at 9:24 PM.  Appropriate orders placed.  [name] was informed that the remainder of the evaluation will be completed by another provider, this initial triage assessment does not replace that evaluation, and the importance of remaining in the ED until their evaluation is complete.     Evlyn Courier, PA-C 01/15/21 2132    Lacretia Leigh, MD 01/18/21 [phone]

## 2021-01-15 NOTE — ED Triage Notes (Signed)
Pt here for CP via [facility]. Patient stated that bilateral abdominal pain, lower, radiated to center chest. Pt also stating that he's been experiencing nausea as well. Per medic, 12 Lead EKG normal. No meds or IV PTA.  BP: 138/76, HR: 83, RR: 16, 96% (RA),  86 CBG.

## 2021-01-15 NOTE — Assessment & Plan Note (Signed)
>>  ASSESSMENT AND PLAN FOR LARYNGOPHARYNGEAL REFLUX (LPR) WRITTEN ON 01/15/2021  3:31 PM BY BYRUM, LAMAR RAMAN, MD  Please continue your omeprazole  as you have been using it.

## 2021-01-15 NOTE — Assessment & Plan Note (Signed)
Please continue Trelegy 1 inhalation once daily.  Rinse and gargle after using. Keep your albuterol available to use 2 puffs when you needed for shortness of breath, chest tightness, wheezing. We will restart your prednisone 10 mg once daily.  Keep track of whether this helps your breathing so we can discuss it next time. We will continue your rotating antibiotics for the first week of each month: -Cefuroxime -Azithromycin -Doxycycline

## 2021-01-15 NOTE — Assessment & Plan Note (Signed)
Please continue your fluticasone nasal spray and Allegra as you have been using them.

## 2021-01-15 NOTE — Assessment & Plan Note (Signed)
Post surgical resection.  Following 2 pulmonary nodules noted on CT chest 08/2020.  Plan for repeat scan in January 2023 and then follow-up after to review.

## 2021-01-16 ENCOUNTER — Telehealth: Payer: Self-pay | Admitting: *Deleted

## 2021-01-16 ENCOUNTER — Encounter: Payer: Medicare Other | Admitting: Internal Medicine

## 2021-01-16 DIAGNOSIS — M5441 Lumbago with sciatica, right side: Secondary | ICD-10-CM | POA: Diagnosis not present

## 2021-01-16 LAB — TROPONIN I (HIGH SENSITIVITY): Troponin I (High Sensitivity): 7 ng/L (ref <18)

## 2021-01-16 MED ORDER — ACETAMINOPHEN 325 MG PO TABS
650.0000 mg | ORAL_TABLET | Freq: Once | ORAL | Status: AC
  Administered 2021-01-16: 04:00:00 650 mg via ORAL
  Filled 2021-01-16: qty 2

## 2021-01-16 MED ORDER — METHYLPREDNISOLONE 4 MG PO TBPK: ORAL_TABLET | ORAL | 0 refills | Status: DC

## 2021-01-16 MED ORDER — SUCRALFATE 1 G PO TABS: 1.0000 g | ORAL_TABLET | Freq: Three times a day (TID) | ORAL | 0 refills | Status: DC

## 2021-01-16 MED ORDER — OXYCODONE HCL 5 MG PO TABS
5.0000 mg | ORAL_TABLET | Freq: Once | ORAL | Status: AC
  Administered 2021-01-16: 12:00:00 5 mg via ORAL
  Filled 2021-01-16: qty 1

## 2021-01-16 MED ORDER — HYDROXYZINE HCL 25 MG PO TABS
25.0000 mg | ORAL_TABLET | Freq: Once | ORAL | Status: AC
  Administered 2021-01-16: 14:00:00 25 mg via ORAL
  Filled 2021-01-16: qty 1

## 2021-01-16 MED ORDER — ONDANSETRON 4 MG PO TBDP
4.0000 mg | ORAL_TABLET | Freq: Once | ORAL | Status: AC
  Administered 2021-01-16: 04:00:00 4 mg via ORAL
  Filled 2021-01-16: qty 1

## 2021-01-16 MED ORDER — OXYCODONE HCL 5 MG PO TABS: 5.0000 mg | ORAL_TABLET | Freq: Four times a day (QID) | ORAL | 0 refills | Status: DC | PRN
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
## 2021-01-16 NOTE — Telephone Encounter (Signed)
SPOKE WITH PATIENT HE STATED HE HAD JUST GOTTEN OUT OF THE ED THIS EVENING. APPOINTMENT WAS RESCHEDULE  FOR 01-23-2021.

## 2021-01-18 ENCOUNTER — Ambulatory Visit (HOSPITAL_COMMUNITY): Admission: RE | Admit: 2021-01-18 | Payer: Medicare Other | Source: Ambulatory Visit

## 2021-01-23 ENCOUNTER — Encounter: Payer: Medicare Other | Admitting: Internal Medicine
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
## 2021-01-31 ENCOUNTER — Telehealth: Payer: Self-pay | Admitting: *Deleted

## 2021-01-31 NOTE — Telephone Encounter (Signed)
Call from pt who's currently at the pain management center on [address] for a f/u visit. Stated he has hip pain radiating down his leg. Also having right chest pain, which he stated he had before and usually take a Tylenol. He took Tylenol and the pain went away but the pain came back but on the left side. While talking to pt, he sounded like he's in extreme  pain. I told pt to call 911 now or have someone at the office to call. Stated he will cal 911.

## 2021-02-11 ENCOUNTER — Ambulatory Visit (HOSPITAL_COMMUNITY): Admission: RE | Admit: 2021-02-11 | Payer: Commercial Managed Care - HMO | Source: Ambulatory Visit

## 2021-02-20 ENCOUNTER — Other Ambulatory Visit: Payer: Self-pay | Admitting: Student

## 2021-02-20 DIAGNOSIS — R0609 Other forms of dyspnea: Secondary | ICD-10-CM

## 2021-02-20 DIAGNOSIS — M4712 Other spondylosis with myelopathy, cervical region: Secondary | ICD-10-CM

## 2021-02-20 DIAGNOSIS — J432 Centrilobular emphysema: Secondary | ICD-10-CM

## 2021-02-20 NOTE — Progress Notes (Signed)
Re-ordered his Ambulatory referral to [facility] since the last order is now out of the 30-day window per RN.

## 2021-02-22 ENCOUNTER — Telehealth: Payer: Self-pay | Admitting: Student

## 2021-02-22 NOTE — Telephone Encounter (Signed)
Rec'd a call from [facility] [phone].    Pt Requesting a delay in Service until Feb 24, 2021.   Please call back.

## 2021-02-22 NOTE — Telephone Encounter (Signed)
VO left on confidential VM of Donita, RN with [facility] to delay start of care to 1/22.

## 2021-02-25 ENCOUNTER — Telehealth: Payer: Self-pay | Admitting: *Deleted

## 2021-02-25 NOTE — Telephone Encounter (Signed)
CORRECTION / PCS FORM IN RED TEAM BOX.

## 2021-02-25 NOTE — Telephone Encounter (Signed)
RETURNED CALL TO PATIENT REGARDING PCS/ PATIENT STATES HE IS NEEDING SOMEONE TO COME AND COOK AND CLEAN HIS HOUSE FOR HIM.  PATIENT HAS CLINIC APPOINTMENT 03-26-21 WITH HIS PCP. PAPER WORK FOR PCS IS IN THE BLUE TEAM BOX FOR COMPLETION. PATIENT IS AWARE OF THIS APPOINTMENT.

## 2021-02-26 ENCOUNTER — Other Ambulatory Visit: Payer: Self-pay

## 2021-02-26 ENCOUNTER — Ambulatory Visit (HOSPITAL_COMMUNITY)
Admission: RE | Admit: 2021-02-26 | Discharge: 2021-02-26 | Disposition: A | Discharge status: Home / Self Care | Payer: Medicare Other | Source: Ambulatory Visit | Attending: Emergency Medicine | Admitting: Emergency Medicine

## 2021-02-26 DIAGNOSIS — C3490 Malignant neoplasm of unspecified part of unspecified bronchus or lung: Secondary | ICD-10-CM | POA: Diagnosis present

## 2021-02-28 ENCOUNTER — Ambulatory Visit: Payer: Medicare Other | Admitting: Emergency Medicine

## 2021-03-01 ENCOUNTER — Encounter: Payer: Self-pay | Admitting: Emergency Medicine

## 2021-03-01 ENCOUNTER — Ambulatory Visit (INDEPENDENT_AMBULATORY_CARE_PROVIDER_SITE_OTHER): Payer: Medicare Other | Admitting: Emergency Medicine

## 2021-03-01 ENCOUNTER — Other Ambulatory Visit: Payer: Self-pay

## 2021-03-01 DIAGNOSIS — K219 Gastro-esophageal reflux disease without esophagitis: Secondary | ICD-10-CM

## 2021-03-01 DIAGNOSIS — J432 Centrilobular emphysema: Secondary | ICD-10-CM

## 2021-03-01 DIAGNOSIS — C3490 Malignant neoplasm of unspecified part of unspecified bronchus or lung: Secondary | ICD-10-CM

## 2021-03-01 DIAGNOSIS — J301 Allergic rhinitis due to pollen: Secondary | ICD-10-CM

## 2021-03-01 MED ORDER — OMEPRAZOLE 40 MG PO CPDR: 40.0000 mg | DELAYED_RELEASE_CAPSULE | Freq: Every day | ORAL | 6 refills | Status: DC

## 2021-03-01 MED ORDER — DOXYCYCLINE HYCLATE 100 MG PO TABS: ORAL_TABLET | ORAL | 3 refills | Status: DC

## 2021-03-01 MED ORDER — AZITHROMYCIN 250 MG PO TABS: ORAL_TABLET | ORAL | 3 refills | Status: DC

## 2021-03-01 MED ORDER — CEFUROXIME AXETIL 250 MG PO TABS: ORAL_TABLET | ORAL | 3 refills | Status: DC

## 2021-03-01 MED ORDER — PREDNISONE 10 MG PO TABS: 10.0000 mg | ORAL_TABLET | Freq: Every day | ORAL | 3 refills | Status: DC

## 2021-03-01 NOTE — Assessment & Plan Note (Signed)
Please continue fluticasone nasal spray and Allegra as you have been taking them.

## 2021-03-01 NOTE — Assessment & Plan Note (Signed)
Please continue Trelegy 1 inhalation once daily.  Rinse and gargle after using. Keep albuterol available to use 2 puffs or 1 nebulizer treatment up to every 4 hours if needed for shortness of breath, chest tightness, wheezing. Continue prednisone 10 mg once daily. We will restart your rotating antibiotics for you to use during the first week of alternating months: -Cefuroxime 250 mg twice a day for 7 days (February, May, August, November) -Azithromycin 250 mg once daily for 5 days (March, June, September, December) -Doxycycline 100 mg twice a day for 7 days (April, July, October, January) Continue your oxygen at all times Follow with APP in 2-3 months to check on your breathing status Follow Dr. Lamonte Sakai in July after your CT chest to review the results together.

## 2021-03-01 NOTE — Patient Instructions (Signed)
Please continue Trelegy 1 inhalation once daily.  Rinse and gargle after using. Keep albuterol available to use 2 puffs or 1 nebulizer treatment up to every 4 hours if needed for shortness of breath, chest tightness, wheezing. Continue prednisone 10 mg once daily. We will restart your rotating antibiotics for you to use during the first week of alternating months: -Cefuroxime 250 mg twice a day for 7 days (February, May, August, November) -Azithromycin 250 mg once daily for 5 days (March, June, September, December) -Doxycycline 100 mg twice a day for 7 days (April, July, October, January) Please continue fluticasone nasal spray and Allegra as you have been taking them. Please continue omeprazole as you have been using it. Continue your oxygen at all times We will plan to repeat your CT scan of the chest in July 2023 to follow new right lower lobe pulmonary nodule.  Your other nodules that we have been following have resolved. Follow with APP in 2-3 months to check on your breathing status Follow Dr. Lamonte Sakai in July after your CT chest to review the results together.

## 2021-03-01 NOTE — Addendum Note (Signed)
Addended by: Gavin Potters R on: 03/01/2021 01:18 PM   Modules accepted: Orders

## 2021-03-01 NOTE — Assessment & Plan Note (Signed)
>>  ASSESSMENT AND PLAN FOR LARYNGOPHARYNGEAL REFLUX (LPR) WRITTEN ON 03/01/2021 12:27 PM BY BYRUM, LAMAR RAMAN, MD  Please continue omeprazole  as you have been using it.

## 2021-03-01 NOTE — Assessment & Plan Note (Signed)
Please continue omeprazole as you have been using it.

## 2021-03-01 NOTE — Addendum Note (Signed)
Addended by: Gavin Potters R on: 03/01/2021 02:32 PM   Modules accepted: Orders

## 2021-03-01 NOTE — Assessment & Plan Note (Signed)
2 pulmonary nodules that we have been following have resolved compared with 6 months prior.  There is a new right lower lobe focus, question inflammatory that will need to be followed 12 mm.  Next CT scan in July 2023

## 2021-03-01 NOTE — Progress Notes (Signed)
Subjective:    Patient ID: [name], male    DOB: 07-21-1955, 66 y.o.   MRN: [id number]  COPD He complains of cough and shortness of breath. There is no wheezing. Pertinent negatives include no ear pain, fever, headaches, postnasal drip, rhinorrhea, sneezing, sore throat or trouble swallowing. His past medical history is significant for COPD.  HPI     ROV 09/17/20 --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  Also history of adenocarcinoma of the lung bilaterally postsurgical resections.  Also with GERD and allergic rhinitis. Has had multiple exacerbations, including in May 2022.  Started him on cyclical antibiotics, unclear that he was taking these reliably but now reports that he is.  He is on Anoro, felt that it helped him more than Trelegy.  Using albuterol approximately few times a week.  Omeprazole 40 mg daily  Reports today that for last 2 weeks he has had fatigue, poor appetite. No fevers. Some increase in his nasal congestion. More SOB when he is supine, feels that he has to sit up. O2 is set on 2L/min. Coughs daily, clear-to-green.   CT chest 08/22/2020 reviewed by me, shows evidence of prior left upper lobe and right upper lobe resections.  Small left lower lobe pulmonary nodule 7 x 3 mm of unclear significance (new), 12 x 6 mm right lower lobe nodule.   ROV 01/15/21 --66 year old man whom I have followed for severe COPD, adenocarcinoma of the lung with bilateral surgical resection, associated hypoxemic respiratory failure.  He also has GERD, allergic rhinitis.  He had a new left lower lobe pulmonary nodule on his most recent CT chest in July 2022, has been following stable right lower lobe nodule.  Due for recheck 02/2021. I had been managing him on Anoro.  He was changed back to Trelegy and there is been some confusion as to which when he was supposed to stay on.  He has been on rotating antibiotics (cefuroxime, azithro, doxy),  prednisone 10 mg daily (ran out of this about 2 weeks ago). On flonase, omeprazole, allegra. He is on 2L/min. Difficulty telling whether the trelegy is any better than anoro.  Today he reports that he has been dealing with acute on chronic back pain. He sees [facility], uses muscle relaxer, tramadol, gets injections. The pain is impacting functional capacity, also his breathing.   ROV 03/01/21 --[name] is 9 and has a history of very severe COPD, adenocarcinomas of the lung for which she has undergone bilateral surgical resections.  He has associated hypoxemic respiratory failure.  We have been following pulmonary nodular disease including a stable right lower lobe 12 x 6 mm nodule, now a new 7 mm left lower lobe nodule.  He underwent CT chest as below Currently managed on Trelegy, prednisone 10 mg daily, rotating antibiotics (cefuroxime, azithromycin, doxycycline). On omeprazole, fluticasone nasal spray, Allegra for contributors to cough He is having daily cough, some throat irritation. He has intermittently seen some scant hemoptysis beginning 2 months ago. His throat sx have been present for several months. He saw ENT in Dec 22 >> lary w no evidence mass or any other abnormality  CT chest 02/26/2021 reviewed by me, shows a new 14 mm nodular opacity in the right lower lobe.  The previous 12 mm right lower lobe nodule has resolved, 7 mm nodule identified 08/2020 has resolved.  Review of Systems  Constitutional:  Negative for fever and unexpected weight change.  HENT:  Positive for congestion. Negative for dental problem, ear pain, nosebleeds, postnasal drip, rhinorrhea, sinus pressure, sneezing, sore throat and trouble swallowing.   Eyes:  Negative for redness and itching.  Respiratory:  Positive for cough and shortness of breath. Negative for chest  tightness and wheezing.   Cardiovascular:  Positive for palpitations. Negative for leg swelling.  Gastrointestinal:  Negative for nausea and vomiting.  Genitourinary:  Negative for dysuria.  Musculoskeletal:  Negative for joint swelling.  Skin:  Negative for rash.  Neurological:  Negative for headaches.  Hematological:  Does not bruise/bleed easily.  Psychiatric/Behavioral:  Positive for dysphoric mood. The patient is nervous/anxious.         Objective:   Physical Exam  Vitals:   03/01/21 1206  BP: 126/74  Pulse: 77  Temp: 98.1 F (36.7 C)  TempSrc: Oral  SpO2: 94%  Weight: 167 lb 9.6 oz (76 kg)  Height: 5\' 9"  (1.753 m)   Gen: Pleasant, well-nourished, in no distress, somewhat depressed affect  ENT: No lesions,  mouth clear,  oropharynx clear, no postnasal drip  Neck: No JVD, no stridor  Lungs: No use of accessory muscles, clear without rales or rhonchi, no wheeze  Cardiovascular: RRR, heart sounds normal, no murmur or gallops, no peripheral edema  Musculoskeletal: no deformities   Neuro: alert, non focal  Skin: Warm, no lesions or rashes     Assessment & Plan:  Lung cancer ([place]) 2 pulmonary nodules that we have been following have resolved compared with 6 months prior.  There is a new right lower lobe focus, question inflammatory that will need to be followed 12 mm.  Next CT scan in July 2023  COPD with emphysema [facility]) Please continue Trelegy 1 inhalation once daily.  Rinse and gargle after using. Keep albuterol available to use 2 puffs or 1 nebulizer treatment up to every 4 hours if needed for shortness of breath, chest tightness, wheezing. Continue prednisone 10 mg once daily. We will restart your rotating antibiotics for you to use during the first week of alternating months: -Cefuroxime 250 mg twice a day for 7 days (February, May, August, November) -Azithromycin 250 mg once daily for 5 days (March, June, September, December) -Doxycycline 100 mg twice a day  for 7 days (April, July, October, January) Continue your oxygen at all times Follow with APP in 2-3 months to check on your breathing status Follow Dr. Lamonte Sakai in July after your CT chest to review the results together.  Laryngopharyngeal reflux (LPR) Please continue omeprazole as you have been using it.  Allergic rhinitis Please continue fluticasone nasal spray and Allegra as you have been taking them.    Baltazar Apo, MD, PhD 03/01/2021, 12:27 PM [facility] [phone] or if no answer [phone]

## 2021-03-05 ENCOUNTER — Ambulatory Visit (HOSPITAL_COMMUNITY)
Admission: RE | Admit: 2021-03-05 | Discharge: 2021-03-05 | Disposition: A | Discharge status: Home / Self Care | Payer: Medicare Other | Source: Ambulatory Visit | Attending: Internal Medicine | Admitting: Internal Medicine

## 2021-03-05 ENCOUNTER — Other Ambulatory Visit: Payer: Self-pay

## 2021-03-05 DIAGNOSIS — R0602 Shortness of breath: Secondary | ICD-10-CM | POA: Diagnosis present

## 2021-03-05 DIAGNOSIS — I2510 Atherosclerotic heart disease of native coronary artery without angina pectoris: Secondary | ICD-10-CM | POA: Diagnosis not present

## 2021-03-05 DIAGNOSIS — I509 Heart failure, unspecified: Secondary | ICD-10-CM | POA: Insufficient documentation

## 2021-03-05 DIAGNOSIS — I110 Hypertensive heart disease with heart failure: Secondary | ICD-10-CM | POA: Diagnosis not present

## 2021-03-05 DIAGNOSIS — R600 Localized edema: Secondary | ICD-10-CM | POA: Diagnosis present

## 2021-03-05 DIAGNOSIS — M7989 Other specified soft tissue disorders: Secondary | ICD-10-CM

## 2021-03-05 DIAGNOSIS — J449 Chronic obstructive pulmonary disease, unspecified: Secondary | ICD-10-CM | POA: Diagnosis not present

## 2021-03-05 DIAGNOSIS — R0609 Other forms of dyspnea: Secondary | ICD-10-CM | POA: Diagnosis not present

## 2021-03-05 DIAGNOSIS — C3490 Malignant neoplasm of unspecified part of unspecified bronchus or lung: Secondary | ICD-10-CM | POA: Diagnosis not present

## 2021-03-05 DIAGNOSIS — R079 Chest pain, unspecified: Secondary | ICD-10-CM | POA: Diagnosis not present

## 2021-03-05 LAB — ECHOCARDIOGRAM COMPLETE
Area-P 1/2: 3.26 cm2
Calc EF: 53.4 %
S' Lateral: 3.5 cm
Single Plane A2C EF: 53.2 %
Single Plane A4C EF: 53.3 %

## 2021-03-05 NOTE — Progress Notes (Signed)
°  Echocardiogram 2D Echocardiogram has been performed.  Bobbye Charleston 03/05/2021, 4:09 PM

## 2021-03-06 ENCOUNTER — Encounter: Payer: Commercial Managed Care - HMO | Admitting: Surgery

## 2021-03-08 ENCOUNTER — Telehealth: Payer: Self-pay

## 2021-03-08 NOTE — Telephone Encounter (Signed)
[name], [name], [place] You are certainly welcome!        Previous Messages   ----- Message -----  From: Judge Stall, NT  Sent: 03/05/2021   2:38 PM EST  To: Patsy Baltimore  Subject: RE: HomeHealth for Nursing,PT,OTHome health *   Thank you so much for working with [place] I will note this in the chart [place], [place] C1/31/20232:38 PM   ----- Message -----  From: Patsy Baltimore  Sent: 03/05/2021   1:50 PM EST  To: Judge Stall, NT  Subject: RE: HomeHealth for [place] our clinician went to home yesterday and pt refused all [place] services.   Wanted to follow up and let you know. thanks.  [name]   ----- Message -----  From: Patsy Baltimore  Sent: 02/26/2021   3:26 PM EST  To: Patsy Baltimore, [place], NT  Subject: RE: HomeHealth for [place]  Sadly no, this pt has refused start of care numerous times. We went to home on 1/22 and attempted to start; however pt refused in person.  Our clinical manager spoke to May at your office today and obtain a delay order for 03/04/21 per pt's request.   Hopefully he'll let us start his care that day.   Thanks.  [name]   ----- Message -----  From: Judge Stall, NT  Sent: 02/26/2021   3:07 PM EST  To: Patsy Baltimore  Subject: RE: HomeHealth for Nursing,PT,OTHome health *   Just following up to see if you have a start of care date for this patient Terry [place] C1/24/20233:07 PM   ----- Message -----  From: Patsy Baltimore  Sent: 02/20/2021   4:19 PM EST  To: Patsy Baltimore, [place], NT  Subject: RE: HomeHealth for [place]  We are able to accept and we'll be in touch with pt to schedule.   Thanks.   ----- Message -----  From: Patsy Baltimore  Sent: 02/20/2021   3:37 PM EST  To: Patsy Baltimore, Judge Stall, NT  Subject: RE: HomeHealth for [place] is now on her maternity leave.  I've pulled this along with all her previous info and sent for review. I'll let you know. Thanks.   [name]   ----- Message -----  From: Judge Stall, [place]  Sent: 02/20/2021   3:08 PM EST  To: Patsy Baltimore  Subject: RE: HomeHealth for [place] has been updated will your agency be able to take patient?[place], [place] C1/18/20233:08 PM   ----- Message -----  From: Patsy Baltimore  Sent: 02/14/2021   1:09 PM EST  To: Patsy Baltimore, Elijio Miles, *  Subject: RE: HomeHealth for [place]  The order for home health must be written within 30 days of our start of care date. The office visit notes are valid for 90 days.   Hopefully this info helps. Thanks  [name]   ----- Message -----  From: Judge Stall, [place]  Sent: 02/14/2021  12:02 PM EST  To: Patsy Baltimore, Elijio Miles  Subject: RE: [facility] for [facility] health *   Patient had a visit with [place] on November 21,2022 is the order for October 2022 to old or does it need to be with in 60 or 90 days,just so I can explain to Doctor Dudley, [place] C1/12/202312:02  PM   ----- Message -----  From: Elijio Miles  Sent: 02/14/2021  10:38 AM EST  To: Patsy Baltimore, Elijio Miles, *  Subject: RE: HomeHealth for Nursing,PT,OTHome health *   Good morning Godric Lavell!   We should be able to accept this patient with a start of care for this weekend. Are you able to get [place] updated orders? Looks like the ones in the system are too far out.   Thank you,  Ramond Marrow   ----- Message -----  From: Elijio Miles  Sent: 02/12/2021  11:40 AM EST  To: Patsy Baltimore, Elijio Miles, *  Subject: RE: HomeHealth for Nursing,PT,OTHome health *   Good morning Cambelle Suchecki!   Unfortunately we are not able to accept Mr. Firmin at this time but there is a possibility we may have availability towards the end of the week and I am happy to retry then if you're not able to find another agency.    Just let me know!   Thank you,  Ramond Marrow   ----- Message -----  From: Patsy Baltimore  Sent: 02/11/2021   4:04 PM EST  To: Patsy Baltimore, Abelina Bachelor, Maylene Roes, *  Subject: RE: HomeHealth for [place]  I've pulled this referral and sent into the office for review. We'll let you know.   Thanks  [name]   ----- Message -----  From: Judge Stall, [place]  Sent: 02/11/2021   3:37 PM EST  To: Patsy Baltimore, Abelina Bachelor, Maylene Roes, *  Subject: HomeHealth for Nursing,PT,OTHome health Aid     Good afternoon just checking to see if you can help with this one [place], [place] C1/9/20233:37 PM

## 2021-03-08 NOTE — Telephone Encounter (Signed)
Pt declined all services.  Referral closed

## 2021-03-26 ENCOUNTER — Ambulatory Visit (INDEPENDENT_AMBULATORY_CARE_PROVIDER_SITE_OTHER): Payer: Medicare Other | Admitting: Student

## 2021-03-26 ENCOUNTER — Other Ambulatory Visit: Payer: Self-pay

## 2021-03-26 VITALS — BP 136/90 | HR 68 | Temp 97.7°F | Ht 69.0 in | Wt 168.4 lb

## 2021-03-26 DIAGNOSIS — M4712 Other spondylosis with myelopathy, cervical region: Secondary | ICD-10-CM | POA: Diagnosis not present

## 2021-03-26 DIAGNOSIS — G4700 Insomnia, unspecified: Secondary | ICD-10-CM | POA: Diagnosis not present

## 2021-03-26 DIAGNOSIS — I10 Essential (primary) hypertension: Secondary | ICD-10-CM | POA: Diagnosis not present

## 2021-03-26 DIAGNOSIS — Z79899 Other long term (current) drug therapy: Secondary | ICD-10-CM

## 2021-03-26 DIAGNOSIS — J432 Centrilobular emphysema: Secondary | ICD-10-CM

## 2021-03-26 MED ORDER — HYDROXYZINE HCL 25 MG PO TABS: 25.0000 mg | ORAL_TABLET | Freq: Four times a day (QID) | ORAL | 1 refills | Status: DC | PRN

## 2021-03-26 NOTE — Patient Instructions (Signed)
Thank you, TerryTerry Norman for allowing [place] to provide your care today. Today we discussed your back pain, COPD and insomnia.  Continue to follow-up with the pain specialist, orthopedic surgeon, and lung doctor as scheduled.  We will send over your application for personal care service and the nurse will come over to your house to do an assessment.   I have ordered the following medication/changed the following medications:  Refilled hydroxyzine 25 mg at bedtime as needed for sleep  My Chart Access: [email or web address]  Please follow-up as needed  Please make sure to arrive 15 minutes prior to your next appointment. If you arrive late, you may be asked to reschedule.    We look forward to seeing you next time. Please call our clinic at [phone] if you have any questions or concerns. The best time to call is Monday-Friday from 9am-4pm, but there is someone available 24/7. If after hours or the weekend, call the main hospital number and ask for the Internal Medicine Resident On-Call. If you need medication refills, please notify your pharmacy one week in advance and they will send [place] a request.   Thank you for letting us take part in your care. Wishing you the best!  Terry Axon, MD 03/26/2021, 3:20 PM IM Resident, PGY-2 [name] 41:10
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
## 2021-03-28 ENCOUNTER — Encounter: Payer: Self-pay | Admitting: Student

## 2021-03-28 ENCOUNTER — Telehealth: Payer: Self-pay | Admitting: *Deleted

## 2021-03-28 DIAGNOSIS — G4700 Insomnia, unspecified: Secondary | ICD-10-CM | POA: Insufficient documentation

## 2021-03-28 NOTE — Assessment & Plan Note (Addendum)
Patient continues to have moderate back pain due to multiple C-spine and L-spine abnormalities.  He has been following the pain clinic and was recently seen by the spine specialist. He has been getting steroid injections every 3 months but patient stated the face has started to walled off the last few months.  Pain specialist considering radiofrequency ablation of the spinal nerve. Spine specialist plan to get MRI C-spine and L-spine to further assess for progression of disease. Patient reports some difficulty with certain ADLs such as cooking and dressing due to worsening back and neck pain with standing up straight. His functional status has decreased over the years due to difficulty flexing his neck down to cook.  He has been eating TV dinners due to inability to cook.  He is only able to walk about 20 feet before he has to sit down to rest due to pain and shortness of breath. Patient would benefit from personal care services to help improve functional status.  Plan: -- Personal care service form filled out today -- Continue to follow spine specialist and pain management -- Continue Lyrica, Mobic and Robaxin -- Clinic follow-up as needed

## 2021-03-28 NOTE — Telephone Encounter (Signed)
PCS form faxed to [facility] to review -ph- [phone]  / [phone].  Left voice message for patient regarding PCS form being faxed and to be aware that their office will be contacting him to set up a in home nurse assessment visit. Will follow up with patient to check if has appointment.

## 2021-03-28 NOTE — Assessment & Plan Note (Signed)
Patient follows with Dr. Lamonte Sakai at [facility] pulmonology. Last office visit was January 27. Plan is to restart patient on rotating antibiotics during the first week of alternating months. Currently on 2 L [place] with O2 sat of 92% in clinic.  -- Continue to follow Dr. Lamonte Sakai -- Continue Trelegy, inhalers antibiotics and prednisone

## 2021-03-28 NOTE — Assessment & Plan Note (Signed)
BP relatively well controlled. SBP in the 120s during last visit but currently in the 130s. Would like to keep SBP below 130. Continue to monitor for now pending medication reconciliation by pharmacist.  Kidney function within normal limits.

## 2021-03-28 NOTE — Assessment & Plan Note (Signed)
Patient reports he has had difficulty staying asleep for the last few months. He has tried melatonin without significant change. Chronic neck and back pain likely contributing to patient's insomnia. Patient currently on hydroxyzine for anxiety however patient unsure if she is currently taking this. We will refill hydroxyzine for patient to take as needed for sleep. Patient instructed to call clinic for follow-up if this does not help so we can try different agent. -- Sleep hygiene education at next office visit

## 2021-03-28 NOTE — Assessment & Plan Note (Signed)
Patient on significant amount of medications. Some of these medications have expired and some without refills. Patient did not bring medication to clinic today. Will refer to clinical pharmacist to help with medication reconciliation in order to clean off his medication list and make the appropriate changes. -- Ambulatory referral to clinical pharmacist for med rec

## 2021-03-29 NOTE — Progress Notes (Signed)
Internal Medicine Clinic Attending  Case discussed with Dr. [place]  At the time of the visit.  We reviewed the resident's history and exam and pertinent patient test results.  I agree with the assessment, diagnosis, and plan of care documented in the resident's note.  

## 2021-04-03 ENCOUNTER — Encounter: Payer: Self-pay | Admitting: Surgery

## 2021-04-03 ENCOUNTER — Ambulatory Visit (INDEPENDENT_AMBULATORY_CARE_PROVIDER_SITE_OTHER): Payer: Medicare Other | Admitting: Surgery

## 2021-04-03 ENCOUNTER — Other Ambulatory Visit: Payer: Self-pay

## 2021-04-03 VITALS — BP 145/90 | HR 63 | Ht 69.0 in | Wt 168.0 lb

## 2021-04-03 DIAGNOSIS — Z85118 Personal history of other malignant neoplasm of bronchus and lung: Secondary | ICD-10-CM

## 2021-04-03 DIAGNOSIS — R911 Solitary pulmonary nodule: Secondary | ICD-10-CM | POA: Diagnosis not present

## 2021-04-03 NOTE — Progress Notes (Signed)
HPI:  Patient returns for routine surveillance having undergone Right VATS with wedge resection of a 1.4 cm RUL lung nodule on 07/03/2016. 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 This was his second lung cancer resection having undergone left upper lobectomy on 06/12/2011 for a stage IB (T2A, N0, M0) non-small cell lung cancer consistent with adenocarcinoma. This was a 2.0 cm tumor with visceral pleural invasion. He was found to have a small left renal lesion on abdominal ultrasound in September done for right upper quadrant and epigastric pain.  CT scan of the abdomen confirmed a 2.1 x 1.3 cm lesion in the upper aspect of the left kidney concerning for a small renal neoplasm.  He subsequently underwent an MRI of the abdomen which confirmed a 2.5 cm mass in the upper pole left kidney suspicious for renal cell carcinoma.  He underwent robotic assisted laparoscopic left partial nephrectomy by Dr. Lovena Neighbours on 05/24/2019.  His final pathology showed renal cell carcinoma with negative margins.  He said that he was also diagnosed with a small lesion in the right kidney which is being followed by Dr. Lovena Neighbours.  I last saw him in August 2022 and chest CT at that time showed new small left lower lobe and right lower lobe pulmonary nodules that were nonspecific and not felt to be suspicious morphologically for new primary lung cancers or metastatic lesions.  We decide to do a follow-up scan in 6 months.  He has continued to be followed by Dr. Lamonte Sakai for severe oxygen dependent COPD.  He continues to have shortness of breath most of the time.    Current Outpatient Medications  Medication Sig Dispense Refill   acetaminophen (TYLENOL) 500 MG tablet Take 500 mg by mouth every 6 (six) hours as needed for mild pain or headache.      albuterol (PROVENTIL HFA) 108 (90 Base) MCG/ACT inhaler INHALE 2 PUFFS BY MOUTH EVERY 6 HOURS AS NEEDED FOR WHEEZING OR SHORTNESS OF  BREATH 18 g 3   azithromycin (ZITHROMAX) 250 MG tablet 250 mg once daily for 5 days (March, June, September, December) 5 tablet 3   carboxymethylcellulose (REFRESH PLUS) 0.5 % SOLN Place 1 drop into both eyes 3 (three) times daily as needed (dry eyes).     cefUROXime (CEFTIN) 250 MG tablet 250 mg twice a day for 7 days (February, May, August, November) 14 tablet 3   dicyclomine (BENTYL) 10 MG capsule Take 1 capsule (10 mg total) by mouth 4 (four) times daily -  before meals and at bedtime. 120 capsule 1   doxycycline (VIBRA-TABS) 100 MG tablet 100 mg twice a day for 7 days (April, July, October, January) 14 tablet 3   escitalopram (LEXAPRO) 10 MG tablet Take 10 mg by mouth daily as needed (anxiety).      eszopiclone (LUNESTA) 1 MG TABS tablet Take 1 tablet (1 mg total) by mouth at bedtime as needed for sleep. Take immediately before bedtime 30 tablet 0   fexofenadine (ALLEGRA ALLERGY) 180 MG tablet Take 1 tablet (180 mg total) by mouth daily. 30 tablet 0   FLUoxetine (PROZAC) 10 MG capsule Take 10 mg by mouth daily as needed (Depression).     fluticasone (FLONASE) 50 MCG/ACT nasal spray Place 2 sprays into both nostrils daily.      Fluticasone-Umeclidin-Vilant (TRELEGY ELLIPTA) 100-62.5-25 MCG/ACT AEPB Inhale 1 puff into the lungs daily. 60 each 1   Guaifenesin 200 MG/5ML LIQD Take 10 mLs (400 mg total)  by mouth every 6 (six) hours as needed. 420 mL 1   hydrOXYzine (ATARAX) 25 MG tablet Take 1 tablet (25 mg total) by mouth every 6 (six) hours as needed for anxiety (or sleep). 30 tablet 1   ketoconazole (NIZORAL) 2 % shampoo Apply 1 application topically 2 (two) times a week.     meloxicam (MOBIC) 7.5 MG tablet Take 7.5 mg by mouth daily as needed for pain.     methocarbamol (ROBAXIN) 500 MG tablet Take 500 mg by mouth daily.     naproxen sodium (ALEVE) 220 MG tablet Take 220 mg by mouth daily as needed.     omeprazole (PRILOSEC) 40 MG capsule Take 1 capsule (40 mg total) by mouth daily. 30 capsule  6   ondansetron (ZOFRAN-ODT) 8 MG disintegrating tablet Place 8 mg inside cheek daily as needed for nausea/vomiting.     oxyCODONE (ROXICODONE) 5 MG immediate release tablet Take 1 tablet (5 mg total) by mouth every 6 (six) hours as needed for up to 15 doses for breakthrough pain. 15 tablet 0   OXYGEN Inhale 2 L into the lungs at bedtime.     predniSONE (DELTASONE) 10 MG tablet Take 1 tablet (10 mg total) by mouth daily with breakfast. 30 tablet 3   pregabalin (LYRICA) 100 MG capsule Take 100 mg by mouth daily.     promethazine (PHENERGAN) 12.5 MG tablet Take 1 tablet (12.5 mg total) by mouth every 4 (four) hours as needed for nausea or vomiting. 15 tablet 0   Simethicone 125 MG TABS Take 1 tablet (125 mg total) by mouth 3 (three) times daily as needed. 120 tablet 2   tamsulosin (FLOMAX) 0.4 MG CAPS capsule Take 0.4 mg by mouth daily.     sucralfate (CARAFATE) 1 g tablet Take 1 tablet (1 g total) by mouth 4 (four) times daily -  with meals and at bedtime. 120 tablet 0   No current facility-administered medications for this visit.     Physical Exam: BP (!) 145/90    Pulse 63    Ht 5\' 9"  (1.753 m)    Wt 168 lb (76.2 kg)    SpO2 90% Comment: 2L O2 per [place]   BMI 24.81 kg/m  He looks chronically ill and fatigued. There is no cervical or supraclavicular adenopathy. Lung exam reveals clear but distant breath sounds throughout. Cardiac exam shows a regular rate and rhythm with normal heart sounds.  There is no murmur.  Diagnostic Tests:  Narrative & Impression  CLINICAL DATA:  Non-small cell lung cancer post left lower lobectomy   EXAM: CT CHEST WITHOUT CONTRAST   TECHNIQUE: Multidetector CT imaging of the chest was performed using thin slice collimation for electromagnetic bronchoscopy planning purposes, without intravenous contrast.   RADIATION DOSE REDUCTION: This exam was performed according to the departmental dose-optimization program which includes automated exposure control,  adjustment of the mA and/or kV according to patient size and/or use of iterative reconstruction technique.   COMPARISON:  Multiple priors, most recent dated August 22, 2020   FINDINGS: Cardiovascular: Normal heart size. No pericardial effusion. Three vessel and left main coronary artery calcifications. Atherosclerotic disease of the thoracic aorta.   Mediastinum/Nodes: No pathologically enlarged lymph nodes seen in the chest. Esophagus and thyroid are unremarkable.   Lungs/Pleura: Prior left upper lobectomy. Remaining central airways are patent. Severe centrilobular emphysema. New nodular opacity of the right lower lobe seen on series 7 image 162 measuring 14 mm in mean diameter. Previously seen bilateral lower  lobe solid nodules have resolved.   Upper Abdomen: No acute abnormality.   Musculoskeletal: No chest wall mass or suspicious bone lesions identified.   IMPRESSION: 1. Prior left upper lobectomy with no evidence of recurrent disease. 2. New irregular nodular opacity of the right lower lobe measuring 14 mm in mean diameter. Previously seen bilateral lower lobe solid nodules have resolved, findings are potentially due to recurrent aspiration or chronic infection. Recommend short-term follow-up chest CT in 3 months to ensure resolution of right lower lobe nodule. 3. Coronary artery calcifications and aortic Atherosclerosis (ICD10-I70.0).     Electronically Signed   By: Yetta Glassman M.D.   On: 02/27/2021 16:40      Impression:  Super D chest CT from 02/26/2021 shows that the previously seen bilateral lower lobe solid nodules have resolved.  There is a new irregular nodular opacity in the right lower lobe measuring 14 mm.  Dr. Lamonte Sakai is planning a follow-up CT scan in a few months.  He is not an operative candidate and since he is going to be followed closely by Dr. Lamonte Sakai for severe oxygen dependent COPD I think the new lung nodule can be followed up by Dr. Lamonte Sakai without  need for a further visit to my office.  This will simplify his care.  I reviewed the CT images with him and answered his questions.  He is in agreement with this plan.  Plan:  He will continue to follow-up with Dr. Lamonte Sakai for his pulmonary care and lung nodule surveillance.  I spent 20 minutes performing this established patient evaluation and > 50% of this time was spent face to face counseling and coordinating the surveillance of his bilateral lung nodules.   Gaye Pollack, MD [facility] [phone]

## 2021-04-16 ENCOUNTER — Encounter: Payer: Self-pay | Admitting: Adult Health

## 2021-04-16 ENCOUNTER — Telehealth: Payer: Self-pay | Admitting: *Deleted

## 2021-04-16 ENCOUNTER — Other Ambulatory Visit: Payer: Self-pay

## 2021-04-16 ENCOUNTER — Ambulatory Visit (INDEPENDENT_AMBULATORY_CARE_PROVIDER_SITE_OTHER): Payer: Medicare Other | Admitting: Adult Health

## 2021-04-16 VITALS — BP 112/78 | HR 56 | Temp 97.8°F | Ht 69.0 in | Wt 165.0 lb

## 2021-04-16 DIAGNOSIS — J9611 Chronic respiratory failure with hypoxia: Secondary | ICD-10-CM

## 2021-04-16 DIAGNOSIS — R5381 Other malaise: Secondary | ICD-10-CM

## 2021-04-16 DIAGNOSIS — J432 Centrilobular emphysema: Secondary | ICD-10-CM

## 2021-04-16 DIAGNOSIS — C3490 Malignant neoplasm of unspecified part of unspecified bronchus or lung: Secondary | ICD-10-CM | POA: Diagnosis not present

## 2021-04-16 NOTE — Assessment & Plan Note (Addendum)
>>  ASSESSMENT AND PLAN FOR COPD WITH EMPHYSEMA (HCC) WRITTEN ON 04/16/2021 12:06 PM BY Jearldine Cassady S, NP  Very severe COPD with emphysema-patient has significant symptom burden.  He has physical deconditioning.  Would benefit from pulmonary rehab.  Referral will be placed today. Continue on triple therapy.  Rotating antibiotics chronic steroids with prednisone at 10 mg daily  Plan  Patient Instructions  Refer to Pulmonary Rehab.  Please continue Trelegy 1 inhalation once daily.  Rinse and gargle after using. Keep albuterol available to use 2 puffs or 1 nebulizer treatment up to every 4 hours if needed for shortness of breath, chest tightness, wheezing. Continue prednisone 10 mg once daily. Continue rotating antibiotics for you to use during the first week of alternating months: -Cefuroxime 250 mg twice a day for 7 days (February, May, August, November) -Azithromycin 250 mg once daily for 5 days (March, June, September, December) -Doxycycline 100 mg twice a day for 7 days (April, July, October, January) Please continue fluticasone nasal spray and Allegra as you have been taking them. Please continue omeprazole as you have been using it. Continue your oxygen at all times 2l/m   We will plan to repeat your CT scan of the chest in July 2023 to follow new right lower lobe pulmonary nodule.   Follow with In 2 months with Tyreisha Ungar NP  Follow Dr. Delton Coombes in July after your CT chest to review the results together.     >>ASSESSMENT AND PLAN FOR CHRONIC RESPIRATORY FAILURE WITH HYPOXIA (HCC) WRITTEN ON 04/16/2021 12:07 PM BY Srihaan Mastrangelo S, NP  Continue on oxygen 2 L to maintain O2 saturations greater than 88 to 90%.

## 2021-04-16 NOTE — Assessment & Plan Note (Signed)
Activity as tolerated.  Referral to pulmonary rehab ?

## 2021-04-16 NOTE — Assessment & Plan Note (Signed)
Continue on oxygen 2 L to maintain O2 saturations greater than 88 to 90% ?

## 2021-04-16 NOTE — Assessment & Plan Note (Signed)
History of lung cancer.  Continue with serial CT.  CT is scheduled for July 2023 ?
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
## 2021-04-16 NOTE — Telephone Encounter (Signed)
SPOKE WITH [name] REGARDING NURSE ASSESSMENT APPOINTMENT FOR [name]. PATIENT HAD APPOINTMENT ON MARCH 7.2023. ?

## 2021-04-16 NOTE — Patient Instructions (Addendum)
Refer to Pulmonary Rehab.  ?Please continue Trelegy 1 inhalation once daily.  Rinse and gargle after using. ?Keep albuterol available to use 2 puffs or 1 nebulizer treatment up to every 4 hours if needed for shortness of breath, chest tightness, wheezing. ?Continue prednisone 10 mg once daily. ?Continue rotating antibiotics for you to use during the first week of alternating months: ?-Cefuroxime 250 mg twice a day for 7 days (February, May, August, November) ?-Azithromycin 250 mg once daily for 5 days (March, June, September, December) ?-Doxycycline 100 mg twice a day for 7 days (April, July, October, January) ?Please continue fluticasone nasal spray and Allegra as you have been taking them. ?Please continue omeprazole as you have been using it. ?Continue your oxygen at all times 2l/m   ?We will plan to repeat your CT scan of the chest in July 2023 to follow new right lower lobe pulmonary nodule.   ?Follow with In 2 months with Chrisotpher Rivero NP  ?Follow Dr. Lamonte Sakai in July after your CT chest to review the results together. ?

## 2021-04-17 ENCOUNTER — Other Ambulatory Visit: Payer: Self-pay | Admitting: Otolaryngology

## 2021-04-17 ENCOUNTER — Other Ambulatory Visit (HOSPITAL_COMMUNITY): Payer: Self-pay | Admitting: Otolaryngology

## 2021-04-17 DIAGNOSIS — R1310 Dysphagia, unspecified: Secondary | ICD-10-CM

## 2021-04-18 ENCOUNTER — Encounter (HOSPITAL_COMMUNITY): Payer: Self-pay | Admitting: *Deleted

## 2021-04-18 NOTE — Progress Notes (Signed)
Received referral from Dr. Lamonte Sakai for this pt to participate in pulmonary rehab with the diagnosis of Gold COPD Stage 2.  Pt with full PFT which showed FEV1/FVC 46; FEV1 post BD 56.  Clinical review of pt follow up appt on 3/14 with Rexene Edison NP with Dr. Lamonte Sakai Pulmonary office note.  Pt with Covid Risk Score - 5. Pt appropriate for scheduling for Pulmonary rehab.  Will forward to support staff for scheduling and verification of insurance eligibility/benefits with pt consent. Cherre Huger, BSN ?Cardiac and Pulmonary Rehab Nurse Navigator  ? ?

## 2021-04-29 ENCOUNTER — Ambulatory Visit (INDEPENDENT_AMBULATORY_CARE_PROVIDER_SITE_OTHER): Payer: Medicare Other | Admitting: Student

## 2021-04-29 ENCOUNTER — Other Ambulatory Visit: Payer: Self-pay

## 2021-04-29 ENCOUNTER — Encounter: Payer: Self-pay | Admitting: Student

## 2021-04-29 VITALS — BP 109/83 | HR 96 | Temp 97.6°F | Ht 69.0 in | Wt 161.0 lb

## 2021-04-29 DIAGNOSIS — F419 Anxiety disorder, unspecified: Secondary | ICD-10-CM

## 2021-04-29 DIAGNOSIS — N179 Acute kidney failure, unspecified: Secondary | ICD-10-CM

## 2021-04-29 DIAGNOSIS — R252 Cramp and spasm: Secondary | ICD-10-CM

## 2021-04-29 DIAGNOSIS — M4712 Other spondylosis with myelopathy, cervical region: Secondary | ICD-10-CM

## 2021-04-29 DIAGNOSIS — R110 Nausea: Secondary | ICD-10-CM

## 2021-04-29 DIAGNOSIS — M79621 Pain in right upper arm: Secondary | ICD-10-CM

## 2021-04-29 MED ORDER — BUSPIRONE HCL 5 MG PO TABS: 5.0000 mg | ORAL_TABLET | Freq: Three times a day (TID) | ORAL | 0 refills | Status: AC | PRN

## 2021-04-29 MED ORDER — DULOXETINE HCL 30 MG PO CPEP
30.0000 mg | ORAL_CAPSULE | Freq: Every day | ORAL | 0 refills | Status: DC
Start: 2021-04-29 — End: 2021-08-20

## 2021-04-29 NOTE — Patient Instructions (Signed)
[name], it was a pleasure seeing you today! ? ?Today we discussed: ?- Anxiety: I am going to start you on two medications: One called duloxetine 30mg . This medication takes awhile ? ?- Arm/hand pain/numbness: I believe this is all related to your neck issue. Please call the neurosurgery office to set up an appointment. I am going to start you on a medication called duloxetine. This medication should help with the pain you are experiencing.  ? ?I have ordered the following labs today: ? ?Lab Orders    ?     CMP14 + Anion Gap    ?     TSH    ?     Iron, TIBC and Ferritin Panel    ?  ?I have ordered the following medication/changed the following medications:  ? ?Start the following medications: ?Meds ordered this encounter  ?Medications  ? DULoxetine (CYMBALTA) 30 MG capsule  ?  Sig: Take 1 capsule (30 mg total) by mouth daily.  ?  Dispense:  30 capsule  ?  Refill:  0  ? busPIRone (BUSPAR) 5 MG tablet  ?  Sig: Take 1 tablet (5 mg total) by mouth 3 (three) times daily as needed.  ?  Dispense:  90 tablet  ?  Refill:  0  ?  ? ?Follow-up:  1 month   ? ?Please make sure to arrive 15 minutes prior to your next appointment. If you arrive late, you may be asked to reschedule.  ? ?We look forward to seeing you next time. Please call our clinic at [phone] if you have any questions or concerns. The best time to call is Monday-Friday from 9am-4pm, but there is someone available 24/7. If after hours or the weekend, call the main hospital number and ask for the Internal Medicine Resident On-Call. If you need medication refills, please notify your pharmacy one week in advance and they will send [place] a request. ? ?Thank you for letting us take part in your care. Wishing you the best! ? ?Thank you, ?Sanjuan Dame, MD ? ?

## 2021-04-30 ENCOUNTER — Other Ambulatory Visit: Payer: Medicare Other

## 2021-04-30 DIAGNOSIS — R252 Cramp and spasm: Secondary | ICD-10-CM

## 2021-04-30 MED ORDER — ONDANSETRON HCL 4 MG PO TABS: 4.0000 mg | ORAL_TABLET | Freq: Three times a day (TID) | ORAL | 0 refills | Status: AC | PRN

## 2021-05-01 ENCOUNTER — Ambulatory Visit (HOSPITAL_COMMUNITY)
Admission: RE | Admit: 2021-05-01 | Discharge: 2021-05-01 | Disposition: A | Discharge status: Home / Self Care | Payer: Medicare Other | Source: Ambulatory Visit | Attending: Otolaryngology | Admitting: Otolaryngology

## 2021-05-01 ENCOUNTER — Other Ambulatory Visit: Payer: Self-pay

## 2021-05-01 DIAGNOSIS — R1310 Dysphagia, unspecified: Secondary | ICD-10-CM | POA: Diagnosis not present

## 2021-05-01 DIAGNOSIS — F419 Anxiety disorder, unspecified: Secondary | ICD-10-CM | POA: Insufficient documentation

## 2021-05-01 DIAGNOSIS — F32A Depression, unspecified: Secondary | ICD-10-CM | POA: Insufficient documentation

## 2021-05-01 DIAGNOSIS — R252 Cramp and spasm: Secondary | ICD-10-CM | POA: Insufficient documentation

## 2021-05-01 LAB — CMP14 + ANION GAP
ALT: 21 IU/L (ref 0–44)
AST: 22 IU/L (ref 0–40)
Albumin/Globulin Ratio: 1.8 (ref 1.2–2.2)
Albumin: 4.4 g/dL (ref 3.8–4.8)
Alkaline Phosphatase: 103 IU/L (ref 44–121)
Anion Gap: 13 mmol/L (ref 10.0–18.0)
BUN/Creatinine Ratio: 12 (ref 10–24)
BUN: 15 mg/dL (ref 8–27)
Bilirubin Total: 0.7 mg/dL (ref 0.0–1.2)
CO2: 23 mmol/L (ref 20–29)
Calcium: 9.8 mg/dL (ref 8.6–10.2)
Chloride: 100 mmol/L (ref 96–106)
Creatinine, Ser: 1.3 mg/dL — ABNORMAL HIGH (ref 0.76–1.27)
Globulin, Total: 2.4 g/dL (ref 1.5–4.5)
Glucose: 74 mg/dL (ref 70–99)
Potassium: 5 mmol/L (ref 3.5–5.2)
Sodium: 136 mmol/L (ref 134–144)
Total Protein: 6.8 g/dL (ref 6.0–8.5)
eGFR: 61 mL/min/{1.73_m2} (ref >59)

## 2021-05-01 LAB — IRON,TIBC AND FERRITIN PANEL
Ferritin: 388 ng/mL (ref 30–400)
Iron Saturation: 32 % (ref 15–55)
Iron: 101 ug/dL (ref 38–169)
Total Iron Binding Capacity: 315 ug/dL (ref 250–450)
UIBC: 214 ug/dL (ref 111–343)

## 2021-05-01 LAB — TSH: TSH: 0.785 u[IU]/mL (ref 0.450–4.500)
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
## 2021-05-01 NOTE — Assessment & Plan Note (Addendum)
Mr. Sheaffer is presenting today to discuss recent pain in his right hand/arm along with associated numbness. For the last couple of weeks he has noticed worsening sharp, shooting pains down his arm. He has not had this pain before. He denies any specific movements that trigger these symptoms. Mr. Schlitt further mentions he has tried multiple over the counter medications which has not controlled any of his pain. He believes this could possibly be due to a previous incident where glass was stuck in his hand.  ? ?On exam, motor function in tact, but there is decreased sensation in R hand digits 2-5. No signs of superior vena cava syndrome. Mr. Duross has a significant history of cervical spondylosis with multiple prior surgeries. He regularly follows with a spine specialist. He recently had repeat MRI of lumbar spine, which showed increased moderate spinal stenosis L4-L5 w/ new synovial cyst. I do not see where he has had a repeat MRI cervical spine. I have instructed Mr. Aguino to return to his spine specialist for further recommendations. Given personal history of cancer and new neurological findings, he likely would benefit from further imaging. We will plan to initiate duloxetine for neuropathy as well.  ? ?- Start Cymbalta 30mg  daily ?- Return to spine specialist as soon as possible  ?

## 2021-05-01 NOTE — Assessment & Plan Note (Signed)
Elevated Tbili during previous lab work. Patient has not known history of liver disease and is not reporting symptoms of gallbladder disease. We will re-check CMP today for further evaluation.  ? ?- Repeat CMP ?

## 2021-05-01 NOTE — Assessment & Plan Note (Signed)
[name] reports he has had worsening anxiety over the last year. He mentions he is constantly worried about something happening to him. Notes that he recently had a younger family member unexpectedly pass away, which has caused lots of anxiety throughout the family. Denies any specific situations where his anxiety is triggered or worse. Denies active SI/HI. He has previously been on SSRI's for depression/anxiety, but mentions that he would like something more acute.  ? ?We will start [name] on Cymbalta for radiculopathy as well as anxiety. I will also add buspirone for him to take on an as needed basis.  ? ?- Start Cymbalta 30mg , can up-titrate in a couple weeks ?- Start buspirone 5mg  three times daily as needed ?

## 2021-05-01 NOTE — Assessment & Plan Note (Signed)
Patient reports cramping of predominantly of his bilateral hands over the last few months. He mentions that these are worse at night typically. He has tried topical agents to help, but he continues to have these. He denies claudication symptoms.  ? ?No signs of peripheral arterial disease on exam. We will check TSH, iron studies today for any abnormalities. ? ?- Follow-up TSH, iron studies ?

## 2021-05-02 ENCOUNTER — Other Ambulatory Visit: Payer: Medicare Other

## 2021-05-03 ENCOUNTER — Other Ambulatory Visit: Payer: Self-pay | Admitting: Student

## 2021-05-03 ENCOUNTER — Encounter: Payer: Self-pay | Admitting: Student

## 2021-05-03 DIAGNOSIS — N179 Acute kidney failure, unspecified: Secondary | ICD-10-CM

## 2021-05-03 NOTE — Assessment & Plan Note (Signed)
BMP today revealed elevated sCr at 1.3. Patient does have an acute GI illness currently, reporting some vomiting and diarrhea for the last few days. I have been unable to reach him via telephone, but have sent a letter instructing him to return to clinic in 1-2 weeks to repeat BMP. ? ?- Repeat BMP pending ?

## 2021-05-10 ENCOUNTER — Telehealth (HOSPITAL_COMMUNITY): Payer: Self-pay

## 2021-05-10 NOTE — Telephone Encounter (Signed)
Called patient to see if he was interested in participating in the Pulmonary Rehab Program. Patient stated yes. Patient will come in for orientation on 05/22/2021@9 :00am and will attend the 1:15pm exercise class. ?  ?Tourist information centre manager. ?

## 2021-05-10 NOTE — Telephone Encounter (Signed)
Pt insurance is active and benefits verified through [facility] Medicare Co-pay 0, DED 0/0 met, out of pocket $8,300/$511.42 met, co-insurance 20%. no pre-authorization required.[name]/[employer] 05/10/2021_0 :45am, REF# [id number] ?  ?Will contact patient to see if he is interested in the Cardiac Rehab Program. If interested, patient will need to complete follow up appt. Once completed, patient will be contacted for scheduling upon review by the RN Navigator. ?

## 2021-05-20 ENCOUNTER — Telehealth: Payer: Self-pay | Admitting: Emergency Medicine

## 2021-05-20 NOTE — Telephone Encounter (Signed)
Called patient but he did not answer. Left message for him to call us back.  

## 2021-05-20 NOTE — Progress Notes (Signed)
Internal Medicine Clinic Attending ? ?Case discussed with Dr. Collene Gobble  at the time of the visit.  We reviewed the resident?s history and exam and pertinent patient test results.  I agree with the assessment, diagnosis, and plan of care documented in the resident?s note.  ?

## 2021-05-20 NOTE — Telephone Encounter (Signed)
Spoke with the pt  ?He says [employer] added humidifier bottle to his concentrator bc is nose was burning  ?Now his nose is better but he wakes up with chest pressure every am  ?He does fine during the day with his POC  ?Dr Lamonte Sakai, do you have any recommendations? Thanks! ?

## 2021-05-20 NOTE — Telephone Encounter (Signed)
I spoke with the pt and notified of response per Dr Lamonte Sakai  ?He verbalized understanding  ?Nothing further needed ?

## 2021-05-20 NOTE — Telephone Encounter (Signed)
Unclear why humidity would cause his chest to burn - please make sure that he uses distilled water. If her wants to stop it then he could instead try using nasal saline to help with the dry nose problem.  ?

## 2021-05-21 ENCOUNTER — Telehealth (HOSPITAL_COMMUNITY): Payer: Self-pay | Admitting: *Deleted

## 2021-05-21 NOTE — Telephone Encounter (Signed)
[name] to confirm his PR orientation appointment. He states that he will be coming. Discussed proper shoes, Covid precautions, mask directions to the department and our contact number. He voices understanding. ?

## 2021-05-22 ENCOUNTER — Encounter (HOSPITAL_COMMUNITY): Payer: Self-pay

## 2021-05-22 ENCOUNTER — Encounter (HOSPITAL_COMMUNITY)
Admission: RE | Admit: 2021-05-22 | Discharge: 2021-05-22 | Disposition: A | Discharge status: Home / Self Care | Payer: Medicare Other | Source: Ambulatory Visit | Attending: Emergency Medicine | Admitting: Emergency Medicine

## 2021-05-22 VITALS — BP 138/80 | HR 60 | Ht 69.0 in | Wt 164.0 lb

## 2021-05-22 DIAGNOSIS — J449 Chronic obstructive pulmonary disease, unspecified: Secondary | ICD-10-CM

## 2021-05-22 NOTE — Progress Notes (Signed)
[name] 66 y.o. male ? ?Initial Psychosocial Assessment ? ?Pt psychosocial assessment reveals pt lives with support group. Pt is currently unemployed, disabled. Pt hobbies include fishing. Pt reports his  stress level is moderate. Areas of stress/anxiety include Health.  Pt does exhibit signs of depression. Signs of depression include hopelessness and difficulty maintaining sleep. Si is on medication for anxiety and depression. He feels that his support group has benefited him. Pt shows fair  coping skills with positive outlook . Offered emotional support and reassurance. Monitor and evaluate progress toward psychosocial goal(s). ? ?Goal(s): ?Improved management of stress ?Improved coping skills ?Help patient work toward returning to meaningful activities that improve patient's QOL and are attainable with patient's lung disease ? ? ?05/22/2021 9:36 AM ?  ?

## 2021-05-22 NOTE — Progress Notes (Signed)
[name] 66 y.o. male ?Pulmonary Rehab Orientation Note ?This patient who was referred to Pulmonary Rehab by Dr. Lamonte Sakai with the diagnosis of COPD stage 2 arrived today in Cardiac and Pulmonary Rehab. He arrived with ambulatory normal gait. He does carry portable oxygen. [employer] is the provider for their DME. Per pt, [name] uses oxygen continuously. Color good, skin warm and dry. Patient is oriented to time and place. Patient's medical history, psychosocial health, and medications reviewed. Psychosocial assessment reveals pt lives with support group. [name] is currently unemployed, disabled. Pt hobbies include fishing. Pt reports his stress level is moderate. Areas of stress/anxiety include health. Pt does exhibit signs of depression. Signs of depression include hopelessness and difficulty maintaining sleep. PHQ2/9 score 1/4. He is on medication for his anxiety and depressions. He feels that he has benefited from his support group. [name] shows fair  coping skills with positive outlook on life. Offered emotional support and reassurance. Will continue to monitor and evaluate progress toward psychosocial goal(s) of improved management of stress, improved coping skills, and help patient work toward returning to meaningful activities that improve patient's QOL and are attainable with patient's lung disease. Physical assessment performed by Terry Small, RN. Please see their orientation physical assessment note. [name] reports he  does take medications as prescribed. Patient states he  follows a regular  diet. The patient reports no specific efforts to gain or lose weight.. Pt's weight will be monitored closely. Demonstration and practice of PLB using pulse oximeter. [name] to return demonstration satisfactorily. Safety and hand hygiene in the exercise area reviewed with patient. [name] understanding of the information reviewed. Department expectations discussed with patient and achievable  goals were set. The patient shows enthusiasm about attending the program and we look forward to working with [name] completed a 6 min walk test today and is scheduled to begin exercise on 05/28/21 at 1:15 pm.  ? ?[id number] ?Terry Plumber, MS, ACSM-CEP ?  ?
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
## 2021-05-22 NOTE — Progress Notes (Signed)
Pulmonary Rehab Orientation Physical Assessment Note ? ?Physical assessment reveals  Pt is alert and oriented x 3.  Heart rate is normal with occasional skip beat, breath sounds tight and diminished throughout  Reports non-productive cough. Bowel sounds present.  Pt denies abdominal discomfort, nausea, vomiting however does complain of diarrhea for a couple of weeks - thinks it is one of his new medication.  Alerted pulmonary rehab RN.  Grip strength equal, strong. Distal pulses palpable; no swelling to lower extremities. Cherre Huger, BSN ?Cardiac and Pulmonary Rehab Nurse Navigator  ? ? ?

## 2021-05-28 ENCOUNTER — Encounter (HOSPITAL_COMMUNITY): Payer: Medicare Other
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
## 2021-05-30 ENCOUNTER — Encounter: Payer: Self-pay | Admitting: Adult Health

## 2021-05-30 ENCOUNTER — Ambulatory Visit (INDEPENDENT_AMBULATORY_CARE_PROVIDER_SITE_OTHER): Payer: Medicare Other

## 2021-05-30 ENCOUNTER — Encounter: Payer: Medicare Other | Admitting: Internal Medicine

## 2021-05-30 ENCOUNTER — Ambulatory Visit (INDEPENDENT_AMBULATORY_CARE_PROVIDER_SITE_OTHER): Payer: Medicare Other | Admitting: Adult Health

## 2021-05-30 VITALS — BP 130/80 | HR 73 | Temp 97.6°F | Ht 69.0 in | Wt 166.6 lb

## 2021-05-30 DIAGNOSIS — J432 Centrilobular emphysema: Secondary | ICD-10-CM

## 2021-05-30 DIAGNOSIS — R5381 Other malaise: Secondary | ICD-10-CM | POA: Diagnosis not present

## 2021-05-30 DIAGNOSIS — J441 Chronic obstructive pulmonary disease with (acute) exacerbation: Secondary | ICD-10-CM

## 2021-05-30 DIAGNOSIS — I700 Atherosclerosis of aorta: Secondary | ICD-10-CM | POA: Diagnosis not present

## 2021-05-30 MED ORDER — CEFUROXIME AXETIL 250 MG PO TABS: 250.0000 mg | ORAL_TABLET | Freq: Two times a day (BID) | ORAL | 0 refills | Status: DC

## 2021-05-30 MED ORDER — PREDNISONE 20 MG PO TABS: 20.0000 mg | ORAL_TABLET | Freq: Every day | ORAL | 0 refills | Status: DC

## 2021-05-30 NOTE — Assessment & Plan Note (Signed)
Begin pulmonary rehab as discussed ?

## 2021-05-30 NOTE — Assessment & Plan Note (Signed)
COPD exacerbation.  Check chest x-ray today.  Patient will be starting his new cyclical antibiotic Ceftin and just a couple days.  We will get him to start this early.  Intake for 1 week.  Increase his prednisone up to 20 mg daily then back to 10 mg daily. ? ?Plan  ?Patient Instructions  ?Go ahead and start Cefuroxime tomorrow for week.  ?Please continue Trelegy 1 inhalation once daily.  Rinse and gargle after using. ?Keep albuterol inhaler every 6hr as needed for shortness of breath, chest tightness, wheezing. ?Increase prednisone 20mg  daily for 1 week , then 10mg  daily .  ?Continue rotating antibiotics for you to use during the first week of alternating months: ?-Cefuroxime 250 mg twice a day for 7 days (February, May, August, November) ?-Azithromycin 250 mg once daily for 5 days (March, June, September, December) ?-Doxycycline 100 mg twice a day for 7 days (April, July, October, January) ?Please continue fluticasone nasal spray and Allegra as you have been taking them. ?Please continue omeprazole as you have been using it. ?Continue your oxygen at all times 2l/m   ?Chest xray today  ?We will plan to repeat your CT scan of the chest in July 2023 to follow new right lower lobe pulmonary nodule.   ?Follow Dr. Lamonte Sakai in July after your CT chest to review the results together. ?  ? ?
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
## 2021-05-30 NOTE — Patient Instructions (Addendum)
Go ahead and start Cefuroxime tomorrow for week.  ?Please continue Trelegy 1 inhalation once daily.  Rinse and gargle after using. ?Keep albuterol inhaler every 6hr as needed for shortness of breath, chest tightness, wheezing. ?Increase prednisone 20mg  daily for 1 week , then 10mg  daily .  ?Continue rotating antibiotics for you to use during the first week of alternating months: ?-Cefuroxime 250 mg twice a day for 7 days (February, May, August, November) ?-Azithromycin 250 mg once daily for 5 days (March, June, September, December) ?-Doxycycline 100 mg twice a day for 7 days (April, July, October, January) ?Please continue fluticasone nasal spray and Allegra as you have been taking them. ?Please continue omeprazole as you have been using it. ?Continue your oxygen at all times 2l/m   ?Chest xray today  ?We will plan to repeat your CT scan of the chest in July 2023 to follow new right lower lobe pulmonary nodule.   ?Follow Dr. Lamonte Sakai in July after your CT chest to review the results together. ?

## 2021-05-30 NOTE — Assessment & Plan Note (Signed)
Continue on oxygen to maintain O2 saturations greater than 88 to 90%. 

## 2021-06-03 ENCOUNTER — Telehealth: Payer: Self-pay | Admitting: Emergency Medicine

## 2021-06-04 ENCOUNTER — Encounter (HOSPITAL_COMMUNITY)
Admission: RE | Admit: 2021-06-04 | Discharge: 2021-06-04 | Disposition: A | Discharge status: Home / Self Care | Payer: Medicare Other | Source: Ambulatory Visit | Attending: Emergency Medicine | Admitting: Emergency Medicine

## 2021-06-04 VITALS — Wt 165.1 lb

## 2021-06-04 DIAGNOSIS — Z5189 Encounter for other specified aftercare: Secondary | ICD-10-CM | POA: Diagnosis not present

## 2021-06-04 DIAGNOSIS — J449 Chronic obstructive pulmonary disease, unspecified: Secondary | ICD-10-CM

## 2021-06-04 MED ORDER — ALBUTEROL SULFATE HFA 108 (90 BASE) MCG/ACT IN AERS: INHALATION_SPRAY | RESPIRATORY_TRACT | 5 refills | Status: DC

## 2021-06-04 MED ORDER — TRELEGY ELLIPTA 100-62.5-25 MCG/ACT IN AEPB: 1.0000 | INHALATION_SPRAY | Freq: Every day | RESPIRATORY_TRACT | 5 refills | Status: DC

## 2021-06-04 NOTE — Telephone Encounter (Signed)
Rx for pt's Trelegy and albuterol inhalers have been sent to preferred pharmacy for pt. Attempted to call pt but unable to reach. Left him a detailed message letting him know that meds were refilled. Nothing further needed. ?

## 2021-06-04 NOTE — Progress Notes (Addendum)
Daily Session Note ? ?Patient Details  ?Name: [name] ?MRN: [id number] ?Date of Birth: May 10, 1955 ?Referring Provider:   ?Flowsheet Row Pulmonary Rehab Walk Test from 05/22/2021 in [place]  ?Referring Provider [place]  ? ?  ? ? ?Encounter Date: 06/04/2021 ? ?Check In: ? Session Check In - 06/04/21 1503   ? ?  ? Check-In  ? Supervising physician immediately available to respond to emergencies [facility] immediately available   ? Physician(s) Bonner Puna   ? Location [facility]   ? Staff Present Maurice Small, RN, Quentin Ore, MS, ACSM-CEP, Exercise Physiologist;Annedrea Rosezella [place], RN, MHA;Joan Leonia Reeves, RN, BSN   ? Virtual Visit No   ? Medication changes reported     No   ? Fall or balance concerns reported    No   ? Tobacco Cessation No Change   ? Warm-up and Cool-down Performed as group-led instruction   ? Resistance Training Performed Yes   ? VAD Patient? No   ? PAD/SET Patient? No   ?  ? Pain Assessment  ? Currently in Pain? No/denies   ? Pain Score 0-No pain   ? Multiple Pain Sites No   ? ?  ?  ? ?  ? ? ?Capillary Blood Glucose: ?No results found for this or any previous visit (from the past 24 hour(s)). ? ? Exercise Prescription Changes - 06/04/21 1500   ? ?  ? Response to Exercise  ? Blood Pressure (Admit) 140/70   ? Blood Pressure (Exercise) 144/72   ? Blood Pressure (Exit) 130/84   ? Heart Rate (Admit) 55 bpm   ? Heart Rate (Exercise) 80 bpm   ? Heart Rate (Exit) 70 bpm   ? Oxygen Saturation (Admit) 94 %   ? Oxygen Saturation (Exercise) 93 %   ? Oxygen Saturation (Exit) 97 %   ? Rating of Perceived Exertion (Exercise) 13   ? Perceived Dyspnea (Exercise) 3   ? Duration Progress to 30 minutes of  aerobic without signs/symptoms of physical distress   ? Intensity THRR unchanged   ?  ? Progression  ? Progression Continue to progress workloads to maintain intensity without signs/symptoms of physical distress.   ?  ? Resistance Training  ? Training  Prescription Yes   ? Weight red bands   ? Reps 10-15   ? Time 10 Minutes   ?  ? Oxygen  ? Oxygen Continuous   ? Liters 2   ?  ? Arm Ergometer  ? Level 1   ? Minutes 15   ? METs 1.9   ?  ? Track  ? Laps 4   ? Minutes 15   ? METs 1.46   ? ?  ?  ? ?  ? ? ?Social History  ? ?Tobacco Use  ?Smoking Status Former  ? Packs/day: 1.00  ? Years: 45.00  ? Pack years: 45.00  ? Types: Cigarettes  ? Start date: 36  ? Quit date: 06/01/2019  ? Years since quitting: 2.0  ?Smokeless Tobacco Never  ? ? ?Goals Met:  ?Exercise tolerated well ?No report of concerns or symptoms today ?Strength training completed today ? ?Goals Unmet:  ?Not Applicable ? ?Comments: Service time is from 1320 to 65.  ? ? ?Dr. Rodman Pickle is Medical Director for Pulmonary Rehab at [facility].  ?

## 2021-06-06 ENCOUNTER — Encounter (HOSPITAL_COMMUNITY): Payer: Medicare Other

## 2021-06-11 ENCOUNTER — Encounter (HOSPITAL_COMMUNITY): Payer: Medicare Other

## 2021-06-13 ENCOUNTER — Encounter (HOSPITAL_COMMUNITY)
Admission: RE | Admit: 2021-06-13 | Discharge: 2021-06-13 | Disposition: A | Discharge status: Home / Self Care | Payer: Medicare Other | Source: Ambulatory Visit | Attending: Emergency Medicine | Admitting: Emergency Medicine

## 2021-06-13 DIAGNOSIS — J449 Chronic obstructive pulmonary disease, unspecified: Secondary | ICD-10-CM | POA: Diagnosis not present

## 2021-06-13 NOTE — Progress Notes (Signed)
Daily Session Note ? ?Patient Details  ?Name: [name] ?MRN: [id number] ?Date of Birth: September 09, 1955 ?Referring Provider:   ?Flowsheet Row Pulmonary Rehab Walk Test from 05/22/2021 in [place]  ?Referring Provider [place]  ? ?  ? ? ?Encounter Date: 06/13/2021 ? ?Check In: ? Session Check In - 06/13/21 1537   ? ?  ? Check-In  ? Supervising physician immediately available to respond to emergencies [facility] immediately available   ? Physician(s) Dr. Sloan Leiter   ? Location [facility]   ? Staff Present Maurice Small, RN, Quentin Ore, MS, ACSM-CEP, Exercise Physiologist;Annedrea Rosezella [place], RN, MHA;Jetta Walker BS, ACSM EP-C, Exercise Physiologist   ? Virtual Visit No   ? Medication changes reported     No   ? Fall or balance concerns reported    No   ? Tobacco Cessation No Change   ? Warm-up and Cool-down Performed as group-led instruction   ? Resistance Training Performed Yes   ? VAD Patient? No   ? PAD/SET Patient? No   ?  ? Pain Assessment  ? Currently in Pain? No/denies   ? ?  ?  ? ?  ? ? ?Capillary Blood Glucose: ?No results found for this or any previous visit (from the past 24 hour(s)). ? ? ? ?Social History  ? ?Tobacco Use  ?Smoking Status Former  ? Packs/day: 1.00  ? Years: 45.00  ? Pack years: 45.00  ? Types: Cigarettes  ? Start date: 21  ? Quit date: 06/01/2019  ? Years since quitting: 2.0  ?Smokeless Tobacco Never  ? ? ?Goals Met:  ?Proper associated with RPD/PD & O2 Sat ?Exercise tolerated well ?No report of concerns or symptoms today ?Strength training completed today ? ?Goals Unmet:  ?Not Applicable ? ?Comments: Service time is from 1317 to 1430.  ? ? ?Dr. Rodman Pickle is Medical Director for Pulmonary Rehab at [facility]  ?

## 2021-06-17 ENCOUNTER — Telehealth (HOSPITAL_COMMUNITY): Payer: Self-pay | Admitting: *Deleted

## 2021-06-17 NOTE — Telephone Encounter (Signed)
Pt stopped by to let pulmonary rehab staff know he will be out on tomorrow  for exercise- 5/16.  Vadim has an appt with [place] [facility] at 2:00pm and will be unable to participate at 1:15. Cherre Huger, BSN ?Cardiac and Pulmonary Rehab Nurse Navigator  ? ?

## 2021-06-18 ENCOUNTER — Ambulatory Visit (INDEPENDENT_AMBULATORY_CARE_PROVIDER_SITE_OTHER): Payer: Medicare Other | Admitting: Adult Health

## 2021-06-18 ENCOUNTER — Encounter: Payer: Self-pay | Admitting: Adult Health

## 2021-06-18 VITALS — BP 128/88 | HR 78 | Temp 98.2°F | Ht 69.0 in | Wt 167.6 lb

## 2021-06-18 DIAGNOSIS — J432 Centrilobular emphysema: Secondary | ICD-10-CM

## 2021-06-18 DIAGNOSIS — C3490 Malignant neoplasm of unspecified part of unspecified bronchus or lung: Secondary | ICD-10-CM | POA: Diagnosis not present

## 2021-06-18 DIAGNOSIS — R5381 Other malaise: Secondary | ICD-10-CM

## 2021-06-18 DIAGNOSIS — J9611 Chronic respiratory failure with hypoxia: Secondary | ICD-10-CM | POA: Diagnosis not present

## 2021-06-18 MED ORDER — PREDNISONE 10 MG PO TABS: 10.0000 mg | ORAL_TABLET | Freq: Every day | ORAL | 3 refills | Status: DC

## 2021-06-18 MED ORDER — ALBUTEROL SULFATE (2.5 MG/3ML) 0.083% IN NEBU: 2.5000 mg | INHALATION_SOLUTION | Freq: Four times a day (QID) | RESPIRATORY_TRACT | 12 refills | Status: DC | PRN

## 2021-06-18 NOTE — Assessment & Plan Note (Addendum)
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  Patient had a flare last visit.  Seems to be returning back to baseline.  Continue on his aggressive maintenance regimen with triple therapy, rotating antibiotics and chronic steroids.  Patient education given on antibiotics and steroid use.  We will add an albuterol nebulizers as needed  Plan  Patient Instructions  Please continue Trelegy 1 inhalation once daily.  Rinse and gargle after using. Keep albuterol inhaler every 6hr as needed for shortness of breath, chest tightness, wheezing. Add Albuterol neb every 6hr as needed . Use inhaler or neb as needed.  Continue on Prednisone 10mg  daily  Continue rotating antibiotics for you to use during the first week of alternating months: -Cefuroxime 250 mg twice a day for 7 days (February, May, August, November) -Azithromycin 250 mg once daily for 5 days (March, June, September, December) -Doxycycline 100 mg twice a day for 7 days (April, July, October, January) Please continue fluticasone nasal spray and Allegra as you have been taking them. Please continue omeprazole as you have been using it. Continue your oxygen at all times 2l/m   Continue on Pulmonary rehab.  We will plan to repeat your CT scan of the chest in July 2023 to follow new right lower lobe pulmonary nodule.   Follow Dr. Delton Coombes in July after your CT chest to review the results together.     >>ASSESSMENT AND PLAN FOR CHRONIC RESPIRATORY FAILURE WITH HYPOXIA (HCC) WRITTEN ON 06/18/2021  2:39 PM BY Granvel Proudfoot S, NP  Continue on oxygen 2 L to maintain O2 saturations greater than 88 to 90%

## 2021-06-18 NOTE — Assessment & Plan Note (Signed)
Continue with activity as tolerated.  Continue with pulmonary rehab ?

## 2021-06-18 NOTE — Assessment & Plan Note (Signed)
Continue with serial CT.  Neck CT as planned in July. ?

## 2021-06-18 NOTE — Assessment & Plan Note (Signed)
Continue on oxygen 2 L to maintain O2 saturations greater than 88 to 90% ?

## 2021-06-18 NOTE — Patient Instructions (Addendum)
Please continue Trelegy 1 inhalation once daily.  Rinse and gargle after using. ?Keep albuterol inhaler every 6hr as needed for shortness of breath, chest tightness, wheezing. ?Add Albuterol neb every 6hr as needed . Use inhaler or neb as needed.  ?Continue on Prednisone 10mg  daily  ?Continue rotating antibiotics for you to use during the first week of alternating months: ?-Cefuroxime 250 mg twice a day for 7 days (February, May, August, November) ?-Azithromycin 250 mg once daily for 5 days (March, June, September, December) ?-Doxycycline 100 mg twice a day for 7 days (April, July, October, January) ?Please continue fluticasone nasal spray and Allegra as you have been taking them. ?Please continue omeprazole as you have been using it. ?Continue your oxygen at all times 2l/m   ?Continue on Pulmonary rehab.  ?We will plan to repeat your CT scan of the chest in July 2023 to follow new right lower lobe pulmonary nodule.   ?Follow Dr. Lamonte Sakai in July after your CT chest to review the results together. ?
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
## 2021-06-18 NOTE — Addendum Note (Signed)
Addended by: Vanessa Barbara on: 06/18/2021 02:46 PM ? ? Modules accepted: Orders ? ?

## 2021-06-19 ENCOUNTER — Telehealth (HOSPITAL_COMMUNITY): Payer: Self-pay | Admitting: *Deleted

## 2021-06-19 NOTE — Telephone Encounter (Signed)
Called [facility] due to his recent absences from the [facility] program. I was not able to reach him but, I was able to leave him a message. I ask him to return my call.  ? ?

## 2021-06-20 ENCOUNTER — Encounter (HOSPITAL_COMMUNITY)
Admission: RE | Admit: 2021-06-20 | Discharge: 2021-06-20 | Disposition: A | Discharge status: Home / Self Care | Payer: Medicare Other | Source: Ambulatory Visit | Attending: Emergency Medicine | Admitting: Emergency Medicine

## 2021-06-20 DIAGNOSIS — J449 Chronic obstructive pulmonary disease, unspecified: Secondary | ICD-10-CM

## 2021-06-20 NOTE — Progress Notes (Signed)
Daily Session Note  Patient Details  Name: [name] MRN: [id number] Date of Birth: 07-Aug-1955 Referring Provider:   April Manson Pulmonary Rehab Walk Test from 05/22/2021 in [facility]  Referring Provider [place]       Encounter Date: 06/20/2021  Check In:  Session Check In - 06/20/21 1458       Check-In   Supervising physician immediately available to respond to emergencies [facility] immediately available    Physician(s) Dr. Tawanna Solo    Location [facility]    Staff Present Elmon Else, MS, ACSM-CEP, Exercise Physiologist;Lisa Ysidro Evert, RN;Jahiem Franzoni Leonia Reeves, RN, BSN    Virtual Visit No    Medication changes reported     No    Fall or balance concerns reported    No    Tobacco Cessation No Change    Warm-up and Cool-down Performed as group-led Higher education careers adviser Performed Yes    VAD Patient? No    PAD/SET Patient? No      Pain Assessment   Currently in Pain? No/denies    Pain Score 0-No pain    Multiple Pain Sites No             Capillary Blood Glucose: No results found for this or any previous visit (from the past 24 hour(s)).    Social History   Tobacco Use  Smoking Status Former   Packs/day: 1.00   Years: 45.00   Pack years: 45.00   Types: Cigarettes   Start date: 1974   Quit date: 06/01/2019   Years since quitting: 2.0  Smokeless Tobacco Never    Goals Met:  Proper associated with RPD/PD & O2 Sat Exercise tolerated well No report of concerns or symptoms today Strength training completed today  Goals Unmet:  Not Applicable  Comments: Service time is from [place] to [place]    Dr. Rodman Pickle is Medical Director for Pulmonary Rehab at [facility].

## 2021-06-25 ENCOUNTER — Encounter (HOSPITAL_COMMUNITY)
Admission: RE | Admit: 2021-06-25 | Discharge: 2021-06-25 | Disposition: A | Discharge status: Home / Self Care | Payer: Medicare Other | Source: Ambulatory Visit | Attending: Emergency Medicine | Admitting: Emergency Medicine

## 2021-06-25 DIAGNOSIS — J449 Chronic obstructive pulmonary disease, unspecified: Secondary | ICD-10-CM | POA: Diagnosis not present

## 2021-06-25 NOTE — Progress Notes (Signed)
Daily Session Note  Patient Details  Name: [name] MRN: [id number] Date of Birth: 12/09/1955 Referring Provider:   Flowsheet Row Pulmonary Rehab Walk Test from 05/22/2021 in [facility]  Referring Provider Byrum       Encounter Date: 06/25/2021  Check In:  Session Check In - 06/25/21 1424       Check-In   Supervising physician immediately available to respond to emergencies [facility] immediately available    Physician(s) Dr. Adhikari    Location [facility]    Staff Present Kaylee Davis, MS, ACSM-CEP, Exercise Physiologist;Lisa Hughes, RN; , RN, BSN;Olinty Richards, MS, ACSM CEP, Exercise Physiologist    Virtual Visit No    Medication changes reported     No    Fall or balance concerns reported    No    Tobacco Cessation No Change    Warm-up and Cool-down Performed as group-led instruction    Resistance Training Performed Yes    VAD Patient? No    PAD/SET Patient? No      Pain Assessment   Currently in Pain? No/denies    Pain Score 0-No pain    Multiple Pain Sites No             Capillary Blood Glucose: No results found for this or any previous visit (from the past 24 hour(s)).    Social History   Tobacco Use  Smoking Status Former   Packs/day: 1.00   Years: 45.00   Pack years: 45.00   Types: Cigarettes   Start date: 1974   Quit date: 06/01/2019   Years since quitting: 2.0  Smokeless Tobacco Never    Goals Met:  Proper associated with RPD/PD & O2 Sat Exercise tolerated well No report of concerns or symptoms today Strength training completed today  Goals Unmet:  Not Applicable  Comments: Service time is from 1320 to 1437    Dr. Jane Ellison is Medical Director for Pulmonary Rehab at [facility].  

## 2021-06-26 NOTE — Progress Notes (Signed)
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 breath during exertion;Long Term: Able to use Dyspnea scale to guide intensity level when exercising independently Short Term: Able to use Dyspnea scale daily in rehab to express subjective sense of shortness of breath during exertion;Long Term: Able to use Dyspnea scale to guide intensity level when exercising independently     Knowledge and understanding of Target Heart Rate Range (THRR) Yes Yes Yes     Intervention Provide education and explanation of THRR including how the numbers were predicted and where they are located for reference Provide education and explanation of THRR including how the numbers were predicted and where they are located for reference Provide education and explanation of THRR including how the numbers  were predicted and where they are located for reference      Expected Outcomes Short Term: Able to state/look up THRR;Long Term: Able to use THRR to govern intensity when exercising independently;Short Term: Able to use daily as guideline for intensity in rehab Short Term: Able to state/look up THRR;Long Term: Able to use THRR to govern intensity when exercising independently;Short Term: Able to use daily as guideline for intensity in rehab Short Term: Able to state/look up THRR;Long Term: Able to use THRR to govern intensity when exercising independently;Short Term: Able to use daily as guideline for intensity in rehab     Understanding of Exercise Prescription Yes Yes Yes     Intervention Provide education, explanation, and written materials on patient's individual exercise prescription Provide education, explanation, and written materials on patient's individual exercise prescription Provide education, explanation, and written materials on patient's individual exercise prescription     Expected Outcomes Short Term: Able to explain program exercise prescription;Long Term: Able to explain home exercise prescription to exercise independently Short Term: Able to explain program exercise prescription;Long Term: Able to explain home exercise prescription to exercise independently Short Term: Able to explain program exercise prescription;Long Term: Able to explain home exercise prescription to exercise independently              Exercise Goals Re-Evaluation :  Exercise Goals Re-Evaluation     Row Name 05/28/21 0813 06/19/21 1152           Exercise Goal Re-Evaluation   Exercise Goals Review Increase Physical Activity;Increase Strength and Stamina;Able to understand and use rate of perceived exertion (RPE) scale;Able to understand and use Dyspnea scale;Knowledge and understanding of Target Heart Rate Range (THRR);Understanding of Exercise Prescription Increase Physical Activity;Increase Strength and Stamina;Able to understand and use rate of perceived exertion  (RPE) scale;Able to understand and use Dyspnea scale;Knowledge and understanding of Target Heart Rate Range (THRR);Understanding of Exercise Prescription      Comments Braden is scheduled to begin exercise this week. Will continue to monitor and progress as able. Doyle has completed 2 exercise sessions. He exercises for 15 min on the arm ergometer and track. He averages 2.2 METs at level 1 on the arm ergometer and 1.93 METs on the track. He performs the warmup and cooldown standing without limitations. He has missed several sessions for appointments and personal health reasons. It has been difficult to progress him because of his absences. Will continue to monitor and progress as able.      Expected Outcomes Through exercise at rehab and home the patient will decrease shortness of breath with daily activities and feel confident in carrying out an exercise regimn at home. Through exercise at rehab and home the patient will decrease shortness of breath with daily activities and feel confident in carrying out an exercise regimn at home.               Discharge Exercise Prescription (Final Exercise Prescription Changes):  Exercise Prescription Changes - 06/18/21 1500       Response to Exercise   Blood Pressure (Admit) 142/80    Blood Pressure (Exit) 122/86    Heart Rate (Admit) 66 bpm    Heart Rate (Exercise) 93 bpm    Heart Rate (Exit) 52 bpm    Oxygen Saturation (Admit) 93 %    Oxygen Saturation (Exercise) 93 %    Oxygen Saturation (Exit) 96 %    Rating of Perceived Exertion (Exercise) 13    Perceived Dyspnea (Exercise) 2  Duration Progress to 30 minutes of  aerobic without signs/symptoms of physical distress    Intensity THRR unchanged      Progression   Progression Continue to progress workloads to maintain intensity without signs/symptoms of physical distress.      Resistance Training   Training Prescription Yes    Weight red bands    Reps 10-15    Time 10 Minutes       Oxygen   Oxygen Continuous    Liters 2      Arm Ergometer   Level 1    Minutes 15    METs 2.2      Track   Laps 8    Minutes 15    METs 1.93      Oxygen   Maintain Oxygen Saturation 88% or higher             Nutrition:  Target Goals: Understanding of nutrition guidelines, daily intake of sodium <1568m, cholesterol <2036m calories 30% from fat and 7% or less from saturated fats, daily to have 5 or more servings of fruits and vegetables.  Biometrics:   Post Biometrics - 05/22/21 0826        Post  Biometrics   Height 5' 9"  (1.753 m)    BMI (Calculated) 24.21    Grip Strength 29 kg             Nutrition Therapy Plan and Nutrition Goals:  Nutrition Therapy & Goals - 06/20/21 1501       Nutrition Therapy   Diet Heart Healthy Diet      Personal Nutrition Goals   Nutrition Goal Patient to build a healthy plate to include lean protein/plant protein, fruits, vegetables, whole grains, and low fat dairy as part of a heart healthy meal plan.    Personal Goal #2 Patient to implement >2-5 vegetables servings daily.    Personal Goal #3 Patient to implement >1 fruit serving daily    Personal Goal #4 Patient to implement Nutrition supplements as needed to support calorie needs.    Comments Reviewed dietary goals. Patient reports some days with poor appetite; disuccsed the importance of maintaining calorie intake. Encouraged patient to consider Boost/Ensure to support nutrition needs as needed. Encouraged follow-up with GI doctor regarding ongoing upper and lower GI pain.      Intervention Plan   Intervention Nutrition handout(s) given to patient.;Prescribe, educate and counsel regarding individualized specific dietary modifications aiming towards targeted core components such as weight, hypertension, lipid management, diabetes, heart failure and other comorbidities.   Gave handouts on convenience foods and the plate method   Expected Outcomes Long Term Goal: Adherence to  prescribed nutrition plan.             Nutrition Assessments:  MEDIFICTS Score Key: ?70 Need to make dietary changes  40-70 Heart Healthy Diet ? 40 Therapeutic Level Cholesterol Diet   Picture Your Plate Scores: <4<26nhealthy dietary pattern with much room for improvement. 41-50 Dietary pattern unlikely to meet recommendations for good health and room for improvement. 51-60 More healthful dietary pattern, with some room for improvement.  >60 Healthy dietary pattern, although there may be some specific behaviors that could be improved.    Nutrition Goals Re-Evaluation:   Nutrition Goals Discharge (Final Nutrition Goals Re-Evaluation):   Psychosocial: Target Goals: Acknowledge presence or absence of significant depression and/or stress, maximize coping skills, provide positive support system. Participant is able to verbalize types and ability to use techniques and skills needed for reducing  stress and depression.  Initial Review & Psychosocial Screening:  Initial Psych Review & Screening - 05/22/21 0920       Initial Review   Current issues with History of Depression;Current Depression;Current Anxiety/Panic;Current Sleep Concerns   Is in addicts support group. Is on antidepressants     Family Dynamics   Good Support System? Yes    Comments addicts support group      Barriers   Psychosocial barriers to participate in program The patient should benefit from training in stress management and relaxation.      Screening Interventions   Interventions Encouraged to exercise;To provide support and resources with identified psychosocial needs    Expected Outcomes Long Term Goal: Stressors or current issues are controlled or eliminated.;Long Term goal: The participant improves quality of Life and PHQ9 Scores as seen by post scores and/or verbalization of changes             Quality of Life Scores:  Scores of 19 and below usually indicate a poorer quality of life in these  areas.  A difference of  2-3 points is a clinically meaningful difference.  A difference of 2-3 points in the total score of the Quality of Life Index has been associated with significant improvement in overall quality of life, self-image, physical symptoms, and general health in studies assessing change in quality of life.  PHQ-9: Review Flowsheet        05/22/2021 04/29/2021 03/26/2021 12/24/2020 12/13/2020  Depression screen PHQ 2/9  Decreased Interest 0 1 0 0 2  Down, Depressed, Hopeless 1 0 1 1 2   PHQ - 2 Score 1 1 1 1 4   Altered sleeping 1 3  3  0  Tired, decreased energy 1 1  0 2  Change in appetite 0 1  0 1  Feeling bad or failure about yourself  0 0  0 0  Trouble concentrating 1 0  0 0  Moving slowly or fidgety/restless 0 0  0 0  Suicidal thoughts 0 0  0 0  PHQ-9 Score 4 6  4 7   Difficult doing work/chores Very difficult Not difficult at all  Not difficult at all Not difficult at all         Interpretation of Total Score  Total Score Depression Severity:  1-4 = Minimal depression, 5-9 = Mild depression, 10-14 = Moderate depression, 15-19 = Moderately severe depression, 20-27 = Severe depression   Psychosocial Evaluation and Intervention:  Psychosocial Evaluation - 05/22/21 0922       Psychosocial Evaluation & Interventions   Interventions Stress management education;Relaxation education;Encouraged to exercise with the program and follow exercise prescription    Comments For patient to manage stress and anxiety in healthy ways.    Expected Outcomes QOL will improve as patient is able to learn to relax and reduces his stress.    Continue Psychosocial Services  Follow up required by staff             Psychosocial Re-Evaluation:  Psychosocial Re-Evaluation     [place] Name 05/28/21 0953 06/25/21 0910           Psychosocial Re-Evaluation   Current issues with Current Depression;History of Depression;Current Anxiety/Panic;Current Sleep Concerns Current  Depression;History of Depression;Current Sleep Concerns;Current Anxiety/Panic      Comments Rieley has not started exercising in pulmonary rehab d/t MD appts, unable to assess psychosocial status until he returns to the program.  He attends an addict support group [place] has asked for names of therapists so  talk to re: anxiety, I gave him 2 sources.  He is also trying to switch to the [place] for his medical care.  His attendance is irregular, he voices he will try harder to participate.  He has chronic back pain which is problematic.  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      Expected Outcomes For Thanh to be stable on current treatment for psychosocial status. --      Interventions Relaxation education;Stress management education;Encouraged to attend Pulmonary Rehabilitation for the exercise Therapist referral;Encouraged to attend Pulmonary Rehabilitation for the exercise;Stress management education;Relaxation education      Continue Psychosocial Services  Follow up required by staff Follow up required by staff               Psychosocial Discharge (Final Psychosocial Re-Evaluation):  Psychosocial Re-Evaluation - 06/25/21 0910       Psychosocial Re-Evaluation   Current issues with Current Depression;History of Depression;Current Sleep Concerns;Current Anxiety/Panic    Comments [place] has asked for names of therapists so talk to re: anxiety, I gave him 2 sources.  He is also trying to switch to the [place] for his medical care.  His attendance is irregular, he voices he will try harder to participate.  He has chronic back pain which is problematic.  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    Interventions Therapist referral;Encouraged to attend Pulmonary Rehabilitation for the exercise;Stress management education;Relaxation education    Continue Psychosocial Services  Follow up required by staff             Education: Education Goals: Education classes will be provided on a weekly basis,  covering required topics. Participant will state understanding/return demonstration of topics presented.  Learning Barriers/Preferences:  Learning Barriers/Preferences - 05/22/21 1027       Learning Barriers/Preferences   Learning Barriers None    Learning Preferences Audio;Computer/Internet;Group Instruction;Individual Instruction;Pictoral;Skilled Demonstration;Verbal Instruction;Video;Written Material             Education Topics: Risk Factor Reduction:  -Group instruction that is supported by a PowerPoint presentation. Instructor discusses the definition of a risk factor, different risk factors for pulmonary disease, and how the heart and lungs work together.     Nutrition for Pulmonary Patient:  -Group instruction provided by PowerPoint slides, verbal discussion, and written materials to support subject matter. The instructor gives an explanation and review of healthy diet recommendations, which includes a discussion on weight management, recommendations for fruit and vegetable consumption, as well as protein, fluid, caffeine, fiber, sodium, sugar, and alcohol. Tips for eating when patients are short of breath are discussed. Flowsheet Row PULMONARY REHAB CHRONIC OBSTRUCTIVE PULMONARY DISEASE from 06/20/2021 in [place]  Date 06/13/21  Educator sam  Instruction Review Code 1- Verbalizes Understanding       Pursed Lip Breathing:  -Group instruction that is supported by demonstration and informational handouts. Instructor discusses the benefits of pursed lip and diaphragmatic breathing and detailed demonstration on how to preform both.     Oxygen Safety:  -Group instruction provided by PowerPoint, verbal discussion, and written material to support subject matter. There is an overview of "What is Oxygen" and "Why do we need it".  Instructor also reviews how to create a safe environment for oxygen use, the importance of using oxygen as prescribed,  and the risks of noncompliance. There is a brief discussion on traveling with oxygen and resources the patient may utilize.   Oxygen Equipment:  -Group instruction provided by Home  Health Staff utilizing handouts, written materials, and equipment demonstrations.   Signs and Symptoms:  -Group instruction provided by written material and verbal discussion to support subject matter. Warning signs and symptoms of infection, stroke, and heart attack are reviewed and when to call the physician/911 reinforced. Tips for preventing the spread of infection discussed.   Advanced Directives:  -Group instruction provided by verbal instruction and written material to support subject matter. Instructor reviews Advanced Directive laws and proper instruction for filling out document.   Pulmonary Video:  -Group video education that reviews the importance of medication and oxygen compliance, exercise, good nutrition, pulmonary hygiene, and pursed lip and diaphragmatic breathing for the pulmonary patient.   Exercise for the Pulmonary Patient:  -Group instruction that is supported by a PowerPoint presentation. Instructor discusses benefits of exercise, core components of exercise, frequency, duration, and intensity of an exercise routine, importance of utilizing pulse oximetry during exercise, safety while exercising, and options of places to exercise outside of rehab.     Pulmonary Medications:  -Verbally interactive group education provided by instructor with focus on inhaled medications and proper administration.   Anatomy and Physiology of the Respiratory System and Intimacy:  -Group instruction provided by PowerPoint, verbal discussion, and written material to support subject matter. Instructor reviews respiratory cycle and anatomical components of the respiratory system and their functions. Instructor also reviews differences in obstructive and restrictive respiratory diseases with examples of each.  Intimacy, Sex, and Sexuality differences are reviewed with a discussion on how relationships can change when diagnosed with pulmonary disease. Common sexual concerns are reviewed.   MD DAY -A group question and answer session with a medical doctor that allows participants to ask questions that relate to their pulmonary disease state.   OTHER EDUCATION -Group or individual verbal, written, or video instructions that support the educational goals of the pulmonary rehab program. [place] from 06/20/2021 in [place]  Date 06/20/21  [facility] Levels]  Educator Donnetta Simpers  Instruction Review Code 1- Verbalizes Understanding       Holiday Eating Survival Tips:  -Group instruction provided by [employer], verbal discussion, and written materials to support subject matter. The instructor gives patients tips, tricks, and techniques to help them not only survive but enjoy the holidays despite the onslaught of food that accompanies the holidays.   Knowledge Questionnaire Score:  Knowledge Questionnaire Score - 05/22/21 1529       Knowledge Questionnaire Score   Pre Score 13/18             Core Components/Risk Factors/Patient Goals at Admission:  Personal Goals and Risk Factors at Admission - 05/22/21 0923       Core Components/Risk Factors/Patient Goals on Admission   Improve shortness of breath with ADL's Yes    Intervention Provide education, individualized exercise plan and daily activity instruction to help decrease symptoms of SOB with activities of daily living.    Expected Outcomes Short Term: Improve cardiorespiratory fitness to achieve a reduction of symptoms when performing ADLs;Long Term: Be able to perform more ADLs without symptoms or delay the onset of symptoms    Increase knowledge of respiratory medications and ability to use respiratory devices properly  Yes    Intervention Provide  education and demonstration as needed of appropriate use of medications, inhalers, and oxygen therapy.    Expected Outcomes Short Term: Achieves understanding of medications use. Understands that oxygen is a medication prescribed by physician. Demonstrates  appropriate use of inhaler and oxygen therapy.;Long Term: Maintain appropriate use of medications, inhalers, and oxygen therapy.             Core Components/Risk Factors/Patient Goals Review:   Goals and Risk Factor Review     Row Name 05/28/21 0956 06/25/21 0913           Core Components/Risk Factors/Patient Goals Review   Personal Goals Review Develop more efficient breathing techniques such as purse lipped breathing and diaphragmatic breathing and practicing self-pacing with activity.;Increase knowledge of respiratory medications and ability to use respiratory devices properly.;Improve shortness of breath with ADL's Develop more efficient breathing techniques such as purse lipped breathing and diaphragmatic breathing and practicing self-pacing with activity.;Increase knowledge of respiratory medications and ability to use respiratory devices properly.;Improve shortness of breath with ADL's      Review Has not started program, will hopefully begin 06/04/2021. Danna has only attended 3 exercise sessions sine starting the end of April.  He has chronic back pain and gets injections which help somewhat.  He has been slow to progress with his exercise d/t irregular attendance and back pain and other appoints scheduled previously on the days of pulmonary rehab.      Expected Outcomes See admission goals. See admission goals               Core Components/Risk Factors/Patient Goals at Discharge (Final Review):   Goals and Risk Factor Review - 06/25/21 0913       Core Components/Risk Factors/Patient Goals Review   Personal Goals Review Develop more efficient breathing techniques such as purse lipped breathing and diaphragmatic breathing  and practicing self-pacing with activity.;Increase knowledge of respiratory medications and ability to use respiratory devices properly.;Improve shortness of breath with ADL's    Review Kenson has only attended 3 exercise sessions sine starting the end of April.  He has chronic back pain and gets injections which help somewhat.  He has been slow to progress with his exercise d/t irregular attendance and back pain and other appoints scheduled previously on the days of pulmonary rehab.    Expected Outcomes See admission goals             ITP Comments: Pt is making expected progress toward Pulmonary Rehab goals after completing 4 sessions. Recommend continued exercise, life style modification, education, and utilization of breathing techniques to increase stamina and strength, while also decreasing shortness of breath with exertion.  Dr. Rodman Pickle is Medical Director for Pulmonary Rehab at [facility].

## 2021-06-27 ENCOUNTER — Encounter (HOSPITAL_COMMUNITY)
Admission: RE | Admit: 2021-06-27 | Discharge: 2021-06-27 | Disposition: A | Discharge status: Home / Self Care | Payer: Medicare Other | Source: Ambulatory Visit | Attending: Emergency Medicine | Admitting: Emergency Medicine

## 2021-06-27 ENCOUNTER — Ambulatory Visit
Admission: RE | Admit: 2021-06-27 | Discharge: 2021-06-27 | Disposition: A | Discharge status: Home / Self Care | Payer: Medicare Other | Source: Ambulatory Visit | Attending: Emergency Medicine | Admitting: Emergency Medicine

## 2021-06-27 VITALS — Wt 169.3 lb

## 2021-06-27 DIAGNOSIS — C3490 Malignant neoplasm of unspecified part of unspecified bronchus or lung: Secondary | ICD-10-CM

## 2021-06-27 DIAGNOSIS — J449 Chronic obstructive pulmonary disease, unspecified: Secondary | ICD-10-CM

## 2021-06-27 NOTE — Progress Notes (Signed)
Daily Session Note  Patient Details  Name: REMBERTO LIENHARD MRN: [id number] Date of Birth: 10/01/1955 Referring Provider:   April Manson Pulmonary Rehab Walk Test from 05/22/2021 in [place]  Referring Provider [place]       Encounter Date: 06/27/2021  Check In:  Session Check In - 06/27/21 1422       Check-In   Supervising physician immediately available to respond to emergencies [facility] immediately available    Physician(s) Dr. Manuella Ghazi    Location [facility]    Staff Present Rosebud Poles, RN, BSN;Lisa Ysidro Evert, Cathleen Fears, MS, ACSM-CEP, Exercise Physiologist    Virtual Visit No    Medication changes reported     No    Fall or balance concerns reported    No    Tobacco Cessation No Change    Warm-up and Cool-down Performed as group-led instruction    Resistance Training Performed Yes    VAD Patient? No    PAD/SET Patient? No      Pain Assessment   Currently in Pain? No/denies    Multiple Pain Sites No             Capillary Blood Glucose: No results found for this or any previous visit (from the past 24 hour(s)).    Social History   Tobacco Use  Smoking Status Former   Packs/day: 1.00   Years: 45.00   Pack years: 45.00   Types: Cigarettes   Start date: 1974   Quit date: 06/01/2019   Years since quitting: 2.0  Smokeless Tobacco Never    Goals Met:  Proper associated with RPD/PD & O2 Sat Exercise tolerated well No report of concerns or symptoms today Strength training completed today  Goals Unmet:  Not Applicable  Comments: Service time is from 1325 to [place]    Dr. Rodman Pickle is Medical Director for Pulmonary Rehab at [facility].

## 2021-07-02 ENCOUNTER — Encounter (HOSPITAL_COMMUNITY)
Admission: RE | Admit: 2021-07-02 | Discharge: 2021-07-02 | Disposition: A | Discharge status: Home / Self Care | Payer: Medicare Other | Source: Ambulatory Visit | Attending: Emergency Medicine | Admitting: Emergency Medicine

## 2021-07-02 DIAGNOSIS — J449 Chronic obstructive pulmonary disease, unspecified: Secondary | ICD-10-CM

## 2021-07-02 NOTE — Progress Notes (Signed)
Daily Session Note  Patient Details  Name: ERCIL CASSIS MRN: [id number] Date of Birth: Jun 12, 1955 Referring Provider:   April Manson Pulmonary Rehab Walk Test from 05/22/2021 in [place]  Referring Provider [place]       Encounter Date: 07/02/2021  Check In:  Session Check In - 07/02/21 1420       Check-In   Supervising physician immediately available to respond to emergencies [facility] immediately available    Physician(s) Dr. Tawanna Solo    Location [facility]    Staff Present Rosebud Poles, RN, BSN;Lisa Ysidro Evert, RN;Olinty Celesta Aver, MS, ACSM CEP, Exercise Physiologist;Kaylee Rosana Hoes, MS, ACSM-CEP, Exercise Physiologist    Virtual Visit No    Medication changes reported     No    Fall or balance concerns reported    No    Tobacco Cessation No Change    Warm-up and Cool-down Performed as group-led instruction    Resistance Training Performed Yes    VAD Patient? No    PAD/SET Patient? No      Pain Assessment   Currently in Pain? No/denies    Multiple Pain Sites No             Capillary Blood Glucose: No results found for this or any previous visit (from the past 24 hour(s)).    Social History   Tobacco Use  Smoking Status Former   Packs/day: 1.00   Years: 45.00   Pack years: 45.00   Types: Cigarettes   Start date: 1974   Quit date: 06/01/2019   Years since quitting: 2.0  Smokeless Tobacco Never    Goals Met:  Proper associated with RPD/PD & O2 Sat Exercise tolerated well No report of concerns or symptoms today Strength training completed today  Goals Unmet:  Not Applicable  Comments: Service time is from 1319 to [place]    Dr. Rodman Pickle is Medical Director for Pulmonary Rehab at [facility].

## 2021-07-02 NOTE — Progress Notes (Deleted)
Daily Session Note  Patient Details  Name: [name] MRN: [id number] Date of Birth: 10-17-1955 Referring Provider:   April Manson Pulmonary Rehab Walk Test from 05/22/2021 in [place]  Referring Provider [place]       Encounter Date: 06/27/2021  Check In:   Capillary Blood Glucose: No results found for this or any previous visit (from the past 24 hour(s)).   Exercise Prescription Changes - 07/02/21 1400       Response to Exercise   Blood Pressure (Admit) 120/82    Blood Pressure (Exercise) 148/82    Blood Pressure (Exit) 132/70    Heart Rate (Admit) 66 bpm    Heart Rate (Exercise) 85 bpm    Heart Rate (Exit) 78 bpm    Oxygen Saturation (Admit) 97 %    Oxygen Saturation (Exercise) 96 %    Oxygen Saturation (Exit) 97 %    Rating of Perceived Exertion (Exercise) 13    Perceived Dyspnea (Exercise) 1    Duration Progress to 30 minutes of  aerobic without signs/symptoms of physical distress    Intensity THRR unchanged      Progression   Progression Continue to progress workloads to maintain intensity without signs/symptoms of physical distress.      Resistance Training   Training Prescription Yes    Weight red bands    Reps 10-15    Time 10 Minutes      Oxygen   Oxygen Continuous    Liters 2      Arm Ergometer   Level 1.5    Minutes 15    METs 2.2      Track   Laps 7    Minutes 15    METs 1.81             Social History   Tobacco Use  Smoking Status Former   Packs/day: 1.00   Years: 45.00   Pack years: 45.00   Types: Cigarettes   Start date: 66   Quit date: 06/01/2019   Years since quitting: 2.0  Smokeless Tobacco Never    Goals Met:  Proper associated with RPD/PD & O2 Sat Exercise tolerated well No report of concerns or symptoms today Strength training completed today  Goals Unmet:  Not Applicable  Comments: Service time is from 1319 to [place]    Dr. Rodman Pickle is Medical Director for  Pulmonary Rehab at [facility].

## 2021-07-04 ENCOUNTER — Encounter (HOSPITAL_COMMUNITY)
Admission: RE | Admit: 2021-07-04 | Discharge: 2021-07-04 | Disposition: A | Discharge status: Home / Self Care | Payer: Medicare Other | Source: Ambulatory Visit | Attending: Emergency Medicine | Admitting: Emergency Medicine

## 2021-07-04 DIAGNOSIS — J449 Chronic obstructive pulmonary disease, unspecified: Secondary | ICD-10-CM | POA: Diagnosis present

## 2021-07-04 NOTE — Progress Notes (Signed)
Daily Session Note  Patient Details  Name: [name] MRN: [id number] Date of Birth: [birth year] Referring Provider:   April Manson Pulmonary Rehab Walk Test from 05/22/2021 in [place]  Referring Provider [place]       Encounter Date: 07/04/2021  Check In:  Session Check In - 07/04/21 1429       Check-In   Supervising physician immediately available to respond to emergencies [facility] immediately available    Physician(s) Dr. Karleen [place]    Location [facility]    Staff Present Rosebud Poles, RN, BSN;Lisa Ysidro Evert, Cathleen Fears, MS, ACSM-CEP, Exercise Physiologist    Virtual Visit No    Medication changes reported     No    Fall or balance concerns reported    No    Tobacco Cessation No Change    Warm-up and Cool-down Performed as group-led instruction    Resistance Training Performed Yes    VAD Patient? No    PAD/SET Patient? No      Pain Assessment   Currently in Pain? No/denies    Multiple Pain Sites No             Capillary Blood Glucose: No results found for this or any previous visit (from the past 24 hour(s)).    Social History   Tobacco Use  Smoking Status Former   Packs/day: 1.00   Years: 45.00   Pack years: 45.00   Types: Cigarettes   Start date: 1974   Quit date: 06/01/2019   Years since quitting: 2.0  Smokeless Tobacco Never    Goals Met:  Proper associated with RPD/PD & O2 Sat Exercise tolerated well No report of concerns or symptoms today Strength training completed today  Goals Unmet:  Not Applicable  Comments: Service time is from 1323 to 1448.    Dr. Rodman Pickle is Medical Director for Pulmonary Rehab at [facility]

## 2021-07-09 ENCOUNTER — Ambulatory Visit: Payer: Medicare Other | Admitting: Emergency Medicine

## 2021-07-09 ENCOUNTER — Encounter (HOSPITAL_COMMUNITY)
Admission: RE | Admit: 2021-07-09 | Discharge: 2021-07-09 | Disposition: A | Discharge status: Home / Self Care | Payer: Medicare Other | Source: Ambulatory Visit | Attending: Emergency Medicine | Admitting: Emergency Medicine

## 2021-07-09 DIAGNOSIS — J449 Chronic obstructive pulmonary disease, unspecified: Secondary | ICD-10-CM

## 2021-07-09 NOTE — Progress Notes (Addendum)
Daily Session Note  Patient Details  Name: [name] MRN: [id number] Date of Birth: January 21, 1956 Referring Provider:   April Norman Pulmonary Rehab Walk Test from 05/22/2021 in [place]  Referring Provider [name]       Encounter Date: 07/09/2021  Check In:  Session Check In - 07/09/21 1359       Check-In   Supervising physician immediately available to respond to emergencies [name] immediately available    Physician(s) [name]    Location [facility]    Staff Present Terry Poles, RN, BSN;[name] [name], Terry Fears, MS, ACSM-CEP, Exercise Physiologist    Virtual Visit No    Medication changes reported     No    Fall or balance concerns reported    No    Tobacco Cessation No Change    Warm-up and Cool-down Performed as group-led instruction    Resistance Training Performed Yes    VAD Patient? No    PAD/SET Patient? No      Pain Assessment   Currently in Pain? No/denies    Multiple Pain Sites No             Capillary Blood Glucose: No results found for this or any previous visit (from the past 24 hour(s)).    Social History   Tobacco Use  Smoking Status Former   Packs/day: 1.00   Years: 45.00   Pack years: 45.00   Types: Cigarettes   Start date: 1974   Quit date: 06/01/2019   Years since quitting: 2.1  Smokeless Tobacco Never    Goals Met:  Exercise tolerated well No report of concerns or symptoms today Strength training completed today  Goals Unmet:  O2 Sat  On the track [name] dropped his oxygen saturation to 86%. Oxygen increased to 3 liters saturation improved to 92%.  Comments: Service time is from 1311 to 73   [name] is Medical Director for Pulmonary Rehab at [facility].

## 2021-07-10 ENCOUNTER — Ambulatory Visit (INDEPENDENT_AMBULATORY_CARE_PROVIDER_SITE_OTHER): Payer: Medicare Other

## 2021-07-10 ENCOUNTER — Ambulatory Visit (INDEPENDENT_AMBULATORY_CARE_PROVIDER_SITE_OTHER): Payer: Medicare Other | Admitting: Nurse Practitioner

## 2021-07-10 ENCOUNTER — Encounter: Payer: Medicare Other | Admitting: Student

## 2021-07-10 ENCOUNTER — Encounter: Payer: Self-pay | Admitting: Nurse Practitioner

## 2021-07-10 ENCOUNTER — Ambulatory Visit: Payer: Medicare Other

## 2021-07-10 ENCOUNTER — Encounter: Payer: Self-pay | Admitting: Student

## 2021-07-10 VITALS — BP 112/68 | HR 60 | Temp 98.1°F | Ht 69.0 in | Wt 170.2 lb

## 2021-07-10 DIAGNOSIS — R079 Chest pain, unspecified: Secondary | ICD-10-CM | POA: Diagnosis not present

## 2021-07-10 DIAGNOSIS — R911 Solitary pulmonary nodule: Secondary | ICD-10-CM

## 2021-07-10 DIAGNOSIS — R918 Other nonspecific abnormal finding of lung field: Secondary | ICD-10-CM | POA: Insufficient documentation

## 2021-07-10 DIAGNOSIS — J441 Chronic obstructive pulmonary disease with (acute) exacerbation: Secondary | ICD-10-CM | POA: Diagnosis not present

## 2021-07-10 DIAGNOSIS — J432 Centrilobular emphysema: Secondary | ICD-10-CM | POA: Diagnosis not present

## 2021-07-10 DIAGNOSIS — J9611 Chronic respiratory failure with hypoxia: Secondary | ICD-10-CM

## 2021-07-10 DIAGNOSIS — K219 Gastro-esophageal reflux disease without esophagitis: Secondary | ICD-10-CM

## 2021-07-10 MED ORDER — PANTOPRAZOLE SODIUM 40 MG PO TBEC: 40.0000 mg | DELAYED_RELEASE_TABLET | Freq: Every day | ORAL | 5 refills | Status: DC

## 2021-07-10 NOTE — Assessment & Plan Note (Signed)
Chest discomfort after eating. Question uncontrolled GERD. Recommended change in PPI therapy to Protonix. Could consider Twice daily dosing if he is still having breakthrough symptoms.

## 2021-07-10 NOTE — Patient Instructions (Addendum)
Continue Trelegy 1 puff daily. Brush tongue and rinse mouth afterwards Continue Albuterol inhaler 2 puffs or 3 mL neb every 6 hours as needed for shortness of breath or wheezing. Notify if symptoms persist despite rescue inhaler/neb use. Continue flonase nasal spray 2 sprays each nostril daily Continue supplemental oxygen 2 lpm for goal oxygen level >88-90% Continue guaifenesin 10 mL every 6 hours as needed for chest congestion Continue supplemental oxygen 2 lpm continuous until you have your POC fixed. Goal oxygen >88-90%  Continue rotating antibiotics for you to use during the first week of alternating months: -Cefuroxime 250 mg twice a day for 7 days (February, May, August, November) -Azithromycin 250 mg once daily for 5 days (March, June, September, December) -Doxycycline 100 mg twice a day for 7 days (April, July, October, January)  Stop omeprazole. Start protonix 40 mg Twice daily  Increase prednisone to 20 mg for a week then decrease to daily 10 mg dose.  Please seek emergency care if you develop chest pain that does not resolve  Ambulatory referral to cardiology.   Follow up in two weeks with Dr. Lamonte Sakai or Alanson Aly. If symptoms do not improve or worsen, please contact office for sooner follow up or seek emergency care.

## 2021-07-10 NOTE — Assessment & Plan Note (Addendum)
>>  ASSESSMENT AND PLAN FOR CHRONIC RESPIRATORY FAILURE WITH HYPOXIA (HCC) WRITTEN ON 07/10/2021  5:37 PM BY Seaton Hofmann, Ruby Cola, NP  Advised he contact [employer] to troubleshoot his POC. It does not seem to be working appropriately. Use continuous portable tanks in interim. Walking oximetry with sats maintaining in the 90's on 2 lpm. Goal oxygen >88-90%.   >>ASSESSMENT AND PLAN FOR MULTIPLE PULMONARY NODULES WRITTEN ON 07/10/2021  5:44 PM BY Myrla Malanowski V, NP  Recent CT scan with resolution of right lung nodule.  There was a new 12 x 4 x 8 mm left lower lobe nodule.  Imaging reviewed by Dr. Delton Coombes and suspected to possibly be inflammatory.  We will plan for repeat CT chest in 3 months to ensure stability.  If he continues to have worsening respiratory symptoms, may need to repeat sooner.
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
## 2021-07-10 NOTE — Assessment & Plan Note (Addendum)
>>  ASSESSMENT AND PLAN FOR COPD WITH EMPHYSEMA (HCC) WRITTEN ON 07/10/2021  5:35 PM BY Amanat Hackel V, NP  Severe COPD with high symptom burden. Question whether this is a mild exacerbation. Advised trial of increased prednisone to 20 mg for 1 week to see if his chest tightness improves as he does occasionally feel like the albuterol helps. Continue on his aggressive maintenance regimen with triple therapy, rotating antibiotics and chronic steroids. Close follow up.  >>ASSESSMENT AND PLAN FOR CHRONIC RESPIRATORY FAILURE WITH HYPOXIA (HCC) WRITTEN ON 11/05/2022  4:57 PM BY Siri Buege V, NP  >>ASSESSMENT AND PLAN FOR CHRONIC RESPIRATORY FAILURE WITH HYPOXIA (HCC) WRITTEN ON 07/10/2021  5:37 PM BY Jeffry Vogelsang V, NP  Advised he contact [facility] to troubleshoot his POC. It does not seem to be working appropriately. Use continuous portable tanks in interim. Walking oximetry with sats maintaining in the 90's on 2 lpm. Goal oxygen >88-90%.   >>ASSESSMENT AND PLAN FOR MULTIPLE PULMONARY NODULES WRITTEN ON 07/10/2021  5:44 PM BY Raistlin Gum V, NP  Recent CT scan with resolution of right lung nodule.  There was a new 12 x 4 x 8 mm left lower lobe nodule.  Imaging reviewed by Dr. Delton Coombes and suspected to possibly be inflammatory.  We will plan for repeat CT chest in 3 months to ensure stability.  If he continues to have worsening respiratory symptoms, may need to repeat sooner.

## 2021-07-10 NOTE — Assessment & Plan Note (Addendum)
Question stable angina. EKG did not show any ACS today and CXR without acute process. Recommended he seek emergency care if symptoms return and do not resolve with rest. Referral placed to cardiology. Could also be having a mild AECOPD. He does seem to have some GERD symptoms, consistent with his chest discomfort after eating; however, this would not be in line with exertional chest pain.   Patient Instructions  Continue Trelegy 1 puff daily. Brush tongue and rinse mouth afterwards Continue Albuterol inhaler 2 puffs or 3 mL neb every 6 hours as needed for shortness of breath or wheezing. Notify if symptoms persist despite rescue inhaler/neb use. Continue flonase nasal spray 2 sprays each nostril daily Continue supplemental oxygen 2 lpm for goal oxygen level >88-90% Continue guaifenesin 10 mL every 6 hours as needed for chest congestion Continue supplemental oxygen 2 lpm continuous until you have your POC fixed. Goal oxygen >88-90%  Continue rotating antibiotics for you to use during the first week of alternating months: -Cefuroxime 250 mg twice a day for 7 days (February, May, August, November) -Azithromycin 250 mg once daily for 5 days (March, June, September, December) -Doxycycline 100 mg twice a day for 7 days (April, July, October, January)  Stop omeprazole. Start protonix 40 mg Twice daily  Increase prednisone to 20 mg for a week then decrease to daily 10 mg dose.  Please seek emergency care if you develop chest pain that does not resolve  Ambulatory referral to cardiology.   Follow up in two weeks with Dr. Lamonte Sakai or Alanson Aly. If symptoms do not improve or worsen, please contact office for sooner follow up or seek emergency care.

## 2021-07-10 NOTE — Assessment & Plan Note (Signed)
Recent CT scan with resolution of right lung nodule.  There was a new 12 x 4 x 8 mm left lower lobe nodule.  Imaging reviewed by Dr. Lamonte Sakai and suspected to possibly be inflammatory.  We will plan for repeat CT chest in 3 months to ensure stability.  If he continues to have worsening respiratory symptoms, may need to repeat sooner.

## 2021-07-11 ENCOUNTER — Encounter (HOSPITAL_COMMUNITY)
Admission: RE | Admit: 2021-07-11 | Discharge: 2021-07-11 | Disposition: A | Discharge status: Home / Self Care | Payer: Medicare Other | Source: Ambulatory Visit | Attending: Emergency Medicine | Admitting: Emergency Medicine

## 2021-07-11 DIAGNOSIS — J449 Chronic obstructive pulmonary disease, unspecified: Secondary | ICD-10-CM

## 2021-07-11 NOTE — Progress Notes (Signed)
Daily Session Note  Patient Details  Name: [name] MRN: [id number] Date of Birth: 11/29/1955 Referring Provider:   April Manson Pulmonary Rehab Walk Test from 05/22/2021 in [place]  Referring Provider [place]       Encounter Date: 07/11/2021  Check In:  Session Check In - 07/11/21 1420       Check-In   Supervising physician immediately available to respond to emergencies [facility] immediately available    Physician(s) Dr. Cyndia Skeeters    Location [facility]    Staff Present Rosebud Poles, RN, BSN;Jeily Guthridge Ysidro Evert, Cathleen Fears, MS, ACSM-CEP, Exercise Physiologist    Virtual Visit No    Medication changes reported     No    Fall or balance concerns reported    No    Tobacco Cessation No Change    Warm-up and Cool-down Performed as group-led instruction    Resistance Training Performed Yes    VAD Patient? No    PAD/SET Patient? No      Pain Assessment   Currently in Pain? No/denies    Multiple Pain Sites No             Capillary Blood Glucose: No results found for this or any previous visit (from the past 24 hour(s)).    Social History   Tobacco Use  Smoking Status Former   Packs/day: 1.00   Years: 45.00   Total pack years: 45.00   Types: Cigarettes   Start date: 45   Quit date: 06/01/2019   Years since quitting: 2.1  Smokeless Tobacco Never    Goals Met:  Exercise tolerated well No report of concerns or symptoms today Strength training completed today  Goals Unmet:  Not Applicable  Comments: Service time is from 1320 to [place]    Dr. Rodman Pickle is Medical Director for Pulmonary Rehab at [facility].

## 2021-07-16 ENCOUNTER — Encounter (HOSPITAL_COMMUNITY)
Admission: RE | Admit: 2021-07-16 | Discharge: 2021-07-16 | Disposition: A | Discharge status: Home / Self Care | Payer: Medicare Other | Source: Ambulatory Visit | Attending: Emergency Medicine | Admitting: Emergency Medicine

## 2021-07-16 VITALS — Wt 167.1 lb

## 2021-07-16 DIAGNOSIS — J449 Chronic obstructive pulmonary disease, unspecified: Secondary | ICD-10-CM

## 2021-07-16 NOTE — Progress Notes (Signed)
Daily Session Note  Patient Details  Name: [name] MRN: [id number] Date of Birth: 03/11/1955 Referring Provider:   April Manson Pulmonary Rehab Walk Test from 05/22/2021 in [place]  Referring Provider [place]       Encounter Date: 07/16/2021  Check In:  Session Check In - 07/16/21 1419       Check-In   Supervising physician immediately available to respond to emergencies [facility] immediately available    Physician(s) Dr. Grandville Silos    Location [facility]    Staff Present Rosebud Poles, RN, BSN;Lisa Ysidro Evert, Cathleen Fears, MS, ACSM-CEP, Exercise Physiologist    Virtual Visit No    Medication changes reported     No    Fall or balance concerns reported    No    Tobacco Cessation No Change    Warm-up and Cool-down Performed as group-led instruction    Resistance Training Performed Yes    VAD Patient? No    PAD/SET Patient? No      Pain Assessment   Currently in Pain? No/denies    Multiple Pain Sites No             Capillary Blood Glucose: No results found for this or any previous visit (from the past 24 hour(s)).   Exercise Prescription Changes - 07/16/21 1500       Response to Exercise   Blood Pressure (Admit) 108/60    Blood Pressure (Exercise) 136/80    Blood Pressure (Exit) 130/76    Heart Rate (Admit) 76 bpm    Heart Rate (Exercise) 89 bpm    Heart Rate (Exit) 67 bpm    Oxygen Saturation (Admit) 92 %    Oxygen Saturation (Exercise) 88 %    Oxygen Saturation (Exit) 93 %    Rating of Perceived Exertion (Exercise) 11    Perceived Dyspnea (Exercise) 1    Duration Progress to 30 minutes of  aerobic without signs/symptoms of physical distress    Intensity THRR unchanged      Progression   Progression Continue to progress workloads to maintain intensity without signs/symptoms of physical distress.      Resistance Training   Training Prescription Yes    Weight red bands    Reps 10-15     Time 10 Minutes      Oxygen   Oxygen Continuous    Liters 2      Arm Ergometer   Level 2    Minutes 15    METs 2.3      Track   Laps 4    Minutes 15    METs 1.46             Social History   Tobacco Use  Smoking Status Former   Packs/day: 1.00   Years: 45.00   Total pack years: 45.00   Types: Cigarettes   Start date: 8   Quit date: 06/01/2019   Years since quitting: 2.1  Smokeless Tobacco Never    Goals Met:  Proper associated with RPD/PD & O2 Sat Exercise tolerated well No report of concerns or symptoms today Strength training completed today  Goals Unmet:  Not Applicable  Comments: Service time is from  1324 to 1430    Dr. Rodman Pickle is Medical Director for Pulmonary Rehab at [facility].

## 2021-07-18 ENCOUNTER — Encounter (HOSPITAL_COMMUNITY)
Admission: RE | Admit: 2021-07-18 | Discharge: 2021-07-18 | Disposition: A | Discharge status: Home / Self Care | Payer: Medicare Other | Source: Ambulatory Visit | Attending: Emergency Medicine | Admitting: Emergency Medicine

## 2021-07-18 DIAGNOSIS — J449 Chronic obstructive pulmonary disease, unspecified: Secondary | ICD-10-CM

## 2021-07-18 NOTE — Progress Notes (Signed)
Daily Session Note  Patient Details  Name: SALIL RAINERI MRN: [id number] Date of Birth: [birth year] Referring Provider:   April Manson Pulmonary Rehab Walk Test from 05/22/2021 in [place]  Referring Provider [place]       Encounter Date: 07/18/2021  Check In:  Session Check In - 07/18/21 1432       Check-In   Supervising physician immediately available to respond to emergencies [facility] immediately available    Physician(s) Dr. Cyndia Skeeters    Location [facility]    Staff Present Rodney Langton, Cathleen Fears, MS, ACSM-CEP, Exercise Physiologist;Jetta Gilford Rile BS, ACSM EP-C, Exercise Physiologist    Virtual Visit No    Medication changes reported     No    Fall or balance concerns reported    No    Tobacco Cessation No Change    Warm-up and Cool-down Performed as group-led instruction    Resistance Training Performed Yes    VAD Patient? No    PAD/SET Patient? No      Pain Assessment   Currently in Pain? No/denies    Multiple Pain Sites No             Capillary Blood Glucose: No results found for this or any previous visit (from the past 24 hour(s)).    Social History   Tobacco Use  Smoking Status Former   Packs/day: 1.00   Years: 45.00   Total pack years: 45.00   Types: Cigarettes   Start date: 18   Quit date: 06/01/2019   Years since quitting: 2.1  Smokeless Tobacco Never    Goals Met:  Exercise tolerated well No report of concerns or symptoms today Strength training completed today  Goals Unmet:  Not Applicable  Comments: Service time is from 1315 to 1445. Completed post 6 MWT.    Dr. Rodman Pickle is Medical Director for Pulmonary Rehab at [facility].

## 2021-07-22 ENCOUNTER — Ambulatory Visit: Payer: Medicare Other | Admitting: Internal Medicine

## 2021-07-23 ENCOUNTER — Encounter (HOSPITAL_COMMUNITY)
Admission: RE | Admit: 2021-07-23 | Discharge: 2021-07-23 | Disposition: A | Discharge status: Home / Self Care | Payer: Medicare Other | Source: Ambulatory Visit | Attending: Emergency Medicine | Admitting: Emergency Medicine

## 2021-07-23 DIAGNOSIS — J449 Chronic obstructive pulmonary disease, unspecified: Secondary | ICD-10-CM | POA: Diagnosis not present

## 2021-07-23 NOTE — Progress Notes (Signed)
Daily Session Note  Patient Details  Name: SLADEN PLANCARTE MRN: [id number] Date of Birth: [birth year] Referring Provider:   April Manson Pulmonary Rehab Walk Test from 05/22/2021 in [place]  Referring Provider [place]       Encounter Date: 07/23/2021  Check In:  Session Check In - 07/23/21 1429       Check-In   Supervising physician immediately available to respond to emergencies [facility] immediately available    Physician(s) Dr. Karleen Hampshire    Location [facility]    Staff Present Rosebud Poles, RN, BSN;Lisa Ysidro Evert, Cathleen Fears, MS, ACSM-CEP, Exercise Physiologist    Virtual Visit No    Medication changes reported     No    Fall or balance concerns reported    No    Tobacco Cessation No Change    Warm-up and Cool-down Performed as group-led instruction    Resistance Training Performed Yes    VAD Patient? No    PAD/SET Patient? No      Pain Assessment   Currently in Pain? No/denies    Multiple Pain Sites No             Capillary Blood Glucose: No results found for this or any previous visit (from the past 24 hour(s)).    Social History   Tobacco Use  Smoking Status Former   Packs/day: 1.00   Years: 45.00   Total pack years: 45.00   Types: Cigarettes   Start date: 21   Quit date: 06/01/2019   Years since quitting: 2.1  Smokeless Tobacco Never    Goals Met:  Proper associated with RPD/PD & O2 Sat Exercise tolerated well No report of concerns or symptoms today Strength training completed today  Goals Unmet:  Not Applicable  Comments: Service time is from 1330 to 1445.    Dr. Rodman Pickle is Medical Director for Pulmonary Rehab at [facility].

## 2021-07-24 ENCOUNTER — Ambulatory Visit: Payer: Medicare Other | Admitting: Nurse Practitioner

## 2021-07-24 NOTE — Progress Notes (Signed)
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 prescription for aerobic and resistive training based on initial evaluation findings, risk stratification, comorbidities and participant's personal goals.    Expected Outcomes Short Term: Increase workloads from initial exercise prescription for resistance, speed, and METs.;Short Term: Perform resistance training exercises routinely during rehab and add in resistance training at home;Long Term: Improve cardiorespiratory fitness, muscular endurance and strength as measured by increased METs and functional capacity (6MWT) Short Term: Increase workloads from initial exercise prescription for resistance, speed, and METs.;Short Term: Perform resistance training exercises routinely during rehab and add in resistance  training at home;Long Term: Improve cardiorespiratory fitness, muscular endurance and strength as measured by increased METs and functional capacity (6MWT) Short Term: Increase workloads from initial exercise prescription for resistance, speed, and METs.;Short Term: Perform resistance training exercises routinely during rehab and add in resistance training at home;Long Term: Improve cardiorespiratory fitness, muscular endurance and strength as measured by increased METs and functional capacity (6MWT) Short Term: Increase workloads from initial exercise prescription for resistance, speed, and METs.;Short Term: Perform resistance training exercises routinely during rehab and add in resistance training at home;Long Term: Improve cardiorespiratory fitness, muscular endurance and strength as measured by increased METs and functional capacity (6MWT)    Able to understand and use rate of perceived exertion (RPE) scale Yes Yes Yes Yes    Intervention  Provide education and explanation on how to use RPE scale Provide education and explanation on how to use RPE scale Provide education and explanation on how to use RPE scale Provide education and explanation on how to use RPE scale    Expected Outcomes Short Term: Able to use RPE daily in rehab to express subjective intensity level;Long Term:  Able to use RPE to guide intensity level when exercising independently Short Term: Able to use RPE daily in rehab to express subjective intensity level;Long Term:  Able to use RPE to guide intensity level when exercising independently Short Term: Able to use RPE daily in rehab to express subjective intensity level;Long Term:  Able to use RPE to guide intensity level when exercising independently Short Term: Able to use RPE daily in rehab to express subjective intensity level;Long Term:  Able to use RPE to guide intensity level when exercising independently    Able to understand and use Dyspnea scale Yes Yes Yes Yes    Intervention  Provide education and explanation on how to use Dyspnea scale Provide education and explanation on how to use Dyspnea scale Provide education and explanation on how to use Dyspnea scale Provide education and explanation on how to use Dyspnea scale    Expected Outcomes Short Term: Able to use Dyspnea scale daily in rehab to express subjective sense of shortness of breath during exertion;Long Term: Able to use Dyspnea scale to guide intensity level when exercising independently Short Term: Able to use Dyspnea scale daily in rehab to express subjective sense of shortness of breath during exertion;Long Term: Able to use Dyspnea scale to guide intensity level when exercising independently Short Term: Able to use Dyspnea scale daily in rehab to express subjective sense of shortness of breath during exertion;Long Term: Able to use Dyspnea scale to guide intensity level when exercising independently Short Term: Able to use Dyspnea scale daily in rehab to express subjective sense of shortness of breath during exertion;Long Term: Able to use Dyspnea scale to guide intensity level when exercising independently    Knowledge and understanding of Target Heart Rate Range (THRR) Yes Yes Yes Yes    Intervention Provide education and explanation of THRR including how the numbers were predicted and where they are located for reference Provide education and explanation of THRR including how the numbers were predicted and where they are located for reference Provide education and explanation of THRR including how the numbers were predicted and where they are located for reference Provide education and explanation of THRR including how the numbers were predicted and where they are located for reference    Expected Outcomes Short Term: Able to state/look up THRR;Long Term: Able to use THRR to govern intensity when exercising independently;Short Term: Able to use daily as guideline for intensity in rehab Short Term: Able to state/look  up THRR;Long Term: Able to use THRR to govern intensity when exercising independently;Short Term: Able to use daily as guideline for intensity in rehab Short Term: Able to state/look up THRR;Long Term: Able to use THRR to govern intensity when exercising independently;Short Term: Able to use daily as guideline for intensity in rehab Short Term: Able to state/look up THRR;Long Term: Able to use THRR to govern intensity when exercising independently;Short Term: Able to use daily as guideline for intensity in rehab    Understanding of Exercise Prescription Yes Yes Yes Yes    Intervention Provide education, explanation, and written materials on patient's individual exercise prescription Provide education, explanation, and written materials on  patient's individual exercise prescription Provide education, explanation, and written materials on patient's individual exercise prescription Provide education, explanation, and written materials on patient's individual exercise prescription    Expected Outcomes Short Term: Able to explain program exercise prescription;Long Term: Able to explain home exercise prescription to exercise independently Short Term: Able to explain program exercise prescription;Long Term: Able to explain home exercise prescription to exercise independently Short Term: Able to explain program exercise prescription;Long Term: Able to explain home exercise prescription to exercise independently Short Term: Able to explain program exercise prescription;Long Term: Able to explain home exercise prescription to exercise independently             Exercise Goals Re-Evaluation :  Exercise Goals Re-Evaluation     Row Name 05/28/21 0813 06/19/21 1152 07/16/21 0747         Exercise Goal Re-Evaluation   Exercise Goals Review Increase Physical Activity;Increase Strength and Stamina;Able to understand and use rate of perceived exertion (RPE) scale;Able to understand and use Dyspnea scale;Knowledge and  understanding of Target Heart Rate Range (THRR);Understanding of Exercise Prescription Increase Physical Activity;Increase Strength and Stamina;Able to understand and use rate of perceived exertion (RPE) scale;Able to understand and use Dyspnea scale;Knowledge and understanding of Target Heart Rate Range (THRR);Understanding of Exercise Prescription Increase Physical Activity;Increase Strength and Stamina;Able to understand and use rate of perceived exertion (RPE) scale;Able to understand and use Dyspnea scale;Knowledge and understanding of Target Heart Rate Range (THRR);Understanding of Exercise Prescription     Comments Jagar is scheduled to begin exercise this week. Will continue to monitor and progress as able. Baudelio has completed 2 exercise sessions. He exercises for 15 min on the arm ergometer and track. He averages 2.2 METs at level 1 on the arm ergometer and 1.93 METs on the track. He performs the warmup and cooldown standing without limitations. He has missed several sessions for appointments and personal health reasons. It has been difficult to progress him because of his absences. Will continue to monitor and progress as able. Rayder has completed 9 exercise sessions. He exercises for 15 min on the arm ergometer and track. He averages 2.2 METs at level 1.5 on the arm ergometer and 2.28 METs on the track. He performs the warmup and cooldown standing without limitations. Frederich has increased his workload on the arm ergometer, although METs have remained the same. He is very limited by his lung condition and back pain. I think this causes him to doubt himself in progressing. Will continue to monitor and progress as able.     Expected Outcomes Through exercise at rehab and home the patient will decrease shortness of breath with daily activities and feel confident in carrying out an exercise regimn at home. Through exercise at rehab and home the patient will decrease shortness of breath with daily  activities and feel confident in carrying out an exercise regimn at home. Through exercise at rehab and home the patient will decrease shortness of breath with daily activities and feel confident in carrying out an exercise regimn at home.              Discharge Exercise Prescription (Final Exercise Prescription Changes):  Exercise Prescription Changes - 07/16/21 1500       Response to Exercise   Blood Pressure (Admit) 108/60    Blood Pressure (Exercise) 136/80    Blood Pressure (Exit) 130/76    Heart Rate (Admit) 76 bpm    Heart Rate (Exercise) 89 bpm    Heart Rate (Exit) 67 bpm  Oxygen Saturation (Admit) 92 %    Oxygen Saturation (Exercise) 88 %    Oxygen Saturation (Exit) 93 %    Rating of Perceived Exertion (Exercise) 11    Perceived Dyspnea (Exercise) 1    Duration Progress to 30 minutes of  aerobic without signs/symptoms of physical distress    Intensity THRR unchanged      Progression   Progression Continue to progress workloads to maintain intensity without signs/symptoms of physical distress.      Resistance Training   Training Prescription Yes    Weight red bands    Reps 10-15    Time 10 Minutes      Oxygen   Oxygen Continuous    Liters 2      Arm Ergometer   Level 2    Minutes 15    METs 2.3      Track   Laps 4    Minutes 15    METs 1.46             Nutrition:  Target Goals: Understanding of nutrition guidelines, daily intake of sodium <1527m, cholesterol <2042m calories 30% from fat and 7% or less from saturated fats, daily to have 5 or more servings of fruits and vegetables.  Biometrics:   Post Biometrics - 05/22/21 0826        Post  Biometrics   Height _0  (1.753 m)    BMI (Calculated) 24.21    Grip Strength 29 kg             Nutrition Therapy Plan and Nutrition Goals:  Nutrition Therapy & Goals - 07/23/21 1449       Nutrition Therapy   Diet Heart Healthy Diet      Personal Nutrition Goals   Nutrition Goal Patient  to build a healthy plate to include lean protein/plant protein, fruits, vegetables, whole grains, and low fat dairy as part of a heart healthy meal plan.   in progress   Personal Goal #2 Patient to implement >2-5 vegetables servings daily and >1 fruit serving per day.   in progress   Personal Goal #3 Patient to monitor hydration/fluid intake; aim for ~64oz of fluids daily.   in progress   Personal Goal #4 Patient to implement Nutrition supplements as needed to support calorie needs and decrease frequency of meal skipping.   in progress   Comments Patient enjoys cooking; he does report some high calorie food choices and cooking methods. This is likely helping with maintaining caloric needs.      Intervention Plan   Intervention Nutrition handout(s) given to patient.;Prescribe, educate and counsel regarding individualized specific dietary modifications aiming towards targeted core components such as weight, hypertension, lipid management, diabetes, heart failure and other comorbidities.   Gave handouts on convenience foods and the plate method   Expected Outcomes Long Term Goal: Adherence to prescribed nutrition plan.             Nutrition Assessments:  Nutrition Assessments - 07/23/21 1443       Rate Your Plate Scores   Post Score 35   patient chooses many high calorie food items due high calorie needs (butter, ice cream, whole milk, etc)           MEDIFICTS Score Key: ?70 Need to make dietary changes  40-70 Heart Healthy Diet ? 40 Therapeutic Level Cholesterol Diet  Flowsheet Row PULMONARY REHAB CHRONIC OBSTRUCTIVE PULMONARY DISEASE [facility] [place] [facility]  Score on Discharge 35      Picture Your Plate Scores: <36 Unhealthy dietary pattern with much room for improvement. 41-50 Dietary pattern unlikely to meet recommendations for good health and room for improvement. 51-60 More healthful dietary pattern, with some  room for improvement.  >60 Healthy dietary pattern, although there may be some specific behaviors that could be improved.    Nutrition Goals Re-Evaluation:  Nutrition Goals Re-Evaluation     [place] Name 07/23/21 1449             Goals   Current Weight 165 lb 12.6 oz (75.2 kg)                Nutrition Goals Discharge (Final Nutrition Goals Re-Evaluation):  Nutrition Goals Re-Evaluation - 07/23/21 1449       Goals   Current Weight 165 lb 12.6 oz (75.2 kg)             Psychosocial: Target Goals: Acknowledge presence or absence of significant depression and/or stress, maximize coping skills, provide positive support system. Participant is able to verbalize types and ability to use techniques and skills needed for reducing stress and depression.  Initial Review & Psychosocial Screening:  Initial Psych Review & Screening - 05/22/21 0920       Initial Review   Current issues with History of Depression;Current Depression;Current Anxiety/Panic;Current Sleep Concerns   Is in addicts support group. Is on antidepressants     Family Dynamics   Good Support System? Yes    Comments addicts support group      Barriers   Psychosocial barriers to participate in program The patient should benefit from training in stress management and relaxation.      Screening Interventions   Interventions Encouraged to exercise;To provide support and resources with identified psychosocial needs    Expected Outcomes Long Term Goal: Stressors or current issues are controlled or eliminated.;Long Term goal: The participant improves quality of Life and PHQ9 Scores as seen by post scores and/or verbalization of changes             Quality of Life Scores:  Scores of 19 and below usually indicate a poorer quality of life in these areas.  A difference of  2-3 points is a clinically meaningful difference.  A difference of 2-3 points in the total score of the Quality of Life Index has been associated  with significant improvement in overall quality of life, self-image, physical symptoms, and general health in studies assessing change in quality of life.  PHQ-9: Review Flowsheet  More data exists      07/18/2021 05/22/2021 04/29/2021 03/26/2021 12/24/2020  Depression screen PHQ 2/9  Decreased Interest 0 0 1 0 0  Down, Depressed, Hopeless 1 1 0 1 1  PHQ - 2 Score _0 Altered sleeping _1 - 3  Tired, decreased energy _2 - 0  Change in appetite 0 0 1 - 0  Feeling bad or failure about yourself  0 0 0 - 0  Trouble concentrating 0 1 0 - 0  Moving slowly or fidgety/restless 0 0 0 - 0  Suicidal thoughts 0 0 0 - 0  PHQ-9 Score _3 - 4  Difficult doing work/chores Somewhat difficult Very difficult Not difficult at all - Not difficult at all   Interpretation of Total Score  Total Score Depression Severity:  1-4 = Minimal depression, 5-9 = Mild depression, 10-14 = Moderate depression, 15-19 = Moderately severe  depression, 20-27 = Severe depression   Psychosocial Evaluation and Intervention:  Psychosocial Evaluation - 05/22/21 0922       Psychosocial Evaluation & Interventions   Interventions Stress management education;Relaxation education;Encouraged to exercise with the program and follow exercise prescription    Comments For patient to manage stress and anxiety in healthy ways.    Expected Outcomes QOL will improve as patient is able to learn to relax and reduces his stress.    Continue Psychosocial Services  Follow up required by staff             Psychosocial Re-Evaluation:  Psychosocial Re-Evaluation     [place] Name 05/28/21 [phone] 06/25/21 0910 07/16/21 0957         Psychosocial Re-Evaluation   Current issues with Current Depression;History of Depression;Current Anxiety/Panic;Current Sleep Concerns Current Depression;History of Depression;Current Sleep Concerns;Current Anxiety/Panic Current Depression;History of Depression;Current Anxiety/Panic     Comments Korry  has not started exercising in pulmonary rehab d/t MD appts, unable to assess psychosocial status until he returns to the program.  He attends an addict support group Maurizio has asked for names of therapists so talk to re: anxiety, I gave him 2 sources.  He is also trying to switch to the [place] for his medical care.  His attendance is irregular, he voices he will try harder to participate.  He has chronic back pain which is problematic.  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 [place] is stable on current treatment, seems to enjoy pulmonary rehab, asks lots of questions so as to gain more knowledge about COPD and supplemental oxygen. No signs of depression or psychosocial concerns at this time.     Expected Outcomes For Vick to be stable on current treatment for psychosocial status. -- For Trigg to continue to be free of barriers or concerns while participating in pulmonary rehab.     Interventions Relaxation education;Stress management education;Encouraged to attend Pulmonary Rehabilitation for the exercise Therapist referral;Encouraged to attend Pulmonary Rehabilitation for the exercise;Stress management education;Relaxation education --     Continue Psychosocial Services  Follow up required by staff Follow up required by staff Follow up required by staff              Psychosocial Discharge (Final Psychosocial Re-Evaluation):  Psychosocial Re-Evaluation - 07/16/21 0957       Psychosocial Re-Evaluation   Current issues with Current Depression;History of Depression;Current Anxiety/Panic    Comments Jlynn is stable on current treatment, seems to enjoy pulmonary rehab, asks lots of questions so as to gain more knowledge about COPD and supplemental oxygen. No signs of depression or psychosocial concerns at this time.    Expected Outcomes For [place] to continue to be free of barriers or concerns while participating in pulmonary rehab.    Continue Psychosocial Services  Follow up required by staff              Education: Education Goals: Education classes will be provided on a weekly basis, covering required topics. Participant will state understanding/return demonstration of topics presented.  Learning Barriers/Preferences:  Learning Barriers/Preferences - 05/22/21 9485       Learning Barriers/Preferences   Learning Barriers None    Learning Preferences Audio;Computer/Internet;Group Instruction;Individual Instruction;Pictoral;Skilled Demonstration;Verbal Instruction;Video;Written Material             Education Topics: Risk Factor Reduction:  -Group instruction that is supported by a PowerPoint presentation. Instructor discusses the definition of a risk factor, different risk factors for pulmonary disease, and how the heart and  lungs work together.     Nutrition for Pulmonary Patient:  -Group instruction provided by PowerPoint slides, verbal discussion, and written materials to support subject matter. The instructor gives an explanation and review of healthy diet recommendations, which includes a discussion on weight management, recommendations for fruit and vegetable consumption, as well as protein, fluid, caffeine, fiber, sodium, sugar, and alcohol. Tips for eating when patients are short of breath are discussed. Flowsheet Row PULMONARY REHAB CHRONIC OBSTRUCTIVE PULMONARY DISEASE from 07/18/2021 in [place]  Date 07/18/21  Educator sam  Instruction Review Code 1- Verbalizes Understanding       Pursed Lip Breathing:  -Group instruction that is supported by demonstration and informational handouts. Instructor discusses the benefits of pursed lip and diaphragmatic breathing and detailed demonstration on how to preform both.     Oxygen Safety:  -Group instruction provided by PowerPoint, verbal discussion, and written material to support subject matter. There is an overview of "What is Oxygen" and "Why do we need it".  Instructor also  reviews how to create a safe environment for oxygen use, the importance of using oxygen as prescribed, and the risks of noncompliance. There is a brief discussion on traveling with oxygen and resources the patient may utilize. Flowsheet Row PULMONARY REHAB CHRONIC OBSTRUCTIVE PULMONARY DISEASE from 07/18/2021 in [place]  Date 07/11/21  Educator Donnetta Simpers  [Handout]  Instruction Review Code 1- Verbalizes Understanding       Oxygen Equipment:  -Group instruction provided by [employer] Staff utilizing handouts, written materials, and equipment demonstrations.   Signs and Symptoms:  -Group instruction provided by written material and verbal discussion to support subject matter. Warning signs and symptoms of infection, stroke, and heart attack are reviewed and when to call the physician/911 reinforced. Tips for preventing the spread of infection discussed.   Advanced Directives:  -Group instruction provided by verbal instruction and written material to support subject matter. Instructor reviews Advanced Directive laws and proper instruction for filling out document.   Pulmonary Video:  -Group video education that reviews the importance of medication and oxygen compliance, exercise, good nutrition, pulmonary hygiene, and pursed lip and diaphragmatic breathing for the pulmonary patient.   Exercise for the Pulmonary Patient:  -Group instruction that is supported by a PowerPoint presentation. Instructor discusses benefits of exercise, core components of exercise, frequency, duration, and intensity of an exercise routine, importance of utilizing pulse oximetry during exercise, safety while exercising, and options of places to exercise outside of rehab.     Pulmonary Medications:  -Verbally interactive group education provided by instructor with focus on inhaled medications and proper administration.   Anatomy and Physiology of the Respiratory System and  Intimacy:  -Group instruction provided by PowerPoint, verbal discussion, and written material to support subject matter. Instructor reviews respiratory cycle and anatomical components of the respiratory system and their functions. Instructor also reviews differences in obstructive and restrictive respiratory diseases with examples of each. Intimacy, Sex, and Sexuality differences are reviewed with a discussion on how relationships can change when diagnosed with pulmonary disease. Common sexual concerns are reviewed. Flowsheet Row PULMONARY REHAB CHRONIC OBSTRUCTIVE PULMONARY DISEASE from 07/18/2021 in [place]  Date 06/27/21  Educator Donnetta Simpers       MD [place] group question and answer session with a medical doctor that allows participants to ask questions that relate to their pulmonary disease state.   OTHER EDUCATION -Group or individual verbal, written, or video  instructions that support the educational goals of the pulmonary rehab program. Flowsheet Row PULMONARY REHAB CHRONIC OBSTRUCTIVE PULMONARY DISEASE from 07/18/2021 in [place]  Date 06/20/21  [facility] Levels]  Educator Donnetta Simpers  Instruction Review Code 1- Verbalizes Understanding       Holiday Eating Survival Tips:  -Group instruction provided by [employer], verbal discussion, and written materials to support subject matter. The instructor gives patients tips, tricks, and techniques to help them not only survive but enjoy the holidays despite the onslaught of food that accompanies the holidays.   Knowledge Questionnaire Score:  Knowledge Questionnaire Score - 07/23/21 1606       Knowledge Questionnaire Score   Post Score 16/18             Core Components/Risk Factors/Patient Goals at Admission:  Personal Goals and Risk Factors at Admission - 05/22/21 0923       Core Components/Risk Factors/Patient Goals on Admission   Improve shortness of breath  with ADL's Yes    Intervention Provide education, individualized exercise plan and daily activity instruction to help decrease symptoms of SOB with activities of daily living.    Expected Outcomes Short Term: Improve cardiorespiratory fitness to achieve a reduction of symptoms when performing ADLs;Long Term: Be able to perform more ADLs without symptoms or delay the onset of symptoms    Increase knowledge of respiratory medications and ability to use respiratory devices properly  Yes    Intervention Provide education and demonstration as needed of appropriate use of medications, inhalers, and oxygen therapy.    Expected Outcomes Short Term: Achieves understanding of medications use. Understands that oxygen is a medication prescribed by physician. Demonstrates appropriate use of inhaler and oxygen therapy.;Long Term: Maintain appropriate use of medications, inhalers, and oxygen therapy.             Core Components/Risk Factors/Patient Goals Review:   Goals and Risk Factor Review     Row Name 05/28/21 0956 06/25/21 0913 07/16/21 0959         Core Components/Risk Factors/Patient Goals Review   Personal Goals Review Develop more efficient breathing techniques such as purse lipped breathing and diaphragmatic breathing and practicing self-pacing with activity.;Increase knowledge of respiratory medications and ability to use respiratory devices properly.;Improve shortness of breath with ADL's Develop more efficient breathing techniques such as purse lipped breathing and diaphragmatic breathing and practicing self-pacing with activity.;Increase knowledge of respiratory medications and ability to use respiratory devices properly.;Improve shortness of breath with ADL's Improve shortness of breath with ADL's;Increase knowledge of respiratory medications and ability to use respiratory devices properly.;Develop more efficient breathing techniques such as purse lipped breathing and diaphragmatic breathing and  practicing self-pacing with activity.     Review Has not started program, will hopefully begin 06/04/2021. Rithik has only attended 3 exercise sessions sine starting the end of April.  He has chronic back pain and gets injections which help somewhat.  He has been slow to progress with his exercise d/t irregular attendance and back pain and other appoints scheduled previously on the days of pulmonary rehab. Jerad is slow to progress due to his shortness of breath which is significant while he is walking.  He is exercising at 2.2 mets on the arm ergomenter and walking 2200 feet on the track in 15 minutes with frequent rest breaks.  He is learning much about COPD and supplemental oxygen.     Expected Outcomes See admission goals. See admission goals See admission goals.  Core Components/Risk Factors/Patient Goals at Discharge (Final Review):   Goals and Risk Factor Review - 07/16/21 0959       Core Components/Risk Factors/Patient Goals Review   Personal Goals Review Improve shortness of breath with ADL's;Increase knowledge of respiratory medications and ability to use respiratory devices properly.;Develop more efficient breathing techniques such as purse lipped breathing and diaphragmatic breathing and practicing self-pacing with activity.    Review Jahkai is slow to progress due to his shortness of breath which is significant while he is walking.  He is exercising at 2.2 mets on the arm ergomenter and walking 2200 feet on the track in 15 minutes with frequent rest breaks.  He is learning much about COPD and supplemental oxygen.    Expected Outcomes See admission goals.             ITP Comments: Pt is making expected progress toward Pulmonary Rehab goals after completing 12 sessions. Recommend continued exercise, life style modification, education, and utilization of breathing techniques to increase stamina and strength, while also decreasing shortness of breath with  exertion.  Dr. Rodman Pickle is Medical Director for Pulmonary Rehab at [facility].

## 2021-07-25 ENCOUNTER — Encounter (HOSPITAL_COMMUNITY)
Admission: RE | Admit: 2021-07-25 | Discharge: 2021-07-25 | Disposition: A | Discharge status: Home / Self Care | Payer: Medicare Other | Source: Ambulatory Visit | Attending: Emergency Medicine | Admitting: Emergency Medicine

## 2021-07-25 DIAGNOSIS — J449 Chronic obstructive pulmonary disease, unspecified: Secondary | ICD-10-CM | POA: Diagnosis not present

## 2021-07-25 NOTE — Progress Notes (Signed)
Daily Session Note  Patient Details  Name: [name] MRN: [id number] Date of Birth: 11/19/1955 Referring Provider:   April Manson Pulmonary Rehab Walk Test from 05/22/2021 in [place]  Referring Provider [place]       Encounter Date: 07/25/2021  Check In:  Session Check In - 07/25/21 1348       Check-In   Supervising physician immediately available to respond to emergencies [facility] immediately available    Physician(s) Dr. Karleen Hampshire    Location [facility]    Staff Present Rosebud Poles, RN, BSN;Vikrant Pryce Ysidro Evert, Cathleen Fears, MS, ACSM-CEP, Exercise Physiologist    Virtual Visit No    Medication changes reported     No    Fall or balance concerns reported    No    Tobacco Cessation No Change    Warm-up and Cool-down Performed as group-led instruction    Resistance Training Performed Yes    VAD Patient? No    PAD/SET Patient? No      Pain Assessment   Currently in Pain? No/denies    Multiple Pain Sites No             Capillary Blood Glucose: No results found for this or any previous visit (from the past 24 hour(s)).    Social History   Tobacco Use  Smoking Status Former   Packs/day: 1.00   Years: 45.00   Total pack years: 45.00   Types: Cigarettes   Start date: 29   Quit date: 06/01/2019   Years since quitting: 2.1  Smokeless Tobacco Never    Goals Met:  Exercise tolerated well No report of concerns or symptoms today Strength training completed today  Goals Unmet:  Not Applicable  Comments:  Service time is from 1315 to 1425   Dr. Rodman Pickle is Medical Director for Pulmonary Rehab at [facility].

## 2021-07-25 NOTE — Progress Notes (Addendum)
Discharge Progress Report  Patient Details  Name: [name] MRN: [id number] Date of Birth: 10/23/1955 Referring Provider:   April Manson [facility] Walk Test from 05/22/2021 in [place]  Referring Provider Byrum        Number of Visits: 13  Reason for Discharge:  Patient has met program and personal goals.  Smoking History:  Social History   Tobacco Use  Smoking Status Former   Packs/day: 1.00   Years: 45.00   Total pack years: 45.00   Types: Cigarettes   Start date: 65   Quit date: 06/01/2019   Years since quitting: 2.1  Smokeless Tobacco Never    Diagnosis:  Stage 2 moderate COPD by GOLD classification ([place])  Chronic obstructive pulmonary disease, unspecified COPD type ([place])  ADL [facility]SD:  Pulmonary Assessment Scores     Row Name 05/22/21 1531 07/23/21 1610       ADL [facility]   ADL Phase Entry Exit    SOB Score total 88 87      CAT Score   CAT Score 19 24      mMRC Score   mMRC Score 4 4             Initial Exercise Prescription:  Initial Exercise Prescription - 05/22/21 1000       Date of Initial Exercise RX and Referring Provider   Date 05/22/21    Referring Provider Byrum    Expected Discharge Date 07/25/21      Oxygen   Oxygen Continuous    Liters 2    Maintain Oxygen Saturation 88% or higher      Bike   Level 1    Minutes 15    METs 2.5      Track   Minutes 15      Prescription Details   Frequency (times per week) 2    Duration Progress to 30 minutes of continuous aerobic without signs/symptoms of physical distress      Intensity   THRR 40-80% of Max Heartrate 62-123    Ratings of Perceived Exertion 11-13    Perceived Dyspnea 0-4      Progression   Progression Continue to progress workloads to maintain intensity without signs/symptoms of physical distress.      Resistance Training   Training Prescription Yes    Weight red bands    Reps 10-15             Discharge  Exercise Prescription (Final Exercise Prescription Changes):  Exercise Prescription Changes - 07/16/21 1500       Response to Exercise   Blood Pressure (Admit) 108/60    Blood Pressure (Exercise) 136/80    Blood Pressure (Exit) 130/76    Heart Rate (Admit) 76 bpm    Heart Rate (Exercise) 89 bpm    Heart Rate (Exit) 67 bpm    Oxygen Saturation (Admit) 92 %    Oxygen Saturation (Exercise) 88 %    Oxygen Saturation (Exit) 93 %    Rating of Perceived Exertion (Exercise) 11    Perceived Dyspnea (Exercise) 1    Duration Progress to 30 minutes of  aerobic without signs/symptoms of physical distress    Intensity THRR unchanged      Progression   Progression Continue to progress workloads to maintain intensity without signs/symptoms of physical distress.      Resistance Training   Training Prescription Yes    Weight red bands    Reps 10-15  Time 10 Minutes      Oxygen   Oxygen Continuous    Liters 2      Arm Ergometer   Level 2    Minutes 15    METs 2.3      Track   Laps 4    Minutes 15    METs 1.46             Functional Capacity:  6 Minute Walk     Row Name 05/22/21 1028 07/18/21 1514       6 Minute Walk   Phase Initial Discharge    Distance 1044 feet 815 feet    Distance % Change -- -21.93 %    Distance Feet Change -- 229 ft    Walk Time 6 minutes 6 minutes    # of Rest Breaks 3  1:58-2:10, 3:11-3:30, 4:50-5:05 2  2:00-2:45, 3:00-4:00    MPH 1.98 1.54    METS 3.01 2.42    RPE 11 13    Perceived Dyspnea  2 2    VO2 Peak 10.54 8.48    Symptoms No No    Resting HR 60 bpm 61 bpm    Resting BP 138/80 110/70    Resting Oxygen Saturation  96 % 90 %    Exercise Oxygen Saturation  during 6 min walk 86 % 86 %    Max Ex. HR 98 bpm 93 bpm    Max Ex. BP 148/80 128/72    2 Minute Post BP 128/80 126/70      Interval HR   1 Minute HR 83 86    2 Minute HR 98 93    3 Minute HR 88 88    4 Minute HR 88 86    5 Minute HR 91 86    6 Minute HR 87 90    2 Minute  Post HR 62 66    Interval Heart Rate? Yes Yes      Interval Oxygen   Interval Oxygen? Yes Yes    Baseline Oxygen Saturation % 96 % 90 %    1 Minute Oxygen Saturation % 91 % 89 %    1 Minute Liters of Oxygen 2 L 4 L    2 Minute Oxygen Saturation % 89 % 88 %    2 Minute Liters of Oxygen 2 L 4 L    3 Minute Oxygen Saturation % 86 % 88 %    3 Minute Liters of Oxygen 2 L 4 L    4 Minute Oxygen Saturation % 90 % 86 %    4 Minute Liters of Oxygen 2 L 4 L    5 Minute Oxygen Saturation % 88 % 88 %    5 Minute Liters of Oxygen 2 L 6 L    6 Minute Oxygen Saturation % 88 % 88 %    6 Minute Liters of Oxygen 2 L 6 L    2 Minute Post Oxygen Saturation % 98 % 91 %    2 Minute Post Liters of Oxygen 2 L 66 L             Psychological, QOL, Others - Outcomes: PHQ 2/9:    07/18/2021    3:51 PM 05/22/2021    9:02 AM 04/29/2021    2:02 PM 03/26/2021    2:15 PM 12/24/2020    2:22 PM  Depression screen PHQ 2/9  Decreased Interest 0 0 1 0 0  Down, Depressed, Hopeless 1 1 0 1 1  PHQ - 2 Score 1 1 1 1 1   Altered sleeping 1 1 3  3   Tired, decreased energy 1 1 1   0  Change in appetite 0 0 1  0  Feeling bad or failure about yourself  0 0 0  0  Trouble concentrating 0 1 0  0  Moving slowly or fidgety/restless 0 0 0  0  Suicidal thoughts 0 0 0  0  PHQ-9 Score 3 4 6  4   Difficult doing work/chores Somewhat difficult Very difficult Not difficult at all  Not difficult at all    Quality of Life:   Personal Goals: Goals established at orientation with interventions provided to work toward goal.  Personal Goals and Risk Factors at Admission - 05/22/21 0923       Core Components/Risk Factors/Patient Goals on Admission   Improve shortness of breath with ADL's Yes    Intervention Provide education, individualized exercise plan and daily activity instruction to help decrease symptoms of SOB with activities of daily living.    Expected Outcomes Short Term: Improve cardiorespiratory fitness to achieve  a reduction of symptoms when performing ADLs;Long Term: Be able to perform more ADLs without symptoms or delay the onset of symptoms    Increase knowledge of respiratory medications and ability to use respiratory devices properly  Yes    Intervention Provide education and demonstration as needed of appropriate use of medications, inhalers, and oxygen therapy.    Expected Outcomes Short Term: Achieves understanding of medications use. Understands that oxygen is a medication prescribed by physician. Demonstrates appropriate use of inhaler and oxygen therapy.;Long Term: Maintain appropriate use of medications, inhalers, and oxygen therapy.              Personal Goals Discharge:  Goals and Risk Factor Review     Row Name 05/28/21 0956 06/25/21 0913 07/16/21 0959         Core Components/Risk Factors/Patient Goals Review   Personal Goals Review Develop more efficient breathing techniques such as purse lipped breathing and diaphragmatic breathing and practicing self-pacing with activity.;Increase knowledge of respiratory medications and ability to use respiratory devices properly.;Improve shortness of breath with ADL's Develop more efficient breathing techniques such as purse lipped breathing and diaphragmatic breathing and practicing self-pacing with activity.;Increase knowledge of respiratory medications and ability to use respiratory devices properly.;Improve shortness of breath with ADL's Improve shortness of breath with ADL's;Increase knowledge of respiratory medications and ability to use respiratory devices properly.;Develop more efficient breathing techniques such as purse lipped breathing and diaphragmatic breathing and practicing self-pacing with activity.     Review Has not started program, will hopefully begin 06/04/2021. Ramari has only attended 3 exercise sessions sine starting the end of April.  He has chronic back pain and gets injections which help somewhat.  He has been slow to progress  with his exercise d/t irregular attendance and back pain and other appoints scheduled previously on the days of pulmonary rehab. Deuntae is slow to progress due to his shortness of breath which is significant while he is walking.  He is exercising at 2.2 mets on the arm ergomenter and walking 2200 feet on the track in 15 minutes with frequent rest breaks.  He is learning much about COPD and supplemental oxygen.     Expected Outcomes See admission goals. See admission goals See admission goals.              Exercise Goals and Review:  Exercise Goals     Row Name  05/22/21 4403 05/28/21 4742 06/19/21 1152 07/16/21 0747       Exercise Goals   Increase Physical Activity Yes Yes Yes Yes    Intervention Provide advice, education, support and counseling about physical activity/exercise needs.;Develop an individualized exercise prescription for aerobic and resistive training based on initial evaluation findings, risk stratification, comorbidities and participant's personal goals. Provide advice, education, support and counseling about physical activity/exercise needs.;Develop an individualized exercise prescription for aerobic and resistive training based on initial evaluation findings, risk stratification, comorbidities and participant's personal goals. Provide advice, education, support and counseling about physical activity/exercise needs.;Develop an individualized exercise prescription for aerobic and resistive training based on initial evaluation findings, risk stratification, comorbidities and participant's personal goals. Provide advice, education, support and counseling about physical activity/exercise needs.;Develop an individualized exercise prescription for aerobic and resistive training based on initial evaluation findings, risk stratification, comorbidities and participant's personal goals.    Expected Outcomes Short Term: Attend rehab on a regular basis to increase amount of physical  activity.;Long Term: Add in home exercise to make exercise part of routine and to increase amount of physical activity.;Long Term: Exercising regularly at least 3-5 days a week. Short Term: Attend rehab on a regular basis to increase amount of physical activity.;Long Term: Add in home exercise to make exercise part of routine and to increase amount of physical activity.;Long Term: Exercising regularly at least 3-5 days a week. Short Term: Attend rehab on a regular basis to increase amount of physical activity.;Long Term: Add in home exercise to make exercise part of routine and to increase amount of physical activity.;Long Term: Exercising regularly at least 3-5 days a week. Short Term: Attend rehab on a regular basis to increase amount of physical activity.;Long Term: Add in home exercise to make exercise part of routine and to increase amount of physical activity.;Long Term: Exercising regularly at least 3-5 days a week.    Increase Strength and Stamina Yes Yes Yes Yes    Intervention Provide advice, education, support and counseling about physical activity/exercise needs.;Develop an individualized exercise prescription for aerobic and resistive training based on initial evaluation findings, risk stratification, comorbidities and participant's personal goals. Provide advice, education, support and counseling about physical activity/exercise needs.;Develop an individualized exercise prescription for aerobic and resistive training based on initial evaluation findings, risk stratification, comorbidities and participant's personal goals. Provide advice, education, support and counseling about physical activity/exercise needs.;Develop an individualized exercise prescription for aerobic and resistive training based on initial evaluation findings, risk stratification, comorbidities and participant's personal goals. Provide advice, education, support and counseling about physical activity/exercise needs.;Develop an  individualized exercise prescription for aerobic and resistive training based on initial evaluation findings, risk stratification, comorbidities and participant's personal goals.    Expected Outcomes Short Term: Increase workloads from initial exercise prescription for resistance, speed, and METs.;Short Term: Perform resistance training exercises routinely during rehab and add in resistance training at home;Long Term: Improve cardiorespiratory fitness, muscular endurance and strength as measured by increased METs and functional capacity (6MWT) Short Term: Increase workloads from initial exercise prescription for resistance, speed, and METs.;Short Term: Perform resistance training exercises routinely during rehab and add in resistance training at home;Long Term: Improve cardiorespiratory fitness, muscular endurance and strength as measured by increased METs and functional capacity (6MWT) Short Term: Increase workloads from initial exercise prescription for resistance, speed, and METs.;Short Term: Perform resistance training exercises routinely during rehab and add in resistance training at home;Long Term: Improve cardiorespiratory fitness, muscular endurance and strength as measured by increased METs and  functional capacity (6MWT) Short Term: Increase workloads from initial exercise prescription for resistance, speed, and METs.;Short Term: Perform resistance training exercises routinely during rehab and add in resistance training at home;Long Term: Improve cardiorespiratory fitness, muscular endurance and strength as measured by increased METs and functional capacity (6MWT)    Able to understand and use rate of perceived exertion (RPE) scale Yes Yes Yes Yes    Intervention Provide education and explanation on how to use RPE scale Provide education and explanation on how to use RPE scale Provide education and explanation on how to use RPE scale Provide education and explanation on how to use RPE scale    Expected  Outcomes Short Term: Able to use RPE daily in rehab to express subjective intensity level;Long Term:  Able to use RPE to guide intensity level when exercising independently Short Term: Able to use RPE daily in rehab to express subjective intensity level;Long Term:  Able to use RPE to guide intensity level when exercising independently Short Term: Able to use RPE daily in rehab to express subjective intensity level;Long Term:  Able to use RPE to guide intensity level when exercising independently Short Term: Able to use RPE daily in rehab to express subjective intensity level;Long Term:  Able to use RPE to guide intensity level when exercising independently    Able to understand and use Dyspnea scale Yes Yes Yes Yes    Intervention Provide education and explanation on how to use Dyspnea scale Provide education and explanation on how to use Dyspnea scale Provide education and explanation on how to use Dyspnea scale Provide education and explanation on how to use Dyspnea scale    Expected Outcomes Short Term: Able to use Dyspnea scale daily in rehab to express subjective sense of shortness of breath during exertion;Long Term: Able to use Dyspnea scale to guide intensity level when exercising independently Short Term: Able to use Dyspnea scale daily in rehab to express subjective sense of shortness of breath during exertion;Long Term: Able to use Dyspnea scale to guide intensity level when exercising independently Short Term: Able to use Dyspnea scale daily in rehab to express subjective sense of shortness of breath during exertion;Long Term: Able to use Dyspnea scale to guide intensity level when exercising independently Short Term: Able to use Dyspnea scale daily in rehab to express subjective sense of shortness of breath during exertion;Long Term: Able to use Dyspnea scale to guide intensity level when exercising independently    Knowledge and understanding of Target Heart Rate Range (THRR) Yes Yes Yes Yes     Intervention Provide education and explanation of THRR including how the numbers were predicted and where they are located for reference Provide education and explanation of THRR including how the numbers were predicted and where they are located for reference Provide education and explanation of THRR including how the numbers were predicted and where they are located for reference Provide education and explanation of THRR including how the numbers were predicted and where they are located for reference    Expected Outcomes Short Term: Able to state/look up THRR;Long Term: Able to use THRR to govern intensity when exercising independently;Short Term: Able to use daily as guideline for intensity in rehab Short Term: Able to state/look up THRR;Long Term: Able to use THRR to govern intensity when exercising independently;Short Term: Able to use daily as guideline for intensity in rehab Short Term: Able to state/look up THRR;Long Term: Able to use THRR to govern intensity when exercising independently;Short Term: Able to  use daily as guideline for intensity in rehab Short Term: Able to state/look up THRR;Long Term: Able to use THRR to govern intensity when exercising independently;Short Term: Able to use daily as guideline for intensity in rehab    Understanding of Exercise Prescription Yes Yes Yes Yes    Intervention Provide education, explanation, and written materials on patient's individual exercise prescription Provide education, explanation, and written materials on patient's individual exercise prescription Provide education, explanation, and written materials on patient's individual exercise prescription Provide education, explanation, and written materials on patient's individual exercise prescription    Expected Outcomes Short Term: Able to explain program exercise prescription;Long Term: Able to explain home exercise prescription to exercise independently Short Term: Able to explain program exercise  prescription;Long Term: Able to explain home exercise prescription to exercise independently Short Term: Able to explain program exercise prescription;Long Term: Able to explain home exercise prescription to exercise independently Short Term: Able to explain program exercise prescription;Long Term: Able to explain home exercise prescription to exercise independently             Exercise Goals Re-Evaluation:  Exercise Goals Re-Evaluation     Row Name 05/28/21 0813 06/19/21 1152 07/16/21 0747         Exercise Goal Re-Evaluation   Exercise Goals Review Increase Physical Activity;Increase Strength and Stamina;Able to understand and use rate of perceived exertion (RPE) scale;Able to understand and use Dyspnea scale;Knowledge and understanding of Target Heart Rate Range (THRR);Understanding of Exercise Prescription Increase Physical Activity;Increase Strength and Stamina;Able to understand and use rate of perceived exertion (RPE) scale;Able to understand and use Dyspnea scale;Knowledge and understanding of Target Heart Rate Range (THRR);Understanding of Exercise Prescription Increase Physical Activity;Increase Strength and Stamina;Able to understand and use rate of perceived exertion (RPE) scale;Able to understand and use Dyspnea scale;Knowledge and understanding of Target Heart Rate Range (THRR);Understanding of Exercise Prescription     Comments Parry is scheduled to begin exercise this week. Will continue to monitor and progress as able. Heidi has completed 2 exercise sessions. He exercises for 15 min on the arm ergometer and track. He averages 2.2 METs at level 1 on the arm ergometer and 1.93 METs on the track. He performs the warmup and cooldown standing without limitations. He has missed several sessions for appointments and personal health reasons. It has been difficult to progress him because of his absences. Will continue to monitor and progress as able. Zigmond has completed 9 exercise  sessions. He exercises for 15 min on the arm ergometer and track. He averages 2.2 METs at level 1.5 on the arm ergometer and 2.28 METs on the track. He performs the warmup and cooldown standing without limitations. Dewaun has increased his workload on the arm ergometer, although METs have remained the same. He is very limited by his lung condition and back pain. I think this causes him to doubt himself in progressing. Will continue to monitor and progress as able.     Expected Outcomes Through exercise at rehab and home the patient will decrease shortness of breath with daily activities and feel confident in carrying out an exercise regimn at home. Through exercise at rehab and home the patient will decrease shortness of breath with daily activities and feel confident in carrying out an exercise regimn at home. Through exercise at rehab and home the patient will decrease shortness of breath with daily activities and feel confident in carrying out an exercise regimn at home.  Nutrition & Weight - Outcomes:   Post Biometrics - 05/22/21 0826        Post  Biometrics   Height 5' 9"  (1.753 m)    BMI (Calculated) 24.21    Grip Strength 29 kg             Nutrition:  Nutrition Therapy & Goals - 07/23/21 1449       Nutrition Therapy   Diet Heart Healthy Diet      Personal Nutrition Goals   Nutrition Goal Patient to build a healthy plate to include lean protein/plant protein, fruits, vegetables, whole grains, and low fat dairy as part of a heart healthy meal plan.   in progress   Personal Goal #2 Patient to implement >2-5 vegetables servings daily and >1 fruit serving per day.   in progress   Personal Goal #3 Patient to monitor hydration/fluid intake; aim for ~64oz of fluids daily.   in progress   Personal Goal #4 Patient to implement Nutrition supplements as needed to support calorie needs and decrease frequency of meal skipping.   in progress   Comments Patient enjoys  cooking; he does report some high calorie food choices and cooking methods. This is likely helping with maintaining caloric needs.      Intervention Plan   Intervention Nutrition handout(s) given to patient.;Prescribe, educate and counsel regarding individualized specific dietary modifications aiming towards targeted core components such as weight, hypertension, lipid management, diabetes, heart failure and other comorbidities.   Gave handouts on convenience foods and the plate method   Expected Outcomes Long Term Goal: Adherence to prescribed nutrition plan.             Nutrition Discharge:  Nutrition Assessments - 07/23/21 1443       Rate Your Plate Scores   Post Score 35   patient chooses many high calorie food items due high calorie needs (butter, ice cream, whole milk, etc)            Education Questionnaire Score:  Knowledge Questionnaire Score - 07/23/21 1606       Knowledge Questionnaire Score   Post Score 16/18             Goals reviewed with patient; copy given to patient.

## 2021-07-26 ENCOUNTER — Ambulatory Visit: Payer: Medicare Other | Admitting: Nurse Practitioner

## 2021-07-29 ENCOUNTER — Ambulatory Visit (INDEPENDENT_AMBULATORY_CARE_PROVIDER_SITE_OTHER): Payer: Medicare Other | Admitting: Nurse Practitioner

## 2021-07-29 ENCOUNTER — Encounter: Payer: Self-pay | Admitting: Nurse Practitioner

## 2021-07-29 VITALS — BP 142/90 | HR 73 | Ht 69.0 in | Wt 168.0 lb

## 2021-07-29 DIAGNOSIS — I2510 Atherosclerotic heart disease of native coronary artery without angina pectoris: Secondary | ICD-10-CM

## 2021-07-29 DIAGNOSIS — R030 Elevated blood-pressure reading, without diagnosis of hypertension: Secondary | ICD-10-CM | POA: Diagnosis not present

## 2021-07-29 DIAGNOSIS — C3490 Malignant neoplasm of unspecified part of unspecified bronchus or lung: Secondary | ICD-10-CM

## 2021-07-29 DIAGNOSIS — E785 Hyperlipidemia, unspecified: Secondary | ICD-10-CM | POA: Diagnosis not present

## 2021-07-29 DIAGNOSIS — J9611 Chronic respiratory failure with hypoxia: Secondary | ICD-10-CM

## 2021-07-29 DIAGNOSIS — K219 Gastro-esophageal reflux disease without esophagitis: Secondary | ICD-10-CM

## 2021-07-29 DIAGNOSIS — R072 Precordial pain: Secondary | ICD-10-CM | POA: Diagnosis not present

## 2021-07-29 DIAGNOSIS — J432 Centrilobular emphysema: Secondary | ICD-10-CM

## 2021-07-29 MED ORDER — METOPROLOL TARTRATE 100 MG PO TABS: ORAL_TABLET | ORAL | 0 refills | Status: DC

## 2021-07-31 ENCOUNTER — Encounter (HOSPITAL_COMMUNITY): Payer: Self-pay | Admitting: Emergency Medicine

## 2021-07-31 ENCOUNTER — Ambulatory Visit: Payer: Medicare Other | Admitting: Emergency Medicine

## 2021-08-05 ENCOUNTER — Other Ambulatory Visit: Payer: Medicare Other

## 2021-08-07 ENCOUNTER — Other Ambulatory Visit: Payer: Self-pay | Admitting: Nurse Practitioner

## 2021-08-07 ENCOUNTER — Ambulatory Visit (INDEPENDENT_AMBULATORY_CARE_PROVIDER_SITE_OTHER): Payer: Medicare Other | Admitting: Nurse Practitioner

## 2021-08-07 ENCOUNTER — Encounter: Payer: Self-pay | Admitting: Nurse Practitioner

## 2021-08-07 VITALS — BP 112/72 | HR 58 | Temp 97.7°F | Ht 69.0 in | Wt 163.2 lb

## 2021-08-07 DIAGNOSIS — J9611 Chronic respiratory failure with hypoxia: Secondary | ICD-10-CM

## 2021-08-07 DIAGNOSIS — R079 Chest pain, unspecified: Secondary | ICD-10-CM

## 2021-08-07 DIAGNOSIS — J449 Chronic obstructive pulmonary disease, unspecified: Secondary | ICD-10-CM | POA: Diagnosis not present

## 2021-08-07 DIAGNOSIS — J432 Centrilobular emphysema: Secondary | ICD-10-CM

## 2021-08-07 DIAGNOSIS — R911 Solitary pulmonary nodule: Secondary | ICD-10-CM | POA: Diagnosis not present

## 2021-08-07 DIAGNOSIS — R197 Diarrhea, unspecified: Secondary | ICD-10-CM | POA: Diagnosis not present

## 2021-08-07 MED ORDER — DOXYCYCLINE HYCLATE 100 MG PO TABS: ORAL_TABLET | ORAL | 3 refills | Status: DC

## 2021-08-07 NOTE — Assessment & Plan Note (Signed)
Possible side effect of Protonix given timing. Advised he could trial off Protonix to see if diarrhea resolves. Recommended he contact his PCP for further workup if no improvement.

## 2021-08-07 NOTE — Assessment & Plan Note (Signed)
Recent CT scan with resolution of right lung nodule.  There was a new 12 x 4 x 8 mm left lower lobe nodule.  Imaging reviewed by Dr. Lamonte Sakai and suspected to possibly be inflammatory.  We will plan for repeat CT chest in September for 3 month follow up to ensure stability.

## 2021-08-07 NOTE — Patient Instructions (Addendum)
Continue Trelegy 1 puff daily. Brush tongue and rinse mouth afterwards Continue Albuterol inhaler 2 puffs or 3 mL neb every 6 hours as needed for shortness of breath or wheezing. Notify if symptoms persist despite rescue inhaler/neb use. Continue flonase nasal spray 2 sprays each nostril daily Continue supplemental oxygen 2 lpm for goal oxygen level >88-90% Continue guaifenesin 10 mL every 6 hours as needed for chest congestion Continue daily prednisone 10 mg  Continue supplemental oxygen 2 lpm continuous until you have your POC fixed. Goal oxygen >88-90%. [facility] will call you today about your POC   Restart rotating antibiotics for you to use during the first week of alternating months: -Cefuroxime 250 mg twice a day for 7 days (February, May, August, November) -Azithromycin 250 mg once daily for 5 days (March, June, September, December) -Doxycycline 100 mg twice a day for 7 days (April, July, October, January)   CT chest in September - someone will contact you for scheduling   Trial off protonix to see if your diarrhea resolves. Drink plenty of fluids. Follow up with your PCP.   Follow up in September after CT with Dr. Lamonte Sakai. If symptoms do not improve or worsen, please contact office for sooner follow up or seek emergency care.

## 2021-08-07 NOTE — Assessment & Plan Note (Addendum)
>>  ASSESSMENT AND PLAN FOR CHRONIC RESPIRATORY FAILURE WITH HYPOXIA (HCC) WRITTEN ON 08/07/2021  4:56 PM BY Ireta Pullman V, NP  Contacted [facility] during his appointment; they will contact him this afternoon to determine next steps for getting him a working POC. Advised he use his portable tanks in the interim. He required 2 lpm with activity at last visit to maintain sats >88%. Goal oxygen >88-90%.   >>ASSESSMENT AND PLAN FOR MULTIPLE PULMONARY NODULES WRITTEN ON 08/07/2021  4:57 PM BY Luan Urbani V, NP  Recent CT scan with resolution of right lung nodule.  There was a new 12 x 4 x 8 mm left lower lobe nodule.  Imaging reviewed by Dr. Delton Coombes and suspected to possibly be inflammatory.  We will plan for repeat CT chest in September for 3 month follow up to ensure stability.
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
## 2021-08-07 NOTE — Assessment & Plan Note (Signed)
Improved after starting on Protonix and prednisone burst. Still having occasional symptoms; this has been an ongoing problem for him for years, off and on. Previous EKG unremarkable and CXR with acute process. He is being evaluated by cardiology with plans for cardiac CT for risk stratification.

## 2021-08-07 NOTE — Assessment & Plan Note (Addendum)
>>  ASSESSMENT AND PLAN FOR COPD WITH EMPHYSEMA (HCC) WRITTEN ON 08/07/2021  5:00 PM BY Lori Liew V, NP  Severe COPD with high symptom burden. Treated for questionable AECOPD at last OV with increase to 20 mg of prednisone for a week. He has since restarted his daily 10 mg and breathing is stable. Continue on his aggressive maintenance regimen with triple therapy, rotating antibiotics and chronic steroids. Advised that he has refills available on all of his abx and he just needs to contact the pharmacy for refills every month - doxy rx sent today for him to start for July. Verbalized understanding.  Patient Instructions  Continue Trelegy 1 puff daily. Brush tongue and rinse mouth afterwards Continue Albuterol inhaler 2 puffs or 3 mL neb every 6 hours as needed for shortness of breath or wheezing. Notify if symptoms persist despite rescue inhaler/neb use. Continue flonase nasal spray 2 sprays each nostril daily Continue supplemental oxygen 2 lpm for goal oxygen level >88-90% Continue guaifenesin 10 mL every 6 hours as needed for chest congestion Continue daily prednisone 10 mg  Continue supplemental oxygen 2 lpm continuous until you have your POC fixed. Goal oxygen >88-90%. [facility] will call you today about your POC   Restart rotating antibiotics for you to use during the first week of alternating months: -Cefuroxime 250 mg twice a day for 7 days (February, May, August, November) -Azithromycin 250 mg once daily for 5 days (March, June, September, December) -Doxycycline 100 mg twice a day for 7 days (April, July, October, January)   CT chest in September - someone will contact you for scheduling   Trial off protonix to see if your diarrhea resolves. Drink plenty of fluids. Follow up with your PCP.   Follow up in September after CT with Dr. Delton Coombes. If symptoms do not improve or worsen, please contact office for sooner follow up or seek emergency care.    >>ASSESSMENT AND PLAN FOR CHRONIC  RESPIRATORY FAILURE WITH HYPOXIA (HCC) WRITTEN ON 11/05/2022  4:57 PM BY Jerie Basford V, NP  >>ASSESSMENT AND PLAN FOR CHRONIC RESPIRATORY FAILURE WITH HYPOXIA (HCC) WRITTEN ON 08/07/2021  4:56 PM BY Eppie Barhorst V, NP  Contacted [facility] during his appointment; they will contact him this afternoon to determine next steps for getting him a working POC. Advised he use his portable tanks in the interim. He required 2 lpm with activity at last visit to maintain sats >88%. Goal oxygen >88-90%.   >>ASSESSMENT AND PLAN FOR MULTIPLE PULMONARY NODULES WRITTEN ON 08/07/2021  4:57 PM BY Miabella Shannahan V, NP  Recent CT scan with resolution of right lung nodule.  There was a new 12 x 4 x 8 mm left lower lobe nodule.  Imaging reviewed by Dr. Delton Coombes and suspected to possibly be inflammatory.  We will plan for repeat CT chest in September for 3 month follow up to ensure stability.

## 2021-08-12 ENCOUNTER — Ambulatory Visit (HOSPITAL_BASED_OUTPATIENT_CLINIC_OR_DEPARTMENT_OTHER)
Admission: RE | Admit: 2021-08-12 | Discharge: 2021-08-12 | Disposition: A | Discharge status: Home / Self Care | Payer: Medicare Other | Source: Ambulatory Visit | Attending: Nurse Practitioner | Admitting: Nurse Practitioner

## 2021-08-12 ENCOUNTER — Other Ambulatory Visit: Payer: Medicare Other

## 2021-08-12 DIAGNOSIS — R911 Solitary pulmonary nodule: Secondary | ICD-10-CM | POA: Insufficient documentation

## 2021-08-12 DIAGNOSIS — J439 Emphysema, unspecified: Secondary | ICD-10-CM | POA: Diagnosis not present

## 2021-08-12 DIAGNOSIS — Z85110 Personal history of malignant carcinoid tumor of bronchus and lung: Secondary | ICD-10-CM | POA: Diagnosis not present

## 2021-08-12 DIAGNOSIS — R918 Other nonspecific abnormal finding of lung field: Secondary | ICD-10-CM | POA: Diagnosis not present

## 2021-08-15 ENCOUNTER — Telehealth: Payer: Self-pay | Admitting: Nurse Practitioner

## 2021-08-15 ENCOUNTER — Telehealth: Payer: Self-pay | Admitting: Gastroenterology

## 2021-08-15 ENCOUNTER — Other Ambulatory Visit: Payer: Self-pay | Admitting: Nurse Practitioner

## 2021-08-15 ENCOUNTER — Ambulatory Visit: Payer: Medicare Other | Admitting: Nurse Practitioner

## 2021-08-15 DIAGNOSIS — I2510 Atherosclerotic heart disease of native coronary artery without angina pectoris: Secondary | ICD-10-CM | POA: Diagnosis not present

## 2021-08-15 DIAGNOSIS — R911 Solitary pulmonary nodule: Secondary | ICD-10-CM

## 2021-08-15 DIAGNOSIS — R072 Precordial pain: Secondary | ICD-10-CM | POA: Diagnosis not present

## 2021-08-15 NOTE — Telephone Encounter (Signed)
ATC patient and go over CT scan results from [place]. Unable to leave voicemail due to patient not having one set up. Will try again

## 2021-08-15 NOTE — Telephone Encounter (Signed)
Left message on machine to call back  

## 2021-08-15 NOTE — Telephone Encounter (Signed)
Pt returning call and can be reached @ [phone].[name]

## 2021-08-15 NOTE — Telephone Encounter (Signed)
[name] put in order for PET scan.  I called pt to give him appt info and pt was not aware of test being ordered.  I gave him appt info and told him a nurse would be calling him to go over CT results with him.

## 2021-08-15 NOTE — Telephone Encounter (Signed)
I have made an OV for this patient and he is aware of the appointment time. Nothing further needed.

## 2021-08-16 ENCOUNTER — Ambulatory Visit: Payer: Medicare Other | Admitting: Emergency Medicine

## 2021-08-16 LAB — BASIC METABOLIC PANEL
BUN/Creatinine Ratio: 7 — ABNORMAL LOW (ref 10–24)
BUN: 9 mg/dL (ref 8–27)
CO2: 22 mmol/L (ref 20–29)
Calcium: 9.6 mg/dL (ref 8.6–10.2)
Chloride: 102 mmol/L (ref 96–106)
Creatinine, Ser: 1.34 mg/dL — ABNORMAL HIGH (ref 0.76–1.27)
Glucose: 71 mg/dL (ref 70–99)
Potassium: 4.5 mmol/L (ref 3.5–5.2)
Sodium: 141 mmol/L (ref 134–144)
eGFR: 58 mL/min/{1.73_m2} — ABNORMAL LOW (ref >59)

## 2021-08-16 NOTE — Telephone Encounter (Signed)
Line rings then goes busy

## 2021-08-19 ENCOUNTER — Ambulatory Visit: Payer: Medicare Other

## 2021-08-19 ENCOUNTER — Telehealth (HOSPITAL_COMMUNITY): Payer: Self-pay | Admitting: *Deleted

## 2021-08-19 ENCOUNTER — Other Ambulatory Visit: Payer: Self-pay

## 2021-08-19 ENCOUNTER — Encounter: Payer: Medicare Other | Admitting: Student

## 2021-08-19 MED ORDER — NITROGLYCERIN 0.4 MG SL SUBL: 0.4000 mg | SUBLINGUAL_TABLET | SUBLINGUAL | 3 refills | Status: DC | PRN

## 2021-08-19 NOTE — Telephone Encounter (Signed)
Line rings then goes busy.  Will await return call from the pt

## 2021-08-19 NOTE — Telephone Encounter (Signed)
Reaching out to patient to offer assistance regarding upcoming cardiac imaging study; pt verbalizes understanding of appt date/time, parking situation and where to check in, pre-test NPO status and verified current allergies; name and call back number provided for further questions should they arise  [facility] [phone] office [phone] cell  Patient states he HR is  usually low and was asked not to take the metoprolol for the test. He is aware to arrive at 2:30pm.

## 2021-08-20 ENCOUNTER — Ambulatory Visit (INDEPENDENT_AMBULATORY_CARE_PROVIDER_SITE_OTHER): Payer: Medicare Other | Admitting: Student

## 2021-08-20 ENCOUNTER — Encounter: Payer: Self-pay | Admitting: *Deleted

## 2021-08-20 ENCOUNTER — Ambulatory Visit (HOSPITAL_BASED_OUTPATIENT_CLINIC_OR_DEPARTMENT_OTHER)
Admission: RE | Admit: 2021-08-20 | Discharge: 2021-08-20 | Disposition: A | Discharge status: Home / Self Care | Payer: Medicare Other | Source: Ambulatory Visit | Attending: Cardiovascular Disease | Admitting: Cardiovascular Disease

## 2021-08-20 ENCOUNTER — Other Ambulatory Visit: Payer: Self-pay | Admitting: Cardiovascular Disease

## 2021-08-20 ENCOUNTER — Ambulatory Visit (INDEPENDENT_AMBULATORY_CARE_PROVIDER_SITE_OTHER): Payer: Medicare Other | Admitting: *Deleted

## 2021-08-20 ENCOUNTER — Ambulatory Visit (HOSPITAL_COMMUNITY)
Admission: RE | Admit: 2021-08-20 | Discharge: 2021-08-20 | Disposition: A | Discharge status: Home / Self Care | Payer: Medicare Other | Source: Ambulatory Visit | Attending: Nurse Practitioner | Admitting: Nurse Practitioner

## 2021-08-20 VITALS — BP 106/69 | HR 60 | Wt 161.8 lb

## 2021-08-20 VITALS — BP 106/69 | HR 62 | Wt 161.8 lb

## 2021-08-20 DIAGNOSIS — I2510 Atherosclerotic heart disease of native coronary artery without angina pectoris: Secondary | ICD-10-CM

## 2021-08-20 DIAGNOSIS — R931 Abnormal findings on diagnostic imaging of heart and coronary circulation: Secondary | ICD-10-CM

## 2021-08-20 DIAGNOSIS — R072 Precordial pain: Secondary | ICD-10-CM | POA: Diagnosis not present

## 2021-08-20 DIAGNOSIS — Z0000 Encounter for general adult medical examination without abnormal findings: Secondary | ICD-10-CM

## 2021-08-20 DIAGNOSIS — K529 Noninfective gastroenteritis and colitis, unspecified: Secondary | ICD-10-CM

## 2021-08-20 DIAGNOSIS — F419 Anxiety disorder, unspecified: Secondary | ICD-10-CM | POA: Diagnosis not present

## 2021-08-20 MED ORDER — ONDANSETRON 4 MG PO TBDP: 4.0000 mg | ORAL_TABLET | Freq: Three times a day (TID) | ORAL | 0 refills | Status: DC | PRN

## 2021-08-20 MED ORDER — DILTIAZEM HCL 25 MG/5ML IV SOLN
5.0000 mg | Freq: Once | INTRAVENOUS | Status: AC
  Administered 2021-08-20: 5 mg via INTRAVENOUS

## 2021-08-20 MED ORDER — IOHEXOL 350 MG/ML SOLN
100.0000 mL | Freq: Once | INTRAVENOUS | Status: AC | PRN
  Administered 2021-08-20: 100 mL via INTRAVENOUS

## 2021-08-20 MED ORDER — NITROGLYCERIN 0.4 MG SL SUBL
0.8000 mg | SUBLINGUAL_TABLET | Freq: Once | SUBLINGUAL | Status: AC
  Administered 2021-08-20: 0.8 mg via SUBLINGUAL

## 2021-08-20 MED ORDER — DILTIAZEM HCL 25 MG/5ML IV SOLN
INTRAVENOUS | Status: AC
  Filled 2021-08-20: qty 5

## 2021-08-20 MED ORDER — NITROGLYCERIN 0.4 MG SL SUBL
SUBLINGUAL_TABLET | SUBLINGUAL | Status: AC
  Filled 2021-08-20: qty 2

## 2021-08-20 MED ORDER — DULOXETINE HCL 30 MG PO CPEP: 30.0000 mg | ORAL_CAPSULE | Freq: Every day | ORAL | 1 refills | Status: DC

## 2021-08-20 NOTE — Patient Instructions (Signed)
Health Maintenance, Male Adopting a healthy lifestyle and getting preventive care are important in promoting health and wellness. Ask your health care provider about: The right schedule for you to have regular tests and exams. Things you can do on your own to prevent diseases and keep yourself healthy. What should I know about diet, weight, and exercise? Eat a healthy diet  Eat a diet that includes plenty of vegetables, fruits, low-fat dairy products, and lean protein. Do not eat a lot of foods that are high in solid fats, added sugars, or sodium. Maintain a healthy weight Body mass index (BMI) is a measurement that can be used to identify possible weight problems. It estimates body fat based on height and weight. Your health care provider can help determine your BMI and help you achieve or maintain a healthy weight. Get regular exercise Get regular exercise. This is one of the most important things you can do for your health. Most adults should: Exercise for at least 150 minutes each week. The exercise should increase your heart rate and make you sweat (moderate-intensity exercise). Do strengthening exercises at least twice a week. This is in addition to the moderate-intensity exercise. Spend less time sitting. Even light physical activity can be beneficial. Watch cholesterol and blood lipids Have your blood tested for lipids and cholesterol at 66 years of age, then have this test every 5 years. You may need to have your cholesterol levels checked more often if: Your lipid or cholesterol levels are high. You are older than 66 years of age. You are at high risk for heart disease. What should I know about cancer screening? Many types of cancers can be detected early and may often be prevented. Depending on your health history and family history, you may need to have cancer screening at various ages. This may include screening for: Colorectal cancer. Prostate cancer. Skin cancer. Lung  cancer. What should I know about heart disease, diabetes, and high blood pressure? Blood pressure and heart disease High blood pressure causes heart disease and increases the risk of stroke. This is more likely to develop in people who have high blood pressure readings or are overweight. Talk with your health care provider about your target blood pressure readings. Have your blood pressure checked: Every 3-5 years if you are 18-39 years of age. Every year if you are 40 years old or older. If you are between the ages of 65 and 75 and are a current or former smoker, ask your health care provider if you should have a one-time screening for abdominal aortic aneurysm (AAA). Diabetes Have regular diabetes screenings. This checks your fasting blood sugar level. Have the screening done: Once every three years after age 45 if you are at a normal weight and have a low risk for diabetes. More often and at a younger age if you are overweight or have a high risk for diabetes. What should I know about preventing infection? Hepatitis B If you have a higher risk for hepatitis B, you should be screened for this virus. Talk with your health care provider to find out if you are at risk for hepatitis B infection. Hepatitis C Blood testing is recommended for: Everyone born from 1945 through 1965. Anyone with known risk factors for hepatitis C. Sexually transmitted infections (STIs) You should be screened each year for STIs, including gonorrhea and chlamydia, if: You are sexually active and are younger than 66 years of age. You are older than 66 years of age and your   health care provider tells you that you are at risk for this type of infection. Your sexual activity has changed since you were last screened, and you are at increased risk for chlamydia or gonorrhea. Ask your health care provider if you are at risk. Ask your health care provider about whether you are at high risk for HIV. Your health care provider  may recommend a prescription medicine to help prevent HIV infection. If you choose to take medicine to prevent HIV, you should first get tested for HIV. You should then be tested every 3 months for as long as you are taking the medicine. Follow these instructions at home: Alcohol use Do not drink alcohol if your health care provider tells you not to drink. If you drink alcohol: Limit how much you have to 0-2 drinks a day. Know how much alcohol is in your drink. In the [place] one drink equals one 12 oz bottle of beer (355 mL), one 5 oz glass of wine (148 mL), or one 1 oz glass of hard liquor (44 mL). Lifestyle Do not use any products that contain nicotine or tobacco. These products include cigarettes, chewing tobacco, and vaping devices, such as e-cigarettes. If you need help quitting, ask your health care provider. Do not use street drugs. Do not share needles. Ask your health care provider for help if you need support or information about quitting drugs. General instructions Schedule regular health, dental, and eye exams. Stay current with your vaccines. Tell your health care provider if: You often feel depressed. You have ever been abused or do not feel safe at home. Summary Adopting a healthy lifestyle and getting preventive care are important in promoting health and wellness. Follow your health care provider's instructions about healthy diet, exercising, and getting tested or screened for diseases. Follow your health care provider's instructions on monitoring your cholesterol and blood pressure. This information is not intended to replace advice given to you by your health care provider. Make sure you discuss any questions you have with your health care provider. Document Revised: 06/11/2020 Document Reviewed: 06/11/2020 Elsevier Patient Education  2023 [employer]  

## 2021-08-20 NOTE — Patient Instructions (Addendum)
Thank you, Mr.Junior P Matzen for allowing [place] to provide your care today. Today we discussed .  Diarrhea Please follow up with [facility] for your diarrhea. Continue the fiber supplements and take the nausea medicine, zofran as needed.   Anxiety Please take the cymbalta daily, it may take 4-6 weeks to work. I also placed a referral to behavioral health.    I have ordered the following labs for you:  Lab Orders  No laboratory test(s) ordered today    Referrals ordered today:    Referral Orders         Ambulatory referral to [place]      I have ordered the following medication/changed the following medications:   Stop the following medications: Medications Discontinued During This Encounter  Medication Reason   DULoxetine (CYMBALTA) 30 MG capsule Reorder     Start the following medications: Meds ordered this encounter  Medications   DULoxetine (CYMBALTA) 30 MG capsule    Sig: Take 1 capsule (30 mg total) by mouth daily.    Dispense:  90 capsule    Refill:  1   ondansetron (ZOFRAN-ODT) 4 MG disintegrating tablet    Sig: Take 1 tablet (4 mg total) by mouth every 8 (eight) hours as needed for nausea or vomiting.    Dispense:  20 tablet    Refill:  0     Follow up: 3 months   Remember: anxiety and diarrhea  Should you have any questions or concerns please call the internal medicine clinic at [phone].    Sanjuana Letters, D.O. [place]
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
## 2021-08-21 ENCOUNTER — Ambulatory Visit (HOSPITAL_COMMUNITY)
Admission: RE | Admit: 2021-08-21 | Discharge: 2021-08-21 | Disposition: A | Discharge status: Home / Self Care | Payer: Medicare Other | Source: Ambulatory Visit | Attending: Cardiovascular Disease | Admitting: Cardiovascular Disease

## 2021-08-21 DIAGNOSIS — R931 Abnormal findings on diagnostic imaging of heart and coronary circulation: Secondary | ICD-10-CM

## 2021-08-21 DIAGNOSIS — M47816 Spondylosis without myelopathy or radiculopathy, lumbar region: Secondary | ICD-10-CM | POA: Diagnosis not present

## 2021-08-21 MED ORDER — PROMETHAZINE HCL 25 MG PO TABS
25.0000 mg | ORAL_TABLET | Freq: Once | ORAL | Status: AC
  Administered 2021-08-20: 25 mg via ORAL

## 2021-08-21 NOTE — Progress Notes (Signed)
I reviewed the AWV findings with the provider who conducted the visit. I was present in the office suite and immediately available to provide assistance and direction throughout the time the service was provided.  

## 2021-08-21 NOTE — Assessment & Plan Note (Signed)
Patient follow-up today for chronic diarrhea.  In the past there has been some concern for chronic colitis which she has seen gastroenterology for.  He last saw him in November 2022.  The plan at that time was to repeat colonoscopy and if signs of chronic colitis would begin treatment.  He has not followed up with them since then.  We discussed the need to follow-up with them for further testing and possible treatment to help resolve this chronic concern of his.  He denies any changes in his symptoms.

## 2021-08-21 NOTE — Assessment & Plan Note (Signed)
>>  ASSESSMENT AND PLAN FOR CHRONIC DIARRHEA WRITTEN ON 08/21/2021  7:10 AM BY FINIS STALLION, MD  Patient follow-up today for chronic diarrhea.  In the past there has been some concern for chronic colitis which she has seen gastroenterology for.  He last saw him in November 2022.  The plan at that time was to repeat colonoscopy and if signs of chronic colitis would begin treatment.  He has not followed up with them since then.  We discussed the need to follow-up with them for further testing and possible treatment to help resolve this chronic concern of his.  He denies any changes in his symptoms.
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
## 2021-08-21 NOTE — Assessment & Plan Note (Addendum)
Continue Cymbalta daily.  Patient interested in counseling will refer to behavioral health.  He notes that he wakes up in the middle night and has difficult time falling back asleep.  What is helped in the past is eating Reese's cup and drinking some [facility].  We discussed healthy sleep habits when he wakes up such as staying off of his devices and if lying in bed for more than 15 minutes to get up and walk around.  We discussed transitioning from sweets to possibly yogurt in the evening.  Discussed probiotics as this may help with his chronic diarrhea.  Patient is agreement to this.

## 2021-08-22 ENCOUNTER — Telehealth: Payer: Self-pay | Admitting: Nurse Practitioner

## 2021-08-22 ENCOUNTER — Other Ambulatory Visit: Payer: Self-pay

## 2021-08-22 MED ORDER — DICYCLOMINE HCL 10 MG PO CAPS: 10.0000 mg | ORAL_CAPSULE | Freq: Three times a day (TID) | ORAL | 1 refills | Status: DC

## 2021-08-22 NOTE — Telephone Encounter (Signed)
Inbound call from patient returning call. 

## 2021-08-22 NOTE — Telephone Encounter (Signed)
The pt returned call with complaints of diarrhea, nausea and vomiting.  He has Bentyl that he takes but needs a refill. I have sent that to his pharmacy.  He also have zofran on hand to take every 8 hours as needed.  He was last seen by GM for same in November of 2022.  He has been scheduled to see Nicoletta Ba PA on 7/26 at 130 pm. He will follow a liquid diet for now and stay hydrated.  He was also advised to try imodium daily for the diarrhea.  The pt has been advised of the information and verbalized understanding.

## 2021-08-22 NOTE — Telephone Encounter (Signed)
Called and spoke with pt and he wanted to know about his upcoming appt and I stated to pt that at the appt, results from all prior imaging would be discussed at that appt and he verbalized understanding. Nothing further needed.

## 2021-08-24 DIAGNOSIS — J441 Chronic obstructive pulmonary disease with (acute) exacerbation: Secondary | ICD-10-CM | POA: Diagnosis not present

## 2021-08-25 DIAGNOSIS — J432 Centrilobular emphysema: Secondary | ICD-10-CM | POA: Diagnosis not present

## 2021-08-26 ENCOUNTER — Encounter (HOSPITAL_COMMUNITY)
Admission: RE | Admit: 2021-08-26 | Discharge: 2021-08-26 | Disposition: A | Discharge status: Home / Self Care | Payer: Medicare Other | Source: Ambulatory Visit | Attending: Nurse Practitioner | Admitting: Nurse Practitioner

## 2021-08-26 DIAGNOSIS — R911 Solitary pulmonary nodule: Secondary | ICD-10-CM | POA: Insufficient documentation

## 2021-08-26 NOTE — Progress Notes (Signed)
Internal Medicine Clinic Attending  Case and documentation of Dr. Johnney Ou  at the time of the visit reviewed.  I reviewed the AWV findings.  I agree with the assessment, diagnosis, and plan of care documented in the AWV note.

## 2021-08-26 NOTE — Progress Notes (Signed)
Internal Medicine Clinic Attending  Case discussed with Dr. Katsadouros  At the time of the visit.  We reviewed the resident's history and exam and pertinent patient test results.  I agree with the assessment, diagnosis, and plan of care documented in the resident's note.  

## 2021-08-28 ENCOUNTER — Other Ambulatory Visit: Payer: Medicare Other

## 2021-08-28 ENCOUNTER — Ambulatory Visit (INDEPENDENT_AMBULATORY_CARE_PROVIDER_SITE_OTHER): Payer: Medicare Other | Admitting: Physician Assistant

## 2021-08-28 ENCOUNTER — Encounter: Payer: Self-pay | Admitting: Physician Assistant

## 2021-08-28 VITALS — BP 104/66 | HR 60 | Ht 69.0 in | Wt 163.0 lb

## 2021-08-28 DIAGNOSIS — Z792 Long term (current) use of antibiotics: Secondary | ICD-10-CM | POA: Diagnosis not present

## 2021-08-28 DIAGNOSIS — K529 Noninfective gastroenteritis and colitis, unspecified: Secondary | ICD-10-CM

## 2021-08-28 MED ORDER — DICYCLOMINE HCL 10 MG PO CAPS: ORAL_CAPSULE | ORAL | 6 refills | Status: DC

## 2021-08-28 MED ORDER — PANTOPRAZOLE SODIUM 40 MG PO TBEC: 40.0000 mg | DELAYED_RELEASE_TABLET | Freq: Every day | ORAL | 6 refills | Status: DC

## 2021-08-28 NOTE — Progress Notes (Signed)
Subjective:    Patient ID: [name], male    DOB: [birth year], 66 y.o.   MRN: [id number]  HPI [name] is a pleasant 66 year old African-American male, established with Dr. Stefani Dama Roddy.  He was last seen here in November 2022, at that time with symptoms felt consistent with LPR, and also was complaining of chronic diarrhea and generalized abdominal discomfort. He had undergone colonoscopy in 2020 with finding of some granularity at the ileocecal valve, patchy moderate inflammation in the transverse colon hepatic flexure and right colon.  There was also patchy mild inflammation in the sigmoid and descending colon.  Biopsies showed patchy mildly active chronic colitis, biopsies from the colon and rectum showed no evidence of active colitis.  2 small hyperplastic polyps were removed. At the time of the last office visit he was having 6 or 7 loose bowel movements per day. Inflammatory markers were checked and both normal, fecal elastase was within normal limits, stool for C. difficile was negative, he had TSH done in March 2023 which was in normal limits, and iron studies at that time also normal. Plan was for repeat abdominal imaging, and probable colonoscopy should he have persistence of symptoms.  He tried Benefiber without any significant change in symptoms, and used Bentyl for a period of time which was somewhat helpful. He comes back in today noting that his symptoms actually had resolved for several months and then recurred about 3 months ago and are very similar to what he was having previously.  It sounds like he usually has 3-4 loose bowel movements throughout the morning hours and then less frequent bowel movements later in the day.  Some of these episodes are associated with urgency to the point of incontinence and says that sometimes he has an urge for bowel movement and goes to the bathroom and will not pass any stool.  He complains of intermittent nausea and generalized abdominal  discomfort/cramping. When he called a couple of weeks ago he was started back on Bentyl which she says most days he has been taking twice daily and feels that that does help somewhat.  He cannot take Imodium because he tried that in the past and says that he was constipated for 3 days after just 1 dose of Imodium. He is not having any melena or hematochezia. Appetite overall okay and weight stable. Interesting that he has been on rotating antibiotics from a pulmonary perspective for COPD and has taken courses of cefuroxime, azithromycin and doxycycline over this past year.  Apparently he ran out and says he has been off of antibiotics over the past 6 weeks or so with no change in his symptoms.  No other new medications.  He has been on steroids frequently as well. He has just recently undergone a coronary CT after some complaints of chest pain-Per report CAD RADS 3 concern for obstructive coronary artery disease particularly in the LAD.  He had negative FFR CT except most distal LAD.  He has not had follow-up with cardiology as yet.  Review of Systems Pertinent positive and negative review of systems were noted in the above HPI section.  All other review of systems was otherwise negative.   Outpatient Encounter Medications as of 08/28/2021  Medication Sig   acetaminophen (TYLENOL) 500 MG tablet Take 500 mg by mouth every 6 (six) hours as needed for mild pain or headache.    albuterol (PROVENTIL HFA) 108 (90 Base) MCG/ACT inhaler INHALE 2 PUFFS BY MOUTH EVERY 6 HOURS AS  NEEDED FOR WHEEZING OR SHORTNESS OF BREATH   albuterol (PROVENTIL) (2.5 MG/3ML) 0.083% nebulizer solution Take 3 mLs (2.5 mg total) by nebulization every 6 (six) hours as needed for wheezing or shortness of breath.   azithromycin (ZITHROMAX) 250 MG tablet 250 mg once daily for 5 days (March, June, September, December)   carboxymethylcellulose (REFRESH PLUS) 0.5 % SOLN Place 1 drop into both eyes 3 (three) times daily as needed (dry  eyes).   cefUROXime (CEFTIN) 250 MG tablet 250 mg twice a day for 7 days (February, May, August, November)   cefUROXime (CEFTIN) 250 MG tablet Take 1 tablet (250 mg total) by mouth 2 (two) times daily with a meal.   doxycycline (VIBRA-TABS) 100 MG tablet 100 mg twice a day for 7 days (April, July, October, January)   DULoxetine (CYMBALTA) 30 MG capsule Take 1 capsule (30 mg total) by mouth daily.   eszopiclone (LUNESTA) 1 MG TABS tablet Take 1 tablet (1 mg total) by mouth at bedtime as needed for sleep. Take immediately before bedtime   fexofenadine (ALLEGRA ALLERGY) 180 MG tablet Take 1 tablet (180 mg total) by mouth daily.   FLUoxetine (PROZAC) 10 MG capsule Take 10 mg by mouth daily as needed (Depression).   fluticasone (FLONASE) 50 MCG/ACT nasal spray Place 2 sprays into both nostrils daily.    Fluticasone-Umeclidin-Vilant (TRELEGY ELLIPTA) 100-62.5-25 MCG/ACT AEPB Inhale 1 puff into the lungs daily.   Guaifenesin 200 MG/5ML LIQD Take 10 mLs (400 mg total) by mouth every 6 (six) hours as needed.   hydrOXYzine (ATARAX) 25 MG tablet Take 1 tablet (25 mg total) by mouth every 6 (six) hours as needed for anxiety (or sleep).   ketoconazole (NIZORAL) 2 % shampoo Apply 1 application topically 2 (two) times a week.   meloxicam (MOBIC) 7.5 MG tablet Take 7.5 mg by mouth daily as needed for pain.   methocarbamol (ROBAXIN) 500 MG tablet Take 500 mg by mouth daily.   naproxen sodium (ALEVE) 220 MG tablet Take 220 mg by mouth daily as needed.   nitroGLYCERIN (NITROSTAT) 0.4 MG SL tablet Place 1 tablet (0.4 mg total) under the tongue every 5 (five) minutes as needed for chest pain.   ondansetron (ZOFRAN-ODT) 4 MG disintegrating tablet Take 1 tablet (4 mg total) by mouth every 8 (eight) hours as needed for nausea or vomiting.   oxyCODONE (ROXICODONE) 5 MG immediate release tablet Take 1 tablet (5 mg total) by mouth every 6 (six) hours as needed for up to 15 doses for breakthrough pain.   OXYGEN Inhale 2 L  into the lungs at bedtime.   predniSONE (DELTASONE) 10 MG tablet Take 1 tablet (10 mg total) by mouth daily with breakfast.   pregabalin (LYRICA) 100 MG capsule Take 100 mg by mouth daily.   promethazine (PHENERGAN) 12.5 MG tablet Take 1 tablet (12.5 mg total) by mouth every 4 (four) hours as needed for nausea or vomiting.   Simethicone 125 MG TABS Take 1 tablet (125 mg total) by mouth 3 (three) times daily as needed.   tamsulosin (FLOMAX) 0.4 MG CAPS capsule Take 0.4 mg by mouth daily.   [DISCONTINUED] dicyclomine (BENTYL) 10 MG capsule Take 1 capsule (10 mg total) by mouth 4 (four) times daily -  before meals and at bedtime.   [DISCONTINUED] pantoprazole (PROTONIX) 40 MG tablet Take 1 tablet (40 mg total) by mouth daily.   dicyclomine (BENTYL) 10 MG capsule Take 1 capsule three to four times daily for diarrhea   pantoprazole (PROTONIX)  40 MG tablet Take 1 tablet (40 mg total) by mouth daily.   sucralfate (CARAFATE) 1 g tablet Take 1 tablet (1 g total) by mouth 4 (four) times daily -  with meals and at bedtime.   [DISCONTINUED] metoprolol tartrate (LOPRESSOR) 100 MG tablet Take 1 tablet 2 hours prior to procedure.   No facility-administered encounter medications on file as of 08/28/2021.   No Known Allergies Patient Active Problem List   Diagnosis Date Noted   Aftercare for long-term (current) use of antibiotics 08/28/2021   Nodule of left lung 07/10/2021   Acute kidney injury ([place]) 05/03/2021   Anxiety 05/01/2021   Hyperbilirubinemia 05/01/2021   Cramping of hands 05/01/2021   Physical deconditioning 04/16/2021   Insomnia 03/28/2021   Abdominal cramping 12/14/2020   Generalized abdominal pain 12/14/2020   Chronic diarrhea 12/14/2020   Globus sensation 12/14/2020   Frequent bowel movements 12/13/2020   Dyspnea on exertion 12/02/2020   Bilateral lower extremity edema 12/02/2020   Hypertension 12/02/2020   Aortic atherosclerosis ([place]) 11/22/2020   Chronic respiratory failure with  hypoxia ([place]) 08/17/2020   Dysphagia 05/22/2020   Polypharmacy 04/27/2020   Chronic pain syndrome 12/13/2019   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   Renal mass 06/01/2019   Nocturnal hypoxemia 01/27/2019   Laryngopharyngeal reflux (LPR) 02/13/2017   GERD (gastroesophageal reflux disease) 02/09/2017   Allergic rhinitis 08/12/2016   Nodule of right lung 06/27/2015   Chest pain of uncertain etiology 94/17/4081   Spondylosis, cervical, 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   Lung cancer ([place]) 07/03/2011   CAD (coronary artery disease) 06/05/2011   Lung mass 05/13/2011   Chronic headaches 04/08/2011   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   Social History   Socioeconomic History   Marital status: Divorced    Spouse name: Not on file   Number of children: 2   Years of education: 7   Highest education level: 7th grade  Occupational History   Occupation: UNEMPLOYED    Comment: Disabled  Tobacco Use   Smoking status: Former    Packs/day: 1.00    Years: 45.00    Total pack years: 45.00    Types: Cigarettes    Start date: 1974    Quit date: 06/01/2019    Years since quitting: 2.2   Smokeless tobacco: Never  Vaping Use   Vaping Use: Former  Substance and Sexual Activity   Alcohol use: No    Alcohol/week: 0.0 standard drinks of alcohol    Comment: no alcohol  since 1990's   Drug use: No    Types: Cocaine    Comment: none since 2013 Recovering addict    Sexual activity: Yes  Other Topics Concern   Not on file  Social History Narrative   Patient lives in [place] support group for recovering addicts. 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   Social Determinants of Health   Financial Resource Strain: Low Risk  (08/20/2021)   Overall Financial Resource Strain (CARDIA)    Difficulty of Paying Living Expenses: Not very hard  Food Insecurity: No Food Insecurity (08/20/2021)   Hunger Vital Sign    Worried About Running Out of Food in the Last Year:  Never true    Ran Out of Food in the Last Year: Never true  Transportation Needs: Unmet Transportation Needs (08/20/2021)   PRAPARE - Hydrologist (Medical): Yes    Lack of Transportation (Non-Medical): No  Physical Activity: Not on file  Stress: Stress  Concern Present (08/20/2021)   [place]    Feeling of Stress : To some extent  Social Connections: Unknown (08/20/2021)   Social Connection and Isolation Panel [NHANES]    Frequency of Communication with Friends and Family: Once a week    Frequency of Social Gatherings with Friends and Family: Never    Attends Religious Services: Never    Marine scientist or Organizations: No    Attends Archivist Meetings: Never    Marital Status: Patient refused  Intimate Partner Violence: Not At Risk (08/20/2021)   Humiliation, Afraid, Rape, and Kick questionnaire    Fear of Current or Ex-Partner: No    Emotionally Abused: No    Physically Abused: No    Sexually Abused: No    [name]'s family history is not on file. He was adopted.      Objective:    Vitals:   08/28/21 1340  BP: 104/66  Pulse: 60    Physical Exam Well-developed well-nourished AA male in no acute distress.  Height, OZDGUY,403 BMI 24.07  HEENT; nontraumatic normocephalic, EOMI, PE R LA, sclera anicteric. Oropharynx;not examined Neck; supple, no JVD Cardiovascular; regular rate and rhythm with S1-S2, no murmur rub or gallop Pulmonary; Clear bilaterally, decreased breath sounds bilaterally, on O2 2 L nasal cannula Abdomen; soft, he has some mild generalized tenderness, no guarding, no focal tenderness nondistended, no palpable mass or hepatosplenomegaly, bowel sounds are active Rectal; not done today Skin; benign exam, no jaundice rash or appreciable lesions Extremities; no clubbing cyanosis or edema skin warm and dry Neuro/Psych; alert and oriented x4, grossly  nonfocal mood and affect appropriate        Assessment & Plan:   #42 66 year old African-American male with recurrent diarrhea now persisting over the past 3 months. Work-up fall 2022 for similar diarrhea with negative inflammatory markers negative fecal elastase, negative C. Difficile. He did have colonoscopy in 2020 with biopsy findings of a patchy mildly active chronic colitis involving the right colon, nonspecific-he has not had any specific therapy for that  He also has been on rotating courses of antibiotics for severe COPD over the past several months  Rule out antibiotic associated diarrhea, doubt C. difficile but need to rule out, rule out symptoms secondary to nonspecific right-sided colitis.  #2 severe COPD on O2 2 L chronically 3.  Coronary artery disease-recent coronary CT-has not had cardiology follow-up as yet #4 history of lung cancer status postresection 5.  LPR and chronic GERD stable 5.  Chronic pain syndrome 6.  History of renal cell cancer status post partial left nephrectomy  Plan; patient also related he has been off of Protonix at the advice of pulmonary when he mentioned he was having diarrhea.  Advised him to go back on Protonix 40 mg p.o. every morning Advised he take Bentyl on a regular basis 10 mg, first dose early a.m. prior to eating, repeat midday and at dinner if possible. Check fecal calprotectin, C. difficile PCR and GI path panel Meds were reviewed, no other obvious offenders If stool studies are negative-consider trial of Florastor twice daily, and trial of Lialda. Patient is very reluctant to pursue another colonoscopy, we may also need to do further abdominal imaging.   [name] S Jerrik Housholder PA-C 08/28/2021   Cc: Lacinda Axon, MD

## 2021-08-28 NOTE — Progress Notes (Signed)
Attending Physician's Attestation   I have reviewed the chart.   I agree with the Advanced Practitioner's note, impression, and recommendations with any updates as below. Agree with stool studies and probiotics and initiation of the although due to chronic colitis that was noted back on 2020.  In the setting of multiple rounds of antibiotics, query small intestine bacterial overgrowth as well.  Consider breath testing since he has been off antibiotics for a period in time versus empiric rifaximin therapy.  Okay to hold on colonoscopy for now but if symptoms are persisting to consider imaging and potential colonoscopy in future.   Justice Britain, MD [facility] Advanced Endoscopy Office # [phone]

## 2021-08-28 NOTE — Patient Instructions (Signed)
If you are age 66 or older, your body mass index should be between 23-30. Your Body mass index is 24.07 kg/m. If this is out of the aforementioned range listed, please consider follow up with your Primary Care Provider. ________________________________________________________  The [facility] providers would like to encourage you to use [facility] to communicate with providers for non-urgent requests or questions.  Due to long hold times on the telephone, sending your provider a message by [facility] may be a faster and more efficient way to get a response.  Please allow 48 business hours for a response.  Please remember that this is for non-urgent requests.  _______________________________________________________  Your provider has requested that you go to the basement level for lab work before leaving today. Press "B" on the elevator. The lab is located at the first door on the left as you exit the elevator.  Continue Dicyclomine 10 mg 1 capsule 3-4 times daily   Re-Start Pantoprazole 40 mg 1 tablet prior to breakfast  Follow up pending  Thank you for entrusting me with your care and choosing [facility].  Amy Esterwood, PA-C

## 2021-08-29 ENCOUNTER — Telehealth: Payer: Self-pay

## 2021-08-29 ENCOUNTER — Other Ambulatory Visit: Payer: Self-pay

## 2021-08-29 DIAGNOSIS — I2510 Atherosclerotic heart disease of native coronary artery without angina pectoris: Secondary | ICD-10-CM

## 2021-08-29 DIAGNOSIS — Z79899 Other long term (current) drug therapy: Secondary | ICD-10-CM

## 2021-08-29 NOTE — Telephone Encounter (Signed)
Spoke with pt. Pt was notified of CTA results. Pt will complete fasting lab work next week and follow up 09/10/2021 with Diona Browner, DNP.

## 2021-08-30 ENCOUNTER — Ambulatory Visit (HOSPITAL_COMMUNITY)
Admission: RE | Admit: 2021-08-30 | Discharge: 2021-08-30 | Disposition: A | Discharge status: Home / Self Care | Payer: Medicare Other | Source: Ambulatory Visit | Attending: Nurse Practitioner | Admitting: Nurse Practitioner

## 2021-08-30 DIAGNOSIS — R911 Solitary pulmonary nodule: Secondary | ICD-10-CM | POA: Insufficient documentation

## 2021-08-30 DIAGNOSIS — R918 Other nonspecific abnormal finding of lung field: Secondary | ICD-10-CM | POA: Diagnosis not present

## 2021-08-30 LAB — GLUCOSE, CAPILLARY: Glucose-Capillary: 92 mg/dL (ref 70–99)

## 2021-08-30 MED ORDER — FLUDEOXYGLUCOSE F - 18 (FDG) INJECTION
8.2000 | Freq: Once | INTRAVENOUS | Status: AC
  Administered 2021-08-30: 8.2 via INTRAVENOUS

## 2021-09-02 ENCOUNTER — Other Ambulatory Visit: Payer: Self-pay

## 2021-09-02 DIAGNOSIS — I2510 Atherosclerotic heart disease of native coronary artery without angina pectoris: Secondary | ICD-10-CM

## 2021-09-02 DIAGNOSIS — Z79899 Other long term (current) drug therapy: Secondary | ICD-10-CM

## 2021-09-04 ENCOUNTER — Other Ambulatory Visit: Payer: Medicare Other

## 2021-09-04 DIAGNOSIS — K529 Noninfective gastroenteritis and colitis, unspecified: Secondary | ICD-10-CM

## 2021-09-04 DIAGNOSIS — Z792 Long term (current) use of antibiotics: Secondary | ICD-10-CM

## 2021-09-06 LAB — GI PROFILE, STOOL, PCR

## 2021-09-10 ENCOUNTER — Ambulatory Visit: Payer: Medicare Other | Admitting: Nurse Practitioner

## 2021-09-10 ENCOUNTER — Encounter: Payer: Self-pay | Admitting: Emergency Medicine

## 2021-09-10 ENCOUNTER — Ambulatory Visit (INDEPENDENT_AMBULATORY_CARE_PROVIDER_SITE_OTHER): Payer: Medicare Other | Admitting: Emergency Medicine

## 2021-09-10 DIAGNOSIS — J9611 Chronic respiratory failure with hypoxia: Secondary | ICD-10-CM

## 2021-09-10 DIAGNOSIS — R9389 Abnormal findings on diagnostic imaging of other specified body structures: Secondary | ICD-10-CM | POA: Diagnosis not present

## 2021-09-10 DIAGNOSIS — J432 Centrilobular emphysema: Secondary | ICD-10-CM | POA: Diagnosis not present

## 2021-09-10 NOTE — Progress Notes (Signed)
Subjective:    Patient ID: [name], male    DOB: [birth year], 66 y.o.   MRN: [id number]  COPD He complains of cough and shortness of breath. There is no wheezing. Pertinent negatives include no ear pain, fever, headaches, postnasal drip, rhinorrhea, sneezing, sore throat or trouble swallowing. His past medical history is significant for COPD.   HPI     ROV 03/01/21 --[name] is 70 and has a history of very severe COPD, adenocarcinomas of the lung for which she has undergone bilateral surgical resections.  He has associated hypoxemic respiratory failure.  We have been following pulmonary nodular disease including a stable right lower lobe 12 x 6 mm nodule, now a new 7 mm left lower lobe nodule.  He underwent CT chest as below Currently managed on Trelegy, prednisone 10 mg daily, rotating antibiotics (cefuroxime, azithromycin, doxycycline). On omeprazole, fluticasone nasal spray, Allegra for contributors to cough He is having daily cough, some throat irritation. He has intermittently seen some scant hemoptysis beginning 2 months ago. His throat sx have been present for several months. He saw ENT in Dec 22 >> lary w no evidence mass or any other abnormality  CT chest 02/26/2021 reviewed by me, shows a new 14 mm nodular opacity in the right lower lobe.  The previous 12 mm right lower lobe nodule has resolved, 7 mm nodule identified 08/2020 has resolved.   ROV 09/10/21 --follow-up visit 66 year old gentleman with history of former tobacco use and a severe associated COPD.  He is also had bilateral surgical resections of adenocarcinoma of the lung.  He has associated hypoxemic respiratory failure.  He has residual pulmonary nodular disease and has been followed with serial imaging. Managed on Trelegy, prednisone 10 mg and rotating antibiotics (azithromycin, cefuroxime, doxycycline) He reports   CT chest 08/12/2021 reviewed by me shows centrilobular emphysema and scattered scarring.  There is an  enlarging left lower lobe pulmonary nodule 10 mm (previously 8 mm), new bilateral pulmonary nodules up to 12 mm in the posterior left lower lobe  PET scan 08/30/2021 reviewed by me showed no evidence of hypermetabolic mediastinal or hilar adenopathy.  There are multiple hypermetabolic left lower lobe pulmonary nodules including a 1.2 cm nodule that was very hypermetabolic with an SUV max of 12.7.  There are also small minimally hypermetabolic nodules noted on the right.  Question inflammatory                                                                                                                                          Review of Systems  Constitutional:  Negative for fever and unexpected weight change.  HENT:  Positive for congestion. Negative for dental problem, ear pain, nosebleeds, postnasal drip, rhinorrhea, sinus pressure, sneezing, sore throat and trouble swallowing.   Eyes:  Negative for redness and itching.  Respiratory:  Positive for cough and shortness of breath.  Negative for chest tightness and wheezing.   Cardiovascular:  Positive for palpitations. Negative for leg swelling.  Gastrointestinal:  Negative for nausea and vomiting.  Genitourinary:  Negative for dysuria.  Musculoskeletal:  Negative for joint swelling.  Skin:  Negative for rash.  Neurological:  Negative for headaches.  Hematological:  Does not bruise/bleed easily.  Psychiatric/Behavioral:  Positive for dysphoric mood. The patient is nervous/anxious.         Objective:   Physical Exam  Vitals:   09/10/21 1511  BP: 120/68  Pulse: (!) 52  Temp: 97.9 F (36.6 C)  TempSrc: Oral  SpO2: 94%  Weight: 159 lb 12.8 oz (72.5 kg)  Height: 5\' 9"  (1.753 m)   Gen: Pleasant, well-nourished, in no distress, somewhat depressed affect  ENT: No lesions,  mouth clear,  oropharynx clear, no postnasal drip  Neck: No JVD, no stridor  Lungs: No use of accessory muscles, clear without rales or rhonchi, no  wheeze  Cardiovascular: RRR, heart sounds normal, no murmur or gallops, no peripheral edema  Musculoskeletal: no deformities   Neuro: alert, non focal  Skin: Warm, no lesions or rashes     Assessment & Plan:  COPD with emphysema ([place]) Continue your inhaled medications as you have been using them Continue prednisone and rotating antibiotics as you have been doing them.  Abnormal CT of the chest Bilateral pulmonary nodules but most notable on the left, very small on the right.  They have come on quickly but given his history concerning for possible primary lung cancer.  Could be inflammatory.  We agreed to repeat his CT chest in October to look for interval change.  If they persist, grow then we will arrange for navigational bronchoscopy.  Chronic respiratory failure with hypoxia ([place]) Continue current oxygen    Baltazar Apo, MD, PhD 09/10/2021, 3:34 PM [facility] [phone] or if no answer [phone]

## 2021-09-10 NOTE — Assessment & Plan Note (Signed)
Continue current oxygen 

## 2021-09-10 NOTE — Assessment & Plan Note (Signed)
Bilateral pulmonary nodules but most notable on the left, very small on the right.  They have come on quickly but given his history concerning for possible primary lung cancer.  Could be inflammatory.  We agreed to repeat his CT chest in October to look for interval change.  If they persist, grow then we will arrange for navigational bronchoscopy.

## 2021-09-10 NOTE — Addendum Note (Signed)
Addended by: Gavin Potters R on: 09/10/2021 03:46 PM   Modules accepted: Orders

## 2021-09-10 NOTE — Assessment & Plan Note (Addendum)
>>  ASSESSMENT AND PLAN FOR COPD WITH EMPHYSEMA (HCC) WRITTEN ON 09/10/2021  3:32 PM BY Camron Monday, Les Pou, MD  Continue your inhaled medications as you have been using them Continue prednisone and rotating antibiotics as you have been doing them.  >>ASSESSMENT AND PLAN FOR CHRONIC RESPIRATORY FAILURE WITH HYPOXIA (HCC) WRITTEN ON 09/10/2021  3:34 PM BY Edgel Degnan, Les Pou, MD  Continue current oxygen

## 2021-09-10 NOTE — Patient Instructions (Addendum)
We reviewed your CT scan and PET scan today. We will plan to repeat your CT scan of the chest in early October 2023. Continue your inhaled medications as you have been using them Continue prednisone and rotating antibiotics as you have been doing them. Continue your oxygen at all times Follow Dr. Lamonte Sakai in October after your CT scan so we can review the results together.  Depending on that scan we will decide whether to arrange bronchoscopy to biopsy pulmonary nodules.

## 2021-09-11 ENCOUNTER — Telehealth: Payer: Self-pay

## 2021-09-11 ENCOUNTER — Other Ambulatory Visit: Payer: Medicare Other

## 2021-09-11 DIAGNOSIS — Z792 Long term (current) use of antibiotics: Secondary | ICD-10-CM | POA: Diagnosis not present

## 2021-09-11 DIAGNOSIS — K529 Noninfective gastroenteritis and colitis, unspecified: Secondary | ICD-10-CM

## 2021-09-11 NOTE — Telephone Encounter (Signed)
Amy Spoke with the patient. He is turning in additional stool specimens today. Reports he continues to have diarrhea and abdominal discomfort. He complains of nausea. Is there anything he can take to help with the symptoms?

## 2021-09-11 NOTE — Telephone Encounter (Signed)
The patient is returning my call about his lab results. See the GI pathogen panel for further information. Normal GI pathogen panel.

## 2021-09-11 NOTE — Telephone Encounter (Signed)
Inbound call from patient returning call. Please give a call back to further advise.  Thank you

## 2021-09-11 NOTE — Telephone Encounter (Signed)
Discussed use of Bentyl and OTC Imodium with the patient. He is reluctant to take Imodium due to past experience with constipation. He is agreeable to using Bentyl up to 3 times daily. Encouraged to maintain hydration.

## 2021-09-11 NOTE — Telephone Encounter (Signed)
Beth called the pt today

## 2021-09-12 DIAGNOSIS — H00011 Hordeolum externum right upper eyelid: Secondary | ICD-10-CM | POA: Diagnosis not present

## 2021-09-12 DIAGNOSIS — H1013 Acute atopic conjunctivitis, bilateral: Secondary | ICD-10-CM | POA: Diagnosis not present

## 2021-09-12 DIAGNOSIS — H5213 Myopia, bilateral: Secondary | ICD-10-CM | POA: Diagnosis not present

## 2021-09-13 LAB — CALPROTECTIN, FECAL: Calprotectin, Fecal: 575 ug/g — ABNORMAL HIGH (ref 0–120)

## 2021-09-13 LAB — CLOSTRIDIUM DIFFICILE BY PCR: Toxigenic C. Difficile by PCR: NEGATIVE

## 2021-09-17 ENCOUNTER — Other Ambulatory Visit: Payer: Self-pay

## 2021-09-17 DIAGNOSIS — M961 Postlaminectomy syndrome, not elsewhere classified: Secondary | ICD-10-CM | POA: Diagnosis not present

## 2021-09-17 DIAGNOSIS — G8912 Acute post-thoracotomy pain: Secondary | ICD-10-CM | POA: Diagnosis not present

## 2021-09-17 DIAGNOSIS — M47816 Spondylosis without myelopathy or radiculopathy, lumbar region: Secondary | ICD-10-CM | POA: Diagnosis not present

## 2021-09-17 DIAGNOSIS — M5416 Radiculopathy, lumbar region: Secondary | ICD-10-CM | POA: Diagnosis not present

## 2021-09-17 DIAGNOSIS — M542 Cervicalgia: Secondary | ICD-10-CM | POA: Diagnosis not present

## 2021-09-17 DIAGNOSIS — M7918 Myalgia, other site: Secondary | ICD-10-CM | POA: Diagnosis not present

## 2021-09-17 MED ORDER — MESALAMINE 1.2 G PO TBEC: 2.4000 g | DELAYED_RELEASE_TABLET | Freq: Two times a day (BID) | ORAL | 4 refills | Status: DC

## 2021-09-19 ENCOUNTER — Other Ambulatory Visit: Payer: Self-pay

## 2021-09-19 ENCOUNTER — Emergency Department (HOSPITAL_BASED_OUTPATIENT_CLINIC_OR_DEPARTMENT_OTHER)
Admission: EM | Admit: 2021-09-19 | Discharge: 2021-09-19 | Disposition: A | Discharge status: Home / Self Care | Payer: Medicare Other | Attending: Emergency Medicine | Admitting: Emergency Medicine

## 2021-09-19 ENCOUNTER — Encounter (HOSPITAL_BASED_OUTPATIENT_CLINIC_OR_DEPARTMENT_OTHER): Payer: Self-pay | Admitting: Emergency Medicine

## 2021-09-19 DIAGNOSIS — G8929 Other chronic pain: Secondary | ICD-10-CM | POA: Diagnosis not present

## 2021-09-19 DIAGNOSIS — M5459 Other low back pain: Secondary | ICD-10-CM | POA: Diagnosis not present

## 2021-09-19 DIAGNOSIS — M545 Low back pain, unspecified: Secondary | ICD-10-CM | POA: Insufficient documentation

## 2021-09-19 MED ORDER — OXYCODONE-ACETAMINOPHEN 5-325 MG PO TABS
1.0000 | ORAL_TABLET | Freq: Once | ORAL | Status: AC
  Administered 2021-09-19: 1 via ORAL
  Filled 2021-09-19: qty 1

## 2021-09-19 NOTE — Discharge Instructions (Addendum)
You have been seen today for your complaint of chronic low back pain. Your discharge medications include the tramadol and muscle relaxant and prednisone that you are pain management provider gave you.[name] Home care instructions are as follows:  You should try to eat foods that you can take your medication as prescribed.  You should move slowly, perform gentle range of motion exercises, alternate heat and ice on the back for pain. Follow up with: Your pain management doctor as soon as possible. Please seek immediate medical care if you develop any of the following symptoms: You have weakness or numbness in one or both of your legs or feet. You have trouble controlling your bladder or your bowels. You have severe back pain and have any of the following: Nausea or vomiting. Pain in your abdomen. Shortness of breath or you faint. At this time there does not appear to be the presence of an emergent medical condition, however there is always the potential for conditions to change. Please read and follow the below instructions.  Do not take your medicine if  develop an itchy rash, swelling in your mouth or lips, or difficulty breathing; call 911 and seek immediate emergency medical attention if this occurs.  You may review your lab tests and imaging results in their entirety on your MyChart account.  Please discuss all results of fully with your primary care provider and other specialist at your follow-up visit.  Note: Portions of this text may have been transcribed using voice recognition software. Every effort was made to ensure accuracy; however, inadvertent computerized transcription errors may still be present.

## 2021-09-19 NOTE — ED Provider Notes (Signed)
[facility] Provider Note   CSN: [id number] Arrival date & time: 09/19/21  1339     History  Chief Complaint  Patient presents with   Back Pain    [name] is a 66 y.o. male.  With a history of chronic low back pain who sees pain management.  He presents to the ED for evaluation of 1 week of worsening back pain.  He reports he was seen for this 2 days ago by his pain management provider who prescribed him tramadol and prednisone.  He has not been able to take the prednisone because he has not wanted to walk to the kitchen to make food.  He has taken the tramadol 1 time.  States the pain is a 5 out of 10 at rest, 10 out of 10 with movement.  Has not tried ice, heat or gentle range of motion exercises.  Denies bowel or bladder incontinence.  Denies fevers, chills, night sweats, IV drug use.  States he gets occasional numbness down each leg, this is not new.  Denies chest pain, shortness of breath   Back Pain      Home Medications Prior to Admission medications   Medication Sig Start Date End Date Taking? Authorizing Provider  acetaminophen (TYLENOL) 500 MG tablet Take 500 mg by mouth every 6 (six) hours as needed for mild pain or headache.     [provider]  albuterol (PROVENTIL HFA) 108 (90 Base) MCG/ACT inhaler INHALE 2 PUFFS BY MOUTH EVERY 6 HOURS AS NEEDED FOR WHEEZING OR SHORTNESS OF BREATH 06/04/21   Collene Gobble, MD  albuterol (PROVENTIL) (2.5 MG/3ML) 0.083% nebulizer solution Take 3 mLs (2.5 mg total) by nebulization every 6 (six) hours as needed for wheezing or shortness of breath. 06/18/21   Parrett, Fonnie Mu, NP  azithromycin (ZITHROMAX) 250 MG tablet 250 mg once daily for 5 days (March, June, September, December) 03/01/21   Collene Gobble, MD  carboxymethylcellulose (REFRESH PLUS) 0.5 % SOLN Place 1 drop into both eyes 3 (three) times daily as needed (dry eyes).    [provider]  cefUROXime (CEFTIN) 250 MG tablet 250 mg  twice a day for 7 days (February, May, August, November) 03/01/21   Collene Gobble, MD  cefUROXime (CEFTIN) 250 MG tablet Take 1 tablet (250 mg total) by mouth 2 (two) times daily with a meal. 05/30/21   Parrett, Fonnie Mu, NP  dicyclomine (BENTYL) 10 MG capsule Take 1 capsule three to four times daily for diarrhea 08/28/21   Esterwood, Amy S, PA-C  doxycycline (VIBRA-TABS) 100 MG tablet 100 mg twice a day for 7 days (April, July, October, January) 08/07/21   Clayton Bibles, NP  DULoxetine (CYMBALTA) 30 MG capsule Take 1 capsule (30 mg total) by mouth daily. 08/20/21   Katsadouros, Vasilios, MD  eszopiclone (LUNESTA) 1 MG TABS tablet Take 1 tablet (1 mg total) by mouth at bedtime as needed for sleep. Take immediately before bedtime 03/29/19   Sherrilyn Rist A, MD  fexofenadine (ALLEGRA ALLERGY) 180 MG tablet Take 1 tablet (180 mg total) by mouth daily. 12/13/20   Sanjuan Dame, MD  FLUoxetine (PROZAC) 10 MG capsule Take 10 mg by mouth daily as needed (Depression). 12/06/19   [provider]  fluticasone (FLONASE) 50 MCG/ACT nasal spray Place 2 sprays into both nostrils daily.  12/06/19   [provider]  Fluticasone-Umeclidin-Vilant (TRELEGY ELLIPTA) 100-62.5-25 MCG/ACT AEPB Inhale 1 puff into the lungs daily. 06/04/21   Byrum,  Rose Fillers, MD  Guaifenesin 200 MG/5ML LIQD Take 10 mLs (400 mg total) by mouth every 6 (six) hours as needed. 10/24/20   Martyn Ehrich, NP  hydrOXYzine (ATARAX) 25 MG tablet Take 1 tablet (25 mg total) by mouth every 6 (six) hours as needed for anxiety (or sleep). 03/26/21   Lacinda Axon, MD  ketoconazole (NIZORAL) 2 % shampoo Apply 1 application topically 2 (two) times a week. 08/14/20   [provider]  meloxicam (MOBIC) 7.5 MG tablet Take 7.5 mg by mouth daily as needed for pain. 01/06/20   [provider]  mesalamine (LIALDA) 1.2 g EC tablet Take 2 tablets (2.4 g total) by mouth 2 (two) times daily. 09/17/21   Esterwood, Amy S, PA-C   methocarbamol (ROBAXIN) 500 MG tablet Take 500 mg by mouth daily. 08/03/20   [provider]  naproxen sodium (ALEVE) 220 MG tablet Take 220 mg by mouth daily as needed.    [provider]  nitroGLYCERIN (NITROSTAT) 0.4 MG SL tablet Place 1 tablet (0.4 mg total) under the tongue every 5 (five) minutes as needed for chest pain. 08/19/21 11/17/21  Lenna Sciara, NP  ondansetron (ZOFRAN-ODT) 4 MG disintegrating tablet Take 1 tablet (4 mg total) by mouth every 8 (eight) hours as needed for nausea or vomiting. 08/20/21   Riesa Pope, MD  oxyCODONE (ROXICODONE) 5 MG immediate release tablet Take 1 tablet (5 mg total) by mouth every 6 (six) hours as needed for up to 15 doses for breakthrough pain. 01/16/21   Curatolo, Adam, DO  OXYGEN Inhale 2 L into the lungs at bedtime.    [provider]  pantoprazole (PROTONIX) 40 MG tablet Take 1 tablet (40 mg total) by mouth daily. 08/28/21   Esterwood, Amy S, PA-C  predniSONE (DELTASONE) 10 MG tablet Take 1 tablet (10 mg total) by mouth daily with breakfast. 06/18/21   Parrett, Fonnie Mu, NP  pregabalin (LYRICA) 100 MG capsule Take 100 mg by mouth daily. 04/29/19   [provider]  promethazine (PHENERGAN) 12.5 MG tablet Take 1 tablet (12.5 mg total) by mouth every 4 (four) hours as needed for nausea or vomiting. 06/01/19   Debbrah Alar, PA-C  Simethicone 125 MG TABS Take 1 tablet (125 mg total) by mouth 3 (three) times daily as needed. 06/06/20   Collene Gobble, MD  sucralfate (CARAFATE) 1 g tablet Take 1 tablet (1 g total) by mouth 4 (four) times daily -  with meals and at bedtime. 01/16/21 02/15/21  Curatolo, Adam, DO  tamsulosin (FLOMAX) 0.4 MG CAPS capsule Take 0.4 mg by mouth daily. 01/20/19   [provider]      Allergies    Patient has no known allergies.    Review of Systems   Review of Systems  Gastrointestinal:  Negative for constipation and diarrhea.  Genitourinary:  Negative for difficulty urinating,  frequency and urgency.  Musculoskeletal:  Positive for back pain. Negative for neck pain and neck stiffness.  All other systems reviewed and are negative.   Physical Exam Updated Vital Signs BP (!) 140/93   Pulse 70   Temp 98 F (36.7 C) (Oral)   Resp 16   Ht 5\' 9"  (1.753 m)   Wt 72.6 kg   SpO2 98%   BMI 23.63 kg/m  Physical Exam Vitals and nursing note reviewed.  Constitutional:      General: He is not in acute distress.    Appearance: Normal appearance. He is normal weight. He  is not ill-appearing.  HENT:     Head: Normocephalic and atraumatic.  Cardiovascular:     Pulses:          Dorsalis pedis pulses are 2+ on the right side and 2+ on the left side.  Pulmonary:     Effort: Pulmonary effort is normal. No respiratory distress.  Abdominal:     General: Abdomen is flat.  Musculoskeletal:     Cervical back: Neck supple. No tenderness.     Thoracic back: No tenderness.     Lumbar back: Tenderness present. Decreased range of motion. Positive right straight leg raise test and positive left straight leg raise test.     Comments: Sensation intact.  No weakness  Skin:    General: Skin is warm and dry.     Capillary Refill: Capillary refill takes less than 2 seconds.  Neurological:     General: No focal deficit present.     Mental Status: He is alert and oriented to person, place, and time.     Comments: Straight leg raise positive bilaterally.  Psychiatric:        Mood and Affect: Mood normal.        Behavior: Behavior normal.     ED Results / Procedures / Treatments   Labs (all labs ordered are listed, but only abnormal results are displayed) Labs Reviewed - No data to display  EKG None  Radiology No results found.  Procedures Procedures    Medications Ordered in ED Medications  oxyCODONE-acetaminophen (PERCOCET/ROXICET) 5-325 MG per tablet 1 tablet (1 tablet Oral Given 09/19/21 1450)    ED Course/ Medical Decision Making/ A&P                            Medical Decision Making This patient presents to the ED for concern of back pain, this involves an extensive number of treatment options, and is a complaint that carries with it a high risk of complications and morbidity.  The emergent differential diagnosis for back pain includes but is not limited to fracture, muscle strain, cauda equina, spinal stenosis. DDD, ankylosing spondylitis, acute ligamentous injury, disk herniation, spondylolisthesis, Epidural compression syndrome, metastatic cancer, transverse myelitis, vertebral osteomyelitis, diskitis, kidney stone, pyelonephritis, AAA, Perforated ulcer, Retrocecal appendicitis, pancreatitis, bowel obstruction, retroperitoneal hemorrhage or mass, meningitis.     Co morbidities that complicate the patient evaluation       Chronic low back pain  My initial workup includes  Additional history obtained from: Nursing notes from this visit. Previous records within EMR system   No labs   I did not order imaging.  Extensive history of imaging in the chart, no red flags  Cardiac Monitoring:       The patient was not maintained on a cardiac monitor. Consultations Obtained:  No consults  Patient is hemodynamically stable.  He has an extensive history of low back pain.  He sees pain management.  Most recently 2 days ago.  He was given tramadol and prednisone.  He has not taken the prednisone.  He has taken 1 dose of tramadol.  I gave 1 dose of oxycodone p.o. while in the ED.  He reports this brought the pain down for.  I advised that he should take his prescribed medication at home and eat a normal diet.  I also advised that he may perform gentle range of motion, alternating heat and ice.  I advised that he should follow-up with his pain  management provider soon as possible for further management and outpatient pain medications.  At this time there does not appear to be any evidence of an acute emergency medical condition and the patient appears  stable for discharge with appropriate outpatient follow up. Diagnosis was discussed with patient who verbalizes understanding of care plan and is agreeable to discharge. I have discussed return precautions with patient who verbalizes understanding. Patient encouraged to follow-up with their pain management provider within 1 week. All questions answered.  Patient's case discussed with Dr. Ashok Cordia who agrees with plan to discharge with follow-up.   Note: Portions of this report may have been transcribed using voice recognition software. Every effort was made to ensure accuracy; however, inadvertent computerized transcription errors may still be present.   Risk Prescription drug management.          Final Clinical Impression(s) / ED Diagnoses Final diagnoses:  Chronic bilateral low back pain without sciatica    Rx / DC Orders ED Discharge Orders     None         Roylene Reason, Hershal Coria 09/19/21 1543    Lajean Saver, MD 09/20/21 [phone]

## 2021-09-19 NOTE — ED Triage Notes (Signed)
Pt arrives to ED with c/o acute on chronic lower back pain x1 week. Pt denies injury and urinary/fecal continence.

## 2021-09-24 DIAGNOSIS — J441 Chronic obstructive pulmonary disease with (acute) exacerbation: Secondary | ICD-10-CM | POA: Diagnosis not present

## 2021-09-25 DIAGNOSIS — J432 Centrilobular emphysema: Secondary | ICD-10-CM | POA: Diagnosis not present

## 2021-10-01 DIAGNOSIS — M5416 Radiculopathy, lumbar region: Secondary | ICD-10-CM | POA: Diagnosis not present

## 2021-10-01 DIAGNOSIS — M5116 Intervertebral disc disorders with radiculopathy, lumbar region: Secondary | ICD-10-CM | POA: Diagnosis not present

## 2021-10-03 DIAGNOSIS — M47816 Spondylosis without myelopathy or radiculopathy, lumbar region: Secondary | ICD-10-CM | POA: Diagnosis not present

## 2021-10-03 DIAGNOSIS — M5416 Radiculopathy, lumbar region: Secondary | ICD-10-CM | POA: Diagnosis not present

## 2021-10-09 ENCOUNTER — Encounter: Payer: Self-pay | Admitting: Nurse Practitioner

## 2021-10-09 ENCOUNTER — Other Ambulatory Visit: Payer: Self-pay | Admitting: Student

## 2021-10-09 ENCOUNTER — Ambulatory Visit: Payer: Medicare Other | Attending: Nurse Practitioner | Admitting: Nurse Practitioner

## 2021-10-09 VITALS — BP 120/72 | HR 65 | Ht 69.0 in | Wt 156.4 lb

## 2021-10-09 DIAGNOSIS — I2510 Atherosclerotic heart disease of native coronary artery without angina pectoris: Secondary | ICD-10-CM

## 2021-10-09 DIAGNOSIS — J9611 Chronic respiratory failure with hypoxia: Secondary | ICD-10-CM | POA: Diagnosis not present

## 2021-10-09 DIAGNOSIS — K219 Gastro-esophageal reflux disease without esophagitis: Secondary | ICD-10-CM | POA: Diagnosis not present

## 2021-10-09 DIAGNOSIS — E785 Hyperlipidemia, unspecified: Secondary | ICD-10-CM

## 2021-10-09 DIAGNOSIS — C3490 Malignant neoplasm of unspecified part of unspecified bronchus or lung: Secondary | ICD-10-CM

## 2021-10-09 DIAGNOSIS — M79604 Pain in right leg: Secondary | ICD-10-CM

## 2021-10-09 DIAGNOSIS — Z87891 Personal history of nicotine dependence: Secondary | ICD-10-CM

## 2021-10-09 DIAGNOSIS — R030 Elevated blood-pressure reading, without diagnosis of hypertension: Secondary | ICD-10-CM

## 2021-10-09 DIAGNOSIS — M79605 Pain in left leg: Secondary | ICD-10-CM

## 2021-10-09 DIAGNOSIS — J449 Chronic obstructive pulmonary disease, unspecified: Secondary | ICD-10-CM | POA: Diagnosis not present

## 2021-10-09 MED ORDER — ISOSORBIDE MONONITRATE ER 30 MG PO TB24: 15.0000 mg | ORAL_TABLET | Freq: Every day | ORAL | 3 refills | Status: DC

## 2021-10-09 NOTE — Patient Instructions (Signed)
Medication Instructions:  Your physician has recommended you make the following change in your medication: START: IMDUR 15MG  DAILY *If you need a refill on your cardiac medications before your next appointment, please call your pharmacy*   Lab Work: Your physician recommends that you return for lab work at your earliest convenience : LIPIDS and LFT's   If you have labs (blood work) drawn today and your tests are completely normal, you will receive your results only by: MyChart Message (if you have MyChart) OR A paper copy in the mail If you have any lab test that is abnormal or we need to change your treatment, we will call you to review the results.   Testing/Procedures: Your physician has requested that you have an ankle brachial index (ABI). During this test an ultrasound and blood pressure cuff are used to evaluate the arteries that supply the arms and legs with blood. Allow thirty minutes for this exam. There are no restrictions or special instructions.    Follow-Up: At [facility], you and your health needs are our priority.  As part of our continuing mission to provide you with exceptional heart care, we have created designated Provider Care Teams.  These Care Teams include your primary Cardiologist (physician) and Advanced Practice Providers (APPs -  Physician Assistants and Nurse Practitioners) who all work together to provide you with the care you need, when you need it.  We recommend signing up for the patient portal called "MyChart".  Sign up information is provided on this After Visit Summary.  MyChart is used to connect with patients for Virtual Visits (Telemedicine).  Patients are able to view lab/test results, encounter notes, upcoming appointments, etc.  Non-urgent messages can be sent to your provider as well.   To learn more about what you can do with MyChart, go to [email or web address].    Your next appointment:   3 month(s)  The format for your next  appointment:   In Person  Provider:   Diona Browner, NP
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
## 2021-10-10 ENCOUNTER — Other Ambulatory Visit (HOSPITAL_COMMUNITY): Payer: Self-pay | Admitting: Nurse Practitioner

## 2021-10-10 DIAGNOSIS — I739 Peripheral vascular disease, unspecified: Secondary | ICD-10-CM

## 2021-10-14 ENCOUNTER — Ambulatory Visit (INDEPENDENT_AMBULATORY_CARE_PROVIDER_SITE_OTHER): Payer: Medicare Other

## 2021-10-14 ENCOUNTER — Ambulatory Visit (HOSPITAL_COMMUNITY)
Admission: RE | Admit: 2021-10-14 | Discharge: 2021-10-14 | Disposition: A | Discharge status: Home / Self Care | Payer: Medicare Other | Source: Ambulatory Visit | Attending: Internal Medicine | Admitting: Internal Medicine

## 2021-10-14 ENCOUNTER — Other Ambulatory Visit: Payer: Self-pay

## 2021-10-14 VITALS — BP 124/87 | HR 69 | Temp 97.7°F | Ht 69.0 in | Wt 155.8 lb

## 2021-10-14 DIAGNOSIS — E785 Hyperlipidemia, unspecified: Secondary | ICD-10-CM | POA: Insufficient documentation

## 2021-10-14 DIAGNOSIS — G8929 Other chronic pain: Secondary | ICD-10-CM

## 2021-10-14 DIAGNOSIS — J439 Emphysema, unspecified: Secondary | ICD-10-CM

## 2021-10-14 DIAGNOSIS — Z87891 Personal history of nicotine dependence: Secondary | ICD-10-CM

## 2021-10-14 DIAGNOSIS — Z5181 Encounter for therapeutic drug level monitoring: Secondary | ICD-10-CM | POA: Diagnosis not present

## 2021-10-14 DIAGNOSIS — R519 Headache, unspecified: Secondary | ICD-10-CM

## 2021-10-14 DIAGNOSIS — R079 Chest pain, unspecified: Secondary | ICD-10-CM

## 2021-10-14 DIAGNOSIS — Z13220 Encounter for screening for lipoid disorders: Secondary | ICD-10-CM | POA: Diagnosis not present

## 2021-10-14 DIAGNOSIS — G894 Chronic pain syndrome: Secondary | ICD-10-CM | POA: Diagnosis not present

## 2021-10-14 DIAGNOSIS — I10 Essential (primary) hypertension: Secondary | ICD-10-CM

## 2021-10-14 DIAGNOSIS — Z791 Long term (current) use of non-steroidal anti-inflammatories (NSAID): Secondary | ICD-10-CM | POA: Diagnosis not present
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
## 2021-10-14 NOTE — Assessment & Plan Note (Addendum)
Patient endorses history of headaches going on 6 months that are off and on.  He attributes this to Tylenol but states they continue despite having stopped taking Tylenol.  These happen 2-3 times a week and are described as dull and located at his temples.  He takes Aleve which helps with his symptoms.  Likely tension headaches. Patient is already on many medications for pain.   -Recommended he stop taking Aleve given history of worsened renal function and concurrent use of meloxicam

## 2021-10-14 NOTE — Assessment & Plan Note (Signed)
Patient reports episode of chest pain this morning.  Pain located in the left lateral chest described as squeezing.  He has had this on and off for many years with previous EKGs unremarkable.  He is followed by cardiology.  EKG today without any signs of new ischemia.  -Continue follow-up with cardiology

## 2021-10-14 NOTE — Assessment & Plan Note (Addendum)
Followed by Dr. Lamonte Sakai in pulmonology. Vital signs stable today. No wheezing on exam. Stable on 2L O2.   -Continue f/u with pulmonology -Continue with inhalers, prednisone, and abx -O2 supplementation

## 2021-10-14 NOTE — Assessment & Plan Note (Addendum)
Patient is followed by [facility] pain clinic.  He has had steroid injections in the past and more recently a nerve block.  He is scheduled to return on September 14.  He states his back pain is a 7 out of 10 at rest and a 9 out of 10 when moving.  He requests additional pain medication today.  He is currently prescribed Cymbalta Robaxin Lyrica and also takes Tylenol.  States he was recently prescribed 5 days of tramadol and has about 2 days worth of pills left.  -We will defer to pain management for possible need for opioid therapy -f/u with pain management on 9/14

## 2021-10-14 NOTE — Assessment & Plan Note (Signed)
BP 124/87 today. Has had some systolics over 888 in the past but not hypertensive today. Will hold off on medications for now. -Continue to monitor

## 2021-10-14 NOTE — Assessment & Plan Note (Signed)
Last lipid panel in 2017. -lipid panel  Addendum: LDL 136.  -start Crestor 20 mg daily

## 2021-10-14 NOTE — Patient Instructions (Signed)
[name], it was a pleasure seeing you today!  Today we discussed: Back Pain: You are scheduled to see pain management on 9/14. In the meantime, please do not take Aleve in addition to your meloxicam given your history of worsened kidney function. We will get lab work today to ensure your kidneys are healthy. COPD: Continue with your inhaled medications, prednisone, and antibiotic rotation as previously. [place]gm: Please be sure to take your antihistamine (allegra) as your symptoms may be a result of post-nasal drip. Otherwise, please be sure to continue seeing your pulmonologist.  [place]: We will place a referral to Ulyess Blossom, our social worker, to look into any possible services to help with your needs at home.  I have ordered the following labs today:  Lab Orders         BMP8+Anion Gap         Lipid Profile       Tests ordered today:  EKG  Referrals ordered today:   Referral Orders  No referral(s) requested today     I have ordered the following medication/changed the following medications:   Stop the following medications: Medications Discontinued During This Encounter  Medication Reason   oxyCODONE (ROXICODONE) 5 MG immediate release tablet Patient has not taken in last 30 days     Start the following medications: No orders of the defined types were placed in this encounter.    Follow-up: 6 months   Please make sure to arrive 15 minutes prior to your next appointment. If you arrive late, you may be asked to reschedule.   We look forward to seeing you next time. Please call our clinic at [phone] if you have any questions or concerns. The best time to call is Monday-Friday from 9am-4pm, but there is someone available 24/7. If after hours or the weekend, call the main hospital number and ask for the Internal Medicine Resident On-Call. If you need medication refills, please notify your pharmacy one week in advance and they will send [place] a  request.  Thank you for letting us take part in your care. Wishing you the best!  Thank you, Linward Natal, MD

## 2021-10-15 ENCOUNTER — Telehealth: Payer: Self-pay

## 2021-10-15 ENCOUNTER — Other Ambulatory Visit (HOSPITAL_BASED_OUTPATIENT_CLINIC_OR_DEPARTMENT_OTHER): Payer: Medicare Other

## 2021-10-15 LAB — BMP8+ANION GAP
Anion Gap: 19 mmol/L — ABNORMAL HIGH (ref 10.0–18.0)
BUN/Creatinine Ratio: 7 — ABNORMAL LOW (ref 10–24)
BUN: 7 mg/dL — ABNORMAL LOW (ref 8–27)
CO2: 21 mmol/L (ref 20–29)
Calcium: 9.8 mg/dL (ref 8.6–10.2)
Chloride: 100 mmol/L (ref 96–106)
Creatinine, Ser: 1.05 mg/dL (ref 0.76–1.27)
Glucose: 76 mg/dL (ref 70–99)
Potassium: 3.4 mmol/L — ABNORMAL LOW (ref 3.5–5.2)
Sodium: 140 mmol/L (ref 134–144)
eGFR: 78 mL/min/{1.73_m2} (ref >59)

## 2021-10-15 LAB — LIPID PANEL
Chol/HDL Ratio: 3.4 ratio (ref 0.0–5.0)
Cholesterol, Total: 229 mg/dL — ABNORMAL HIGH (ref 100–199)
HDL: 67 mg/dL (ref >39)
LDL Chol Calc (NIH): 136 mg/dL — ABNORMAL HIGH (ref 0–99)
Triglycerides: 149 mg/dL (ref 0–149)
VLDL Cholesterol Cal: 26 mg/dL (ref 5–40)

## 2021-10-15 MED ORDER — ONDANSETRON 4 MG PO TBDP
ORAL_TABLET | ORAL | 0 refills | Status: DC
Start: 2021-10-15 — End: 2021-10-25

## 2021-10-15 MED ORDER — ROSUVASTATIN CALCIUM 20 MG PO TABS: 20.0000 mg | ORAL_TABLET | Freq: Every day | ORAL | 11 refills | Status: DC

## 2021-10-15 NOTE — Addendum Note (Signed)
Addended by: Linward Natal on: 10/15/2021 08:45 AM   Modules accepted: Orders

## 2021-10-15 NOTE — Telephone Encounter (Signed)
Pt  called this morning requesting to have his tramdol refilled by Dr white ... Stating he had spoke to the Dr  and forgot to ask for that to be refilled .[name] I reached out to Dr Dema Severin Via Secure chat  .[name] Message as follows  and relies .     ME :good morning .[name] pt is return a call from the call you had with his this morning ... he stated that he forgot to tell you he needed his tramdol filled also at the [facility]  also ... thanks in advance       DR WHITE :was not planning on refilling his tramadol. He said he had a couple days worth to get him to pain clinic follow up in 2 days. I'll have to talk with attending. Thanks for letting me know.  ME : And I can call him back let him know when ever you find out the answer     DR WHITE :please call him back and let him know he can take Aleve until he follows up with pain clinic on Thursday. Thank you!    ME : pt notified

## 2021-10-16 ENCOUNTER — Other Ambulatory Visit: Payer: Self-pay

## 2021-10-16 ENCOUNTER — Ambulatory Visit (HOSPITAL_COMMUNITY)
Admission: RE | Admit: 2021-10-16 | Discharge: 2021-10-16 | Disposition: A | Discharge status: Home / Self Care | Payer: Medicare Other | Source: Ambulatory Visit | Attending: Cardiovascular Disease | Admitting: Cardiovascular Disease

## 2021-10-16 ENCOUNTER — Telehealth: Payer: Self-pay

## 2021-10-16 DIAGNOSIS — E785 Hyperlipidemia, unspecified: Secondary | ICD-10-CM

## 2021-10-16 DIAGNOSIS — I739 Peripheral vascular disease, unspecified: Secondary | ICD-10-CM | POA: Diagnosis not present

## 2021-10-16 DIAGNOSIS — I2510 Atherosclerotic heart disease of native coronary artery without angina pectoris: Secondary | ICD-10-CM

## 2021-10-16 DIAGNOSIS — M79604 Pain in right leg: Secondary | ICD-10-CM

## 2021-10-16 DIAGNOSIS — M79605 Pain in left leg: Secondary | ICD-10-CM | POA: Insufficient documentation

## 2021-10-16 DIAGNOSIS — Z87891 Personal history of nicotine dependence: Secondary | ICD-10-CM | POA: Diagnosis not present

## 2021-10-16 NOTE — Addendum Note (Signed)
Addended by: Derrick Ravel on: 2/83/6629 47:65 PM   Modules accepted: Orders

## 2021-10-16 NOTE — Telephone Encounter (Signed)
Lmom, to discuss lab results. Waiting on a return call.

## 2021-10-16 NOTE — Telephone Encounter (Signed)
Pt returned call. Pt was notified of lab results and recommedations. Lab orders placed and medication sent to pts pharmacy.

## 2021-10-17 ENCOUNTER — Telehealth: Payer: Self-pay | Admitting: Emergency Medicine

## 2021-10-17 DIAGNOSIS — M5416 Radiculopathy, lumbar region: Secondary | ICD-10-CM | POA: Diagnosis not present

## 2021-10-17 MED ORDER — FLUTICASONE-UMECLIDIN-VILANT 200-62.5-25 MCG/ACT IN AEPB: 1.0000 | INHALATION_SPRAY | Freq: Every day | RESPIRATORY_TRACT | 0 refills | Status: DC

## 2021-10-17 NOTE — Progress Notes (Addendum)
[facility] Clinic Attending  Case discussed with Dr. Dema Severin  at the time of the visit.  We reviewed the resident's history and exam and pertinent patient test results.  I agree with the assessment, diagnosis, and plan of care documented in the resident's note.

## 2021-10-17 NOTE — Telephone Encounter (Signed)
Trelegy 200 was given to patient in lobby  Nothing further

## 2021-10-18 ENCOUNTER — Telehealth: Payer: Self-pay

## 2021-10-18 NOTE — Telephone Encounter (Signed)
Spoke with pt. Pt was notified of vas u/s results. Pt will continue current medications.

## 2021-10-24 ENCOUNTER — Other Ambulatory Visit: Payer: Self-pay | Admitting: Gastroenterology

## 2021-10-24 ENCOUNTER — Other Ambulatory Visit: Payer: Self-pay

## 2021-10-25 DIAGNOSIS — J441 Chronic obstructive pulmonary disease with (acute) exacerbation: Secondary | ICD-10-CM | POA: Diagnosis not present

## 2021-10-26 DIAGNOSIS — J432 Centrilobular emphysema: Secondary | ICD-10-CM | POA: Diagnosis not present

## 2021-11-01 ENCOUNTER — Telehealth: Payer: Self-pay | Admitting: Emergency Medicine

## 2021-11-01 MED ORDER — PREDNISONE 10 MG PO TABS: 10.0000 mg | ORAL_TABLET | Freq: Every day | ORAL | 3 refills | Status: DC

## 2021-11-01 NOTE — Telephone Encounter (Signed)
Pt called office again about needing prednisone refilled. Rx for prednisone sent to preferred pharmacy. Nothing further needed.

## 2021-11-01 NOTE — Telephone Encounter (Signed)
Patient requesting refill on prednisone sent to [facility] on [address].  Please advise and call patient back with update.

## 2021-11-11 ENCOUNTER — Ambulatory Visit (HOSPITAL_BASED_OUTPATIENT_CLINIC_OR_DEPARTMENT_OTHER): Payer: Medicare Other

## 2021-11-11 DIAGNOSIS — M5416 Radiculopathy, lumbar region: Secondary | ICD-10-CM | POA: Diagnosis not present

## 2021-11-13 ENCOUNTER — Ambulatory Visit (HOSPITAL_BASED_OUTPATIENT_CLINIC_OR_DEPARTMENT_OTHER)
Admission: RE | Admit: 2021-11-13 | Discharge: 2021-11-13 | Disposition: A | Discharge status: Home / Self Care | Payer: Medicare Other | Source: Ambulatory Visit | Attending: Emergency Medicine | Admitting: Emergency Medicine

## 2021-11-13 DIAGNOSIS — R918 Other nonspecific abnormal finding of lung field: Secondary | ICD-10-CM | POA: Diagnosis not present

## 2021-11-13 DIAGNOSIS — R9389 Abnormal findings on diagnostic imaging of other specified body structures: Secondary | ICD-10-CM | POA: Insufficient documentation

## 2021-11-13 DIAGNOSIS — J432 Centrilobular emphysema: Secondary | ICD-10-CM | POA: Diagnosis not present

## 2021-11-14 NOTE — Addendum Note (Signed)
Addended by: Linward Natal on: 11/14/2021 05:39 PM   Modules accepted: Orders

## 2021-11-15 ENCOUNTER — Telehealth: Payer: Self-pay | Admitting: *Deleted

## 2021-11-15 NOTE — Chronic Care Management (AMB) (Signed)
  Care Coordination   Note   11/15/2021 Name: [name] MRN: [id number] DOB: 10/18/1955  [name] is a 66 y.o. year old male who sees Amponsah, Charisse March, MD for primary care. I reached out to [name] by phone today to offer care coordination services.  [name] was given information about Care Coordination services today including:   The Care Coordination services include support from the care team which includes your Nurse Coordinator, Clinical Social Worker, or Pharmacist.  The Care Coordination team is here to help remove barriers to the health concerns and goals most important to you. Care Coordination services are voluntary, and the patient may decline or stop services at any time by request to their care team member.   Care Coordination Consent Status: Patient agreed to services and verbal consent obtained.   Follow up plan:  Telephone appointment with care coordination team member scheduled for:  11/18/21  Encounter Outcome:  Pt. Scheduled  [place]  Direct Dial: [phone]

## 2021-11-18 ENCOUNTER — Ambulatory Visit: Payer: Self-pay | Admitting: Licensed Clinical Social Worker

## 2021-11-18 NOTE — Patient Outreach (Signed)
  Care Coordination   Initial Visit Note   11/18/2021 Name: [name] MRN: [id number] DOB: March 10, 1955  [name] is a 66 y.o. year old male who sees Amponsah, Charisse March, MD for primary care. I spoke with  [name] by phone today.  What matters to the patients health and wellness today?  ADL"S    Goals Addressed               This Visit's Progress     Care Coordination Activities- Home Health (pt-stated)        Patient requesting assistance with Cooking, Cleaning and additional ADL's. Patient experiencing pain on a scale of 10 in back. Patient expressed constantly being out of breath with minimum movement. Patient expressed inability to clean and cook for self.   SW sent message for [facility] to outreach patient.         SDOH assessments and interventions completed:  Yes     Care Coordination Interventions Activated:  Yes  Care Coordination Interventions:  Yes, provided   Follow up plan: Follow up call scheduled for within the next 30 days.    Encounter Outcome:  Pt. Visit Completed

## 2021-11-18 NOTE — Patient Instructions (Signed)
Visit Information  Thank you for taking time to visit with me today. Please don't hesitate to contact me if I can be of assistance to you.   Following are the goals we discussed today:   Goals Addressed               This [place] (pt-stated)        Patient requesting assistance with Cooking, Cleaning and additional ADL's. Patient experiencing pain on a scale of 10 in back. Patient expressed constantly being out of breath with minimum movement. Patient expressed inability to clean and cook for self.   SW sent message for [facility] to outreach patient.           Patient verbalizes understanding of instructions and care plan provided today and agrees to view in [place]. Active MyChart status and patient understanding of how to access instructions and care plan via MyChart confirmed with patient.     The care management team will reach out to the patient again over the next 30 days.   Milus Height, MSW, [place] Social Worker [facility] Care Management  [phone]

## 2021-11-19 ENCOUNTER — Other Ambulatory Visit: Payer: Self-pay | Admitting: Urology

## 2021-11-19 ENCOUNTER — Encounter: Payer: Self-pay | Admitting: Gastroenterology

## 2021-11-19 ENCOUNTER — Ambulatory Visit (INDEPENDENT_AMBULATORY_CARE_PROVIDER_SITE_OTHER): Payer: Medicare Other | Admitting: Gastroenterology

## 2021-11-19 VITALS — BP 138/72 | HR 84 | Ht 69.0 in | Wt 164.0 lb

## 2021-11-19 DIAGNOSIS — R194 Change in bowel habit: Secondary | ICD-10-CM

## 2021-11-19 DIAGNOSIS — K580 Irritable bowel syndrome with diarrhea: Secondary | ICD-10-CM | POA: Diagnosis not present

## 2021-11-19 DIAGNOSIS — Z792 Long term (current) use of antibiotics: Secondary | ICD-10-CM

## 2021-11-19 DIAGNOSIS — K638219 Small intestinal bacterial overgrowth, unspecified: Secondary | ICD-10-CM

## 2021-11-19 DIAGNOSIS — R1084 Generalized abdominal pain: Secondary | ICD-10-CM

## 2021-11-19 DIAGNOSIS — D49511 Neoplasm of unspecified behavior of right kidney: Secondary | ICD-10-CM

## 2021-11-19 DIAGNOSIS — R109 Unspecified abdominal pain: Secondary | ICD-10-CM

## 2021-11-19 DIAGNOSIS — C642 Malignant neoplasm of left kidney, except renal pelvis: Secondary | ICD-10-CM

## 2021-11-19 MED ORDER — MESALAMINE 1.2 G PO TBEC: DELAYED_RELEASE_TABLET | ORAL | 4 refills | Status: DC

## 2021-11-19 MED ORDER — RIFAXIMIN 550 MG PO TABS: 550.0000 mg | ORAL_TABLET | Freq: Three times a day (TID) | ORAL | 0 refills | Status: DC

## 2021-11-19 MED ORDER — SUCRALFATE 1 G PO TABS: 1.0000 g | ORAL_TABLET | Freq: Three times a day (TID) | ORAL | 0 refills | Status: DC

## 2021-11-19 MED ORDER — DICYCLOMINE HCL 10 MG PO CAPS: 10.0000 mg | ORAL_CAPSULE | Freq: Two times a day (BID) | ORAL | 2 refills | Status: DC

## 2021-11-19 NOTE — Patient Instructions (Signed)
You have been scheduled for a CT scan of the abdomen and pelvis at [facility], 1st floor Radiology. You are scheduled on 11/22/21 at 8:00 am. You should arrive 15 minutes prior to your appointment time for registration.  We are giving you 2 bottles of contrast today that you will need to drink before arriving for the exam. The solution may taste better if refrigerated so put them in the refrigerator when you get home, but do NOT add ice or any other liquid to this solution as that would dilute it. Shake well before drinking.   Please follow the written instructions below on the day of your exam:   1) Do not eat anything after 4:00am  (4 hours prior to your test)   2) Drink 1 bottle of contrast @ 6:00 am  (2 hours prior to your exam)  Remember to shake well before drinking and do NOT pour over ice.     Drink 1 bottle of contrast @ 7:00 am  (1 hour prior to your exam)   You may take any medications as prescribed with a small amount of water, if necessary. If you take any of the following medications: METFORMIN, GLUCOPHAGE, GLUCOVANCE, AVANDAMET, RIOMET, FORTAMET, [place] MET, JANUMET, GLUMETZA or METAGLIP, you MAY be asked to HOLD this medication 48 hours AFTER the exam.   The purpose of you drinking the oral contrast is to aid in the visualization of your intestinal tract. The contrast solution may cause some diarrhea. Depending on your individual set of symptoms, you may also receive an intravenous injection of x-ray contrast/dye. Plan on being at [facility] for 45 minutes or longer, depending on the type of exam you are having performed.   If you have any questions regarding your exam or if you need to reschedule, you may call [facility] at [phone] between the hours of 8:00 am and 5:00 pm, Monday-Friday.    Decrease your Mesalamine to 2 pills at bedtime.   We have sent the following medications to your pharmacy for you to pick up at your convenience: Carafate,  Dicyclomine   Thank you for choosing me and [facility].  Dr. Rush Landmark
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
## 2021-11-20 ENCOUNTER — Encounter: Payer: Self-pay | Admitting: Gastroenterology

## 2021-11-21 ENCOUNTER — Telehealth: Payer: Self-pay | Admitting: Gastroenterology

## 2021-11-21 DIAGNOSIS — K638219 Small intestinal bacterial overgrowth, unspecified: Secondary | ICD-10-CM | POA: Insufficient documentation

## 2021-11-21 DIAGNOSIS — K580 Irritable bowel syndrome with diarrhea: Secondary | ICD-10-CM | POA: Insufficient documentation

## 2021-11-21 NOTE — Telephone Encounter (Signed)
Inbound call from patient states one of his rx cost over $2,000 and needed a prior auth. He does not recall which medication it is. Could be Carafate or dicyclomine. Please advise

## 2021-11-22 ENCOUNTER — Ambulatory Visit (HOSPITAL_COMMUNITY)
Admission: RE | Admit: 2021-11-22 | Discharge: 2021-11-22 | Disposition: A | Discharge status: Home / Self Care | Payer: Medicare Other | Source: Ambulatory Visit | Attending: Gastroenterology | Admitting: Gastroenterology

## 2021-11-22 ENCOUNTER — Ambulatory Visit (HOSPITAL_COMMUNITY): Payer: Medicare Other

## 2021-11-22 DIAGNOSIS — R1084 Generalized abdominal pain: Secondary | ICD-10-CM | POA: Diagnosis not present

## 2021-11-22 DIAGNOSIS — K580 Irritable bowel syndrome with diarrhea: Secondary | ICD-10-CM | POA: Diagnosis not present

## 2021-11-22 DIAGNOSIS — R109 Unspecified abdominal pain: Secondary | ICD-10-CM

## 2021-11-22 DIAGNOSIS — K638219 Small intestinal bacterial overgrowth, unspecified: Secondary | ICD-10-CM

## 2021-11-22 DIAGNOSIS — N281 Cyst of kidney, acquired: Secondary | ICD-10-CM | POA: Diagnosis not present

## 2021-11-22 MED ORDER — IOHEXOL 300 MG/ML  SOLN
100.0000 mL | Freq: Once | INTRAMUSCULAR | Status: AC | PRN
  Administered 2021-11-22: 100 mL via INTRAVENOUS

## 2021-11-24 DIAGNOSIS — J441 Chronic obstructive pulmonary disease with (acute) exacerbation: Secondary | ICD-10-CM | POA: Diagnosis not present

## 2021-11-25 DIAGNOSIS — J432 Centrilobular emphysema: Secondary | ICD-10-CM | POA: Diagnosis not present

## 2021-11-26 ENCOUNTER — Telehealth: Payer: Self-pay

## 2021-11-26 ENCOUNTER — Other Ambulatory Visit (HOSPITAL_COMMUNITY): Payer: Self-pay

## 2021-11-26 DIAGNOSIS — M810 Age-related osteoporosis without current pathological fracture: Secondary | ICD-10-CM | POA: Diagnosis not present

## 2021-11-26 DIAGNOSIS — M5416 Radiculopathy, lumbar region: Secondary | ICD-10-CM | POA: Diagnosis not present

## 2021-11-26 NOTE — Telephone Encounter (Signed)
Patient Advocate Encounter   Received notification from [employer] that prior authorization is required for Xifaxan 550MG  tablet  Submitted: 11-26-2021 Key [id number]  Status is pending

## 2021-11-27 ENCOUNTER — Other Ambulatory Visit (HOSPITAL_COMMUNITY): Payer: Self-pay

## 2021-11-27 ENCOUNTER — Encounter: Payer: Self-pay | Admitting: Emergency Medicine

## 2021-11-27 ENCOUNTER — Ambulatory Visit (INDEPENDENT_AMBULATORY_CARE_PROVIDER_SITE_OTHER): Payer: Medicare Other | Admitting: Emergency Medicine

## 2021-11-27 DIAGNOSIS — J439 Emphysema, unspecified: Secondary | ICD-10-CM

## 2021-11-27 DIAGNOSIS — R9389 Abnormal findings on diagnostic imaging of other specified body structures: Secondary | ICD-10-CM

## 2021-11-27 DIAGNOSIS — J449 Chronic obstructive pulmonary disease, unspecified: Secondary | ICD-10-CM | POA: Diagnosis not present

## 2021-11-27 DIAGNOSIS — J9611 Chronic respiratory failure with hypoxia: Secondary | ICD-10-CM | POA: Diagnosis not present

## 2021-11-27 DIAGNOSIS — K219 Gastro-esophageal reflux disease without esophagitis: Secondary | ICD-10-CM

## 2021-11-27 MED ORDER — TRELEGY ELLIPTA 100-62.5-25 MCG/ACT IN AEPB: 1.0000 | INHALATION_SPRAY | Freq: Every day | RESPIRATORY_TRACT | 5 refills | Status: DC

## 2021-11-27 MED ORDER — PREDNISONE 10 MG PO TABS: 10.0000 mg | ORAL_TABLET | Freq: Every day | ORAL | 3 refills | Status: DC

## 2021-11-27 MED ORDER — AZITHROMYCIN 250 MG PO TABS: ORAL_TABLET | ORAL | 3 refills | Status: DC

## 2021-11-27 MED ORDER — DOXYCYCLINE HYCLATE 100 MG PO TABS: ORAL_TABLET | ORAL | 3 refills | Status: DC

## 2021-11-27 MED ORDER — ALBUTEROL SULFATE (2.5 MG/3ML) 0.083% IN NEBU: 2.5000 mg | INHALATION_SOLUTION | Freq: Four times a day (QID) | RESPIRATORY_TRACT | 12 refills | Status: DC | PRN

## 2021-11-27 MED ORDER — CEFUROXIME AXETIL 250 MG PO TABS: 250.0000 mg | ORAL_TABLET | Freq: Two times a day (BID) | ORAL | 0 refills | Status: DC

## 2021-11-27 MED ORDER — ALBUTEROL SULFATE HFA 108 (90 BASE) MCG/ACT IN AERS: INHALATION_SPRAY | RESPIRATORY_TRACT | 5 refills | Status: DC

## 2021-11-27 MED ORDER — PANTOPRAZOLE SODIUM 40 MG PO TBEC: 40.0000 mg | DELAYED_RELEASE_TABLET | Freq: Every day | ORAL | 6 refills | Status: DC

## 2021-11-27 NOTE — Assessment & Plan Note (Signed)
Continue current PPI.  He is benefiting although he does have breakthrough GERD causing some upper airway irritation.

## 2021-11-27 NOTE — Assessment & Plan Note (Addendum)
>>  ASSESSMENT AND PLAN FOR COPD WITH EMPHYSEMA (HCC) WRITTEN ON 11/27/2021  2:53 PM BY [name], Les Pou, MD  Off of his rotating antibiotics.  He did not get these refilled last month.  Unclear whether he does not have any refills or whether he just failed to do so.  We will confirm today.  Plan to restart.  Continue his other regimen as ordered  Please continue Trelegy once daily.  Rinse and gargle after using. Keep your albuterol available to use 2 puffs when needed for shortness of breath, chest tightness, wheezing. Continue your prednisone 10 mg daily We will restart your rotating antibiotics for the first week of alternating months (azithromycin, cefuroxime, doxycycline).  We will confirm that you have refills.  >>ASSESSMENT AND PLAN FOR CHRONIC RESPIRATORY FAILURE WITH HYPOXIA (HCC) WRITTEN ON 11/27/2021  2:53 PM BY [name], Les Pou, MD  He still tries to ambulate without his oxygen.  Did talk to him today about better compliance.  We will check SPO2 on room air at rest to see if he needs to wear it at rest as well as with exertion.

## 2021-11-27 NOTE — Progress Notes (Signed)
Subjective:    Patient ID: [name], male    DOB: 1955/12/01, 66 y.o.   MRN: [id number]  COPD He complains of cough and shortness of breath. There is no wheezing. Pertinent negatives include no ear pain, fever, headaches, postnasal drip, rhinorrhea, sneezing, sore throat or trouble swallowing. His past medical history is significant for COPD.   HPI     ROV 11/27/21 --66 year old man with severe COPD, former tobacco use.  He has a history of bilateral surgical resections for adenocarcinoma.  He is managed on Trelegy, 10 mg of prednisone, rotating antibiotics (azithromycin, cefuroxime, doxycycline).  He has multiple pulmonary nodules that were noted on CT chest 08/2021 and then showed hypermetabolism on PET scan.  Question whether these were inflammatory.  We repeated his CT as below. He feels that his breathing may be a bit worse - he notes that he is requiring his O2 more (he was walking distances without it). He is using albuterol a few times a week. Minimal cough right now. He is having a lot of GERD, some throat irritation. He is off of his rotating abx, ran out 1 month ago.   CT scan of the chest 11/13/2021 reviewed by me, shows that his multiple bilateral pulmonary nodules are stable to decreased in size in the interval.  Some of the smaller nodules have resolved altogether.  Most consistent with an inflammatory process.                                                                                                                                          Review of Systems  Constitutional:  Negative for fever and unexpected weight change.  HENT:  Positive for congestion. Negative for dental problem, ear pain, nosebleeds, postnasal drip, rhinorrhea, sinus pressure, sneezing, sore throat and trouble swallowing.   Eyes:  Negative for redness and itching.  Respiratory:  Positive for cough and shortness of breath. Negative for chest tightness and wheezing.   Cardiovascular:  Positive for  palpitations. Negative for leg swelling.  Gastrointestinal:  Negative for nausea and vomiting.  Genitourinary:  Negative for dysuria.  Musculoskeletal:  Negative for joint swelling.  Skin:  Negative for rash.  Neurological:  Negative for headaches.  Hematological:  Does not bruise/bleed easily.  Psychiatric/Behavioral:  Positive for dysphoric mood. The patient is nervous/anxious.        Objective:   Physical Exam  Vitals:   11/27/21 1430  BP: 120/78  Pulse: 63  Temp: 97.7 F (36.5 C)  SpO2: 92%  Weight: 166 lb 12.8 oz (75.7 kg)  Height: 5\' 7"  (1.702 m)   Gen: Pleasant, well-nourished, in no distress, somewhat depressed affect  ENT: No lesions,  mouth clear,  oropharynx clear, no postnasal drip  Neck: No JVD, no stridor  Lungs: No use of accessory muscles, clear without rales or rhonchi, no wheeze  Cardiovascular: RRR, heart sounds normal,  no murmur or gallops, no peripheral edema  Musculoskeletal: no deformities   Neuro: alert, non focal  Skin: Warm, no lesions or rashes     Assessment & Plan:  COPD with emphysema ([place]) Off of his rotating antibiotics.  He did not get these refilled last month.  Unclear whether he does not have any refills or whether he just failed to do so.  We will confirm today.  Plan to restart.  Continue his other regimen as ordered  Please continue Trelegy once daily.  Rinse and gargle after using. Keep your albuterol available to use 2 puffs when needed for shortness of breath, chest tightness, wheezing. Continue your prednisone 10 mg daily We will restart your rotating antibiotics for the first week of alternating months (azithromycin, cefuroxime, doxycycline).  We will confirm that you have refills.  Laryngopharyngeal reflux (LPR) Continue current PPI.  He is benefiting although he does have breakthrough GERD causing some upper airway irritation.  Chronic respiratory failure with hypoxia (HCC) He still tries to ambulate without his  oxygen.  Did talk to him today about better compliance.  We will check SPO2 on room air at rest to see if he needs to wear it at rest as well as with exertion.  Abnormal CT of the chest Pulmonary nodules are all grossly stable to smaller.  Good news.  We will plan to repeat his CT in 6 months    Baltazar Apo, MD, PhD 11/27/2021, 2:54 PM [facility] [phone] or if no answer [phone]

## 2021-11-27 NOTE — Assessment & Plan Note (Signed)
He still tries to ambulate without his oxygen.  Did talk to him today about better compliance.  We will check SPO2 on room air at rest to see if he needs to wear it at rest as well as with exertion.

## 2021-11-27 NOTE — Assessment & Plan Note (Signed)
Pulmonary nodules are all grossly stable to smaller.  Good news.  We will plan to repeat his CT in 6 months

## 2021-11-27 NOTE — Addendum Note (Signed)
Addended by: Gavin Potters R on: 11/27/2021 03:11 PM   Modules accepted: Orders

## 2021-11-27 NOTE — Assessment & Plan Note (Signed)
>>  ASSESSMENT AND PLAN FOR LARYNGOPHARYNGEAL REFLUX (LPR) WRITTEN ON 11/27/2021  2:53 PM BY BYRUM, LAMAR RAMAN, MD  Continue current PPI.  He is benefiting although he does have breakthrough GERD causing some upper airway irritation.

## 2021-11-27 NOTE — Telephone Encounter (Signed)
Patient Advocate Encounter  Prior Authorization for Xifaxan 550mg  has been approved.    PA# [id number] Effective dates: 11/27/2021 through 02/03/2023  Joneen Boers, [place] Patient Advocate Specialist [facility] Patient Advocate Team Direct Number: [phone] Fax: [phone]

## 2021-11-27 NOTE — Patient Instructions (Signed)
Please continue Trelegy once daily.  Rinse and gargle after using. Keep your albuterol available to use 2 puffs when needed for shortness of breath, chest tightness, wheezing. Continue your prednisone 10 mg daily We will restart your rotating antibiotics for the first week of alternating months (azithromycin, cefuroxime, doxycycline).  We will confirm that you have refills. Wear your oxygen reliably when you are exerting yourself.  You need to avoid walking or exercising without it. We will plan to repeat your CT scan of the chest without contrast in 6 months, April 2024. Follow with Dr. Lamonte Sakai in April after your CT scan so we can review the results together.  Call sooner if you have any problems.

## 2021-11-27 NOTE — Addendum Note (Signed)
Addended by: Gavin Potters R on: 11/27/2021 03:16 PM   Modules accepted: Orders

## 2021-11-29 NOTE — Telephone Encounter (Signed)
Left message

## 2021-12-04 ENCOUNTER — Ambulatory Visit: Payer: Self-pay

## 2021-12-04 NOTE — Telephone Encounter (Signed)
Spoke with patient. The medication was Xifaxan. PA was obtained and patient did recd Xifaxan.

## 2021-12-04 NOTE — Patient Outreach (Signed)
  Care Coordination   Initial Visit Note   12/04/2021 Name: [name] MRN: [id number] DOB: March 18, 1955  [name] is a 66 y.o. year old male who sees Amponsah, Charisse March, MD for primary care. I spoke with  [name] by phone today.  What matters to the patients health and wellness today?  I need a Physiological scientist    Goals Addressed             This Visit's Progress    COMPLETED: Care Coordination Activites - No follow up required- PCS worker       Care Coordination Interventions: Solution-Focused Strategies employed:  Active listening / Reflection utilized  Problem [place] strategies reviewed During the conversation with the patient today, he mentioned that he doesn't require any assistance from me as he is already seeing a specialist for his back pain. However, he expressed his need for a PCS worker who can help him with cooking, cleaning, laundry, and meal preparation. Unfortunately, I am unable to provide assistance in this regard. The patient mentioned that he would be available for a call in the afternoon, so I have sent a communication to [place] to contact him.         SDOH assessments and interventions completed:  Yes  SDOH Interventions Today    Flowsheet Row Most Recent Value  SDOH Interventions   Food Insecurity Interventions Intervention Not Indicated  Transportation Interventions Intervention Not Indicated        Care Coordination Interventions Activated:  Yes  Care Coordination Interventions:  Yes, provided   Follow up plan: No further intervention required.   Encounter Outcome:  Pt. Visit Completed   Lazaro Arms RN, BSN, [facility]   Phone: [phone]

## 2021-12-04 NOTE — Patient Instructions (Signed)
Visit Information  Thank you for taking time to visit with me today. Please don't hesitate to contact me if I can be of assistance to you.   Following are the goals we discussed today:   Goals Addressed             This Visit's Progress    COMPLETED: Care Coordination Activites - No follow up required- PCS worker       Care Coordination Interventions: Solution-Focused Strategies employed:  Active listening / Reflection utilized  Problem [place] strategies reviewed During the conversation with the patient today, he mentioned that he doesn't require any assistance from me as he is already seeing a specialist for his back pain. However, he expressed his need for a PCS worker who can help him with cooking, cleaning, laundry, and meal preparation. Unfortunately, I am unable to provide assistance in this regard. The patient mentioned that he would be available for a call in the afternoon, so I have sent a communication to [place] to contact him.          If you are experiencing a Mental Health or [place] or need someone to talk to, please call 1-800-273-TALK (toll free, 24 hour hotline)  Patient verbalizes understanding of instructions and care plan provided today and agrees to view in [place]. Active MyChart status and patient understanding of how to access instructions and care plan via MyChart confirmed with patient.     Lazaro Arms RN, BSN, [facility]   Phone: [phone]

## 2021-12-05 ENCOUNTER — Telehealth: Payer: Self-pay | Admitting: *Deleted

## 2021-12-05 DIAGNOSIS — M7918 Myalgia, other site: Secondary | ICD-10-CM | POA: Diagnosis not present

## 2021-12-05 DIAGNOSIS — Z79899 Other long term (current) drug therapy: Secondary | ICD-10-CM | POA: Diagnosis not present

## 2021-12-05 DIAGNOSIS — M47816 Spondylosis without myelopathy or radiculopathy, lumbar region: Secondary | ICD-10-CM | POA: Diagnosis not present

## 2021-12-05 DIAGNOSIS — M5416 Radiculopathy, lumbar region: Secondary | ICD-10-CM | POA: Diagnosis not present

## 2021-12-05 DIAGNOSIS — G8929 Other chronic pain: Secondary | ICD-10-CM | POA: Diagnosis not present

## 2021-12-05 NOTE — Chronic Care Management (AMB) (Signed)
  Care Coordination Note  12/05/2021 Name: [name] MRN: [id number] DOB: [birth year]  [name] is a 66 y.o. year old male who is a primary care patient of Coy Saunas, Charisse March, MD and is actively engaged with the care management team. I reached out to [name] by phone today to assist with re-scheduling a follow up visit with the BSW  Follow up plan: Unsuccessful telephone outreach attempt made. A HIPAA compliant phone message was left for the patient providing contact information and requesting a return call.   [place]  Direct Dial: [phone]

## 2021-12-06 NOTE — Telephone Encounter (Signed)
  Care Coordination Note  12/06/2021 Name: [name] MRN: [id number] DOB: 28-Apr-1955  [name] is a 66 y.o. year old male who is a primary care patient of Coy Saunas, Charisse March, MD and is actively engaged with the care management team. I reached out to [name] by phone today to assist with re-scheduling a follow up visit with the BSW  Follow up plan: Telephone appointment with care management team member scheduled for:12/09/21  [place]: [phone]

## 2021-12-09 ENCOUNTER — Ambulatory Visit: Payer: Self-pay | Admitting: Licensed Clinical Social Worker

## 2021-12-10 NOTE — Patient Instructions (Signed)
Visit Information  Thank you for taking time to visit with me today. Please don't hesitate to contact me if I can be of assistance to you.   Following are the goals we discussed today:   Goals Addressed               This [place] (pt-stated)        Patient requesting assistance with Cooking, Cleaning and additional ADL's. Patient experiencing pain on a scale of 10 in back. Patient expressed constantly being out of breath with minimum movement. Patient expressed inability to clean and cook for self.   SW sent message for [facility] to outreach patient.   SW will assist with PCS application.           Patient verbalizes understanding of instructions and care plan provided today and agrees to view in [place]. Active MyChart status and patient understanding of how to access instructions and care plan via MyChart confirmed with patient.     The care management team will reach out to the patient again over the next 14 days.   Milus Height, Arita Miss, MSW, [place]  Social Worker [facility] Care Management  [phone]

## 2021-12-10 NOTE — Patient Outreach (Signed)
  Care Coordination   Follow Up Visit Note   12/09/2021 Name: [name] MRN: [id number] DOB: 09-May-1955  [name] is a 66 y.o. year old male who sees Amponsah, Charisse March, MD for primary care. I spoke with  [name] by phone today.  What matters to the patients health and wellness today?  PCS    Goals Addressed               This Visit's Progress     Care Coordination Activities- Home Health (pt-stated)        Patient requesting assistance with Cooking, Cleaning and additional ADL's. Patient experiencing pain on a scale of 10 in back. Patient expressed constantly being out of breath with minimum movement. Patient expressed inability to clean and cook for self.   SW sent message for [facility] to outreach patient.   SW will assist with PCS application.         SDOH assessments and interventions completed:  Yes     Care Coordination Interventions Activated:  Yes  Care Coordination Interventions:  Yes, provided   Follow up plan: Follow up call scheduled for within the next 14 days    Encounter Outcome:  Pt. Visit Completed

## 2021-12-12 ENCOUNTER — Other Ambulatory Visit: Payer: Self-pay

## 2021-12-12 ENCOUNTER — Ambulatory Visit
Admission: RE | Admit: 2021-12-12 | Discharge: 2021-12-12 | Disposition: A | Discharge status: Home / Self Care | Payer: Medicare Other | Source: Ambulatory Visit | Attending: Urology | Admitting: Urology

## 2021-12-12 DIAGNOSIS — Z85528 Personal history of other malignant neoplasm of kidney: Secondary | ICD-10-CM | POA: Diagnosis not present

## 2021-12-12 DIAGNOSIS — C642 Malignant neoplasm of left kidney, except renal pelvis: Secondary | ICD-10-CM

## 2021-12-12 DIAGNOSIS — D49511 Neoplasm of unspecified behavior of right kidney: Secondary | ICD-10-CM

## 2021-12-12 MED ORDER — GADOPICLENOL 0.5 MMOL/ML IV SOLN
8.0000 mL | Freq: Once | INTRAVENOUS | Status: AC | PRN
  Administered 2021-12-12: 8 mL via INTRAVENOUS

## 2021-12-12 NOTE — Telephone Encounter (Signed)
fluticasone (FLONASE) 50 MCG/ACT nasal spray, refill request @  [place] [facility] ([place]), [place] - [address]

## 2021-12-13 MED ORDER — FLUTICASONE PROPIONATE 50 MCG/ACT NA SUSP: 2.0000 | Freq: Every day | NASAL | 2 refills | Status: DC

## 2021-12-16 DIAGNOSIS — M533 Sacrococcygeal disorders, not elsewhere classified: Secondary | ICD-10-CM | POA: Diagnosis not present

## 2021-12-23 DIAGNOSIS — C642 Malignant neoplasm of left kidney, except renal pelvis: Secondary | ICD-10-CM | POA: Diagnosis not present

## 2021-12-23 DIAGNOSIS — R3915 Urgency of urination: Secondary | ICD-10-CM | POA: Diagnosis not present

## 2021-12-24 DIAGNOSIS — M5416 Radiculopathy, lumbar region: Secondary | ICD-10-CM | POA: Diagnosis not present

## 2021-12-25 DIAGNOSIS — J441 Chronic obstructive pulmonary disease with (acute) exacerbation: Secondary | ICD-10-CM | POA: Diagnosis not present

## 2021-12-26 DIAGNOSIS — J432 Centrilobular emphysema: Secondary | ICD-10-CM | POA: Diagnosis not present

## 2021-12-31 ENCOUNTER — Ambulatory Visit: Payer: Self-pay | Admitting: Licensed Clinical Social Worker

## 2021-12-31 ENCOUNTER — Other Ambulatory Visit: Payer: Self-pay | Admitting: Student

## 2021-12-31 ENCOUNTER — Telehealth: Payer: Self-pay | Admitting: *Deleted

## 2021-12-31 NOTE — Telephone Encounter (Signed)
Received faxed PSA result from [facility]. 0.68 ng/mL collected 12/23/21. Placed in [place] box for review and signature.

## 2021-12-31 NOTE — Patient Outreach (Signed)
  Care Coordination   Follow Up Visit Note   12/31/2021 Name: [name] MRN: [id number] DOB: 08/17/1955  [name] is a 66 y.o. year old male who sees Amponsah, Charisse March, MD for primary care. I  completed PCS application  What matters to the patients health and wellness today?  PCS application   Goals Addressed               This Visit's Progress     Care Coordination Activities- Home Health (pt-stated)        PCS application updated and placed in PCP box for completion.          SDOH assessments and interventions completed:  Yes     Care Coordination Interventions:  Yes, provided   Follow up plan: No further intervention required.   Encounter Outcome:  Pt. Visit Completed
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
## 2022-01-01 ENCOUNTER — Other Ambulatory Visit: Payer: Self-pay | Admitting: *Deleted

## 2022-01-01 DIAGNOSIS — M5416 Radiculopathy, lumbar region: Secondary | ICD-10-CM | POA: Diagnosis not present

## 2022-01-01 MED ORDER — ROSUVASTATIN CALCIUM 20 MG PO TABS: 20.0000 mg | ORAL_TABLET | Freq: Every day | ORAL | 3 refills | Status: DC

## 2022-01-01 NOTE — Telephone Encounter (Signed)
Call from [facility] rep - stated pt is requesting a 90 day supply on Crestor. Thanks

## 2022-01-03 ENCOUNTER — Encounter: Payer: Self-pay | Admitting: *Deleted

## 2022-01-03 DIAGNOSIS — Z7409 Other reduced mobility: Secondary | ICD-10-CM | POA: Diagnosis not present

## 2022-01-06 ENCOUNTER — Ambulatory Visit (INDEPENDENT_AMBULATORY_CARE_PROVIDER_SITE_OTHER)
Admission: RE | Admit: 2022-01-06 | Discharge: 2022-01-06 | Disposition: A | Discharge status: Home / Self Care | Payer: Medicare Other | Source: Ambulatory Visit | Attending: Physician Assistant | Admitting: Physician Assistant

## 2022-01-06 ENCOUNTER — Ambulatory Visit (INDEPENDENT_AMBULATORY_CARE_PROVIDER_SITE_OTHER): Payer: Medicare Other | Admitting: Physician Assistant

## 2022-01-06 ENCOUNTER — Other Ambulatory Visit (INDEPENDENT_AMBULATORY_CARE_PROVIDER_SITE_OTHER): Payer: Medicare Other

## 2022-01-06 ENCOUNTER — Encounter: Payer: Self-pay | Admitting: Physician Assistant

## 2022-01-06 VITALS — BP 118/68 | HR 73 | Ht 69.0 in | Wt 161.6 lb

## 2022-01-06 DIAGNOSIS — K580 Irritable bowel syndrome with diarrhea: Secondary | ICD-10-CM

## 2022-01-06 DIAGNOSIS — K638219 Small intestinal bacterial overgrowth, unspecified: Secondary | ICD-10-CM | POA: Diagnosis not present

## 2022-01-06 DIAGNOSIS — R1084 Generalized abdominal pain: Secondary | ICD-10-CM

## 2022-01-06 DIAGNOSIS — R110 Nausea: Secondary | ICD-10-CM

## 2022-01-06 DIAGNOSIS — R109 Unspecified abdominal pain: Secondary | ICD-10-CM | POA: Diagnosis not present

## 2022-01-06 LAB — CBC WITH DIFFERENTIAL/PLATELET
Basophils Absolute: 0.1 K/uL (ref 0.0–0.1)
Basophils Relative: 0.7 % (ref 0.0–3.0)
Eosinophils Absolute: 0.1 K/uL (ref 0.0–0.7)
Eosinophils Relative: 1.4 % (ref 0.0–5.0)
HCT: 45.1 % (ref 39.0–52.0)
Hemoglobin: 15 g/dL (ref 13.0–17.0)
Lymphocytes Relative: 10.5 % — ABNORMAL LOW (ref 12.0–46.0)
Lymphs Abs: 1 K/uL (ref 0.7–4.0)
MCHC: 33.2 g/dL (ref 30.0–36.0)
MCV: 93.4 fl (ref 78.0–100.0)
Monocytes Absolute: 0.9 K/uL (ref 0.1–1.0)
Monocytes Relative: 9 % (ref 3.0–12.0)
Neutro Abs: 7.4 K/uL (ref 1.4–7.7)
Neutrophils Relative %: 78.4 % — ABNORMAL HIGH (ref 43.0–77.0)
Platelets: 264 K/uL (ref 150.0–400.0)
RBC: 4.83 Mil/uL (ref 4.22–5.81)
RDW: 16.6 % — ABNORMAL HIGH (ref 11.5–15.5)
WBC: 9.5 K/uL (ref 4.0–10.5)

## 2022-01-06 LAB — COMPREHENSIVE METABOLIC PANEL
ALT: 13 U/L (ref 0–53)
AST: 12 U/L (ref 0–37)
Albumin: 4.2 g/dL (ref 3.5–5.2)
Alkaline Phosphatase: 95 U/L (ref 39–117)
BUN: 11 mg/dL (ref 6–23)
CO2: 29 mEq/L (ref 19–32)
Calcium: 9.5 mg/dL (ref 8.4–10.5)
Chloride: 101 mEq/L (ref 96–112)
Creatinine, Ser: 1.22 mg/dL (ref 0.40–1.50)
GFR: 61.63 mL/min (ref >60.00)
Glucose, Bld: 94 mg/dL (ref 70–99)
Potassium: 4.5 mEq/L (ref 3.5–5.1)
Sodium: 137 mEq/L (ref 135–145)
Total Bilirubin: 1 mg/dL (ref 0.2–1.2)
Total Protein: 7.3 g/dL (ref 6.0–8.3)

## 2022-01-06 LAB — HIGH SENSITIVITY CRP: CRP, High Sensitivity: 55.23 mg/L — ABNORMAL HIGH (ref 0.000–5.000)

## 2022-01-06 LAB — CORTISOL: Cortisol, Plasma: 2.1 ug/dL

## 2022-01-06 LAB — TSH: TSH: 1.35 u[IU]/mL (ref 0.35–5.50)

## 2022-01-06 LAB — LIPASE: Lipase: 22 U/L (ref 11.0–59.0)

## 2022-01-06 LAB — SEDIMENTATION RATE: Sed Rate: 20 mm/h (ref 0–20)

## 2022-01-06 MED ORDER — ONDANSETRON HCL 4 MG PO TABS: 4.0000 mg | ORAL_TABLET | Freq: Three times a day (TID) | ORAL | 1 refills | Status: DC | PRN

## 2022-01-06 MED ORDER — SUCRALFATE 1 G PO TABS: 1.0000 g | ORAL_TABLET | Freq: Three times a day (TID) | ORAL | 0 refills | Status: DC

## 2022-01-06 MED ORDER — OMEPRAZOLE 40 MG PO CPDR: 40.0000 mg | DELAYED_RELEASE_CAPSULE | Freq: Every day | ORAL | 3 refills | Status: DC

## 2022-01-06 NOTE — Progress Notes (Signed)
Attending Physician's Attestation   I have reviewed the chart.   I agree with the Advanced Practitioner's note, impression, and recommendations with any updates as below.    Anterio Scheel Mansouraty, MD [facility] Advanced [facility]oscopy Office # [phone]  

## 2022-01-06 NOTE — Patient Instructions (Addendum)
Follow up with Dr Rush Landmark. 04/02/22 at 2:10 pm.  Your provider has requested that you go to the basement level for lab work before leaving today. Press "B" on the elevator. The lab is located at the first door on the left as you exit the elevator.  Your provider has requested that you have an abdominal x ray before leaving today. Please go to the basement floor to our Radiology department for the test.  Finish the xifaxin for possible small intestinal bacterial overgrowth- this is only for 2 weeks  Please take your proton pump inhibitor medication, omeprazole Can also take the carafate  Please take this medication 30 minutes to 1 hour before meals- this makes it more effective.  Avoid spicy and acidic foods Avoid fatty foods Limit your intake of coffee, tea, alcohol, and carbonated drinks Work to maintain a healthy weight Keep the head of the bed elevated at least 3 inches with blocks or a wedge pillow if you are having any nighttime symptoms Stay upright for 2 hours after eating Avoid meals and snacks three to four hours before bedtime  Please stop the mobic/meloxicam.  Please go to the ER if you have any severe AB pain, unable to hold down food/water, blood in stool or vomit, chest pain, shortness of breath, or any worsening symptoms.

## 2022-01-13 ENCOUNTER — Ambulatory Visit: Payer: Medicare Other | Admitting: Nurse Practitioner
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
## 2022-01-15 DIAGNOSIS — Z7409 Other reduced mobility: Secondary | ICD-10-CM | POA: Diagnosis not present

## 2022-01-17 DIAGNOSIS — R350 Frequency of micturition: Secondary | ICD-10-CM | POA: Diagnosis not present

## 2022-01-17 DIAGNOSIS — R3915 Urgency of urination: Secondary | ICD-10-CM | POA: Diagnosis not present

## 2022-01-23 DIAGNOSIS — M47816 Spondylosis without myelopathy or radiculopathy, lumbar region: Secondary | ICD-10-CM | POA: Diagnosis not present

## 2022-01-23 DIAGNOSIS — G8929 Other chronic pain: Secondary | ICD-10-CM | POA: Diagnosis not present

## 2022-01-23 DIAGNOSIS — M7918 Myalgia, other site: Secondary | ICD-10-CM | POA: Diagnosis not present

## 2022-01-23 DIAGNOSIS — G8912 Acute post-thoracotomy pain: Secondary | ICD-10-CM | POA: Diagnosis not present

## 2022-01-23 DIAGNOSIS — M5416 Radiculopathy, lumbar region: Secondary | ICD-10-CM | POA: Diagnosis not present

## 2022-01-24 DIAGNOSIS — J441 Chronic obstructive pulmonary disease with (acute) exacerbation: Secondary | ICD-10-CM | POA: Diagnosis not present

## 2022-01-25 DIAGNOSIS — J432 Centrilobular emphysema: Secondary | ICD-10-CM | POA: Diagnosis not present

## 2022-01-29 DIAGNOSIS — Z7409 Other reduced mobility: Secondary | ICD-10-CM | POA: Diagnosis not present

## 2022-01-30 ENCOUNTER — Emergency Department (HOSPITAL_BASED_OUTPATIENT_CLINIC_OR_DEPARTMENT_OTHER)
Admission: EM | Admit: 2022-01-30 | Discharge: 2022-01-30 | Disposition: A | Discharge status: Home / Self Care | Payer: Medicare Other | Attending: Emergency Medicine | Admitting: Emergency Medicine

## 2022-01-30 ENCOUNTER — Emergency Department (HOSPITAL_BASED_OUTPATIENT_CLINIC_OR_DEPARTMENT_OTHER): Payer: Medicare Other | Admitting: Radiology

## 2022-01-30 ENCOUNTER — Other Ambulatory Visit: Payer: Self-pay

## 2022-01-30 ENCOUNTER — Emergency Department (HOSPITAL_BASED_OUTPATIENT_CLINIC_OR_DEPARTMENT_OTHER): Payer: Medicare Other

## 2022-01-30 ENCOUNTER — Encounter (HOSPITAL_BASED_OUTPATIENT_CLINIC_OR_DEPARTMENT_OTHER): Payer: Self-pay | Admitting: Emergency Medicine

## 2022-01-30 DIAGNOSIS — Z85118 Personal history of other malignant neoplasm of bronchus and lung: Secondary | ICD-10-CM | POA: Diagnosis not present

## 2022-01-30 DIAGNOSIS — Z1152 Encounter for screening for COVID-19: Secondary | ICD-10-CM | POA: Diagnosis not present

## 2022-01-30 DIAGNOSIS — J181 Lobar pneumonia, unspecified organism: Secondary | ICD-10-CM | POA: Diagnosis not present

## 2022-01-30 DIAGNOSIS — J189 Pneumonia, unspecified organism: Secondary | ICD-10-CM

## 2022-01-30 DIAGNOSIS — J439 Emphysema, unspecified: Secondary | ICD-10-CM | POA: Diagnosis not present

## 2022-01-30 DIAGNOSIS — R079 Chest pain, unspecified: Secondary | ICD-10-CM | POA: Diagnosis not present

## 2022-01-30 DIAGNOSIS — R059 Cough, unspecified: Secondary | ICD-10-CM | POA: Diagnosis not present

## 2022-01-30 LAB — CBC
HCT: 44.3 % (ref 39.0–52.0)
Hemoglobin: 14.7 g/dL (ref 13.0–17.0)
MCH: 31.5 pg (ref 26.0–34.0)
MCHC: 33.2 g/dL (ref 30.0–36.0)
MCV: 95.1 fL (ref 80.0–100.0)
Platelets: 281 10*3/uL (ref 150–400)
RBC: 4.66 MIL/uL (ref 4.22–5.81)
RDW: 15.3 % (ref 11.5–15.5)
WBC: 25.6 10*3/uL — ABNORMAL HIGH (ref 4.0–10.5)
nRBC: 0 % (ref 0.0–0.2)

## 2022-01-30 LAB — BASIC METABOLIC PANEL
Anion gap: 12 (ref 5–15)
BUN: 16 mg/dL (ref 8–23)
CO2: 23 mmol/L (ref 22–32)
Calcium: 9.7 mg/dL (ref 8.9–10.3)
Chloride: 100 mmol/L (ref 98–111)
Creatinine, Ser: 1.34 mg/dL — ABNORMAL HIGH (ref 0.61–1.24)
GFR, Estimated: 58 mL/min — ABNORMAL LOW (ref >60)
Glucose, Bld: 83 mg/dL (ref 70–99)
Potassium: 4.2 mmol/L (ref 3.5–5.1)
Sodium: 135 mmol/L (ref 135–145)

## 2022-01-30 LAB — RESP PANEL BY RT-PCR (RSV, FLU A&B, COVID)  RVPGX2
Influenza A by PCR: NEGATIVE
Influenza B by PCR: NEGATIVE
Resp Syncytial Virus by PCR: NEGATIVE
SARS Coronavirus 2 by RT PCR: NEGATIVE

## 2022-01-30 LAB — TROPONIN I (HIGH SENSITIVITY)
Troponin I (High Sensitivity): 5 ng/L (ref <18)
Troponin I (High Sensitivity): 5 ng/L (ref <18)

## 2022-01-30 MED ORDER — MORPHINE SULFATE (PF) 4 MG/ML IV SOLN
4.0000 mg | Freq: Once | INTRAVENOUS | Status: AC
  Administered 2022-01-30: 4 mg via INTRAVENOUS
  Filled 2022-01-30: qty 1

## 2022-01-30 MED ORDER — IPRATROPIUM-ALBUTEROL 0.5-2.5 (3) MG/3ML IN SOLN
3.0000 mL | RESPIRATORY_TRACT | Status: AC
  Administered 2022-01-30 (×3): 3 mL via RESPIRATORY_TRACT
  Filled 2022-01-30: qty 9

## 2022-01-30 MED ORDER — AZITHROMYCIN 250 MG PO TABS: 250.0000 mg | ORAL_TABLET | Freq: Every day | ORAL | 0 refills | Status: DC

## 2022-01-30 MED ORDER — IOHEXOL 350 MG/ML SOLN
100.0000 mL | Freq: Once | INTRAVENOUS | Status: AC | PRN
  Administered 2022-01-30: 75 mL via INTRAVENOUS

## 2022-01-30 MED ORDER — ONDANSETRON HCL 4 MG/2ML IJ SOLN
4.0000 mg | Freq: Once | INTRAMUSCULAR | Status: AC
  Administered 2022-01-30: 4 mg via INTRAVENOUS
  Filled 2022-01-30: qty 2

## 2022-01-30 MED ORDER — AMOXICILLIN-POT CLAVULANATE 875-125 MG PO TABS: 1.0000 | ORAL_TABLET | Freq: Two times a day (BID) | ORAL | 0 refills | Status: DC

## 2022-01-30 NOTE — ED Triage Notes (Signed)
Arrives c/o cough, blood tinged. Headache and fatigue "Flu-like symptoms" On chronic o2.

## 2022-01-30 NOTE — ED Notes (Signed)
RT note: Pt. placed in rm.16, placed on monitor with v/s updated.

## 2022-01-30 NOTE — Discharge Instructions (Signed)
Call your pulmonologist tomorrow and have them know how you are doing.  Please return for worsening difficulty breathing confusion.  Please discuss with your pain management doctor about your increased discomfort and see what they can do to change your medications for home.

## 2022-01-30 NOTE — ED Notes (Signed)
Patient transported to CT 

## 2022-01-30 NOTE — ED Provider Notes (Signed)
[name] Provider Note   CSN: [id number] Arrival date & time: 01/30/22  1446     History  Chief Complaint  Patient presents with   Cough    [name] is a 66 y.o. male.  66 yo M with a cc of cough, sob.  Going on for the past couple days.  He has been having increased cough and sputum production.  He had seen his pulmonologist recently and has been on a course of steroids.  He also was on a burst dose of steroids for his chronic pain.  He sees pain management for this.  Started having pain to both sides of his chest wall.  Has been coughing up a little bit of blood as well.  Has a history of lung cancer.   Cough      Home Medications Prior to Admission medications   Medication Sig Start Date End Date Taking? Authorizing Provider  amoxicillin-clavulanate (AUGMENTIN) 875-125 MG tablet Take 1 tablet by mouth every 12 (twelve) hours. 01/30/22  Yes Deno Etienne, DO  azithromycin (ZITHROMAX) 250 MG tablet Take 1 tablet (250 mg total) by mouth daily. Take first 2 tablets together, then 1 every day until finished. 01/30/22  Yes Deno Etienne, DO  acetaminophen (TYLENOL) 500 MG tablet Take 500 mg by mouth every 6 (six) hours as needed for mild pain or headache.     [provider]  albuterol (PROVENTIL HFA) 108 (90 Base) MCG/ACT inhaler INHALE 2 PUFFS BY MOUTH EVERY 6 HOURS AS NEEDED FOR WHEEZING OR SHORTNESS OF BREATH 11/27/21   Collene Gobble, MD  albuterol (PROVENTIL) (2.5 MG/3ML) 0.083% nebulizer solution Take 3 mLs (2.5 mg total) by nebulization every 6 (six) hours as needed for wheezing or shortness of breath. 11/27/21   Collene Gobble, MD  carboxymethylcellulose (REFRESH PLUS) 0.5 % SOLN Place 1 drop into both eyes 3 (three) times daily as needed (dry eyes).    [provider]  cefUROXime (CEFTIN) 250 MG tablet 250 mg twice a day for 7 days (February, May, August, November) 03/01/21   Collene Gobble, MD  cefUROXime (CEFTIN) 250 MG  tablet Take 1 tablet (250 mg total) by mouth 2 (two) times daily with a meal. 11/27/21   Byrum, Rose Fillers, MD  dicyclomine (BENTYL) 10 MG capsule Take 1 capsule (10 mg total) by mouth in the morning and at bedtime. 11/19/21   Mansouraty, Telford Nab., MD  doxycycline (VIBRA-TABS) 100 MG tablet 100 mg twice a day for 7 days (April, July, October, January) 11/27/21   Collene Gobble, MD  DULoxetine (CYMBALTA) 30 MG capsule Take 1 capsule (30 mg total) by mouth daily. 08/20/21   Katsadouros, Vasilios, MD  eszopiclone (LUNESTA) 1 MG TABS tablet Take 1 tablet (1 mg total) by mouth at bedtime as needed for sleep. Take immediately before bedtime 03/29/19   Sherrilyn Rist A, MD  fexofenadine (ALLEGRA ALLERGY) 180 MG tablet Take 1 tablet (180 mg total) by mouth daily. 12/13/20   Sanjuan Dame, MD  FLUoxetine (PROZAC) 10 MG capsule Take 10 mg by mouth daily as needed (Depression). 12/06/19   [provider]  fluticasone (FLONASE) 50 MCG/ACT nasal spray Place 2 sprays into both nostrils daily. 12/13/21   Lacinda Axon, MD  Fluticasone-Umeclidin-Vilant (TRELEGY ELLIPTA) 100-62.5-25 MCG/ACT AEPB Inhale 1 puff into the lungs daily. 11/27/21   Collene Gobble, MD  Fluticasone-Umeclidin-Vilant 200-62.5-25 MCG/ACT AEPB Inhale 1 puff into the lungs daily. 10/17/21   Collene Gobble,  MD  Guaifenesin 200 MG/5ML LIQD Take 10 mLs (400 mg total) by mouth every 6 (six) hours as needed. 10/24/20   Martyn Ehrich, NP  hydrOXYzine (ATARAX) 25 MG tablet Take 1 tablet (25 mg total) by mouth every 6 (six) hours as needed for anxiety (or sleep). 03/26/21   Lacinda Axon, MD  isosorbide mononitrate (IMDUR) 30 MG 24 hr tablet Take 0.5 tablets (15 mg total) by mouth daily. 10/09/21   Lenna Sciara, NP  ketoconazole (NIZORAL) 2 % shampoo Apply 1 application  topically 2 (two) times a week. 08/14/20   [provider]  meloxicam (MOBIC) 7.5 MG tablet Take 7.5 mg by mouth daily as needed for pain. 01/06/20    [provider]  mesalamine (LIALDA) 1.2 g EC tablet Take 2 tablets by mouth at bedtime. 11/19/21   Mansouraty, Telford Nab., MD  methocarbamol (ROBAXIN) 500 MG tablet Take 500 mg by mouth daily. 08/03/20   [provider]  naproxen sodium (ALEVE) 220 MG tablet Take 220 mg by mouth daily as needed.    [provider]  nitroGLYCERIN (NITROSTAT) 0.4 MG SL tablet Place 1 tablet (0.4 mg total) under the tongue every 5 (five) minutes as needed for chest pain. 08/19/21 11/17/21  Lenna Sciara, NP  omeprazole (PRILOSEC) 40 MG capsule Take 1 capsule (40 mg total) by mouth daily. 01/06/22   Vladimir Crofts, PA-C  ondansetron (ZOFRAN) 4 MG tablet Take 1 tablet (4 mg total) by mouth every 8 (eight) hours as needed for nausea or vomiting. 01/06/22   Vladimir Crofts, PA-C  OXYGEN Inhale 2 L into the lungs at bedtime.    [provider]  predniSONE (DELTASONE) 10 MG tablet Take 1 tablet (10 mg total) by mouth daily with breakfast. 11/27/21   Collene Gobble, MD  pregabalin (LYRICA) 100 MG capsule Take 100 mg by mouth daily. 04/29/19   [provider]  promethazine (PHENERGAN) 12.5 MG tablet Take 1 tablet (12.5 mg total) by mouth every 4 (four) hours as needed for nausea or vomiting. 06/01/19   Debbrah Alar, PA-C  rifaximin (XIFAXAN) 550 MG TABS tablet Take 1 tablet (550 mg total) by mouth 3 (three) times daily. 11/19/21   Mansouraty, Telford Nab., MD  rosuvastatin (CRESTOR) 20 MG tablet Take 1 tablet (20 mg total) by mouth daily. 01/01/22 01/01/23  Lacinda Axon, MD  Simethicone 125 MG TABS Take 1 tablet (125 mg total) by mouth 3 (three) times daily as needed. 06/06/20   Collene Gobble, MD  sucralfate (CARAFATE) 1 g tablet Take 1 tablet (1 g total) by mouth 4 (four) times daily -  with meals and at bedtime. 01/06/22 02/05/22  Vladimir Crofts, PA-C  tamsulosin (FLOMAX) 0.4 MG CAPS capsule Take 0.4 mg by mouth daily. 01/20/19   [provider]  traMADol (ULTRAM)  50 MG tablet Take 50 mg by mouth 2 (two) times daily as needed. 11/14/21   [provider]      Allergies    Patient has no known allergies.    Review of Systems   Review of Systems  Respiratory:  Positive for cough.     Physical Exam Updated Vital Signs BP 121/80   Pulse 74   Temp 98.5 F (36.9 C)   Resp 18   SpO2 95%  Physical Exam Vitals and nursing note reviewed.  Constitutional:      Appearance: He is well-developed.  HENT:     Head: Normocephalic and atraumatic.  Eyes:     Pupils: Pupils are equal, round, and reactive to light.  Neck:     Vascular: No JVD.  Cardiovascular:     Rate and Rhythm: Normal rate and regular rhythm.     Heart sounds: No murmur heard.    No friction rub. No gallop.  Pulmonary:     Effort: No respiratory distress.     Breath sounds: No wheezing.  Abdominal:     General: There is no distension.     Tenderness: There is no abdominal tenderness. There is no guarding or rebound.  Musculoskeletal:        General: Normal range of motion.     Cervical back: Normal range of motion and neck supple.  Skin:    Coloration: Skin is not pale.     Findings: No rash.  Neurological:     Mental Status: He is alert and oriented to person, place, and time.  Psychiatric:        Behavior: Behavior normal.     ED Results / Procedures / Treatments   Labs (all labs ordered are listed, but only abnormal results are displayed) Labs Reviewed  BASIC METABOLIC PANEL - Abnormal; Notable for the following components:      Result Value   Creatinine, Ser 1.34 (*)    GFR, Estimated 58 (*)    All other components within normal limits  CBC - Abnormal; Notable for the following components:   WBC 25.6 (*)    All other components within normal limits  RESP PANEL BY RT-PCR (RSV, FLU A&B, COVID)  [id number]  TROPONIN I (HIGH SENSITIVITY)  TROPONIN I (HIGH SENSITIVITY)    EKG EKG Interpretation  Date/Time:  Thursday January 30 2022 15:22:41  EST Ventricular Rate:  82 PR Interval:  136 QRS Duration: 80 QT Interval:  348 QTC Calculation: 406 R Axis:   -71 Text Interpretation: Normal sinus rhythm Pulmonary disease pattern Left anterior fascicular block Nonspecific ST abnormality Abnormal ECG No significant change since last tracing Confirmed by Deno Etienne [phone]) on 01/30/2022 4:44:02 PM  Radiology CT Angio Chest PE W and/or Wo Contrast  Result Date: 01/30/2022 CLINICAL DATA:  Cough. EXAM: CT ANGIOGRAPHY CHEST WITH CONTRAST TECHNIQUE: Multidetector CT imaging of the chest was performed using the standard protocol during bolus administration of intravenous contrast. Multiplanar CT image reconstructions and MIPs were obtained to evaluate the vascular anatomy. RADIATION DOSE REDUCTION: This exam was performed according to the departmental dose-optimization program which includes automated exposure control, adjustment of the mA and/or kV according to patient size and/or use of iterative reconstruction technique. CONTRAST:  1mL OMNIPAQUE IOHEXOL 350 MG/ML SOLN COMPARISON:  November 13, 2021. FINDINGS: Cardiovascular: Satisfactory opacification of the pulmonary arteries to the segmental level. No evidence of pulmonary embolism. Normal heart size. No pericardial effusion. Coronary artery calcifications are noted. Mediastinum/Nodes: No enlarged mediastinal, hilar, or axillary lymph nodes. Thyroid gland, trachea, and esophagus demonstrate no significant findings. Lungs/Pleura: No pneumothorax or pleural effusion is noted. Emphysematous disease is noted bilaterally. Status post left upper lobectomy. Interval development of mild bibasilar opacities, left greater than right, most consistent with pneumonia. Upper Abdomen: No acute abnormality. Musculoskeletal: No chest wall abnormality. No acute or significant osseous findings. Review of the MIP images confirms the above findings. IMPRESSION: No definite evidence of pulmonary embolus. Interval development  of mild bibasilar opacities are noted, left greater than right, most consistent with pneumonia. Coronary artery calcifications are noted suggesting coronary artery disease. Emphysema (ICD10-J43.9). Electronically Signed   By:  Marijo Conception M.D.   On: 01/30/2022 17:41   DG Chest 2 View  Result Date: 01/30/2022 CLINICAL DATA:  Cough, chest pain. EXAM: CHEST - 2 VIEW COMPARISON:  July 10, 2021. FINDINGS: The heart size and mediastinal contours are within normal limits. Stable emphysematous change. Minimal bibasilar scarring is noted. No acute pulmonary abnormality is noted. The visualized skeletal structures are unremarkable. IMPRESSION: Extensive emphysematous disease with minimal bibasilar scarring. No acute abnormality seen. Emphysema (ICD10-J43.9). Electronically Signed   By: Marijo Conception M.D.   On: 01/30/2022 15:49    Procedures Procedures    Medications Ordered in ED Medications  ipratropium-albuterol (DUONEB) 0.5-2.5 (3) MG/3ML nebulizer solution 3 mL (3 mLs Nebulization Given 01/30/22 1704)  morphine (PF) 4 MG/ML injection 4 mg (4 mg Intravenous Given 01/30/22 1707)  ondansetron (ZOFRAN) injection 4 mg (4 mg Intravenous Given 01/30/22 1706)  iohexol (OMNIPAQUE) 350 MG/ML injection 100 mL (75 mLs Intravenous Contrast Given 01/30/22 1716)    ED Course/ Medical Decision Making/ A&P                           Medical Decision Making Amount and/or Complexity of Data Reviewed Labs: ordered. Radiology: ordered.  Risk Prescription drug management.   66 yo M with a chief complaints of cough congestion rib pain and hemoptysis.  By history sounds most like bronchitis however has no wheezes on exam also has a significant leukocytosis.  He is stable on his 2 L of oxygen that he is on at all times at home.  I am concerned about a pulmonary embolism with his history of cancer and clear lung sounds.  CT scan of the chest is negative for PE but shows likely pneumonia.  Consistent with his  change in sputum and increased sputum.  I offered admission and discussed risks and benefits patient is electing for a trial of outpatient antibiotics.  Will have him follow-up with his pulmonologist in the office.  6:08 PM:  I have discussed the diagnosis/risks/treatment options with the patient.  Evaluation and diagnostic testing in the emergency department does not suggest an emergent condition requiring admission or immediate intervention beyond what has been performed at this time.  They will follow up with PCP, pulm. We also discussed returning to the ED immediately if new or worsening sx occur. We discussed the sx which are most concerning (e.g., sudden worsening pain, fever, inability to tolerate by mouth) that necessitate immediate return. Medications administered to the patient during their visit and any new prescriptions provided to the patient are listed below.  Medications given during this visit Medications  ipratropium-albuterol (DUONEB) 0.5-2.5 (3) MG/3ML nebulizer solution 3 mL (3 mLs Nebulization Given 01/30/22 1704)  morphine (PF) 4 MG/ML injection 4 mg (4 mg Intravenous Given 01/30/22 1707)  ondansetron (ZOFRAN) injection 4 mg (4 mg Intravenous Given 01/30/22 1706)  iohexol (OMNIPAQUE) 350 MG/ML injection 100 mL (75 mLs Intravenous Contrast Given 01/30/22 1716)     The patient appears reasonably screen and/or stabilized for discharge and I doubt any other medical condition or other [facility] requiring further screening, evaluation, or treatment in the ED at this time prior to discharge.          Final Clinical Impression(s) / ED Diagnoses Final diagnoses:  Community acquired pneumonia of right lower lobe of lung    Rx / DC Orders ED Discharge Orders          Ordered    amoxicillin-clavulanate (  AUGMENTIN) 875-125 MG tablet  Every 12 hours        01/30/22 1758    azithromycin (ZITHROMAX) 250 MG tablet  Daily        01/30/22 1758              Deno Etienne,  DO 01/30/22 1808

## 2022-01-30 NOTE — ED Notes (Signed)
RT note: Pt. placed on 2 lpm after arrival in Triage by RN Tech, is [facility] pt. currently being seen by Dr. Lamonte Sakai,  arriving this date due to >'d s.o.b. along with >'d # of blood being coughed up, (+) hx. of Lung CA,pulmonary meds taking this date with RT assessment obtained.

## 2022-01-31 ENCOUNTER — Telehealth: Payer: Self-pay

## 2022-01-31 ENCOUNTER — Telehealth: Payer: Self-pay | Admitting: Emergency Medicine

## 2022-01-31 MED ORDER — PREDNISONE 10 MG PO TABS: ORAL_TABLET | ORAL | 0 refills | Status: DC

## 2022-01-31 NOTE — Patient Outreach (Signed)
  Care Coordination [facility] Note Transition Care Management Unsuccessful Follow-up Telephone Call  Date of discharge and from where:  [facility] 01/30/22  Attempts:  1st Attempt  Reason for unsuccessful TCM follow-up call:  Left voice message  Johnney Killian, RN, BSN, CCM Care Management Coordinator [facility] Phone: [phone]: [phone]

## 2022-01-31 NOTE — Telephone Encounter (Signed)
Spoke with patient, advised of Dr. Melvyn Novas recommendations. Patient verbalized understanding. Nothing further needed.

## 2022-01-31 NOTE — Patient Outreach (Signed)
  Care Coordination [facility] Note Transition Care Management Follow-up Telephone Call Date of discharge and from where: [facility] 01/30/22 How have you been since you were released from the hospital? "I still feel terrible overall" Any questions or concerns? Yes- Patient has not gone to pick up his 2 antibiotics yet. He has a friend who is driving him.  He was also looking for a prescription for Ibuprofen and explained a doctor would need to order (to go through insurance) or he could pick up a small bottle over the counter until he is seen by [facility].  This Probation officer stressed the importance of taking his antibiotic as directed.  Patient was also calling Dr. Lamonte Sakai today as noted in dc instructions.  Items Reviewed: Did the pt receive and understand the discharge instructions provided? Yes  Medications obtained and verified? No -Patient picking up this afternoon. He does have oxygen at home. Other? No  Any new allergies since your discharge? No  Dietary orders reviewed? No Do you have support at home? Yes   Home Care and Equipment/Supplies: Were home health services ordered? no If so, what is the name of the agency? N/a  Has the agency set up a time to come to the patient's home? not applicable Were any new equipment or medical supplies ordered?  No What is the name of the medical supply agency? N/a Were you able to get the supplies/equipment? not applicable Do you have any questions related to the use of the equipment or supplies? No  Functional Questionnaire: (I = Independent and D = Dependent) ADLs: I  Bathing/Dressing- I  Meal Prep- I  Eating- I  Maintaining continence- I  Transferring/Ambulation- I  Managing Meds- I  Follow up appointments reviewed:  PCP Hospital f/u appt confirmed? No   Specialist Hospital f/u appt confirmed? Yes  Scheduled to see Dr. Larina Earthly on 02/19/21 @ 1:30. Are transportation arrangements needed? No  If their condition worsens, is the pt aware to  call PCP or go to the Emergency Dept.? Yes Was the patient provided with contact information for the PCP's office or ED? Yes Was to pt encouraged to call back with questions or concerns? Yes  SDOH assessments and interventions completed:   Yes SDOH Interventions Today    Flowsheet Row Most Recent Value  SDOH Interventions   Housing Interventions Intervention Not Indicated  Transportation Interventions Other (Comment)  [Patient has a friend that can bring him to appointments]  Utilities Interventions Intervention Not Indicated       Care Coordination Interventions:  PCP follow up appointment requested   Encounter Outcome:  Pt. Visit Completed

## 2022-01-31 NOTE — Telephone Encounter (Signed)
Spoke with pt who states he went to [facility] ED yesterday and was diagnosed with CAP. Pt was given an ABT which he is currently taking. Pt stated that he has been coughing up some blood the last 2 days. Pt stated he told ED about coughing up blood and pt was told to contact us. Pt was scheduled for the first available OV which is 02/18/21. Pt told that if he continued to cough up blood he needs to go to UC for evaluation. Dr. Tressa Busman can you please advise on situation as Dr. Lamonte Sakai is unavailable today.

## 2022-01-31 NOTE — Telephone Encounter (Signed)
Attempted to call. Prescribed prednisone taper to take with ABX. If coughing up large amount of blood filling up half a cup or greater or if develops trouble breathing along with it to extent he has trouble completing sentences then needs to head to ED.

## 2022-02-03 ENCOUNTER — Other Ambulatory Visit: Payer: Self-pay

## 2022-02-03 ENCOUNTER — Inpatient Hospital Stay (HOSPITAL_COMMUNITY)
Admission: EM | Admit: 2022-02-03 | Discharge: 2022-02-08 | DRG: 190 | Disposition: A | Discharge status: Home / Self Care | Payer: 59 | Attending: Internal Medicine | Admitting: Internal Medicine

## 2022-02-03 ENCOUNTER — Emergency Department (HOSPITAL_COMMUNITY): Payer: 59

## 2022-02-03 ENCOUNTER — Encounter (HOSPITAL_COMMUNITY): Payer: Self-pay

## 2022-02-03 DIAGNOSIS — J189 Pneumonia, unspecified organism: Secondary | ICD-10-CM | POA: Diagnosis not present

## 2022-02-03 DIAGNOSIS — K219 Gastro-esophageal reflux disease without esophagitis: Secondary | ICD-10-CM | POA: Diagnosis not present

## 2022-02-03 DIAGNOSIS — J440 Chronic obstructive pulmonary disease with acute lower respiratory infection: Secondary | ICD-10-CM | POA: Diagnosis not present

## 2022-02-03 DIAGNOSIS — Z7952 Long term (current) use of systemic steroids: Secondary | ICD-10-CM

## 2022-02-03 DIAGNOSIS — J441 Chronic obstructive pulmonary disease with (acute) exacerbation: Secondary | ICD-10-CM | POA: Diagnosis not present

## 2022-02-03 DIAGNOSIS — J439 Emphysema, unspecified: Secondary | ICD-10-CM | POA: Diagnosis present

## 2022-02-03 DIAGNOSIS — Z79899 Other long term (current) drug therapy: Secondary | ICD-10-CM

## 2022-02-03 DIAGNOSIS — I2510 Atherosclerotic heart disease of native coronary artery without angina pectoris: Secondary | ICD-10-CM | POA: Diagnosis present

## 2022-02-03 DIAGNOSIS — Z87891 Personal history of nicotine dependence: Secondary | ICD-10-CM | POA: Diagnosis not present

## 2022-02-03 DIAGNOSIS — Z7951 Long term (current) use of inhaled steroids: Secondary | ICD-10-CM

## 2022-02-03 DIAGNOSIS — Z981 Arthrodesis status: Secondary | ICD-10-CM | POA: Diagnosis not present

## 2022-02-03 DIAGNOSIS — D649 Anemia, unspecified: Secondary | ICD-10-CM | POA: Diagnosis not present

## 2022-02-03 DIAGNOSIS — J209 Acute bronchitis, unspecified: Secondary | ICD-10-CM | POA: Diagnosis not present

## 2022-02-03 DIAGNOSIS — I252 Old myocardial infarction: Secondary | ICD-10-CM | POA: Diagnosis not present

## 2022-02-03 DIAGNOSIS — E861 Hypovolemia: Secondary | ICD-10-CM | POA: Diagnosis present

## 2022-02-03 DIAGNOSIS — J9611 Chronic respiratory failure with hypoxia: Secondary | ICD-10-CM | POA: Diagnosis present

## 2022-02-03 DIAGNOSIS — E871 Hypo-osmolality and hyponatremia: Secondary | ICD-10-CM | POA: Diagnosis not present

## 2022-02-03 DIAGNOSIS — Z8701 Personal history of pneumonia (recurrent): Secondary | ICD-10-CM

## 2022-02-03 DIAGNOSIS — Z9981 Dependence on supplemental oxygen: Secondary | ICD-10-CM | POA: Diagnosis not present

## 2022-02-03 DIAGNOSIS — Z1152 Encounter for screening for COVID-19: Secondary | ICD-10-CM

## 2022-02-03 DIAGNOSIS — Z743 Need for continuous supervision: Secondary | ICD-10-CM | POA: Diagnosis not present

## 2022-02-03 DIAGNOSIS — Z905 Acquired absence of kidney: Secondary | ICD-10-CM | POA: Diagnosis not present

## 2022-02-03 DIAGNOSIS — Z85528 Personal history of other malignant neoplasm of kidney: Secondary | ICD-10-CM

## 2022-02-03 DIAGNOSIS — J9621 Acute and chronic respiratory failure with hypoxia: Secondary | ICD-10-CM | POA: Diagnosis present

## 2022-02-03 DIAGNOSIS — Z85118 Personal history of other malignant neoplasm of bronchus and lung: Secondary | ICD-10-CM | POA: Diagnosis not present

## 2022-02-03 DIAGNOSIS — K580 Irritable bowel syndrome with diarrhea: Secondary | ICD-10-CM | POA: Diagnosis not present

## 2022-02-03 DIAGNOSIS — R042 Hemoptysis: Secondary | ICD-10-CM | POA: Diagnosis present

## 2022-02-03 DIAGNOSIS — E785 Hyperlipidemia, unspecified: Secondary | ICD-10-CM | POA: Diagnosis present

## 2022-02-03 DIAGNOSIS — J40 Bronchitis, not specified as acute or chronic: Secondary | ICD-10-CM | POA: Diagnosis not present

## 2022-02-03 HISTORY — DX: Pneumonia, unspecified organism: J18.9

## 2022-02-03 HISTORY — DX: Chronic obstructive pulmonary disease with (acute) exacerbation: J44.1

## 2022-02-03 LAB — COMPREHENSIVE METABOLIC PANEL
ALT: 16 U/L (ref 0–44)
AST: 16 U/L (ref 15–41)
Albumin: 3.9 g/dL (ref 3.5–5.0)
Alkaline Phosphatase: 63 U/L (ref 38–126)
Anion gap: 11 (ref 5–15)
BUN: 9 mg/dL (ref 8–23)
CO2: 20 mmol/L — ABNORMAL LOW (ref 22–32)
Calcium: 8.9 mg/dL (ref 8.9–10.3)
Chloride: 101 mmol/L (ref 98–111)
Creatinine, Ser: 1.07 mg/dL (ref 0.61–1.24)
GFR, Estimated: >60 mL/min (ref >60)
Glucose, Bld: 99 mg/dL (ref 70–99)
Potassium: 4.3 mmol/L (ref 3.5–5.1)
Sodium: 132 mmol/L — ABNORMAL LOW (ref 135–145)
Total Bilirubin: 0.6 mg/dL (ref 0.3–1.2)
Total Protein: 7.2 g/dL (ref 6.5–8.1)

## 2022-02-03 LAB — RESP PANEL BY RT-PCR (RSV, FLU A&B, COVID)  RVPGX2
Influenza A by PCR: NEGATIVE
Influenza B by PCR: NEGATIVE
Resp Syncytial Virus by PCR: NEGATIVE
SARS Coronavirus 2 by RT PCR: NEGATIVE

## 2022-02-03 LAB — CBC WITH DIFFERENTIAL/PLATELET
Abs Immature Granulocytes: 0.23 10*3/uL — ABNORMAL HIGH (ref 0.00–0.07)
Basophils Absolute: 0.1 10*3/uL (ref 0.0–0.1)
Basophils Relative: 1 %
Eosinophils Absolute: 0 10*3/uL (ref 0.0–0.5)
Eosinophils Relative: 0 %
HCT: 43.9 % (ref 39.0–52.0)
Hemoglobin: 14.4 g/dL (ref 13.0–17.0)
Immature Granulocytes: 3 %
Lymphocytes Relative: 5 %
Lymphs Abs: 0.3 10*3/uL — ABNORMAL LOW (ref 0.7–4.0)
MCH: 31.2 pg (ref 26.0–34.0)
MCHC: 32.8 g/dL (ref 30.0–36.0)
MCV: 95 fL (ref 80.0–100.0)
Monocytes Absolute: 0.2 10*3/uL (ref 0.1–1.0)
Monocytes Relative: 3 %
Neutro Abs: 6.2 10*3/uL (ref 1.7–7.7)
Neutrophils Relative %: 88 %
Platelets: 269 10*3/uL (ref 150–400)
RBC: 4.62 MIL/uL (ref 4.22–5.81)
RDW: 14.4 % (ref 11.5–15.5)
WBC: 7.1 10*3/uL (ref 4.0–10.5)
nRBC: 0 % (ref 0.0–0.2)

## 2022-02-03 LAB — BLOOD GAS, VENOUS
Acid-base deficit: 0.8 mmol/L (ref 0.0–2.0)
Bicarbonate: 23.5 mmol/L (ref 20.0–28.0)
O2 Saturation: 78.9 %
Patient temperature: 37
pCO2, Ven: 37 mmHg — ABNORMAL LOW (ref 44–60)
pH, Ven: 7.41 (ref 7.25–7.43)
pO2, Ven: 47 mmHg — ABNORMAL HIGH (ref 32–45)

## 2022-02-03 LAB — TROPONIN I (HIGH SENSITIVITY): Troponin I (High Sensitivity): 4 ng/L (ref <18)

## 2022-02-03 LAB — LACTIC ACID, PLASMA: Lactic Acid, Venous: 1.1 mmol/L (ref 0.5–1.9)

## 2022-02-03 MED ORDER — METHYLPREDNISOLONE SODIUM SUCC 125 MG IJ SOLR
125.0000 mg | Freq: Once | INTRAMUSCULAR | Status: AC
  Administered 2022-02-03: 125 mg via INTRAVENOUS
  Filled 2022-02-03: qty 2

## 2022-02-03 MED ORDER — SODIUM CHLORIDE 0.9 % IV SOLN: INTRAVENOUS | Status: AC

## 2022-02-03 MED ORDER — ACETAMINOPHEN 325 MG PO TABS
650.0000 mg | ORAL_TABLET | Freq: Four times a day (QID) | ORAL | Status: DC | PRN
  Administered 2022-02-06 – 2022-02-08 (×5): 650 mg via ORAL
  Filled 2022-02-03 (×5): qty 2

## 2022-02-03 MED ORDER — IPRATROPIUM-ALBUTEROL 0.5-2.5 (3) MG/3ML IN SOLN
3.0000 mL | RESPIRATORY_TRACT | Status: AC
  Administered 2022-02-03: 3 mL via RESPIRATORY_TRACT
  Filled 2022-02-03: qty 3

## 2022-02-03 MED ORDER — ROSUVASTATIN CALCIUM 20 MG PO TABS
20.0000 mg | ORAL_TABLET | Freq: Every day | ORAL | Status: DC
  Administered 2022-02-04 – 2022-02-08 (×5): 20 mg via ORAL
  Filled 2022-02-03 (×5): qty 1

## 2022-02-03 MED ORDER — ISOSORBIDE MONONITRATE ER 30 MG PO TB24
15.0000 mg | ORAL_TABLET | Freq: Every day | ORAL | Status: DC
  Administered 2022-02-05 – 2022-02-08 (×4): 15 mg via ORAL
  Filled 2022-02-03 (×6): qty 1

## 2022-02-03 MED ORDER — PREDNISONE 20 MG PO TABS
40.0000 mg | ORAL_TABLET | Freq: Every day | ORAL | Status: AC
  Administered 2022-02-05 – 2022-02-08 (×4): 40 mg via ORAL
  Filled 2022-02-03 (×4): qty 2

## 2022-02-03 MED ORDER — PANTOPRAZOLE SODIUM 40 MG PO TBEC
40.0000 mg | DELAYED_RELEASE_TABLET | Freq: Every day | ORAL | Status: DC
  Administered 2022-02-04 – 2022-02-07 (×4): 40 mg via ORAL
  Filled 2022-02-03 (×4): qty 1

## 2022-02-03 MED ORDER — SODIUM CHLORIDE 0.9 % IV SOLN
2.0000 g | INTRAVENOUS | Status: AC
  Administered 2022-02-03 – 2022-02-07 (×5): 2 g via INTRAVENOUS
  Filled 2022-02-03 (×7): qty 20

## 2022-02-03 MED ORDER — ALBUTEROL SULFATE (2.5 MG/3ML) 0.083% IN NEBU
5.0000 mg | INHALATION_SOLUTION | Freq: Once | RESPIRATORY_TRACT | Status: AC
  Administered 2022-02-03: 5 mg via RESPIRATORY_TRACT
  Filled 2022-02-03: qty 6

## 2022-02-03 MED ORDER — FENTANYL CITRATE PF 50 MCG/ML IJ SOSY
25.0000 ug | PREFILLED_SYRINGE | Freq: Once | INTRAMUSCULAR | Status: AC
  Administered 2022-02-03: 25 ug via INTRAVENOUS
  Filled 2022-02-03: qty 1

## 2022-02-03 MED ORDER — ACETAMINOPHEN 650 MG RE SUPP: 650.0000 mg | Freq: Four times a day (QID) | RECTAL | Status: DC | PRN

## 2022-02-03 MED ORDER — HYDROMORPHONE HCL 1 MG/ML IJ SOLN
0.5000 mg | INTRAMUSCULAR | Status: DC | PRN
  Administered 2022-02-04: 0.5 mg via INTRAVENOUS
  Filled 2022-02-03: qty 1

## 2022-02-03 MED ORDER — SODIUM CHLORIDE 0.9 % IV BOLUS
500.0000 mL | Freq: Once | INTRAVENOUS | Status: AC
  Administered 2022-02-03: 500 mL via INTRAVENOUS

## 2022-02-03 MED ORDER — ONDANSETRON HCL 4 MG/2ML IJ SOLN
4.0000 mg | Freq: Once | INTRAMUSCULAR | Status: AC
  Administered 2022-02-03: 4 mg via INTRAVENOUS
  Filled 2022-02-03: qty 2

## 2022-02-03 MED ORDER — GUAIFENESIN-DM 100-10 MG/5ML PO SYRP
5.0000 mL | ORAL_SOLUTION | ORAL | Status: DC | PRN
  Administered 2022-02-03 – 2022-02-04 (×3): 5 mL via ORAL
  Filled 2022-02-03 (×2): qty 10
  Filled 2022-02-03 (×2): qty 5

## 2022-02-03 MED ORDER — ONDANSETRON HCL 4 MG PO TABS
4.0000 mg | ORAL_TABLET | Freq: Four times a day (QID) | ORAL | Status: DC | PRN
  Administered 2022-02-07: 4 mg via ORAL
  Filled 2022-02-03: qty 1

## 2022-02-03 MED ORDER — AZITHROMYCIN 250 MG PO TABS
500.0000 mg | ORAL_TABLET | Freq: Every day | ORAL | Status: AC
  Administered 2022-02-04 – 2022-02-08 (×5): 500 mg via ORAL
  Filled 2022-02-03 (×5): qty 2

## 2022-02-03 MED ORDER — OXYCODONE HCL 5 MG PO TABS: 5.0000 mg | ORAL_TABLET | ORAL | Status: DC | PRN

## 2022-02-03 MED ORDER — SENNOSIDES-DOCUSATE SODIUM 8.6-50 MG PO TABS: 1.0000 | ORAL_TABLET | Freq: Every evening | ORAL | Status: DC | PRN

## 2022-02-03 MED ORDER — ALBUTEROL SULFATE (2.5 MG/3ML) 0.083% IN NEBU: 2.5000 mg | INHALATION_SOLUTION | RESPIRATORY_TRACT | Status: DC | PRN

## 2022-02-03 MED ORDER — OXYCODONE HCL 5 MG PO TABS
5.0000 mg | ORAL_TABLET | ORAL | Status: DC | PRN
  Administered 2022-02-03 – 2022-02-07 (×9): 5 mg via ORAL
  Filled 2022-02-03 (×9): qty 1

## 2022-02-03 MED ORDER — IPRATROPIUM BROMIDE 0.02 % IN SOLN
0.5000 mg | Freq: Once | RESPIRATORY_TRACT | Status: AC
  Administered 2022-02-03: 0.5 mg via RESPIRATORY_TRACT
  Filled 2022-02-03: qty 2.5

## 2022-02-03 MED ORDER — IPRATROPIUM-ALBUTEROL 0.5-2.5 (3) MG/3ML IN SOLN
3.0000 mL | Freq: Four times a day (QID) | RESPIRATORY_TRACT | Status: DC
  Administered 2022-02-04 (×4): 3 mL via RESPIRATORY_TRACT
  Filled 2022-02-03 (×4): qty 3

## 2022-02-03 MED ORDER — METHYLPREDNISOLONE SODIUM SUCC 125 MG IJ SOLR
125.0000 mg | Freq: Two times a day (BID) | INTRAMUSCULAR | Status: AC
  Administered 2022-02-04 (×2): 125 mg via INTRAVENOUS
  Filled 2022-02-03 (×2): qty 2

## 2022-02-03 MED ORDER — DULOXETINE HCL 30 MG PO CPEP
30.0000 mg | ORAL_CAPSULE | Freq: Every day | ORAL | Status: DC
  Administered 2022-02-04 – 2022-02-08 (×5): 30 mg via ORAL
  Filled 2022-02-03 (×5): qty 1

## 2022-02-03 MED ORDER — ONDANSETRON HCL 4 MG/2ML IJ SOLN
4.0000 mg | Freq: Four times a day (QID) | INTRAMUSCULAR | Status: DC | PRN
  Administered 2022-02-05 – 2022-02-08 (×6): 4 mg via INTRAVENOUS
  Filled 2022-02-03 (×5): qty 2

## 2022-02-03 MED ORDER — TAMSULOSIN HCL 0.4 MG PO CAPS
0.4000 mg | ORAL_CAPSULE | Freq: Every day | ORAL | Status: DC
  Administered 2022-02-05 – 2022-02-08 (×4): 0.4 mg via ORAL
  Filled 2022-02-03 (×5): qty 1

## 2022-02-03 NOTE — H&P (Signed)
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 trend procalcitonin, continue systemic steroids, schedule SAMA-SABA and use additional SABA as-needed   2. Hemoptysis  - Cough has been productive of blood-streaked sputum and small clots for ~1 wk  - No PE seen on CTA from 12/28,  but pneumonia noted  - Continue antibiotics and supportive care, monitor     3. Hyponatremia  - Serum sodium 132 in setting of hypovolemia  - Continue IVF hydration     DVT prophylaxis: Lovenox  Code Status: Full  Level of Care: Level of care: Med-Surg Family Communication: none present  Disposition Plan:  Patient is from: home  Anticipated d/c is to: Home  Anticipated d/c date is: 02/05/22  Patient currently: Pending improved respiratory status  Consults called: none Admission status: Observation     Vianne Bulls, MD [facility]  02/03/2022, 11:52 PM

## 2022-02-03 NOTE — ED Triage Notes (Signed)
Pt BIB EMS from home. Pt diagnosed with pneumonia 3 days ago at [facility]. EMS reports rhonchi in lower lobes bilaterally. Pt has lung and kidney cancer. Pt c/o pain from coughing.  142/92 78 HR 95% on 2L [place] O2- baseline

## 2022-02-03 NOTE — ED Provider Notes (Addendum)
  Physical Exam  BP 121/78 (BP Location: Left Arm)   Pulse 70   Temp 97.8 F (36.6 C) (Oral)   Resp 18   SpO2 94%     Procedures  Procedures  ED Course / MDM    Medical Decision Making Amount and/or Complexity of Data Reviewed Labs: ordered.  Risk Prescription drug management. Decision regarding hospitalization.   70M, recently treated for a pneumonia with outpatient ABX, feels poor, on prednisone and augmentin. Worsening bloody sputum, previously CTA negative 3d ago. Wheezing on exam. Treating COPD exacerbation. Plan for reassessment.   On repeat assessment, the patient felt about the same.  He has been coughing up bloody sputum with some passage of small-volume clots.  He had a CTA that was negative for PE 3 days ago.  I do not think repeat CT imaging is warranted at this time.  I do think he warrants admission for observation in the setting of COPD exacerbation and hemoptysis.  Hospitalist medicine consulted for admission, Dr. Myna Hidalgo accepting.      Regan Lemming, MD 02/03/22 2230    Regan Lemming, MD 02/03/22 2249

## 2022-02-03 NOTE — ED Provider Notes (Signed)
[facility] DEPT Provider Note   CSN: [id number] Arrival date & time: 02/03/22  1811     History  Chief Complaint  Patient presents with   Pneumonia    [name] is a 67 y.o. male history of COPD and lung cancer on 2 L nasal cannula, here presenting with cough and blood-tinged sputum.  Patient was seen at [facility] 3 days ago and had a CTA that did not show any PE.  It did show pneumonia and patient was sent home with Augmentin and a course of steroids.  Patient should at that time.  [name] states that initially [name] felt better but today [name] had more cough and shortness of breath.  [name] also has blood-tinged sputum at baseline and states that now it is getting progressively worse and it is now quarter size.  Denies any fevers.  The history is provided by the patient.       Home Medications Prior to Admission medications   Medication Sig Start Date End Date Taking? Authorizing Provider  acetaminophen (TYLENOL) 500 MG tablet Take 500 mg by mouth every 6 (six) hours as needed for mild pain or headache.     [provider]  albuterol (PROVENTIL HFA) 108 (90 Base) MCG/ACT inhaler INHALE 2 PUFFS BY MOUTH EVERY 6 HOURS AS NEEDED FOR WHEEZING OR SHORTNESS OF BREATH 11/27/21   Collene Gobble, MD  albuterol (PROVENTIL) (2.5 MG/3ML) 0.083% nebulizer solution Take 3 mLs (2.5 mg total) by nebulization every 6 (six) hours as needed for wheezing or shortness of breath. 11/27/21   Collene Gobble, MD  amoxicillin-clavulanate (AUGMENTIN) 875-125 MG tablet Take 1 tablet by mouth every 12 (twelve) hours. 01/30/22   Deno Etienne, DO  azithromycin (ZITHROMAX) 250 MG tablet Take 1 tablet (250 mg total) by mouth daily. Take first 2 tablets together, then 1 every day until finished. 01/30/22   Deno Etienne, DO  carboxymethylcellulose (REFRESH PLUS) 0.5 % SOLN Place 1 drop into both eyes 3 (three) times daily as needed (dry eyes).    [provider]  cefUROXime  (CEFTIN) 250 MG tablet 250 mg twice a day for 7 days (February, May, August, November) 03/01/21   Collene Gobble, MD  cefUROXime (CEFTIN) 250 MG tablet Take 1 tablet (250 mg total) by mouth 2 (two) times daily with a meal. 11/27/21   Byrum, Rose Fillers, MD  dicyclomine (BENTYL) 10 MG capsule Take 1 capsule (10 mg total) by mouth in the morning and at bedtime. 11/19/21   Mansouraty, Telford Nab., MD  doxycycline (VIBRA-TABS) 100 MG tablet 100 mg twice a day for 7 days (April, July, October, January) 11/27/21   Collene Gobble, MD  DULoxetine (CYMBALTA) 30 MG capsule Take 1 capsule (30 mg total) by mouth daily. 08/20/21   Katsadouros, Vasilios, MD  eszopiclone (LUNESTA) 1 MG TABS tablet Take 1 tablet (1 mg total) by mouth at bedtime as needed for sleep. Take immediately before bedtime 03/29/19   Sherrilyn Rist A, MD  fexofenadine (ALLEGRA ALLERGY) 180 MG tablet Take 1 tablet (180 mg total) by mouth daily. 12/13/20   Sanjuan Dame, MD  FLUoxetine (PROZAC) 10 MG capsule Take 10 mg by mouth daily as needed (Depression). 12/06/19   [provider]  fluticasone (FLONASE) 50 MCG/ACT nasal spray Place 2 sprays into both nostrils daily. 12/13/21   Lacinda Axon, MD  Fluticasone-Umeclidin-Vilant (TRELEGY ELLIPTA) 100-62.5-25 MCG/ACT AEPB Inhale 1 puff into the lungs daily. 11/27/21   Collene Gobble, MD  Fluticasone-Umeclidin-Vilant 200-62.5-25 MCG/ACT AEPB Inhale 1 puff into the lungs daily. 10/17/21   Collene Gobble, MD  Guaifenesin 200 MG/5ML LIQD Take 10 mLs (400 mg total) by mouth every 6 (six) hours as needed. 10/24/20   Martyn Ehrich, NP  hydrOXYzine (ATARAX) 25 MG tablet Take 1 tablet (25 mg total) by mouth every 6 (six) hours as needed for anxiety (or sleep). 03/26/21   Lacinda Axon, MD  isosorbide mononitrate (IMDUR) 30 MG 24 hr tablet Take 0.5 tablets (15 mg total) by mouth daily. 10/09/21   Lenna Sciara, NP  ketoconazole (NIZORAL) 2 % shampoo Apply 1 application  topically 2  (two) times a week. 08/14/20   [provider]  meloxicam (MOBIC) 7.5 MG tablet Take 7.5 mg by mouth daily as needed for pain. 01/06/20   [provider]  mesalamine (LIALDA) 1.2 g EC tablet Take 2 tablets by mouth at bedtime. 11/19/21   Mansouraty, Telford Nab., MD  methocarbamol (ROBAXIN) 500 MG tablet Take 500 mg by mouth daily. 08/03/20   [provider]  naproxen sodium (ALEVE) 220 MG tablet Take 220 mg by mouth daily as needed.    [provider]  nitroGLYCERIN (NITROSTAT) 0.4 MG SL tablet Place 1 tablet (0.4 mg total) under the tongue every 5 (five) minutes as needed for chest pain. 08/19/21 11/17/21  Lenna Sciara, NP  omeprazole (PRILOSEC) 40 MG capsule Take 1 capsule (40 mg total) by mouth daily. 01/06/22   Vladimir Crofts, PA-C  ondansetron (ZOFRAN) 4 MG tablet Take 1 tablet (4 mg total) by mouth every 8 (eight) hours as needed for nausea or vomiting. 01/06/22   Vladimir Crofts, PA-C  OXYGEN Inhale 2 L into the lungs at bedtime.    [provider]  predniSONE (DELTASONE) 10 MG tablet Take 1 tablet (10 mg total) by mouth daily with breakfast. 11/27/21   Byrum, Rose Fillers, MD  predniSONE (DELTASONE) 10 MG tablet Take 4 tabs by mouth for 3 days, then 3 for 3 days, 2 for 3 days, 1 for 3 days and stop 01/31/22   Maryjane Hurter, MD  pregabalin (LYRICA) 100 MG capsule Take 100 mg by mouth daily. 04/29/19   [provider]  promethazine (PHENERGAN) 12.5 MG tablet Take 1 tablet (12.5 mg total) by mouth every 4 (four) hours as needed for nausea or vomiting. 06/01/19   Debbrah Alar, PA-C  rifaximin (XIFAXAN) 550 MG TABS tablet Take 1 tablet (550 mg total) by mouth 3 (three) times daily. 11/19/21   Mansouraty, Telford Nab., MD  rosuvastatin (CRESTOR) 20 MG tablet Take 1 tablet (20 mg total) by mouth daily. 01/01/22 01/01/23  Lacinda Axon, MD  Simethicone 125 MG TABS Take 1 tablet (125 mg total) by mouth 3 (three) times daily as needed. 06/06/20    Collene Gobble, MD  sucralfate (CARAFATE) 1 g tablet Take 1 tablet (1 g total) by mouth 4 (four) times daily -  with meals and at bedtime. 01/06/22 02/05/22  Vladimir Crofts, PA-C  tamsulosin (FLOMAX) 0.4 MG CAPS capsule Take 0.4 mg by mouth daily. 01/20/19   [provider]  traMADol (ULTRAM) 50 MG tablet Take 50 mg by mouth 2 (two) times daily as needed. 11/14/21   [provider]      Allergies    Patient has no known allergies.    Review of Systems   Review of Systems  Respiratory:  Positive for cough and shortness of breath.  All other systems reviewed and are negative.   Physical Exam Updated Vital Signs BP 121/78 (BP Location: Left Arm)   Pulse 70   Temp 97.8 F (36.6 C) (Oral)   Resp 18   SpO2 94%  Physical Exam Vitals and nursing note reviewed.  Constitutional:      Appearance: Normal appearance.     Comments: Chronically ill-appearing, tachypneic  HENT:     Head: Normocephalic.     Nose: Nose normal.     Mouth/Throat:     Mouth: Mucous membranes are moist.  Eyes:     Extraocular Movements: Extraocular movements intact.     Pupils: Pupils are equal, round, and reactive to light.  Cardiovascular:     Rate and Rhythm: Normal rate and regular rhythm.     Pulses: Normal pulses.  Pulmonary:     Comments: Diffuse wheezing and no retractions Musculoskeletal:        General: Normal range of motion.     Cervical back: Normal range of motion and neck supple.  Skin:    General: Skin is warm.     Capillary Refill: Capillary refill takes less than 2 seconds.  Neurological:     General: No focal deficit present.     Mental Status: [name] is alert.  Psychiatric:        Mood and Affect: Mood normal.     ED Results / Procedures / Treatments   Labs (all labs ordered are listed, but only abnormal results are displayed) Labs Reviewed  COMPREHENSIVE METABOLIC PANEL - Abnormal; Notable for the following components:      Result Value   Sodium 132 (*)     CO2 20 (*)    All other components within normal limits  CBC WITH DIFFERENTIAL/PLATELET - Abnormal; Notable for the following components:   Lymphs Abs 0.3 (*)    Abs Immature Granulocytes 0.23 (*)    All other components within normal limits  CULTURE, BLOOD (ROUTINE X 2)  CULTURE, BLOOD (ROUTINE X 2)  RESP PANEL BY RT-PCR (RSV, FLU A&B, COVID)  RVPGX2  LACTIC ACID, PLASMA  BLOOD GAS, VENOUS  TROPONIN I (HIGH SENSITIVITY)    EKG None  Radiology DG Chest 2 View  Result Date: 02/03/2022 CLINICAL DATA:  Pneumonia with worsening symptoms. EXAM: CHEST - 2 VIEW COMPARISON:  01/30/2022. FINDINGS: The heart size and mediastinal contours are stable. There is atherosclerotic calcification. Stable emphysematous changes and scarring are noted bilaterally. Minimal residual airspace opacities are present at the lung bases. No consolidation, effusion, or pneumothorax. No acute osseous abnormality. IMPRESSION: 1. Minimal residual airspace opacities at the lung bases. 2. Emphysema. Electronically Signed   By: Brett Fairy M.D.   On: 02/03/2022 20:00    Procedures Procedures    Medications Ordered in ED Medications  ondansetron (ZOFRAN) injection 4 mg (has no administration in time range)  sodium chloride 0.9 % bolus 500 mL (has no administration in time range)  methylPREDNISolone sodium succinate (SOLU-MEDROL) 125 mg/2 mL injection 125 mg (has no administration in time range)  albuterol (PROVENTIL) (2.5 MG/3ML) 0.083% nebulizer solution 5 mg (has no administration in time range)  ipratropium (ATROVENT) nebulizer solution 0.5 mg (has no administration in time range)  fentaNYL (SUBLIMAZE) injection 25 mcg (has no administration in time range)    ED Course/ Medical Decision Making/ A&P                           Medical Decision Making  KAYZEN KENDZIERSKI is a 67 y.o. male here presenting with cough and shortness of breath.  Patient was recently diagnosed with pneumonia.  Patient is already on  Augmentin and prednisone.  [name] is on 2 L nasal cannula at baseline.  Patient has diffuse wheezing on exam.  Concern for possible recurrent pneumonia versus COPD exacerbation.  Plan to get CBC and CMP and chest x-ray and COVID and flu test.  Will give Solu-Medrol and albuterol and reassess.  8:36 PM I have reviewed patient's labs and CBC is unremarkable and lactate is normal.  Chest x-ray showed minimal residual opacities consistent with recent pneumonia.  Signed out to Dr. Armandina Gemma pending reassessment after meds.  Likely admission if patient not feeling better.   Amount and/or Complexity of Data Reviewed Labs: ordered.  Risk Prescription drug management.    Final Clinical Impression(s) / ED Diagnoses Final diagnoses:  None    Rx / DC Orders ED Discharge Orders     None         Drenda Freeze, MD 02/03/22 2037

## 2022-02-03 NOTE — ED Provider Triage Note (Signed)
Emergency Medicine Provider Triage Evaluation Note  [name] , a 67 y.o. male  was evaluated in triage.  Pt complains of generally feeling unwell.  He states that he was seen at [facility] emergency department and told he had pneumonia.  He states that he was placed on antibiotics and prednisone but his symptoms or not improving.  He states that he is coughing up blood-tinged sputum and feels short of breath as well as having some chest pain with coughing.  Overall feels weak and nauseated.  He is on 2 L O2 at baseline and has not had to increase this. Review of Systems  Positive: See above Negative:   Physical Exam  BP (!) 143/90 (BP Location: Right Arm)   Pulse (!) 57   Temp (!) 97.4 F (36.3 C) (Oral)   Resp 14   Ht 5\' 9"  (1.753 m)   Wt 72.6 kg   SpO2 96%   BMI 23.63 kg/m  Gen:   Awake, no distress   Resp:  Normal effort  MSK:   Moves extremities without difficulty  Other:    Medical Decision Making  Medically screening exam initiated at 2:58 PM.  Appropriate orders placed.  [name] was informed that the remainder of the evaluation will be completed by another provider, this initial triage assessment does not replace that evaluation, and the importance of remaining in the ED until their evaluation is complete.     Mickie Hillier, PA-C 02/04/22 1458

## 2022-02-04 DIAGNOSIS — J439 Emphysema, unspecified: Secondary | ICD-10-CM | POA: Diagnosis not present

## 2022-02-04 DIAGNOSIS — I2510 Atherosclerotic heart disease of native coronary artery without angina pectoris: Secondary | ICD-10-CM | POA: Diagnosis present

## 2022-02-04 DIAGNOSIS — J9611 Chronic respiratory failure with hypoxia: Secondary | ICD-10-CM | POA: Diagnosis not present

## 2022-02-04 DIAGNOSIS — E785 Hyperlipidemia, unspecified: Secondary | ICD-10-CM | POA: Diagnosis present

## 2022-02-04 DIAGNOSIS — E871 Hypo-osmolality and hyponatremia: Secondary | ICD-10-CM | POA: Diagnosis not present

## 2022-02-04 DIAGNOSIS — Z981 Arthrodesis status: Secondary | ICD-10-CM | POA: Diagnosis not present

## 2022-02-04 DIAGNOSIS — I700 Atherosclerosis of aorta: Secondary | ICD-10-CM | POA: Diagnosis not present

## 2022-02-04 DIAGNOSIS — R0602 Shortness of breath: Secondary | ICD-10-CM | POA: Diagnosis not present

## 2022-02-04 DIAGNOSIS — K219 Gastro-esophageal reflux disease without esophagitis: Secondary | ICD-10-CM | POA: Diagnosis present

## 2022-02-04 DIAGNOSIS — R042 Hemoptysis: Secondary | ICD-10-CM | POA: Diagnosis not present

## 2022-02-04 DIAGNOSIS — J189 Pneumonia, unspecified organism: Secondary | ICD-10-CM | POA: Diagnosis present

## 2022-02-04 DIAGNOSIS — Z85118 Personal history of other malignant neoplasm of bronchus and lung: Secondary | ICD-10-CM | POA: Diagnosis not present

## 2022-02-04 DIAGNOSIS — Z905 Acquired absence of kidney: Secondary | ICD-10-CM | POA: Diagnosis not present

## 2022-02-04 DIAGNOSIS — J441 Chronic obstructive pulmonary disease with (acute) exacerbation: Secondary | ICD-10-CM | POA: Diagnosis not present

## 2022-02-04 DIAGNOSIS — E861 Hypovolemia: Secondary | ICD-10-CM | POA: Diagnosis present

## 2022-02-04 DIAGNOSIS — Z8701 Personal history of pneumonia (recurrent): Secondary | ICD-10-CM | POA: Diagnosis not present

## 2022-02-04 DIAGNOSIS — Z9981 Dependence on supplemental oxygen: Secondary | ICD-10-CM | POA: Diagnosis not present

## 2022-02-04 DIAGNOSIS — J40 Bronchitis, not specified as acute or chronic: Secondary | ICD-10-CM | POA: Diagnosis present

## 2022-02-04 DIAGNOSIS — Z79899 Other long term (current) drug therapy: Secondary | ICD-10-CM | POA: Diagnosis not present

## 2022-02-04 DIAGNOSIS — J440 Chronic obstructive pulmonary disease with acute lower respiratory infection: Secondary | ICD-10-CM | POA: Diagnosis present

## 2022-02-04 DIAGNOSIS — J9621 Acute and chronic respiratory failure with hypoxia: Secondary | ICD-10-CM | POA: Diagnosis present

## 2022-02-04 DIAGNOSIS — Z85528 Personal history of other malignant neoplasm of kidney: Secondary | ICD-10-CM | POA: Diagnosis not present

## 2022-02-04 DIAGNOSIS — D649 Anemia, unspecified: Secondary | ICD-10-CM | POA: Diagnosis present

## 2022-02-04 DIAGNOSIS — Z87891 Personal history of nicotine dependence: Secondary | ICD-10-CM | POA: Diagnosis not present

## 2022-02-04 DIAGNOSIS — I252 Old myocardial infarction: Secondary | ICD-10-CM | POA: Diagnosis not present

## 2022-02-04 DIAGNOSIS — K580 Irritable bowel syndrome with diarrhea: Secondary | ICD-10-CM | POA: Diagnosis present

## 2022-02-04 DIAGNOSIS — Z1152 Encounter for screening for COVID-19: Secondary | ICD-10-CM | POA: Diagnosis not present

## 2022-02-04 DIAGNOSIS — Z7951 Long term (current) use of inhaled steroids: Secondary | ICD-10-CM | POA: Diagnosis not present

## 2022-02-04 LAB — MAGNESIUM: Magnesium: 1.8 mg/dL (ref 1.7–2.4)

## 2022-02-04 LAB — BASIC METABOLIC PANEL
Anion gap: 7 (ref 5–15)
BUN: 9 mg/dL (ref 8–23)
CO2: 21 mmol/L — ABNORMAL LOW (ref 22–32)
Calcium: 8.3 mg/dL — ABNORMAL LOW (ref 8.9–10.3)
Chloride: 107 mmol/L (ref 98–111)
Creatinine, Ser: 0.99 mg/dL (ref 0.61–1.24)
GFR, Estimated: >60 mL/min (ref >60)
Glucose, Bld: 177 mg/dL — ABNORMAL HIGH (ref 70–99)
Potassium: 4 mmol/L (ref 3.5–5.1)
Sodium: 135 mmol/L (ref 135–145)

## 2022-02-04 LAB — CBC
HCT: 37.7 % — ABNORMAL LOW (ref 39.0–52.0)
Hemoglobin: 12.4 g/dL — ABNORMAL LOW (ref 13.0–17.0)
MCH: 31.3 pg (ref 26.0–34.0)
MCHC: 32.9 g/dL (ref 30.0–36.0)
MCV: 95.2 fL (ref 80.0–100.0)
Platelets: 251 10*3/uL (ref 150–400)
RBC: 3.96 MIL/uL — ABNORMAL LOW (ref 4.22–5.81)
RDW: 14.1 % (ref 11.5–15.5)
WBC: 5.5 10*3/uL (ref 4.0–10.5)
nRBC: 0 % (ref 0.0–0.2)

## 2022-02-04 LAB — PROCALCITONIN
Procalcitonin: 0.17 ng/mL
Procalcitonin: <0.1 ng/mL

## 2022-02-04 LAB — STREP PNEUMONIAE URINARY ANTIGEN: Strep Pneumo Urinary Antigen: NEGATIVE

## 2022-02-04 MED ORDER — SODIUM CHLORIDE 0.9 % IV SOLN: INTRAVENOUS | Status: AC

## 2022-02-04 MED ORDER — HYDROXYZINE HCL 10 MG PO TABS
10.0000 mg | ORAL_TABLET | Freq: Once | ORAL | Status: AC | PRN
  Administered 2022-02-05: 10 mg via ORAL
  Filled 2022-02-04: qty 1

## 2022-02-04 MED ORDER — GUAIFENESIN ER 600 MG PO TB12
1200.0000 mg | ORAL_TABLET | Freq: Two times a day (BID) | ORAL | Status: DC
  Administered 2022-02-04 – 2022-02-08 (×8): 1200 mg via ORAL
  Filled 2022-02-04 (×10): qty 2

## 2022-02-04 MED ORDER — TRANEXAMIC ACID FOR INHALATION
500.0000 mg | Freq: Once | RESPIRATORY_TRACT | Status: AC
  Administered 2022-02-04: 500 mg via RESPIRATORY_TRACT
  Filled 2022-02-04: qty 10

## 2022-02-04 MED ORDER — IPRATROPIUM-ALBUTEROL 0.5-2.5 (3) MG/3ML IN SOLN
RESPIRATORY_TRACT | Status: AC
  Administered 2022-02-04: 3 mL via RESPIRATORY_TRACT
  Filled 2022-02-04: qty 6

## 2022-02-04 MED ORDER — IPRATROPIUM-ALBUTEROL 0.5-2.5 (3) MG/3ML IN SOLN
3.0000 mL | Freq: Three times a day (TID) | RESPIRATORY_TRACT | Status: DC
  Administered 2022-02-05 – 2022-02-06 (×4): 3 mL via RESPIRATORY_TRACT
  Filled 2022-02-04 (×6): qty 3
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
## 2022-02-04 NOTE — ED Notes (Signed)
Cardiac Assessment Handoff:  Cardiac Rhythm: Normal sinus rhythm Lab Results  Component Value Date   TROPONINI <0.01 11/09/2018   Lab Results  Component Value Date   DDIMER 0.59 (H) 06/01/2017   Does the Patient currently have chest pain? No

## 2022-02-04 NOTE — Progress Notes (Signed)
PROGRESS NOTE    [name]  JOI:[id number] DOB: [birth year] DOA: 02/03/2022 PCP: Terry Axon, MD   Brief Narrative:  HPI per Dr. Mitzi Norman on 02/03/22 [name] is a pleasant 67 y.o. male with medical history significant for adenocarcinoma of the lung status postresection, renal cell carcinoma status post partial left nephrectomy, and COPD with chronic hypoxic respiratory failure, now presenting to the emergency department with cough, shortness of breath, fatigue, and bloody sputum.   Patient reports that he began to feel poorly the night of 01/29/2022 and the following day was experiencing increased shortness of breath, general malaise, chills, and blood-streaked sputum.  He was seen in the emergency department on 01/30/2022 where he had CT chest that was negative for PE but concerning for pneumonia.  He was started on Augmentin and azithromycin his pulmonologist called in a prednisone taper the following day.  Despite this, he reports that his condition has not improved at all and his hemoptysis has increased.   ED Course: Upon arrival to the ED, patient is found to be afebrile and saturating well on his usual 2 L/min of supplemental oxygen with normal heart rate and stable blood pressure.  Chest x-ray demonstrates emphysematous changes and minimal residual airspace opacity at the bases.  COVID, influenza, and RSV PCR is negative.  Labs notable for mild hyponatremia.   Blood cultures were collected in the ED and the patient was given 125 mg IV Solu-Medrol, 500 mL of saline, fentanyl, and multiple neb treatments.  **Interim History  Patient continues to cough and is bringing up bloody sputum.  Incidentally noted better but not very much significantly.  Continues to have rhonchi and will adjust his breathing treatments.  Assessment and Plan:  COPD with acute exacerbation with Associated pneumonia Acute on chronic hypoxic respiratory failure  -Continuous Pulse Oximetry and  Maintain O2 Saturations >90% -Continue Supplemental O2 via [place] and Wean O2 as Tolerated -Will need an Ambulatory Home O2 Screen prior to D/C  SpO2: 96 % O2 Flow Rate (L/min): 2 L/min -Failed to improve with outpatient antibiotic and prednisone  -Treated with 125 mg IV Solu-Medrol and multiple neb treatments in ED  -Culture sputum, check strep pneumo and legionella antigens,  -Was getting IVF hydration with NS at 100 mL/hr and will change to 75 mL/hr x1 day -Started Rocephin and azithromycin and will continue -Trend Procalcitonin and went from 0.17 -> <0.10 -Continue systemic steroids today and change to po tom -Schedule SAMA-SABA and use additional SABA as-needed  with Nebs Duonebs -C/w Flutter Valve, Incentive Spirometry, and Guaifenesin 1200 mg po BID  -May need to repeat imaging -C/w Antitussives  -Check MRSA, Respiratory Virus Panel, and Sputum Cx per Pulmonary reccs -Will discuss with Pulmonary for formal consultation    Hemoptysis  -Cough has been productive of blood-streaked sputum and small clots for ~1 wk but now states is progressively getting worse and it is now quarter size -No PE seen on CTA from 12/28, but pneumonia noted  -Continue antibiotics and supportive care -Continue to monitor closely and if necessary will need to call pulmonary -Recently Seen in the ED and was told by his Pulmonologist to come back to the ED if worsened    Hyponatremia  - Serum sodium 132 in setting of hypovolemia  -Na+ Trend: Recent Labs  Lab 01/06/22 1458 01/30/22 1530 02/03/22 1918 02/04/22 0418  NA 137 135 132* 135  -Continue IVF hydration and continue to Monitor and Trend -Repeat CMP in the AM  Metabolic Acidoisis -Mild. Patient's CO2 is now 21, AG is 7, Chloride Level was 107 -C/w IVF hydration as above -Continue to Monitor and Trend and Repeat CMP in the AM   Normocytic Anemia -Hgb/Hct Trend: Recent Labs  Lab 01/06/22 1458 01/30/22 1530 02/03/22 1918 02/04/22 0418  HGB  15.0 14.7 14.4 12.4*  HCT 45.1 44.3 43.9 37.7*  MCV 93.4 95.1 95.0 95.2  -Check Anemia Panel in the AM  -Continue to Monitor for S/Sx of Bleeding; No overt bleeding noted -Repeat CBC in the AM  Hx of Adenocarcinoma of the Lung s/p Resection Hx of Renal Cell Carcinoma s/p Partial Left Nephrectomy -Follow up with outpatient specialists   GERD/GI prophylaxis -Continue with Pantoprazole 40 mg p.o. daily  Hyperlipidemia -Continue Rosuvastatin 20 g p.o. daily  DVT prophylaxis: SCDs Start: 02/03/22 2256    Code Status: Full Code Family Communication: No family present at bedside   Disposition Plan:  Level of care: Med-Surg Status is: Inpatient Remains inpatient appropriate because: Needs further clinical improvement and if not improving will need Pulmonary Consultation   Consultants:  Pulmonary   Procedures:  As delineated as above  Antimicrobials:  Anti-infectives (From admission, onward)    Start     Dose/Rate Route Frequency Ordered Stop   02/04/22 1000  azithromycin (ZITHROMAX) tablet 500 mg        500 mg Oral Daily 02/03/22 2258 02/09/22 0959   02/03/22 2300  cefTRIAXone (ROCEPHIN) 2 g in sodium chloride 0.9 % 100 mL IVPB        2 g 200 mL/hr over 30 Minutes Intravenous Every 24 hours 02/03/22 2258 02/08/22 2259        Subjective: Seen And examined at bedside and he is still having productive cough with blood-tinged sputum and states that he is feeling a little bit better.  Continues to have a cough and shortness of breath.  No nausea or vomiting.  Objective: Vitals:   02/04/22 0701 02/04/22 0729 02/04/22 1100 02/04/22 1345  BP:   (!) 143/90   Pulse:   (!) 57   Resp:   14   Temp: (!) 97.2 F (36.2 C)  (!) 97.4 F (36.3 C)   TempSrc: Oral  Oral   SpO2:  100% 94% 96%  Weight:      Height:       No intake or output data in the 24 hours ending 02/04/22 1531 Filed Weights   02/03/22 2036  Weight: 72.6 kg   Examination: Physical Exam:  Constitutional:  WN/WD AAM in NAD appears in NAD Respiratory: Diminished to auscultation bilaterally with coarse breath sounds with rhonchi, no wheezing, rales, or crackles. Normal respiratory effort and patient is not tachypenic. No accessory muscle use. Unlabored breathing but wearing supplemental O2 via [place] Cardiovascular: RRR, no murmurs / rubs / gallops. S1 and S2 auscultated. No extremity edema.  Abdomen: Soft, non-tender, non-distended. Bowel sounds positive.  GU: Deferred. Musculoskeletal: No clubbing / cyanosis of digits/nails. No joint deformity upper and lower extremities.  Skin: No rashes, lesions, ulcers on a limited skin evaluation. No induration; Warm and dry.  Neurologic: CN 2-12 grossly intact with no focal deficits. Romberg sign and cerebellar reflexes not assessed.  Psychiatric: Normal judgment and insight. Alert and oriented x 3. Normal mood and appropriate affect.   Data Reviewed: I have personally reviewed following labs and imaging studies  CBC: Recent Labs  Lab 01/30/22 1530 02/03/22 1918 02/04/22 0418  WBC 25.6* 7.1 5.5  NEUTROABS  --  6.2  --  HGB 14.7 14.4 12.4*  HCT 44.3 43.9 37.7*  MCV 95.1 95.0 95.2  PLT 281 269 621   Basic Metabolic Panel: Recent Labs  Lab 01/30/22 1530 02/03/22 1918 02/04/22 0418  NA 135 132* 135  K 4.2 4.3 4.0  CL 100 101 107  CO2 23 20* 21*  GLUCOSE 83 99 177*  BUN 16 9 9   CREATININE 1.34* 1.07 0.99  CALCIUM 9.7 8.9 8.3*  MG  --   --  1.8   GFR: Estimated Creatinine Clearance: 73.4 mL/min (by C-G formula based on SCr of 0.99 mg/dL). Liver Function Tests: Recent Labs  Lab 02/03/22 1918  AST 16  ALT 16  ALKPHOS 63  BILITOT 0.6  PROT 7.2  ALBUMIN 3.9   No results for input(s): "LIPASE", "AMYLASE" in the last 168 hours. No results for input(s): "AMMONIA" in the last 168 hours. Coagulation Profile: No results for input(s): "INR", "PROTIME" in the last 168 hours. Cardiac Enzymes: No results for input(s): "CKTOTAL", "CKMB",  "CKMBINDEX", "TROPONINI" in the last 168 hours. BNP (last 3 results) No results for input(s): "PROBNP" in the last 8760 hours. HbA1C: No results for input(s): "HGBA1C" in the last 72 hours. CBG: No results for input(s): "GLUCAP" in the last 168 hours. Lipid Profile: No results for input(s): "CHOL", "HDL", "LDLCALC", "TRIG", "CHOLHDL", "LDLDIRECT" in the last 72 hours. Thyroid Function Tests: No results for input(s): "TSH", "T4TOTAL", "FREET4", "T3FREE", "THYROIDAB" in the last 72 hours. Anemia Panel: No results for input(s): "VITAMINB12", "FOLATE", "FERRITIN", "TIBC", "IRON", "RETICCTPCT" in the last 72 hours. Sepsis Labs: Recent Labs  Lab 02/03/22 1918 02/03/22 1928 02/04/22 0418  PROCALCITON  --  0.17 <0.10  LATICACIDVEN 1.1  --   --     Recent Results (from the past 240 hour(s))  Resp panel by RT-PCR (RSV, Flu A&B, Covid) Anterior Nasal Swab     Status: None   Collection Time: 01/30/22  3:30 PM   Specimen: Anterior Nasal Swab  Result Value Ref Range Status   SARS Coronavirus 2 by RT PCR NEGATIVE NEGATIVE Final    Comment: (NOTE) SARS-CoV-2 target nucleic acids are NOT DETECTED.  The SARS-CoV-2 RNA is generally detectable in upper respiratory specimens during the acute phase of infection. The lowest concentration of SARS-CoV-2 viral copies this assay can detect is 138 copies/mL. A negative result does not preclude SARS-Cov-2 infection and should not be used as the sole basis for treatment or other patient management decisions. A negative result may occur with  improper specimen collection/handling, submission of specimen other than nasopharyngeal swab, presence of viral mutation(s) within the areas targeted by this assay, and inadequate number of viral copies(<138 copies/mL). A negative result must be combined with clinical observations, patient history, and epidemiological information. The expected result is Negative.  Fact Sheet for Patients:   EntrepreneurPulse.com.au  Fact Sheet for Healthcare Providers:  IncredibleEmployment.be  This test is no t yet approved or cleared by the [place] FDA and  has been authorized for detection and/or diagnosis of SARS-CoV-2 by FDA under an Emergency Use Authorization (EUA). This EUA will remain  in effect (meaning this test can be used) for the duration of the COVID-19 declaration under Section 564(b)(1) of the Act, 21 U.S.C.section 360bbb-3(b)(1), unless the authorization is terminated  or revoked sooner.       Influenza A by PCR NEGATIVE NEGATIVE Final   Influenza B by PCR NEGATIVE NEGATIVE Final    Comment: (NOTE) The Xpert Xpress SARS-CoV-2/FLU/RSV plus assay is intended as an aid  in the diagnosis of influenza from Nasopharyngeal swab specimens and should not be used as a sole basis for treatment. Nasal washings and aspirates are unacceptable for Xpert Xpress SARS-CoV-2/FLU/RSV testing.  Fact Sheet for Patients: EntrepreneurPulse.com.au  Fact Sheet for Healthcare Providers: IncredibleEmployment.be  This test is not yet approved or cleared by the [place] FDA and has been authorized for detection and/or diagnosis of SARS-CoV-2 by FDA under an Emergency Use Authorization (EUA). This EUA will remain in effect (meaning this test can be used) for the duration of the COVID-19 declaration under Section 564(b)(1) of the Act, 21 U.S.C. section 360bbb-3(b)(1), unless the authorization is terminated or revoked.     Resp Syncytial Virus by PCR NEGATIVE NEGATIVE Final    Comment: (NOTE) Fact Sheet for Patients: EntrepreneurPulse.com.au  Fact Sheet for Healthcare Providers: IncredibleEmployment.be  This test is not yet approved or cleared by the [place] FDA and has been authorized for detection and/or diagnosis of SARS-CoV-2 by FDA under an Emergency Use  Authorization (EUA). This EUA will remain in effect (meaning this test can be used) for the duration of the COVID-19 declaration under Section 564(b)(1) of the Act, 21 U.S.C. section 360bbb-3(b)(1), unless the authorization is terminated or revoked.  Performed at [facility], [address], [place], [place] [address]   Culture, blood (routine x 2)     Status: None (Preliminary result)   Collection Time: 02/03/22  7:18 PM   Specimen: BLOOD LEFT ARM  Result Value Ref Range Status   Specimen Description   Final    BLOOD LEFT ARM Performed at [facility] [address], [place], [place] [address]    Special Requests   Final    BOTTLES DRAWN AEROBIC AND ANAEROBIC Blood Culture adequate volume Performed at [facility] [address] [place], [place] [address]    Culture   Final    NO GROWTH < 12 HOURS Performed at [facility] [address], [place], [place] [address]    Report Status PENDING  Incomplete  Culture, blood (routine x 2)     Status: None (Preliminary result)   Collection Time: 02/03/22  7:18 PM   Specimen: BLOOD LEFT ARM  Result Value Ref Range Status   Specimen Description   Final    BLOOD LEFT ARM Performed at [facility], [place] [address], [place], [place] [address]    Special Requests   Final    BOTTLES DRAWN AEROBIC AND ANAEROBIC Blood Culture adequate volume Performed at [facility] [address] [place], [place] [address]    Culture   Final    NO GROWTH < 12 HOURS Performed at [facility] [address] [place], [place] [address]    Report Status PENDING  Incomplete  Resp panel by RT-PCR (RSV, Flu A&B, Covid) Anterior Nasal Swab     Status: None   Collection Time: 02/03/22  8:49 PM   Specimen: Anterior Nasal Swab  Result Value Ref Range Status   SARS Coronavirus 2 by RT PCR NEGATIVE NEGATIVE Final    Comment: (NOTE) SARS-CoV-2 target nucleic acids are NOT  DETECTED.  The SARS-CoV-2 RNA is generally detectable in upper respiratory specimens during the acute phase of infection. The lowest concentration of SARS-CoV-2 viral copies this assay can detect is 138 copies/mL. A negative result does not preclude SARS-Cov-2 infection and should not be used as the sole basis for treatment or other patient management decisions. A  negative result may occur with  improper specimen collection/handling, submission of specimen other than nasopharyngeal swab, presence of viral mutation(s) within the areas targeted by this assay, and inadequate number of viral copies(<138 copies/mL). A negative result must be combined with clinical observations, patient history, and epidemiological information. The expected result is Negative.  Fact Sheet for Patients:  EntrepreneurPulse.com.au  Fact Sheet for Healthcare Providers:  IncredibleEmployment.be  This test is no t yet approved or cleared by the [place] FDA and  has been authorized for detection and/or diagnosis of SARS-CoV-2 by FDA under an Emergency Use Authorization (EUA). This EUA will remain  in effect (meaning this test can be used) for the duration of the COVID-19 declaration under Section 564(b)(1) of the Act, 21 U.S.C.section 360bbb-3(b)(1), unless the authorization is terminated  or revoked sooner.       Influenza A by PCR NEGATIVE NEGATIVE Final   Influenza B by PCR NEGATIVE NEGATIVE Final    Comment: (NOTE) The Xpert Xpress SARS-CoV-2/FLU/RSV plus assay is intended as an aid in the diagnosis of influenza from Nasopharyngeal swab specimens and should not be used as a sole basis for treatment. Nasal washings and aspirates are unacceptable for Xpert Xpress SARS-CoV-2/FLU/RSV testing.  Fact Sheet for Patients: EntrepreneurPulse.com.au  Fact Sheet for Healthcare Providers: IncredibleEmployment.be  This test is not yet  approved or cleared by the [place] FDA and has been authorized for detection and/or diagnosis of SARS-CoV-2 by FDA under an Emergency Use Authorization (EUA). This EUA will remain in effect (meaning this test can be used) for the duration of the COVID-19 declaration under Section 564(b)(1) of the Act, 21 U.S.C. section 360bbb-3(b)(1), unless the authorization is terminated or revoked.     Resp Syncytial Virus by PCR NEGATIVE NEGATIVE Final    Comment: (NOTE) Fact Sheet for Patients: EntrepreneurPulse.com.au  Fact Sheet for Healthcare Providers: IncredibleEmployment.be  This test is not yet approved or cleared by the [place] FDA and has been authorized for detection and/or diagnosis of SARS-CoV-2 by FDA under an Emergency Use Authorization (EUA). This EUA will remain in effect (meaning this test can be used) for the duration of the COVID-19 declaration under Section 564(b)(1) of the Act, 21 U.S.C. section 360bbb-3(b)(1), unless the authorization is terminated or revoked.  Performed at [facility], [place] [address] [place], [place] [address]     Radiology Studies: DG Chest 2 View  Result Date: 02/03/2022 CLINICAL DATA:  Pneumonia with worsening symptoms. EXAM: CHEST - 2 VIEW COMPARISON:  01/30/2022. FINDINGS: The heart size and mediastinal contours are stable. There is atherosclerotic calcification. Stable emphysematous changes and scarring are noted bilaterally. Minimal residual airspace opacities are present at the lung bases. No consolidation, effusion, or pneumothorax. No acute osseous abnormality. IMPRESSION: 1. Minimal residual airspace opacities at the lung bases. 2. Emphysema. Electronically Signed   By: Brett Fairy M.D.   On: 02/03/2022 20:00    Scheduled Meds:  azithromycin  500 mg Oral Daily   DULoxetine  30 mg Oral Daily   ipratropium-albuterol  3 mL Nebulization Q6H   isosorbide mononitrate  15 mg Oral  Daily   methylPREDNISolone (SOLU-MEDROL) injection  125 mg Intravenous Q12H   Followed by   Derrill Memo ON 02/05/2022] predniSONE  40 mg Oral Q breakfast   pantoprazole  40 mg Oral Daily   rosuvastatin  20 mg Oral Daily   tamsulosin  0.4 mg Oral Daily   Continuous Infusions:  cefTRIAXone (ROCEPHIN)  IV Stopped (02/04/22 0035)  LOS: 0 days   Raiford Noble, DO [facility] Available via Epic secure chat 7am-7pm After these hours, please refer to coverage provider listed on [email or web address] 02/04/2022, 3:31 PM

## 2022-02-05 ENCOUNTER — Inpatient Hospital Stay (HOSPITAL_COMMUNITY): Payer: 59

## 2022-02-05 ENCOUNTER — Telehealth: Payer: Self-pay | Admitting: Pulmonary Disease

## 2022-02-05 DIAGNOSIS — J441 Chronic obstructive pulmonary disease with (acute) exacerbation: Secondary | ICD-10-CM | POA: Diagnosis not present

## 2022-02-05 LAB — CBC WITH DIFFERENTIAL/PLATELET
Abs Immature Granulocytes: 0.19 10*3/uL — ABNORMAL HIGH (ref 0.00–0.07)
Basophils Absolute: 0 10*3/uL (ref 0.0–0.1)
Basophils Relative: 0 %
Eosinophils Absolute: 0 10*3/uL (ref 0.0–0.5)
Eosinophils Relative: 0 %
HCT: 40.4 % (ref 39.0–52.0)
Hemoglobin: 12.9 g/dL — ABNORMAL LOW (ref 13.0–17.0)
Immature Granulocytes: 2 %
Lymphocytes Relative: 5 %
Lymphs Abs: 0.7 10*3/uL (ref 0.7–4.0)
MCH: 31.2 pg (ref 26.0–34.0)
MCHC: 31.9 g/dL (ref 30.0–36.0)
MCV: 97.6 fL (ref 80.0–100.0)
Monocytes Absolute: 0.3 10*3/uL (ref 0.1–1.0)
Monocytes Relative: 3 %
Neutro Abs: 11.2 10*3/uL — ABNORMAL HIGH (ref 1.7–7.7)
Neutrophils Relative %: 90 %
Platelets: 282 10*3/uL (ref 150–400)
RBC: 4.14 MIL/uL — ABNORMAL LOW (ref 4.22–5.81)
RDW: 14.2 % (ref 11.5–15.5)
WBC: 12.4 10*3/uL — ABNORMAL HIGH (ref 4.0–10.5)
nRBC: 0 % (ref 0.0–0.2)

## 2022-02-05 LAB — RESPIRATORY PANEL BY PCR

## 2022-02-05 LAB — COMPREHENSIVE METABOLIC PANEL
ALT: 15 U/L (ref 0–44)
AST: 22 U/L (ref 15–41)
Albumin: 3 g/dL — ABNORMAL LOW (ref 3.5–5.0)
Alkaline Phosphatase: 53 U/L (ref 38–126)
Anion gap: 10 (ref 5–15)
BUN: 10 mg/dL (ref 8–23)
CO2: 22 mmol/L (ref 22–32)
Calcium: 8.7 mg/dL — ABNORMAL LOW (ref 8.9–10.3)
Chloride: 103 mmol/L (ref 98–111)
Creatinine, Ser: 1.05 mg/dL (ref 0.61–1.24)
GFR, Estimated: >60 mL/min (ref >60)
Glucose, Bld: 138 mg/dL — ABNORMAL HIGH (ref 70–99)
Potassium: 4.6 mmol/L (ref 3.5–5.1)
Sodium: 135 mmol/L (ref 135–145)
Total Bilirubin: 0.4 mg/dL (ref 0.3–1.2)
Total Protein: 6.4 g/dL — ABNORMAL LOW (ref 6.5–8.1)

## 2022-02-05 LAB — MRSA NEXT GEN BY PCR, NASAL: MRSA by PCR Next Gen: NOT DETECTED

## 2022-02-05 LAB — PHOSPHORUS: Phosphorus: 3.3 mg/dL (ref 2.5–4.6)

## 2022-02-05 LAB — MAGNESIUM: Magnesium: 1.9 mg/dL (ref 1.7–2.4)

## 2022-02-05 LAB — LEGIONELLA PNEUMOPHILA SEROGP 1 UR AG: L. pneumophila Serogp 1 Ur Ag: NEGATIVE

## 2022-02-05 LAB — PROCALCITONIN: Procalcitonin: <0.1 ng/mL

## 2022-02-05 LAB — EXPECTORATED SPUTUM ASSESSMENT W GRAM STAIN, RFLX TO RESP C

## 2022-02-05 LAB — PROTIME-INR
INR: 1 (ref 0.8–1.2)
Prothrombin Time: 12.8 seconds (ref 11.4–15.2)

## 2022-02-05 LAB — HIV ANTIBODY (ROUTINE TESTING W REFLEX): HIV Screen 4th Generation wRfx: NONREACTIVE

## 2022-02-05 MED ORDER — SACCHAROMYCES BOULARDII 250 MG PO CAPS
250.0000 mg | ORAL_CAPSULE | Freq: Two times a day (BID) | ORAL | Status: DC
  Administered 2022-02-05 – 2022-02-08 (×7): 250 mg via ORAL
  Filled 2022-02-05 (×7): qty 1

## 2022-02-05 MED ORDER — LOPERAMIDE HCL 2 MG PO CAPS: 2.0000 mg | ORAL_CAPSULE | ORAL | Status: DC | PRN

## 2022-02-05 MED ORDER — GUAIFENESIN-DM 100-10 MG/5ML PO SYRP
5.0000 mL | ORAL_SOLUTION | ORAL | Status: DC | PRN
  Administered 2022-02-05: 5 mL via ORAL
  Filled 2022-02-05 (×2): qty 5

## 2022-02-05 NOTE — Telephone Encounter (Signed)
Patient admitted currently at [facility].  Will need follow up with NP Wed/Thurs next week. Please schedule the patient and call him with appointment time.  Thank you!    Noe Gens, MSN, APRN, NP-C, AGACNP-BC [facility] 02/05/2022, 12:16 PM   Please see [email or web address] for pager details.   From 7A-7P if no response, please call [phone] After hours, please call ELink [phone]

## 2022-02-05 NOTE — Progress Notes (Signed)
Chaplain engaged in an initial visit with [name] by phone.  Chaplain worked to honor request for an Scientist, physiological, Event organiser.  At this time, [name] does not know who he would like to assign and he was also not feeling well today.  He stated that his head and stomach are hurting.  Chaplain also cannot complete paperwork at this time due to precautions that are in place.  Due to the use of documents and witnesses, Chaplain suggests that we wait on completing an Advanced Directive.  [name] was agreeable to this.   Chaplain offered support and a blessing of relief over him.     02/05/22 1200  Spiritual Encounters  Type of Visit Initial  Care provided to: Patient  Reason for visit Advance directives  Interventions  Spiritual Care Interventions Made Established relationship of care and support

## 2022-02-05 NOTE — Telephone Encounter (Signed)
All Apps are booked for next week. Pt has appt set up on 1/16 with Beth.

## 2022-02-05 NOTE — Progress Notes (Signed)
PROGRESS NOTE    MELVIN WHITEFORD  KGY:[id number] DOB: 03/30/1955 DOA: 02/03/2022 PCP: Lacinda Axon, MD   Brief Narrative:    HPI per Dr. Mitzi Hansen on 02/03/22 [name] is a pleasant 67 y.o. male with medical history significant for adenocarcinoma of the lung status postresection, renal cell carcinoma status post partial left nephrectomy, and COPD with chronic hypoxic respiratory failure, now presenting to the emergency department with cough, shortness of breath, fatigue, and bloody sputum.   Patient reports that he began to feel poorly the night of 01/29/2022 and the following day was experiencing increased shortness of breath, general malaise, chills, and blood-streaked sputum.  He was seen in the emergency department on 01/30/2022 where he had CT chest that was negative for PE but concerning for pneumonia.  He was started on Augmentin and azithromycin his pulmonologist called in a prednisone taper the following day.  Despite this, he reports that his condition has not improved at all and his hemoptysis has increased.   ED Course: Upon arrival to the ED, patient is found to be afebrile and saturating well on his usual 2 L/min of supplemental oxygen with normal heart rate and stable blood pressure.  Chest x-ray demonstrates emphysematous changes and minimal residual airspace opacity at the bases.  COVID, influenza, and RSV PCR is negative.  Labs notable for mild hyponatremia.   Blood cultures were collected in the ED and the patient was given 125 mg IV Solu-Medrol, 500 mL of saline, fentanyl, and multiple neb treatments.   **Interim History  No further bloody sputum noted today and is doing better from respiratory standpoint.  He still has very poor appetite and is starting to complain of some diarrhea.  Assessment & Plan:   Principal Problem:   COPD with acute exacerbation ([place]) Active Problems:   Hemoptysis   Chronic respiratory failure with hypoxia ([place])   Community  acquired pneumonia   Hyponatremia  Assessment and Plan:  COPD with acute exacerbation with Associated pneumonia-improved Acute on chronic hypoxic respiratory failure-now back to baseline 2 L nasal cannula -Continuous Pulse Oximetry and Maintain O2 Saturations >90% -Continue Supplemental O2 via [place] and [place] O2 as Tolerated -Will need an Ambulatory Home O2 Screen prior to D/C  SpO2: 96 % O2 Flow Rate (L/min): 2 L/min -Failed to improve with outpatient antibiotic and prednisone  -Treated with 125 mg IV Solu-Medrol and multiple neb treatments in ED, now on oral prednisone -Culture sputum, check strep pneumo and legionella antigens,  -Was getting IVF hydration with NS at 100 mL/hr and will change to 75 mL/hr x1 day -Started Rocephin and azithromycin and will continue -Trend Procalcitonin and went from 0.17 -> <0.10 -Schedule SAMA-SABA and use additional SABA as-needed  with Nebs Duonebs -C/w Flutter Valve, Incentive Spirometry, and Guaifenesin 1200 mg po BID  -May need to repeat imaging -C/w Antitussives  -Check MRSA, Respiratory Virus Panel, and Sputum Cx per Pulmonary reccs -Appreciate ongoing pulmonology evaluation   Hemoptysis  -Cough has been productive of blood-streaked sputum and small clots for ~1 wk but now states is progressively getting worse and it is now quarter size -No PE seen on CTA from 12/28, but pneumonia noted  -Continue antibiotics and supportive care -Appears resolved at this point, continue to monitor  Diarrhea -Check GI pathogen panel -Imodium ordered as needed -Continue IV fluid monitor   Normocytic Anemia-stable -Hgb/Hct Trend: -Check Anemia Panel in the AM  -Continue to Monitor for S/Sx of Bleeding; No overt bleeding noted -Repeat  CBC in the AM   Hx of Adenocarcinoma of the Lung s/p Resection Hx of Renal Cell Carcinoma s/p Partial Left Nephrectomy -Follow up with outpatient specialists    GERD/GI prophylaxis -Continue with Pantoprazole 40 mg p.o.  daily   Hyperlipidemia -Continue Rosuvastatin 20 g p.o. daily   DVT prophylaxis: SCDs Start: 02/03/22 2256     Code Status: Full Code Family Communication: No family present at bedside    Disposition Plan:  Level of care: Med-Surg Status is: Inpatient Remains inpatient appropriate because: Needs further clinical improvement and if not improving will need Pulmonary Consultation    Consultants:  Pulmonary    Procedures:  As delineated as above   Antimicrobials:  Anti-infectives (From admission, onward)    Start     Dose/Rate Route Frequency Ordered Stop   02/04/22 1000  azithromycin (ZITHROMAX) tablet 500 mg        500 mg Oral Daily 02/03/22 2258 02/09/22 0959   02/03/22 2300  cefTRIAXone (ROCEPHIN) 2 g in sodium chloride 0.9 % 100 mL IVPB        2 g 200 mL/hr over 30 Minutes Intravenous Every 24 hours 02/03/22 2258 02/08/22 2259       Subjective: Patient seen and evaluated today with poor oral intake and appetite as well as diarrhea that has started overnight.  He states that his breathing has improved and he denies any significant cough or hemoptysis.  He is having a sore throat and headache this morning.  Objective: Vitals:   02/04/22 2000 02/04/22 2013 02/05/22 0015 02/05/22 0419  BP:  (!) 135/93 (!) 152/97 128/74  Pulse:  70 73 63  Resp:  18 18 16   Temp:  97.7 F (36.5 C) (!) 97.4 F (36.3 C) (!) 97.4 F (36.3 C)  TempSrc:  Oral Oral Oral  SpO2:  95% 93% 95%  Weight: 71.1 kg 71.1 kg    Height: 5\' 9"  (1.753 m) 5\' 9"  (1.753 m)     No intake or output data in the 24 hours ending 02/05/22 0738 Filed Weights   02/03/22 2036 02/04/22 2000 02/04/22 2013  Weight: 72.6 kg 71.1 kg 71.1 kg    Examination:  General exam: Appears calm and comfortable  Respiratory system: Clear to auscultation. Respiratory effort normal.  2 L [place] Cardiovascular system: S1 & S2 heard, RRR.  Gastrointestinal system: Abdomen is soft Central nervous system: Alert and  awake Extremities: No edema Skin: No significant lesions noted Psychiatry: Flat affect.    Data Reviewed: I have personally reviewed following labs and imaging studies  CBC: Recent Labs  Lab 01/30/22 1530 02/03/22 1918 02/04/22 0418 02/05/22 0402  WBC 25.6* 7.1 5.5 12.4*  NEUTROABS  --  6.2  --  11.2*  HGB 14.7 14.4 12.4* 12.9*  HCT 44.3 43.9 37.7* 40.4  MCV 95.1 95.0 95.2 97.6  PLT 281 269 251 779   Basic Metabolic Panel: Recent Labs  Lab 01/30/22 1530 02/03/22 1918 02/04/22 0418 02/05/22 0402  NA 135 132* 135 135  K 4.2 4.3 4.0 4.6  CL 100 101 107 103  CO2 23 20* 21* 22  GLUCOSE 83 99 177* 138*  BUN 16 9 9 10   CREATININE 1.34* 1.07 0.99 1.05  CALCIUM 9.7 8.9 8.3* 8.7*  MG  --   --  1.8 1.9  PHOS  --   --   --  3.3   GFR: Estimated Creatinine Clearance: 69.2 mL/min (by C-G formula based on SCr of 1.05 mg/dL). Liver Function Tests: Recent  Labs  Lab 02/03/22 1918 02/05/22 0402  AST 16 22  ALT 16 15  ALKPHOS 63 53  BILITOT 0.6 0.4  PROT 7.2 6.4*  ALBUMIN 3.9 3.0*   No results for input(s): "LIPASE", "AMYLASE" in the last 168 hours. No results for input(s): "AMMONIA" in the last 168 hours. Coagulation Profile: Recent Labs  Lab 02/05/22 0402  INR 1.0   Cardiac Enzymes: No results for input(s): "CKTOTAL", "CKMB", "CKMBINDEX", "TROPONINI" in the last 168 hours. BNP (last 3 results) No results for input(s): "PROBNP" in the last 8760 hours. HbA1C: No results for input(s): "HGBA1C" in the last 72 hours. CBG: No results for input(s): "GLUCAP" in the last 168 hours. Lipid Profile: No results for input(s): "CHOL", "HDL", "LDLCALC", "TRIG", "CHOLHDL", "LDLDIRECT" in the last 72 hours. Thyroid Function Tests: No results for input(s): "TSH", "T4TOTAL", "FREET4", "T3FREE", "THYROIDAB" in the last 72 hours. Anemia Panel: No results for input(s): "VITAMINB12", "FOLATE", "FERRITIN", "TIBC", "IRON", "RETICCTPCT" in the last 72 hours. Sepsis Labs: Recent Labs   Lab 02/03/22 1918 02/03/22 1928 02/04/22 0418 02/05/22 0402  PROCALCITON  --  0.17 <0.10 <0.10  LATICACIDVEN 1.1  --   --   --     Recent Results (from the past 240 hour(s))  Resp panel by RT-PCR (RSV, Flu A&B, Covid) Anterior Nasal Swab     Status: None   Collection Time: 01/30/22  3:30 PM   Specimen: Anterior Nasal Swab  Result Value Ref Range Status   SARS Coronavirus 2 by RT PCR NEGATIVE NEGATIVE Final    Comment: (NOTE) SARS-CoV-2 target nucleic acids are NOT DETECTED.  The SARS-CoV-2 RNA is generally detectable in upper respiratory specimens during the acute phase of infection. The lowest concentration of SARS-CoV-2 viral copies this assay can detect is 138 copies/mL. A negative result does not preclude SARS-Cov-2 infection and should not be used as the sole basis for treatment or other patient management decisions. A negative result may occur with  improper specimen collection/handling, submission of specimen other than nasopharyngeal swab, presence of viral mutation(s) within the areas targeted by this assay, and inadequate number of viral copies(<138 copies/mL). A negative result must be combined with clinical observations, patient history, and epidemiological information. The expected result is Negative.  Fact Sheet for Patients:  EntrepreneurPulse.com.au  Fact Sheet for Healthcare Providers:  IncredibleEmployment.be  This test is no t yet approved or cleared by the [place] FDA and  has been authorized for detection and/or diagnosis of SARS-CoV-2 by FDA under an Emergency Use Authorization (EUA). This EUA will remain  in effect (meaning this test can be used) for the duration of the COVID-19 declaration under Section 564(b)(1) of the Act, 21 U.S.C.section 360bbb-3(b)(1), unless the authorization is terminated  or revoked sooner.       Influenza A by PCR NEGATIVE NEGATIVE Final   Influenza B by PCR NEGATIVE NEGATIVE  Final    Comment: (NOTE) The Xpert Xpress SARS-CoV-2/FLU/RSV plus assay is intended as an aid in the diagnosis of influenza from Nasopharyngeal swab specimens and should not be used as a sole basis for treatment. Nasal washings and aspirates are unacceptable for Xpert Xpress SARS-CoV-2/FLU/RSV testing.  Fact Sheet for Patients: EntrepreneurPulse.com.au  Fact Sheet for Healthcare Providers: IncredibleEmployment.be  This test is not yet approved or cleared by the [place] FDA and has been authorized for detection and/or diagnosis of SARS-CoV-2 by FDA under an Emergency Use Authorization (EUA). This EUA will remain in effect (meaning this test can be used) for the  duration of the COVID-19 declaration under Section 564(b)(1) of the Act, 21 U.S.C. section 360bbb-3(b)(1), unless the authorization is terminated or revoked.     Resp Syncytial Virus by PCR NEGATIVE NEGATIVE Final    Comment: (NOTE) Fact Sheet for Patients: EntrepreneurPulse.com.au  Fact Sheet for Healthcare Providers: IncredibleEmployment.be  This test is not yet approved or cleared by the [place] FDA and has been authorized for detection and/or diagnosis of SARS-CoV-2 by FDA under an Emergency Use Authorization (EUA). This EUA will remain in effect (meaning this test can be used) for the duration of the COVID-19 declaration under Section 564(b)(1) of the Act, 21 U.S.C. section 360bbb-3(b)(1), unless the authorization is terminated or revoked.  Performed at [facility], [address] [place], [place] [address]   Culture, blood (routine x 2)     Status: None (Preliminary result)   Collection Time: 02/03/22  7:18 PM   Specimen: BLOOD LEFT ARM  Result Value Ref Range Status   Specimen Description   Final    BLOOD LEFT ARM Performed at [facility] [address] [place], [place] [address]    Special  Requests   Final    BOTTLES DRAWN AEROBIC AND ANAEROBIC Blood Culture adequate volume Performed at [facility] [address], [place], [place] [address]    Culture   Final    NO GROWTH < 12 HOURS Performed at [facility] [address] [place], [place] [address]    Report Status PENDING  Incomplete  Culture, blood (routine x 2)     Status: None (Preliminary result)   Collection Time: 02/03/22  7:18 PM   Specimen: BLOOD LEFT ARM  Result Value Ref Range Status   Specimen Description   Final    BLOOD LEFT ARM Performed at [facility], [place] [address] [place], [place] [address]    Special Requests   Final    BOTTLES DRAWN AEROBIC AND ANAEROBIC Blood Culture adequate volume Performed at [facility] [address], [place], [place] [address]    Culture   Final    NO GROWTH < 12 HOURS Performed at [facility] [address] [place], [place] [address]    Report Status PENDING  Incomplete  Resp panel by RT-PCR (RSV, Flu A&B, Covid) Anterior Nasal Swab     Status: None   Collection Time: 02/03/22  8:49 PM   Specimen: Anterior Nasal Swab  Result Value Ref Range Status   SARS Coronavirus 2 by RT PCR NEGATIVE NEGATIVE Final    Comment: (NOTE) SARS-CoV-2 target nucleic acids are NOT DETECTED.  The SARS-CoV-2 RNA is generally detectable in upper respiratory specimens during the acute phase of infection. The lowest concentration of SARS-CoV-2 viral copies this assay can detect is 138 copies/mL. A negative result does not preclude SARS-Cov-2 infection and should not be used as the sole basis for treatment or other patient management decisions. A negative result may occur with  improper specimen collection/handling, submission of specimen other than nasopharyngeal swab, presence of viral mutation(s) within the areas targeted by this assay, and inadequate number of viral copies(<138 copies/mL). A negative result must be  combined with clinical observations, patient history, and epidemiological information. The expected result is Negative.  Fact Sheet for Patients:  EntrepreneurPulse.com.au  Fact Sheet for Healthcare Providers:  IncredibleEmployment.be  This test is no t yet approved or cleared by the [place] FDA and  has been authorized for detection  and/or diagnosis of SARS-CoV-2 by FDA under an Emergency Use Authorization (EUA). This EUA will remain  in effect (meaning this test can be used) for the duration of the COVID-19 declaration under Section 564(b)(1) of the Act, 21 U.S.C.section 360bbb-3(b)(1), unless the authorization is terminated  or revoked sooner.       Influenza A by PCR NEGATIVE NEGATIVE Final   Influenza B by PCR NEGATIVE NEGATIVE Final    Comment: (NOTE) The Xpert Xpress SARS-CoV-2/FLU/RSV plus assay is intended as an aid in the diagnosis of influenza from Nasopharyngeal swab specimens and should not be used as a sole basis for treatment. Nasal washings and aspirates are unacceptable for Xpert Xpress SARS-CoV-2/FLU/RSV testing.  Fact Sheet for Patients: EntrepreneurPulse.com.au  Fact Sheet for Healthcare Providers: IncredibleEmployment.be  This test is not yet approved or cleared by the [place] FDA and has been authorized for detection and/or diagnosis of SARS-CoV-2 by FDA under an Emergency Use Authorization (EUA). This EUA will remain in effect (meaning this test can be used) for the duration of the COVID-19 declaration under Section 564(b)(1) of the Act, 21 U.S.C. section 360bbb-3(b)(1), unless the authorization is terminated or revoked.     Resp Syncytial Virus by PCR NEGATIVE NEGATIVE Final    Comment: (NOTE) Fact Sheet for Patients: EntrepreneurPulse.com.au  Fact Sheet for Healthcare Providers: IncredibleEmployment.be  This test is not yet  approved or cleared by the [place] FDA and has been authorized for detection and/or diagnosis of SARS-CoV-2 by FDA under an Emergency Use Authorization (EUA). This EUA will remain in effect (meaning this test can be used) for the duration of the COVID-19 declaration under Section 564(b)(1) of the Act, 21 U.S.C. section 360bbb-3(b)(1), unless the authorization is terminated or revoked.  Performed at [facility], [address] [place], [place] [address]          Radiology Studies: DG Chest 2 View  Result Date: 02/03/2022 CLINICAL DATA:  Pneumonia with worsening symptoms. EXAM: CHEST - 2 VIEW COMPARISON:  01/30/2022. FINDINGS: The heart size and mediastinal contours are stable. There is atherosclerotic calcification. Stable emphysematous changes and scarring are noted bilaterally. Minimal residual airspace opacities are present at the lung bases. No consolidation, effusion, or pneumothorax. No acute osseous abnormality. IMPRESSION: 1. Minimal residual airspace opacities at the lung bases. 2. Emphysema. Electronically Signed   By: Brett Fairy M.D.   On: 02/03/2022 20:00        Scheduled Meds:  azithromycin  500 mg Oral Daily   DULoxetine  30 mg Oral Daily   guaiFENesin  1,200 mg Oral BID   ipratropium-albuterol  3 mL Nebulization TID   isosorbide mononitrate  15 mg Oral Daily   pantoprazole  40 mg Oral Daily   predniSONE  40 mg Oral Q breakfast   rosuvastatin  20 mg Oral Daily   tamsulosin  0.4 mg Oral Daily   Continuous Infusions:  sodium chloride 75 mL/hr at 02/04/22 2021   cefTRIAXone (ROCEPHIN)  IV 2 g (02/04/22 2224)     LOS: 1 day    Time spent: 35 minutes    Teagan Heidrick Darleen Crocker, DO [facility]  If 7PM-7AM, please contact night-coverage [email or web address] 02/05/2022, 7:38 AM

## 2022-02-06 DIAGNOSIS — J441 Chronic obstructive pulmonary disease with (acute) exacerbation: Secondary | ICD-10-CM | POA: Diagnosis not present

## 2022-02-06 LAB — BASIC METABOLIC PANEL
Anion gap: 6 (ref 5–15)
BUN: 13 mg/dL (ref 8–23)
CO2: 24 mmol/L (ref 22–32)
Calcium: 8.4 mg/dL — ABNORMAL LOW (ref 8.9–10.3)
Chloride: 106 mmol/L (ref 98–111)
Creatinine, Ser: 1.03 mg/dL (ref 0.61–1.24)
GFR, Estimated: >60 mL/min (ref >60)
Glucose, Bld: 104 mg/dL — ABNORMAL HIGH (ref 70–99)
Potassium: 4 mmol/L (ref 3.5–5.1)
Sodium: 136 mmol/L (ref 135–145)

## 2022-02-06 LAB — CBC
HCT: 37.5 % — ABNORMAL LOW (ref 39.0–52.0)
Hemoglobin: 12 g/dL — ABNORMAL LOW (ref 13.0–17.0)
MCH: 31.1 pg (ref 26.0–34.0)
MCHC: 32 g/dL (ref 30.0–36.0)
MCV: 97.2 fL (ref 80.0–100.0)
Platelets: 264 10*3/uL (ref 150–400)
RBC: 3.86 MIL/uL — ABNORMAL LOW (ref 4.22–5.81)
RDW: 14.5 % (ref 11.5–15.5)
WBC: 11.6 10*3/uL — ABNORMAL HIGH (ref 4.0–10.5)
nRBC: 0 % (ref 0.0–0.2)

## 2022-02-06 LAB — GASTROINTESTINAL PANEL BY PCR, STOOL (REPLACES STOOL CULTURE)

## 2022-02-06 LAB — MAGNESIUM: Magnesium: 2 mg/dL (ref 1.7–2.4)

## 2022-02-06 MED ORDER — MELATONIN 5 MG PO TABS
5.0000 mg | ORAL_TABLET | Freq: Every evening | ORAL | Status: AC | PRN
  Administered 2022-02-06 – 2022-02-07 (×2): 5 mg via ORAL
  Filled 2022-02-06 (×2): qty 1

## 2022-02-06 MED ORDER — IPRATROPIUM-ALBUTEROL 0.5-2.5 (3) MG/3ML IN SOLN
3.0000 mL | Freq: Two times a day (BID) | RESPIRATORY_TRACT | Status: DC
  Administered 2022-02-07: 3 mL via RESPIRATORY_TRACT
  Filled 2022-02-06 (×3): qty 3

## 2022-02-06 NOTE — Progress Notes (Signed)
Brief PCCM progress note:  Respiratory viral panel negative.  Some GPC's on sputum culture.  Suspect yeast is a colonizer, normal colonizer.  If no dominant pathogen or pathogen that is susceptible to penicillins, recommend discharge with total course 4 weeks, okay with Augmentin.  Can subtract prior days on Augmentin and days on antibiotics in the hospital from 4-week total.  If a pathogen emerges, please contact us for further evaluation.  PCCM is available as needed

## 2022-02-06 NOTE — Progress Notes (Signed)
PROGRESS NOTE    [name]  STM:[id number] DOB: 08-01-1955 DOA: 02/03/2022 PCP: Lacinda Axon, MD   Brief Narrative:    HPI per Dr. Mitzi Hansen on 02/03/22 [name] is a pleasant 67 y.o. male with medical history significant for adenocarcinoma of the lung status postresection, renal cell carcinoma status post partial left nephrectomy, and COPD with chronic hypoxic respiratory failure, now presenting to the emergency department with cough, shortness of breath, fatigue, and bloody sputum.   Patient reports that he began to feel poorly the night of 01/29/2022 and the following day was experiencing increased shortness of breath, general malaise, chills, and blood-streaked sputum.  He was seen in the emergency department on 01/30/2022 where he had CT chest that was negative for PE but concerning for pneumonia.  He was started on Augmentin and azithromycin his pulmonologist called in a prednisone taper the following day.  Despite this, he reports that his condition has not improved at all and his hemoptysis has increased.   ED Course: Upon arrival to the ED, patient is found to be afebrile and saturating well on his usual 2 L/min of supplemental oxygen with normal heart rate and stable blood pressure.  Chest x-ray demonstrates emphysematous changes and minimal residual airspace opacity at the bases.  COVID, influenza, and RSV PCR is negative.  Labs notable for mild hyponatremia.   Blood cultures were collected in the ED and the patient was given 125 mg IV Solu-Medrol, 500 mL of saline, fentanyl, and multiple neb treatments.   **Interim History  No further bloody sputum noted today and is doing better from respiratory standpoint.  He still has very poor appetite and is starting to complain of some worsening diarrhea.  GI pathogen panel pending and C. difficile ordered.  Assessment & Plan:   Principal Problem:   COPD with acute exacerbation ([place]) Active Problems:   Hemoptysis    Chronic respiratory failure with hypoxia ([place])   Community acquired pneumonia   Hyponatremia  Assessment and Plan:   COPD with acute exacerbation with Associated pneumonia-improved Acute on chronic hypoxic respiratory failure-now back to baseline 2 L nasal cannula -Continuous Pulse Oximetry and Maintain O2 Saturations >90% -Continue Supplemental O2 via [place] and [place] O2 as Tolerated -Will need an Ambulatory Home O2 Screen prior to D/C  SpO2: 96 % O2 Flow Rate (L/min): 2 L/min -Failed to improve with outpatient antibiotic and prednisone  -Treated with 125 mg IV Solu-Medrol and multiple neb treatments in ED, now on oral prednisone -Culture sputum, check strep pneumo and legionella antigens,  -Was getting IVF hydration with NS at 100 mL/hr and will change to 75 mL/hr x1 day -Started Rocephin and azithromycin and will continue -Trend Procalcitonin and went from 0.17 -> <0.10 -Schedule SAMA-SABA and use additional SABA as-needed  with Nebs Duonebs -C/w Flutter Valve, Incentive Spirometry, and Guaifenesin 1200 mg po BID  -May need to repeat imaging -C/w Antitussives  -Check MRSA, respiratory virus panel -Pulmonology to follow-up outpatient   Hemoptysis-resolved -Cough has been productive of blood-streaked sputum and small clots for ~1 wk but now states is progressively getting worse and it is now quarter size -No PE seen on CTA from 12/28, but pneumonia noted  -Continue antibiotics and supportive care -Appears resolved at this point, continue to monitor   Diarrhea-worsening -Check GI pathogen panel pending -Check C. difficile 01/4 after discussion with Dr. Johnnye Sima -Imodium ordered as needed -Continue IV fluid monitor   Normocytic Anemia-stable -Hgb/Hct Trend: -Check Anemia Panel in  the AM  -Continue to Monitor for S/Sx of Bleeding; No overt bleeding noted -Repeat CBC in the AM   Hx of Adenocarcinoma of the Lung s/p Resection Hx of Renal Cell Carcinoma s/p Partial Left  Nephrectomy -Follow up with outpatient specialists    GERD/GI prophylaxis -Continue with Pantoprazole 40 mg p.o. daily   Hyperlipidemia -Continue Rosuvastatin 20 g p.o. daily   DVT prophylaxis: SCDs Start: 02/03/22 2256     Code Status: Full Code Family Communication: No family present at bedside    Disposition Plan:  Level of care: Med-Surg Status is: Inpatient Remains inpatient appropriate because: Needs further clinical improvement and if not improving will need Pulmonary Consultation    Consultants:  Pulmonary    Procedures:  As delineated as above   Antimicrobials:  Anti-infectives (From admission, onward)    Start     Dose/Rate Route Frequency Ordered Stop   02/04/22 1000  azithromycin (ZITHROMAX) tablet 500 mg        500 mg Oral Daily 02/03/22 2258 02/09/22 0959   02/03/22 2300  cefTRIAXone (ROCEPHIN) 2 g in sodium chloride 0.9 % 100 mL IVPB        2 g 200 mL/hr over 30 Minutes Intravenous Every 24 hours 02/03/22 2258 02/08/22 2259       Subjective: Patient seen and evaluated today with increasing frequency of liquidy bowel movements noted this morning.  He is also noticing abdominal cramping.  No fevers noted overnight.  He is tolerating diet with no nausea or vomiting.  He denies any further hemoptysis.  Objective: Vitals:   02/05/22 1301 02/05/22 2020 02/06/22 0557 02/06/22 0926  BP: 109/73 110/69 128/82   Pulse: 65 69 (!) 54   Resp: 18 19 16    Temp: (!) 97.5 F (36.4 C) 97.8 F (36.6 C) 97.8 F (36.6 C)   TempSrc: Oral Oral Oral   SpO2: 96% 94% 91% 92%  Weight:      Height:       No intake or output data in the 24 hours ending 02/06/22 1104 Filed Weights   02/03/22 2036 02/04/22 2000 02/04/22 2013  Weight: 72.6 kg 71.1 kg 71.1 kg    Examination:  General exam: Appears calm and comfortable  Respiratory system: Clear to auscultation. Respiratory effort normal.  2 L nasal cannula oxygen Cardiovascular system: S1 & S2 heard, RRR.   Gastrointestinal system: Abdomen is soft, tender to palpation throughout Central nervous system: Alert and awake Extremities: No edema Skin: No significant lesions noted Psychiatry: Flat affect.    Data Reviewed: I have personally reviewed following labs and imaging studies  CBC: Recent Labs  Lab 01/30/22 1530 02/03/22 1918 02/04/22 0418 02/05/22 0402 02/06/22 0343  WBC 25.6* 7.1 5.5 12.4* 11.6*  NEUTROABS  --  6.2  --  11.2*  --   HGB 14.7 14.4 12.4* 12.9* 12.0*  HCT 44.3 43.9 37.7* 40.4 37.5*  MCV 95.1 95.0 95.2 97.6 97.2  PLT 281 269 251 282 546   Basic Metabolic Panel: Recent Labs  Lab 01/30/22 1530 02/03/22 1918 02/04/22 0418 02/05/22 0402 02/06/22 0343  NA 135 132* 135 135 136  K 4.2 4.3 4.0 4.6 4.0  CL 100 101 107 103 106  CO2 23 20* 21* 22 24  GLUCOSE 83 99 177* 138* 104*  BUN 16 9 9 10 13   CREATININE 1.34* 1.07 0.99 1.05 1.03  CALCIUM 9.7 8.9 8.3* 8.7* 8.4*  MG  --   --  1.8 1.9 2.0  PHOS  --   --   --  3.3  --    GFR: Estimated Creatinine Clearance: 70.5 mL/min (by C-G formula based on SCr of 1.03 mg/dL). Liver Function Tests: Recent Labs  Lab 02/03/22 1918 02/05/22 0402  AST 16 22  ALT 16 15  ALKPHOS 63 53  BILITOT 0.6 0.4  PROT 7.2 6.4*  ALBUMIN 3.9 3.0*   No results for input(s): "LIPASE", "AMYLASE" in the last 168 hours. No results for input(s): "AMMONIA" in the last 168 hours. Coagulation Profile: Recent Labs  Lab 02/05/22 0402  INR 1.0   Cardiac Enzymes: No results for input(s): "CKTOTAL", "CKMB", "CKMBINDEX", "TROPONINI" in the last 168 hours. BNP (last 3 results) No results for input(s): "PROBNP" in the last 8760 hours. HbA1C: No results for input(s): "HGBA1C" in the last 72 hours. CBG: No results for input(s): "GLUCAP" in the last 168 hours. Lipid Profile: No results for input(s): "CHOL", "HDL", "LDLCALC", "TRIG", "CHOLHDL", "LDLDIRECT" in the last 72 hours. Thyroid Function Tests: No results for input(s): "TSH",  "T4TOTAL", "FREET4", "T3FREE", "THYROIDAB" in the last 72 hours. Anemia Panel: No results for input(s): "VITAMINB12", "FOLATE", "FERRITIN", "TIBC", "IRON", "RETICCTPCT" in the last 72 hours. Sepsis Labs: Recent Labs  Lab 02/03/22 1918 02/03/22 1928 02/04/22 0418 02/05/22 0402  PROCALCITON  --  0.17 <0.10 <0.10  LATICACIDVEN 1.1  --   --   --     Recent Results (from the past 240 hour(s))  Resp panel by RT-PCR (RSV, Flu A&B, Covid) Anterior Nasal Swab     Status: None   Collection Time: 01/30/22  3:30 PM   Specimen: Anterior Nasal Swab  Result Value Ref Range Status   SARS Coronavirus 2 by RT PCR NEGATIVE NEGATIVE Final    Comment: (NOTE) SARS-CoV-2 target nucleic acids are NOT DETECTED.  The SARS-CoV-2 RNA is generally detectable in upper respiratory specimens during the acute phase of infection. The lowest concentration of SARS-CoV-2 viral copies this assay can detect is 138 copies/mL. A negative result does not preclude SARS-Cov-2 infection and should not be used as the sole basis for treatment or other patient management decisions. A negative result may occur with  improper specimen collection/handling, submission of specimen other than nasopharyngeal swab, presence of viral mutation(s) within the areas targeted by this assay, and inadequate number of viral copies(<138 copies/mL). A negative result must be combined with clinical observations, patient history, and epidemiological information. The expected result is Negative.  Fact Sheet for Patients:  EntrepreneurPulse.com.au  Fact Sheet for Healthcare Providers:  IncredibleEmployment.be  This test is no t yet approved or cleared by the [place] FDA and  has been authorized for detection and/or diagnosis of SARS-CoV-2 by FDA under an Emergency Use Authorization (EUA). This EUA will remain  in effect (meaning this test can be used) for the duration of the COVID-19 declaration  under Section 564(b)(1) of the Act, 21 U.S.C.section 360bbb-3(b)(1), unless the authorization is terminated  or revoked sooner.       Influenza A by PCR NEGATIVE NEGATIVE Final   Influenza B by PCR NEGATIVE NEGATIVE Final    Comment: (NOTE) The Xpert Xpress SARS-CoV-2/FLU/RSV plus assay is intended as an aid in the diagnosis of influenza from Nasopharyngeal swab specimens and should not be used as a sole basis for treatment. Nasal washings and aspirates are unacceptable for Xpert Xpress SARS-CoV-2/FLU/RSV testing.  Fact Sheet for Patients: EntrepreneurPulse.com.au  Fact Sheet for Healthcare Providers: IncredibleEmployment.be  This test is not yet approved or cleared by the [place] FDA and has been authorized for detection and/or  diagnosis of SARS-CoV-2 by FDA under an Emergency Use Authorization (EUA). This EUA will remain in effect (meaning this test can be used) for the duration of the COVID-19 declaration under Section 564(b)(1) of the Act, 21 U.S.C. section 360bbb-3(b)(1), unless the authorization is terminated or revoked.     Resp Syncytial Virus by PCR NEGATIVE NEGATIVE Final    Comment: (NOTE) Fact Sheet for Patients: EntrepreneurPulse.com.au  Fact Sheet for Healthcare Providers: IncredibleEmployment.be  This test is not yet approved or cleared by the [place] FDA and has been authorized for detection and/or diagnosis of SARS-CoV-2 by FDA under an Emergency Use Authorization (EUA). This EUA will remain in effect (meaning this test can be used) for the duration of the COVID-19 declaration under Section 564(b)(1) of the Act, 21 U.S.C. section 360bbb-3(b)(1), unless the authorization is terminated or revoked.  Performed at [facility], [address], [place], [place] [address]   Culture, blood (routine x 2)     Status: None (Preliminary result)   Collection  Time: 02/03/22  7:18 PM   Specimen: BLOOD LEFT ARM  Result Value Ref Range Status   Specimen Description   Final    BLOOD LEFT ARM Performed at [facility] [address] [place], [place] [address]    Special Requests   Final    BOTTLES DRAWN AEROBIC AND ANAEROBIC Blood Culture adequate volume Performed at [facility] [address], [place], [place] [address]    Culture   Final    NO GROWTH 3 DAYS Performed at [facility], [place] [address], [place], [place] [address]    Report Status PENDING  Incomplete  Culture, blood (routine x 2)     Status: None (Preliminary result)   Collection Time: 02/03/22  7:18 PM   Specimen: BLOOD LEFT ARM  Result Value Ref Range Status   Specimen Description   Final    BLOOD LEFT ARM Performed at [facility], [place] [address] [place], [place] [address]    Special Requests   Final    BOTTLES DRAWN AEROBIC AND ANAEROBIC Blood Culture adequate volume Performed at [facility] [address], [place], [place] [address]    Culture   Final    NO GROWTH 3 DAYS Performed at [facility], [place] [address], [place], [place] [address]    Report Status PENDING  Incomplete  Resp panel by RT-PCR (RSV, Flu A&B, Covid) Anterior Nasal Swab     Status: None   Collection Time: 02/03/22  8:49 PM   Specimen: Anterior Nasal Swab  Result Value Ref Range Status   SARS Coronavirus 2 by RT PCR NEGATIVE NEGATIVE Final    Comment: (NOTE) SARS-CoV-2 target nucleic acids are NOT DETECTED.  The SARS-CoV-2 RNA is generally detectable in upper respiratory specimens during the acute phase of infection. The lowest concentration of SARS-CoV-2 viral copies this assay can detect is 138 copies/mL. A negative result does not preclude SARS-Cov-2 infection and should not be used as the sole basis for treatment or other patient management decisions. A negative result may occur with  improper specimen  collection/handling, submission of specimen other than nasopharyngeal swab, presence of viral mutation(s) within the areas targeted by this assay, and inadequate number of viral copies(<138 copies/mL). A negative result must be combined with clinical observations, patient history, and epidemiological information. The expected result is Negative.  Fact Sheet for Patients:  EntrepreneurPulse.com.au  Fact Sheet for Healthcare Providers:  IncredibleEmployment.be  This test is no t yet approved or cleared by the [place] and  has been authorized for detection and/or diagnosis of SARS-CoV-2 by FDA under an Emergency Use Authorization (EUA). This EUA will remain  in effect (meaning this test can be used) for the duration of the COVID-19 declaration under Section 564(b)(1) of the Act, 21 U.S.C.section 360bbb-3(b)(1), unless the authorization is terminated  or revoked sooner.       Influenza A by PCR NEGATIVE NEGATIVE Final   Influenza B by PCR NEGATIVE NEGATIVE Final    Comment: (NOTE) The Xpert Xpress SARS-CoV-2/FLU/RSV plus assay is intended as an aid in the diagnosis of influenza from Nasopharyngeal swab specimens and should not be used as a sole basis for treatment. Nasal washings and aspirates are unacceptable for Xpert Xpress SARS-CoV-2/FLU/RSV testing.  Fact Sheet for Patients: EntrepreneurPulse.com.au  Fact Sheet for Healthcare Providers: IncredibleEmployment.be  This test is not yet approved or cleared by the [place] FDA and has been authorized for detection and/or diagnosis of SARS-CoV-2 by FDA under an Emergency Use Authorization (EUA). This EUA will remain in effect (meaning this test can be used) for the duration of the COVID-19 declaration under Section 564(b)(1) of the Act, 21 U.S.C. section 360bbb-3(b)(1), unless the authorization is terminated or revoked.     Resp Syncytial  Virus by PCR NEGATIVE NEGATIVE Final    Comment: (NOTE) Fact Sheet for Patients: EntrepreneurPulse.com.au  Fact Sheet for Healthcare Providers: IncredibleEmployment.be  This test is not yet approved or cleared by the [place] FDA and has been authorized for detection and/or diagnosis of SARS-CoV-2 by FDA under an Emergency Use Authorization (EUA). This EUA will remain in effect (meaning this test can be used) for the duration of the COVID-19 declaration under Section 564(b)(1) of the Act, 21 U.S.C. section 360bbb-3(b)(1), unless the authorization is terminated or revoked.  Performed at [facility], [place] [address], [place], [place] [address]   MRSA Next Gen by PCR, Nasal     Status: None   Collection Time: 02/04/22  5:32 PM   Specimen: Nasal Mucosa; Nasal Swab  Result Value Ref Range Status   MRSA by PCR Next Gen NOT DETECTED NOT DETECTED Final    Comment: (NOTE) The GeneXpert MRSA Assay (FDA approved for NASAL specimens only), is one component of a comprehensive MRSA colonization surveillance program. It is not intended to diagnose MRSA infection nor to guide or monitor treatment for MRSA infections. Test performance is not FDA approved in patients less than 33 years old. Performed at [facility], [place] [address], [place], [place] [address]   Respiratory (~20 pathogens) panel by PCR     Status: None   Collection Time: 02/04/22  5:32 PM   Specimen: Nasal Mucosa; Respiratory  Result Value Ref Range Status   Adenovirus NOT DETECTED NOT DETECTED Final   Coronavirus 229E NOT DETECTED NOT DETECTED Final    Comment: (NOTE) The Coronavirus on the Respiratory Panel, DOES NOT test for the novel  Coronavirus (2019 nCoV)    Coronavirus HKU1 NOT DETECTED NOT DETECTED Final   Coronavirus NL63 NOT DETECTED NOT DETECTED Final   Coronavirus OC43 NOT DETECTED NOT DETECTED Final   Metapneumovirus NOT DETECTED  NOT DETECTED Final   Rhinovirus / Enterovirus NOT DETECTED NOT DETECTED Final   Influenza A NOT DETECTED NOT DETECTED Final   Influenza B NOT DETECTED NOT DETECTED Final   Parainfluenza Virus 1 NOT DETECTED NOT DETECTED Final   Parainfluenza  Virus 2 NOT DETECTED NOT DETECTED Final   Parainfluenza Virus 3 NOT DETECTED NOT DETECTED Final   Parainfluenza Virus 4 NOT DETECTED NOT DETECTED Final   Respiratory Syncytial Virus NOT DETECTED NOT DETECTED Final   Bordetella pertussis NOT DETECTED NOT DETECTED Final   Bordetella Parapertussis NOT DETECTED NOT DETECTED Final   Chlamydophila pneumoniae NOT DETECTED NOT DETECTED Final   Mycoplasma pneumoniae NOT DETECTED NOT DETECTED Final    Comment: Performed at [facility], [place] [address] [place], [place] [address]  Expectorated Sputum Assessment w Gram Stain, Rflx to Resp Cult     Status: None   Collection Time: 02/05/22  5:32 PM   Specimen: Expectorated Sputum  Result Value Ref Range Status   Specimen Description EXPECTORATED SPUTUM  Final   Special Requests NONE  Final   Sputum evaluation   Final    THIS SPECIMEN IS ACCEPTABLE FOR SPUTUM CULTURE Performed at [facility], [place] [address], [place], [place] [address]    Report Status 02/05/2022 FINAL  Final  Culture, Respiratory w Gram Stain     Status: None (Preliminary result)   Collection Time: 02/05/22  5:32 PM  Result Value Ref Range Status   Specimen Description   Final    EXPECTORATED SPUTUM Performed at [facility] [address], [place], [place] [address]    Special Requests   Final    NONE Reflexed from [id number] Performed at [facility], [place] [address] [place], [place] [address]    Gram Stain   Final    NO WBC SEEN RARE BUDDING YEAST SEEN RARE YEAST WITH PSEUDOHYPHAE RARE GRAM POSITIVE COCCI    Culture   Final    TOO YOUNG TO READ Performed at [facility], [place] [address], [place], [place] [address]     Report Status PENDING  Incomplete         Radiology Studies: DG CHEST PORT 1 VIEW  Result Date: 02/05/2022 CLINICAL DATA:  Shortness of breath EXAM: PORTABLE CHEST 1 VIEW COMPARISON:  02/03/2022 FINDINGS: Surgical clips along the aortopulmonary window. Atherosclerotic calcification of the aortic arch. Stable scarring at the left lung base. Tapering of the peripheral pulmonary vasculature favors emphysema. Prior left upper lobectomy with stable scarring peripherally in the left lower lobe. IMPRESSION: 1. No acute findings. 2. Prior left upper lobectomy. 3. Stable scarring at the left lung base and peripherally in the left lower lobe. 4.  Emphysema (ICD10-J43.9). Electronically Signed   By: Van Clines M.D.   On: 02/05/2022 08:06        Scheduled Meds:  azithromycin  500 mg Oral Daily   DULoxetine  30 mg Oral Daily   guaiFENesin  1,200 mg Oral BID   ipratropium-albuterol  3 mL Nebulization TID   isosorbide mononitrate  15 mg Oral Daily   pantoprazole  40 mg Oral Daily   predniSONE  40 mg Oral Q breakfast   rosuvastatin  20 mg Oral Daily   saccharomyces boulardii  250 mg Oral BID   tamsulosin  0.4 mg Oral Daily   Continuous Infusions:  cefTRIAXone (ROCEPHIN)  IV 2 g (02/05/22 2039)     LOS: 2 days    Time spent: 35 minutes    Melizza Kanode Darleen Crocker, DO [facility]  If 7PM-7AM, please contact night-coverage [email or web address] 02/06/2022, 11:04 AM

## 2022-02-07 DIAGNOSIS — J441 Chronic obstructive pulmonary disease with (acute) exacerbation: Secondary | ICD-10-CM | POA: Diagnosis not present

## 2022-02-07 LAB — CBC
HCT: 38.8 % — ABNORMAL LOW (ref 39.0–52.0)
Hemoglobin: 12.5 g/dL — ABNORMAL LOW (ref 13.0–17.0)
MCH: 31.8 pg (ref 26.0–34.0)
MCHC: 32.2 g/dL (ref 30.0–36.0)
MCV: 98.7 fL (ref 80.0–100.0)
Platelets: 279 10*3/uL (ref 150–400)
RBC: 3.93 MIL/uL — ABNORMAL LOW (ref 4.22–5.81)
RDW: 14.4 % (ref 11.5–15.5)
WBC: 8.8 10*3/uL (ref 4.0–10.5)
nRBC: 0 % (ref 0.0–0.2)

## 2022-02-07 LAB — BASIC METABOLIC PANEL
Anion gap: 6 (ref 5–15)
BUN: 14 mg/dL (ref 8–23)
CO2: 24 mmol/L (ref 22–32)
Calcium: 8.3 mg/dL — ABNORMAL LOW (ref 8.9–10.3)
Chloride: 108 mmol/L (ref 98–111)
Creatinine, Ser: 1.05 mg/dL (ref 0.61–1.24)
GFR, Estimated: >60 mL/min (ref >60)
Glucose, Bld: 131 mg/dL — ABNORMAL HIGH (ref 70–99)
Potassium: 4.3 mmol/L (ref 3.5–5.1)
Sodium: 138 mmol/L (ref 135–145)

## 2022-02-07 LAB — C DIFFICILE (CDIFF) QUICK SCRN (NO PCR REFLEX)
C Diff antigen: NEGATIVE
C Diff interpretation: NOT DETECTED
C Diff toxin: NEGATIVE

## 2022-02-07 LAB — MAGNESIUM: Magnesium: 1.9 mg/dL (ref 1.7–2.4)

## 2022-02-07 MED ORDER — PANTOPRAZOLE SODIUM 40 MG PO TBEC
40.0000 mg | DELAYED_RELEASE_TABLET | Freq: Two times a day (BID) | ORAL | Status: DC
  Administered 2022-02-07 – 2022-02-08 (×2): 40 mg via ORAL
  Filled 2022-02-07 (×2): qty 1

## 2022-02-07 MED ORDER — ALUM & MAG HYDROXIDE-SIMETH 200-200-20 MG/5ML PO SUSP
30.0000 mL | ORAL | Status: DC | PRN
  Administered 2022-02-07 – 2022-02-08 (×3): 30 mL via ORAL
  Filled 2022-02-07 (×4): qty 30

## 2022-02-07 NOTE — TOC Initial Note (Signed)
Transition of Care [facility]) - Initial/Assessment Note    Patient Details  Name: [name] MRN: [id number] Date of Birth: 30-Dec-1955  Transition of Care [facility]) CM/SW Contact:    Leeroy Cha, RN Phone Number: 02/07/2022, 7:55 AM  Clinical Narrative:                  Transition of Care [facility]) Screening Note   Patient Details  Name: [name] Date of Birth: 11-Oct-1955   Transition of Care [facility]) CM/SW Contact:    Leeroy Cha, RN Phone Number: 02/07/2022, 7:55 AM    Transition of Care Department [facility]) has reviewed patient and no TOC needs have been identified at this time. We will continue to monitor patient advancement through interdisciplinary progression rounds. If new patient transition needs arise, please place a TOC consult.    Expected Discharge Plan: Home/Self Care Barriers to Discharge: Continued Medical Work up   Patient Goals and CMS Choice Patient states their goals for this hospitalization and ongoing recovery are:: to go home CMS Medicare.gov Compare Post Acute Care list provided to:: Patient        Expected Discharge Plan and Services   Discharge Planning Services: CM Consult   Living arrangements for the past 2 months: Single Family Home                                      Prior Living Arrangements/Services Living arrangements for the past 2 months: Single Family Home Lives with:: Self Patient language and need for interpreter reviewed:: Yes Do you feel safe going back to the place where you live?: Yes            Criminal Activity/Legal Involvement Pertinent to Current Situation/Hospitalization: No - Comment as needed  Activities of Daily Living Home Assistive Devices/Equipment: Dentures (specify type), Oxygen, Walker (specify type), Cane (specify quad or straight) ADL Screening (condition at time of admission) Patient's cognitive ability adequate to safely complete daily activities?: Yes Is the patient deaf or have  difficulty hearing?: No Does the patient have difficulty seeing, even when wearing glasses/contacts?: No Does the patient have difficulty concentrating, remembering, or making decisions?: No Patient able to express need for assistance with ADLs?: No Does the patient have difficulty dressing or bathing?: No Independently performs ADLs?: Yes (appropriate for developmental age) Does the patient have difficulty walking or climbing stairs?: Yes Weakness of Legs: Both Weakness of Arms/Hands: None  Permission Sought/Granted                  Emotional Assessment Appearance:: Appears stated age Attitude/Demeanor/Rapport: Engaged Affect (typically observed): Angry Orientation: : Oriented to Self, Oriented to Place, Oriented to  Time, Oriented to Situation Alcohol / Substance Use: Tobacco Use (quit two years ago) Psych Involvement: No (comment)  Admission diagnosis:  Bronchitis [J40] COPD exacerbation (HCC) [J44.1] COPD with acute exacerbation (HCC) [J44.1] Hemoptysis [R04.2] Community acquired pneumonia, unspecified laterality [J18.9] Patient Active Problem List   Diagnosis Date Noted   COPD with acute exacerbation ([place]) 02/03/2022   Community acquired pneumonia 02/03/2022   Hyponatremia 02/03/2022   Irritable bowel syndrome with diarrhea 11/21/2021   Small intestinal bacterial overgrowth (SIBO) 11/21/2021   Need for lipid screening 10/14/2021   Aftercare for long-term (current) use of antibiotics 08/28/2021   Nodule of left lung 07/10/2021   Acute kidney injury ([place]) 05/03/2021   Anxiety 05/01/2021   Hyperbilirubinemia 05/01/2021  Cramping of hands 05/01/2021   Physical deconditioning 04/16/2021   Insomnia 03/28/2021   Change in bowel habits 12/14/2020   Abdominal pain 12/14/2020   Generalized abdominal pain 12/14/2020   Chronic diarrhea 12/14/2020   Globus sensation 12/14/2020   Frequent bowel movements 12/13/2020   Dyspnea on exertion 12/02/2020   Bilateral lower  extremity edema 12/02/2020   Hypertension 12/02/2020   Aortic atherosclerosis ([place]) 11/22/2020   Chronic respiratory failure with hypoxia ([place]) 08/17/2020   Dysphagia 05/22/2020   Polypharmacy 04/27/2020   Chronic pain syndrome 12/13/2019   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   Renal mass 06/01/2019   Nocturnal hypoxemia 01/27/2019   Laryngopharyngeal reflux (LPR) 02/13/2017   GERD (gastroesophageal reflux disease) 02/09/2017   Allergic rhinitis 08/12/2016   Nodule of right lung 06/27/2015   Chest pain 04/16/2015   Spondylosis, cervical, 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   Hemoptysis 12/29/2012   Lung cancer ([place]) 07/03/2011   CAD (coronary artery disease) 06/05/2011   Lung mass 05/13/2011   Chronic headaches 04/08/2011   Abnormal CT of the chest 04/08/2011   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   PCP:  Lacinda Axon, MD Pharmacy:   [place] ([place]), [place] - [address] [place] ([place]) [place] [address] Phone: [phone] Fax: [facility] #[id number] [address] [place], [place] [address] [place] [address] [place] [address] Phone: [phone] Fax: [phone]     Social Determinants of Health (SDOH) Social History: SDOH Screenings   Food Insecurity: No Food Insecurity (02/05/2022)  Housing: Low Risk  (02/05/2022)  Transportation Needs: Unmet Transportation Needs (02/05/2022)  Utilities: Not At Risk (02/05/2022)  Alcohol Screen: Low Risk  (08/20/2021)  Depression (PHQ2-9): High Risk (08/20/2021)  Financial Resource Strain: Low Risk  (08/20/2021)  Social Connections: Unknown (08/20/2021)  Stress: Stress Concern Present (08/20/2021)  Tobacco Use: Medium Risk (02/03/2022)   SDOH Interventions: Housing Interventions: Intervention Not Indicated   Readmission Risk Interventions   No data to display

## 2022-02-07 NOTE — Plan of Care (Signed)
  Problem: Education: Goal: Knowledge of disease or condition will improve Outcome: Progressing Goal: Knowledge of the prescribed therapeutic regimen will improve Outcome: Progressing   Problem: Activity: Goal: Ability to tolerate increased activity will improve Outcome: Progressing Goal: Will verbalize the importance of balancing activity with adequate rest periods Outcome: Progressing   Problem: Respiratory: Goal: Ability to maintain a clear airway will improve Outcome: Progressing Goal: Levels of oxygenation will improve Outcome: Progressing Goal: Ability to maintain adequate ventilation will improve Outcome: Progressing   Problem: Activity: Goal: Ability to tolerate increased activity will improve Outcome: Progressing   Problem: Clinical Measurements: Goal: Ability to maintain a body temperature in the normal range will improve Outcome: Progressing   Problem: Respiratory: Goal: Ability to maintain adequate ventilation will improve Outcome: Progressing Goal: Ability to maintain a clear airway will improve Outcome: Progressing   Problem: Pain Managment: Goal: General experience of comfort will improve Outcome: Progressing   Problem: Safety: Goal: Ability to remain free from injury will improve Outcome: Progressing   Problem: Skin Integrity: Goal: Risk for impaired skin integrity will decrease Outcome: Progressing

## 2022-02-07 NOTE — Care Management Important Message (Signed)
Important Message  Patient Details IM Letter given. Name: [name] MRN: [id number] Date of Birth: 11-Apr-1955   Medicare Important Message Given:  Yes     Kerin Salen 02/07/2022, 10:14 AM

## 2022-02-07 NOTE — TOC Initial Note (Signed)
Transition of Care [facility]) - Initial/Assessment Note    Patient Details  Name: [name] MRN: [id number] Date of Birth: Jul 04, 1955  Transition of Care [facility]) CM/SW Contact:    Henrietta Dine, RN Phone Number: 02/07/2022, 4:07 PM  Clinical Narrative:                 TOC for d/c planning; pt says he is from home and plans to return at d/c; the pt denies IPV, food insecurity, and problems paying utilities; he says he has transportation; the pt identified his [place], [place] [phone]; the pt says he has reading glasses, dentures (upper/lower); he also has a manual WC, RW, cane, shower chair/bars; the pt says he does not have [place] services; he has home oxygen w/ [employer] and a travel tank that is full; notified Caryl Pina at [place] of pt status; TOC will follow for d/c needs.  Expected Discharge Plan: Home/Self Care Barriers to Discharge: Continued Medical Work up   Patient Goals and CMS Choice Patient states their goals for this hospitalization and ongoing recovery are:: home CMS Medicare.gov Compare Post Acute Care list provided to:: Patient        Expected Discharge Plan and Services   Discharge Planning Services: CM Consult   Living arrangements for the past 2 months: Apartment                                      Prior Living Arrangements/Services Living arrangements for the past 2 months: Apartment Lives with:: Self Patient language and need for interpreter reviewed:: Yes Do you feel safe going back to the place where you live?: Yes      Need for Family Participation in Patient Care: Yes (Comment) Care giver support system in place?: Yes (comment) Current home services: DME (home oxygen w/ [employer], manual wheel chair, shower chair) Criminal Activity/Legal Involvement Pertinent to Current Situation/Hospitalization: No - Comment as needed  Activities of Daily Living Home Assistive Devices/Equipment: Dentures (specify type), Oxygen, Walker (specify type),  Cane (specify quad or straight) ADL Screening (condition at time of admission) Patient's cognitive ability adequate to safely complete daily activities?: Yes Is the patient deaf or have difficulty hearing?: No Does the patient have difficulty seeing, even when wearing glasses/contacts?: No Does the patient have difficulty concentrating, remembering, or making decisions?: No Patient able to express need for assistance with ADLs?: No Does the patient have difficulty dressing or bathing?: No Independently performs ADLs?: Yes (appropriate for developmental age) Does the patient have difficulty walking or climbing stairs?: Yes Weakness of Legs: Both Weakness of Arms/Hands: None  Permission Sought/Granted Permission sought to share information with : Case Manager Permission granted to share information with : Yes, Verbal Permission Granted  Share Information with NAME: Lenor Coffin, RN, CM           Emotional Assessment Appearance:: Appears stated age Attitude/Demeanor/Rapport: Gracious Affect (typically observed): Accepting Orientation: : Oriented to Self, Oriented to Place, Oriented to  Time, Oriented to Situation Alcohol / Substance Use: Not Applicable Psych Involvement: No (comment)  Admission diagnosis:  Bronchitis [J40] COPD exacerbation (HCC) [J44.1] COPD with acute exacerbation (HCC) [J44.1] Hemoptysis [R04.2] Community acquired pneumonia, unspecified laterality [J18.9] Patient Active Problem List   Diagnosis Date Noted   COPD with acute exacerbation ([place]) 02/03/2022   Community acquired pneumonia 02/03/2022   Hyponatremia 02/03/2022   Irritable bowel syndrome with diarrhea 11/21/2021   Small intestinal  bacterial overgrowth (SIBO) 11/21/2021   Need for lipid screening 10/14/2021   Aftercare for long-term (current) use of antibiotics 08/28/2021   Nodule of left lung 07/10/2021   Acute kidney injury ([place]) 05/03/2021   Anxiety 05/01/2021   Hyperbilirubinemia 05/01/2021    Cramping of hands 05/01/2021   Physical deconditioning 04/16/2021   Insomnia 03/28/2021   Change in bowel habits 12/14/2020   Abdominal pain 12/14/2020   Generalized abdominal pain 12/14/2020   Chronic diarrhea 12/14/2020   Globus sensation 12/14/2020   Frequent bowel movements 12/13/2020   Dyspnea on exertion 12/02/2020   Bilateral lower extremity edema 12/02/2020   Hypertension 12/02/2020   Aortic atherosclerosis ([place]) 11/22/2020   Chronic respiratory failure with hypoxia ([place]) 08/17/2020   Dysphagia 05/22/2020   Polypharmacy 04/27/2020   Chronic pain syndrome 12/13/2019   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   Renal mass 06/01/2019   Nocturnal hypoxemia 01/27/2019   Laryngopharyngeal reflux (LPR) 02/13/2017   GERD (gastroesophageal reflux disease) 02/09/2017   Allergic rhinitis 08/12/2016   Nodule of right lung 06/27/2015   Chest pain 04/16/2015   Spondylosis, cervical, 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   Hemoptysis 12/29/2012   Lung cancer ([place]) 07/03/2011   CAD (coronary artery disease) 06/05/2011   Lung mass 05/13/2011   Chronic headaches 04/08/2011   Abnormal CT of the chest 04/08/2011   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   PCP:  Lacinda Axon, MD Pharmacy:   [place] ([place]), [place] - [address] [place] ([place]) [place] [address] Phone: [phone] Fax: [place] #[id number] [address] [place], [place] - [address] [address] [place] [address] Phone: [phone] Fax: [phone]     Social Determinants of Health (SDOH) Social History: SDOH Screenings   Food Insecurity: No Food Insecurity (02/05/2022)  Housing: Low Risk  (02/05/2022)  Transportation Needs: Unmet Transportation Needs (02/05/2022)  Utilities: Not At Risk (02/05/2022)  Alcohol Screen: Low Risk  (08/20/2021)  Depression (PHQ2-9): High Risk (08/20/2021)  Financial  Resource Strain: Low Risk  (08/20/2021)  Social Connections: Unknown (08/20/2021)  Stress: Stress Concern Present (08/20/2021)  Tobacco Use: Medium Risk (02/03/2022)   SDOH Interventions: Housing Interventions: Intervention Not Indicated   Readmission Risk Interventions     No data to display

## 2022-02-07 NOTE — Plan of Care (Signed)

## 2022-02-07 NOTE — Progress Notes (Signed)
PROGRESS NOTE    ZANDER INGHAM  GGY:[id number] DOB: 06-19-1955 DOA: 02/03/2022 PCP: Lacinda Axon, MD   Brief Narrative:    HPI per Dr. Mitzi Hansen on 02/03/22 [name] is a pleasant 67 y.o. male with medical history significant for adenocarcinoma of the lung status postresection, renal cell carcinoma status post partial left nephrectomy, and COPD with chronic hypoxic respiratory failure, now presenting to the emergency department with cough, shortness of breath, fatigue, and bloody sputum.   Patient reports that he began to feel poorly the night of 01/29/2022 and the following day was experiencing increased shortness of breath, general malaise, chills, and blood-streaked sputum.  He was seen in the emergency department on 01/30/2022 where he had CT chest that was negative for PE but concerning for pneumonia.  He was started on Augmentin and azithromycin his pulmonologist called in a prednisone taper the following day.  Despite this, he reports that his condition has not improved at all and his hemoptysis has increased.   ED Course: Upon arrival to the ED, patient is found to be afebrile and saturating well on his usual 2 L/min of supplemental oxygen with normal heart rate and stable blood pressure.  Chest x-ray demonstrates emphysematous changes and minimal residual airspace opacity at the bases.  COVID, influenza, and RSV PCR is negative.  Labs notable for mild hyponatremia.   Blood cultures were collected in the ED and the patient was given 125 mg IV Solu-Medrol, 500 mL of saline, fentanyl, and multiple neb treatments.   **Interim History  No further bloody sputum noted today and is doing better from respiratory standpoint.  He still has very poor appetite and is starting to complain of some worsening diarrhea.  GI pathogen panel with no noted growth.  He continues to have ongoing diarrhea as well as abdominal pain and states he is not eating very well.  Assessment & Plan:    Principal Problem:   COPD with acute exacerbation ([place]) Active Problems:   Hemoptysis   Chronic respiratory failure with hypoxia ([place])   Community acquired pneumonia   Hyponatremia  Assessment and Plan:   COPD with acute exacerbation with Associated pneumonia-improved Acute on chronic hypoxic respiratory failure-now back to baseline 2 L nasal cannula -Continuous Pulse Oximetry and Maintain O2 Saturations >90% -Continue Supplemental O2 via  and Wean O2 as Tolerated -Will need an Ambulatory Home O2 Screen prior to D/C  SpO2: 96 % O2 Flow Rate (L/min): 2 L/min -Failed to improve with outpatient antibiotic and prednisone  -Treated with 125 mg IV Solu-Medrol and multiple neb treatments in ED, now on oral prednisone -Culture sputum, check strep pneumo and legionella antigens,  -Was getting IVF hydration with NS at 100 mL/hr and will change to 75 mL/hr x1 day -Started Rocephin and azithromycin and will continue -Trend Procalcitonin and went from 0.17 -> <0.10 -Schedule SAMA-SABA and use additional SABA as-needed  with Nebs Duonebs -C/w Flutter Valve, Incentive Spirometry, and Guaifenesin 1200 mg po BID  -May need to repeat imaging -C/w Antitussives  -Check MRSA, respiratory virus panel -Pulmonology to follow-up outpatient and plan to discharge on Augmentin for total course of 4 weeks.   Hemoptysis-resolved -Cough has been productive of blood-streaked sputum and small clots for ~1 wk but now states is progressively getting worse and it is now quarter size -No PE seen on CTA from 12/28, but pneumonia noted  -Continue antibiotics and supportive care -Appears resolved at this point, continue to monitor   Diarrhea-persistent -GI  path panel negative -Avoid C. difficile study at this time -Imodium ordered as needed   Normocytic Anemia-stable -Continue to Monitor for S/Sx of Bleeding; No overt bleeding noted   Hx of Adenocarcinoma of the Lung s/p Resection Hx of Renal Cell  Carcinoma s/p Partial Left Nephrectomy -Follow up with outpatient specialists    GERD/GI prophylaxis -Continue with Pantoprazole 40 mg p.o. daily   Hyperlipidemia -Continue Rosuvastatin 20 g p.o. daily   DVT prophylaxis: SCDs Start: 02/03/22 2256     Code Status: Full Code Family Communication: No family present at bedside    Disposition Plan:  Level of care: Med-Surg Status is: Inpatient Remains inpatient appropriate because: Needs further clinical improvement and if not improving will need Pulmonary Consultation    Consultants:  Pulmonary    Procedures:  As delineated as above   Antimicrobials:  Anti-infectives (From admission, onward)    Start     Dose/Rate Route Frequency Ordered Stop   02/04/22 1000  azithromycin (ZITHROMAX) tablet 500 mg        500 mg Oral Daily 02/03/22 2258 02/09/22 0959   02/03/22 2300  cefTRIAXone (ROCEPHIN) 2 g in sodium chloride 0.9 % 100 mL IVPB        2 g 200 mL/hr over 30 Minutes Intravenous Every 24 hours 02/03/22 2258 02/08/22 2259       Subjective: Patient seen and evaluated today with no improvement in his symptoms of diarrhea or abdominal pain.  He continues to have significant cramping as well as nausea, but denies any vomiting.  He denies any further hemoptysis and has poor appetite.  He states that he is not ready to go home at this time.  Objective: Vitals:   02/06/22 1924 02/06/22 2214 02/07/22 0539 02/07/22 0859  BP:  128/84 124/87   Pulse:  73 62 (!) 53  Resp:  18 20   Temp:  98 F (36.7 C) 97.9 F (36.6 C)   TempSrc:      SpO2: 92% 91% (!) 88% 98%  Weight:      Height:        Intake/Output Summary (Last 24 hours) at 02/07/2022 1014 Last data filed at 02/07/2022 0944 Gross per 24 hour  Intake 694 ml  Output --  Net 694 ml   Filed Weights   02/03/22 2036 02/04/22 2000 02/04/22 2013  Weight: 72.6 kg 71.1 kg 71.1 kg    Examination:  General exam: Appears calm and comfortable  Respiratory system: Clear to  auscultation. Respiratory effort normal.  2 L nasal cannula oxygen Cardiovascular system: S1 & S2 heard, RRR.  Gastrointestinal system: Abdomen is soft, tender to palpation throughout Central nervous system: Alert and awake Extremities: No edema Skin: No significant lesions noted Psychiatry: Flat affect.    Data Reviewed: I have personally reviewed following labs and imaging studies  CBC: Recent Labs  Lab 02/03/22 1918 02/04/22 0418 02/05/22 0402 02/06/22 0343 02/07/22 0357  WBC 7.1 5.5 12.4* 11.6* 8.8  NEUTROABS 6.2  --  11.2*  --   --   HGB 14.4 12.4* 12.9* 12.0* 12.5*  HCT 43.9 37.7* 40.4 37.5* 38.8*  MCV 95.0 95.2 97.6 97.2 98.7  PLT 269 251 282 264 256    Basic Metabolic Panel: Recent Labs  Lab 02/03/22 1918 02/04/22 0418 02/05/22 0402 02/06/22 0343 02/07/22 0357  NA 132* 135 135 136 138  K 4.3 4.0 4.6 4.0 4.3  CL 101 107 103 106 108  CO2 20* 21* 22 24 24   GLUCOSE 99  177* 138* 104* 131*  BUN 9 9 10 13 14   CREATININE 1.07 0.99 1.05 1.03 1.05  CALCIUM 8.9 8.3* 8.7* 8.4* 8.3*  MG  --  1.8 1.9 2.0 1.9  PHOS  --   --  3.3  --   --     GFR: Estimated Creatinine Clearance: 69.2 mL/min (by C-G formula based on SCr of 1.05 mg/dL). Liver Function Tests: Recent Labs  Lab 02/03/22 1918 02/05/22 0402  AST 16 22  ALT 16 15  ALKPHOS 63 53  BILITOT 0.6 0.4  PROT 7.2 6.4*  ALBUMIN 3.9 3.0*    No results for input(s): "LIPASE", "AMYLASE" in the last 168 hours. No results for input(s): "AMMONIA" in the last 168 hours. Coagulation Profile: Recent Labs  Lab 02/05/22 0402  INR 1.0    Cardiac Enzymes: No results for input(s): "CKTOTAL", "CKMB", "CKMBINDEX", "TROPONINI" in the last 168 hours. BNP (last 3 results) No results for input(s): "PROBNP" in the last 8760 hours. HbA1C: No results for input(s): "HGBA1C" in the last 72 hours. CBG: No results for input(s): "GLUCAP" in the last 168 hours. Lipid Profile: No results for input(s): "CHOL", "HDL",  "LDLCALC", "TRIG", "CHOLHDL", "LDLDIRECT" in the last 72 hours. Thyroid Function Tests: No results for input(s): "TSH", "T4TOTAL", "FREET4", "T3FREE", "THYROIDAB" in the last 72 hours. Anemia Panel: No results for input(s): "VITAMINB12", "FOLATE", "FERRITIN", "TIBC", "IRON", "RETICCTPCT" in the last 72 hours. Sepsis Labs: Recent Labs  Lab 02/03/22 1918 02/03/22 1928 02/04/22 0418 02/05/22 0402  PROCALCITON  --  0.17 <0.10 <0.10  LATICACIDVEN 1.1  --   --   --      Recent Results (from the past 240 hour(s))  Resp panel by RT-PCR (RSV, Flu A&B, Covid) Anterior Nasal Swab     Status: None   Collection Time: 01/30/22  3:30 PM   Specimen: Anterior Nasal Swab  Result Value Ref Range Status   SARS Coronavirus 2 by RT PCR NEGATIVE NEGATIVE Final    Comment: (NOTE) SARS-CoV-2 target nucleic acids are NOT DETECTED.  The SARS-CoV-2 RNA is generally detectable in upper respiratory specimens during the acute phase of infection. The lowest concentration of SARS-CoV-2 viral copies this assay can detect is 138 copies/mL. A negative result does not preclude SARS-Cov-2 infection and should not be used as the sole basis for treatment or other patient management decisions. A negative result may occur with  improper specimen collection/handling, submission of specimen other than nasopharyngeal swab, presence of viral mutation(s) within the areas targeted by this assay, and inadequate number of viral copies(<138 copies/mL). A negative result must be combined with clinical observations, patient history, and epidemiological information. The expected result is Negative.  Fact Sheet for Patients:  EntrepreneurPulse.com.au  Fact Sheet for Healthcare Providers:  IncredibleEmployment.be  This test is no t yet approved or cleared by the [place] FDA and  has been authorized for detection and/or diagnosis of SARS-CoV-2 by FDA under an Emergency Use  Authorization (EUA). This EUA will remain  in effect (meaning this test can be used) for the duration of the COVID-19 declaration under Section 564(b)(1) of the Act, 21 U.S.C.section 360bbb-3(b)(1), unless the authorization is terminated  or revoked sooner.       Influenza A by PCR NEGATIVE NEGATIVE Final   Influenza B by PCR NEGATIVE NEGATIVE Final    Comment: (NOTE) The Xpert Xpress SARS-CoV-2/FLU/RSV plus assay is intended as an aid in the diagnosis of influenza from Nasopharyngeal swab specimens and should not be used as a  sole basis for treatment. Nasal washings and aspirates are unacceptable for Xpert Xpress SARS-CoV-2/FLU/RSV testing.  Fact Sheet for Patients: EntrepreneurPulse.com.au  Fact Sheet for Healthcare Providers: IncredibleEmployment.be  This test is not yet approved or cleared by the [place] FDA and has been authorized for detection and/or diagnosis of SARS-CoV-2 by FDA under an Emergency Use Authorization (EUA). This EUA will remain in effect (meaning this test can be used) for the duration of the COVID-19 declaration under Section 564(b)(1) of the Act, 21 U.S.C. section 360bbb-3(b)(1), unless the authorization is terminated or revoked.     Resp Syncytial Virus by PCR NEGATIVE NEGATIVE Final    Comment: (NOTE) Fact Sheet for Patients: EntrepreneurPulse.com.au  Fact Sheet for Healthcare Providers: IncredibleEmployment.be  This test is not yet approved or cleared by the [place] FDA and has been authorized for detection and/or diagnosis of SARS-CoV-2 by FDA under an Emergency Use Authorization (EUA). This EUA will remain in effect (meaning this test can be used) for the duration of the COVID-19 declaration under Section 564(b)(1) of the Act, 21 U.S.C. section 360bbb-3(b)(1), unless the authorization is terminated or revoked.  Performed at [facility], [address] [place], [place] [address]   Culture, blood (routine x 2)     Status: None (Preliminary result)   Collection Time: 02/03/22  7:18 PM   Specimen: BLOOD LEFT ARM  Result Value Ref Range Status   Specimen Description   Final    BLOOD LEFT ARM Performed at [facility] [address] [place], [place] [address]    Special Requests   Final    BOTTLES DRAWN AEROBIC AND ANAEROBIC Blood Culture adequate volume Performed at [facility] [address] [place], [place] [address]    Culture   Final    NO GROWTH 3 DAYS Performed at [facility], [place] [address] [place], [place] [address]    Report Status PENDING  Incomplete  Culture, blood (routine x 2)     Status: None (Preliminary result)   Collection Time: 02/03/22  7:18 PM   Specimen: BLOOD LEFT ARM  Result Value Ref Range Status   Specimen Description   Final    BLOOD LEFT ARM Performed at [facility], [place] [address], [place], [place] [address]    Special Requests   Final    BOTTLES DRAWN AEROBIC AND ANAEROBIC Blood Culture adequate volume Performed at [facility] [address], [place], [place] [address]    Culture   Final    NO GROWTH 3 DAYS Performed at [facility], [place] [address], [place],  [address]    Report Status PENDING  Incomplete  Resp panel by RT-PCR (RSV, Flu A&B, Covid) Anterior Nasal Swab     Status: None   Collection Time: 02/03/22  8:49 PM   Specimen: Anterior Nasal Swab  Result Value Ref Range Status   SARS Coronavirus 2 by RT PCR NEGATIVE NEGATIVE Final    Comment: (NOTE) SARS-CoV-2 target nucleic acids are NOT DETECTED.  The SARS-CoV-2 RNA is generally detectable in upper respiratory specimens during the acute phase of infection. The lowest concentration of SARS-CoV-2 viral copies this assay can detect is 138 copies/mL. A negative result does not preclude SARS-Cov-2 infection and should not be used as the sole basis  for treatment or other patient management decisions. A negative result may occur with  improper specimen collection/handling, submission of specimen other than nasopharyngeal swab, presence of  viral mutation(s) within the areas targeted by this assay, and inadequate number of viral copies(<138 copies/mL). A negative result must be combined with clinical observations, patient history, and epidemiological information. The expected result is Negative.  Fact Sheet for Patients:  EntrepreneurPulse.com.au  Fact Sheet for Healthcare Providers:  IncredibleEmployment.be  This test is no t yet approved or cleared by the [place] FDA and  has been authorized for detection and/or diagnosis of SARS-CoV-2 by FDA under an Emergency Use Authorization (EUA). This EUA will remain  in effect (meaning this test can be used) for the duration of the COVID-19 declaration under Section 564(b)(1) of the Act, 21 U.S.C.section 360bbb-3(b)(1), unless the authorization is terminated  or revoked sooner.       Influenza A by PCR NEGATIVE NEGATIVE Final   Influenza B by PCR NEGATIVE NEGATIVE Final    Comment: (NOTE) The Xpert Xpress SARS-CoV-2/FLU/RSV plus assay is intended as an aid in the diagnosis of influenza from Nasopharyngeal swab specimens and should not be used as a sole basis for treatment. Nasal washings and aspirates are unacceptable for Xpert Xpress SARS-CoV-2/FLU/RSV testing.  Fact Sheet for Patients: EntrepreneurPulse.com.au  Fact Sheet for Healthcare Providers: IncredibleEmployment.be  This test is not yet approved or cleared by the [place] FDA and has been authorized for detection and/or diagnosis of SARS-CoV-2 by FDA under an Emergency Use Authorization (EUA). This EUA will remain in effect (meaning this test can be used) for the duration of the COVID-19 declaration under Section 564(b)(1) of the Act, 21  U.S.C. section 360bbb-3(b)(1), unless the authorization is terminated or revoked.     Resp Syncytial Virus by PCR NEGATIVE NEGATIVE Final    Comment: (NOTE) Fact Sheet for Patients: EntrepreneurPulse.com.au  Fact Sheet for Healthcare Providers: IncredibleEmployment.be  This test is not yet approved or cleared by the [place] FDA and has been authorized for detection and/or diagnosis of SARS-CoV-2 by FDA under an Emergency Use Authorization (EUA). This EUA will remain in effect (meaning this test can be used) for the duration of the COVID-19 declaration under Section 564(b)(1) of the Act, 21 U.S.C. section 360bbb-3(b)(1), unless the authorization is terminated or revoked.  Performed at [facility], [place] [address] [place], [place] [address]   MRSA Next Gen by PCR, Nasal     Status: None   Collection Time: 02/04/22  5:32 PM   Specimen: Nasal Mucosa; Nasal Swab  Result Value Ref Range Status   MRSA by PCR Next Gen NOT DETECTED NOT DETECTED Final    Comment: (NOTE) The GeneXpert MRSA Assay (FDA approved for NASAL specimens only), is one component of a comprehensive MRSA colonization surveillance program. It is not intended to diagnose MRSA infection nor to guide or monitor treatment for MRSA infections. Test performance is not FDA approved in patients less than 54 years old. Performed at [facility], [place] [address] [place], [place] [address]   Respiratory (~20 pathogens) panel by PCR     Status: None   Collection Time: 02/04/22  5:32 PM   Specimen: Nasal Mucosa; Respiratory  Result Value Ref Range Status   Adenovirus NOT DETECTED NOT DETECTED Final   Coronavirus 229E NOT DETECTED NOT DETECTED Final    Comment: (NOTE) The Coronavirus on the Respiratory Panel, DOES NOT test for the novel  Coronavirus (2019 nCoV)    Coronavirus HKU1 NOT DETECTED NOT DETECTED Final   Coronavirus NL63 NOT DETECTED  NOT DETECTED Final   Coronavirus OC43 NOT DETECTED NOT DETECTED Final  Metapneumovirus NOT DETECTED NOT DETECTED Final   Rhinovirus / Enterovirus NOT DETECTED NOT DETECTED Final   Influenza A NOT DETECTED NOT DETECTED Final   Influenza B NOT DETECTED NOT DETECTED Final   Parainfluenza Virus 1 NOT DETECTED NOT DETECTED Final   Parainfluenza Virus 2 NOT DETECTED NOT DETECTED Final   Parainfluenza Virus 3 NOT DETECTED NOT DETECTED Final   Parainfluenza Virus 4 NOT DETECTED NOT DETECTED Final   Respiratory Syncytial Virus NOT DETECTED NOT DETECTED Final   Bordetella pertussis NOT DETECTED NOT DETECTED Final   Bordetella Parapertussis NOT DETECTED NOT DETECTED Final   Chlamydophila pneumoniae NOT DETECTED NOT DETECTED Final   Mycoplasma pneumoniae NOT DETECTED NOT DETECTED Final    Comment: Performed at [facility], [place] [address], [place], [place] [address]  Gastrointestinal Panel by PCR , Stool     Status: None   Collection Time: 02/05/22 11:38 AM   Specimen: Stool  Result Value Ref Range Status   Campylobacter species NOT DETECTED NOT DETECTED Final   Plesimonas shigelloides NOT DETECTED NOT DETECTED Final   Salmonella species NOT DETECTED NOT DETECTED Final   Yersinia enterocolitica NOT DETECTED NOT DETECTED Final   Vibrio species NOT DETECTED NOT DETECTED Final   Vibrio cholerae NOT DETECTED NOT DETECTED Final   Enteroaggregative E coli (EAEC) NOT DETECTED NOT DETECTED Final   Enteropathogenic E coli (EPEC) NOT DETECTED NOT DETECTED Final   Enterotoxigenic E coli (ETEC) NOT DETECTED NOT DETECTED Final   Shiga like toxin producing E coli (STEC) NOT DETECTED NOT DETECTED Final   Shigella/Enteroinvasive E coli (EIEC) NOT DETECTED NOT DETECTED Final   Cryptosporidium NOT DETECTED NOT DETECTED Final   Cyclospora cayetanensis NOT DETECTED NOT DETECTED Final   Entamoeba histolytica NOT DETECTED NOT DETECTED Final   Giardia lamblia NOT DETECTED NOT DETECTED Final   Adenovirus  F40/41 NOT DETECTED NOT DETECTED Final   Astrovirus NOT DETECTED NOT DETECTED Final   Norovirus GI/GII NOT DETECTED NOT DETECTED Final   Rotavirus A NOT DETECTED NOT DETECTED Final   Sapovirus (I, II, IV, and V) NOT DETECTED NOT DETECTED Final    Comment: Performed at [facility], [place] [address] [place], [place] [address]  Expectorated Sputum Assessment w Gram Stain, Rflx to Resp Cult     Status: None   Collection Time: 02/05/22  5:32 PM   Specimen: Expectorated Sputum  Result Value Ref Range Status   Specimen Description EXPECTORATED SPUTUM  Final   Special Requests NONE  Final   Sputum evaluation   Final    THIS SPECIMEN IS ACCEPTABLE FOR SPUTUM CULTURE Performed at [facility], [place] [address], [place], [place] [address]    Report Status 02/05/2022 FINAL  Final  Culture, Respiratory w Gram Stain     Status: None (Preliminary result)   Collection Time: 02/05/22  5:32 PM  Result Value Ref Range Status   Specimen Description   Final    EXPECTORATED SPUTUM Performed at [facility] [address] [place], [place] [address]    Special Requests   Final    NONE Reflexed from [id number] Performed at [facility], [place] [address] [place], [place] [address]    Gram Stain   Final    NO WBC SEEN RARE BUDDING YEAST SEEN RARE YEAST WITH PSEUDOHYPHAE RARE GRAM POSITIVE COCCI    Culture   Final    TOO YOUNG TO READ Performed at [facility], [place] [address],  [place], [place] [address]    Report Status PENDING  Incomplete         Radiology Studies: No results found.      Scheduled Meds:  azithromycin  500 mg Oral Daily   DULoxetine  30 mg Oral Daily   guaiFENesin  1,200 mg Oral BID   ipratropium-albuterol  3 mL Nebulization BID   isosorbide mononitrate  15 mg Oral Daily   pantoprazole  40 mg Oral Daily   predniSONE  40 mg Oral Q breakfast   rosuvastatin  20 mg Oral Daily   saccharomyces boulardii   250 mg Oral BID   tamsulosin  0.4 mg Oral Daily   Continuous Infusions:  cefTRIAXone (ROCEPHIN)  IV 2 g (02/06/22 2118)     LOS: 3 days    Time spent: 35 minutes    Dustin Burrill Darleen Crocker, DO [facility]  If 7PM-7AM, please contact night-coverage [email or web address] 02/07/2022, 10:14 AM

## 2022-02-08 DIAGNOSIS — J441 Chronic obstructive pulmonary disease with (acute) exacerbation: Secondary | ICD-10-CM | POA: Diagnosis not present

## 2022-02-08 MED ORDER — AMOXICILLIN-POT CLAVULANATE 875-125 MG PO TABS: 1.0000 | ORAL_TABLET | Freq: Two times a day (BID) | ORAL | 0 refills | Status: DC

## 2022-02-08 MED ORDER — SACCHAROMYCES BOULARDII 250 MG PO CAPS: 250.0000 mg | ORAL_CAPSULE | Freq: Two times a day (BID) | ORAL | 0 refills | Status: AC

## 2022-02-08 MED ORDER — LOPERAMIDE HCL 2 MG PO CAPS: 2.0000 mg | ORAL_CAPSULE | ORAL | 0 refills | Status: DC | PRN

## 2022-02-08 NOTE — TOC Transition Note (Signed)
Transition of Care [facility]) - CM/SW Discharge Note   Patient Details  Name: [name] MRN: [id number] Date of Birth: 05/02/1955  [facility] CM/SW Contact:  Henrietta Dine, RN Phone Number: 02/08/2022, 11:26 AM   Clinical Narrative:   Notified by Deneise Lever RN that pt says he does not need oxygen travel tank to transport home; pt d/c'd home; notified Caryl Pina at [facility] of pt's d/c; no TOC needs.     Barriers to Discharge: No Barriers Identified   Patient Goals and CMS Choice CMS Medicare.gov Compare Post Acute Care list provided to:: Patient    Discharge Placement                         Discharge Plan and Services Additional resources added to the After Visit Summary for     Discharge Planning Services: CM Consult                                 Social Determinants of Health (SDOH) Interventions SDOH Screenings   Food Insecurity: No Food Insecurity (02/05/2022)  Housing: Low Risk  (02/05/2022)  Transportation Needs: Unmet Transportation Needs (02/05/2022)  Utilities: Not At Risk (02/05/2022)  Alcohol Screen: Low Risk  (08/20/2021)  Depression (PHQ2-9): High Risk (08/20/2021)  Financial Resource Strain: Low Risk  (08/20/2021)  Social Connections: Unknown (08/20/2021)  Stress: Stress Concern Present (08/20/2021)  Tobacco Use: Medium Risk (02/03/2022)     Readmission Risk Interventions     No data to display

## 2022-02-08 NOTE — Discharge Summary (Signed)
Physician Discharge Summary  BRAIN HONEYCUTT XBD:[id number] DOB: 1955-11-04 DOA: 02/03/2022  PCP: Lacinda Axon, MD  Admit date: 02/03/2022  Discharge date: 02/08/2022  Admitted From:Home  Disposition:  Home  Recommendations for Outpatient Follow-up:  Follow up with PCP in 1-2 weeks Follow-up with pulmonology as scheduled 1/16 Continue on usual home prednisone and use breathing treatments as needed as well as antitussives Use Imodium as needed for diarrhea Continue Augmentin for 23 more days to complete total 4-week course of treatment for pneumonia as recommended by pulmonology  Home Health: None  Equipment/Devices: Has home 2 L nasal cannula oxygen  Discharge Condition:Stable  CODE STATUS: Full  Diet recommendation: Heart Healthy  Brief/Interim Summary: [name] is a pleasant 67 y.o. male with medical history significant for adenocarcinoma of the lung status postresection, renal cell carcinoma status post partial left nephrectomy, and COPD with chronic hypoxic respiratory failure, now presenting to the emergency department with cough, shortness of breath, fatigue, and bloody sputum.  He was admitted with acute COPD exacerbation as well as acute on chronic hypoxemic respiratory failure and is now back to his baseline 2 L nasal cannula after treatment with nebulizers as well as IV steroids.  He was started on Rocephin and azithromycin empirically due to associated pneumonia and was noted to have CT scan with appearance of superinfected bulla that was noted on 12/28.  He was noted to have some mild hemoptysis as well which spontaneously resolved during the course of his stay.  His respiratory viral panel has remained negative.  During the course of his stay, he did develop some diarrhea as well as oral intolerance due to nausea and vomiting which have both now resolved.  His GI pathogen panel as well as C. difficile testing has returned negative.  He is currently in stable  condition for discharge to continue on Augmentin for 23 more days as noted above for total 4-week course treatment as recommended.  He will follow-up on 1/16 with pulmonology and continue his other home medications as prior.  Discharge Diagnoses:  Principal Problem:   COPD with acute exacerbation ([place]) Active Problems:   Hemoptysis   Chronic respiratory failure with hypoxia ([place])   Community acquired pneumonia   Hyponatremia  Principal discharge diagnosis: Acute on chronic hypoxemic respiratory failure secondary to acute COPD exacerbation with community-acquired pneumonia and likely superinfected bulla on CT scan.  Discharge Instructions  Discharge Instructions     Diet - low sodium heart healthy   Complete by: As directed    Increase activity slowly   Complete by: As directed       Allergies as of 02/08/2022   No Known Allergies      Medication List     STOP taking these medications    azithromycin 250 MG tablet Commonly known as: ZITHROMAX   cefUROXime 250 MG tablet Commonly known as: CEFTIN   doxycycline 100 MG tablet Commonly known as: VIBRA-TABS       TAKE these medications    acetaminophen 500 MG tablet Commonly known as: TYLENOL Take 500 mg by mouth every 6 (six) hours as needed for mild pain or headache.   albuterol 108 (90 Base) MCG/ACT inhaler Commonly known as: Proventil HFA INHALE 2 PUFFS BY MOUTH EVERY 6 HOURS AS NEEDED FOR WHEEZING OR SHORTNESS OF BREATH What changed:  how much to take how to take this when to take this reasons to take this additional instructions   albuterol (2.5 MG/3ML) 0.083% nebulizer solution Commonly known  as: PROVENTIL Take 3 mLs (2.5 mg total) by nebulization every 6 (six) hours as needed for wheezing or shortness of breath. What changed: Another medication with the same name was changed. Make sure you understand how and when to take each.   amoxicillin-clavulanate 875-125 MG tablet Commonly known as:  AUGMENTIN Take 1 tablet by mouth every 12 (twelve) hours for 23 days.   carboxymethylcellulose 0.5 % Soln Commonly known as: REFRESH PLUS Place 1 drop into both eyes 3 (three) times daily as needed (dry eyes).   dicyclomine 10 MG capsule Commonly known as: BENTYL Take 1 capsule (10 mg total) by mouth in the morning and at bedtime.   DULoxetine 30 MG capsule Commonly known as: Cymbalta Take 1 capsule (30 mg total) by mouth daily.   eszopiclone 1 MG Tabs tablet Commonly known as: Lunesta Take 1 tablet (1 mg total) by mouth at bedtime as needed for sleep. Take immediately before bedtime   fexofenadine 180 MG tablet Commonly known as: Allegra Allergy Take 1 tablet (180 mg total) by mouth daily.   FLUoxetine 10 MG capsule Commonly known as: PROZAC Take 10 mg by mouth daily as needed (Depression).   fluticasone 50 MCG/ACT nasal spray Commonly known as: FLONASE Place 2 sprays into both nostrils daily.   Guaifenesin 200 MG/5ML Liqd Take 10 mLs (400 mg total) by mouth every 6 (six) hours as needed. What changed: reasons to take this   hydrOXYzine 25 MG tablet Commonly known as: ATARAX Take 1 tablet (25 mg total) by mouth every 6 (six) hours as needed for anxiety (or sleep).   isosorbide mononitrate 30 MG 24 hr tablet Commonly known as: IMDUR Take 0.5 tablets (15 mg total) by mouth daily.   ketoconazole 2 % shampoo Commonly known as: NIZORAL Apply 1 application  topically 2 (two) times a week.   loperamide 2 MG capsule Commonly known as: IMODIUM Take 1 capsule (2 mg total) by mouth as needed for diarrhea or loose stools.   meloxicam 7.5 MG tablet Commonly known as: MOBIC Take 7.5 mg by mouth daily as needed for pain.   mesalamine 1.2 g EC tablet Commonly known as: LIALDA Take 2 tablets by mouth at bedtime.   Myrbetriq 50 MG Tb24 tablet Generic drug: mirabegron ER Take 50 mg by mouth daily.   naproxen sodium 220 MG tablet Commonly known as: ALEVE Take 220 mg by  mouth daily as needed.   nitroGLYCERIN 0.4 MG SL tablet Commonly known as: NITROSTAT Place 1 tablet (0.4 mg total) under the tongue every 5 (five) minutes as needed for chest pain.   omeprazole 40 MG capsule Commonly known as: PRILOSEC Take 1 capsule (40 mg total) by mouth daily.   ondansetron 4 MG tablet Commonly known as: Zofran Take 1 tablet (4 mg total) by mouth every 8 (eight) hours as needed for nausea or vomiting.   OXYGEN Inhale 2 L into the lungs at bedtime.   predniSONE 10 MG tablet Commonly known as: DELTASONE Take 1 tablet (10 mg total) by mouth daily with breakfast. What changed: Another medication with the same name was removed. Continue taking this medication, and follow the directions you see here.   pregabalin 100 MG capsule Commonly known as: LYRICA Take 100 mg by mouth daily.   rifaximin 550 MG Tabs tablet Commonly known as: XIFAXAN Take 1 tablet (550 mg total) by mouth 3 (three) times daily.   rosuvastatin 20 MG tablet Commonly known as: Crestor Take 1 tablet (20 mg total) by  mouth daily.   saccharomyces boulardii 250 MG capsule Commonly known as: FLORASTOR Take 1 capsule (250 mg total) by mouth 2 (two) times daily.   Simethicone 125 MG Tabs Take 1 tablet (125 mg total) by mouth 3 (three) times daily as needed. What changed: reasons to take this   sucralfate 1 g tablet Commonly known as: Carafate Take 1 tablet (1 g total) by mouth 4 (four) times daily -  with meals and at bedtime.   tamsulosin 0.4 MG Caps capsule Commonly known as: FLOMAX Take 0.4 mg by mouth 2 (two) times daily.   traMADol 50 MG tablet Commonly known as: ULTRAM Take 50 mg by mouth 2 (two) times daily as needed.   Trelegy Ellipta 100-62.5-25 MCG/ACT Aepb Generic drug: Fluticasone-Umeclidin-Vilant Inhale 1 puff into the lungs daily.        Follow-up Information     Lacinda Axon, MD. Schedule an appointment as soon as possible for a visit in 1 week(s).    Specialty: Internal Medicine Contact information: [place] [address] [phone]         Martyn Ehrich, NP Follow up on 02/18/2022.   Specialty: Pulmonary Disease Contact information: [place] [address] [phone]                No Known Allergies  Consultations: Pulmonology   Procedures/Studies: DG CHEST PORT 1 VIEW  Result Date: 02/05/2022 CLINICAL DATA:  Shortness of breath EXAM: PORTABLE CHEST 1 VIEW COMPARISON:  02/03/2022 FINDINGS: Surgical clips along the aortopulmonary window. Atherosclerotic calcification of the aortic arch. Stable scarring at the left lung base. Tapering of the peripheral pulmonary vasculature favors emphysema. Prior left upper lobectomy with stable scarring peripherally in the left lower lobe. IMPRESSION: 1. No acute findings. 2. Prior left upper lobectomy. 3. Stable scarring at the left lung base and peripherally in the left lower lobe. 4.  Emphysema (ICD10-J43.9). Electronically Signed   By: Van Clines M.D.   On: 02/05/2022 08:06   DG Chest 2 View  Result Date: 02/03/2022 CLINICAL DATA:  Pneumonia with worsening symptoms. EXAM: CHEST - 2 VIEW COMPARISON:  01/30/2022. FINDINGS: The heart size and mediastinal contours are stable. There is atherosclerotic calcification. Stable emphysematous changes and scarring are noted bilaterally. Minimal residual airspace opacities are present at the lung bases. No consolidation, effusion, or pneumothorax. No acute osseous abnormality. IMPRESSION: 1. Minimal residual airspace opacities at the lung bases. 2. Emphysema. Electronically Signed   By: Brett Fairy M.D.   On: 02/03/2022 20:00   CT Angio Chest PE W and/or Wo Contrast  Result Date: 01/30/2022 CLINICAL DATA:  Cough. EXAM: CT ANGIOGRAPHY CHEST WITH CONTRAST TECHNIQUE: Multidetector CT imaging of the chest was performed using the standard protocol during bolus administration of intravenous  contrast. Multiplanar CT image reconstructions and MIPs were obtained to evaluate the vascular anatomy. RADIATION DOSE REDUCTION: This exam was performed according to the departmental dose-optimization program which includes automated exposure control, adjustment of the mA and/or kV according to patient size and/or use of iterative reconstruction technique. CONTRAST:  7mL OMNIPAQUE IOHEXOL 350 MG/ML SOLN COMPARISON:  November 13, 2021. FINDINGS: Cardiovascular: Satisfactory opacification of the pulmonary arteries to the segmental level. No evidence of pulmonary embolism. Normal heart size. No pericardial effusion. Coronary artery calcifications are noted. Mediastinum/Nodes: No enlarged mediastinal, hilar, or axillary lymph nodes. Thyroid gland, trachea, and esophagus demonstrate no significant findings. Lungs/Pleura: No pneumothorax or pleural effusion is noted. Emphysematous disease is  noted bilaterally. Status post left upper lobectomy. Interval development of mild bibasilar opacities, left greater than right, most consistent with pneumonia. Upper Abdomen: No acute abnormality. Musculoskeletal: No chest wall abnormality. No acute or significant osseous findings. Review of the MIP images confirms the above findings. IMPRESSION: No definite evidence of pulmonary embolus. Interval development of mild bibasilar opacities are noted, left greater than right, most consistent with pneumonia. Coronary artery calcifications are noted suggesting coronary artery disease. Emphysema (ICD10-J43.9). Electronically Signed   By: Marijo Conception M.D.   On: 01/30/2022 17:41   DG Chest 2 View  Result Date: 01/30/2022 CLINICAL DATA:  Cough, chest pain. EXAM: CHEST - 2 VIEW COMPARISON:  July 10, 2021. FINDINGS: The heart size and mediastinal contours are within normal limits. Stable emphysematous change. Minimal bibasilar scarring is noted. No acute pulmonary abnormality is noted. The visualized skeletal structures are unremarkable.  IMPRESSION: Extensive emphysematous disease with minimal bibasilar scarring. No acute abnormality seen. Emphysema (ICD10-J43.9). Electronically Signed   By: Marijo Conception M.D.   On: 01/30/2022 15:49     Discharge Exam: Vitals:   02/07/22 2150 02/08/22 0521  BP: 110/85 116/77  Pulse: 87 66  Resp: 18 19  Temp: 98.1 F (36.7 C) 97.8 F (36.6 C)  SpO2: (!) 89% 90%   Vitals:   02/07/22 1305 02/07/22 2031 02/07/22 2150 02/08/22 0521  BP: (!) 151/99  110/85 116/77  Pulse: (!) 55  87 66  Resp: 18  18 19   Temp: 97.8 F (36.6 C)  98.1 F (36.7 C) 97.8 F (36.6 C)  TempSrc: Oral  Oral   SpO2: 99% 95% (!) 89% 90%  Weight:      Height:        General: Pt is alert, awake, not in acute distress Cardiovascular: RRR, S1/S2 +, no rubs, no gallops Respiratory: CTA bilaterally, no wheezing, no rhonchi, 2 L nasal cannula Abdominal: Soft, NT, ND, bowel sounds + Extremities: no edema, no cyanosis    The results of significant diagnostics from this hospitalization (including imaging, microbiology, ancillary and laboratory) are listed below for reference.     Microbiology: Recent Results (from the past 240 hour(s))  Resp panel by RT-PCR (RSV, Flu A&B, Covid) Anterior Nasal Swab     Status: None   Collection Time: 01/30/22  3:30 PM   Specimen: Anterior Nasal Swab  Result Value Ref Range Status   SARS Coronavirus 2 by RT PCR NEGATIVE NEGATIVE Final    Comment: (NOTE) SARS-CoV-2 target nucleic acids are NOT DETECTED.  The SARS-CoV-2 RNA is generally detectable in upper respiratory specimens during the acute phase of infection. The lowest concentration of SARS-CoV-2 viral copies this assay can detect is 138 copies/mL. A negative result does not preclude SARS-Cov-2 infection and should not be used as the sole basis for treatment or other patient management decisions. A negative result may occur with  improper specimen collection/handling, submission of specimen other than nasopharyngeal  swab, presence of viral mutation(s) within the areas targeted by this assay, and inadequate number of viral copies(<138 copies/mL). A negative result must be combined with clinical observations, patient history, and epidemiological information. The expected result is Negative.  Fact Sheet for Patients:  EntrepreneurPulse.com.au  Fact Sheet for Healthcare Providers:  IncredibleEmployment.be  This test is no t yet approved or cleared by the [place] FDA and  has been authorized for detection and/or diagnosis of SARS-CoV-2 by FDA under an Emergency Use Authorization (EUA). This EUA will remain  in effect (meaning  this test can be used) for the duration of the COVID-19 declaration under Section 564(b)(1) of the Act, 21 U.S.C.section 360bbb-3(b)(1), unless the authorization is terminated  or revoked sooner.       Influenza A by PCR NEGATIVE NEGATIVE Final   Influenza B by PCR NEGATIVE NEGATIVE Final    Comment: (NOTE) The Xpert Xpress SARS-CoV-2/FLU/RSV plus assay is intended as an aid in the diagnosis of influenza from Nasopharyngeal swab specimens and should not be used as a sole basis for treatment. Nasal washings and aspirates are unacceptable for Xpert Xpress SARS-CoV-2/FLU/RSV testing.  Fact Sheet for Patients: EntrepreneurPulse.com.au  Fact Sheet for Healthcare Providers: IncredibleEmployment.be  This test is not yet approved or cleared by the [place] FDA and has been authorized for detection and/or diagnosis of SARS-CoV-2 by FDA under an Emergency Use Authorization (EUA). This EUA will remain in effect (meaning this test can be used) for the duration of the COVID-19 declaration under Section 564(b)(1) of the Act, 21 U.S.C. section 360bbb-3(b)(1), unless the authorization is terminated or revoked.     Resp Syncytial Virus by PCR NEGATIVE NEGATIVE Final    Comment: (NOTE) Fact Sheet for  Patients: EntrepreneurPulse.com.au  Fact Sheet for Healthcare Providers: IncredibleEmployment.be  This test is not yet approved or cleared by the [place] FDA and has been authorized for detection and/or diagnosis of SARS-CoV-2 by FDA under an Emergency Use Authorization (EUA). This EUA will remain in effect (meaning this test can be used) for the duration of the COVID-19 declaration under Section 564(b)(1) of the Act, 21 U.S.C. section 360bbb-3(b)(1), unless the authorization is terminated or revoked.  Performed at [facility], [address], [place], [place] [address]   Culture, blood (routine x 2)     Status: None (Preliminary result)   Collection Time: 02/03/22  7:18 PM   Specimen: BLOOD LEFT ARM  Result Value Ref Range Status   Specimen Description   Final    BLOOD LEFT ARM Performed at [facility] [address], [place], [place] [address]    Special Requests   Final    BOTTLES DRAWN AEROBIC AND ANAEROBIC Blood Culture adequate volume Performed at [facility] [address], [place], [place] [address]    Culture   Final    NO GROWTH 4 DAYS Performed at [facility], [place] [address], [place], [place] [address]    Report Status PENDING  Incomplete  Culture, blood (routine x 2)     Status: None (Preliminary result)   Collection Time: 02/03/22  7:18 PM   Specimen: BLOOD LEFT ARM  Result Value Ref Range Status   Specimen Description   Final    BLOOD LEFT ARM Performed at [facility], [place] [address] [place], [place] [address]    Special Requests   Final    BOTTLES DRAWN AEROBIC AND ANAEROBIC Blood Culture adequate volume Performed at [facility] [address], [place], [place] [address]    Culture   Final    NO GROWTH 4 DAYS Performed at [facility], [place] [address], [place], [place] [address]    Report Status PENDING  Incomplete  Resp  panel by RT-PCR (RSV, Flu A&B, Covid) Anterior Nasal Swab     Status: None   Collection Time: 02/03/22  8:49 PM   Specimen: Anterior Nasal Swab  Result Value Ref Range Status   SARS Coronavirus 2 by RT PCR NEGATIVE NEGATIVE Final  Comment: (NOTE) SARS-CoV-2 target nucleic acids are NOT DETECTED.  The SARS-CoV-2 RNA is generally detectable in upper respiratory specimens during the acute phase of infection. The lowest concentration of SARS-CoV-2 viral copies this assay can detect is 138 copies/mL. A negative result does not preclude SARS-Cov-2 infection and should not be used as the sole basis for treatment or other patient management decisions. A negative result may occur with  improper specimen collection/handling, submission of specimen other than nasopharyngeal swab, presence of viral mutation(s) within the areas targeted by this assay, and inadequate number of viral copies(<138 copies/mL). A negative result must be combined with clinical observations, patient history, and epidemiological information. The expected result is Negative.  Fact Sheet for Patients:  EntrepreneurPulse.com.au  Fact Sheet for Healthcare Providers:  IncredibleEmployment.be  This test is no t yet approved or cleared by the [place] FDA and  has been authorized for detection and/or diagnosis of SARS-CoV-2 by FDA under an Emergency Use Authorization (EUA). This EUA will remain  in effect (meaning this test can be used) for the duration of the COVID-19 declaration under Section 564(b)(1) of the Act, 21 U.S.C.section 360bbb-3(b)(1), unless the authorization is terminated  or revoked sooner.       Influenza A by PCR NEGATIVE NEGATIVE Final   Influenza B by PCR NEGATIVE NEGATIVE Final    Comment: (NOTE) The Xpert Xpress SARS-CoV-2/FLU/RSV plus assay is intended as an aid in the diagnosis of influenza from Nasopharyngeal swab specimens and should not be used as a  sole basis for treatment. Nasal washings and aspirates are unacceptable for Xpert Xpress SARS-CoV-2/FLU/RSV testing.  Fact Sheet for Patients: EntrepreneurPulse.com.au  Fact Sheet for Healthcare Providers: IncredibleEmployment.be  This test is not yet approved or cleared by the [place] FDA and has been authorized for detection and/or diagnosis of SARS-CoV-2 by FDA under an Emergency Use Authorization (EUA). This EUA will remain in effect (meaning this test can be used) for the duration of the COVID-19 declaration under Section 564(b)(1) of the Act, 21 U.S.C. section 360bbb-3(b)(1), unless the authorization is terminated or revoked.     Resp Syncytial Virus by PCR NEGATIVE NEGATIVE Final    Comment: (NOTE) Fact Sheet for Patients: EntrepreneurPulse.com.au  Fact Sheet for Healthcare Providers: IncredibleEmployment.be  This test is not yet approved or cleared by the [place] FDA and has been authorized for detection and/or diagnosis of SARS-CoV-2 by FDA under an Emergency Use Authorization (EUA). This EUA will remain in effect (meaning this test can be used) for the duration of the COVID-19 declaration under Section 564(b)(1) of the Act, 21 U.S.C. section 360bbb-3(b)(1), unless the authorization is terminated or revoked.  Performed at [facility], [place] [address], [place], [place] [address]   MRSA Next Gen by PCR, Nasal     Status: None   Collection Time: 02/04/22  5:32 PM   Specimen: Nasal Mucosa; Nasal Swab  Result Value Ref Range Status   MRSA by PCR Next Gen NOT DETECTED NOT DETECTED Final    Comment: (NOTE) The GeneXpert MRSA Assay (FDA approved for NASAL specimens only), is one component of a comprehensive MRSA colonization surveillance program. It is not intended to diagnose MRSA infection nor to guide or monitor treatment for MRSA infections. Test performance is not  FDA approved in patients less than 5 years old. Performed at [facility], [place] [address], [place], [place] [address]   Respiratory (~20 pathogens) panel by PCR     Status: None   Collection  Time: 02/04/22  5:32 PM   Specimen: Nasal Mucosa; Respiratory  Result Value Ref Range Status   Adenovirus NOT DETECTED NOT DETECTED Final   Coronavirus 229E NOT DETECTED NOT DETECTED Final    Comment: (NOTE) The Coronavirus on the Respiratory Panel, DOES NOT test for the novel  Coronavirus (2019 nCoV)    Coronavirus HKU1 NOT DETECTED NOT DETECTED Final   Coronavirus NL63 NOT DETECTED NOT DETECTED Final   Coronavirus OC43 NOT DETECTED NOT DETECTED Final   Metapneumovirus NOT DETECTED NOT DETECTED Final   Rhinovirus / Enterovirus NOT DETECTED NOT DETECTED Final   Influenza A NOT DETECTED NOT DETECTED Final   Influenza B NOT DETECTED NOT DETECTED Final   Parainfluenza Virus 1 NOT DETECTED NOT DETECTED Final   Parainfluenza Virus 2 NOT DETECTED NOT DETECTED Final   Parainfluenza Virus 3 NOT DETECTED NOT DETECTED Final   Parainfluenza Virus 4 NOT DETECTED NOT DETECTED Final   Respiratory Syncytial Virus NOT DETECTED NOT DETECTED Final   Bordetella pertussis NOT DETECTED NOT DETECTED Final   Bordetella Parapertussis NOT DETECTED NOT DETECTED Final   Chlamydophila pneumoniae NOT DETECTED NOT DETECTED Final   Mycoplasma pneumoniae NOT DETECTED NOT DETECTED Final    Comment: Performed at [facility], [place]. [address] [place], [place] [address]  Gastrointestinal Panel by PCR , Stool     Status: None   Collection Time: 02/05/22 11:38 AM   Specimen: Stool  Result Value Ref Range Status   Campylobacter species NOT DETECTED NOT DETECTED Final   Plesimonas shigelloides NOT DETECTED NOT DETECTED Final   Salmonella species NOT DETECTED NOT DETECTED Final   Yersinia enterocolitica NOT DETECTED NOT DETECTED Final   Vibrio species NOT DETECTED NOT DETECTED Final   Vibrio cholerae NOT  DETECTED NOT DETECTED Final   Enteroaggregative E coli (EAEC) NOT DETECTED NOT DETECTED Final   Enteropathogenic E coli (EPEC) NOT DETECTED NOT DETECTED Final   Enterotoxigenic E coli (ETEC) NOT DETECTED NOT DETECTED Final   Shiga like toxin producing E coli (STEC) NOT DETECTED NOT DETECTED Final   Shigella/Enteroinvasive E coli (EIEC) NOT DETECTED NOT DETECTED Final   Cryptosporidium NOT DETECTED NOT DETECTED Final   Cyclospora cayetanensis NOT DETECTED NOT DETECTED Final   Entamoeba histolytica NOT DETECTED NOT DETECTED Final   Giardia lamblia NOT DETECTED NOT DETECTED Final   Adenovirus F40/41 NOT DETECTED NOT DETECTED Final   Astrovirus NOT DETECTED NOT DETECTED Final   Norovirus GI/GII NOT DETECTED NOT DETECTED Final   Rotavirus A NOT DETECTED NOT DETECTED Final   Sapovirus (I, II, IV, and V) NOT DETECTED NOT DETECTED Final    Comment: Performed at [facility], [place] [place], [place] [address]  Expectorated Sputum Assessment w Gram Stain, Rflx to Resp Cult     Status: None   Collection Time: 02/05/22  5:32 PM   Specimen: Expectorated Sputum  Result Value Ref Range Status   Specimen Description EXPECTORATED SPUTUM  Final   Special Requests NONE  Final   Sputum evaluation   Final    THIS SPECIMEN IS ACCEPTABLE FOR SPUTUM CULTURE Performed at [facility], [place] [address], [place], [place] [address]    Report Status 02/05/2022 FINAL  Final  Culture, Respiratory w Gram Stain     Status: None (Preliminary result)   Collection Time: 02/05/22  5:32 PM  Result Value Ref Range Status   Specimen Description   Final    EXPECTORATED SPUTUM Performed at [facility],  [place] [address], [place], [place] [address]    Special Requests   Final    NONE Reflexed from [id number] [facility], [place] [address], [place], [place] [address]    Gram Stain   Final    NO WBC SEEN RARE BUDDING YEAST SEEN RARE YEAST  WITH PSEUDOHYPHAE RARE GRAM POSITIVE COCCI    Culture   Final    ABUNDANT PSEUDOMONAS AERUGINOSA ABUNDANT ENTEROCOCCUS FAECALIS CULTURE REINCUBATED FOR BETTER GROWTH Performed at [facility], [place] [address], [place], [place] [address]    Report Status PENDING  Incomplete  C Difficile Quick Screen (NO PCR Reflex)     Status: None   Collection Time: 02/07/22 10:22 AM   Specimen: STOOL  Result Value Ref Range Status   C Diff antigen NEGATIVE NEGATIVE Final   C Diff toxin NEGATIVE NEGATIVE Final   C Diff interpretation No C. difficile detected.  Final    Comment: Performed at [facility], [place] [address] [place], [place] [address]     Labs: BNP (last 3 results) No results for input(s): "BNP" in the last 8760 hours. Basic Metabolic Panel: Recent Labs  Lab 02/03/22 1918 02/04/22 0418 02/05/22 0402 02/06/22 0343 02/07/22 0357  NA 132* 135 135 136 138  K 4.3 4.0 4.6 4.0 4.3  CL 101 107 103 106 108  CO2 20* 21* 22 24 24   GLUCOSE 99 177* 138* 104* 131*  BUN 9 9 10 13 14   CREATININE 1.07 0.99 1.05 1.03 1.05  CALCIUM 8.9 8.3* 8.7* 8.4* 8.3*  MG  --  1.8 1.9 2.0 1.9  PHOS  --   --  3.3  --   --    Liver Function Tests: Recent Labs  Lab 02/03/22 1918 02/05/22 0402  AST 16 22  ALT 16 15  ALKPHOS 63 53  BILITOT 0.6 0.4  PROT 7.2 6.4*  ALBUMIN 3.9 3.0*   No results for input(s): "LIPASE", "AMYLASE" in the last 168 hours. No results for input(s): "AMMONIA" in the last 168 hours. CBC: Recent Labs  Lab 02/03/22 1918 02/04/22 0418 02/05/22 0402 02/06/22 0343 02/07/22 0357  WBC 7.1 5.5 12.4* 11.6* 8.8  NEUTROABS 6.2  --  11.2*  --   --   HGB 14.4 12.4* 12.9* 12.0* 12.5*  HCT 43.9 37.7* 40.4 37.5* 38.8*  MCV 95.0 95.2 97.6 97.2 98.7  PLT 269 251 282 264 279   Cardiac Enzymes: No results for input(s): "CKTOTAL", "CKMB", "CKMBINDEX", "TROPONINI" in the last 168 hours. BNP: Invalid input(s): "POCBNP" CBG: No results for input(s): "GLUCAP"  in the last 168 hours. D-Dimer No results for input(s): "DDIMER" in the last 72 hours. Hgb A1c No results for input(s): "HGBA1C" in the last 72 hours. Lipid Profile No results for input(s): "CHOL", "HDL", "LDLCALC", "TRIG", "CHOLHDL", "LDLDIRECT" in the last 72 hours. Thyroid function studies No results for input(s): "TSH", "T4TOTAL", "T3FREE", "THYROIDAB" in the last 72 hours.  Invalid input(s): "FREET3" Anemia work up No results for input(s): "VITAMINB12", "FOLATE", "FERRITIN", "TIBC", "IRON", "RETICCTPCT" in the last 72 hours. Urinalysis    Component Value Date/Time   COLORURINE YELLOW 01/15/2021 2114   APPEARANCEUR CLEAR 01/15/2021 2114   LABSPEC 1.015 01/15/2021 2114   PHURINE 7.5 01/15/2021 2114   GLUCOSEU NEGATIVE 01/15/2021 2114   HGBUR NEGATIVE 01/15/2021 2114   BILIRUBINUR NEGATIVE 01/15/2021 2114   KETONESUR 15 (A) 01/15/2021 2114   PROTEINUR NEGATIVE 01/15/2021 2114   UROBILINOGEN 1.0 12/27/2012 2109   NITRITE  NEGATIVE 01/15/2021 2114   LEUKOCYTESUR NEGATIVE 01/15/2021 2114   Sepsis Labs Recent Labs  Lab 02/04/22 0418 02/05/22 0402 02/06/22 0343 02/07/22 0357  WBC 5.5 12.4* 11.6* 8.8   Microbiology Recent Results (from the past 240 hour(s))  Resp panel by RT-PCR (RSV, Flu A&B, Covid) Anterior Nasal Swab     Status: None   Collection Time: 01/30/22  3:30 PM   Specimen: Anterior Nasal Swab  Result Value Ref Range Status   SARS Coronavirus 2 by RT PCR NEGATIVE NEGATIVE Final    Comment: (NOTE) SARS-CoV-2 target nucleic acids are NOT DETECTED.  The SARS-CoV-2 RNA is generally detectable in upper respiratory specimens during the acute phase of infection. The lowest concentration of SARS-CoV-2 viral copies this assay can detect is 138 copies/mL. A negative result does not preclude SARS-Cov-2 infection and should not be used as the sole basis for treatment or other patient management decisions. A negative result may occur with  improper specimen  collection/handling, submission of specimen other than nasopharyngeal swab, presence of viral mutation(s) within the areas targeted by this assay, and inadequate number of viral copies(<138 copies/mL). A negative result must be combined with clinical observations, patient history, and epidemiological information. The expected result is Negative.  Fact Sheet for Patients:  EntrepreneurPulse.com.au  Fact Sheet for Healthcare Providers:  IncredibleEmployment.be  This test is no t yet approved or cleared by the [place] FDA and  has been authorized for detection and/or diagnosis of SARS-CoV-2 by FDA under an Emergency Use Authorization (EUA). This EUA will remain  in effect (meaning this test can be used) for the duration of the COVID-19 declaration under Section 564(b)(1) of the Act, 21 U.S.C.section 360bbb-3(b)(1), unless the authorization is terminated  or revoked sooner.       Influenza A by PCR NEGATIVE NEGATIVE Final   Influenza B by PCR NEGATIVE NEGATIVE Final    Comment: (NOTE) The Xpert Xpress SARS-CoV-2/FLU/RSV plus assay is intended as an aid in the diagnosis of influenza from Nasopharyngeal swab specimens and should not be used as a sole basis for treatment. Nasal washings and aspirates are unacceptable for Xpert Xpress SARS-CoV-2/FLU/RSV testing.  Fact Sheet for Patients: EntrepreneurPulse.com.au  Fact Sheet for Healthcare Providers: IncredibleEmployment.be  This test is not yet approved or cleared by the [place] FDA and has been authorized for detection and/or diagnosis of SARS-CoV-2 by FDA under an Emergency Use Authorization (EUA). This EUA will remain in effect (meaning this test can be used) for the duration of the COVID-19 declaration under Section 564(b)(1) of the Act, 21 U.S.C. section 360bbb-3(b)(1), unless the authorization is terminated or revoked.     Resp Syncytial  Virus by PCR NEGATIVE NEGATIVE Final    Comment: (NOTE) Fact Sheet for Patients: EntrepreneurPulse.com.au  Fact Sheet for Healthcare Providers: IncredibleEmployment.be  This test is not yet approved or cleared by the [place] FDA and has been authorized for detection and/or diagnosis of SARS-CoV-2 by FDA under an Emergency Use Authorization (EUA). This EUA will remain in effect (meaning this test can be used) for the duration of the COVID-19 declaration under Section 564(b)(1) of the Act, 21 U.S.C. section 360bbb-3(b)(1), unless the authorization is terminated or revoked.  Performed at [facility], [address], [place], [place] [address]   Culture, blood (routine x 2)     Status: None (Preliminary result)   Collection Time: 02/03/22  7:18 PM   Specimen: BLOOD LEFT ARM  Result Value Ref Range Status   Specimen Description  Final    BLOOD LEFT ARM Performed at [facility], [place] [address], [place], [place] [address]    Special Requests   Final    BOTTLES DRAWN AEROBIC AND ANAEROBIC Blood Culture adequate volume Performed at [place] [address] [place], [place] [address]    Culture   Final    NO GROWTH 4 DAYS Performed at [facility], [place] [address], [place], [place] [address]    Report Status PENDING  Incomplete  Culture, blood (routine x 2)     Status: None (Preliminary result)   Collection Time: 02/03/22  7:18 PM   Specimen: BLOOD LEFT ARM  Result Value Ref Range Status   Specimen Description   Final    BLOOD LEFT ARM Performed at [facility], [place] [address] [place], [place] [address]    Special Requests   Final    BOTTLES DRAWN AEROBIC AND ANAEROBIC Blood Culture adequate volume Performed at [place] [address] [place], [place] [address]    Culture   Final    NO GROWTH 4 DAYS Performed at [facility], [place] [address] [place], [place] [address]    Report Status PENDING  Incomplete  Resp panel by RT-PCR (RSV, Flu A&B, Covid) Anterior Nasal Swab     Status: None   Collection Time: 02/03/22  8:49 PM   Specimen: Anterior Nasal Swab  Result Value Ref Range Status   SARS Coronavirus 2 by RT PCR NEGATIVE NEGATIVE Final    Comment: (NOTE) SARS-CoV-2 target nucleic acids are NOT DETECTED.  The SARS-CoV-2 RNA is generally detectable in upper respiratory specimens during the acute phase of infection. The lowest concentration of SARS-CoV-2 viral copies this assay can detect is 138 copies/mL. A negative result does not preclude SARS-Cov-2 infection and should not be used as the sole basis for treatment or other patient management decisions. A negative result may occur with  improper specimen collection/handling, submission of specimen other than nasopharyngeal swab, presence of viral mutation(s) within the areas targeted by this assay, and inadequate number of viral copies(<138 copies/mL). A negative result must be combined with clinical observations, patient history, and epidemiological information. The expected result is Negative.  Fact Sheet for Patients:  EntrepreneurPulse.com.au  Fact Sheet for Healthcare Providers:  IncredibleEmployment.be  This test is no t yet approved or cleared by the [place] FDA and  has been authorized for detection and/or diagnosis of SARS-CoV-2 by FDA under an Emergency Use Authorization (EUA). This EUA will remain  in effect (meaning this test can be used) for the duration of the COVID-19 declaration under Section 564(b)(1) of the Act, 21 U.S.C.section 360bbb-3(b)(1), unless the authorization is terminated  or revoked sooner.       Influenza A by PCR NEGATIVE NEGATIVE Final   Influenza B by PCR NEGATIVE NEGATIVE Final    Comment: (NOTE) The Xpert Xpress SARS-CoV-2/FLU/RSV plus assay is intended as an aid in the diagnosis of  influenza from Nasopharyngeal swab specimens and should not be used as a sole basis for treatment. Nasal washings and aspirates are unacceptable for Xpert Xpress SARS-CoV-2/FLU/RSV testing.  Fact Sheet for Patients: EntrepreneurPulse.com.au  Fact Sheet for Healthcare Providers: IncredibleEmployment.be  This test is not yet approved or cleared by the [place] FDA and has been authorized for detection and/or diagnosis of SARS-CoV-2 by FDA under an Emergency Use Authorization (EUA). This EUA will remain in effect (meaning this test can be  used) for the duration of the COVID-19 declaration under Section 564(b)(1) of the Act, 21 U.S.C. section 360bbb-3(b)(1), unless the authorization is terminated or revoked.     Resp Syncytial Virus by PCR NEGATIVE NEGATIVE Final    Comment: (NOTE) Fact Sheet for Patients: EntrepreneurPulse.com.au  Fact Sheet for Healthcare Providers: IncredibleEmployment.be  This test is not yet approved or cleared by the [place] FDA and has been authorized for detection and/or diagnosis of SARS-CoV-2 by FDA under an Emergency Use Authorization (EUA). This EUA will remain in effect (meaning this test can be used) for the duration of the COVID-19 declaration under Section 564(b)(1) of the Act, 21 U.S.C. section 360bbb-3(b)(1), unless the authorization is terminated or revoked.  Performed at [facility], [place] [address], [place], [place] [address]   MRSA Next Gen by PCR, Nasal     Status: None   Collection Time: 02/04/22  5:32 PM   Specimen: Nasal Mucosa; Nasal Swab  Result Value Ref Range Status   MRSA by PCR Next Gen NOT DETECTED NOT DETECTED Final    Comment: (NOTE) The GeneXpert MRSA Assay (FDA approved for NASAL specimens only), is one component of a comprehensive MRSA colonization surveillance program. It is not intended to diagnose MRSA infection nor to  guide or monitor treatment for MRSA infections. Test performance is not FDA approved in patients less than 25 years old. Performed at [facility], [place] [address] [place], [place] [address]   Respiratory (~20 pathogens) panel by PCR     Status: None   Collection Time: 02/04/22  5:32 PM   Specimen: Nasal Mucosa; Respiratory  Result Value Ref Range Status   Adenovirus NOT DETECTED NOT DETECTED Final   Coronavirus 229E NOT DETECTED NOT DETECTED Final    Comment: (NOTE) The Coronavirus on the Respiratory Panel, DOES NOT test for the novel  Coronavirus (2019 nCoV)    Coronavirus HKU1 NOT DETECTED NOT DETECTED Final   Coronavirus NL63 NOT DETECTED NOT DETECTED Final   Coronavirus OC43 NOT DETECTED NOT DETECTED Final   Metapneumovirus NOT DETECTED NOT DETECTED Final   Rhinovirus / Enterovirus NOT DETECTED NOT DETECTED Final   Influenza A NOT DETECTED NOT DETECTED Final   Influenza B NOT DETECTED NOT DETECTED Final   Parainfluenza Virus 1 NOT DETECTED NOT DETECTED Final   Parainfluenza Virus 2 NOT DETECTED NOT DETECTED Final   Parainfluenza Virus 3 NOT DETECTED NOT DETECTED Final   Parainfluenza Virus 4 NOT DETECTED NOT DETECTED Final   Respiratory Syncytial Virus NOT DETECTED NOT DETECTED Final   Bordetella pertussis NOT DETECTED NOT DETECTED Final   Bordetella Parapertussis NOT DETECTED NOT DETECTED Final   Chlamydophila pneumoniae NOT DETECTED NOT DETECTED Final   Mycoplasma pneumoniae NOT DETECTED NOT DETECTED Final    Comment: Performed at [facility], [place] [address], [place], [place] [address]  Gastrointestinal Panel by PCR , Stool     Status: None   Collection Time: 02/05/22 11:38 AM   Specimen: Stool  Result Value Ref Range Status   Campylobacter species NOT DETECTED NOT DETECTED Final   Plesimonas shigelloides NOT DETECTED NOT DETECTED Final   Salmonella species NOT DETECTED NOT DETECTED Final   Yersinia enterocolitica NOT DETECTED NOT DETECTED  Final   Vibrio species NOT DETECTED NOT DETECTED Final   Vibrio cholerae NOT DETECTED NOT DETECTED Final   Enteroaggregative E coli (EAEC) NOT DETECTED NOT DETECTED Final   Enteropathogenic E coli (EPEC) NOT DETECTED NOT DETECTED Final  Enterotoxigenic E coli (ETEC) NOT DETECTED NOT DETECTED Final   Shiga like toxin producing E coli (STEC) NOT DETECTED NOT DETECTED Final   Shigella/Enteroinvasive E coli (EIEC) NOT DETECTED NOT DETECTED Final   Cryptosporidium NOT DETECTED NOT DETECTED Final   Cyclospora cayetanensis NOT DETECTED NOT DETECTED Final   Entamoeba histolytica NOT DETECTED NOT DETECTED Final   Giardia lamblia NOT DETECTED NOT DETECTED Final   Adenovirus F40/41 NOT DETECTED NOT DETECTED Final   Astrovirus NOT DETECTED NOT DETECTED Final   Norovirus GI/GII NOT DETECTED NOT DETECTED Final   Rotavirus A NOT DETECTED NOT DETECTED Final   Sapovirus (I, II, IV, and V) NOT DETECTED NOT DETECTED Final    Comment: Performed at [facility], [place] [place], [place] [address]  Expectorated Sputum Assessment w Gram Stain, Rflx to Resp Cult     Status: None   Collection Time: 02/05/22  5:32 PM   Specimen: Expectorated Sputum  Result Value Ref Range Status   Specimen Description EXPECTORATED SPUTUM  Final   Special Requests NONE  Final   Sputum evaluation   Final    THIS SPECIMEN IS ACCEPTABLE FOR SPUTUM CULTURE Performed at [place] [address], [place], [place] [address]    Report Status 02/05/2022 FINAL  Final  Culture, Respiratory w Gram Stain     Status: None (Preliminary result)   Collection Time: 02/05/22  5:32 PM  Result Value Ref Range Status   Specimen Description   Final    EXPECTORATED SPUTUM Performed at [place] [address], [place], [place] [address]    Special Requests   Final    NONE Reflexed from [id number] Performed at [facility], [place] [address] [place], [place] [address]     Gram Stain   Final    NO WBC SEEN RARE BUDDING YEAST SEEN RARE YEAST WITH PSEUDOHYPHAE RARE GRAM POSITIVE COCCI    Culture   Final    ABUNDANT PSEUDOMONAS AERUGINOSA ABUNDANT ENTEROCOCCUS FAECALIS CULTURE REINCUBATED FOR BETTER GROWTH Performed at [facility], [place] [address] [place], [place] [address]    Report Status PENDING  Incomplete  C Difficile Quick Screen (NO PCR Reflex)     Status: None   Collection Time: 02/07/22 10:22 AM   Specimen: STOOL  Result Value Ref Range Status   C Diff antigen NEGATIVE NEGATIVE Final   C Diff toxin NEGATIVE NEGATIVE Final   C Diff interpretation No C. difficile detected.  Final    Comment: Performed at [facility], [place] [address], [place], [place] [address]     Time coordinating discharge: 35 minutes  SIGNED:   Rodena Goldmann, DO [facility] 02/08/2022, 8:19 AM  If 7PM-7AM, please contact night-coverage [email or web address]

## 2022-02-08 NOTE — Plan of Care (Signed)
  Problem: Education: Goal: Knowledge of disease or condition will improve Outcome: Progressing Goal: Knowledge of the prescribed therapeutic regimen will improve Outcome: Progressing Goal: Individualized Educational Video(s) Outcome: Progressing   Problem: Activity: Goal: Ability to tolerate increased activity will improve Outcome: Progressing Goal: Will verbalize the importance of balancing activity with adequate rest periods Outcome: Progressing   Problem: Respiratory: Goal: Ability to maintain a clear airway will improve Outcome: Progressing Goal: Levels of oxygenation will improve Outcome: Progressing Goal: Ability to maintain adequate ventilation will improve Outcome: Progressing   Problem: Activity: Goal: Ability to tolerate increased activity will improve Outcome: Progressing   Problem: Clinical Measurements: Goal: Ability to maintain a body temperature in the normal range will improve Outcome: Progressing   Problem: Respiratory: Goal: Ability to maintain adequate ventilation will improve Outcome: Progressing   Problem: Pain Managment: Goal: General experience of comfort will improve Outcome: Progressing   Problem: Safety: Goal: Ability to remain free from injury will improve Outcome: Progressing

## 2022-02-09 LAB — CULTURE, RESPIRATORY W GRAM STAIN: Gram Stain: NONE SEEN

## 2022-02-09 LAB — CULTURE, BLOOD (ROUTINE X 2)
Culture: NO GROWTH
Culture: NO GROWTH
Special Requests: ADEQUATE
Special Requests: ADEQUATE

## 2022-02-10 ENCOUNTER — Telehealth: Payer: Self-pay

## 2022-02-10 NOTE — Patient Outreach (Signed)
  Care Coordination   Note   02/10/2022 Name: KRAMER HANRAHAN MRN: [id number] DOB: 18-Sep-1955  Successful outreach to patient at 10:50 for transition of care call.  Patient requested this writer call him back in a few hours as he was about to shower.  Attempted call back and patient did not answer.  Will attempt on 02/11/22.  Johnney Killian, RN, BSN, CCM Care Management Coordinator [place] Phone: [phone]: [phone]

## 2022-02-11 ENCOUNTER — Telehealth: Payer: Self-pay

## 2022-02-11 NOTE — Patient Outreach (Signed)
  Care Coordination [facility] Note Transition Care Management Unsuccessful Follow-up Telephone Call  Date of discharge and from where:  [facility] 02/08/22  Attempts:  2nd Attempt  Reason for unsuccessful TCM follow-up call:  No answer/busy  Johnney Killian, RN, BSN, CCM Care Management Coordinator [facility] Phone: [phone]: [phone]

## 2022-02-12 ENCOUNTER — Telehealth: Payer: Self-pay

## 2022-02-12 NOTE — Patient Outreach (Signed)
  Care Coordination TOC Note Transition Care Management Unsuccessful Follow-up Telephone Call  Date of discharge and from where:  [facility] 02/08/22  Attempts:  3rd Attempt  Reason for unsuccessful TCM follow-up call:  No answer/busy  Johnney Killian, RN, BSN, CCM Care Management Coordinator [facility] Phone: [phone]: [phone]

## 2022-02-14 DIAGNOSIS — Z7409 Other reduced mobility: Secondary | ICD-10-CM | POA: Diagnosis not present

## 2022-02-18 ENCOUNTER — Telehealth: Payer: Self-pay | Admitting: Primary Care

## 2022-02-18 ENCOUNTER — Ambulatory Visit (INDEPENDENT_AMBULATORY_CARE_PROVIDER_SITE_OTHER): Payer: Medicare Other | Admitting: Primary Care

## 2022-02-18 ENCOUNTER — Ambulatory Visit (INDEPENDENT_AMBULATORY_CARE_PROVIDER_SITE_OTHER): Payer: Medicare Other

## 2022-02-18 ENCOUNTER — Encounter: Payer: Self-pay | Admitting: Primary Care

## 2022-02-18 VITALS — BP 104/62 | HR 73 | Ht 69.0 in | Wt 159.6 lb

## 2022-02-18 DIAGNOSIS — J439 Emphysema, unspecified: Secondary | ICD-10-CM | POA: Diagnosis not present

## 2022-02-18 DIAGNOSIS — R042 Hemoptysis: Secondary | ICD-10-CM

## 2022-02-18 DIAGNOSIS — J189 Pneumonia, unspecified organism: Secondary | ICD-10-CM

## 2022-02-18 DIAGNOSIS — R059 Cough, unspecified: Secondary | ICD-10-CM | POA: Diagnosis not present

## 2022-02-18 DIAGNOSIS — R918 Other nonspecific abnormal finding of lung field: Secondary | ICD-10-CM

## 2022-02-18 MED ORDER — DOXYCYCLINE HYCLATE 100 MG PO TABS: ORAL_TABLET | ORAL | 3 refills | Status: DC

## 2022-02-18 MED ORDER — AZITHROMYCIN 250 MG PO TABS: ORAL_TABLET | ORAL | 3 refills | Status: DC

## 2022-02-18 MED ORDER — PREDNISONE 10 MG PO TABS: ORAL_TABLET | ORAL | 0 refills | Status: DC

## 2022-02-18 MED ORDER — CEFUROXIME AXETIL 250 MG PO TABS: ORAL_TABLET | ORAL | 3 refills | Status: DC

## 2022-02-18 NOTE — Telephone Encounter (Signed)
I guess we should probably hold them - I don't want to contribute to him developing a resistant bug

## 2022-02-18 NOTE — Assessment & Plan Note (Addendum)
-  Admitted for bronchitis/PNA in early January 2024. Felt to have superinfected bulla on CT imaging 01/30/22 - Sputum with some GPC's on sputum culture. Suspect yeast is a colonizer. Abundant pseudomonas aeruginosa and enterococcus faecalis. No resistance to PCN/ only resistant to gentamicin synergy  - He was discharged on 4 weeks Augmentin - Feeling better, no further hemoptysis. Repeat CXR today without acute process; emphysema and scarring

## 2022-02-18 NOTE — Patient Instructions (Addendum)
X-ray today showed no acute process, emphysema and scarring  Recommendations: Continue Trelegy 1 puff daily Prednisone taper as directed, then resume 10 mg daily Continue cyclic antibiotics  - Take azithromycin first week of February, May, August and November - Take cefpodoxime mean first week of March, June, September, December - Take doxycycline first week April, July, October, January Due for CT of your lungs in April 2024 to monitor lung nodules  Follow-up: 3 months with Dr. Delton Coombes or sooner if needed
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
## 2022-02-18 NOTE — Telephone Encounter (Signed)
Patient admitted for bronchitis/PNA in early January 2024. Felt to have superinfected bulla on CT imaging 01/30/22  At risk for resistant organisms given his prophylaxis of rotating abx for the first week of alternating months (Azithromycin, Cefuroxime, Doxycycline).  Sputum with some GPC's on sputum culture. Suspect yeast is a colonizer. Abundant pseudomonas aeruginosa and enterococcus faecalis. No resistance to PCN/ only resistant to gentamicin synergy   He was discharged on 4 weeks Augmentin  Do you want to continue cyclic antibiotic regimen you have him on after completing Augmentin?  -[employer]

## 2022-02-18 NOTE — Assessment & Plan Note (Addendum)
>>  ASSESSMENT AND PLAN FOR COPD WITH EMPHYSEMA (HCC) WRITTEN ON 02/18/2022  4:53 PM BY Jakiah Goree, Earnstine Regal, NP  - Continue Trelegy one puff daily  - Sending in prednisone taper for wheezing; then continue 10mg  daily  - Will discuss with Dr. Delton Coombes about continue cyclic antibiotics   >>ASSESSMENT AND PLAN FOR CHRONIC RESPIRATORY FAILURE WITH HYPOXIA (HCC) WRITTEN ON 11/05/2022  4:57 PM BY TAWKALIYAR, ROYA, DO  >>ASSESSMENT AND PLAN FOR MULTIPLE PULMONARY NODULES WRITTEN ON 02/18/2022  5:09 PM BY Glenford Bayley, NP  - Due for repeat CT imaging in April 2024

## 2022-02-18 NOTE — Assessment & Plan Note (Signed)
>>  ASSESSMENT AND PLAN FOR MULTIPLE PULMONARY NODULES WRITTEN ON 02/18/2022  5:09 PM BY Glenford Bayley, NP  - Due for repeat CT imaging in April 2024

## 2022-02-18 NOTE — Assessment & Plan Note (Signed)
-  Due for repeat CT imaging in April 2024

## 2022-02-19 ENCOUNTER — Ambulatory Visit: Payer: Medicare Other | Admitting: Nurse Practitioner

## 2022-02-19 NOTE — Telephone Encounter (Signed)
Sounds good, I called Pharmacy and told them not to fill prescription. Will D/C

## 2022-02-19 NOTE — Addendum Note (Signed)
Addended by: Glenford Bayley on: 02/19/2022 08:56 AM   Modules accepted: Orders

## 2022-02-19 NOTE — Telephone Encounter (Signed)
Irving Burton can you please call and relay information to patient below, holding off on starting cyclic abx back. He was discharged on 3 week course of Augmentin, can you please make sure he is taking (it was on his list but removed yesterday).

## 2022-02-20 NOTE — Telephone Encounter (Signed)
Attempted to call pt but unable to reach. Left message for pt to return call so we can know if he does still have the augmentin abx that he was prescribed after hospital stay and if so, if he is taking it.

## 2022-02-21 ENCOUNTER — Other Ambulatory Visit: Payer: Self-pay | Admitting: Student

## 2022-02-21 DIAGNOSIS — Z7409 Other reduced mobility: Secondary | ICD-10-CM | POA: Diagnosis not present

## 2022-02-24 ENCOUNTER — Ambulatory Visit (INDEPENDENT_AMBULATORY_CARE_PROVIDER_SITE_OTHER): Payer: 59 | Admitting: Student

## 2022-02-24 VITALS — BP 113/83 | HR 78 | Temp 98.0°F | Ht 69.0 in | Wt 159.2 lb

## 2022-02-24 DIAGNOSIS — J440 Chronic obstructive pulmonary disease with acute lower respiratory infection: Secondary | ICD-10-CM

## 2022-02-24 DIAGNOSIS — E559 Vitamin D deficiency, unspecified: Secondary | ICD-10-CM | POA: Diagnosis not present

## 2022-02-24 DIAGNOSIS — J189 Pneumonia, unspecified organism: Secondary | ICD-10-CM | POA: Diagnosis not present

## 2022-02-24 DIAGNOSIS — J441 Chronic obstructive pulmonary disease with (acute) exacerbation: Secondary | ICD-10-CM | POA: Diagnosis not present

## 2022-02-24 DIAGNOSIS — Z7952 Long term (current) use of systemic steroids: Secondary | ICD-10-CM | POA: Diagnosis not present

## 2022-02-24 DIAGNOSIS — J9611 Chronic respiratory failure with hypoxia: Secondary | ICD-10-CM

## 2022-02-24 DIAGNOSIS — Z87891 Personal history of nicotine dependence: Secondary | ICD-10-CM
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
## 2022-02-24 NOTE — Patient Instructions (Signed)
Thank you, [name] for allowing [place] to provide your care today. Today we discussed your recent hospitalization for pneumonia.  Continue your prednisone taper and all your medications as prescribed.  I have ordered the following labs for you:  Lab Orders         Vitamin D (25 hydroxy)       I will call if any are abnormal. All of your labs can be accessed through "My Chart".  I have ordered the following tests: DEXA   My Chart Access: [email or web address]  Please follow-up in 3 months  Please make sure to arrive 15 minutes prior to your next appointment. If you arrive late, you may be asked to reschedule.    We look forward to seeing you next time. Please call our clinic at [phone] if you have any questions or concerns. The best time to call is Monday-Friday from 9am-4pm, but there is someone available 24/7. If after hours or the weekend, call the main hospital number and ask for the Internal Medicine Resident On-Call. If you need medication refills, please notify your pharmacy one week in advance and they will send [place] a request.   Thank you for letting us take part in your care. Wishing you the best!  Steffanie Rainwater, MD 02/24/2022, 3:15 PM IM Resident, PGY-3 [place] 41:10

## 2022-02-25 ENCOUNTER — Encounter: Payer: Self-pay | Admitting: Student

## 2022-02-25 DIAGNOSIS — J432 Centrilobular emphysema: Secondary | ICD-10-CM | POA: Diagnosis not present

## 2022-02-25 DIAGNOSIS — E559 Vitamin D deficiency, unspecified: Secondary | ICD-10-CM | POA: Insufficient documentation

## 2022-02-25 LAB — VITAMIN D 25 HYDROXY (VIT D DEFICIENCY, FRACTURES): Vit D, 25-Hydroxy: 6.5 ng/mL — ABNORMAL LOW (ref 30.0–100.0)

## 2022-02-25 MED ORDER — VITAMIN D (ERGOCALCIFEROL) 1.25 MG (50000 UNIT) PO CAPS: 50000.0000 [IU] | ORAL_CAPSULE | ORAL | 0 refills | Status: AC

## 2022-02-25 NOTE — Assessment & Plan Note (Signed)
>>  ASSESSMENT AND PLAN FOR CHRONIC RESPIRATORY FAILURE WITH HYPOXIA (HCC) WRITTEN ON 02/25/2022  2:49 PM BY AMPONSAH, Flossie Buffy, MD  Reports occasional dyspnea on exertion.  His SpO2 has been above 90% at home on 2 L [place].  Reports having a headache a few weeks ago after increasing his O2 to 3 L [place].  Saw the patient on monitor his O2 between low to mid 90s. -Continue O2 supplementation -Follow-up with pulmonology

## 2022-02-25 NOTE — Assessment & Plan Note (Addendum)
Patient presented to the [facility] ER for evaluation of shortness of breath, cough and hemoptysis and admitted from 02/03/2022 to 02/08/2022 for community-acquired pneumonia and COPD exacerbation.  Patient also found to have superinfected bullae on CTA chest on 12/28. During the hospitalization, respiratory viral panels were negative but sputum cultures were positive for abundant Pseudomonas aeruginosa and Enterococcus faecalis with no resistance to penicillins. Patient's symptoms improved and he was discharged home on his baseline 2 L [place] to continue 4 weeks course of Augmentin as well as a steroid taper.  Patient was evaluated by pulmonology on 02/18/2022 after finishing his Augmentin. Repeat chest x-ray did not show any new cardiopulmonary disease but stable emphysematous changes and pulmonary scarring.  Patient advised to continue steroid taper. Patient states he has been feeling well since discharge and has not had any fevers, chills no shortness of breath. He does endorse chronic dyspnea or exertion as well as occasional wheezing and diarrhea. Patient off his O2 during my assessment. Lung sounds does not reveal any wheezing, rales or rhonchi but has decreased air movement at the bases. -Completed 4 weeks course of Augmentin

## 2022-02-25 NOTE — Assessment & Plan Note (Signed)
Reports occasional dyspnea on exertion.  His SpO2 has been above 90% at home on 2 L [place].  Reports having a headache a few weeks ago after increasing his O2 to 3 L .  Saw the patient on monitor his O2 between low to mid 90s. -Continue O2 supplementation -Follow-up with pulmonology

## 2022-02-25 NOTE — Assessment & Plan Note (Addendum)
67 year old on chronic steroid use found to have vitamin D level significantly low at 6.5.  Discussed with patient plan to supplement as well as obtaining DEXA scan to screen him for medication-induced osteoporosis. Calcium mildly low at 8.3 two weeks ago.  Likely corrects with his history of low albumin but will repeat CMP at next office visit  Plan: -Start vitamin D 50,000 units every 7 days for 8 weeks -Follow-up in 2-3 months for CMP and repeat vitamin D levels -DEXA scan

## 2022-02-25 NOTE — Progress Notes (Signed)
Saw patient yesterday. 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 He is currently getting PT ordered by his orthopedic surgeon. He is also followed by [facility] pain clinic. Found to have very low vitamin D levels at his visit with me so I am starting him on vitamin D supplementations. I am also getting a DEXA scan.  I have a very low threshold to initiate bisphosphonate therapy on him however will wait on the DEXA scan results.

## 2022-02-25 NOTE — Assessment & Plan Note (Addendum)
Patient presented to the [facility] ER for evaluation of shortness of breath, cough and hemoptysis and admitted from 02/03/2022 to 02/08/2022 for community-acquired pneumonia and COPD exacerbation.  Patient also found to have superinfected bullae on CTA chest on 12/28.  During the hospitalization, respiratory viral panels were negative but sputum cultures were positive for abundant Pseudomonas aeruginosa and Enterococcus faecalis with no resistance to penicillins.  Patient's symptoms improved and he was discharged home on his baseline 2 L [place] to continue 4 weeks course of Augmentin as well as a steroid taper.  Patient was evaluated by pulmonology on 02/18/2022 after finishing his Augmentin.  Repeat chest x-ray did not show any new cardiopulmonary disease but stable emphysematous changes and pulmonary scarring.  Patient advised to continue steroid taper.  Patient states he has been feeling well since discharge and has not had any fevers, chills or shortness of breath.  He does endorse chronic dyspnea on exertion as well as occasional wheezing and diarrhea. Patient off his O2 during my assessment. Lung sounds does not reveal any wheezing, rales or rhonchi but has decreased air movement at the bases.  Patient on baseline prednisone 10 mg daily but currently on a tapering dose.  Plan: -Continue prednisone taper (4 tabs for 2 days, then 3 tabs for 2 days, 2 tabs for 2 days, then 1 tab for 2 days, then resume home 10 mg daily) -Continue Trelegy 1 puff daily -Continue as needed albuterol inhaler and nebulizer -Follow-up with pulmonology as needed, pulm holding off on cyclic antibiotics for now

## 2022-02-28 NOTE — Telephone Encounter (Signed)
Attempted to call pt but unable to reach. Left message to return call.   Due to multiple attempts trying to reach pt without being able to do so, per protocol encounter will be closed.

## 2022-03-05 DIAGNOSIS — Z7409 Other reduced mobility: Secondary | ICD-10-CM | POA: Diagnosis not present

## 2022-03-05 NOTE — Progress Notes (Signed)
Internal Medicine Clinic Attending  Case discussed with Dr. [place]  At the time of the visit.  We reviewed the resident's history and exam and pertinent patient test results.  I agree with the assessment, diagnosis, and plan of care documented in the resident's note.  

## 2022-03-07 DIAGNOSIS — R350 Frequency of micturition: Secondary | ICD-10-CM | POA: Diagnosis not present

## 2022-03-17 ENCOUNTER — Telehealth: Payer: Self-pay | Admitting: Emergency Medicine

## 2022-03-18 MED ORDER — PREDNISONE 10 MG PO TABS: 10.0000 mg | ORAL_TABLET | Freq: Every day | ORAL | 0 refills | Status: DC

## 2022-03-18 NOTE — Telephone Encounter (Signed)
Called the pt and there was no answer- left detailed msg that I have refilled pred  Nothing further needed

## 2022-03-19 ENCOUNTER — Encounter: Payer: Self-pay | Admitting: Nurse Practitioner

## 2022-03-19 ENCOUNTER — Ambulatory Visit: Payer: 59 | Attending: Nurse Practitioner | Admitting: Nurse Practitioner

## 2022-03-19 VITALS — BP 130/80 | HR 61 | Wt 160.0 lb

## 2022-03-19 DIAGNOSIS — J449 Chronic obstructive pulmonary disease, unspecified: Secondary | ICD-10-CM

## 2022-03-19 DIAGNOSIS — I2510 Atherosclerotic heart disease of native coronary artery without angina pectoris: Secondary | ICD-10-CM | POA: Diagnosis not present

## 2022-03-19 DIAGNOSIS — R072 Precordial pain: Secondary | ICD-10-CM | POA: Diagnosis not present

## 2022-03-19 DIAGNOSIS — R030 Elevated blood-pressure reading, without diagnosis of hypertension: Secondary | ICD-10-CM | POA: Diagnosis not present

## 2022-03-19 DIAGNOSIS — E785 Hyperlipidemia, unspecified: Secondary | ICD-10-CM | POA: Diagnosis not present

## 2022-03-19 DIAGNOSIS — I739 Peripheral vascular disease, unspecified: Secondary | ICD-10-CM | POA: Diagnosis not present

## 2022-03-19 DIAGNOSIS — J9611 Chronic respiratory failure with hypoxia: Secondary | ICD-10-CM

## 2022-03-19 DIAGNOSIS — Z87891 Personal history of nicotine dependence: Secondary | ICD-10-CM

## 2022-03-19 DIAGNOSIS — C3490 Malignant neoplasm of unspecified part of unspecified bronchus or lung: Secondary | ICD-10-CM

## 2022-03-19 MED ORDER — ISOSORBIDE MONONITRATE ER 30 MG PO TB24: 30.0000 mg | ORAL_TABLET | Freq: Every day | ORAL | 3 refills | Status: DC

## 2022-03-19 NOTE — Patient Instructions (Signed)
Medication Instructions:  Increase Imdur 30 mg daily  *If you need a refill on your cardiac medications before your next appointment, please call your pharmacy*   Lab Work: Your physician recommends that you return for lab work in 1 weeks  Lipid & LFT   If you have labs (blood work) drawn today and your tests are completely normal, you will receive your results only by: [place] (if you have MyChart) OR A paper copy in the mail If you have any lab test that is abnormal or we need to change your treatment, we will call you to review the results.   Testing/Procedures: NONE ordered at this time of appointment     Follow-Up: At [facility], you and your health needs are our priority.  As part of our continuing mission to provide you with exceptional heart care, we have created designated Provider Care Teams.  These Care Teams include your primary Cardiologist (physician) and Advanced Practice Providers (APPs -  Physician Assistants and Nurse Practitioners) who all work together to provide you with the care you need, when you need it.  We recommend signing up for the patient portal called "MyChart".  Sign up information is provided on this After Visit Summary.  MyChart is used to connect with patients for Virtual Visits (Telemedicine).  Patients are able to view lab/test results, encounter notes, upcoming appointments, etc.  Non-urgent messages can be sent to your provider as well.   To learn more about what you can do with MyChart, go to [email or web address].    Your next appointment:   3 month(s)  Provider:   Diona Browner, NP        Other Instructions
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
## 2022-03-20 DIAGNOSIS — Z7409 Other reduced mobility: Secondary | ICD-10-CM | POA: Diagnosis not present

## 2022-03-26 ENCOUNTER — Telehealth: Payer: Self-pay | Admitting: Emergency Medicine

## 2022-03-26 ENCOUNTER — Encounter: Payer: Self-pay | Admitting: Student

## 2022-03-26 ENCOUNTER — Ambulatory Visit (INDEPENDENT_AMBULATORY_CARE_PROVIDER_SITE_OTHER): Payer: 59 | Admitting: Student

## 2022-03-26 VITALS — BP 154/94 | HR 65 | Temp 98.7°F | Wt 163.0 lb

## 2022-03-26 DIAGNOSIS — F32A Depression, unspecified: Secondary | ICD-10-CM | POA: Diagnosis not present

## 2022-03-26 DIAGNOSIS — J439 Emphysema, unspecified: Secondary | ICD-10-CM

## 2022-03-26 DIAGNOSIS — F419 Anxiety disorder, unspecified: Secondary | ICD-10-CM | POA: Diagnosis not present

## 2022-03-26 DIAGNOSIS — I10 Essential (primary) hypertension: Secondary | ICD-10-CM | POA: Diagnosis not present

## 2022-03-26 DIAGNOSIS — Z87891 Personal history of nicotine dependence: Secondary | ICD-10-CM | POA: Diagnosis not present

## 2022-03-26 NOTE — Telephone Encounter (Signed)
Needs visit with CXR prior

## 2022-03-26 NOTE — Telephone Encounter (Signed)
[name] came in still not feeling well. Very congested. The Pred we issues earlier this mo did not help. Dcln appt this Fri w/Ms/ Cobb because it was an early hour. May still like to be seen if we can not send in and RX for more help.   Coughing Wheezing/rattle  Pls. Call to advise and sched if needed @ [phone]

## 2022-03-26 NOTE — Patient Instructions (Signed)
Thank you, [name] for allowing [place] to provide your care today. Today we discussed your depression, chronic lung disease and chronic medical problems.    Call your pulmonologist to schedule follow-up appointment.  I have place a referrals to behavioral health for cognitive behavioral therapy.  My Chart Access: [email or web address]  Please follow-up in April as scheduled  Please make sure to arrive 15 minutes prior to your next appointment. If you arrive late, you may be asked to reschedule.    We look forward to seeing you next time. Please call our clinic at [phone] if you have any questions or concerns. The best time to call is Monday-Friday from 9am-4pm, but there is someone available 24/7. If after hours or the weekend, call the main hospital number and ask for the Internal Medicine Resident On-Call. If you need medication refills, please notify your pharmacy one week in advance and they will send [place] a request.   Thank you for letting us take part in your care. Wishing you the best!  Lacinda Axon, MD 03/26/2022, 2:26 PM IM Resident, PGY-3 [place] 41:10

## 2022-03-26 NOTE — Telephone Encounter (Signed)
Seen by Volanda Napoleon NP 02/26/2022
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
## 2022-03-26 NOTE — Telephone Encounter (Signed)
Pt states he is very congested and states he feels rattling in his chest, pt has been coughing with green phlegm, sob and wheezing. Pt states inhalers and nebulizer medication occasionally helps. Would like something else to help relieve symptoms if possible.  Pharmacy [facility] on [address] since Dr. Lamonte Sakai is unavailable please advise

## 2022-03-27 ENCOUNTER — Encounter: Payer: Self-pay | Admitting: Student

## 2022-03-27 ENCOUNTER — Ambulatory Visit: Payer: 59

## 2022-03-27 DIAGNOSIS — G8929 Other chronic pain: Secondary | ICD-10-CM | POA: Diagnosis not present

## 2022-03-27 DIAGNOSIS — M5416 Radiculopathy, lumbar region: Secondary | ICD-10-CM | POA: Diagnosis not present

## 2022-03-27 DIAGNOSIS — M7918 Myalgia, other site: Secondary | ICD-10-CM | POA: Diagnosis not present

## 2022-03-27 DIAGNOSIS — J441 Chronic obstructive pulmonary disease with (acute) exacerbation: Secondary | ICD-10-CM | POA: Diagnosis not present

## 2022-03-27 DIAGNOSIS — M47816 Spondylosis without myelopathy or radiculopathy, lumbar region: Secondary | ICD-10-CM | POA: Diagnosis not present

## 2022-03-27 DIAGNOSIS — G8912 Acute post-thoracotomy pain: Secondary | ICD-10-CM | POA: Diagnosis not present

## 2022-03-27 NOTE — Addendum Note (Signed)
Addended by: Rosana Berger on: 03/27/2022 08:30 AM   Modules accepted: Orders

## 2022-03-27 NOTE — Telephone Encounter (Signed)
Spoke with the pt and scheduled appt with MW tomorrow at 1:30  CXR ordered

## 2022-03-27 NOTE — Assessment & Plan Note (Addendum)
Patient here for follow-up on his depression. Patient reports increasing depressed mood in the setting of his chronic medical problems. He has chronic back pain that continues to give him problems. He continues to have respiratory problems with intermittent hospitalizations for pneumonia and COPD exacerbation.  His PHQ-9 in clinic today is 9, however patient states his depression makes it difficult for him to do his daily activities and has been affecting his quality of life. States he was referred to therapy last year but did not get a call from anyone. Currently tolerating Cymbalta and interested in CBT to help manage his depression.  Plan: -Continue Cymbalta at 30 mg daily -Ambulatory referral to behavioral health for CBT -Follow-up as needed

## 2022-03-27 NOTE — Assessment & Plan Note (Signed)
Patient continues to endorse persistent cough that is occasionally productive. He has no significant change in his baseline shortness of breath or dyspnea on exertion.  No fevers or chills. Remains on 2 L [place] with SpO2 of 96% in clinic today. No wheezing or rhonchi on exam today. Low concern for pneumonia or COPD exacerbation. Patient advised to follow-up with pulmonology.  Plan: -Follow-up with pulmonology as scheduled on 2/23 -Continue Trelegy -Continue O2 supplementation

## 2022-03-27 NOTE — Assessment & Plan Note (Signed)
Patient has been relatively stable until today. Patient under some stress due to some paperwork he needs to complete for the [place]. BP elevated today with SBP in the 150s. Patient's only antihypertensive is Imdur 30 mg daily and he has been adherent to this medication. Discussed plan to have patient get a BP cuff and check his blood pressure at home a few times a week. Patient advised to bring the log to his next office visit to determine need to modify his antihypertensive regimen.  Plan: -Continue Imdur 30 mg daily -Bring BP log to next office visit

## 2022-03-28 ENCOUNTER — Encounter: Payer: Self-pay | Admitting: Internal Medicine

## 2022-03-28 ENCOUNTER — Ambulatory Visit (INDEPENDENT_AMBULATORY_CARE_PROVIDER_SITE_OTHER): Payer: 59 | Admitting: Internal Medicine

## 2022-03-28 ENCOUNTER — Ambulatory Visit (INDEPENDENT_AMBULATORY_CARE_PROVIDER_SITE_OTHER): Payer: 59

## 2022-03-28 VITALS — BP 150/84 | HR 64 | Temp 97.4°F | Ht 69.0 in | Wt 163.0 lb

## 2022-03-28 DIAGNOSIS — J432 Centrilobular emphysema: Secondary | ICD-10-CM | POA: Diagnosis not present

## 2022-03-28 DIAGNOSIS — J9611 Chronic respiratory failure with hypoxia: Secondary | ICD-10-CM | POA: Diagnosis not present

## 2022-03-28 DIAGNOSIS — J439 Emphysema, unspecified: Secondary | ICD-10-CM

## 2022-03-28 DIAGNOSIS — R0602 Shortness of breath: Secondary | ICD-10-CM | POA: Diagnosis not present

## 2022-03-28 MED ORDER — PREDNISONE 10 MG PO TABS: 10.0000 mg | ORAL_TABLET | Freq: Every day | ORAL | 2 refills | Status: DC

## 2022-03-28 MED ORDER — CIPROFLOXACIN HCL 500 MG PO TABS: 500.0000 mg | ORAL_TABLET | Freq: Two times a day (BID) | ORAL | 0 refills | Status: DC

## 2022-03-28 NOTE — Assessment & Plan Note (Addendum)
>>  ASSESSMENT AND PLAN FOR COPD WITH EMPHYSEMA (HCC) WRITTEN ON 03/28/2022  1:50 PM BY Sherene Sires, Kristiann Noyce B, MD  Quit smoking 2021  02/13/2016-pulmonary function test- FVC 3.09 (80% predicted), postbronchodilator ratio 40, postbronchodilator FEV1 1.78 (59% predicted), positive bronchodilator response with FVC as well as mid flow reversibility, DLCO 23 - 03/28/2022  After extensive coaching inhaler device,  effectiveness =    75% (short Ti, multiple triggers at baseline)   Group D (now reclassified as E) in terms of symptom/risk and laba/lama/ICS  therefore appropriate rx at this point >>>  trelegy for now but if cough and recurrent infection an issue would favor stiolto and daily pred taper to minimum effective dosing   Rx cipro x 10 days for green sputum with recent pos sputum for psuedomonas sensitive to cipro  Max mucinex and teach flutter valve  F/u 10 days to regroup     >>ASSESSMENT AND PLAN FOR CHRONIC RESPIRATORY FAILURE WITH HYPOXIA (HCC) WRITTEN ON 03/28/2022  1:52 PM BY Salisha Bardsley B, MD  On 2lpm sats ok at rest  Advised: Make sure you check your oxygen saturation  AT  your highest level of activity (not after you stop)   to be sure it stays over 90% and adjust  02 flow upward to maintain this level if needed but remember to turn it back to previous settings when you stop (to conserve your supply).          Each maintenance medication was reviewed in detail including emphasizing most importantly the difference between maintenance and prns and under what circumstances the prns are to be triggered using an action plan format where appropriate.  Total time for H and P, chart review, counseling, reviewing hfa/neb/dpi/02/flutter valve   device(s) and generating customized AVS unique to this Acute office visit / same day charting = 40 min with pt new to me

## 2022-03-28 NOTE — Assessment & Plan Note (Addendum)
On 2lpm sats ok at rest  Advised: Make sure you check your oxygen saturation  AT  your highest level of activity (not after you stop)   to be sure it stays over 90% and adjust  02 flow upward to maintain this level if needed but remember to turn it back to previous settings when you stop (to conserve your supply).          Each maintenance medication was reviewed in detail including emphasizing most importantly the difference between maintenance and prns and under what circumstances the prns are to be triggered using an action plan format where appropriate.  Total time for H and P, chart review, counseling, reviewing hfa/neb/dpi/02/flutter valve   device(s) and generating customized AVS unique to this Acute office visit / same day charting = 40 min with pt new to me

## 2022-03-28 NOTE — Patient Instructions (Signed)
Mucinex 1200 mg every 12 hours and use the flutter valve as much as you can  Cipro 500 mg twice daily x 10 days   Follow up 10 days with nurse practitioner
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
## 2022-04-01 NOTE — Progress Notes (Signed)
Internal Medicine Clinic Attending  Case discussed with Dr. Amponsah  at the time of the visit.  We reviewed the resident's history and exam and pertinent patient test results.  I agree with the assessment, diagnosis, and plan of care documented in the resident's note.  

## 2022-04-02 ENCOUNTER — Encounter: Payer: Self-pay | Admitting: Gastroenterology

## 2022-04-02 ENCOUNTER — Ambulatory Visit (INDEPENDENT_AMBULATORY_CARE_PROVIDER_SITE_OTHER): Payer: 59 | Admitting: Gastroenterology

## 2022-04-02 VITALS — BP 122/80 | HR 76 | Ht 69.0 in | Wt 163.4 lb

## 2022-04-02 DIAGNOSIS — R110 Nausea: Secondary | ICD-10-CM

## 2022-04-02 DIAGNOSIS — Z792 Long term (current) use of antibiotics: Secondary | ICD-10-CM | POA: Diagnosis not present

## 2022-04-02 DIAGNOSIS — R1084 Generalized abdominal pain: Secondary | ICD-10-CM

## 2022-04-02 DIAGNOSIS — R194 Change in bowel habit: Secondary | ICD-10-CM

## 2022-04-02 DIAGNOSIS — K529 Noninfective gastroenteritis and colitis, unspecified: Secondary | ICD-10-CM

## 2022-04-02 DIAGNOSIS — R109 Unspecified abdominal pain: Secondary | ICD-10-CM | POA: Diagnosis not present

## 2022-04-02 DIAGNOSIS — R112 Nausea with vomiting, unspecified: Secondary | ICD-10-CM
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
## 2022-04-02 NOTE — Patient Instructions (Addendum)
You have been scheduled for a gastric emptying scan at [facility] on 05/24/2022 at 7:30am. Please arrive at least 30 minutes prior to your appointment for registration. Please make certain not to have anything to eat or drink after midnight the night before your test. Hold all stomach medications (ex: Zofran, phenergan, Reglan) 24 hours prior to your test. If you need to reschedule your appointment, please contact radiology scheduling at [phone]. _____________________________________________________________________ A gastric-emptying study measures how long it takes for food to move through your stomach. There are several ways to measure stomach emptying. In the most common test, you eat food that contains a small amount of radioactive material. A scanner that detects the movement of the radioactive material is placed over your abdomen to monitor the rate at which food leaves your stomach. This test normally takes about 4 hours to complete. _____________________________________________________________________   [name] have been given a testing kit to check for small intestine bacterial overgrowth (SIBO) which is completed by a company named [facility]. Make sure to return your test in the mail using the return mailing label given to you along with the kit. Your demographic and insurance information have already been sent to the company and they should be in contact with you over the next 1-2 weeks regarding this test. [facility] will collect an upfront charge of $99.74 for commercial insurance plans and $209.74 is you are paying cash. Make sure to discuss with [facility] PRIOR to having the test to see if they have gotten information from your insurance company as to how much your testing will cost out of pocket, if any. Please keep in mind that you will be getting a call from phone number [phone] or a similar number. If you do not hear from them within this time frame, please  call our office at [phone] or call [facility] directly at [phone].   Please continue with he Lialda, try to start to take 3 - 4 pills a day.   We have scheduled you for a follow up with [name] on 05/19/2022 at 2:30pm   _______________________________________________________  If your blood pressure at your visit was 140/90 or greater, please contact your primary care physician to follow up on this.  _______________________________________________________  If you are age 64 or older, your body mass index should be between 23-30. Your Body mass index is 24.13 kg/m. If this is out of the aforementioned range listed, please consider follow up with your Primary Care Provider.  If you are age 72 or younger, your body mass index should be between 19-25. Your Body mass index is 24.13 kg/m. If this is out of the aformentioned range listed, please consider follow up with your Primary Care Provider.   ________________________________________________________  The [facility] providers would like to encourage you to use [facility] to communicate with providers for non-urgent requests or questions.  Due to long hold times on the telephone, sending your provider a message by [facility] may be a faster and more efficient way to get a response.  Please allow 48 business hours for a response.  Please remember that this is for non-urgent requests.  _______________________________________________________ It was a pleasure to see you today!  Thank you for trusting me with your gastrointestinal care!

## 2022-04-04 ENCOUNTER — Encounter: Payer: Self-pay | Admitting: Gastroenterology

## 2022-04-07 ENCOUNTER — Institutional Professional Consult (permissible substitution): Payer: 59 | Admitting: Licensed Clinical Social Worker

## 2022-04-07 DIAGNOSIS — M4726 Other spondylosis with radiculopathy, lumbar region: Secondary | ICD-10-CM | POA: Diagnosis not present

## 2022-04-07 DIAGNOSIS — Z7409 Other reduced mobility: Secondary | ICD-10-CM | POA: Diagnosis not present

## 2022-04-07 DIAGNOSIS — M7918 Myalgia, other site: Secondary | ICD-10-CM | POA: Diagnosis not present

## 2022-04-07 DIAGNOSIS — R29898 Other symptoms and signs involving the musculoskeletal system: Secondary | ICD-10-CM | POA: Diagnosis not present

## 2022-04-07 DIAGNOSIS — G8929 Other chronic pain: Secondary | ICD-10-CM | POA: Diagnosis not present

## 2022-04-08 NOTE — Telephone Encounter (Signed)
PT called. I read report to him on rads. He still would like a call from a nurse. States not better. Does not feel Antibx or Musinex working. His Number [phone]  Call back after an hour.

## 2022-04-09 NOTE — Telephone Encounter (Signed)
[facility] pt LVM for him to call office back.

## 2022-04-10 DIAGNOSIS — R3915 Urgency of urination: Secondary | ICD-10-CM | POA: Diagnosis not present

## 2022-04-10 DIAGNOSIS — C642 Malignant neoplasm of left kidney, except renal pelvis: Secondary | ICD-10-CM | POA: Diagnosis not present

## 2022-04-11 ENCOUNTER — Ambulatory Visit (INDEPENDENT_AMBULATORY_CARE_PROVIDER_SITE_OTHER): Payer: 59 | Admitting: Primary Care

## 2022-04-11 ENCOUNTER — Encounter: Payer: Self-pay | Admitting: Primary Care

## 2022-04-11 VITALS — BP 122/80 | HR 85 | Temp 97.6°F | Ht 69.0 in | Wt 169.0 lb

## 2022-04-11 DIAGNOSIS — J432 Centrilobular emphysema: Secondary | ICD-10-CM

## 2022-04-11 DIAGNOSIS — R0602 Shortness of breath: Secondary | ICD-10-CM

## 2022-04-11 DIAGNOSIS — J9611 Chronic respiratory failure with hypoxia: Secondary | ICD-10-CM

## 2022-04-11 MED ORDER — METHYLPREDNISOLONE ACETATE 80 MG/ML IJ SUSP
80.0000 mg | Freq: Once | INTRAMUSCULAR | Status: AC
  Administered 2022-04-11: 80 mg via INTRAMUSCULAR

## 2022-04-11 MED ORDER — PREDNISONE 10 MG PO TABS: ORAL_TABLET | ORAL | 0 refills | Status: DC

## 2022-04-11 MED ORDER — IPRATROPIUM-ALBUTEROL 0.5-2.5 (3) MG/3ML IN SOLN
3.0000 mL | Freq: Once | RESPIRATORY_TRACT | Status: AC
  Administered 2022-04-11: 3 mL via RESPIRATORY_TRACT

## 2022-04-11 MED ORDER — PREDNISONE 10 MG PO TABS: ORAL_TABLET | ORAL | 0 refills | Status: AC

## 2022-04-11 MED ORDER — AMOXICILLIN-POT CLAVULANATE 875-125 MG PO TABS: 1.0000 | ORAL_TABLET | Freq: Two times a day (BID) | ORAL | 0 refills | Status: DC
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
## 2022-04-11 NOTE — Patient Instructions (Addendum)
Recommendations: Start mucinex '1200mg'$  twice daily Continue Trelegy as directed  Use Albuterol nebulizer every 6 hours  Continue flutter valve  Office treatment: Depo-medrol '80mg'$  x1 Ipratropium-albuterol nebulizer x1  Rx: Pred taper as directed then resume '10mg'$  daily   Augmentin x 10 days   Follow-up Please consider ED evaluation, you likely need admission for IV antibiotics

## 2022-04-11 NOTE — Assessment & Plan Note (Addendum)
>>  ASSESSMENT AND PLAN FOR COPD WITH EMPHYSEMA (HCC) WRITTEN ON 04/11/2022  5:39 PM BY Glenford Bayley, NP  - Patient was admitted in January for infected bulla. Sputum culture positive for abundant pseudomonas aeruginosa and enterococcus faecalis. No resistance to PCN or ciprofloxacin. He was discharged on 4 weeks Augmentin and did fairly well until he stopped that. He saw Dr. Sherene Sires 03/28/22 for sob/productive cough and was given 10 day course of cipro. He is not feeling better. Continues to have chest congestion and productive cough with purulence. Lungs were fairly clear on exam today, he does have some upper airway wheezing and congestion. He has no new oxygen requirements. Advised he go to ED, likely needs admission for IV antibiotics as he has failed outpatient treatment. He wants to hold off until Monday, he is aware this is against my medical advice and tells me he will likely go sometime this weekend. Discuss with case with Dr. Delton Coombes who is aware of above and in agreement with plan. We have sent in RX for Augmentin twice daily x 10 days and prednisone taper. He received depomedrol 80mg  IM and ipratropium-albuterol nebulizer for upper airway symptoms.   >>ASSESSMENT AND PLAN FOR CHRONIC RESPIRATORY FAILURE WITH HYPOXIA (HCC) WRITTEN ON 04/11/2022  5:35 PM BY Jasnoor Trussell W, NP  - O2 remains stable on 2L

## 2022-04-11 NOTE — Assessment & Plan Note (Signed)
-   O2 remains stable on 2L

## 2022-04-14 ENCOUNTER — Telehealth: Payer: Self-pay | Admitting: Primary Care

## 2022-04-14 DIAGNOSIS — R110 Nausea: Secondary | ICD-10-CM

## 2022-04-14 DIAGNOSIS — R1084 Generalized abdominal pain: Secondary | ICD-10-CM

## 2022-04-14 MED ORDER — ONDANSETRON HCL 4 MG PO TABS: 4.0000 mg | ORAL_TABLET | Freq: Three times a day (TID) | ORAL | 1 refills | Status: DC | PRN

## 2022-04-14 NOTE — Telephone Encounter (Signed)
Pt. Calling said that on last visit with NP Volanda Napoleon was going to give med for nausea but [facility] was [facility] [place]

## 2022-04-14 NOTE — Telephone Encounter (Signed)
I thought he was going to go to the ED but yes I can send something in. Is he doing any better? He needs a visit with Dr. Lamonte Sakai first available

## 2022-04-15 NOTE — Telephone Encounter (Signed)
Called and spoke with patient. He is aware of the Zofran. He stated that he is feeling better.   Nothing further needed at time of call.

## 2022-04-17 ENCOUNTER — Ambulatory Visit (INDEPENDENT_AMBULATORY_CARE_PROVIDER_SITE_OTHER): Payer: 59 | Admitting: Licensed Clinical Social Worker

## 2022-04-17 DIAGNOSIS — F419 Anxiety disorder, unspecified: Secondary | ICD-10-CM

## 2022-04-17 DIAGNOSIS — F32A Depression, unspecified: Secondary | ICD-10-CM

## 2022-04-17 NOTE — BH Specialist Note (Signed)
Integrated Behavioral Health Initial In-Person Visit  MRN: [id number] Name: [name]  Number of [facility] Clinician visits: 1- Initial Visit  Session Start time: 1500    Session End time: [id number]  Total time in minutes: 33   Types of Service: Individual psychotherapy and General Behavioral Integrated Care (BHI)  Interpretor:No. Interpretor Name and Language: N/A   Warm Hand Off Completed.        Subjective: [name] is a 67 y.o. male  Patient was referred by Charise Killian for Depression and V.A assistance. Patient reports the following symptoms/concerns: Depression and sadness due to health. Patients needs assistance with VA application.  Duration of problem: Years; Severity of problem: moderate  Objective: Mood: NA and Affect: Appropriate Risk of harm to self or others: No plan to harm self or others   Patient and/or Family's Strengths/Protective Factors: Social connections  Goals Addressed: Patient will: Reduce symptoms of: depression   Progress towards Goals: Ongoing  Interventions: Interventions utilized: Motivational Interviewing, Solution-Focused Strategies, and Supportive Counseling  Standardized Assessments completed: PHQ-SADS     03/26/2022    5:06 PM 08/20/2021    3:49 PM 07/18/2021    3:51 PM  PHQ-SADS Last 3 Score only  PHQ Adolescent Score '6 11 3     '$ Assessment: Patient currently experiencing sadness due to health. [facility] introduced self and informed patient of confidentiality. Patient denied suicidal ideations or wanting to harm others. [facility] and patient discussed reasoning for feeling sad and a plan on what to due when sadness happens. Patient agreed to listen to music- Rosanne Sack and patient also enjoys [employer] [facility] and patient will continue to work on a plan.  Patient needed assistance with VA benefits. [facility] placed a referral for patient at [email or web address]   Patient may benefit from Motivational  Interviewing, Solution-Focused Strategies, and Supportive Counseling .  Plan: Follow up with behavioral health clinician on : 04/04   Milus Height, MSW, [place]  [facility] Direct Dial:[phone]  Fax [phone] Main Office Phone: [phone] [place], [place] [address] Website: [place], [place]

## 2022-04-22 DIAGNOSIS — R29898 Other symptoms and signs involving the musculoskeletal system: Secondary | ICD-10-CM | POA: Diagnosis not present

## 2022-04-22 DIAGNOSIS — M7918 Myalgia, other site: Secondary | ICD-10-CM | POA: Diagnosis not present

## 2022-04-22 DIAGNOSIS — G8929 Other chronic pain: Secondary | ICD-10-CM | POA: Diagnosis not present

## 2022-04-22 DIAGNOSIS — Z7409 Other reduced mobility: Secondary | ICD-10-CM | POA: Diagnosis not present

## 2022-04-22 DIAGNOSIS — M4726 Other spondylosis with radiculopathy, lumbar region: Secondary | ICD-10-CM | POA: Diagnosis not present

## 2022-04-23 ENCOUNTER — Encounter (HOSPITAL_COMMUNITY): Payer: 59

## 2022-04-25 DIAGNOSIS — G8929 Other chronic pain: Secondary | ICD-10-CM | POA: Diagnosis not present

## 2022-04-25 DIAGNOSIS — R29898 Other symptoms and signs involving the musculoskeletal system: Secondary | ICD-10-CM | POA: Diagnosis not present

## 2022-04-25 DIAGNOSIS — M7918 Myalgia, other site: Secondary | ICD-10-CM | POA: Diagnosis not present

## 2022-04-25 DIAGNOSIS — Z7409 Other reduced mobility: Secondary | ICD-10-CM | POA: Diagnosis not present

## 2022-04-25 DIAGNOSIS — M4726 Other spondylosis with radiculopathy, lumbar region: Secondary | ICD-10-CM | POA: Diagnosis not present

## 2022-04-25 DIAGNOSIS — J441 Chronic obstructive pulmonary disease with (acute) exacerbation: Secondary | ICD-10-CM | POA: Diagnosis not present

## 2022-04-29 DIAGNOSIS — M7918 Myalgia, other site: Secondary | ICD-10-CM | POA: Diagnosis not present

## 2022-04-29 DIAGNOSIS — Z7409 Other reduced mobility: Secondary | ICD-10-CM | POA: Diagnosis not present

## 2022-04-29 DIAGNOSIS — R29898 Other symptoms and signs involving the musculoskeletal system: Secondary | ICD-10-CM | POA: Diagnosis not present

## 2022-04-29 DIAGNOSIS — G8929 Other chronic pain: Secondary | ICD-10-CM | POA: Diagnosis not present

## 2022-04-29 DIAGNOSIS — M4726 Other spondylosis with radiculopathy, lumbar region: Secondary | ICD-10-CM | POA: Diagnosis not present

## 2022-05-08 ENCOUNTER — Telehealth: Payer: Self-pay | Admitting: *Deleted

## 2022-05-08 ENCOUNTER — Ambulatory Visit (INDEPENDENT_AMBULATORY_CARE_PROVIDER_SITE_OTHER): Payer: 59 | Admitting: Licensed Clinical Social Worker

## 2022-05-08 DIAGNOSIS — F32A Depression, unspecified: Secondary | ICD-10-CM

## 2022-05-08 DIAGNOSIS — F419 Anxiety disorder, unspecified: Secondary | ICD-10-CM

## 2022-05-08 NOTE — Telephone Encounter (Signed)
Patient here for an appointment with [facility].  Patient states is having problems with his COPD.  When ever his COPD flares up he has problems with his bowels.  C/O abdominal pain with some Diarrhea.  Has Nausea but no vomiting.    Has been on Mucinex through his Pulmonary doctor.  Has been on  2 antibiotics in the last 5 months. Is currently on 2 liters of Oxygen per Nasal Canula. Has had problem for about 5 months.with his stomach.  Suggestion of going to the ER. Patient states would rather wait fr an appointment.  No appointments were available this afternoon in the Clinics as it is 4:00 PM.  Dr. Johnney Ou spoke briefly with patient.  Patient was given an appointment for next Friday 05/16/2022 at 10:45 AM.  Was advised to call if problems or go to the ER.  Patient states will take appointment and to call his Pulmonary doctor.

## 2022-05-09 DIAGNOSIS — M7918 Myalgia, other site: Secondary | ICD-10-CM | POA: Diagnosis not present

## 2022-05-09 DIAGNOSIS — M47816 Spondylosis without myelopathy or radiculopathy, lumbar region: Secondary | ICD-10-CM | POA: Diagnosis not present

## 2022-05-09 DIAGNOSIS — R29898 Other symptoms and signs involving the musculoskeletal system: Secondary | ICD-10-CM | POA: Diagnosis not present

## 2022-05-09 DIAGNOSIS — Z7409 Other reduced mobility: Secondary | ICD-10-CM | POA: Diagnosis not present

## 2022-05-09 DIAGNOSIS — M5416 Radiculopathy, lumbar region: Secondary | ICD-10-CM | POA: Diagnosis not present

## 2022-05-09 DIAGNOSIS — M4726 Other spondylosis with radiculopathy, lumbar region: Secondary | ICD-10-CM | POA: Diagnosis not present

## 2022-05-09 DIAGNOSIS — G8929 Other chronic pain: Secondary | ICD-10-CM | POA: Diagnosis not present

## 2022-05-11 ENCOUNTER — Other Ambulatory Visit: Payer: Self-pay | Admitting: Emergency Medicine

## 2022-05-12 ENCOUNTER — Telehealth: Payer: Self-pay | Admitting: Primary Care

## 2022-05-12 ENCOUNTER — Encounter (HOSPITAL_COMMUNITY)
Admission: RE | Admit: 2022-05-12 | Discharge: 2022-05-12 | Disposition: A | Discharge status: Home / Self Care | Payer: 59 | Source: Ambulatory Visit | Attending: Gastroenterology | Admitting: Gastroenterology

## 2022-05-12 DIAGNOSIS — R194 Change in bowel habit: Secondary | ICD-10-CM | POA: Diagnosis not present

## 2022-05-12 DIAGNOSIS — K529 Noninfective gastroenteritis and colitis, unspecified: Secondary | ICD-10-CM | POA: Diagnosis not present

## 2022-05-12 DIAGNOSIS — R110 Nausea: Secondary | ICD-10-CM | POA: Insufficient documentation

## 2022-05-12 MED ORDER — TECHNETIUM TC 99M SULFUR COLLOID
1.9000 | Freq: Once | INTRAVENOUS | Status: AC
  Administered 2022-05-12: 1.9 via INTRAVENOUS

## 2022-05-12 NOTE — Telephone Encounter (Signed)
Patient states needs Prednisone changed to two a day. Patient out of medication. Pharmacy is [facility]. Patient phone number is [phone].

## 2022-05-13 NOTE — Telephone Encounter (Signed)
Pt called office again after not hearing anyone about the prednisone prescription.   Pt states that he wants to know if the instructions can be changed for him to take 20mg  prednisone daily instead of 10mg  daily as 20mg  works better for him than the 10mg .  Pt said that he is out of the medication and needs to have a new prescription sent.   Dr. Delton Coombes, please advise.

## 2022-05-13 NOTE — Telephone Encounter (Signed)
Yes ok to increase to 20mg  qd He will need a f/u w APP or RB in 1 month to assess, see if we can decrease dose maybe to 15mg 

## 2022-05-14 MED ORDER — PREDNISONE 10 MG PO TABS: 20.0000 mg | ORAL_TABLET | Freq: Every day | ORAL | 0 refills | Status: DC

## 2022-05-14 NOTE — Telephone Encounter (Signed)
ATC X1 LVM for patient to call the office back. Please verify pharmacy and schedule 1 month f/u with [place] or APP

## 2022-05-14 NOTE — Telephone Encounter (Signed)
Spoke to pt and informed him med change of prednisone 10 MG increased to 20 MG is okay to take. Sent in refill to pharmacy that instructs to take 2 (two) 10 MG daily for 30 days. Pt verbalized understanding nothing further needed.
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
## 2022-05-15 ENCOUNTER — Other Ambulatory Visit: Payer: Self-pay | Admitting: Primary Care

## 2022-05-16 ENCOUNTER — Ambulatory Visit (INDEPENDENT_AMBULATORY_CARE_PROVIDER_SITE_OTHER): Payer: 59 | Admitting: Student

## 2022-05-16 VITALS — BP 132/90 | HR 70 | Temp 97.7°F | Wt 168.3 lb

## 2022-05-16 DIAGNOSIS — R1084 Generalized abdominal pain: Secondary | ICD-10-CM | POA: Diagnosis not present

## 2022-05-16 DIAGNOSIS — R5381 Other malaise: Secondary | ICD-10-CM

## 2022-05-16 DIAGNOSIS — R110 Nausea: Secondary | ICD-10-CM

## 2022-05-16 NOTE — Patient Instructions (Signed)
Thank you, [name] for allowing [place] to provide your care today. Today we discussed your PCS application, abdominal pain and lung problems.  Your gastric emptying study shows that you have gastroparesis. Please follow-up with GI on Monday for treatment of this.  For your congestion, I recommend you add over-the-counter saline spray and nasal rinse to the regiment you were put on by pulmonology.  Follow-up with them as needed.  I am completing your PCS application so it can be sent to insurance company for approval.  My Chart Access: [email or web address]  Please follow-up as needed  Please make sure to arrive 15 minutes prior to your next appointment. If you arrive late, you may be asked to reschedule.    We look forward to seeing you next time. Please call our clinic at [phone] if you have any questions or concerns. The best time to call is Monday-Friday from 9am-4pm, but there is someone available 24/7. If after hours or the weekend, call the main hospital number and ask for the Internal Medicine Resident On-Call. If you need medication refills, please notify your pharmacy one week in advance and they will send [place] a request.   Thank you for letting us take part in your care. Wishing you the best!  Steffanie Rainwater, MD 05/16/2022, 11:45 AM IM Resident, PGY-3 [name] 41:10
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
## 2022-05-18 ENCOUNTER — Encounter: Payer: Self-pay | Admitting: Student

## 2022-05-18 MED ORDER — SIMETHICONE 125 MG PO TABS: 125.0000 mg | ORAL_TABLET | Freq: Three times a day (TID) | ORAL | 2 refills | Status: DC | PRN

## 2022-05-18 NOTE — Assessment & Plan Note (Signed)
[name] has had gradual decline in his health over the last few years. He has chronic pain, respiratory failure and GI symptoms that limits his ability to perform his ADLs effectively. He has been working with PT to help improve his strength and mobility but he continues to have difficulty standing to cook for himself due to pain. He will benefit from a personal care service to help with his ADLs and improve his quality of life.  -PCS application completed in clinic today.

## 2022-05-18 NOTE — Assessment & Plan Note (Signed)
[name] is here for follow up on his chronic abdominal symptoms. He has a diagnosis of chronic colitis and IBS-D currently followed by GI. He presents today stating he continues to have abdominal soreness, bloating, nausea and occasional diarrhea. He often feels sick to his stomach when he eats. He is currently taking Mesalamine, bentyl and zofran for these symptoms but they continue to bother him. He followed up with GI 7 weeks ago and there was a concern for his increased risk of SIBO in the setting of his cyclic antibiotics (for his respiratory problems). He needs to be off antibiotics for at least 3 weeks before they can pursue a SIBO breath test. He was scheduled for a gastric emptying test 1 week ago and it was positive for gastroparesis. He is scheduled to see GI on Monday to initiate treatment. I advised patient continue his current medications and to follow up with GI.   Plan: -Follow up with GI on Monday -Continue mesalamine, bentyl and zofran -Refill simethicone -He will need periodic EKGs to monitor his QTC if started on Metoclopramide

## 2022-05-19 ENCOUNTER — Other Ambulatory Visit: Payer: 59

## 2022-05-19 ENCOUNTER — Ambulatory Visit (INDEPENDENT_AMBULATORY_CARE_PROVIDER_SITE_OTHER): Payer: 59 | Admitting: Physician Assistant

## 2022-05-19 ENCOUNTER — Ambulatory Visit (HOSPITAL_COMMUNITY)
Admission: RE | Admit: 2022-05-19 | Discharge: 2022-05-19 | Disposition: A | Discharge status: Home / Self Care | Payer: 59 | Source: Ambulatory Visit | Attending: Emergency Medicine | Admitting: Emergency Medicine

## 2022-05-19 ENCOUNTER — Encounter: Payer: Self-pay | Admitting: Physician Assistant

## 2022-05-19 VITALS — BP 110/70 | HR 78 | Ht 69.0 in | Wt 167.1 lb

## 2022-05-19 DIAGNOSIS — K582 Mixed irritable bowel syndrome: Secondary | ICD-10-CM | POA: Diagnosis not present

## 2022-05-19 DIAGNOSIS — R159 Full incontinence of feces: Secondary | ICD-10-CM

## 2022-05-19 DIAGNOSIS — K3184 Gastroparesis: Secondary | ICD-10-CM | POA: Diagnosis not present

## 2022-05-19 DIAGNOSIS — Z01818 Encounter for other preprocedural examination: Secondary | ICD-10-CM | POA: Diagnosis not present

## 2022-05-19 DIAGNOSIS — R110 Nausea: Secondary | ICD-10-CM

## 2022-05-19 DIAGNOSIS — R918 Other nonspecific abnormal finding of lung field: Secondary | ICD-10-CM | POA: Diagnosis not present

## 2022-05-19 DIAGNOSIS — R1084 Generalized abdominal pain: Secondary | ICD-10-CM

## 2022-05-19 DIAGNOSIS — R32 Unspecified urinary incontinence: Secondary | ICD-10-CM

## 2022-05-19 DIAGNOSIS — K638219 Small intestinal bacterial overgrowth, unspecified: Secondary | ICD-10-CM

## 2022-05-19 DIAGNOSIS — K529 Noninfective gastroenteritis and colitis, unspecified: Secondary | ICD-10-CM

## 2022-05-19 DIAGNOSIS — J439 Emphysema, unspecified: Secondary | ICD-10-CM | POA: Insufficient documentation

## 2022-05-19 MED ORDER — METOCLOPRAMIDE HCL 5 MG PO TABS: 5.0000 mg | ORAL_TABLET | Freq: Three times a day (TID) | ORAL | 2 refills | Status: DC | PRN

## 2022-05-19 NOTE — Patient Instructions (Addendum)
Your provider has requested that you go to the basement level for lab work before leaving today. Press "B" on the elevator. The lab is located at the first door on the left as you exit the elevator.  Go to your back doctor Go to the ER if you have leg weakness or can not control your stool/urine  Please check to see if you are on mesalamine 1.2 g, two tablets at night  Your provider has requested that you go to the basement level for lab work before leaving today. Press "B" on the elevator. The lab is located at the first door on the left as you exit the elevator.  Follow-up on 09/03/22 at 2:50 pm with [name]    Reglan Continue the medication as needed up to 3 times a day, on an empty stomach 30 minutes before eating. It may take a few weeks for your stomach condition to start to get better. However, do not take this medicine for longer than 12 weeks.  The longer you take this medicine, and the more you take it, the greater your chances are of developing serious side effects.  Some people may get a severe muscle problem called tardive dyskinesia. This problem may lessen or go away after stopping this drug, but it may not go away. The risk is greater with diabetes and in older adults, especially older females. The risk is greater with longer use or higher doses, but it may also occur after short-term use with low doses. Call your doctor right away if you have trouble controlling body movements or problems with your tongue, face, mouth, or jaw like tongue sticking out, puffing cheeks, mouth puckering, or chewing.    Please monitor for worsening depression or thoughts of suicide, any aggressiveness or hyperactivity.  If this happen please stop and call your physician right away.   Gastroparesis Please do small frequent meals like 4-6 meals a day.  Eat and drink liquids at separate times.  Avoid high fiber foods, cook your vegetables, avoid high fat food.  Suggest spreading protein  throughout the day (greek yogurt, glucerna, soft meat, milk, eggs) Choose soft foods that you can mash with a fork When you are more symptomatic, change to pureed foods foods and liquids.  Consider reading "Living well with Gastroparesis" by Reuel Derby Reglan or metoclopramide  Can be used for gastroparesis or slow stomach, nausea, vomiting, GERD.  Gastroparesis is a condition in which food takes longer than normal to empty from the stomach. This condition is also known as delayed gastric emptying. It is usually a long-term (chronic) condition. There is no cure, but there are treatments and things that you can do at home to help relieve symptoms. Treating the underlying condition that causes gastroparesis can also help relieve symptoms What are the causes? In many cases, the cause of this condition is not known. Possible causes include: A hormone (endocrine) disorder, such as hypothyroidism or diabetes. A nervous system disease, such as Parkinson's disease or multiple sclerosis. Cancer, infection, or surgery that affects the stomach or vagus nerve. The vagus nerve runs from your chest, through your neck, and to the lower part of your brain. A connective tissue disorder, such as scleroderma. Certain medicines. What increases the risk? You are more likely to develop this condition if: You have certain disorders or diseases. These may include: An endocrine disorder. An eating disorder. Amyloidosis. Scleroderma. Parkinson's disease. Multiple sclerosis. Cancer or infection of the stomach or the vagus nerve. You have  had surgery on your stomach or vagus nerve. You take certain medicines. You are male. What are the signs or symptoms? Symptoms of this condition include: Feeling full after eating very little or a loss of appetite. Nausea, vomiting, or heartburn. Bloating of your abdomen. Inconsistent blood sugar (glucose) levels on blood tests. Unexplained weight loss. Acid from  the stomach coming up into the esophagus (gastroesophageal reflux). Sudden tightening (spasm) of the stomach, which can be painful. Symptoms may come and go. Some people may not notice any symptoms. How is this diagnosed? This condition is diagnosed with tests, such as: Tests that check how long it takes food to move through the stomach and intestines. These tests include: Upper gastrointestinal (GI) series. For this test, you drink a liquid that shows up well on X-rays, and then X-rays are taken of your intestines. Gastric emptying scintigraphy. For this test, you eat food that contains a small amount of radioactive material, and then scans are taken. Wireless capsule GI monitoring system. For this test, you swallow a pill (capsule) that records information about how foods and fluid move through your stomach. Gastric manometry. For this test, a tube is passed down your throat and into your stomach to measure electrical and muscular activity. Endoscopy. For this test, a long, thin tube with a camera and light on the end is passed down your throat and into your stomach to check for problems in your stomach lining. Ultrasound. This test uses sound waves to create images of the inside of your body. This can help rule out gallbladder disease or pancreatitis as a cause of your symptoms. How is this treated? There is no cure for this condition, but treatment and home care may relieve symptoms. Treatment may include: Treating the underlying cause. Managing your symptoms by making changes to your diet and exercise habits. Taking medicines to control nausea and vomiting and to stimulate stomach muscles. Getting food through a feeding tube in the hospital. This may be done in severe cases. Having surgery to insert a device called a gastric electrical stimulator into your body. This device helps improve stomach emptying and control nausea and vomiting. Follow these instructions at home: Take  over-the-counter and prescription medicines only as told by your health care provider. Follow instructions from your health care provider about eating or drinking restrictions. Your health care provider may recommend that you: Eat smaller meals more often. Eat low-fat foods. Eat low-fiber forms of high-fiber foods. For example, eat cooked vegetables instead of raw vegetables. Have only liquid foods instead of solid foods. Liquid foods are easier to digest. Drink enough fluid to keep your urine pale yellow. Exercise as often as told by your health care provider. Keep all follow-up visits. This is important. Contact a health care provider if you: Notice that your symptoms do not improve with treatment. Have new symptoms. Get help right away if you: Have severe pain in your abdomen that does not improve with treatment. Have nausea that is severe or does not go away. Vomit every time you drink fluids. Summary Gastroparesis is a long-term (chronic) condition in which food takes longer than normal to empty from the stomach. Symptoms include nausea, vomiting, heartburn, bloating of your abdomen, and loss of appetite. Eating smaller portions, low-fat foods, and low-fiber forms of high-fiber foods may help you manage your symptoms. Get help right away if you have severe pain in your abdomen. This information is not intended to replace advice given to you by your health care provider.  Make sure you discuss any questions you have with your health care provider. Document Revised: 05/30/2019 Document Reviewed: 05/30/2019 Elsevier Patient Education  2021 [employer].

## 2022-05-19 NOTE — Progress Notes (Signed)
Attending Physician's Attestation   I have reviewed the chart.   I agree with the Advanced Practitioner's note, impression, and recommendations with any updates as below. I think it makes sense to try the Reglan if he is willing and monitor closely.  Hopefully he will bring back the fecal calprotectin soon.   Corliss Parish, MD  Gastroenterology [facility] Office # [phone]

## 2022-05-19 NOTE — Progress Notes (Signed)
Internal Medicine Clinic Attending  Case discussed with Dr. [place]  At the time of the visit.  We reviewed the resident's history and exam and pertinent patient test results.  I agree with the assessment, diagnosis, and plan of care documented in the resident's note.  

## 2022-05-23 ENCOUNTER — Telehealth: Payer: Self-pay | Admitting: *Deleted

## 2022-05-23 NOTE — Telephone Encounter (Signed)
PCS form faxed to [employer] ([employer] ) for review / scheduling of assessment. [phone] / FAX [phone]. Confirmation time 11:37 am.  Spoke with patient to inform him of his request had been faxed and that he would be receiving a phone call from medicaid to set up appointment. Phone number given to patient to follow up if have not gotten call by 2 weeks.

## 2022-05-24 ENCOUNTER — Other Ambulatory Visit: Payer: Self-pay | Admitting: Emergency Medicine

## 2022-05-26 ENCOUNTER — Encounter: Payer: 59 | Admitting: Student

## 2022-05-26 DIAGNOSIS — M7918 Myalgia, other site: Secondary | ICD-10-CM | POA: Diagnosis not present

## 2022-05-26 DIAGNOSIS — J441 Chronic obstructive pulmonary disease with (acute) exacerbation: Secondary | ICD-10-CM | POA: Diagnosis not present

## 2022-05-26 DIAGNOSIS — M47816 Spondylosis without myelopathy or radiculopathy, lumbar region: Secondary | ICD-10-CM | POA: Diagnosis not present

## 2022-05-26 DIAGNOSIS — M5416 Radiculopathy, lumbar region: Secondary | ICD-10-CM | POA: Diagnosis not present

## 2022-05-26 DIAGNOSIS — R29898 Other symptoms and signs involving the musculoskeletal system: Secondary | ICD-10-CM | POA: Diagnosis not present

## 2022-05-26 DIAGNOSIS — Z7409 Other reduced mobility: Secondary | ICD-10-CM | POA: Diagnosis not present

## 2022-05-26 DIAGNOSIS — G8929 Other chronic pain: Secondary | ICD-10-CM | POA: Diagnosis not present

## 2022-05-26 DIAGNOSIS — M4726 Other spondylosis with radiculopathy, lumbar region: Secondary | ICD-10-CM | POA: Diagnosis not present

## 2022-05-26 NOTE — BH Specialist Note (Signed)
Integrated Behavioral Health Follow Up In-Person Visit  MRN: [id number] Name: [name]  Number of Integrated Behavioral Health Clinician visits: 2- Second Visit  Session Start time: 1500   Session End time: 1600  Total time in minutes: 60   Types of Service: Individual psychotherapy and General Behavioral Integrated Care (BHI)   Interpretor:No. Interpretor Name and Language: N/A     Warm Hand Off Completed.             Subjective: [name] is a 68 y.o. male  Patient was referred by Dickie La for Depression and V.A assistance. Patient reports the following symptoms/concerns: Depression and sadness due to health. Patients needs assistance with VA application.  Duration of problem: Years; Severity of problem: moderate   Objective: Mood: NA and Affect: Appropriate Risk of harm to self or others: No plan to harm self or others     Patient and/or Family's Strengths/Protective Factors: Social connections   Goals Addressed: Patient will: Reduce symptoms of: depression     Progress towards Goals: Ongoing   Interventions: Interventions utilized: Motivational Interviewing, Solution-Focused Strategies, and Supportive Counseling  Standardized Assessments completed: PHQ-SADS       03/26/2022    5:06 PM 08/20/2021    3:49 PM 07/18/2021    3:51 PM  PHQ-SADS Last 3 Score only  PHQ Adolescent Score Assessment: Patient experiencing burning In stomach and incompetence issues. Patient reported concerns to Nurse triage and PCP. [facility] supplied incompentence supplies to patient.   Patient continues to listen to jazz music and practice self care.  Patient will continue to work with VA office to establish VA benefits.   Patient may benefit from Motivational Interviewing, Solution-Focused Strategies, and Supportive Counseling .   Plan: Follow up with behavioral health clinician on : within 30 days.      Christen Butter, MSW, LCSW-A She/Her Behavioral  Health Clinician [facility]  Internal Medicine Center Direct [phone]  Fax [phone] Main Office Phone: [phone] [address] [place] [place], [place] [address] Website: [facility] Internal [facility]  [place], [place]  [place]

## 2022-05-28 ENCOUNTER — Ambulatory Visit: Payer: 59 | Admitting: Primary Care

## 2022-05-28 ENCOUNTER — Ambulatory Visit (INDEPENDENT_AMBULATORY_CARE_PROVIDER_SITE_OTHER): Payer: 59 | Admitting: Emergency Medicine

## 2022-05-28 ENCOUNTER — Encounter: Payer: Self-pay | Admitting: Emergency Medicine

## 2022-05-28 VITALS — BP 124/76 | HR 70 | Temp 97.9°F | Ht 69.0 in | Wt 167.2 lb

## 2022-05-28 DIAGNOSIS — C3490 Malignant neoplasm of unspecified part of unspecified bronchus or lung: Secondary | ICD-10-CM | POA: Diagnosis not present

## 2022-05-28 DIAGNOSIS — K219 Gastro-esophageal reflux disease without esophagitis: Secondary | ICD-10-CM | POA: Diagnosis not present

## 2022-05-28 DIAGNOSIS — J432 Centrilobular emphysema: Secondary | ICD-10-CM | POA: Diagnosis not present

## 2022-05-28 DIAGNOSIS — J301 Allergic rhinitis due to pollen: Secondary | ICD-10-CM

## 2022-05-28 DIAGNOSIS — J9611 Chronic respiratory failure with hypoxia: Secondary | ICD-10-CM

## 2022-05-28 MED ORDER — AZITHROMYCIN 250 MG PO TABS: 250.0000 mg | ORAL_TABLET | Freq: Every day | ORAL | 3 refills | Status: DC

## 2022-05-28 MED ORDER — DOXYCYCLINE HYCLATE 100 MG PO TABS: 100.0000 mg | ORAL_TABLET | Freq: Two times a day (BID) | ORAL | 3 refills | Status: DC

## 2022-05-28 MED ORDER — CEFUROXIME AXETIL 250 MG PO TABS: 250.0000 mg | ORAL_TABLET | Freq: Two times a day (BID) | ORAL | 3 refills | Status: DC

## 2022-05-28 MED ORDER — PREDNISONE 10 MG PO TABS: 20.0000 mg | ORAL_TABLET | Freq: Every day | ORAL | 1 refills | Status: DC

## 2022-05-28 NOTE — Progress Notes (Signed)
Subjective:    Patient ID: [name], male    DOB: 1955/11/12, 67 y.o.   MRN: [id number]  COPD He complains of cough and shortness of breath. There is no wheezing. Pertinent negatives include no ear pain, fever, headaches, postnasal drip, rhinorrhea, sneezing, sore throat or trouble swallowing. His past medical history is significant for COPD.   HPI     ROV 05/28/2022 --Mr. Bogdan is 10, followed for very severe COPD, bilateral surgical resections for adenocarcinomas.  He has severe chronic bronchitic symptoms, has been managed on Trelegy, prednisone with current dose of 20 mg daily.  I had him on rotating azithromycin/cefuroxime/doxycycline, had planned to get him restarted on this in October 2023.  He is on oxygen at 2 L/min.  Finally, we have been following multiple pulmonary nodules that wax and wane, consistent with inflammatory process.  They were decreased in size on the CT chest 11/2021, repeat scan done 05/19/2022 as below.  Currently managed on Trelegy albuterol.  Flonase, Allegra, Prilosec 40 mg daily, Reglan He reports today that he has been dealing with persistent cough and bronchitic symptoms. Also with progressive exertional SOB. He has had incontinence with exertion.  He was given a prescription for Augmentin 04/11/2022. I increased his pred to  4/8  CT scan of the chest 05/19/22 reviewed by me shows no hilar or mediastinal adenopathy, centrilobular and paraseptal emphysema, surgical scar in the medial right upper lobe and left hilar regions.  There is been a decrease in size of linear left upper lobe nodule, resolution of most of the pre-existing nodules, overall improved                                                                                                                                          Review of Systems  Constitutional:  Negative for fever and unexpected weight change.  HENT:  Positive for congestion. Negative for dental problem, ear pain, nosebleeds,  postnasal drip, rhinorrhea, sinus pressure, sneezing, sore throat and trouble swallowing.   Eyes:  Negative for redness and itching.  Respiratory:  Positive for cough and shortness of breath. Negative for chest tightness and wheezing.   Cardiovascular:  Positive for palpitations. Negative for leg swelling.  Gastrointestinal:  Negative for nausea and vomiting.  Genitourinary:  Negative for dysuria.  Musculoskeletal:  Negative for joint swelling.  Skin:  Negative for rash.  Neurological:  Negative for headaches.  Hematological:  Does not bruise/bleed easily.  Psychiatric/Behavioral:  Positive for dysphoric mood. The patient is nervous/anxious.        Objective:   Physical Exam  Vitals:   05/28/22 1535  BP: 124/76  Pulse: 70  Temp: 97.9 F (36.6 C)  TempSrc: Oral  SpO2: 92%  Weight: 167 lb 3.2 oz (75.8 kg)  Height:  (1.753 m)   Gen: Pleasant, well-nourished, in no distress, somewhat depressed affect  ENT: No  lesions,  mouth clear,  oropharynx clear, no postnasal drip  Neck: No JVD, no stridor  Lungs: No use of accessory muscles, clear without rales or rhonchi, no wheeze  Cardiovascular: RRR, heart sounds normal, no murmur or gallops, no peripheral edema  Musculoskeletal: no deformities   Neuro: alert, non focal  Skin: Warm, no lesions or rashes     Assessment & Plan:  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  I reordered his rotating antibiotics last October but he never got back on them.  He has had flaring symptoms, increased his prednisone from 10 mg to 20 mg daily about 3 weeks ago with some impact and improvement.  I would like to get him back down to a lower dose if possible.  I will add back his rotating antibiotics now, continue the prednisone 20 mg and hopefully decrease to 15 mg at his next visit if he is improved.  Continue his Mucinex.  Continue Trelegy 1 inhalation once daily.  Rinse and gargle after using. Continue  albuterol 2 puffs when needed for shortness of breath, chest tightness, wheezing. Continue prednisone 20 mg once daily (two 10 mg tablets).  Depending on how you are doing going forward we will try to decrease this dose slightly Restart your monthly rotating antibiotics, to be taken the same week each month: -Doxycycline 100 mg twice a day for 7 days -Cefuroxime 250 mg twice a day for 7 days -Azithromycin 250 mg once daily for 5 days -Then go back to doxycycline for the next month, etc.  Lung cancer (HCC) Most recent CT chest reassuring with resolution of pulmonary nodules.  Next scan in 05/2023.  Laryngopharyngeal reflux (LPR) Continue omeprazole  Chronic respiratory failure with hypoxia (HCC) Continue oxygen 2-3 L/min.  He has not seen any desaturations.  Allergic rhinitis Continue Allegra, Flonase    Levy Pupa, MD, PhD 05/28/2022, 4:26 PM [facility] [phone] or if no answer [phone]

## 2022-05-28 NOTE — Assessment & Plan Note (Signed)
>>  ASSESSMENT AND PLAN FOR LARYNGOPHARYNGEAL REFLUX (LPR) WRITTEN ON 05/28/2022  4:23 PM BY BYRUM, LAMAR RAMAN, MD  Continue omeprazole

## 2022-05-28 NOTE — Assessment & Plan Note (Signed)
Continue oxygen 2-3 L/min.  He has not seen any desaturations.

## 2022-05-28 NOTE — Assessment & Plan Note (Signed)
Continue Allegra, Flonase

## 2022-05-28 NOTE — Assessment & Plan Note (Addendum)
Most recent CT chest reassuring with resolution of pulmonary nodules.  Next scan in 05/2023.

## 2022-05-28 NOTE — Assessment & Plan Note (Addendum)
>>  ASSESSMENT AND PLAN FOR COPD WITH EMPHYSEMA (HCC) WRITTEN ON 05/28/2022  4:25 PM BY Sequita Wise S, MD  Severe COPD with chronic bronchitic symptoms.  I reordered his rotating antibiotics last October but he never got back on them.  He has had flaring symptoms, increased his prednisone from 10 mg to 20 mg daily about 3 weeks ago with some impact and improvement.  I would like to get him back down to a lower dose if possible.  I will add back his rotating antibiotics now, continue the prednisone 20 mg and hopefully decrease to 15 mg at his next visit if he is improved.  Continue his Mucinex.  Continue Trelegy 1 inhalation once daily.  Rinse and gargle after using. Continue albuterol 2 puffs when needed for shortness of breath, chest tightness, wheezing. Continue prednisone 20 mg once daily (two 10 mg tablets).  Depending on how you are doing going forward we will try to decrease this dose slightly Restart your monthly rotating antibiotics, to be taken the same week each month: -Doxycycline 100 mg twice a day for 7 days -Cefuroxime 250 mg twice a day for 7 days -Azithromycin 250 mg once daily for 5 days -Then go back to doxycycline for the next month, etc.  >>ASSESSMENT AND PLAN FOR CHRONIC RESPIRATORY FAILURE WITH HYPOXIA (HCC) WRITTEN ON 05/28/2022  4:23 PM BY Velia Pamer, Les Pou, MD  Continue oxygen 2-3 L/min.  He has not seen any desaturations.

## 2022-05-28 NOTE — Patient Instructions (Addendum)
Continue Trelegy 1 inhalation once daily.  Rinse and gargle after using. Continue albuterol 2 puffs when needed for shortness of breath, chest tightness, wheezing. Continue prednisone 20 mg once daily (two 10 mg tablets).  Depending on how you are doing going forward we will try to decrease this dose slightly Restart your monthly rotating antibiotics, to be taken the same week each month: -Doxycycline 100 mg twice a day for 7 days -Cefuroxime 250 mg twice a day for 7 days -Azithromycin 250 mg once daily for 5 days -Then go back to doxycycline for the next month, etc. Continue your omeprazole as you have been taking it Continue Flonase and Allegra as you have been taking them Continue your oxygen at 2-3 L/min depending on your level of exertion We will plan to repeat your CT scan of the chest in April 2025 Follow with APP or Dr. Delton Coombes in 2 months to assess your status on these medications

## 2022-05-28 NOTE — Assessment & Plan Note (Signed)
Continue omeprazole 
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
## 2022-05-29 ENCOUNTER — Telehealth: Payer: Self-pay | Admitting: Nurse Practitioner

## 2022-05-29 NOTE — Telephone Encounter (Signed)
05/29/2022 After hours call: Pt called for clarification on his rotating monthly antibiotics. He was confused as to when he was supposed to start his antibiotics and how exactly to take them. Instructed him to go ahead and start the doxycycline twice a day for 7 days. He will stop after the 7 days and then start his cefuroxime on 5/25 Twice daily for 7 days then stop. He will then start the azithromycin 6/25 once a day for 5 days then stop. On 7/25, he will restart with the doxycycline again and continue the above cycle. He will wear sunscreen/protect his skin from the sun while on doxycycline. He will take his antibiotics with food. Verbalized understanding. No further questions. Advised him to call back if he needed any more assistance.

## 2022-06-03 DIAGNOSIS — R29898 Other symptoms and signs involving the musculoskeletal system: Secondary | ICD-10-CM | POA: Diagnosis not present

## 2022-06-03 DIAGNOSIS — Z7409 Other reduced mobility: Secondary | ICD-10-CM | POA: Diagnosis not present

## 2022-06-03 DIAGNOSIS — M7918 Myalgia, other site: Secondary | ICD-10-CM | POA: Diagnosis not present

## 2022-06-03 DIAGNOSIS — M5416 Radiculopathy, lumbar region: Secondary | ICD-10-CM | POA: Diagnosis not present

## 2022-06-03 DIAGNOSIS — G8929 Other chronic pain: Secondary | ICD-10-CM | POA: Diagnosis not present

## 2022-06-03 DIAGNOSIS — M4726 Other spondylosis with radiculopathy, lumbar region: Secondary | ICD-10-CM | POA: Diagnosis not present

## 2022-06-03 DIAGNOSIS — M47816 Spondylosis without myelopathy or radiculopathy, lumbar region: Secondary | ICD-10-CM | POA: Diagnosis not present

## 2022-06-04 ENCOUNTER — Ambulatory Visit (INDEPENDENT_AMBULATORY_CARE_PROVIDER_SITE_OTHER): Payer: 59 | Admitting: Student

## 2022-06-04 ENCOUNTER — Ambulatory Visit: Payer: 59 | Admitting: Primary Care

## 2022-06-04 ENCOUNTER — Encounter: Payer: Self-pay | Admitting: Student

## 2022-06-04 VITALS — BP 117/84 | HR 89 | Ht 69.0 in | Wt 168.0 lb

## 2022-06-04 DIAGNOSIS — E559 Vitamin D deficiency, unspecified: Secondary | ICD-10-CM | POA: Diagnosis not present

## 2022-06-04 DIAGNOSIS — Z79899 Other long term (current) drug therapy: Secondary | ICD-10-CM | POA: Diagnosis not present

## 2022-06-04 DIAGNOSIS — I700 Atherosclerosis of aorta: Secondary | ICD-10-CM | POA: Diagnosis not present

## 2022-06-04 DIAGNOSIS — R350 Frequency of micturition: Secondary | ICD-10-CM | POA: Diagnosis not present

## 2022-06-04 DIAGNOSIS — J9611 Chronic respiratory failure with hypoxia: Secondary | ICD-10-CM | POA: Diagnosis not present

## 2022-06-04 MED ORDER — MYRBETRIQ 50 MG PO TB24: 50.0000 mg | ORAL_TABLET | Freq: Every day | ORAL | 5 refills | Status: DC

## 2022-06-04 NOTE — Assessment & Plan Note (Signed)
Patient currently on a significant amount of medications for his multiple chronic medical problems. His Myrbetriq fell through with outpatient noticing leading to worsening of his urinary frequency. I am referring him again to our clinical pharmacist to help with medication reconciliation -Ambulatory refer to clinical pharmacist
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
## 2022-06-04 NOTE — Assessment & Plan Note (Signed)
Patient with a history of urinary frequency presenting today for worsening symptoms. Patient states over the last 2 months, he has had increased urge with frequent urination. Over the last 2 weeks, he has had to urinate almost every 5 to 10 minutes. He denies any dysuria, hematuria, dribbling or burning with urination.  Reports that this has interfered with his ability to function effectively. Last week, he was unable to complete PT because of this. On chart review, patient was prescribed Myrbetriq by his urologist last year however patient has not filled this over the last 3 months. He also drinks 2 cups of coffee daily and [facility] daily. I counseled patient on lifestyle changes to help decrease his risk of urinary frequency and refill of his Myrbetriq.  Plan: -Refilled Myrbetriq 50 mg daily -Avoid to limit intake of caffeine, carbonated beverages, spicy food and alcohol -Counseled on bladder training with timed void -Follow-up with urology as needed

## 2022-06-04 NOTE — Assessment & Plan Note (Signed)
Patient quit smoking many years ago. He is currently on rosuvastatin 20 mg daily. I counseled him eating healthier meals as well as small frequent meals.

## 2022-06-04 NOTE — Patient Instructions (Addendum)
Thank you, Mr.[name] P Varnell for allowing [place] to provide your care today. Today we discussed your urinary frequency and vitamin D deficiency.    For your urinary frequency, follow the instructions below:  1.  Resume Myrbetriq and follow-up with urology as needed.  2.  Avoid or limit caffeine, carbonated beverages, spicy food, artificial sweeteners, and alcohol.  3. Decrease fluid intake to six to eight glasses of water per day  4. Use your stool softeners and increase her fiber intake to decrease constipation  I have ordered the following labs for you:   Lab Orders         Vitamin D (25 hydroxy)      I will call if any are abnormal. All of your labs can be accessed through "My Chart".  I have ordered the following medication/changed the following medications:  Resume Myrbetriq 50 mg daily  My Chart Access: [email or web address]  Please follow-up in  3 months  Please make sure to arrive 15 minutes prior to your next appointment. If you arrive late, you may be asked to reschedule.    We look forward to seeing you next time. Please call our clinic at [phone] if you have any questions or concerns. The best time to call is Monday-Friday from 9am-4pm, but there is someone available 24/7. If after hours or the weekend, call the main hospital number and ask for the Internal Medicine Resident On-Call. If you need medication refills, please notify your pharmacy one week in advance and they will send [place] a request.   Thank you for letting us take part in your care. Wishing you the best!  Steffanie Rainwater, MD 06/04/2022, 2:38 PM IM Resident, PGY-3 [place] 41:10

## 2022-06-04 NOTE — Assessment & Plan Note (Signed)
He tells me that his oxygen levels dropped to the 80s during his last PT session. His oxygen level is 92% on 2 L in clinic today.  He tells me that he gets headaches when he increase his oxygen levels to 3 L. -Continue on 2 L [place] for O2 goal 88-92% -Follow-up with pulmonology as needed

## 2022-06-04 NOTE — Assessment & Plan Note (Signed)
>>  ASSESSMENT AND PLAN FOR CHRONIC RESPIRATORY FAILURE WITH HYPOXIA (HCC) WRITTEN ON 06/04/2022  3:18 PM BY AMPONSAH, PROSPER M, MD  He tells me that his oxygen levels dropped to the 80s during his last PT session. His oxygen level is 92% on 2 L in clinic today.  He tells me that he gets headaches when he increase his oxygen levels to 3 L. -Continue on 2 L [place] for O2 goal 88-92% -Follow-up with pulmonology as needed

## 2022-06-04 NOTE — Assessment & Plan Note (Signed)
Patient has completed high-dose vitamin D supplementation. -Repeat vitamin D levels  Addendum: Vitamin D levels improved from 6.5 to 19. Discussed the results with patient with plan to initiate daily vitamin D. -Start vitamin D 1000 units daily

## 2022-06-05 LAB — VITAMIN D 25 HYDROXY (VIT D DEFICIENCY, FRACTURES): Vit D, 25-Hydroxy: 19 ng/mL — ABNORMAL LOW (ref 30.0–100.0)

## 2022-06-05 MED ORDER — VITAMIN D 25 MCG (1000 UNIT) PO TABS: 1000.0000 [IU] | ORAL_TABLET | Freq: Every day | ORAL | 1 refills | Status: DC

## 2022-06-05 NOTE — Addendum Note (Signed)
Addended bySharrell Ku on: 06/05/2022 02:03 PM   Modules accepted: Orders

## 2022-06-05 NOTE — Progress Notes (Signed)
His vitamin D level has improved from 6.5 to 19 with the high-dose vitamin D. I am initiating him on daily vitamin D 1000 units daily. Plan has been discussed with patient.

## 2022-06-06 DIAGNOSIS — M4726 Other spondylosis with radiculopathy, lumbar region: Secondary | ICD-10-CM | POA: Diagnosis not present

## 2022-06-06 DIAGNOSIS — R29898 Other symptoms and signs involving the musculoskeletal system: Secondary | ICD-10-CM | POA: Diagnosis not present

## 2022-06-06 DIAGNOSIS — M7918 Myalgia, other site: Secondary | ICD-10-CM | POA: Diagnosis not present

## 2022-06-06 DIAGNOSIS — M47816 Spondylosis without myelopathy or radiculopathy, lumbar region: Secondary | ICD-10-CM | POA: Diagnosis not present

## 2022-06-06 DIAGNOSIS — Z7409 Other reduced mobility: Secondary | ICD-10-CM | POA: Diagnosis not present

## 2022-06-06 DIAGNOSIS — M5416 Radiculopathy, lumbar region: Secondary | ICD-10-CM | POA: Diagnosis not present

## 2022-06-06 DIAGNOSIS — G8929 Other chronic pain: Secondary | ICD-10-CM | POA: Diagnosis not present

## 2022-06-06 NOTE — Progress Notes (Signed)
Internal Medicine Clinic Attending  Case discussed with the resident at the time of the visit.  We reviewed the resident's history and exam and pertinent patient test results.  I agree with the assessment, diagnosis, and plan of care documented in the resident's note.  

## 2022-06-10 ENCOUNTER — Ambulatory Visit: Payer: 59 | Admitting: Nurse Practitioner

## 2022-06-12 ENCOUNTER — Ambulatory Visit (INDEPENDENT_AMBULATORY_CARE_PROVIDER_SITE_OTHER): Payer: 59 | Admitting: Student

## 2022-06-12 ENCOUNTER — Other Ambulatory Visit: Payer: Self-pay

## 2022-06-12 ENCOUNTER — Ambulatory Visit (INDEPENDENT_AMBULATORY_CARE_PROVIDER_SITE_OTHER): Payer: 59 | Admitting: Licensed Clinical Social Worker

## 2022-06-12 ENCOUNTER — Encounter: Payer: Self-pay | Admitting: Student

## 2022-06-12 VITALS — BP 142/95 | HR 71 | Temp 97.7°F | Resp 32 | Ht 69.0 in | Wt 167.2 lb

## 2022-06-12 DIAGNOSIS — F419 Anxiety disorder, unspecified: Secondary | ICD-10-CM

## 2022-06-12 DIAGNOSIS — F32A Depression, unspecified: Secondary | ICD-10-CM

## 2022-06-12 DIAGNOSIS — R350 Frequency of micturition: Secondary | ICD-10-CM

## 2022-06-12 LAB — POCT URINALYSIS DIPSTICK
Bilirubin, UA: NEGATIVE
Blood, UA: NEGATIVE
Glucose, UA: NEGATIVE
Ketones, UA: NEGATIVE
Leukocytes, UA: NEGATIVE
Nitrite, UA: NEGATIVE
Protein, UA: NEGATIVE
Spec Grav, UA: 1.015 (ref 1.010–1.025)
Urobilinogen, UA: 0.2 E.U./dL
pH, UA: 7 (ref 5.0–8.0)

## 2022-06-12 MED ORDER — DULOXETINE HCL 30 MG PO CPEP: 30.0000 mg | ORAL_CAPSULE | Freq: Every day | ORAL | 1 refills | Status: DC

## 2022-06-12 NOTE — BH Specialist Note (Signed)
Integrated Behavioral Health Follow Up In-Person Visit  MRN: [id number] Name: [name]  Number of Integrated Behavioral Health Clinician visits: 2- Second Visit  Session Start time: 1330   Session End time: 1423  Total time in minutes: 53   Types of Service: Individual psychotherapy and General Behavioral Integrated Care (BHI)   Interpretor:No. Interpretor Name and Language: N/A     Warm Hand Off Completed.             Subjective: [name] is a 67 y.o. male  Patient was referred by Dickie La for Depression and V.A assistance. Patient reports the following symptoms/concerns: Depression and sadness due to health. Patients needs assistance with VA application.  Duration of problem: Years; Severity of problem: moderate   Objective: Mood: NA and Affect: Appropriate Risk of harm to self or others: No plan to harm self or others     Patient and/or Family's Strengths/Protective Factors: Social connections   Goals Addressed: Patient will: Reduce symptoms of: depression/Anxiety     Progress towards Goals: Ongoing   Interventions: Interventions utilized: Motivational Interviewing, Solution-Focused Strategies, and Supportive Counseling  Standardized Assessments completed: PHQ-SADS       03/26/2022    5:06 PM 08/20/2021    3:49 PM 07/18/2021    3:51 PM  PHQ-SADS Last 3 Score only  PHQ Adolescent Score 6 11 3       Assessment: Patient experiencing burning In stomach and incompetence issues. [facility] reported concerns to Nurse triage and PCP. [facility] educated patient on different forms of incompetence supplies and empathized with patient on how using supplies is normal.   [facility] and patient discussed support groups for bladder issues.   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    Patient reports his anxiety is all due to his urgency to use the bathroom and inability to secure a restroom.   [facility] had availability to  see patient on today. Patient will be seen by provider regarding bladder issues.   Plan: Follow up with behavioral health clinician on : within 30 days.      Christen Butter, MSW, LCSW-A She/Her Behavioral Health Clinician [facility]  Internal Medicine Center Direct Dial:[phone]  Fax [phone] Main Office Phone: [phone] [address] [place] [place], [place] [address] Website: [facility] Internal Medicine [facility]  [place], [place]  [place]
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
## 2022-06-12 NOTE — Patient Instructions (Signed)
  Thank you, [name], for allowing [place] to provide your care today. Today we discussed . . .  > Urinary frequency and painful urine patient       -There are no signs of infection on your urinalysis today.  Unfortunately I do not have a solution for your continued symptoms.  I think continuing to discuss this with your urologist and evaluate any options is a good idea.  Continuing to avoid caffeine and trying to have timed voids is still a good idea as well as continuing to take your Myrbetriq and tamsulosin. > Anxiety and depression       -We are going to restart your duloxetine 30 mg daily to help with your anxiety.  Also recommend continuing to follow-up with [facility] and we will see how this medication takes effects in the next 6-8 weeks.  If you have any worsening anxiety or depression please call our office.   I have ordered the following labs for you:   Lab Orders         Urinalysis, Reflex Microscopic       Referrals ordered today:   Referral Orders  No referral(s) requested today      I have ordered the following medication/changed the following medications:   Stop the following medications: Medications Discontinued During This Encounter  Medication Reason   DULoxetine (CYMBALTA) 30 MG capsule Reorder     Start the following medications: Meds ordered this encounter  Medications   DULoxetine (CYMBALTA) 30 MG capsule    Sig: Take 1 capsule (30 mg total) by mouth daily.    Dispense:  90 capsule    Refill:  1      Follow up:  1-2 months     Remember:     Should you have any questions or concerns please call the internal medicine clinic at [phone].     Rocky Morel, DO [facility]

## 2022-06-13 LAB — URINALYSIS, ROUTINE W REFLEX MICROSCOPIC
Bilirubin, UA: NEGATIVE
Glucose, UA: NEGATIVE
Ketones, UA: NEGATIVE
Leukocytes,UA: NEGATIVE
Nitrite, UA: NEGATIVE
Protein,UA: NEGATIVE
RBC, UA: NEGATIVE
Specific Gravity, UA: 1.008 (ref 1.005–1.030)
Urobilinogen, Ur: 0.2 mg/dL (ref 0.2–1.0)
pH, UA: 7.5 (ref 5.0–7.5)

## 2022-06-13 NOTE — Assessment & Plan Note (Signed)
His chronic health problems have caused significant anxiety and depression.  He has previously been on duloxetine but unfortunately has not continued this likely due to the number of other medications he is taking.  He did find benefit on duloxetine so we will restart this today. - Resume duloxetine 30 mg daily

## 2022-06-13 NOTE — Assessment & Plan Note (Signed)
Continues to have urinary frequency and recently has had some dysuria as well.  No other significant changes in his urinary symptoms.  He has had extensive workup by urology with his last visit on 04/10/2022.  Workup and management have included lifestyle modifications, several different medications titrated to maximum dosing including alpha blockers and antimuscarinics, prostate and bladder evaluation with cystoscopy.  Unfortunately he continues to have significant urgency incontinence.  Dysuria today is concerning for UTI but urinalysis in our clinic shows no signs of infection.  Unfortunately we cannot offer any additional therapy for his incontinence but recommend that he follows up with his urologist this soon as possible to discuss any further options.  This is also complicated by lumbar radiculopathy and chronic pain in his lower back managed by pain specialist with injections and current evaluation for surgical intervention.  He has some saddle anesthesia but has not had fecal incontinence from loss of feeling.  He does have fecal incontinence from IBS and is followed by GI for this.  He will soon see his back surgeon for discussion of surgery.  We did discuss that his urinary symptoms will likely not improve with spinal surgery.

## 2022-06-13 NOTE — Progress Notes (Signed)
Internal Medicine Clinic Attending  Case discussed with Dr. Goodwin  At the time of the visit.  We reviewed the resident's history and exam and pertinent patient test results.  I agree with the assessment, diagnosis, and plan of care documented in the resident's note.  

## 2022-06-16 DIAGNOSIS — M5416 Radiculopathy, lumbar region: Secondary | ICD-10-CM | POA: Diagnosis not present

## 2022-06-18 NOTE — Progress Notes (Signed)
Lmom to discuss with pt. Waiting on a return call. Pt has moved to [facility] and it's ok for pt to change cardiologist per Dr. Kirke Corin.

## 2022-06-20 ENCOUNTER — Telehealth: Payer: Self-pay | Admitting: Cardiovascular Disease

## 2022-06-20 ENCOUNTER — Telehealth: Payer: Self-pay | Admitting: Nurse Practitioner

## 2022-06-20 NOTE — Telephone Encounter (Signed)
Pt stated he received a call today and was unsure of the name left on the call. I informed pt that there were no notes of calling him today but I'd let the office know to reach out. Please advise

## 2022-06-20 NOTE — Telephone Encounter (Signed)
Patient does not answer.  LM that do not se anyone that has called.. Informed that if they reach out again, to try and get a name or department.

## 2022-06-20 NOTE — Telephone Encounter (Signed)
Patient stated that he is returning RN Alicia's call. Please advise.

## 2022-06-20 NOTE — Telephone Encounter (Signed)
Helmut Muster CMA will return call to patient.

## 2022-06-25 DIAGNOSIS — M48061 Spinal stenosis, lumbar region without neurogenic claudication: Secondary | ICD-10-CM | POA: Diagnosis not present

## 2022-06-25 DIAGNOSIS — J441 Chronic obstructive pulmonary disease with (acute) exacerbation: Secondary | ICD-10-CM | POA: Diagnosis not present

## 2022-06-25 DIAGNOSIS — M48062 Spinal stenosis, lumbar region with neurogenic claudication: Secondary | ICD-10-CM | POA: Diagnosis not present

## 2022-07-01 ENCOUNTER — Other Ambulatory Visit: Payer: Self-pay | Admitting: Primary Care

## 2022-07-01 NOTE — Telephone Encounter (Signed)
Script sent in April with extra refills attached

## 2022-07-06 ENCOUNTER — Emergency Department (HOSPITAL_COMMUNITY)
Admission: EM | Admit: 2022-07-06 | Discharge: 2022-07-06 | Disposition: A | Discharge status: Home / Self Care | Payer: 59 | Attending: Emergency Medicine | Admitting: Emergency Medicine

## 2022-07-06 ENCOUNTER — Emergency Department (HOSPITAL_COMMUNITY): Payer: 59

## 2022-07-06 ENCOUNTER — Other Ambulatory Visit: Payer: Self-pay

## 2022-07-06 DIAGNOSIS — N281 Cyst of kidney, acquired: Secondary | ICD-10-CM | POA: Diagnosis not present

## 2022-07-06 DIAGNOSIS — J439 Emphysema, unspecified: Secondary | ICD-10-CM | POA: Diagnosis not present

## 2022-07-06 DIAGNOSIS — R531 Weakness: Secondary | ICD-10-CM | POA: Diagnosis not present

## 2022-07-06 DIAGNOSIS — R5383 Other fatigue: Secondary | ICD-10-CM | POA: Diagnosis not present

## 2022-07-06 DIAGNOSIS — R109 Unspecified abdominal pain: Secondary | ICD-10-CM

## 2022-07-06 DIAGNOSIS — R140 Abdominal distension (gaseous): Secondary | ICD-10-CM | POA: Diagnosis not present

## 2022-07-06 DIAGNOSIS — R0602 Shortness of breath: Secondary | ICD-10-CM

## 2022-07-06 DIAGNOSIS — Z1152 Encounter for screening for COVID-19: Secondary | ICD-10-CM | POA: Diagnosis not present

## 2022-07-06 DIAGNOSIS — R110 Nausea: Secondary | ICD-10-CM | POA: Diagnosis not present

## 2022-07-06 DIAGNOSIS — Z85118 Personal history of other malignant neoplasm of bronchus and lung: Secondary | ICD-10-CM | POA: Insufficient documentation

## 2022-07-06 DIAGNOSIS — Z7951 Long term (current) use of inhaled steroids: Secondary | ICD-10-CM | POA: Insufficient documentation

## 2022-07-06 DIAGNOSIS — R1084 Generalized abdominal pain: Secondary | ICD-10-CM | POA: Diagnosis not present

## 2022-07-06 DIAGNOSIS — R630 Anorexia: Secondary | ICD-10-CM | POA: Insufficient documentation

## 2022-07-06 DIAGNOSIS — J9811 Atelectasis: Secondary | ICD-10-CM | POA: Diagnosis not present

## 2022-07-06 DIAGNOSIS — Z85528 Personal history of other malignant neoplasm of kidney: Secondary | ICD-10-CM | POA: Diagnosis not present

## 2022-07-06 DIAGNOSIS — R059 Cough, unspecified: Secondary | ICD-10-CM | POA: Insufficient documentation

## 2022-07-06 DIAGNOSIS — J449 Chronic obstructive pulmonary disease, unspecified: Secondary | ICD-10-CM | POA: Diagnosis not present

## 2022-07-06 LAB — CBC WITH DIFFERENTIAL/PLATELET
Abs Immature Granulocytes: 0.3 10*3/uL — ABNORMAL HIGH (ref 0.00–0.07)
Basophils Absolute: 0.1 10*3/uL (ref 0.0–0.1)
Basophils Relative: 0 %
Eosinophils Absolute: 0 10*3/uL (ref 0.0–0.5)
Eosinophils Relative: 0 %
HCT: 42.4 % (ref 39.0–52.0)
Hemoglobin: 13.7 g/dL (ref 13.0–17.0)
Immature Granulocytes: 3 %
Lymphocytes Relative: 4 %
Lymphs Abs: 0.5 10*3/uL — ABNORMAL LOW (ref 0.7–4.0)
MCH: 31.1 pg (ref 26.0–34.0)
MCHC: 32.3 g/dL (ref 30.0–36.0)
MCV: 96.1 fL (ref 80.0–100.0)
Monocytes Absolute: 0.6 10*3/uL (ref 0.1–1.0)
Monocytes Relative: 6 %
Neutro Abs: 9.9 10*3/uL — ABNORMAL HIGH (ref 1.7–7.7)
Neutrophils Relative %: 87 %
Platelets: 239 10*3/uL (ref 150–400)
RBC: 4.41 MIL/uL (ref 4.22–5.81)
RDW: 15 % (ref 11.5–15.5)
WBC: 11.4 10*3/uL — ABNORMAL HIGH (ref 4.0–10.5)
nRBC: 0 % (ref 0.0–0.2)

## 2022-07-06 LAB — COMPREHENSIVE METABOLIC PANEL
ALT: 28 U/L (ref 0–44)
AST: 25 U/L (ref 15–41)
Albumin: 3.5 g/dL (ref 3.5–5.0)
Alkaline Phosphatase: 65 U/L (ref 38–126)
Anion gap: 10 (ref 5–15)
BUN: 12 mg/dL (ref 8–23)
CO2: 24 mmol/L (ref 22–32)
Calcium: 8.9 mg/dL (ref 8.9–10.3)
Chloride: 102 mmol/L (ref 98–111)
Creatinine, Ser: 1.23 mg/dL (ref 0.61–1.24)
GFR, Estimated: >60 mL/min (ref >60)
Glucose, Bld: 143 mg/dL — ABNORMAL HIGH (ref 70–99)
Potassium: 4.4 mmol/L (ref 3.5–5.1)
Sodium: 136 mmol/L (ref 135–145)
Total Bilirubin: 1 mg/dL (ref 0.3–1.2)
Total Protein: 7.2 g/dL (ref 6.5–8.1)

## 2022-07-06 LAB — URINALYSIS, ROUTINE W REFLEX MICROSCOPIC
Bilirubin Urine: NEGATIVE
Glucose, UA: NEGATIVE mg/dL
Hgb urine dipstick: NEGATIVE
Ketones, ur: NEGATIVE mg/dL
Leukocytes,Ua: NEGATIVE
Nitrite: NEGATIVE
Protein, ur: NEGATIVE mg/dL
Specific Gravity, Urine: 1.018 (ref 1.005–1.030)
pH: 5 (ref 5.0–8.0)

## 2022-07-06 LAB — SARS CORONAVIRUS 2 BY RT PCR: SARS Coronavirus 2 by RT PCR: NEGATIVE

## 2022-07-06 LAB — LIPASE, BLOOD: Lipase: 26 U/L (ref 11–51)

## 2022-07-06 MED ORDER — IOHEXOL 300 MG/ML  SOLN
100.0000 mL | Freq: Once | INTRAMUSCULAR | Status: AC | PRN
  Administered 2022-07-06: 100 mL via INTRAVENOUS

## 2022-07-06 MED ORDER — HYDROMORPHONE HCL 1 MG/ML IJ SOLN
1.0000 mg | Freq: Once | INTRAMUSCULAR | Status: AC
  Administered 2022-07-06: 1 mg via INTRAVENOUS
  Filled 2022-07-06: qty 1

## 2022-07-06 NOTE — ED Triage Notes (Signed)
Pt arrives from home c/o generalized abdominal pain, decreased appetite, nausea, diarrhea, fatigue "for a while" but worse today. States that GI doctor told him that his stomach doesn't empty properly.  Also c/o productive cough x1 month and worsened SOB today. Hx of pneumonia, lung CA and COPD, on 2L O2 at all times. 86% on 2L upon arrival.

## 2022-07-06 NOTE — ED Provider Notes (Signed)
[name] Provider Note   CSN: [id number] Arrival date & time: 07/06/22  8295     History {Add pertinent medical, surgical, social history, OB history to HPI:1} Chief Complaint  Patient presents with   Abdominal Pain   Fatigue   Cough    CORTLEN DUBS is a 67 y.o. male with history of COPD on 2 L of the cannula at baseline, lung cancer, kidney cancer status post left partial nephrectomy, recurring pneumonia, presented to ED with generalized weakness, decreased appetite, nausea, abdominal pain.  Patient reports that "everything just hurts".  He says he been having chronic issues with feeling bloated after eating, issues of incontinence of his bowel movements, issues of chronic back and hip pain.  He has been seen by several specialist including his PCP for this, and does not clear what is causing his GI issues.  He says he feels like he "just got hit by a truck" and that he is exhausted and has myalgias "all over".  HPI     Home Medications Prior to Admission medications   Medication Sig Start Date End Date Taking? Authorizing Provider  acetaminophen (TYLENOL) 500 MG tablet Take 500 mg by mouth every 6 (six) hours as needed for mild pain or headache.     [provider]  albuterol (PROVENTIL HFA) 108 (90 Base) MCG/ACT inhaler INHALE 2 PUFFS BY MOUTH EVERY 6 HOURS AS NEEDED FOR WHEEZING OR SHORTNESS OF BREATH Patient taking differently: Inhale 2 puffs into the lungs every 6 (six) hours as needed for wheezing or shortness of breath. 11/27/21   Leslye Peer, MD  albuterol (PROVENTIL) (2.5 MG/3ML) 0.083% nebulizer solution Take 3 mLs (2.5 mg total) by nebulization every 6 (six) hours as needed for wheezing or shortness of breath. 11/27/21   Leslye Peer, MD  amoxicillin-clavulanate (AUGMENTIN) 875-125 MG tablet Take 1 tablet by mouth 2 (two) times daily. 04/11/22   Glenford Bayley, NP  azithromycin (ZITHROMAX) 250 MG tablet  Take 1 tablet (250 mg total) by mouth daily. 05/28/22   Leslye Peer, MD  carboxymethylcellulose (REFRESH PLUS) 0.5 % SOLN Place 1 drop into both eyes 3 (three) times daily as needed (dry eyes).    [provider]  cefUROXime (CEFTIN) 250 MG tablet Take 1 tablet (250 mg total) by mouth 2 (two) times daily with a meal. 05/28/22   Byrum, Les Pou, MD  cholecalciferol (VITAMIN D3) 25 MCG (1000 UNIT) tablet Take 1 tablet (1,000 Units total) by mouth daily. 06/05/22   Steffanie Rainwater, MD  dicyclomine (BENTYL) 10 MG capsule Take 1 capsule (10 mg total) by mouth in the morning and at bedtime. 11/19/21   Mansouraty, Netty Starring., MD  doxycycline (VIBRA-TABS) 100 MG tablet Take 1 tablet (100 mg total) by mouth 2 (two) times daily. 05/28/22   Leslye Peer, MD  DULoxetine (CYMBALTA) 30 MG capsule Take 1 capsule (30 mg total) by mouth daily. 06/12/22   Rocky Morel, DO  eszopiclone (LUNESTA) 1 MG TABS tablet Take 1 tablet (1 mg total) by mouth at bedtime as needed for sleep. Take immediately before bedtime 03/29/19   Virl Diamond A, MD  fexofenadine (ALLEGRA ALLERGY) 180 MG tablet Take 1 tablet (180 mg total) by mouth daily. 12/13/20   Evlyn Kanner, MD  fluticasone Aleda Grana) 50 MCG/ACT nasal spray Use 2 spray(s) in each nostril once daily 02/24/22   Steffanie Rainwater, MD  guaiFENesin (ROBITUSSIN CHEST CONGESTION PO) Take 10 mLs  by mouth every 4 (four) hours as needed (cough and congestion.).    [provider]  hydrOXYzine (ATARAX) 25 MG tablet Take 1 tablet (25 mg total) by mouth every 6 (six) hours as needed for anxiety (or sleep). 03/26/21   Steffanie Rainwater, MD  isosorbide mononitrate (IMDUR) 30 MG 24 hr tablet Take 1 tablet (30 mg total) by mouth daily. 03/19/22   Joylene Grapes, NP  ketoconazole (NIZORAL) 2 % shampoo Apply 1 application  topically 2 (two) times a week. 08/14/20   [provider]  loperamide (IMODIUM) 2 MG capsule Take 1 capsule (2 mg total) by mouth  as needed for diarrhea or loose stools. 02/08/22   Sherryll Burger, Pratik D, DO  meloxicam (MOBIC) 7.5 MG tablet Take 7.5 mg by mouth daily as needed for pain. 01/06/20   [provider]  mesalamine (LIALDA) 1.2 g EC tablet Take 2 tablets by mouth at bedtime. 11/19/21   Mansouraty, Netty Starring., MD  metoCLOPramide (REGLAN) 5 MG tablet Take 1 tablet (5 mg total) by mouth every 8 (eight) hours as needed for nausea or vomiting. 05/19/22 08/17/22  Doree Albee, PA-C  MYRBETRIQ 50 MG TB24 tablet Take 1 tablet (50 mg total) by mouth daily. 06/04/22   Steffanie Rainwater, MD  naproxen sodium (ALEVE) 220 MG tablet Take 220 mg by mouth daily as needed.    [provider]  nitroGLYCERIN (NITROSTAT) 0.4 MG SL tablet Place 1 tablet (0.4 mg total) under the tongue every 5 (five) minutes as needed for chest pain. 08/19/21 02/05/22  Joylene Grapes, NP  omeprazole (PRILOSEC) 40 MG capsule Take 1 capsule (40 mg total) by mouth daily. 01/06/22   Doree Albee, PA-C  ondansetron (ZOFRAN) 4 MG tablet Take 1 tablet (4 mg total) by mouth every 8 (eight) hours as needed for nausea or vomiting. 04/14/22   Glenford Bayley, NP  OXYGEN Inhale 2 L into the lungs at bedtime.    [provider]  predniSONE (DELTASONE) 10 MG tablet Take 2 tablets (20 mg total) by mouth daily with breakfast. 05/28/22   Leslye Peer, MD  pregabalin (LYRICA) 100 MG capsule Take 100 mg by mouth daily. 04/29/19   [provider]  rosuvastatin (CRESTOR) 20 MG tablet Take 1 tablet (20 mg total) by mouth daily. 01/01/22 01/01/23  Steffanie Rainwater, MD  Simethicone 125 MG TABS Take 1 tablet (125 mg total) by mouth 3 (three) times daily as needed. 05/18/22   Steffanie Rainwater, MD  sucralfate (CARAFATE) 1 g tablet Take 1 tablet (1 g total) by mouth 4 (four) times daily -  with meals and at bedtime. 01/06/22 02/05/22  Doree Albee, PA-C  tamsulosin (FLOMAX) 0.4 MG CAPS capsule Take 0.4 mg by mouth 2 (two) times daily. 01/20/19    [provider]  traMADol (ULTRAM) 50 MG tablet Take 50 mg by mouth 2 (two) times daily as needed. 11/14/21   [provider]  Dwyane Luo 100-62.5-25 MCG/ACT AEPB INHALE 1 PUFF INTO LUNGS ONCE DAILY 05/26/22   Leslye Peer, MD      Allergies    Patient has no known allergies.    Review of Systems   Review of Systems  Physical Exam Updated Vital Signs BP (!) 119/90 (BP Location: Right Arm)   Pulse 92   Temp 97.6 F (36.4 C) (Oral)   Resp 20   Ht 5\' 9"  (1.753 m)   Wt 73 kg   SpO2 [name])  88%   BMI 23.78 kg/m  Physical Exam Constitutional:      General: He is not in acute distress. HENT:     Head: Normocephalic and atraumatic.  Eyes:     Conjunctiva/sclera: Conjunctivae normal.     Pupils: Pupils are equal, round, and reactive to light.  Cardiovascular:     Rate and Rhythm: Normal rate and regular rhythm.  Pulmonary:     Effort: Pulmonary effort is normal. No respiratory distress.     Comments: 94% on 2L [place] baseline, no wheezing on exam Abdominal:     General: There is distension.     Tenderness: There is no abdominal tenderness. There is no guarding or rebound.  Skin:    General: Skin is warm and dry.  Neurological:     General: No focal deficit present.     Mental Status: He is alert. Mental status is at baseline.  Psychiatric:        Mood and Affect: Mood normal.        Behavior: Behavior normal.     ED Results / Procedures / Treatments   Labs (all labs ordered are listed, but only abnormal results are displayed) Labs Reviewed  COMPREHENSIVE METABOLIC PANEL  CBC WITH DIFFERENTIAL/PLATELET  LIPASE, BLOOD  URINALYSIS, ROUTINE W REFLEX MICROSCOPIC    EKG None  Radiology No results found.  Procedures Procedures  {Document cardiac monitor, telemetry assessment procedure when appropriate:1}  Medications Ordered in ED Medications  HYDROmorphone (DILAUDID) injection 1 mg (has no administration in time range)    ED Course/ Medical  Decision Making/ A&P   {   Click here for ABCD2, HEART and other calculatorsREFRESH Note before signing :1}                          Medical Decision Making Amount and/or Complexity of Data Reviewed Labs: ordered. Radiology: ordered.  Risk Prescription drug management.   This patient presents to the ED with concern for shortness of breath, myalgias, abdominal pain. This involves an extensive number of treatment options, and is a complaint that carries with it a high risk of complications and morbidity.  The differential diagnosis includes viral illness versus bacterial illness versus pneumonia versus urinary tract infection versus other  Co-morbidities that complicate the patient evaluation: History of COPD at high risk of pulmonary complications, history of recurring pneumonia  External records from outside source obtained and reviewed including CT PE study in December which had no acute PE, noted to have by basilar opacities at the time.  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  I ordered and personally interpreted labs.  The pertinent results include:  ***  I ordered imaging studies including *** I independently visualized and interpreted imaging which showed *** I agree with the radiologist interpretation  The patient was maintained on a cardiac monitor.  I personally viewed and interpreted the cardiac monitored which showed an underlying rhythm of: ***  Per my interpretation the patient's ECG shows ***  I ordered medication including ***  for *** I have reviewed the patients home medicines and have made adjustments as needed  Test Considered: ***  I requested consultation with the ***,  and discussed lab and imaging findings as well as pertinent plan - they recommend: ***  After the interventions noted above, I reevaluated the patient and found that they have:  {resolved/improved/worsened:23923::"improved"}  Social Determinants of Health:***  Dispostion:  After consideration of the diagnostic results and the patients response to treatment, I feel that the patent would benefit from ***.   {Document critical care time when appropriate:1} {Document review of labs and clinical decision tools ie heart score, Chads2Vasc2 etc:1}  {Document your independent review of radiology images, and any outside records:1} {Document your discussion with family members, caretakers, and with consultants:1} {Document social determinants of health affecting pt's care:1} {Document your decision making why or why not admission, treatments were needed:1} Final Clinical Impression(s) / ED Diagnoses Final diagnoses:  None    Rx / DC Orders ED Discharge Orders     None

## 2022-07-08 ENCOUNTER — Telehealth: Payer: Self-pay | Admitting: *Deleted

## 2022-07-08 NOTE — Telephone Encounter (Signed)
Spoke with patient regarding his PCS request. Patient is to call office to schedule assessment appointment, patient will call clinic to get the number again to call. Mosie Lukes / [place]chel [place].[phone].

## 2022-07-09 ENCOUNTER — Telehealth: Payer: Self-pay | Admitting: *Deleted

## 2022-07-09 ENCOUNTER — Ambulatory Visit (INDEPENDENT_AMBULATORY_CARE_PROVIDER_SITE_OTHER): Payer: 59 | Admitting: Student

## 2022-07-09 VITALS — BP 116/77 | HR 83 | Temp 98.1°F | Ht 69.0 in | Wt 164.0 lb

## 2022-07-09 DIAGNOSIS — J40 Bronchitis, not specified as acute or chronic: Secondary | ICD-10-CM | POA: Diagnosis not present

## 2022-07-09 DIAGNOSIS — I10 Essential (primary) hypertension: Secondary | ICD-10-CM

## 2022-07-09 DIAGNOSIS — J449 Chronic obstructive pulmonary disease, unspecified: Secondary | ICD-10-CM | POA: Diagnosis not present

## 2022-07-09 DIAGNOSIS — Z87891 Personal history of nicotine dependence: Secondary | ICD-10-CM | POA: Diagnosis not present

## 2022-07-09 DIAGNOSIS — J9611 Chronic respiratory failure with hypoxia: Secondary | ICD-10-CM

## 2022-07-09 DIAGNOSIS — Z85118 Personal history of other malignant neoplasm of bronchus and lung: Secondary | ICD-10-CM

## 2022-07-09 DIAGNOSIS — Z9981 Dependence on supplemental oxygen: Secondary | ICD-10-CM | POA: Diagnosis not present

## 2022-07-09 DIAGNOSIS — J432 Centrilobular emphysema: Secondary | ICD-10-CM

## 2022-07-09 MED ORDER — TRAMADOL HCL 50 MG PO TABS: 50.0000 mg | ORAL_TABLET | Freq: Two times a day (BID) | ORAL | 0 refills | Status: DC | PRN

## 2022-07-09 MED ORDER — FLUTICASONE PROPIONATE 50 MCG/ACT NA SUSP: NASAL | 3 refills | Status: DC

## 2022-07-09 MED ORDER — TRELEGY ELLIPTA 100-62.5-25 MCG/ACT IN AEPB: INHALATION_SPRAY | RESPIRATORY_TRACT | 0 refills | Status: DC

## 2022-07-09 MED ORDER — PREDNISONE 10 MG PO TABS: ORAL_TABLET | ORAL | 1 refills | Status: DC

## 2022-07-09 NOTE — Assessment & Plan Note (Signed)
BP at goal (116/77) on imdur 30mg  daily.

## 2022-07-09 NOTE — Telephone Encounter (Signed)
Spoke with patient regarding his assessment appointment for [facility] / appointment 07-14-2022 -nurse Tiffany. She will come between the hours of 10:30 am - 11:30am. Patient was given phone number if unable to keep this appointment. [phone].

## 2022-07-09 NOTE — Assessment & Plan Note (Signed)
Patient with history of severe COPD with bronchitic symptoms, chronic hypoxic respiratory failure on 2L [place], and lung cancer s/p left upper lobectomy (2013) presenting with a 2-day history of congestion, sinus headaches, runny nose, and productive cough with intermittent hemoptysis. He follows pulmonology, Dr. Delton Coombes, for his chronic lung disease and is currently on trelegy, albuterol prn, prednisone 20mg  daily, and rotating antibiotics (doxycycline, azithromycin, cefuroxime).  He notes that his symptoms began 2 days ago after visiting the [facility] ED for abdominal pain. His lab work at that time was unremarkable aside from mild leukocytosis which was noted on previous lab work as well. He had a negative COVID test at that time. He began noticing congestion and runny nose first, followed by a sinus headache mainly around frontal and maxillary sinuses bilaterally. He also developed a cough productive of green-colored sputum. After multiple coughing bouts, he noticed that his cough began becoming blood-tinged. He denies fevers, chills, N/V, weight loss, chest pain. He has not needed to increase his home oxygen, still using 2L [place] chronically.   On exam, he does not have any wheezing but does have some mild fine crackles at the bases. He does have some redness of his nasal turbinates but no blood noted. He is saturating 91% on home 2L [place].  His constellation of symptoms appears consistent with likely viral URI (given congestion, runny nose, sinus headaches) with acute on chronic bronchitis. He has chronic bronchitis in conjunction with severe COPD but this appears to have been exacerbated by recent viral illness, and likely is the main suspect behind his hemoptysis. The severity of his symptoms are likely due to his pre-existing advanced lung disease. Have recommended intranasal glucocorticoids (flonase) and using nasal irrigation to help with congestion symptoms. Have refilled his trelegy as well. Given acute on  chronic bronchitis, have recommended increasing his prednisone to 40mg  for a 5-day course prior to decreasing it back to his chronic 20mg  daily. He will also reach out to pulmonology for close follow up.  Plan: -flonase, nasal irrigation -continue trelegy -prednisone 40mg  daily x5 days, then back down to 20mg  daily -f/u with pulmonology
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
## 2022-07-09 NOTE — Patient Instructions (Signed)
Mr. Gatchell,  It was a pleasure seeing you in the clinic today.   For your cough and congestion symptoms, I recommend using the flonase as directed along with using a Neti pot to help irrigate your sinuses. This will help with your congestion symptoms. For your cough, I think that this is coming from inflammation in your airways. For the next 5 days, I want you to take prednisone 40mg  (4 tablets) every day. After 5 days, go back to taking 2 tablets (20mg ) every day. Please reach out to your lung doctor (Dr. Delton Coombes) to schedule a visit so that they can also look into your symptoms. Please come back to see [place] in 1 month for your next visit.  Please call our clinic at [phone] if you have any questions or concerns. The best time to call is Monday-Friday from 9am-4pm, but there is someone available 24/7 at the same number. If you need medication refills, please notify your pharmacy one week in advance and they will send [place] a request.   Thank you for letting us take part in your care. We look forward to seeing you next time!

## 2022-07-10 NOTE — Progress Notes (Signed)
Internal Medicine Clinic Attending  Case discussed with Dr. Austin Miles  At the time of the visit.  We reviewed the resident's history and exam and pertinent patient test results.  I agree with the assessment, diagnosis, and plan of care documented in the resident's note.   Acute on chronic bronchitis causing minor hemoptysis in this person with advanced stage COPD. In addition to increasing steroid, continuing triple inhaler therapy, he will also continue antibiotics which is currently azithromycin.

## 2022-07-11 ENCOUNTER — Telehealth: Payer: Self-pay | Admitting: Physician Assistant

## 2022-07-11 NOTE — Telephone Encounter (Signed)
Inbound call from patient stating he is having severe abdominal pain, no appetite, and feels sluggish. Scheduled for 7/31. Requesting a call back to discuss what could be done sooner. Please advise, thank you.

## 2022-07-14 ENCOUNTER — Telehealth: Payer: Self-pay | Admitting: Pulmonary Disease

## 2022-07-14 ENCOUNTER — Ambulatory Visit: Payer: 59 | Admitting: Pulmonary Disease

## 2022-07-14 NOTE — Telephone Encounter (Addendum)
Patient came to the [employer] office on Friday, 6/7 and dropped off HUD accommodation application forms.  He lives in the [facility] housing complex in [place] and is having trouble getting in the building after 9:00pm.  Patient explained the parking in the front of the building is very limited and there are not enough handicapped parking places for him to get one, so we parks in the back of the building.  For security purposes, the back entrance is locked after 9:00pm and if he is later than that he needs to walk to the front of the building in order to get in.  He is on oxygen 24/7 and carries a Designer, jewellery.  Walking that distance puts him extremely out of breath.  The HUD form will allow for him to have his security access fob re-programmed so that he can enter the back door after 9:00pm.  I completed the form and Dr. Francine Graven will review it with him during today's appointment.
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
## 2022-07-14 NOTE — Telephone Encounter (Signed)
Pt states he is having multiple, loose stools and would like to be seen sooner. Pt scheduled to see Willette Cluster NP 07/18/22 at 11:30am. Pt aware of appt.

## 2022-07-15 NOTE — Telephone Encounter (Signed)
Patient canceled appointment on 6/10 and rescheduled for Thu, 6/13.

## 2022-07-17 ENCOUNTER — Ambulatory Visit (INDEPENDENT_AMBULATORY_CARE_PROVIDER_SITE_OTHER): Payer: 59 | Admitting: Licensed Clinical Social Worker

## 2022-07-17 ENCOUNTER — Ambulatory Visit (INDEPENDENT_AMBULATORY_CARE_PROVIDER_SITE_OTHER): Payer: 59 | Admitting: Pulmonary Disease

## 2022-07-17 ENCOUNTER — Encounter: Payer: Self-pay | Admitting: Pulmonary Disease

## 2022-07-17 ENCOUNTER — Ambulatory Visit (INDEPENDENT_AMBULATORY_CARE_PROVIDER_SITE_OTHER): Payer: 59

## 2022-07-17 VITALS — BP 120/72 | HR 74 | Temp 97.6°F | Ht 69.0 in | Wt 166.0 lb

## 2022-07-17 DIAGNOSIS — R042 Hemoptysis: Secondary | ICD-10-CM | POA: Diagnosis not present

## 2022-07-17 DIAGNOSIS — J9611 Chronic respiratory failure with hypoxia: Secondary | ICD-10-CM | POA: Diagnosis not present

## 2022-07-17 DIAGNOSIS — J439 Emphysema, unspecified: Secondary | ICD-10-CM | POA: Diagnosis not present

## 2022-07-17 DIAGNOSIS — F32A Depression, unspecified: Secondary | ICD-10-CM

## 2022-07-17 DIAGNOSIS — J432 Centrilobular emphysema: Secondary | ICD-10-CM | POA: Diagnosis not present

## 2022-07-17 DIAGNOSIS — F419 Anxiety disorder, unspecified: Secondary | ICD-10-CM

## 2022-07-17 MED ORDER — PREDNISONE 20 MG PO TABS
20.0000 mg | ORAL_TABLET | Freq: Every day | ORAL | 0 refills | Status: DC
Start: 2022-07-17 — End: 2022-08-01

## 2022-07-17 MED ORDER — TRELEGY ELLIPTA 200-62.5-25 MCG/ACT IN AEPB
1.0000 | INHALATION_SPRAY | Freq: Every day | RESPIRATORY_TRACT | 5 refills | Status: DC
Start: 2022-07-17 — End: 2022-12-26

## 2022-07-17 MED ORDER — LEVOFLOXACIN 750 MG PO TABS
750.0000 mg | ORAL_TABLET | Freq: Every day | ORAL | 0 refills | Status: DC
Start: 2022-07-17 — End: 2022-08-01

## 2022-07-17 NOTE — Patient Instructions (Addendum)
Use Trelegy ellipta 1 puff daily - rinse mouth out after each use  Return to 20mg  daily of prednisone after your 5 days of 40mg  daily   Continue to use albuterol inhaler or nebulizer treatments every 4-6 hours as needed  Hold your regular antibiotic regimen (doxycycline, azithromycin, cefuroxime) for 1 week  Take levaquin 750mg  daily for 7 days  Follow up in 2 weeks with a nurse practitioner

## 2022-07-17 NOTE — BH Specialist Note (Signed)
Integrated Behavioral Health Follow Up In-Person Visit  MRN: [id number] Name: [name]  Number of Integrated Behavioral Health Clinician visits: 2- Second Visit  Session Start time: 1330   Session End time: 1430  Total time in minutes: 60   Types of Service: Individual psychotherapy and General Behavioral Integrated Care (BHI)   Interpretor:No. Interpretor Name and Language: N/A     Warm Hand Off Completed.             Subjective: [name] is a 67 y.o. male  Patient was referred by Dickie La for Depression and V.A assistance. Patient reports the following symptoms/concerns: Depression and sadness due to health. Patients needs assistance with VA application.  Duration of problem: Years; Severity of problem: moderate   Objective: Mood: NA and Affect: Appropriate Risk of harm to self or others: No plan to harm self or others     Patient and/or Family's Strengths/Protective Factors: Social connections   Goals Addressed: Patient will: Reduce symptoms of: depression/Anxiety     Progress towards Goals: Ongoing   Interventions: Interventions utilized: Motivational Interviewing, Solution-Focused Strategies, and Supportive Counseling  Standardized Assessments completed: PHQ-SADS       03/26/2022    5:06 PM 08/20/2021    3:49 PM 07/18/2021    3:51 PM  PHQ-SADS Last 3 Score only  PHQ Adolescent Score 6 11 3       Assessment:  Patient recently in ER. Patient states he has seen some improvement.  [facility] and patient discussed support groups for bladder issues.    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    Patient reports his anxiety is all due to his urgency to use the bathroom and inability to secure a restroom.        Plan: Follow up with behavioral health clinician on : within 30 days.      Christen Butter, MSW, LCSW-A She/Her Behavioral Health Clinician [facility]  Internal Medicine  Center Direct Dial:[phone]  Fax [phone] Main Office Phone: [phone] [address] [place] [place], [place] [address] Website: [facility]  [place], [place]  [place]
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
## 2022-07-18 ENCOUNTER — Other Ambulatory Visit: Payer: 59

## 2022-07-18 ENCOUNTER — Encounter: Payer: Self-pay | Admitting: Nurse Practitioner

## 2022-07-18 ENCOUNTER — Encounter: Payer: Self-pay | Admitting: Pulmonary Disease

## 2022-07-18 ENCOUNTER — Ambulatory Visit (INDEPENDENT_AMBULATORY_CARE_PROVIDER_SITE_OTHER): Payer: 59 | Admitting: Nurse Practitioner

## 2022-07-18 VITALS — BP 124/80 | HR 76

## 2022-07-18 DIAGNOSIS — K3184 Gastroparesis: Secondary | ICD-10-CM | POA: Diagnosis not present

## 2022-07-18 DIAGNOSIS — G8929 Other chronic pain: Secondary | ICD-10-CM

## 2022-07-18 DIAGNOSIS — R109 Unspecified abdominal pain: Secondary | ICD-10-CM | POA: Diagnosis not present

## 2022-07-18 DIAGNOSIS — R197 Diarrhea, unspecified: Secondary | ICD-10-CM | POA: Diagnosis not present

## 2022-07-18 MED ORDER — METOCLOPRAMIDE HCL 5 MG PO TABS: 5.0000 mg | ORAL_TABLET | Freq: Three times a day (TID) | ORAL | 2 refills | Status: DC | PRN
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
## 2022-07-18 NOTE — Patient Instructions (Addendum)
Your provider has requested that you go to the basement level for lab work before leaving today. Press "B" on the elevator. The lab is located at the first door on the left as you exit the elevator.   We have sent the following medications to your pharmacy for you to pick up at your convenience: Reglan  Keep your scheduled appointment with [name] on 09/03/22 at 2:50 PM . Please arrive 10 minutes early for your appointment.    _______________________________________________________  If your blood pressure at your visit was 140/90 or greater, please contact your primary care physician to follow up on this.  _______________________________________________________  If you are age 67 or older, your body mass index should be between 23-30. Your There is no height or weight on file to calculate BMI. If this is out of the aforementioned range listed, please consider follow up with your Primary Care Provider. __________________________________________________________  The [place] [facility] providers would like to encourage you to use [facility] to communicate with providers for non-urgent requests or questions.  Due to long hold times on the telephone, sending your provider a message by [facility] may be a faster and more efficient way to get a response.  Please allow 48 business hours for a response.  Please remember that this is for non-urgent requests.   Due to recent changes in healthcare laws, you may see the results of your imaging and laboratory studies on MyChart before your provider has had a chance to review them.  We understand that in some cases there may be results that are confusing or concerning to you. Not all laboratory results come back in the same time frame and the provider may be waiting for multiple results in order to interpret others.  Please give [place] 48 hours in order for your provider to thoroughly review all the results before contacting the office for clarification of your results.      Thank you for choosing me and [facility].  Willette Cluster, NP.

## 2022-07-19 ENCOUNTER — Other Ambulatory Visit: Payer: Self-pay | Admitting: Emergency Medicine

## 2022-07-19 NOTE — Progress Notes (Signed)
Attending Physician's Attestation   I have reviewed the chart.   I agree with the Advanced Practitioner's note, impression, and recommendations with any updates as below.    Lyndle Pang Mansouraty, MD [facility]oscopy Office # [phone]  

## 2022-07-21 ENCOUNTER — Ambulatory Visit (INDEPENDENT_AMBULATORY_CARE_PROVIDER_SITE_OTHER): Payer: 59 | Admitting: Student

## 2022-07-21 ENCOUNTER — Encounter: Payer: Self-pay | Admitting: Student

## 2022-07-21 VITALS — BP 102/71 | HR 81 | Temp 98.0°F | Ht 69.0 in | Wt 164.1 lb

## 2022-07-21 DIAGNOSIS — J40 Bronchitis, not specified as acute or chronic: Secondary | ICD-10-CM

## 2022-07-21 DIAGNOSIS — Z7189 Other specified counseling: Secondary | ICD-10-CM | POA: Diagnosis not present

## 2022-07-21 DIAGNOSIS — J9611 Chronic respiratory failure with hypoxia: Secondary | ICD-10-CM | POA: Diagnosis not present

## 2022-07-21 DIAGNOSIS — R0789 Other chest pain: Secondary | ICD-10-CM | POA: Diagnosis not present

## 2022-07-21 DIAGNOSIS — R5381 Other malaise: Secondary | ICD-10-CM

## 2022-07-21 MED ORDER — LIDOCAINE 5 % EX PTCH: MEDICATED_PATCH | CUTANEOUS | 5 refills | Status: DC

## 2022-07-21 MED ORDER — BENZONATATE 100 MG PO CAPS: 100.0000 mg | ORAL_CAPSULE | Freq: Three times a day (TID) | ORAL | 1 refills | Status: DC | PRN
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
## 2022-07-21 NOTE — Telephone Encounter (Signed)
Dr. Francine Graven saw the patient in office on 6/13 and completed the Request for Accommodation form.  The original was provided to the patient and a copy will be scanned into his chart.

## 2022-07-21 NOTE — Patient Instructions (Signed)
Thank you, [name] for allowing [place] to provide your care today. Today we discussed your cough, GI symptoms and chronic pain.  Thank you medication to help with your cough.  I have also ordered a lidocaine patch to help with your chest discomfort.  Make sure to return your stool sample and follow-up with GI as scheduled.  I have place a referrals to palliative care to discuss goals of care   I have ordered the following medication/changed the following medications:  Start Tessalon Perle 100 mg 3 times daily as needed for cough Apply lidocaine patch to chest wall every 12 hours  My Chart Access: [email or web address]  Please follow-up in 3 months  Please make sure to arrive 15 minutes prior to your next appointment. If you arrive late, you may be asked to reschedule.    We look forward to seeing you next time. Please call our clinic at [phone] if you have any questions or concerns. The best time to call is Monday-Friday from 9am-4pm, but there is someone available 24/7. If after hours or the weekend, call the main hospital number and ask for the Internal Medicine Resident On-Call. If you need medication refills, please notify your pharmacy one week in advance and they will send [place] a request.   Thank you for letting us take part in your care. Wishing you the best!  Steffanie Rainwater, MD 07/21/2022, 3:54 PM IM Resident, PGY-3 [place] Heck 41:10

## 2022-07-22 ENCOUNTER — Ambulatory Visit: Payer: 59 | Admitting: Emergency Medicine

## 2022-07-23 ENCOUNTER — Other Ambulatory Visit: Payer: 59

## 2022-07-23 ENCOUNTER — Encounter: Payer: Self-pay | Admitting: Student

## 2022-07-23 DIAGNOSIS — K3184 Gastroparesis: Secondary | ICD-10-CM | POA: Diagnosis not present

## 2022-07-23 DIAGNOSIS — R110 Nausea: Secondary | ICD-10-CM | POA: Diagnosis not present

## 2022-07-23 DIAGNOSIS — K529 Noninfective gastroenteritis and colitis, unspecified: Secondary | ICD-10-CM | POA: Diagnosis not present

## 2022-07-23 DIAGNOSIS — R1084 Generalized abdominal pain: Secondary | ICD-10-CM | POA: Diagnosis not present

## 2022-07-23 DIAGNOSIS — Z7189 Other specified counseling: Secondary | ICD-10-CM | POA: Insufficient documentation

## 2022-07-23 DIAGNOSIS — K638219 Small intestinal bacterial overgrowth, unspecified: Secondary | ICD-10-CM

## 2022-07-23 NOTE — Assessment & Plan Note (Signed)
Patient here for follow-up after diagnosis of bronchitis 2 weeks ago. He completed a 5-day course of prednisone 50 mg and transitioned back down to his chronic prednisone 20 mg daily. He was evaluated by his pulmonologist last Thursday due to an episode of hemoptysis. An x-ray was obtained and he was advised to hold his rotating antibiotics and take Levaquin for 7 days before resuming. He denies any hemoptysis and reports improvement in his sputum production but continues to have ongoing cough that is worse at night. He reports associated chest discomfort and soreness from the ongoing cough.  On exam, patient remains on his home 2 L [place] with O2 sats of 94%. He has decreased airflow throughout his lungs but I do not appreciate any wheezing, rhonchi or rales. He continues to have dry cough throughout encounter.  Plan: -Finish 7-day course of Levaquin -Start Tessalon Perle 100 mg 3 times daily as needed for cough -Continue home Trelegy and as needed albuterol inhaler and nebulizer -Continue prednisone 20 mg daily -Follow-up with pulmonology as needed

## 2022-07-23 NOTE — Assessment & Plan Note (Signed)
During my encounter with patient today, he discussed with me the recent death of his sponsor. Patient informed me that his sponsor had a massive heart attack and remained on the ventilator before passing away over the weekend. Patient informed me that he does not want to end up on the ventilator like his sponsor and is interested in discussing further about how to make his wishes known. I discussed with patient briefly about CODE STATUS, the MOST form and advanced directives. He is currently full code but is interested in possibly changing this to DNR after further discussions. I informed patient that due to his end-stage COPD, chronic pain, and generalized deconditioning, I recommend engaging palliative care in his ongoing medical care.  Patient agreeable to referral to palliative care.  Plan: -Referral to palliative care to discuss goals of care, symptom management and advance directives

## 2022-07-23 NOTE — Assessment & Plan Note (Signed)
Patient with a history of chronic cough presenting today with chest wall discomfort.  Patient reports that since he has been coughing frequently the last few weeks, he has had some chest discomfort in the middle of his chest.  He describes the pain as soreness, nonradiating and worse with coughing.  On exam, there is mild tenderness to palpation of the middle chest wall. -Tessalon Perles as needed for cough -Lidocaine patch, apply to chest wall every 12 hours

## 2022-07-24 NOTE — Progress Notes (Signed)
Internal Medicine Clinic Attending  Case discussed with Dr. [place]  At the time of the visit.  We reviewed the resident's history and exam and pertinent patient test results.  I agree with the assessment, diagnosis, and plan of care documented in the resident's note.  

## 2022-07-26 DIAGNOSIS — J441 Chronic obstructive pulmonary disease with (acute) exacerbation: Secondary | ICD-10-CM | POA: Diagnosis not present

## 2022-07-29 ENCOUNTER — Encounter: Payer: Self-pay | Admitting: Nurse Practitioner

## 2022-07-29 ENCOUNTER — Ambulatory Visit: Payer: 59 | Attending: Nurse Practitioner | Admitting: Nurse Practitioner

## 2022-07-29 ENCOUNTER — Other Ambulatory Visit: Payer: Self-pay

## 2022-07-29 VITALS — BP 130/90 | HR 67 | Ht 69.0 in | Wt 165.4 lb

## 2022-07-29 DIAGNOSIS — M79605 Pain in left leg: Secondary | ICD-10-CM

## 2022-07-29 DIAGNOSIS — J449 Chronic obstructive pulmonary disease, unspecified: Secondary | ICD-10-CM

## 2022-07-29 DIAGNOSIS — R072 Precordial pain: Secondary | ICD-10-CM

## 2022-07-29 DIAGNOSIS — I2510 Atherosclerotic heart disease of native coronary artery without angina pectoris: Secondary | ICD-10-CM

## 2022-07-29 DIAGNOSIS — Z87891 Personal history of nicotine dependence: Secondary | ICD-10-CM | POA: Diagnosis not present

## 2022-07-29 DIAGNOSIS — E785 Hyperlipidemia, unspecified: Secondary | ICD-10-CM | POA: Diagnosis not present

## 2022-07-29 DIAGNOSIS — R030 Elevated blood-pressure reading, without diagnosis of hypertension: Secondary | ICD-10-CM

## 2022-07-29 DIAGNOSIS — J9611 Chronic respiratory failure with hypoxia: Secondary | ICD-10-CM | POA: Diagnosis not present

## 2022-07-29 DIAGNOSIS — M79604 Pain in right leg: Secondary | ICD-10-CM | POA: Diagnosis not present

## 2022-07-29 LAB — CALPROTECTIN, FECAL: Calprotectin, Fecal: 184 ug/g — ABNORMAL HIGH (ref 0–120)

## 2022-07-29 MED ORDER — MESALAMINE 1.2 G PO TBEC: 4.8000 g | DELAYED_RELEASE_TABLET | Freq: Every day | ORAL | 3 refills | Status: DC

## 2022-07-29 MED ORDER — ISOSORBIDE MONONITRATE ER 60 MG PO TB24: 60.0000 mg | ORAL_TABLET | Freq: Every day | ORAL | 3 refills | Status: DC

## 2022-07-29 NOTE — Patient Instructions (Signed)
Medication Instructions:  Increase Imdur 60 mg daily  *If you need a refill on your cardiac medications before your next appointment, please call your pharmacy*   Lab Work: Lipid & CMET a your convenience   Testing/Procedures: NONE ordered at this time of appointment    Follow-Up: At [facility], you and your health needs are our priority.  As part of our continuing mission to provide you with exceptional heart care, we have created designated Provider Care Teams.  These Care Teams include your primary Cardiologist (physician) and Advanced Practice Providers (APPs -  Physician Assistants and Nurse Practitioners) who all work together to provide you with the care you need, when you need it.  We recommend signing up for the patient portal called "MyChart".  Sign up information is provided on this After Visit Summary.  MyChart is used to connect with patients for Virtual Visits (Telemedicine).  Patients are able to view lab/test results, encounter notes, upcoming appointments, etc.  Non-urgent messages can be sent to your provider as well.   To learn more about what you can do with MyChart, go to [email or web address].    Your next appointment:   3-4 month(s)  Provider:   Lorine Bears, MD    Other Instructions

## 2022-07-29 NOTE — Progress Notes (Signed)
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 CAD/atypical chest pain: Cath in 2013 showed no significant CAD. Lexiscan Myoview in 2016 showed no ischemia, EF 45%. Echo in 2016 showed normal LV function, no wall motion abnormality.  Coronary CT angiogram in 08/2021 revealed calcium score of 1848, 99 percentile, with concern for obstructive plaque in the LAD, however, FFR was negative with the exception of a most distal part of the apical LAD. Medical management was advised.  He notes intermittent chest tightness with exertion and at rest, recently improved with Lidoderm patch.  He has not been taking his Imdur for the past 1 month but he did notice some improvement in his symptoms with Imdur.  Will increase Imdur to 60 mg daily.  If symptoms persist despite medication titration, consider further ischemic evaluation.  He is not an ideal candidate for stress test given COPD.  Will defer  for now. Continue to monitor symptoms.  Reviewed ED precautions.  Continue  Crestor.   2. Leg pain/former tobacco use: He notes a history of intermittent leg cramping/leg pain, coolness to his lower extremities.  ABIs were normal. Overall, stable.     3. Elevated blood pressure: BP well controlled.  No indication for medication at this time.   3. Hyperlipidemia: LDL was 136 in 10/2021.  He was started on Crestor.  Repeat labs were ordered but never completed by patient.  Will update orders for fasting lipid panel, CMET.   4. Lung cancer/COPD/chronic hypoxemic respiratory failure: On 2L home O2. Stable chronic dyspnea. Following with pulmonology.   6. Disposition: Follow-up in 3-4 months with Dr. Kirke Corin.   Joylene Grapes, NP 07/29/2022, 5:11 PM

## 2022-07-31 DIAGNOSIS — M5416 Radiculopathy, lumbar region: Secondary | ICD-10-CM | POA: Diagnosis not present

## 2022-07-31 DIAGNOSIS — G8929 Other chronic pain: Secondary | ICD-10-CM | POA: Diagnosis not present

## 2022-07-31 DIAGNOSIS — M533 Sacrococcygeal disorders, not elsewhere classified: Secondary | ICD-10-CM | POA: Diagnosis not present

## 2022-08-01 ENCOUNTER — Telehealth: Payer: Self-pay | Admitting: Primary Care

## 2022-08-01 ENCOUNTER — Ambulatory Visit (INDEPENDENT_AMBULATORY_CARE_PROVIDER_SITE_OTHER): Payer: 59 | Admitting: Primary Care

## 2022-08-01 ENCOUNTER — Encounter: Payer: Self-pay | Admitting: Primary Care

## 2022-08-01 DIAGNOSIS — J449 Chronic obstructive pulmonary disease, unspecified: Secondary | ICD-10-CM | POA: Diagnosis not present

## 2022-08-01 DIAGNOSIS — J9621 Acute and chronic respiratory failure with hypoxia: Secondary | ICD-10-CM | POA: Diagnosis not present

## 2022-08-01 DIAGNOSIS — J439 Emphysema, unspecified: Secondary | ICD-10-CM | POA: Diagnosis not present

## 2022-08-01 DIAGNOSIS — J432 Centrilobular emphysema: Secondary | ICD-10-CM

## 2022-08-01 MED ORDER — DOXYCYCLINE HYCLATE 100 MG PO TABS: ORAL_TABLET | ORAL | 3 refills | Status: DC

## 2022-08-01 MED ORDER — PREDNISONE 20 MG PO TABS
20.0000 mg | ORAL_TABLET | Freq: Every day | ORAL | 3 refills | Status: DC
Start: 2022-08-01 — End: 2022-08-22

## 2022-08-01 MED ORDER — CEFUROXIME AXETIL 250 MG PO TABS: ORAL_TABLET | ORAL | 3 refills | Status: DC

## 2022-08-01 MED ORDER — AZITHROMYCIN 250 MG PO TABS: ORAL_TABLET | ORAL | 3 refills | Status: DC

## 2022-08-01 NOTE — Patient Instructions (Addendum)
Recommendation  - Continue Trelegy daily in the morning  - Use nebulizer twice a day for 7-10 days; then as needed daily every 6-8 hours (try using first thing in the morning before Trelegy inhaler) - Resume cyclic antibiotics- take azithromycin second week of July  - Resume prednisone daily  - Take robitussin as needed for cough/congestion  - Start nasal spray as directed for nasal congestion - Use flutter valve three times a day - Continue 2-3L supplemental oxygen 24/7   Antibiotic regimen: Azithromycin- July, October, January, April Ceuroxime- August, November, February, May  Doxycycline - September, December, March, June    GI medications:  Sucralfate (Carafate)  take 1 tablet (1 gram) four times daily for GI tract  Omeprazole (prilosec) 40mg  once daily in the moring before meal if able Ondansetron (zofran) is as needed for nausea/vomitting Simethicone is as needed for gas pain/bloating  Orders: Flutter valve (please give patient)  Follow-up: 1 month with Dr. Delton Coombes or his first available (if nothing available in reasonable time frame please reach out to me first)

## 2022-08-01 NOTE — Telephone Encounter (Signed)
I'll wait to hear back from pharmacy to see if they can meet with patient and if not I will place home health referral

## 2022-08-01 NOTE — Telephone Encounter (Signed)
I am not sure what the pharmacy team does for sure.  If you'd like to place an order, we could send this out & see if we can get it scheduled with on of the home health providers.
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
## 2022-08-01 NOTE — Telephone Encounter (Signed)
Does the pharmacy team do medication reconciliation with patients. This guy needs a lot of help sorting our his medication and findings a regimen to keep him compliant   Or [facility] team is there a home heal nurse that can do this?

## 2022-08-04 NOTE — Telephone Encounter (Signed)
Thank you, no rush

## 2022-08-05 ENCOUNTER — Ambulatory Visit: Payer: 59 | Admitting: Adult Health

## 2022-08-06 ENCOUNTER — Other Ambulatory Visit: Payer: Self-pay

## 2022-08-06 ENCOUNTER — Encounter: Payer: Self-pay | Admitting: Student

## 2022-08-06 ENCOUNTER — Ambulatory Visit (INDEPENDENT_AMBULATORY_CARE_PROVIDER_SITE_OTHER): Payer: 59 | Admitting: Student

## 2022-08-06 VITALS — BP 138/91 | HR 78 | Temp 98.3°F | Ht 69.0 in | Wt 164.3 lb

## 2022-08-06 DIAGNOSIS — R350 Frequency of micturition: Secondary | ICD-10-CM | POA: Diagnosis not present

## 2022-08-06 DIAGNOSIS — Z79899 Other long term (current) drug therapy: Secondary | ICD-10-CM | POA: Diagnosis not present

## 2022-08-06 NOTE — Assessment & Plan Note (Signed)
Continues to have urinary frequency. Denies dysuria or any acute worsening compared to prior OV. He was on Myrbetriq but patient states unsure if he is taking it. At one point during the discussion he stated taking it four times a day. He is not able to manage his medications at home and states he takes some of his medications when he feels he needs it. Will have patient return next week with all his medications to do med reconciliation.
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
## 2022-08-06 NOTE — Patient Instructions (Addendum)
Thank you, [name] for allowing [place] to provide your care today. Today we discussed your generalized pain and medications.  -I am worried about the amount of medications on your list.  -I want you to come back to clinic in 1 week with ALL of your medications/bottles so that we can review them together.  -No changes today to medications because I need to have an accurate list before adding more medications.  -The pains you are having for several weeks appear to be worsened with cough.  -Please make sure to take the Tessalon Perles (benzonatate) three times a day and continue Mucinex for the mucous phlegm.   -Please make sure to bring ALL medication bottles to your next visit.    Referrals ordered today:   Referral Orders         AMB Referral to Pharmacy Medication Management        Follow up:  1 week    Should you have any questions or concerns please call the internal medicine clinic at [phone].    Rana Snare, D.O. [facility]

## 2022-08-06 NOTE — Assessment & Plan Note (Addendum)
Presents today with generalized pain that has been ongoing for several weeks. Endorses back and flank pain and chest wall tenderness which is provoked with cough. Cough is chronic but has more phlegm production. He is unsure what medications he is taking. When asked about how he manages his medications, he states "I don't". States he picks and chooses medications he takes each day but not consistently. I am concerned with polypharmacy and the many medications he is prescribed. He follows with pain specialist at [facility] and he has other specialists at [facility]. He is agreeable to bringing all his medications next week's visit.   Plan -Referral for pharmacy medication management  -Patient to return in 1 week with all meds for complete med reconciliation

## 2022-08-11 ENCOUNTER — Telehealth: Payer: Self-pay

## 2022-08-11 NOTE — Progress Notes (Unsigned)
   Care Guide Note  08/11/2022 Name: [name] MRN: [id number] DOB: [birth year]  Referred by: Monna Fam, MD Reason for referral : Chronic Care Management (Outreach to schedule with Pharm d )   [name] is a 67 y.o. year old male who is a primary care patient of Monna Fam, MD. [name] was referred to the pharmacist for assistance related to HTN and HLD.    An unsuccessful telephone outreach was attempted today to contact the patient who was referred to the pharmacy team for assistance with medication management. Additional attempts will be made to contact the patient.   Penne Lash, RMA Care Guide [facility]  [place], [place] [address] Direct Dial: [phone] [email or web address]

## 2022-08-12 ENCOUNTER — Encounter: Payer: 59 | Admitting: Student

## 2022-08-12 DIAGNOSIS — M48062 Spinal stenosis, lumbar region with neurogenic claudication: Secondary | ICD-10-CM | POA: Diagnosis not present

## 2022-08-12 DIAGNOSIS — M461 Sacroiliitis, not elsewhere classified: Secondary | ICD-10-CM | POA: Diagnosis not present

## 2022-08-12 DIAGNOSIS — M5416 Radiculopathy, lumbar region: Secondary | ICD-10-CM | POA: Diagnosis not present

## 2022-08-12 DIAGNOSIS — M48061 Spinal stenosis, lumbar region without neurogenic claudication: Secondary | ICD-10-CM | POA: Diagnosis not present

## 2022-08-12 NOTE — Progress Notes (Signed)
Internal Medicine Clinic Attending  Case discussed with the resident at the time of the visit.  We reviewed the resident's history and exam and pertinent patient test results.  I agree with the assessment, diagnosis, and plan of care documented in the resident's note.  

## 2022-08-14 ENCOUNTER — Ambulatory Visit (INDEPENDENT_AMBULATORY_CARE_PROVIDER_SITE_OTHER): Payer: 59 | Admitting: Student

## 2022-08-14 ENCOUNTER — Encounter: Payer: Self-pay | Admitting: Student

## 2022-08-14 ENCOUNTER — Other Ambulatory Visit: Payer: Self-pay

## 2022-08-14 VITALS — BP 125/90 | HR 68 | Temp 97.8°F | Resp 28 | Ht 69.0 in | Wt 165.9 lb

## 2022-08-14 DIAGNOSIS — R5381 Other malaise: Secondary | ICD-10-CM

## 2022-08-14 DIAGNOSIS — I10 Essential (primary) hypertension: Secondary | ICD-10-CM | POA: Diagnosis not present

## 2022-08-14 DIAGNOSIS — I700 Atherosclerosis of aorta: Secondary | ICD-10-CM | POA: Diagnosis not present

## 2022-08-14 DIAGNOSIS — G894 Chronic pain syndrome: Secondary | ICD-10-CM

## 2022-08-14 DIAGNOSIS — R350 Frequency of micturition: Secondary | ICD-10-CM | POA: Diagnosis not present

## 2022-08-14 DIAGNOSIS — Z79899 Other long term (current) drug therapy: Secondary | ICD-10-CM

## 2022-08-14 NOTE — Patient Instructions (Addendum)
Thank you, [name] for allowing [place] to provide your care today. Today we discussed medications and motorized wheelchair.  -You can continue with Flomax (tamsulosin) 0.4 mg two times a day -Take Myrbetriq 50 mg daily.  -Blood pressure looks stable today.  -Referral sent for evaluation for motorized wheelchair.    -Please make sure to see your other doctors since you have upcoming appointments with them.   Referrals ordered today:   Referral Orders         Ambulatory Referral to Neuro Rehab      Follow up: 2-3 months   Should you have any questions or concerns please call the internal medicine clinic at [phone].    Rana Snare, D.O. [facility]

## 2022-08-14 NOTE — Progress Notes (Signed)
   Care Guide Note  08/14/2022 Name: [name] MRN: [id number] DOB: November 25, 1955  Referred by: Monna Fam, MD Reason for referral : Chronic Care Management (Outreach to schedule with Pharm d )   [name] is a 67 y.o. year old male who is a primary care patient of Monna Fam, MD. [name] was referred to the pharmacist for assistance related to HLD.    Successful contact was made with the patient to discuss pharmacy services including being ready for the pharmacist to call at least 5 minutes before the scheduled appointment time, to have medication bottles and any blood sugar or blood pressure readings ready for review. The patient agreed to meet with the pharmacist via with the pharmacist via telephone visit on (date/time).  08/27/2022  Penne Lash, RMA Care Guide [facility]  [place], [place] [address] Direct Dial: [phone] [email or web address]
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
## 2022-08-14 NOTE — Telephone Encounter (Signed)
Appt has been set with [facility] pharmacist on 08/27/22. If she doesn't have success, I am happy to help out Chesley Mires, PharmD, MPH, BCPS, CPP Clinical Pharmacist (Rheumatology and Pulmonology)

## 2022-08-15 ENCOUNTER — Telehealth: Payer: Self-pay | Admitting: Primary Care

## 2022-08-15 NOTE — Assessment & Plan Note (Signed)
BP controlled today at 125/90 on Imdur 60 mg daily. Denies any acute concerns today regarding this.

## 2022-08-15 NOTE — Telephone Encounter (Signed)
Courtney checking on antibiotics sent to pharmacy. Courtney phone number is [phone].

## 2022-08-15 NOTE — Telephone Encounter (Addendum)
He is to take the prescribed antibiotic the first week of each month listed They are rotating antibiotics every 3 months   AZITHROMYCIN  Take 1 TABLET DAILY x7 days first week of July, October, January, April  Ceftin Take 1 tablet twice daily x 7 days the first week of August, November, February, May  Doxycycline (VIBRA-TABS) 100 MG Take 1 tablet twice daily x 7 days first week of September, December, March, June

## 2022-08-15 NOTE — Assessment & Plan Note (Signed)
Med rec today did not show bottle for rosuvastatin 20 mg. Last LDL was 136 in 2023. Encouraged patient to resume rosuvastatin daily which he states he will try.   Plan -Restart rosuvastatin 20 mg daily

## 2022-08-15 NOTE — Assessment & Plan Note (Signed)
Presents for 1 week f/u for medication reconciliation. He brought the medications he is taking. Bottles include Tramadol 50 mg, cetirizine 10 mg, omeprazole 40 mg, imdur 60 mg, prednisone 20 mg, tamsulosin 0.4 mg, sucralfate 1 g, mesalamine 4.8 g, and Trelegy. I am still hesitant that patient is taking all as prescribed since some are dosed more than once daily.  Appear he has appt scheduled on 7/24 with pharmacist.

## 2022-08-15 NOTE — Telephone Encounter (Signed)
I spoke to the pharmacy and they need a better clarification of how pt should take zithromycin (ZITHROMAX) 250 MG tablet, cefUROXime (CEFTIN) 250 MG tablet, and doxycycline (VIBRA-TABS) 100 MG tablet for the specified months. Sent message to Buelah Manis, NP

## 2022-08-15 NOTE — Telephone Encounter (Signed)
Spoke with patient's pharmacy and gave them the clarification on how to take the antibiotics per [facility]  He is to take the prescribed antibiotic the first week of each month listed They are rotating antibiotics every 3 months    AZITHROMYCIN  Take 1 TABLET DAILY x7 days first week of July, October, January, April   Ceftin Take 1 tablet twice daily x 7 days the first week of August, November, February, May   Doxycycline (VIBRA-TABS) 100 MG Take 1 tablet twice daily x 7 days first week of September, December, March, June    Nothing else further needed at this time.

## 2022-08-15 NOTE — Assessment & Plan Note (Signed)
Presents for 1 week f/u for medication reconciliation and he brought his medication bottles. Upon review, patient is taking tamsulosin 0.4 mg which is prescribed BID but takes it daily. Prior OV note mentioned taking Myrbetriq 50 mg daily but no bottle during reconciliation. States he has other medication bottles at home but those are ones he does not take. PVR in office today was 23 mL. Discussed with patient to take tamsulosin and start the Myrbetriq, patient verbalizes understanding.   Plan -Continue tamsulosin BID  -Patient to check at home for [facility] but he does have refills available at pharmacy -Continue routine f/u with his urologist

## 2022-08-15 NOTE — Assessment & Plan Note (Signed)
Requests for motorized wheelchair. He has chronic low back pain managed by pain specialist at [facility] along with physical deconditioning from his chronic conditions. Will send referral to neuro rehab for evaluation for motorized wheelchair.

## 2022-08-20 ENCOUNTER — Inpatient Hospital Stay (HOSPITAL_COMMUNITY)
Admission: EM | Admit: 2022-08-20 | Discharge: 2022-08-22 | DRG: 193 | Disposition: A | Discharge status: Home / Self Care | Payer: 59 | Attending: Internal Medicine | Admitting: Internal Medicine

## 2022-08-20 ENCOUNTER — Emergency Department (HOSPITAL_COMMUNITY): Payer: 59

## 2022-08-20 ENCOUNTER — Other Ambulatory Visit: Payer: Self-pay

## 2022-08-20 ENCOUNTER — Encounter (HOSPITAL_COMMUNITY): Payer: Self-pay

## 2022-08-20 DIAGNOSIS — I252 Old myocardial infarction: Secondary | ICD-10-CM

## 2022-08-20 DIAGNOSIS — J9621 Acute and chronic respiratory failure with hypoxia: Secondary | ICD-10-CM | POA: Diagnosis not present

## 2022-08-20 DIAGNOSIS — Z66 Do not resuscitate: Secondary | ICD-10-CM | POA: Diagnosis present

## 2022-08-20 DIAGNOSIS — Z981 Arthrodesis status: Secondary | ICD-10-CM | POA: Diagnosis not present

## 2022-08-20 DIAGNOSIS — K219 Gastro-esophageal reflux disease without esophagitis: Secondary | ICD-10-CM | POA: Diagnosis not present

## 2022-08-20 DIAGNOSIS — Z902 Acquired absence of lung [part of]: Secondary | ICD-10-CM

## 2022-08-20 DIAGNOSIS — E785 Hyperlipidemia, unspecified: Secondary | ICD-10-CM | POA: Diagnosis present

## 2022-08-20 DIAGNOSIS — I10 Essential (primary) hypertension: Secondary | ICD-10-CM | POA: Diagnosis present

## 2022-08-20 DIAGNOSIS — I499 Cardiac arrhythmia, unspecified: Secondary | ICD-10-CM | POA: Diagnosis not present

## 2022-08-20 DIAGNOSIS — J441 Chronic obstructive pulmonary disease with (acute) exacerbation: Secondary | ICD-10-CM | POA: Diagnosis not present

## 2022-08-20 DIAGNOSIS — K580 Irritable bowel syndrome with diarrhea: Secondary | ICD-10-CM | POA: Diagnosis not present

## 2022-08-20 DIAGNOSIS — R0600 Dyspnea, unspecified: Secondary | ICD-10-CM | POA: Diagnosis not present

## 2022-08-20 DIAGNOSIS — Z79899 Other long term (current) drug therapy: Secondary | ICD-10-CM | POA: Diagnosis not present

## 2022-08-20 DIAGNOSIS — J439 Emphysema, unspecified: Secondary | ICD-10-CM | POA: Diagnosis not present

## 2022-08-20 DIAGNOSIS — Z8701 Personal history of pneumonia (recurrent): Secondary | ICD-10-CM

## 2022-08-20 DIAGNOSIS — G8929 Other chronic pain: Secondary | ICD-10-CM | POA: Diagnosis not present

## 2022-08-20 DIAGNOSIS — I2510 Atherosclerotic heart disease of native coronary artery without angina pectoris: Secondary | ICD-10-CM | POA: Diagnosis present

## 2022-08-20 DIAGNOSIS — Z9981 Dependence on supplemental oxygen: Secondary | ICD-10-CM | POA: Diagnosis not present

## 2022-08-20 DIAGNOSIS — R079 Chest pain, unspecified: Secondary | ICD-10-CM | POA: Diagnosis not present

## 2022-08-20 DIAGNOSIS — R6889 Other general symptoms and signs: Secondary | ICD-10-CM | POA: Diagnosis not present

## 2022-08-20 DIAGNOSIS — R0602 Shortness of breath: Secondary | ICD-10-CM | POA: Diagnosis not present

## 2022-08-20 DIAGNOSIS — Z905 Acquired absence of kidney: Secondary | ICD-10-CM | POA: Diagnosis not present

## 2022-08-20 DIAGNOSIS — J440 Chronic obstructive pulmonary disease with acute lower respiratory infection: Secondary | ICD-10-CM | POA: Diagnosis not present

## 2022-08-20 DIAGNOSIS — J189 Pneumonia, unspecified organism: Principal | ICD-10-CM | POA: Diagnosis present

## 2022-08-20 DIAGNOSIS — R918 Other nonspecific abnormal finding of lung field: Secondary | ICD-10-CM | POA: Diagnosis not present

## 2022-08-20 DIAGNOSIS — Z87891 Personal history of nicotine dependence: Secondary | ICD-10-CM | POA: Diagnosis not present

## 2022-08-20 DIAGNOSIS — Z85118 Personal history of other malignant neoplasm of bronchus and lung: Secondary | ICD-10-CM | POA: Diagnosis not present

## 2022-08-20 DIAGNOSIS — K589 Irritable bowel syndrome without diarrhea: Secondary | ICD-10-CM | POA: Diagnosis not present

## 2022-08-20 DIAGNOSIS — Z1152 Encounter for screening for COVID-19: Secondary | ICD-10-CM

## 2022-08-20 DIAGNOSIS — R0689 Other abnormalities of breathing: Secondary | ICD-10-CM | POA: Diagnosis not present

## 2022-08-20 LAB — I-STAT VENOUS BLOOD GAS, ED
Acid-base deficit: 1 mmol/L (ref 0.0–2.0)
Bicarbonate: 23.4 mmol/L (ref 20.0–28.0)
Calcium, Ion: 1.2 mmol/L (ref 1.15–1.40)
HCT: 40 % (ref 39.0–52.0)
Hemoglobin: 13.6 g/dL (ref 13.0–17.0)
O2 Saturation: 98 %
Potassium: 3.4 mmol/L — ABNORMAL LOW (ref 3.5–5.1)
Sodium: 138 mmol/L (ref 135–145)
TCO2: 24 mmol/L (ref 22–32)
pCO2, Ven: 37.6 mmHg — ABNORMAL LOW (ref 44–60)
pH, Ven: 7.401 (ref 7.25–7.43)
pO2, Ven: 97 mmHg — ABNORMAL HIGH (ref 32–45)

## 2022-08-20 LAB — COMPREHENSIVE METABOLIC PANEL
ALT: 15 U/L (ref 0–44)
AST: 14 U/L — ABNORMAL LOW (ref 15–41)
Albumin: 3.3 g/dL — ABNORMAL LOW (ref 3.5–5.0)
Alkaline Phosphatase: 76 U/L (ref 38–126)
Anion gap: 13 (ref 5–15)
BUN: 9 mg/dL (ref 8–23)
CO2: 21 mmol/L — ABNORMAL LOW (ref 22–32)
Calcium: 9 mg/dL (ref 8.9–10.3)
Chloride: 101 mmol/L (ref 98–111)
Creatinine, Ser: 1.03 mg/dL (ref 0.61–1.24)
GFR, Estimated: >60 mL/min (ref >60)
Glucose, Bld: 104 mg/dL — ABNORMAL HIGH (ref 70–99)
Potassium: 3.3 mmol/L — ABNORMAL LOW (ref 3.5–5.1)
Sodium: 135 mmol/L (ref 135–145)
Total Bilirubin: 1 mg/dL (ref 0.3–1.2)
Total Protein: 6.3 g/dL — ABNORMAL LOW (ref 6.5–8.1)

## 2022-08-20 LAB — CBC
HCT: 40.5 % (ref 39.0–52.0)
Hemoglobin: 13.1 g/dL (ref 13.0–17.0)
MCH: 30.9 pg (ref 26.0–34.0)
MCHC: 32.3 g/dL (ref 30.0–36.0)
MCV: 95.5 fL (ref 80.0–100.0)
Platelets: 228 10*3/uL (ref 150–400)
RBC: 4.24 MIL/uL (ref 4.22–5.81)
RDW: 15.6 % — ABNORMAL HIGH (ref 11.5–15.5)
WBC: 20 10*3/uL — ABNORMAL HIGH (ref 4.0–10.5)
nRBC: 0 % (ref 0.0–0.2)

## 2022-08-20 LAB — TROPONIN I (HIGH SENSITIVITY)
Troponin I (High Sensitivity): 19 ng/L — ABNORMAL HIGH (ref <18)
Troponin I (High Sensitivity): 14 ng/L (ref <18)

## 2022-08-20 LAB — SARS CORONAVIRUS 2 BY RT PCR: SARS Coronavirus 2 by RT PCR: NEGATIVE

## 2022-08-20 MED ORDER — SODIUM CHLORIDE 0.9 % IV SOLN
500.0000 mg | INTRAVENOUS | Status: DC
  Administered 2022-08-21 – 2022-08-22 (×2): 500 mg via INTRAVENOUS
  Filled 2022-08-20 (×3): qty 5

## 2022-08-20 MED ORDER — PANTOPRAZOLE SODIUM 40 MG PO TBEC
40.0000 mg | DELAYED_RELEASE_TABLET | Freq: Every day | ORAL | Status: DC
  Administered 2022-08-20 – 2022-08-22 (×3): 40 mg via ORAL
  Filled 2022-08-20 (×3): qty 1

## 2022-08-20 MED ORDER — METHYLPREDNISOLONE SODIUM SUCC 125 MG IJ SOLR
125.0000 mg | Freq: Once | INTRAMUSCULAR | Status: AC
  Administered 2022-08-20: 125 mg via INTRAVENOUS
  Filled 2022-08-20: qty 2

## 2022-08-20 MED ORDER — SODIUM CHLORIDE 0.9 % IV SOLN: 1.0000 g | Freq: Once | INTRAVENOUS | Status: DC

## 2022-08-20 MED ORDER — PREDNISONE 20 MG PO TABS
40.0000 mg | ORAL_TABLET | Freq: Every day | ORAL | Status: DC
  Administered 2022-08-21 – 2022-08-22 (×2): 40 mg via ORAL
  Filled 2022-08-20 (×2): qty 2

## 2022-08-20 MED ORDER — ACETAMINOPHEN 325 MG PO TABS
650.0000 mg | ORAL_TABLET | Freq: Four times a day (QID) | ORAL | Status: DC | PRN
  Administered 2022-08-21 – 2022-08-22 (×4): 650 mg via ORAL
  Filled 2022-08-20 (×3): qty 2

## 2022-08-20 MED ORDER — SODIUM CHLORIDE 0.9 % IV SOLN
1.0000 g | Freq: Once | INTRAVENOUS | Status: AC
  Administered 2022-08-20: 1 g via INTRAVENOUS
  Filled 2022-08-20: qty 10

## 2022-08-20 MED ORDER — UMECLIDINIUM BROMIDE 62.5 MCG/ACT IN AEPB
1.0000 | INHALATION_SPRAY | Freq: Every day | RESPIRATORY_TRACT | Status: DC
  Administered 2022-08-21 – 2022-08-22 (×2): 1 via RESPIRATORY_TRACT
  Filled 2022-08-20: qty 7

## 2022-08-20 MED ORDER — ACETAMINOPHEN 650 MG RE SUPP: 650.0000 mg | Freq: Four times a day (QID) | RECTAL | Status: DC | PRN

## 2022-08-20 MED ORDER — SODIUM CHLORIDE 0.9 % IV SOLN
500.0000 mg | Freq: Once | INTRAVENOUS | Status: AC
  Administered 2022-08-20: 500 mg via INTRAVENOUS
  Filled 2022-08-20: qty 5

## 2022-08-20 MED ORDER — ENOXAPARIN SODIUM 40 MG/0.4ML IJ SOSY
40.0000 mg | PREFILLED_SYRINGE | INTRAMUSCULAR | Status: DC
  Administered 2022-08-20 – 2022-08-21 (×2): 40 mg via SUBCUTANEOUS
  Filled 2022-08-20 (×2): qty 0.4

## 2022-08-20 MED ORDER — FLUTICASONE FUROATE-VILANTEROL 200-25 MCG/ACT IN AEPB
1.0000 | INHALATION_SPRAY | Freq: Every day | RESPIRATORY_TRACT | Status: DC
  Administered 2022-08-21 – 2022-08-22 (×2): 1 via RESPIRATORY_TRACT
  Filled 2022-08-20: qty 28

## 2022-08-20 MED ORDER — SODIUM CHLORIDE 0.9 % IV SOLN
1.0000 g | INTRAVENOUS | Status: DC
  Administered 2022-08-21 – 2022-08-22 (×2): 1 g via INTRAVENOUS
  Filled 2022-08-20 (×2): qty 10

## 2022-08-20 MED ORDER — IPRATROPIUM-ALBUTEROL 0.5-2.5 (3) MG/3ML IN SOLN: 3.0000 mL | RESPIRATORY_TRACT | Status: DC | PRN

## 2022-08-20 MED ORDER — ALBUTEROL SULFATE (2.5 MG/3ML) 0.083% IN NEBU
5.0000 mg | INHALATION_SOLUTION | Freq: Once | RESPIRATORY_TRACT | Status: AC
  Administered 2022-08-20: 5 mg via RESPIRATORY_TRACT
  Filled 2022-08-20: qty 6

## 2022-08-20 NOTE — ED Triage Notes (Signed)
Per EMS and pt report, pt has been having chest pain for a couple of days, but today the pain got worse and he started having some tightness. Tried taking tramadol and BC powder with no relief. Was satting at 89% on 2L [place] when the EMS arrived. Put him on 4L [place] and it came up to 94%. Pt was given 324mg  ASA and 0.4mg  nitroglycerin and his pain went from 5/10 to 2/10. Has a hx of past MI, COPD, lung ca, and kidney ca. Pt is also reporting diarrhea that he's had for "awhile". Is working with a GI doc for this.

## 2022-08-20 NOTE — Hospital Course (Addendum)
[name] is a 67 y.o. with a pertinent PMH of COPD on 2L O2, lung cancer s/p lobectomy and VATS, CAD, who presented with SOB and was admitted for pneumonia.  Acute on chronic respiratory failure #Pneumonia #COPD Patient presented with SOB, cough, elevated WBC, and R lower lobe opacity on CXR consistent with pneumonia. Patient was given albuterol, breo ellipta, incruse illipta, and methylpredisolone for acute respiratory failure in ED and returned to his baseline O2 requirement of 2L. He was started on azithromycin 500 mg and ceftriaxone 1 g IV for his pneumonia. Patient's SOB and WBC count improved and O2 requirements were at baseline at time of discharge. Starting Augmentin 875 mg BID for 4 days at discharge, to complete a 7 day course of antibiotics. Continue Trelegy daily and Duoneb PRN for COPD. Follow up with [facility]. His prednisone was also increased to 40 mg during his hospital stay, after a completion of 5 days of this dose, he will return to his 10 mg daily maintenance dose. Additionally, discharged with Azithromycin - this is to be taken the first week of August, per pulm.   #HTN Patient's BP has been elevated in hospital, last measured at 149/83. He has previously taken Imdur 60 mg in the past, unsure if he was taking this at time of admission. Will restart this at discharge.  #IBS Patient has chronic abdominal pain and diarrhea for which he sees  GI. He takes mesalamine and Reglan at home. Symptoms were unchanged in hospital. Follow up with GI.  #GERD Patient has squeezing chest pain worse with eating likely to be reflux. Given Protonix 40 mg for reflux symptoms.  #HLD Last lipid panel showed LDL of 136 on 10/14/21. He is not currently taking lipid lowering medications. Not an acute problem for this hospital stay but will recommend following up with PCP.
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
## 2022-08-20 NOTE — Progress Notes (Signed)
Internal Medicine Clinic Attending  Case discussed with Dr. Zheng  At the time of the visit.  We reviewed the resident's history and exam and pertinent patient test results.  I agree with the assessment, diagnosis, and plan of care documented in the resident's note.  

## 2022-08-20 NOTE — ED Notes (Signed)
Transport contacted

## 2022-08-20 NOTE — ED Provider Notes (Signed)
[place] EMERGENCY DEPARTMENT AT [facility] Provider Note   CSN: [id number] Arrival date & time: 08/20/22  6295     History  Chief Complaint  Patient presents with   Shortness of Breath   Chest Pain    [name] is a 67 y.o. male.  HPI 67 year old male history of COPD, lung cancer, CAD, reflux, chronic respiratory failure with hypoxia, on chronic oxygen, former smoker presents today with increased chest pain.  Patient states he has had ongoing chest pain for period time.  Worsened during the night last night. Patient took tramadol and BC powder without relief.  Patient is chronically on oxygen at 2 L per nasal cannula but sats were low at 89% on EMS arrival.  They increase his oxygen to 4 L nasal cannula sats increased to 94%.  Patient received aspirin prehospital and nitroglycerin that chest pain from 622.    Home Medications Prior to Admission medications   Medication Sig Start Date End Date Taking? Authorizing Provider  acetaminophen (TYLENOL) 500 MG tablet Take 500 mg by mouth every 6 (six) hours as needed for mild pain or headache.     [provider]  albuterol (PROVENTIL) (2.5 MG/3ML) 0.083% nebulizer solution Take 3 mLs (2.5 mg total) by nebulization every 6 (six) hours as needed for wheezing or shortness of breath. 11/27/21   Leslye Peer, MD  albuterol (VENTOLIN HFA) 108 (90 Base) MCG/ACT inhaler INHALE 2 PUFFS BY MOUTH EVERY 6 HOURS AS NEEDED FOR WHEEZING FOR SHORTNESS OF BREATH 07/19/22   Leslye Peer, MD  azithromycin (ZITHROMAX) 250 MG tablet Take the first week July, October, January, April 08/01/22   Glenford Bayley, NP  benzonatate (TESSALON PERLES) 100 MG capsule Take 1 capsule (100 mg total) by mouth 3 (three) times daily as needed for cough. 07/21/22 07/21/23  Steffanie Rainwater, MD  carboxymethylcellulose (REFRESH PLUS) 0.5 % SOLN Place 1 drop into both eyes 3 (three) times daily as needed (dry eyes).    [provider]   cefUROXime (CEFTIN) 250 MG tablet Take the first week of August, November, February, May 08/01/22   Glenford Bayley, NP  cholecalciferol (VITAMIN D3) 25 MCG (1000 UNIT) tablet Take 1 tablet (1,000 Units total) by mouth daily. 06/05/22   Steffanie Rainwater, MD  dicyclomine (BENTYL) 10 MG capsule Take 1 capsule (10 mg total) by mouth in the morning and at bedtime. 11/19/21   Mansouraty, Netty Starring., MD  doxycycline (VIBRA-TABS) 100 MG tablet Take the first week of September, December, March, June 08/01/22   Glenford Bayley, NP  DULoxetine (CYMBALTA) 30 MG capsule Take 1 capsule (30 mg total) by mouth daily. 06/12/22   Rocky Morel, DO  eszopiclone (LUNESTA) 1 MG TABS tablet Take 1 tablet (1 mg total) by mouth at bedtime as needed for sleep. Take immediately before bedtime 03/29/19   Virl Diamond A, MD  fluticasone (FLONASE) 50 MCG/ACT nasal spray Use 2 spray(s) in each nostril once daily 07/09/22   Merrilyn Puma, MD  Fluticasone-Umeclidin-Vilant (TRELEGY ELLIPTA) 200-62.5-25 MCG/ACT AEPB Inhale 1 puff into the lungs daily. 07/17/22   Martina Sinner, MD  isosorbide mononitrate (IMDUR) 60 MG 24 hr tablet Take 1 tablet (60 mg total) by mouth daily. 07/29/22 10/27/22  Joylene Grapes, NP  ketoconazole (NIZORAL) 2 % shampoo Apply 1 application  topically 2 (two) times a week. 08/14/20   [provider]  lidocaine (LIDODERM) 5 % Place 1 patch unto chest wall. Remove &  Discard patch within 12 hours. 07/21/22   Steffanie Rainwater, MD  loperamide (IMODIUM) 2 MG capsule Take 1 capsule (2 mg total) by mouth as needed for diarrhea or loose stools. 02/08/22   Sherryll Burger, Pratik D, DO  meloxicam (MOBIC) 7.5 MG tablet Take 7.5 mg by mouth daily as needed for pain. 01/06/20   [provider]  mesalamine (LIALDA) 1.2 g EC tablet Take 4 tablets (4.8 g total) by mouth daily with breakfast. 07/29/22   Doree Albee, PA-C  metoCLOPramide (REGLAN) 5 MG tablet Take 1 tablet (5 mg total) by mouth every 8  (eight) hours as needed for nausea or vomiting. 07/18/22 10/16/22  Meredith Pel, NP  MYRBETRIQ 50 MG TB24 tablet Take 1 tablet (50 mg total) by mouth daily. 06/04/22   Steffanie Rainwater, MD  naproxen sodium (ALEVE) 220 MG tablet Take 220 mg by mouth daily as needed.    [provider]  nitroGLYCERIN (NITROSTAT) 0.4 MG SL tablet Place 1 tablet (0.4 mg total) under the tongue every 5 (five) minutes as needed for chest pain. 08/19/21 02/05/22  Joylene Grapes, NP  omeprazole (PRILOSEC) 40 MG capsule Take 1 capsule (40 mg total) by mouth daily. 01/06/22   Doree Albee, PA-C  ondansetron (ZOFRAN) 4 MG tablet Take 1 tablet (4 mg total) by mouth every 8 (eight) hours as needed for nausea or vomiting. 04/14/22   Glenford Bayley, NP  OXYGEN Inhale 2 L into the lungs continuous.    [provider]  predniSONE (DELTASONE) 20 MG tablet Take 1 tablet (20 mg total) by mouth daily with breakfast. 08/01/22   Glenford Bayley, NP  pregabalin (LYRICA) 100 MG capsule Take 100 mg by mouth daily. 04/29/19   [provider]  rosuvastatin (CRESTOR) 20 MG tablet Take 1 tablet (20 mg total) by mouth daily. 01/01/22 01/01/23  Steffanie Rainwater, MD  Simethicone 125 MG TABS Take 1 tablet (125 mg total) by mouth 3 (three) times daily as needed. 05/18/22   Steffanie Rainwater, MD  sucralfate (CARAFATE) 1 g tablet Take 1 tablet (1 g total) by mouth 4 (four) times daily -  with meals and at bedtime. 01/06/22 08/14/22  Doree Albee, PA-C  tamsulosin (FLOMAX) 0.4 MG CAPS capsule Take 0.4 mg by mouth 2 (two) times daily. 01/20/19   [provider]  traMADol (ULTRAM) 50 MG tablet Take 50 mg by mouth every 8 (eight) hours as needed for moderate pain. 07/31/22   [provider]      Allergies    Patient has no known allergies.    Review of Systems   Review of Systems  Physical Exam Updated Vital Signs BP 130/88   Pulse 70   Temp 97.9 F (36.6 C)   Resp 18   Ht 1.753 m (5'  9")   Wt 74.4 kg   SpO2 96%   BMI 24.22 kg/m  Physical Exam Vitals and nursing note reviewed.  HENT:     Head: Normocephalic.     Mouth/Throat:     Pharynx: Oropharynx is clear.  Eyes:     Pupils: Pupils are equal, round, and reactive to light.  Cardiovascular:     Rate and Rhythm: Normal rate and regular rhythm.  Pulmonary:     Effort: Tachypnea present.  Abdominal:     Palpations: Abdomen is soft.  Musculoskeletal:        General: Normal range of motion.     Cervical back: Normal  range of motion.     Right lower leg: No tenderness. No edema.     Left lower leg: No tenderness. No edema.  Skin:    General: Skin is warm and dry.  Neurological:     General: No focal deficit present.     Mental Status: He is alert. He is disoriented.  Psychiatric:        Mood and Affect: Mood normal.     ED Results / Procedures / Treatments   Labs (all labs ordered are listed, but only abnormal results are displayed) Labs Reviewed  CBC - Abnormal; Notable for the following components:      Result Value   WBC 20.0 (*)    RDW 15.6 (*)    All other components within normal limits  COMPREHENSIVE METABOLIC PANEL - Abnormal; Notable for the following components:   Potassium 3.3 (*)    CO2 21 (*)    Glucose, Bld 104 (*)    Total Protein 6.3 (*)    Albumin 3.3 (*)    AST 14 (*)    All other components within normal limits  I-STAT VENOUS BLOOD GAS, ED - Abnormal; Notable for the following components:   pCO2, Ven 37.6 (*)    pO2, Ven 97 (*)    Potassium 3.4 (*)    All other components within normal limits  TROPONIN I (HIGH SENSITIVITY) - Abnormal; Notable for the following components:   Troponin I (High Sensitivity) 19 (*)    All other components within normal limits  SARS CORONAVIRUS 2 BY RT PCR  BLOOD GAS, VENOUS  TROPONIN I (HIGH SENSITIVITY)    EKG None ED ECG REPORT   Date: 08/20/2022  Rate: 83  Rhythm:  ectopic atrial  QRS Axis: normal  Intervals: normal  ST/T Wave  abnormalities: nonspecific ST changes  Conduction Disutrbances:none  Narrative Interpretation:   Old EKG Reviewed: unchanged  I have personally reviewed the EKG tracing and agree with the computerized printout as noted.  Radiology DG Chest Port 1 View  Result Date: 08/20/2022 CLINICAL DATA:  dyspnea EXAM: PORTABLE CHEST 1 VIEW COMPARISON:  CXR 07/17/22 FINDINGS: Pleural effusion. No pneumothorax. Normal cardiac and mediastinal contours. Redemonstrated nodular opacity at the left lung apex, unchanged. Compared to prior exam there is new hazy opacity at the right lung base, which could represent atelectasis infection. Radiographically apparent displaced rib fractures. Visualized upper abdomen unremarkable. IMPRESSION: 1. Compared to prior exam there is new hazy opacity at the right lung base, which could represent atelectasis or infection. 2. Redemonstrated nodular opacity at the left lung apex, unchanged. Electronically Signed   By: Lorenza Cambridge M.D.   On: 08/20/2022 10:22    Procedures .Critical Care  Performed by: Margarita Grizzle, MD Authorized by: Margarita Grizzle, MD   Critical care provider statement:    Critical care time (minutes):  45   Critical care was time spent personally by me on the following activities:  Development of treatment plan with patient or surrogate, discussions with consultants, evaluation of patient's response to treatment, examination of patient, ordering and review of laboratory studies, ordering and review of radiographic studies, ordering and performing treatments and interventions, pulse oximetry, re-evaluation of patient's condition and review of old charts     Medications Ordered in ED Medications  azithromycin (ZITHROMAX) 500 mg in sodium chloride 0.9 % 250 mL IVPB (500 mg Intravenous New Bag/Given 08/20/22 1325)  albuterol (PROVENTIL) (2.5 MG/3ML) 0.083% nebulizer solution 5 mg (5 mg Nebulization Given 08/20/22 1010)  methylPREDNISolone sodium succinate  (SOLU-MEDROL) 125 mg/2 mL injection 125 mg (125 mg Intravenous Given 08/20/22 1010)  cefTRIAXone (ROCEPHIN) 1 g in sodium chloride 0.9 % 100 mL IVPB (1 g Intravenous New Bag/Given 08/20/22 1326)    ED Course/ Medical Decision Making/ A&P Clinical Course as of 08/20/22 1402  Wed Aug 20, 2022  1226 Chest x-Adriauna Campton reviewed and interpreted significant for some possible opacity at the right lung base that is near from prior radiologist interpretation concurs [DR]  1242 CBC reviewed interpreted significant leukocytosis with white blood cell count of 20,000 [DR]    Clinical Course User Index [DR] Margarita Grizzle, MD                             Medical Decision Making Amount and/or Complexity of Data Reviewed Labs: ordered. Radiology: ordered.  Risk Prescription drug management.   67 year old man history of COPD, chronic hypoxic respiratory failure on oxygen presents today with increased chest pain and dyspnea.  Patient had decreased sats on EMS arrival and has required increased oxygenation.  On evaluation here patient is evaluated with labs which show Complete metabolic panel is reviewed and interpreted and is significant for mild hypokalemia potassium of 3.3 and CO2 decreased at 21 High-sensitivity troponin reviewed interpreted elevated at 19 1 community-acquired pneumonia patient without systemic symptoms and no indication for blood cultures Rocephin and Zithromax ordered 2 chest pain patient has had some pain which may be attributable to #1.  However, patient was evaluated with EKG and cardiac enzymes initial troponin is elevated with second troponin pending at this time.  Clinically, patient's chest pain has decreased to 2 out of 10 and has since resolved Plan admission for ongoing evaluation and treatment Discussed with internal medicine teaching service and they will see for admission       Final Clinical Impression(s) / ED Diagnoses Final diagnoses:  Community acquired pneumonia of  right lung, unspecified part of lung  Acute on chronic respiratory failure with hypoxia [facility])    Rx / DC Orders ED Discharge Orders     None         Margarita Grizzle, MD 08/20/22 1402

## 2022-08-21 ENCOUNTER — Ambulatory Visit (INDEPENDENT_AMBULATORY_CARE_PROVIDER_SITE_OTHER): Payer: 59 | Admitting: Licensed Clinical Social Worker

## 2022-08-21 DIAGNOSIS — K219 Gastro-esophageal reflux disease without esophagitis: Secondary | ICD-10-CM | POA: Diagnosis not present

## 2022-08-21 DIAGNOSIS — I10 Essential (primary) hypertension: Secondary | ICD-10-CM | POA: Diagnosis not present

## 2022-08-21 DIAGNOSIS — J441 Chronic obstructive pulmonary disease with (acute) exacerbation: Secondary | ICD-10-CM | POA: Diagnosis not present

## 2022-08-21 DIAGNOSIS — J189 Pneumonia, unspecified organism: Secondary | ICD-10-CM | POA: Diagnosis present

## 2022-08-21 DIAGNOSIS — Z9981 Dependence on supplemental oxygen: Secondary | ICD-10-CM | POA: Diagnosis not present

## 2022-08-21 DIAGNOSIS — K589 Irritable bowel syndrome without diarrhea: Secondary | ICD-10-CM

## 2022-08-21 DIAGNOSIS — Z87891 Personal history of nicotine dependence: Secondary | ICD-10-CM | POA: Diagnosis not present

## 2022-08-21 DIAGNOSIS — J9621 Acute and chronic respiratory failure with hypoxia: Secondary | ICD-10-CM | POA: Diagnosis not present

## 2022-08-21 DIAGNOSIS — J439 Emphysema, unspecified: Secondary | ICD-10-CM | POA: Diagnosis not present

## 2022-08-21 DIAGNOSIS — Z79899 Other long term (current) drug therapy: Secondary | ICD-10-CM | POA: Diagnosis not present

## 2022-08-21 DIAGNOSIS — I252 Old myocardial infarction: Secondary | ICD-10-CM | POA: Diagnosis not present

## 2022-08-21 DIAGNOSIS — Z1152 Encounter for screening for COVID-19: Secondary | ICD-10-CM | POA: Diagnosis not present

## 2022-08-21 DIAGNOSIS — J440 Chronic obstructive pulmonary disease with acute lower respiratory infection: Secondary | ICD-10-CM

## 2022-08-21 DIAGNOSIS — K580 Irritable bowel syndrome with diarrhea: Secondary | ICD-10-CM | POA: Diagnosis not present

## 2022-08-21 DIAGNOSIS — R0602 Shortness of breath: Secondary | ICD-10-CM | POA: Diagnosis present

## 2022-08-21 DIAGNOSIS — Z905 Acquired absence of kidney: Secondary | ICD-10-CM | POA: Diagnosis not present

## 2022-08-21 DIAGNOSIS — F32A Depression, unspecified: Secondary | ICD-10-CM | POA: Diagnosis not present

## 2022-08-21 DIAGNOSIS — Z981 Arthrodesis status: Secondary | ICD-10-CM | POA: Diagnosis not present

## 2022-08-21 DIAGNOSIS — G8929 Other chronic pain: Secondary | ICD-10-CM | POA: Diagnosis present

## 2022-08-21 DIAGNOSIS — I2510 Atherosclerotic heart disease of native coronary artery without angina pectoris: Secondary | ICD-10-CM | POA: Diagnosis present

## 2022-08-21 DIAGNOSIS — E785 Hyperlipidemia, unspecified: Secondary | ICD-10-CM | POA: Diagnosis not present

## 2022-08-21 DIAGNOSIS — Z85118 Personal history of other malignant neoplasm of bronchus and lung: Secondary | ICD-10-CM | POA: Diagnosis not present

## 2022-08-21 DIAGNOSIS — Z8701 Personal history of pneumonia (recurrent): Secondary | ICD-10-CM | POA: Diagnosis not present

## 2022-08-21 DIAGNOSIS — Z902 Acquired absence of lung [part of]: Secondary | ICD-10-CM | POA: Diagnosis not present

## 2022-08-21 DIAGNOSIS — Z66 Do not resuscitate: Secondary | ICD-10-CM | POA: Diagnosis not present

## 2022-08-21 LAB — BASIC METABOLIC PANEL
Anion gap: 12 (ref 5–15)
BUN: 9 mg/dL (ref 8–23)
CO2: 20 mmol/L — ABNORMAL LOW (ref 22–32)
Calcium: 8.6 mg/dL — ABNORMAL LOW (ref 8.9–10.3)
Chloride: 104 mmol/L (ref 98–111)
Creatinine, Ser: 1.15 mg/dL (ref 0.61–1.24)
GFR, Estimated: >60 mL/min (ref >60)
Glucose, Bld: 125 mg/dL — ABNORMAL HIGH (ref 70–99)
Potassium: 4 mmol/L (ref 3.5–5.1)
Sodium: 136 mmol/L (ref 135–145)

## 2022-08-21 LAB — CBC
HCT: 43.2 % (ref 39.0–52.0)
Hemoglobin: 13.6 g/dL (ref 13.0–17.0)
MCH: 31.7 pg (ref 26.0–34.0)
MCHC: 31.5 g/dL (ref 30.0–36.0)
MCV: 100.7 fL — ABNORMAL HIGH (ref 80.0–100.0)
Platelets: 207 10*3/uL (ref 150–400)
RBC: 4.29 MIL/uL (ref 4.22–5.81)
RDW: 15.4 % (ref 11.5–15.5)
WBC: 9.6 10*3/uL (ref 4.0–10.5)
nRBC: 0 % (ref 0.0–0.2)

## 2022-08-21 LAB — TROPONIN I (HIGH SENSITIVITY)
Troponin I (High Sensitivity): 11 ng/L (ref <18)
Troponin I (High Sensitivity): 9 ng/L (ref <18)

## 2022-08-21 MED ORDER — CALCIUM CARBONATE ANTACID 500 MG PO CHEW
1.0000 | CHEWABLE_TABLET | Freq: Once | ORAL | Status: AC
  Administered 2022-08-21: 200 mg via ORAL
  Filled 2022-08-21: qty 1

## 2022-08-21 MED ORDER — ONDANSETRON HCL 4 MG/2ML IJ SOLN
4.0000 mg | Freq: Once | INTRAMUSCULAR | Status: AC
  Administered 2022-08-21: 4 mg via INTRAVENOUS
  Filled 2022-08-21: qty 2

## 2022-08-21 MED ORDER — LIDOCAINE 5 % EX PTCH
1.0000 | MEDICATED_PATCH | CUTANEOUS | Status: DC
  Administered 2022-08-21: 1 via TRANSDERMAL
  Filled 2022-08-21: qty 1

## 2022-08-21 MED ORDER — BENZONATATE 100 MG PO CAPS
100.0000 mg | ORAL_CAPSULE | Freq: Once | ORAL | Status: AC
  Administered 2022-08-21: 100 mg via ORAL
  Filled 2022-08-21: qty 1

## 2022-08-21 MED ORDER — ONDANSETRON HCL 4 MG/2ML IJ SOLN
4.0000 mg | Freq: Four times a day (QID) | INTRAMUSCULAR | Status: DC | PRN
  Administered 2022-08-21 – 2022-08-22 (×2): 4 mg via INTRAVENOUS
  Filled 2022-08-21 (×2): qty 2

## 2022-08-21 MED ORDER — ASPIRIN 81 MG PO CHEW
324.0000 mg | CHEWABLE_TABLET | Freq: Once | ORAL | Status: AC
  Administered 2022-08-21: 324 mg via ORAL
  Filled 2022-08-21: qty 4

## 2022-08-21 NOTE — Care Management Obs Status (Cosign Needed)
MEDICARE OBSERVATION STATUS NOTIFICATION   Patient Details  Name: FARON TUDISCO MRN: [id number] Date of Birth: 1955-03-30   Medicare Observation Status Notification Given:  Yes    Janae Bridgeman, RN 08/21/2022, 10:33 AM

## 2022-08-21 NOTE — BH Specialist Note (Signed)
Integrated Behavioral Health via Telemedicine Visit  08/21/2022 [name] [id number]  Number of Integrated Behavioral Health Clinician visits: 3- Third Visit  Session Start time: 0915   Session End time: 1015  Total time in minutes: 60   Referring Provider: Sharrell Ku , MD Patient/Family location: Emergency Department [facility] Provider location: Office All persons participating in visit: Patient and [facility] Types of Service: Telephone visit and General Behavioral Integrated Care (BHI)  I connected with [name] and/or [name]'s via  Telephone or Video Enabled Telemedicine Application  (Video is Caregility application) and verified that I am speaking with the correct person using two identifiers. Discussed confidentiality: Yes   I discussed the limitations of telemedicine and the availability of in person appointments.  Discussed there is a possibility of technology failure and discussed alternative modes of communication if that failure occurs.  I discussed that engaging in this telemedicine visit, they consent to the provision of behavioral healthcare and the services will be billed under their insurance.  Patient and/or legal guardian expressed understanding and consented to Telemedicine visit: Yes   Presenting Concerns: Patient and/or family reports the following symptoms/concerns: Patient currently in hospital. Patient experiencing depression related to health issues and Car repairs. Patient was able to get car repaired.   Patient advised he has friends as his support system. [facility] use motivational interviewing, strength based practice and support counseling as techniques to improve patients mood. Patient laughed and thanked [facility].   [facility] recommended patient to play jazz music while meditating. Patient has access to Pand[employer] via Cell phone.      Patient and/or Family's Strengths/Protective Factors: Sense of purpose  Goals Addressed: Patient will:  Reduce  symptoms of: depression   Increase knowledge and/or ability of: coping skills   Demonstrate ability to: Increase healthy adjustment to current life circumstances  Progress towards Goals: Ongoing  Interventions: Interventions utilized:  Motivational Interviewing, Mindfulness or Management consultant, CBT Cognitive Behavioral Therapy, and Supportive Reflection Standardized Assessments completed: PHQ-SADS  Patient and/or Family Response: Patient thankful for therapy sessions. Patient looking forward to future follow ups.  Assessment: Patient currently experiencing Depression.   Patient may benefit from ongoing therapy..  Plan: Follow up with behavioral health clinician on : within 14 days.   I discussed the assessment and treatment plan with the patient and/or parent/guardian. They were provided an opportunity to ask questions and all were answered. They agreed with the plan and demonstrated an understanding of the instructions.   They were advised to call back or seek an in-person evaluation if the symptoms worsen or if the condition fails to improve as anticipated.  Christen Butter, MSW, LCSW-A She/Her Behavioral Health Clinician [facility]  Internal Medicine Center Direct Dial:[phone]  Fax [phone] Main Office Phone: [phone] [address] [place] [place], [place] [address] Website: [facility] Internal [facility]  [place], [place]  [place]

## 2022-08-21 NOTE — Plan of Care (Signed)

## 2022-08-21 NOTE — Progress Notes (Addendum)
HD#0 SUBJECTIVE:  Patient Summary: [name] is a 67 y.o. with a pertinent PMH of COPD on 2L O2, lung cancer s/p lobectomy and VATS, CAD, who presented with SOB and was admitted for pneumonia.   Overnight Events:  No acute events overnight   Interm History:  Patient laying in bed when entering room. He reports that his breathing is about the same as yesterday and nearly at baseline. Endorses abdominal pain and several Bms which is not new and unchanged from prior to admission. He reports squeezing chest pain that has been chronic. He says it is worse with movement and with eating. Told patient that it is possible that he could be discharged today, but he feels he needs to stay in the hospital another night. Told patient the plan to walk him and monitor his O2 requirements and he is agreeable.   OBJECTIVE:  Vital Signs: Vitals:   08/20/22 1958 08/21/22 0424 08/21/22 0745 08/21/22 0913  BP: (!) 132/94 138/88 125/69   Pulse: 75 (!) 54 (!) 51   Resp: 18 17 16    Temp: 98 F (36.7 C) 98 F (36.7 C) 97.9 F (36.6 C)   TempSrc: Oral  Oral   SpO2: 96% 98% 97% 95%  Weight:      Height:       Supplemental O2: Nasal Cannula SpO2: 95 % O2 Flow Rate (L/min): 3 L/min  Filed Weights   08/20/22 0932 08/20/22 1731  Weight: 74.4 kg 75.8 kg     Intake/Output Summary (Last 24 hours) at 08/21/2022 1144 Last data filed at 08/20/2022 1800 Gross per 24 hour  Intake 591.58 ml  Output 450 ml  Net 141.58 ml   Net IO Since Admission: 141.58 mL [08/21/22 1144]  Physical Exam:  Gen: alert, well appearing, in no acute distress HEENT: normocephalic, atraumatic CV: RRR, no MRG Pulm: normal WOB on 3L [place], crackles at R base and bilateral expiratory wheezes Ext: no lower extremity edema  Patient Lines/Drains/Airways Status     Active Line/Drains/Airways     Name Placement date Placement time Site Days   Peripheral IV 08/20/22 20 G Anterior;Distal;Left;Upper Arm 08/20/22  0927  Arm  1             Pertinent Labs:    Latest Ref Rng & Units 08/20/2022   11:59 PM 08/20/2022   10:26 AM 08/20/2022   10:17 AM  CBC  WBC 4.0 - 10.5 K/uL 9.6   20.0   Hemoglobin 13.0 - 17.0 g/dL 32.4  40.1  02.7   Hematocrit 39.0 - 52.0 % 43.2  40.0  40.5   Platelets 150 - 400 K/uL 207   228        Latest Ref Rng & Units 08/20/2022   11:59 PM 08/20/2022   10:26 AM 08/20/2022   10:17 AM  CMP  Glucose 70 - 99 mg/dL 253   664   BUN 8 - 23 mg/dL 9   9   Creatinine 4.03 - 1.24 mg/dL 4.74   2.59   Sodium 563 - 145 mmol/L 136  138  135   Potassium 3.5 - 5.1 mmol/L 4.0  3.4  3.3   Chloride 98 - 111 mmol/L 104   101   CO2 22 - 32 mmol/L 20   21   Calcium 8.9 - 10.3 mg/dL 8.6   9.0   Total Protein 6.5 - 8.1 g/dL   6.3   Total Bilirubin 0.3 - 1.2 mg/dL   1.0  Alkaline Phos 38 - 126 U/L   76   AST 15 - 41 U/L   14   ALT 0 - 44 U/L   15     Pertinent Imaging: CXR yesterday showed opacities in R lung base.  ASSESSMENT/PLAN:  Assessment: Principal Problem:   Acute on chronic hypoxic respiratory failure (HCC) Active Problems:   COPD with emphysema (HCC)   GERD (gastroesophageal reflux disease)   Hypertension   Irritable bowel syndrome with diarrhea   [name] is a 66 y.o. with pertinent PMH of COPD on 2L O2, lung cancer s/p lobectomy and VATS, CAD, who presented with SOB and admit for pneumonia on hospital day 0  Plan: #Acute on chronic hypoxic respiratory failure #Pneumonia #COPD Patient's SOB has improved since he presented to the ED. He reports that he is nearly back to baseline. WBC now WNL and patient remains afebrile. Continue azithromycin and ceftriaxone. Walking patient today to see his O2 requirements with activity. If patient tolerates well, can plan to discharge tomorrow and finish abx at home. For COPD continue Trelegy daily and Duoneb PRN. Wean to baseline O2 of 2L as tolerated. -Azithromycin 500 mg IV -Ceftriaxone 1g IV -1 puff Trelegy daily -Duoneb PRN -Wean  to 2L O2 as tolerated -Evaluate walking O2 requirements  #HTN BP has mostly been at goal during admission with a few reported Bps in 150s/90s. Will continue to monitor but may start Imdur if Bps are consistently elevated. Patient has expressed that he wants to take as few medications as possible. -Monitor BP -Consider starting Imdur  #GERD Patient describes chest tightness that is chronic and worsens with eating. ECG showed no ST changes. Patient's symptoms may be secondary due to reflux. Continue Protonix 40 mg daily. -Protonix 40 mg  #IBS Patient has chronic diarrhea and abdominal pain followed by [facility]. He reports that this is unchanged since he has been in the hospital. He was prescribed Reglan and mesalamine by GI but unclear if he is compliant to this regimen. Can restart at discharge tomorrow. Also giving him zofran for nausea. -Zofran  Best Practice: Diet: Regular diet IVF: Fluids: none, Rate: None VTE: enoxaparin (LOVENOX) injection 40 mg Start: 08/20/22 1415 Code: DNR AB: azithromycin 500mg , ceftriaxone 1g Therapy Recs: Pending, DME: none Contact: Noni Penland, not contacted DISPO: Anticipated discharge tomorrow to Home pending  improved respiratory condition .  Signature: Randell Patient, Medical Student   Please contact the on call pager after 5 pm and on weekends at [phone].

## 2022-08-21 NOTE — Plan of Care (Signed)

## 2022-08-21 NOTE — Progress Notes (Addendum)
Transition of Care [facility]) - Inpatient Brief Assessment   Patient Details  Name: ALDO SONDGEROTH MRN: [id number] Date of Birth: December 28, 1955  [facility]) CM/SW Contact:    Janae Bridgeman, RN Phone Number: 08/21/2022, 10:44 AM   Clinical Narrative: Patient was admitted for Acute on chronic respiratory failure.  The patient admitted to the hospital from home - alone and has home oxygen prior to arrive.  TOC CM will continue to follow the patient for TOC needs for home.  I will follow up with the patient for Medicare Observation notice and assessment since medical team is presently at the bedside.  Patient was declined for personal care assistance through Medicaid in the past. Patient is independent and is able to drive car at baseline  0/86/57 1108 - CM met with the patient at the bedside - provided with Medicare notice.  The patient has active home oxygen - through [employer] - portable tank at bedside.  The patient normally drives - and will attempt to obtain ride from home - otherwise will provide transportation assistance.  Bedside nursing is aware.   Transition of Care Asessment: Insurance and Status: (P) Insurance coverage has been reviewed Patient has primary care physician: (P) Yes Home environment has been reviewed: (P) Yes - Lives in an apartment alone Prior level of function:: (P) Independent Prior/Current Home Services: (P) Current home services (Home oxygen prior to admission to the hospital) Social Determinants of Health Reivew: (P) SDOH reviewed needs interventions Readmission risk has been reviewed: (P) Yes Transition of care needs: (P) transition of care needs identified, TOC will continue to follow

## 2022-08-22 ENCOUNTER — Other Ambulatory Visit (HOSPITAL_COMMUNITY): Payer: Self-pay

## 2022-08-22 DIAGNOSIS — J189 Pneumonia, unspecified organism: Secondary | ICD-10-CM

## 2022-08-22 LAB — BASIC METABOLIC PANEL
Anion gap: 6 (ref 5–15)
BUN: 15 mg/dL (ref 8–23)
CO2: 24 mmol/L (ref 22–32)
Calcium: 8.6 mg/dL — ABNORMAL LOW (ref 8.9–10.3)
Chloride: 107 mmol/L (ref 98–111)
Creatinine, Ser: 1.07 mg/dL (ref 0.61–1.24)
GFR, Estimated: >60 mL/min (ref >60)
Glucose, Bld: 91 mg/dL (ref 70–99)
Potassium: 4.1 mmol/L (ref 3.5–5.1)
Sodium: 137 mmol/L (ref 135–145)

## 2022-08-22 LAB — CBC
HCT: 39.7 % (ref 39.0–52.0)
Hemoglobin: 12.7 g/dL — ABNORMAL LOW (ref 13.0–17.0)
MCH: 31.4 pg (ref 26.0–34.0)
MCHC: 32 g/dL (ref 30.0–36.0)
MCV: 98.3 fL (ref 80.0–100.0)
Platelets: 241 10*3/uL (ref 150–400)
RBC: 4.04 MIL/uL — ABNORMAL LOW (ref 4.22–5.81)
RDW: 15.1 % (ref 11.5–15.5)
WBC: 14.8 10*3/uL — ABNORMAL HIGH (ref 4.0–10.5)
nRBC: 0 % (ref 0.0–0.2)

## 2022-08-22 MED ORDER — PREDNISONE 20 MG PO TABS
ORAL_TABLET | ORAL | 0 refills | Status: DC
  Filled 2022-08-22: qty 34, 32d supply, fill #0

## 2022-08-22 MED ORDER — CEFUROXIME AXETIL 250 MG PO TABS: 250.0000 mg | ORAL_TABLET | Freq: Two times a day (BID) | ORAL | Status: DC

## 2022-08-22 MED ORDER — PREDNISONE 20 MG PO TABS
ORAL_TABLET | ORAL | 0 refills | Status: AC
  Filled 2022-08-22: qty 19, 32d supply, fill #0

## 2022-08-22 MED ORDER — AZITHROMYCIN 250 MG PO TABS
ORAL_TABLET | ORAL | 1 refills | Status: DC
Start: 2022-08-22 — End: 2022-08-25
  Filled 2022-08-22: qty 7, 7d supply, fill #0

## 2022-08-22 MED ORDER — AMOXICILLIN-POT CLAVULANATE 875-125 MG PO TABS
1.0000 | ORAL_TABLET | Freq: Two times a day (BID) | ORAL | 0 refills | Status: AC
  Filled 2022-08-22: qty 8, 4d supply, fill #0

## 2022-08-22 NOTE — Plan of Care (Signed)

## 2022-08-22 NOTE — TOC Progression Note (Signed)
Transition of Care [facility]) - Progression Note    Patient Details  Name: DEVERICK PRUSS MRN: [id number] Date of Birth: 1955-09-26  Transition of Care [facility]) CM/SW Contact  Janae Bridgeman, RN Phone Number: 08/22/2022, 9:35 AM  Clinical Narrative:    CM spoke with bedside nursing this morning and patient will need a RW for home.  DME order placed.  [employer] called to deliver a RW to the patient's hospital room today.   Expected Discharge Plan: Home/Self Care Barriers to Discharge: Continued Medical Work up  Expected Discharge Plan and Services   Discharge Planning Services: CM Consult   Living arrangements for the past 2 months: Apartment                                       Social Determinants of Health (SDOH) Interventions SDOH Screenings   Food Insecurity: No Food Insecurity (08/20/2022)  Housing: Low Risk  (08/20/2022)  Transportation Needs: No Transportation Needs (08/20/2022)  Utilities: Not At Risk (08/20/2022)  Alcohol Screen: Low Risk  (08/20/2021)  Depression (PHQ2-9): Medium Risk (08/14/2022)  Financial Resource Strain: Low Risk  (08/20/2021)  Physical Activity: Inactive (03/26/2022)  Social Connections: Unknown (08/20/2021)  Stress: Stress Concern Present (08/20/2021)  Tobacco Use: Medium Risk (08/20/2022)    Readmission Risk Interventions     No data to display

## 2022-08-22 NOTE — Discharge Summary (Addendum)
Name: [name] MRN: [id number] DOB: March 01, 1955 67 y.o. PCP: Monna Fam, MD  Date of Admission: 08/20/2022  9:19 AM Date of Discharge: 08/22/2022 Attending Physician: Dr.  Dickie La  Discharge Diagnosis: Principal Problem:   Acute on chronic hypoxic respiratory failure (HCC) Active Problems:   COPD with emphysema (HCC)   GERD (gastroesophageal reflux disease)   Hypertension   Irritable bowel syndrome with diarrhea    Discharge Medications: Allergies as of 08/22/2022   No Known Allergies      Medication List     STOP taking these medications    doxycycline 100 MG tablet Commonly known as: VIBRA-TABS   eszopiclone 1 MG Tabs tablet Commonly known as: Lunesta   sucralfate 1 g tablet Commonly known as: Carafate       TAKE these medications    acetaminophen 650 MG CR tablet Commonly known as: TYLENOL Take 1,300 mg by mouth daily.   albuterol (2.5 MG/3ML) 0.083% nebulizer solution Commonly known as: PROVENTIL Take 3 mLs (2.5 mg total) by nebulization every 6 (six) hours as needed for wheezing or shortness of breath.   albuterol 108 (90 Base) MCG/ACT inhaler Commonly known as: VENTOLIN HFA INHALE 2 PUFFS BY MOUTH EVERY 6 HOURS AS NEEDED FOR WHEEZING FOR SHORTNESS OF BREATH   amoxicillin-clavulanate 875-125 MG tablet Commonly known as: AUGMENTIN Take 1 tablet by mouth 2 (two) times daily for 4 days. Start taking on: August 23, 2022   benzonatate 100 MG capsule Commonly known as: Lawyer Take 1 capsule (100 mg total) by mouth 3 (three) times daily as needed for cough.   carboxymethylcellulose 0.5 % Soln Commonly known as: REFRESH PLUS Place 1 drop into both eyes 3 (three) times daily as needed (dry eyes).   cefUROXime 250 MG tablet Commonly known as: CEFTIN Take 1 tablet (250 mg total) by mouth 2 (two) times daily with a meal for 7 days. From August 1-7 Start taking on: September 04, 2022 What changed:  how much to take how to take this when  to take this additional instructions These instructions start on September 04, 2022. If you are unsure what to do until then, ask your doctor or other care provider.   cholecalciferol 25 MCG (1000 UNIT) tablet Commonly known as: VITAMIN D3 Take 1 tablet (1,000 Units total) by mouth daily.   dicyclomine 10 MG capsule Commonly known as: BENTYL Take 1 capsule (10 mg total) by mouth in the morning and at bedtime.   DULoxetine 30 MG capsule Commonly known as: Cymbalta Take 1 capsule (30 mg total) by mouth daily.   fluticasone 50 MCG/ACT nasal spray Commonly known as: FLONASE Use 2 spray(s) in each nostril once daily   isosorbide mononitrate 60 MG 24 hr tablet Commonly known as: IMDUR Take 1 tablet (60 mg total) by mouth daily.   lidocaine 5 % Commonly known as: Lidoderm Place 1 patch unto chest wall. Remove & Discard patch within 12 hours.   meloxicam 7.5 MG tablet Commonly known as: MOBIC Take 7.5 mg by mouth daily as needed for pain.   mesalamine 1.2 g EC tablet Commonly known as: LIALDA Take 4 tablets (4.8 g total) by mouth daily with breakfast.   metoCLOPramide 5 MG tablet Commonly known as: Reglan Take 1 tablet (5 mg total) by mouth every 8 (eight) hours as needed for nausea or vomiting.   Myrbetriq 50 MG Tb24 tablet Generic drug: mirabegron ER Take 1 tablet (50 mg total) by mouth daily.   nitroGLYCERIN 0.4  MG SL tablet Commonly known as: NITROSTAT Place 1 tablet (0.4 mg total) under the tongue every 5 (five) minutes as needed for chest pain.   omeprazole 40 MG capsule Commonly known as: PRILOSEC Take 1 capsule (40 mg total) by mouth daily.   ondansetron 4 MG tablet Commonly known as: Zofran Take 1 tablet (4 mg total) by mouth every 8 (eight) hours as needed for nausea or vomiting.   OXYGEN Inhale 2 L into the lungs continuous.   predniSONE 20 MG tablet Commonly known as: DELTASONE Take 2 tablets (40 mg total) by mouth daily with breakfast for 2 days, THEN 0.5  tablets (10 mg total) daily with breakfast. Start taking on: August 23, 2022 What changed: See the new instructions.   pregabalin 100 MG capsule Commonly known as: LYRICA Take 100 mg by mouth daily.   Simethicone 125 MG Tabs Take 1 tablet (125 mg total) by mouth 3 (three) times daily as needed.   tamsulosin 0.4 MG Caps capsule Commonly known as: FLOMAX Take 0.4 mg by mouth 2 (two) times daily.   traMADol 50 MG tablet Commonly known as: ULTRAM Take 50 mg by mouth every 8 (eight) hours as needed for moderate pain.   Trelegy Ellipta 200-62.5-25 MCG/ACT Aepb Generic drug: Fluticasone-Umeclidin-Vilant Inhale 1 puff into the lungs daily.               Durable Medical Equipment  (From admission, onward)           Start     Ordered   08/22/22 1032  For home use only DME 4 wheeled rolling walker with seat  Once       Question:  Patient needs a walker to treat with the following condition  Answer:  Generalized weakness   08/22/22 1031            Disposition and follow-up:   [name]  At the hospital follow up visit please address:  1.  Follow-up:  a. Pneumonia/COPD: ensure that Augmentin course was completed and he is compliant with Trelegy. Evaluate O2 requirements.  - Discharged with prednisone 40 mg daily x 2 more days, and then he will return to his 10 mg daily dose  - Take Cefitin BID for 7 days during first week of August (per pulm)  - Patient is to take Azithro 250 mg daily x 7 days the first week of July, October, January, and April; and then Cefitin BID x 7 days during the first week of August, November, February, and May; and then Doxycycline 100 mg BID x 7 days during the first week of September, December, March, and June    b. HTN: measure BP, ensure compliance with Imdur   c. IBS: followed by GI. Evaluate symptoms and compliance with mesalamine and reglan.   d. HLD: obtain  lipid panel, consider initiating statin.  2.  Labs / imaging needed at time of follow-up: Lipid panel  3.  Pending labs/ test needing follow-up: none  4.  Medication Changes  Started: Augmentin  Stopped: eszopiclone, sucralfate  Changed: Prednisone (increased to 40 mg daily for 2 more days)  Abx -  Augmentin 875 mg End Date: 08/26/2022  Follow-up Appointments:  Follow-up Information     [facility] Follow up.   Why: [facility] will provide you with a RW before you are discharged home. Contact information: [address] [place] [address] [phone]  Champ Mungo, DO. Go on 09/04/2022.   Specialty: Internal Medicine Why: 2:15 PM appt Contact information: [address] [place] [address] [phone]                 Hospital Course by problem list:  [name] is a 67 y.o. with a pertinent PMH of COPD on 2L O2, lung cancer s/p lobectomy and VATS, CAD, who presented with SOB and was admitted for pneumonia.  Acute on chronic respiratory failure #CAP #COPD Patient presented with SOB, cough, elevated WBC, and R lower lobe opacity on CXR consistent with pneumonia. Patient was given albuterol, breo ellipta, incruse illipta, and methylpredisolone for acute respiratory failure in ED and returned to his baseline O2 requirement of 2L. He was started on azithromycin 500 mg and ceftriaxone 1 g IV for his pneumonia. Patient's SOB and WBC count improved and O2 requirements were at baseline at time of discharge. Starting Augmentin 875 mg BID for 4 days at discharge, to complete a 7 day course of antibiotics. Continue Trelegy daily and Duoneb PRN for COPD. Follow up with [facility]. His prednisone was also increased to 40 mg during his hospital stay, after a completion of 5 days of this dose, he will return to his 10 mg daily maintenance dose. Additionally, discharged with Ceftin - this is to be taken the first week of August, per pulm.    #HTN Patient's BP has been elevated in hospital, last measured at 149/83. He has previously taken Imdur 60 mg in the past, unsure if he was taking this at time of admission. Will restart this at discharge.  #IBS Patient has chronic abdominal pain and diarrhea for which he sees [facility]. He takes mesalamine and Reglan at home. Symptoms were unchanged in hospital. Follow up with GI.  #GERD Patient has squeezing chest pain worse with eating likely to be reflux. Given Protonix 40 mg for reflux symptoms.  #HLD Last lipid panel showed LDL of 136 on 10/14/21. He is not currently taking lipid lowering medications. Not an acute problem for this hospital stay but will recommend following up with PCP.    Discharge Subjective: Patient reports that he is feeling well and that his breathing has returned to baseline. He says he is ready to be discharged and would like to go home as soon as possible. Informed patient of the plan and he is agreeable.  Discharge Exam:   BP (!) 149/83 (BP Location: Left Arm)   Pulse (!) 50   Temp 98.2 F (36.8 C) (Oral)   Resp 16   Ht 5\' 9"  (1.753 m)   Wt 75.8 kg   SpO2 93%   BMI 24.68 kg/m  Constitutional: well-appearing, sitting in bed, in no acute distress HENT: normocephalic atraumatic Cardiovascular: regular rate and rhythm, no m/r/g Pulmonary/Chest: normal work of breathing on supplemental O2, crackles in R base, bilateral expiratory wheezes Psych: normal mood and affect   Pertinent Labs, Studies, and Procedures:     Latest Ref Rng & Units 08/22/2022    2:51 AM 08/20/2022   11:59 PM 08/20/2022   10:26 AM  CBC  WBC 4.0 - 10.5 K/uL 14.8  9.6    Hemoglobin 13.0 - 17.0 g/dL 41.3  24.4  01.0   Hematocrit 39.0 - 52.0 % 39.7  43.2  40.0   Platelets 150 - 400 K/uL 241  207         Latest Ref Rng & Units 08/22/2022    2:51 AM 08/20/2022  11:59 PM 08/20/2022   10:26 AM  CMP  Glucose 70 - 99 mg/dL 91  119    BUN 8 - 23 mg/dL 15  9    Creatinine 1.47 -  1.24 mg/dL 8.29  5.62    Sodium 130 - 145 mmol/L 137  136  138   Potassium 3.5 - 5.1 mmol/L 4.1  4.0  3.4   Chloride 98 - 111 mmol/L 107  104    CO2 22 - 32 mmol/L 24  20    Calcium 8.9 - 10.3 mg/dL 8.6  8.6      DG Chest Port 1 View  Result Date: 08/20/2022 CLINICAL DATA:  dyspnea EXAM: PORTABLE CHEST 1 VIEW COMPARISON:  CXR 07/17/22 FINDINGS: Pleural effusion. No pneumothorax. Normal cardiac and mediastinal contours. Redemonstrated nodular opacity at the left lung apex, unchanged. Compared to prior exam there is new hazy opacity at the right lung base, which could represent atelectasis infection. Radiographically apparent displaced rib fractures. Visualized upper abdomen unremarkable. IMPRESSION: 1. Compared to prior exam there is new hazy opacity at the right lung base, which could represent atelectasis or infection. 2. Redemonstrated nodular opacity at the left lung apex, unchanged. Electronically Signed   By: Lorenza Cambridge M.D.   On: 08/20/2022 10:22     Discharge Instructions: Discharge Instructions     Call MD for:  difficulty breathing, headache or visual disturbances   Complete by: As directed    Call MD for:  persistant dizziness or light-headedness   Complete by: As directed    Call MD for:  temperature >100.4   Complete by: As directed    Diet general   Complete by: As directed    Discharge instructions   Complete by: As directed    [name],  You were hospitalized for pneumonia. We treated you with IV antibiotics and steroids, and your oxygen levels went back to where they usually are, on your 2 L of oxygen. You need to take Augmentin twice a day for 4 more days - this is the antibiotic to treat your pneumonia.  Please also take prednisone 40 mg daily for the next 2 days, and then you can go back to taking 10 mg of prednisone after that, like your pulmonologist was having you do.   We also gave you Azithromycin - this is another antibiotic but you do not need to take this  until August. Your pulmonologist (lung doctor) wants you to take it from August 1st-7th, once a day.   You have an appointment on August 1 at 2:15 PM with Dr. August Saucer at the internal medicine center (on the ground floor of this hospital).  You have an appointment on August 21 with your pulmonologist, at 3:45 PM with Dr. Delton Coombes. These appointments will be very important for you to keep, so we can make sure you are doing better!  If you have any questions or concerns, call our clinic at [phone] or after hours call [phone] and ask for the internal medicine resident on call.   Increase activity slowly   Complete by: As directed        Signed: Chauncey Mann, DO 08/22/2022, 3:16 PM   Pager: [phone]

## 2022-08-22 NOTE — Progress Notes (Signed)
    Durable Medical Equipment  (From admission, onward)           Start     Ordered   08/22/22 1032  For home use only DME 4 wheeled rolling walker with seat  Once       Question:  Patient needs a walker to treat with the following condition  Answer:  Generalized weakness   08/22/22 1031

## 2022-08-22 NOTE — Evaluation (Signed)
Physical Therapy Evaluation/ Discharge Patient Details Name: [name] MRN: [id number] DOB: November 01, 1955 Today's Date: 08/22/2022  History of Present Illness  67 y.o. male admitted 7/17 with SOB, PNA. PMhx: COPD on 2L O2, lung CA s/p lobectomy, CAD, chronic pain, HLD  Clinical Impression  Pt pleasant and reports living alone with friends who can assist intermittently. Pt on 2L at all times at home and currently maintaining 93-95% on 2L with activity. Pt benefits from support of RW during gait and would need rollator for D/C. Educated for walking program and energy conservation with pt voicing understanding. No further therapy needs and will sign off.         Assistance Recommended at Discharge PRN  If plan is discharge home, recommend the following:  Can travel by private vehicle           Equipment Recommendations Rollator (4 wheels)  Recommendations for Other Services       Functional Status Assessment Patient has not had a recent decline in their functional status     Precautions / Restrictions Precautions Precautions: Fall;Other (comment) Precaution Comments: O2 Restrictions Weight Bearing Restrictions: No      Mobility  Bed Mobility Overal bed mobility: Modified Independent                  Transfers Overall transfer level: Modified independent                      Ambulation/Gait Ambulation/Gait assistance: Modified independent (Device/Increase time) Gait Distance (Feet): 550 Feet Assistive device: Rolling walker (2 wheels) Gait Pattern/deviations: Step-through pattern, Decreased stride length   Gait velocity interpretation: 1.31 - 2.62 ft/sec, indicative of limited community ambulator   General Gait Details: pt with steady gait with use of RW with pt maintaining 93-95% on 2l throughout which is pt baseline  Careers information officer     Tilt Bed    Modified Rankin (Stroke Patients Only)       Balance  Overall balance assessment: Mild deficits observed, not formally tested                                           Pertinent Vitals/Pain Pain Assessment Pain Assessment: No/denies pain    Home Living Family/patient expects to be discharged to:: Private residence Living Arrangements: Alone Available Help at Discharge: Friend(s);Available PRN/intermittently Type of Home: Apartment Home Access: Elevator       Home Layout: One level Home Equipment: Wheelchair - manual;Cane - single point      Prior Function Prior Level of Function : Independent/Modified Independent             Mobility Comments: uses cane or RW at all times ADLs Comments: reports independence but with fatigue     Hand Dominance        Extremity/Trunk Assessment   Upper Extremity Assessment Upper Extremity Assessment: Overall WFL for tasks assessed    Lower Extremity Assessment Lower Extremity Assessment: Overall WFL for tasks assessed    Cervical / Trunk Assessment Cervical / Trunk Assessment: Normal  Communication   Communication: No difficulties  Cognition Arousal/Alertness: Awake/alert Behavior During Therapy: WFL for tasks assessed/performed Overall Cognitive Status: Within Functional Limits for tasks assessed  General Comments      Exercises     Assessment/Plan    PT Assessment Patient does not need any further PT services  PT Problem List         PT Treatment Interventions      PT Goals (Current goals can be found in the Care Plan section)  Acute Rehab PT Goals PT Goal Formulation: All assessment and education complete, DC therapy    Frequency       Co-evaluation               AM-PAC PT "6 Clicks" Mobility  Outcome Measure Help needed turning from your back to your side while in a flat bed without using bedrails?: None Help needed moving from lying on your back to sitting on the side of  a flat bed without using bedrails?: None Help needed moving to and from a bed to a chair (including a wheelchair)?: None Help needed standing up from a chair using your arms (e.g., wheelchair or bedside chair)?: None Help needed to walk in hospital room?: None Help needed climbing 3-5 steps with a railing? : None 6 Click Score: 24    End of Session Equipment Utilized During Treatment: Oxygen Activity Tolerance: Patient tolerated treatment well Patient left: in chair;with call bell/phone within reach Nurse Communication: Mobility status PT Visit Diagnosis: Other abnormalities of gait and mobility (R26.89)    Time: 1610-9604 PT Time Calculation (min) (ACUTE ONLY): 25 min   Charges:   PT Evaluation $PT Eval Moderate Complexity: 1 Mod   PT General Charges $$ ACUTE PT VISIT: 1 Visit         Merryl Hacker, PT Acute Rehabilitation Services Office: [phone]   Mauria Asquith B Aribelle Mccosh 08/22/2022, 11:03 AM

## 2022-08-23 ENCOUNTER — Other Ambulatory Visit (HOSPITAL_COMMUNITY): Payer: Self-pay

## 2022-08-23 ENCOUNTER — Telehealth (HOSPITAL_COMMUNITY): Payer: Self-pay | Admitting: Pharmacist

## 2022-08-25 ENCOUNTER — Other Ambulatory Visit (HOSPITAL_COMMUNITY): Payer: Self-pay

## 2022-08-25 ENCOUNTER — Telehealth: Payer: Self-pay

## 2022-08-25 DIAGNOSIS — J441 Chronic obstructive pulmonary disease with (acute) exacerbation: Secondary | ICD-10-CM | POA: Diagnosis not present

## 2022-08-25 NOTE — Patient Outreach (Signed)
  Care Coordination   Note   08/25/2022 Name: [name] MRN: [id number] DOB: [birth year]  This writer spoke with patient earlier today for [facility] call.  Patient requested a call back after 4:00.  When called, phone went to voicemail.  Plan to call on 08/26/22.  Jodelle Gross, RN, BSN, CCM Care Management Coordinator [place] Phone: [phone]/Fax: [phone]

## 2022-08-26 ENCOUNTER — Telehealth: Payer: Self-pay

## 2022-08-26 ENCOUNTER — Ambulatory Visit (INDEPENDENT_AMBULATORY_CARE_PROVIDER_SITE_OTHER): Payer: 59 | Admitting: Licensed Clinical Social Worker

## 2022-08-26 DIAGNOSIS — F32A Depression, unspecified: Secondary | ICD-10-CM | POA: Diagnosis not present

## 2022-08-26 DIAGNOSIS — F419 Anxiety disorder, unspecified: Secondary | ICD-10-CM

## 2022-08-26 NOTE — Transitions of Care (Post Inpatient/ED Visit) (Signed)
   08/26/2022  Name: ERSKIN ZINDA MRN: [id number] DOB: 09/27/1955  Today's TOC FU Call Status: Today's TOC FU Call Status:: Unsuccessful Call (3rd Attempt) Unsuccessful Call (3rd Attempt) Date: 08/26/22  Attempted to reach the patient regarding the most recent Inpatient/ED visit.  Follow Up Plan: No further outreach attempts will be made at this time. We have been unable to contact the patient.  Jodelle Gross, RN, BSN, CCM Care Management Coordinator [place]

## 2022-08-26 NOTE — BH Specialist Note (Signed)
Integrated Behavioral Health via Telemedicine Visit  08/26/2022 [name] [id number]  Number of Integrated Behavioral Health Clinician visits: Additional Visit  Session Start time: 1330   Session End time: 1400  Total time in minutes: 30  Referring Provider: Sharrell Ku , MD Patient/Family location: Emergency Department [facility] Provider location: Office All persons participating in visit: Patient and [facility] Types of Service: Telephone visit and General Behavioral Integrated Care (BHI)   I connected with [name] and/or [name]'s via  Telephone or Video Enabled Telemedicine Application  (Video is Caregility application) and verified that I am speaking with the correct person using two identifiers. Discussed confidentiality: Yes    I discussed the limitations of telemedicine and the availability of in person appointments.  Discussed there is a possibility of technology failure and discussed alternative modes of communication if that failure occurs.   I discussed that engaging in this telemedicine visit, they consent to the provision of behavioral healthcare and the services will be billed under their insurance.   Patient and/or legal guardian expressed understanding and consented to Telemedicine visit: Yes    Presenting Concerns: Patient and/or family reports the following symptoms/concerns: Patient recently released from hospital. Patient stated he feels better but experiencing a lot of anxiety.   [facility] recommended "visualization therapy". Patient enjoys fishing and Photographer. [facility] recommended patient to close eyes and envision being at drag racing or fishing and take deep breaths holding each breathe for 5 seconds before releasing.         Patient and/or Family's Strengths/Protective Factors: Sense of purpose   Goals Addressed: Patient will:  Reduce symptoms of: depression   Increase knowledge and/or ability of: coping skills   Demonstrate ability to:  Increase healthy adjustment to current life circumstances   Progress towards Goals: Ongoing   Interventions: Interventions utilized:  Motivational Interviewing, Mindfulness or Management consultant, CBT Cognitive Behavioral Therapy, and Supportive Reflection Standardized Assessments completed: PHQ-SADS   Patient and/or Family Response: Patient thankful for therapy sessions. Patient looking forward to future follow ups.   Assessment: Patient currently experiencing Depression.    Patient may benefit from ongoing therapy..   Plan: Follow up with behavioral health clinician on : August 8th in person at 2:30pm     I discussed the assessment and treatment plan with the patient and/or parent/guardian. They were provided an opportunity to ask questions and all were answered. They agreed with the plan and demonstrated an understanding of the instructions.   They were advised to call back or seek an in-person evaluation if the symptoms worsen or if the condition fails to improve as anticipated.   Christen Butter, MSW, LCSW-A She/Her Behavioral Health Clinician [facility]  Internal Medicine Center Direct [phone]  Fax [phone] Main Office Phone: [phone] [address] [place] [place], [place] [address] Website: [facility] Internal Medicine [facility]  [place], [place]  Tom Bean

## 2022-08-27 ENCOUNTER — Other Ambulatory Visit (HOSPITAL_COMMUNITY): Payer: Self-pay

## 2022-08-27 ENCOUNTER — Other Ambulatory Visit: Payer: 59

## 2022-08-27 NOTE — Progress Notes (Signed)
   08/27/2022  Patient ID: [name], male   DOB: Sep 18, 1955, 67 y.o.   MRN: [id number]  While dialing out to call patient for scheduled telephone visit, secure chat received stating patient presented to PCP office for today's visit.  He was given my direct number to call me back, which he did.  I apologized for any confusion around the details of the appointment, and we rescheduled to a telephone visit at 1pm tomorrow since he was not home with medications/medication list.  Lenna Gilford, PharmD, DPLA

## 2022-08-28 ENCOUNTER — Other Ambulatory Visit: Payer: 59

## 2022-09-03 ENCOUNTER — Encounter: Payer: Self-pay | Admitting: Gastroenterology

## 2022-09-03 ENCOUNTER — Other Ambulatory Visit: Payer: 59

## 2022-09-03 ENCOUNTER — Ambulatory Visit: Payer: 59 | Admitting: Gastroenterology

## 2022-09-03 VITALS — BP 128/62 | HR 75 | Ht 69.0 in | Wt 166.2 lb

## 2022-09-03 DIAGNOSIS — R195 Other fecal abnormalities: Secondary | ICD-10-CM | POA: Diagnosis not present

## 2022-09-03 DIAGNOSIS — K529 Noninfective gastroenteritis and colitis, unspecified: Secondary | ICD-10-CM | POA: Diagnosis not present

## 2022-09-03 MED ORDER — DIPHENOXYLATE-ATROPINE 2.5-0.025 MG PO TABS: 1.0000 | ORAL_TABLET | Freq: Three times a day (TID) | ORAL | 0 refills | Status: DC | PRN

## 2022-09-03 MED ORDER — RIFAXIMIN 550 MG PO TABS: 550.0000 mg | ORAL_TABLET | Freq: Three times a day (TID) | ORAL | 0 refills | Status: DC
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
## 2022-09-03 NOTE — Patient Instructions (Addendum)
We have sent the following medications to your pharmacy for you to pick up at your convenience: Xifaxan, Lomotil   Your provider has requested that you go to the basement level for lab work before leaving today. Press "B" on the elevator. The lab is located at the first door on the left as you exit the elevator.  Follow up in 6-9 weeks. Office will contact you to schedule. If you have not heard from office in 2-3 weeks regarding appointment, please office at [phone] to schedule.   _______________________________________________________  If your blood pressure at your visit was 140/90 or greater, please contact your primary care physician to follow up on this.  _______________________________________________________  If you are age 67 or older, your body mass index should be between 23-30. Your Body mass index is 24.55 kg/m. If this is out of the aforementioned range listed, please consider follow up with your Primary Care Provider.  If you are age 67 or younger, your body mass index should be between 19-25. Your Body mass index is 24.55 kg/m. If this is out of the aformentioned range listed, please consider follow up with your Primary Care Provider.   ________________________________________________________  The [facility] providers would like to encourage you to use [facility] to communicate with providers for non-urgent requests or questions.  Due to long hold times on the telephone, sending your provider a message by [facility] may be a faster and more efficient way to get a response.  Please allow 48 business hours for a response.  Please remember that this is for non-urgent requests.  _______________________________________________________  Due to recent changes in healthcare laws, you may see the results of your imaging and laboratory studies on MyChart before your provider has had a chance to review them.  We understand that in some cases there may be results that are confusing or  concerning to you. Not all laboratory results come back in the same time frame and the provider may be waiting for multiple results in order to interpret others.  Please give [place] 48 hours in order for your provider to thoroughly review all the results before contacting the office for clarification of your results.   Thank you for choosing me and [facility].  [name]

## 2022-09-04 ENCOUNTER — Encounter: Payer: 59 | Admitting: Internal Medicine

## 2022-09-04 DIAGNOSIS — R350 Frequency of micturition: Secondary | ICD-10-CM | POA: Diagnosis not present

## 2022-09-04 DIAGNOSIS — R3915 Urgency of urination: Secondary | ICD-10-CM | POA: Diagnosis not present

## 2022-09-06 ENCOUNTER — Encounter: Payer: Self-pay | Admitting: Gastroenterology

## 2022-09-11 ENCOUNTER — Telehealth: Payer: Self-pay | Admitting: Emergency Medicine

## 2022-09-11 ENCOUNTER — Ambulatory Visit (INDEPENDENT_AMBULATORY_CARE_PROVIDER_SITE_OTHER): Payer: 59 | Admitting: Licensed Clinical Social Worker

## 2022-09-11 DIAGNOSIS — F32A Depression, unspecified: Secondary | ICD-10-CM | POA: Diagnosis not present

## 2022-09-11 DIAGNOSIS — J9611 Chronic respiratory failure with hypoxia: Secondary | ICD-10-CM

## 2022-09-11 DIAGNOSIS — J432 Centrilobular emphysema: Secondary | ICD-10-CM

## 2022-09-11 DIAGNOSIS — F419 Anxiety disorder, unspecified: Secondary | ICD-10-CM | POA: Diagnosis not present

## 2022-09-11 NOTE — Telephone Encounter (Signed)
Called and spoke with patient. Patient stated his POC has been flashing lights on and off at 3L. Patient stated POC has been feeling hot when he has it on after a few minutes. Patient stated [facility] came to his home today to service his home concentrator and looked at his POC.  Patient stated he was told he needed to contact his provider for a new POC order so they can replace his current one.   Message routed to Dr. Francine Graven to advise on new POC order

## 2022-09-11 NOTE — Telephone Encounter (Signed)
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 Ok to order a new POC.

## 2022-09-11 NOTE — Telephone Encounter (Signed)
Pt states that his POC is not working, Yellow light blinking on & off. S/w someone from [place] and he states he will need to get with his doctor to get a new prescription for new POC, Pt is having trouble breathing. He sounds very short winded, pls advise.

## 2022-09-12 NOTE — Telephone Encounter (Signed)
I have placed an order for a POC.   ATC the patient. LVM for patient to return my call.

## 2022-09-12 NOTE — Telephone Encounter (Signed)
Pt. Calling back to speak to nurse 

## 2022-09-12 NOTE — Telephone Encounter (Signed)
Lm x1 for patient.  

## 2022-09-12 NOTE — Telephone Encounter (Signed)
Returning missed call

## 2022-09-15 NOTE — Progress Notes (Unsigned)
   08/28/22  Patient ID: [name], male   DOB: [birth year], 67 y.o.   MRN: [id number]  S/O Telephone visit for medication review in response to referral from provider with concern in regard to polypharmacy/adherence  Medication Adherence -Reviewed entire medication list with patient and updated in the system based on what patient actually has at home and is taking or not taking -Verified information against fill history at [facility] where he fills prescriptions -Patient appears to be adherent to the following therapies:  Trelegy, Omeprazole, Rosuvastatin, Isosorbide, Myrbetriq and Meloxicam.  He is somewhat adherent in regard to:  Lialda, Duloxetine, and Tamsulosin.  The majority of his other prescribed medications are either acute or as needed. -No polypharmacy identified -Patient is questioning what he is prescribed some of his medications for, specifically Myrbetric -Seeing GI 7/31 -Lipid panel 10/14/21 LDL 136, TG 149 -No A1c on file, but BG values typically wnl per CMP/BMP's -Does not check home BP regularly, and last OV reading 149/83  Medication Adherence -Forwarding note to patient's providers, so they are aware of medications he currently is/is not taking; and I will consult patient if changes need to be make to regimen -Educated patient on administration and indication for Myrbetric -If patient is to still be taking tamsulosin, a new prescription will need to be sent to the pharmacy -Although, he had not been consistenly filling Lialda, this was recently refilled and he stats he is taking this -HLD is uncontrolled, and I do recommend increasing rosuvastatin to 40mg  in an attempt to achieve LDL <90 (70 if possible).  Recommend f/u lipids and LFT's in 12 weeks if initiated. -HTN is also uncontrolled, so patient could benefit from additional therapy such as ACE/ARB or CCB.  Recommend f/u CMP in 4-6 weeks if therapy changed/initiated.  Follow-up:  Per recommendation of  referring provider  Lenna Gilford, PharmD, DPLA

## 2022-09-15 NOTE — Telephone Encounter (Signed)
Agree, ok to order POC based on his prior walk and settings

## 2022-09-16 DIAGNOSIS — Z961 Presence of intraocular lens: Secondary | ICD-10-CM | POA: Diagnosis not present

## 2022-09-16 DIAGNOSIS — H1013 Acute atopic conjunctivitis, bilateral: Secondary | ICD-10-CM | POA: Diagnosis not present

## 2022-09-17 ENCOUNTER — Ambulatory Visit (INDEPENDENT_AMBULATORY_CARE_PROVIDER_SITE_OTHER): Payer: 59 | Admitting: Internal Medicine

## 2022-09-17 VITALS — BP 134/90 | HR 70 | Temp 97.6°F | Ht 69.0 in | Wt 166.6 lb

## 2022-09-17 DIAGNOSIS — F32A Depression, unspecified: Secondary | ICD-10-CM

## 2022-09-17 DIAGNOSIS — Z87891 Personal history of nicotine dependence: Secondary | ICD-10-CM

## 2022-09-17 DIAGNOSIS — E785 Hyperlipidemia, unspecified: Secondary | ICD-10-CM

## 2022-09-17 DIAGNOSIS — I10 Essential (primary) hypertension: Secondary | ICD-10-CM

## 2022-09-17 DIAGNOSIS — J9611 Chronic respiratory failure with hypoxia: Secondary | ICD-10-CM | POA: Diagnosis not present

## 2022-09-17 DIAGNOSIS — R300 Dysuria: Secondary | ICD-10-CM | POA: Diagnosis not present

## 2022-09-17 DIAGNOSIS — F419 Anxiety disorder, unspecified: Secondary | ICD-10-CM | POA: Diagnosis not present

## 2022-09-17 DIAGNOSIS — R350 Frequency of micturition: Secondary | ICD-10-CM

## 2022-09-17 MED ORDER — ESCITALOPRAM OXALATE 10 MG PO TABS: 10.0000 mg | ORAL_TABLET | Freq: Every day | ORAL | 2 refills | Status: DC

## 2022-09-17 NOTE — Progress Notes (Deleted)
Urge incontinence Having to urinate every 5 minutes. Sometimes producing normal urine amount but not always. Some burning and discomfort when urinating. No fever but chills occasionally at baseline. Has been having sweating. Has been going on a while.  Tamsulosin started in July 2024 is making problem worse. Also on myrbetriq.   Last saw urologist a few weeks ago. Thinks it's because of possible nerve compression in low back. Has been sent to neurosurg for back surgery and surgeon doesn't recommend surgery.  Ua in June clean.   Anxiety GAD-7 ***. Plan:
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
## 2022-09-17 NOTE — Patient Instructions (Signed)
[name],  It was nice seeing you today! Thank you for choosing [facility] for your Primary Care.    I have sent a prescription for a medicine called lexapro for anxiety. You will take one tablet daily. I would like you to follow up in about 6 weeks to see how this is helping your anxiety.  I would like you to try taking only myrbetriq and holding off from taking tamsulosin to see if this helps with your urinary urge.  I will let you know if your labs require any changes in medication therapy.  My best, Dr. August Saucer

## 2022-09-18 LAB — URINALYSIS, ROUTINE W REFLEX MICROSCOPIC
Bilirubin, UA: NEGATIVE
Glucose, UA: NEGATIVE
Ketones, UA: NEGATIVE
Leukocytes,UA: NEGATIVE
Nitrite, UA: NEGATIVE
Protein,UA: NEGATIVE
RBC, UA: NEGATIVE
Specific Gravity, UA: <=1.005 — AB (ref 1.005–1.030)
Urobilinogen, Ur: 0.2 mg/dL (ref 0.2–1.0)
pH, UA: 6.5 (ref 5.0–7.5)

## 2022-09-19 NOTE — Assessment & Plan Note (Signed)
Patient recently admitted for pneumonia in setting of COPD. He was discharged on augmentin with end date 08/26/2022, prednisone 40 mg daily for 2 days prior to resuming OP dose of 10 mg daily, and extensive antibiotic regimen outlined by pulmonology: patient is to take Azithro 250 mg daily x 7 days the first week of July, October, January, and April; and then Cefitin BID x 7 days during the first week of August, November, February, and May; and then Doxycycline 100 mg BID x 7 days during the first week of September, December, March, and June   He follows with [facility] pulmonology. OP regimen includes trelegy ellipta 200-62.5-25 mcg. Plan:Continue following with pulmonology.

## 2022-09-19 NOTE — Assessment & Plan Note (Signed)
Last lipid panel 10/2021 with LDL 136, total cholesterol 229. Is on rosuvastatin 20 mg daily OP. Plan:Lipid panel today. Continue rosuvastatin 20 mg daily.

## 2022-09-19 NOTE — Assessment & Plan Note (Signed)
GAD-7 7, PHQ-9 6. Previously on on duloxetine 30 mg daily which in prior OV notes was apparently helpful but patient does not recall this medication or it's improvement of symptoms. Plan:Start lexapro 10 mg daily. Follow up in 6 weeks for reassessment.

## 2022-09-19 NOTE — Assessment & Plan Note (Signed)
>>  ASSESSMENT AND PLAN FOR CHRONIC RESPIRATORY FAILURE WITH HYPOXIA (HCC) WRITTEN ON 09/19/2022  8:16 AM BY DEAN, EMILY, DO  Patient recently admitted for pneumonia in setting of COPD. He was discharged on augmentin with end date 08/26/2022, prednisone 40 mg daily for 2 days prior to resuming OP dose of 10 mg daily, and extensive antibiotic regimen outlined by pulmonology: patient is to take Azithro 250 mg daily x 7 days the first week of July, October, January, and April; and then Cefitin BID x 7 days during the first week of August, November, February, and May; and then Doxycycline 100 mg BID x 7 days during the first week of September, December, March, and June   He follows with [facility] pulmonology. OP regimen includes trelegy ellipta 200-62.5-25 mcg. Plan:Continue following with pulmonology.

## 2022-09-19 NOTE — Assessment & Plan Note (Signed)
BP today 134/90. OP regimen is imdur 60 mg daily. Renal function WNL when last checked 08/2022. Plan:Continue imdur 60 mg daily.

## 2022-09-19 NOTE — Assessment & Plan Note (Signed)
Patient continues to have urge incontinence, causing him to feel the need to go to the restroom every 5-10 minutes. UOP varies. He endorses occasional burning and discomfort with urination. He denies fever but has chills at baseline and has been having some sweats. He follows with urology for this concern and has been referred to neurosurgery however has not been recommended for surgical intervention as a treatment modality. He is on myrbetriq 50 mg daily and per [facility] clinic note in July he was also continued on tamsulosin BID. UA in June was not concerning for infection.  Plan: UA today. Continue myrbetriq 50 mg daily. Trial of stopping tamsulosin 0.4 mg daily.

## 2022-09-20 LAB — LIPID PANEL
Chol/HDL Ratio: 2.5 ratio (ref 0.0–5.0)
Cholesterol, Total: 198 mg/dL (ref 100–199)
HDL: 80 mg/dL (ref >39)
LDL Chol Calc (NIH): 93 mg/dL (ref 0–99)
Triglycerides: 145 mg/dL (ref 0–149)
VLDL Cholesterol Cal: 25 mg/dL (ref 5–40)

## 2022-09-23 ENCOUNTER — Telehealth: Payer: Self-pay | Admitting: *Deleted

## 2022-09-23 NOTE — Telephone Encounter (Signed)
Waiting call back from agency ([phone]) with the total hours per month this patient will be receiving.

## 2022-09-24 ENCOUNTER — Ambulatory Visit (INDEPENDENT_AMBULATORY_CARE_PROVIDER_SITE_OTHER): Payer: 59 | Admitting: Emergency Medicine

## 2022-09-24 ENCOUNTER — Encounter: Payer: Self-pay | Admitting: Emergency Medicine

## 2022-09-24 VITALS — BP 142/88 | HR 83 | Ht 69.0 in | Wt 165.4 lb

## 2022-09-24 DIAGNOSIS — R918 Other nonspecific abnormal finding of lung field: Secondary | ICD-10-CM

## 2022-09-24 DIAGNOSIS — C3490 Malignant neoplasm of unspecified part of unspecified bronchus or lung: Secondary | ICD-10-CM

## 2022-09-24 DIAGNOSIS — J432 Centrilobular emphysema: Secondary | ICD-10-CM

## 2022-09-24 DIAGNOSIS — J9611 Chronic respiratory failure with hypoxia: Secondary | ICD-10-CM

## 2022-09-24 NOTE — Progress Notes (Signed)
Subjective:    Patient ID: [name], male    DOB: April 15, 1955, 67 y.o.   MRN: [id number]  COPD He complains of cough and shortness of breath. There is no wheezing. Pertinent negatives include no ear pain, fever, headaches, postnasal drip, rhinorrhea, sneezing, sore throat or trouble swallowing. His past medical history is significant for COPD.   HPI     ROV 05/28/2022 --[name] is 67, followed for very severe COPD, bilateral surgical resections for adenocarcinomas.  He has severe chronic bronchitic symptoms, has been managed on Trelegy, prednisone with current dose of 20 mg daily.  I had him on rotating azithromycin/cefuroxime/doxycycline, had planned to get him restarted on this in October 2023.  He is on oxygen at 2 L/min.  Finally, we have been following multiple pulmonary nodules that wax and wane, consistent with inflammatory process.  They were decreased in size on the CT chest 11/2021, repeat scan done 05/19/2022 as below.  Currently managed on Trelegy albuterol.  Flonase, Allegra, Prilosec 40 mg daily, Reglan He reports today that he has been dealing with persistent cough and bronchitic symptoms. Also with progressive exertional SOB. He has had incontinence with exertion.  He was given a prescription for Augmentin 04/11/2022. I increased his pred to 20mg  4/8  CT scan of the chest 05/19/22 reviewed by me shows no hilar or mediastinal adenopathy, centrilobular and paraseptal emphysema, surgical scar in the medial right upper lobe and left hilar regions.  There is been a decrease in size of linear left upper lobe nodule, resolution of most of the pre-existing nodules, overall improved  ROV 09/24/2022 --follow-up visit 67 year old gentleman with very severe COPD and prior bilateral surgical resections for adenocarcinomas.  He has chronic hypoxemic respiratory failure (2-3 L/min), severe chronic bronchitic symptoms.  We have managed him on scheduled Trelegy, previously on rotating antibiotics  (azithromycin, cefuroxime, doxycycline) and these were restarted in June when he was seen by Dr. Francine Graven.  His next CT chest is planned for April 2025. He was in the hospital mid July, treated for CAP. He still feels very debilitated. A lot of cough, ? Sometimes he may get choked up with eating. He feels mucous in his throat.                                                                                                                                             Review of Systems  Constitutional:  Negative for fever and unexpected weight change.  HENT:  Positive for congestion. Negative for dental problem, ear pain, nosebleeds, postnasal drip, rhinorrhea, sinus pressure, sneezing, sore throat and trouble swallowing.   Eyes:  Negative for redness and itching.  Respiratory:  Positive for cough and shortness of breath. Negative for chest tightness and wheezing.   Cardiovascular:  Positive for palpitations. Negative for leg swelling.  Gastrointestinal:  Negative for nausea and vomiting.  Genitourinary:  Negative for dysuria.  Musculoskeletal:  Negative for joint swelling.  Skin:  Negative for rash.  Neurological:  Negative for headaches.  Hematological:  Does not bruise/bleed easily.  Psychiatric/Behavioral:  Positive for dysphoric mood. The patient is nervous/anxious.        Objective:   Physical Exam  Vitals:   09/24/22 1524  BP: (!) 142/88  Pulse: 83  SpO2: 94%  Weight: 165 lb 6.4 oz (75 kg)  Height: 5\' 9"  (1.753 m)   Gen: Pleasant, well-nourished, in no distress, depressed affect  ENT: No lesions,  mouth clear,  oropharynx clear, no postnasal drip, evolving some cushingoid facies  Neck: No JVD, no stridor  Lungs: No use of accessory muscles, overall clear, some mild wheeze on forced expiration  Cardiovascular: RRR, heart sounds normal, no murmur or gallops, 1+ peripheral edema  Musculoskeletal: no deformities   Neuro: alert, non focal  Skin: Warm, no lesions or  rashes     Assessment & Plan:  COPD with emphysema (HCC) He has a lot of dyspnea, a lot of chest and abdominal discomfort.  I believe he is approaching palliative care.  He has talked to his PCP about this and a referral is being made.  Plan to continue his prednisone 20 mg, get him restarted on his rotating antibiotics.  This was attempted earlier this year but it does not look like he was able to do so.  Continue his other inhaler regimen  Lung cancer [facility]) Due for repeat CT scan of the chest in April 2025  Chronic respiratory failure with hypoxia (HCC) Continue supplemental oxygen at all times  Multiple pulmonary nodules Repeat CT chest in April 2025 as above     Levy Pupa, MD, PhD 09/24/2022, 4:26 PM [facility] [phone] or if no answer [phone]

## 2022-09-24 NOTE — Progress Notes (Signed)
Internal Medicine Clinic Attending  Case discussed with the resident at the time of the visit.  We reviewed the resident's history and exam and pertinent patient test results.  I agree with the assessment, diagnosis, and plan of care documented in the resident's note.  

## 2022-09-24 NOTE — Assessment & Plan Note (Addendum)
>>  ASSESSMENT AND PLAN FOR COPD WITH EMPHYSEMA (HCC) WRITTEN ON 09/24/2022  4:25 PM BY Nikolaos Maddocks, Les Pou, MD  He has a lot of dyspnea, a lot of chest and abdominal discomfort.  I believe he is approaching palliative care.  He has talked to his PCP about this and a referral is being made.  Plan to continue his prednisone 20 mg, get him restarted on his rotating antibiotics.  This was attempted earlier this year but it does not look like he was able to do so.  Continue his other inhaler regimen  >>ASSESSMENT AND PLAN FOR CHRONIC RESPIRATORY FAILURE WITH HYPOXIA (HCC) WRITTEN ON 11/05/2022  4:57 PM BY Annette Bertelson, Les Pou, MD  >>ASSESSMENT AND PLAN FOR CHRONIC RESPIRATORY FAILURE WITH HYPOXIA (HCC) WRITTEN ON 09/24/2022  4:25 PM BY Damarea Merkel S, MD  Continue supplemental oxygen at all times  >>ASSESSMENT AND PLAN FOR MULTIPLE PULMONARY NODULES WRITTEN ON 09/24/2022  4:25 PM BY Leslye Peer, MD  Repeat CT chest in April 2025 as above

## 2022-09-24 NOTE — Assessment & Plan Note (Addendum)
>>  ASSESSMENT AND PLAN FOR CHRONIC RESPIRATORY FAILURE WITH HYPOXIA (HCC) WRITTEN ON 09/24/2022  4:25 PM BY Dinia Joynt S, MD  Continue supplemental oxygen at all times  >>ASSESSMENT AND PLAN FOR MULTIPLE PULMONARY NODULES WRITTEN ON 09/24/2022  4:25 PM BY Leslye Peer, MD  Repeat CT chest in April 2025 as above

## 2022-09-24 NOTE — Assessment & Plan Note (Signed)
Repeat CT chest in April 2025 as above

## 2022-09-24 NOTE — Assessment & Plan Note (Signed)
Due for repeat CT scan of the chest in April 2025

## 2022-09-24 NOTE — Patient Instructions (Addendum)
Please continue Trelegy 1 inhalation once daily. Keep your albuterol available to use 2 puffs if needed for shortness of breath, chest tightness, wheezing. Continue prednisone 20 mg once daily We want to get you back on your rotating antibiotics: -Doxycycline for first week of month 1 -Cefuroxime for the first week of months 2 -Azithromycin for the first week of month 3 -Repeat Continue your oxygen at 2-3 L/min as you have been using it We will plan to repeat your CT scan of the chest in April 2025 Follow with Dr. Delton Coombes or APP in 3 months to review your status

## 2022-09-25 DIAGNOSIS — J441 Chronic obstructive pulmonary disease with (acute) exacerbation: Secondary | ICD-10-CM | POA: Diagnosis not present

## 2022-09-25 NOTE — BH Specialist Note (Signed)
Integrated Behavioral Health via Telemedicine Visit  09/25/2022 HRISHI EKSTROM [id number]  Number of Integrated Behavioral Health Clinician visits: Additional Visit  Session Start time: 1330   Session End time: 1400  Total time in minutes: 30   Referring Provider: Sharrell Ku , MD Patient/Family location: Emergency Department [facility] Provider location: Office All persons participating in visit: Patient and [facility] Types of Service: Telephone visit and General Behavioral Integrated Care (BHI)   I connected with [name] and/or Chrissie Noa Sobotta's via  Telephone or Video Enabled Telemedicine Application  (Video is Caregility application) and verified that I am speaking with the correct person using two identifiers. Discussed confidentiality: Yes    I discussed the limitations of telemedicine and the availability of in person appointments.  Discussed there is a possibility of technology failure and discussed alternative modes of communication if that failure occurs.   I discussed that engaging in this telemedicine visit, they consent to the provision of behavioral healthcare and the services will be billed under their insurance.   Patient and/or legal guardian expressed understanding and consented to Telemedicine visit: Yes    Presenting Concerns: Patient and/or family reports the following symptoms/concerns:    Patient continues to work on coping mechanisms for his anxiety and depression. [facility] recommended "visualization therapy". Patient enjoys fishing and Photographer. [facility] recommended patient to close eyes and envision being at drag racing or fishing and take deep breaths holding each breathe for 5 seconds before releasing.         Patient and/or Family's Strengths/Protective Factors: Sense of purpose   Goals Addressed: Patient will:  Reduce symptoms of: depression   Increase knowledge and/or ability of: coping skills   Demonstrate ability to: Increase healthy adjustment  to current life circumstances   Progress towards Goals: Ongoing   Interventions: Interventions utilized:  Motivational Interviewing, Mindfulness or Management consultant, CBT Cognitive Behavioral Therapy, and Supportive Reflection Standardized Assessments completed: PHQ-SADS   Patient and/or Family Response: Patient thankful for therapy sessions. Patient looking forward to future follow ups.   Assessment: Patient currently experiencing Depression.    Patient may benefit from ongoing therapy..   Plan: Follow up with behavioral health clinician on : within the next 30 days   I discussed the assessment and treatment plan with the patient and/or parent/guardian. They were provided an opportunity to ask questions and all were answered. They agreed with the plan and demonstrated an understanding of the instructions.   They were advised to call back or seek an in-person evaluation if the symptoms worsen or if the condition fails to improve as anticipated.   Christen Butter, MSW, LCSW-A She/Her Behavioral Health Clinician [facility]  Internal Medicine Center Direct [phone]  Fax [phone] Main Office Phone: [phone] [address] [place] [place], [place] [address] Website: [facility] Internal Medicine [facility]  [place], [place]  [place]

## 2022-09-26 ENCOUNTER — Telehealth: Payer: Self-pay | Admitting: Emergency Medicine

## 2022-09-26 NOTE — Telephone Encounter (Signed)
Pt calling in stating his pharmacy has not rcvd the following medications:  -Doxycycline for first week of month 1 -Cefuroxime for the first week of months 2 -Azithromycin for the first week of month 3  [id number] - [place] ([place] - [address]

## 2022-09-30 ENCOUNTER — Telehealth: Payer: Self-pay | Admitting: *Deleted

## 2022-09-30 NOTE — Telephone Encounter (Signed)
Lvm for patient to return call regarding his denial for pcs . Will need for patient to send [place] copy of the letter. To patient to return call to [phone].

## 2022-09-30 NOTE — Telephone Encounter (Signed)
Spoke with [name] as to the out come of the PCS re quest.. per [name] ,patient was denied/ patien is appealing this.  [name]- [facility] [phone])

## 2022-10-03 MED ORDER — DOXYCYCLINE HYCLATE 100 MG PO TABS: 100.0000 mg | ORAL_TABLET | Freq: Two times a day (BID) | ORAL | 0 refills | Status: DC

## 2022-10-03 MED ORDER — AZITHROMYCIN 250 MG PO TABS: 250.0000 mg | ORAL_TABLET | Freq: Every day | ORAL | 0 refills | Status: DC

## 2022-10-03 MED ORDER — CEFUROXIME AXETIL 250 MG PO TABS: 250.0000 mg | ORAL_TABLET | Freq: Two times a day (BID) | ORAL | 0 refills | Status: DC

## 2022-10-03 NOTE — Telephone Encounter (Signed)
Medications sent to pharmacy

## 2022-10-03 NOTE — Telephone Encounter (Signed)
When we saw him in office, I believe these meds were refilled for him - that was the intention.  The doses are doxycycline 100mg  bid x 1 week Cefurioxime 250mg  bid x 1 week Azithromycin 250mg  every day x 1 week

## 2022-10-03 NOTE — Telephone Encounter (Signed)
Please advise dosage on medications

## 2022-10-04 NOTE — Assessment & Plan Note (Addendum)
-   Has very severe COPD, bilateral surgical resections for adenocarcinomas along with bullous emphysema.  Maintenance regimen includes Trelegy, daily prednisone and rotating antibiotics.  Reviewed medications with patient at length today, 90-day refill would be helpful to enhance compliance.

## 2022-10-09 DIAGNOSIS — R3915 Urgency of urination: Secondary | ICD-10-CM | POA: Diagnosis not present

## 2022-10-09 DIAGNOSIS — R350 Frequency of micturition: Secondary | ICD-10-CM | POA: Diagnosis not present

## 2022-10-14 ENCOUNTER — Telehealth: Payer: Self-pay

## 2022-10-14 NOTE — Telephone Encounter (Signed)
Prior Authorization for patient (Lidocaine 5% patches) came through on cover my meds was submitted with last office notes awaiting approval or denial.  ZOX:[id number]

## 2022-10-15 NOTE — Telephone Encounter (Signed)
Decision:Approved Early Chars (Key: Tobie Poet) PA Case ID #: [id number] Rx #: [id number] Need Help? Call us at [phone] Outcome Approved today by [facility] Medicare 2017 NCPDP Request Reference Number: [id number]. LIDOCAINE PAD 5% is approved through 02/03/2023. Your patient may now fill this prescription and it will be covered. Authorization Expiration Date: 02/03/2023 Drug Lidocaine 5% patches ePA cloud logo Form [employer] Medicare Part D Electronic Prior Authorization Form [phone] NCPDP) Original Claim Info [phone]

## 2022-10-16 DIAGNOSIS — R350 Frequency of micturition: Secondary | ICD-10-CM | POA: Diagnosis not present

## 2022-10-16 DIAGNOSIS — R3915 Urgency of urination: Secondary | ICD-10-CM | POA: Diagnosis not present

## 2022-10-17 ENCOUNTER — Encounter: Payer: Self-pay | Admitting: Pharmacist

## 2022-10-20 ENCOUNTER — Telehealth: Payer: Self-pay | Admitting: *Deleted

## 2022-10-20 NOTE — Telephone Encounter (Signed)
LVM  for [name] ([facility] [phone]) regarding patient's denial for PCS. Las spoke with  [name] patient was going to appeal this. Not able to get in contact with patient to follow up.

## 2022-10-21 ENCOUNTER — Ambulatory Visit: Payer: 59 | Attending: Cardiovascular Disease | Admitting: Cardiovascular Disease
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
## 2022-10-22 ENCOUNTER — Encounter: Payer: Self-pay | Admitting: Cardiovascular Disease

## 2022-10-23 ENCOUNTER — Encounter: Payer: 59 | Admitting: Internal Medicine

## 2022-10-23 DIAGNOSIS — R350 Frequency of micturition: Secondary | ICD-10-CM | POA: Diagnosis not present

## 2022-10-23 DIAGNOSIS — R3915 Urgency of urination: Secondary | ICD-10-CM | POA: Diagnosis not present

## 2022-10-26 DIAGNOSIS — J441 Chronic obstructive pulmonary disease with (acute) exacerbation: Secondary | ICD-10-CM | POA: Diagnosis not present

## 2022-10-28 ENCOUNTER — Encounter (HOSPITAL_COMMUNITY): Payer: Self-pay | Admitting: *Deleted

## 2022-10-28 ENCOUNTER — Other Ambulatory Visit: Payer: Self-pay

## 2022-10-28 ENCOUNTER — Ambulatory Visit (HOSPITAL_COMMUNITY)
Admission: EM | Admit: 2022-10-28 | Discharge: 2022-10-28 | Disposition: A | Discharge status: Home / Self Care | Payer: 59 | Attending: Family Medicine | Admitting: Family Medicine

## 2022-10-28 ENCOUNTER — Telehealth: Payer: Self-pay | Admitting: *Deleted

## 2022-10-28 DIAGNOSIS — I10 Essential (primary) hypertension: Secondary | ICD-10-CM

## 2022-10-28 MED ORDER — LOSARTAN POTASSIUM 25 MG PO TABS: 25.0000 mg | ORAL_TABLET | Freq: Every day | ORAL | 1 refills | Status: DC

## 2022-10-28 MED ORDER — LOSARTAN POTASSIUM 50 MG PO TABS: 50.0000 mg | ORAL_TABLET | Freq: Every day | ORAL | 1 refills | Status: DC
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
## 2022-10-28 NOTE — Discharge Instructions (Addendum)
Losartan 50 mg--TakePlease follow-up with your primary care sometime in the next couple of weeks to recheck your blood pressure. 1 daily for blood pressure.

## 2022-10-28 NOTE — ED Triage Notes (Signed)
Pt sent by Pain clinic because his BP was hight yesterday.  Pt reported the diastolic of 131.  Pt BP in triage 140/103. Pt reports he doe snot take BP meds.

## 2022-10-28 NOTE — Telephone Encounter (Signed)
Patient walk-in to Clinics stating was told to g to Urgent Care on yesterday after his Pain Clinic appointment .  States blood pressure was elevated stated bottom number was 130ish and he was feeling a little dizzy.   Currently not on any Blood pressure medication.   Patient did not go .  Came to Clinics asking to be seen after 3:30 PM.  Informed patient that it may be to late to be seen today as the Clinics are almost over and the doctors are still seeing patient's.  Spoke with Dr. Lafonda Mosses who said that patient should go to the ER or UC.  Patient was advised to go to the ER. Refused to go to the ER and asked to be taken to the [facility] so that he could go to an Urgent Care.

## 2022-10-30 ENCOUNTER — Other Ambulatory Visit: Payer: Self-pay | Admitting: Nurse Practitioner

## 2022-10-30 DIAGNOSIS — R3915 Urgency of urination: Secondary | ICD-10-CM | POA: Diagnosis not present

## 2022-10-30 DIAGNOSIS — R350 Frequency of micturition: Secondary | ICD-10-CM | POA: Diagnosis not present

## 2022-11-05 ENCOUNTER — Ambulatory Visit: Payer: 59 | Admitting: Student

## 2022-11-05 ENCOUNTER — Ambulatory Visit: Payer: 59

## 2022-11-05 ENCOUNTER — Other Ambulatory Visit: Payer: Self-pay

## 2022-11-05 ENCOUNTER — Encounter: Payer: Self-pay | Admitting: Student

## 2022-11-05 VITALS — BP 129/87 | HR 75 | Temp 97.9°F | Ht 69.0 in | Wt 168.0 lb

## 2022-11-05 DIAGNOSIS — Z0000 Encounter for general adult medical examination without abnormal findings: Secondary | ICD-10-CM | POA: Diagnosis not present

## 2022-11-05 DIAGNOSIS — J4489 Other specified chronic obstructive pulmonary disease: Secondary | ICD-10-CM | POA: Diagnosis not present

## 2022-11-05 DIAGNOSIS — R350 Frequency of micturition: Secondary | ICD-10-CM | POA: Diagnosis not present

## 2022-11-05 DIAGNOSIS — I10 Essential (primary) hypertension: Secondary | ICD-10-CM | POA: Diagnosis not present

## 2022-11-05 DIAGNOSIS — J9611 Chronic respiratory failure with hypoxia: Secondary | ICD-10-CM | POA: Diagnosis not present

## 2022-11-05 DIAGNOSIS — J439 Emphysema, unspecified: Secondary | ICD-10-CM | POA: Diagnosis not present

## 2022-11-05 DIAGNOSIS — R110 Nausea: Secondary | ICD-10-CM | POA: Diagnosis not present

## 2022-11-05 DIAGNOSIS — Z87891 Personal history of nicotine dependence: Secondary | ICD-10-CM

## 2022-11-05 DIAGNOSIS — F419 Anxiety disorder, unspecified: Secondary | ICD-10-CM

## 2022-11-05 DIAGNOSIS — G44209 Tension-type headache, unspecified, not intractable: Secondary | ICD-10-CM | POA: Insufficient documentation

## 2022-11-05 DIAGNOSIS — J3489 Other specified disorders of nose and nasal sinuses: Secondary | ICD-10-CM | POA: Diagnosis not present

## 2022-11-05 DIAGNOSIS — R1084 Generalized abdominal pain: Secondary | ICD-10-CM | POA: Diagnosis not present

## 2022-11-05 DIAGNOSIS — F32A Depression, unspecified: Secondary | ICD-10-CM

## 2022-11-05 DIAGNOSIS — E785 Hyperlipidemia, unspecified: Secondary | ICD-10-CM

## 2022-11-05 MED ORDER — ESCITALOPRAM OXALATE 20 MG PO TABS: 20.0000 mg | ORAL_TABLET | Freq: Every day | ORAL | 2 refills | Status: DC

## 2022-11-05 MED ORDER — OMEPRAZOLE 40 MG PO CPDR
40.0000 mg | DELAYED_RELEASE_CAPSULE | Freq: Every day | ORAL | 3 refills | Status: DC
Start: 2022-11-05 — End: 2023-11-09

## 2022-11-05 MED ORDER — ACETAMINOPHEN 325 MG PO TABS
325.0000 mg | ORAL_TABLET | Freq: Once | ORAL | Status: AC
Start: 2022-11-05 — End: 2022-11-05
  Administered 2022-11-05: 325 mg via ORAL

## 2022-11-05 NOTE — Assessment & Plan Note (Signed)
BP Readings from Last 3 Encounters:  11/05/22 129/87  11/05/22 129/87  10/28/22 122/86   Medication include Imdur 60 mg daily. Losartan 50 mg daily started at the ED.  -Will check BMP today

## 2022-11-05 NOTE — Assessment & Plan Note (Signed)
Reports chronic runny nose. No new fevers or chills. No sore throat, congestion. No sick contacts. Patient is taking Flonase.  Patient is on 2 L oxygen at home.  Patient is notified that the oxygen can make his nasal passages dry, so in response to dry nasal passages, he is having runny nose. Per the PE, his nasal passages are dry. And the Flonase is a decongestant that will cause runny nose.  -Placed a referral for humidifier mask for his home oxygen.

## 2022-11-05 NOTE — Assessment & Plan Note (Signed)
Patient is on Lexapro 10 mg daily, reports no changes with mood. He continues to feel anxious and has hard time staying asleep. He is able to fall asleep, however he wakes up every morning between 3-5 AM and has trouble falling asleep. PHQ9 was 5, GAD7 was 0 -Increase Lexipro to 20 mg daily - Will re-evaluate in 3 months

## 2022-11-05 NOTE — Assessment & Plan Note (Signed)
He is on myrbetriq 50 mg daily. Per the last clinic note, tamsulosin 0.4 mg was stopped. Patient reports seeing urologist every Thursday who does a procedure on his leg, "to jump start a nerve". He reports he will be seeing them tomorrow, they are planning to do CT scan. No urologist notes per chart review.  -Continue myrbetriq 50 mg daily -Treatment per urologist

## 2022-11-05 NOTE — Assessment & Plan Note (Signed)
Lab Results  Component Value Date   CHOL 198 09/17/2022   HDL 80 09/17/2022   LDLCALC 93 09/17/2022   TRIG 145 09/17/2022   CHOLHDL 2.5 09/17/2022  - Patient takes rosuvastatin 20 mg daily.

## 2022-11-05 NOTE — Assessment & Plan Note (Signed)
Patient reports headaches that started two weeks ago, band-like. Reports it comes and goes. Denies any eye pain, lacrimation. Patient reports similar headaches in the past. He has tried OTC tylenol and BC powders, with some relief. - Pt advised to take OTC tylenol as needed
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
## 2022-11-05 NOTE — Patient Instructions (Signed)
Thank you, [name] for allowing [place] to provide your care today. Today we discussed   We have placed a consult for palliative care, they will call you. For your anxiety/depression/insomnia -I have sent in a new prescription to your pharmacy, Lexapro 20 mg daily 3.  For your blood pressure: Continue taking Imdur 60 mg daily and losartan 50 mg daily.  Will check your labs today, will call you tomorrow for follow-up. 4.  For your headaches, please continue taking Tylenol as needed for headaches. 5.  For your runny nose, I have put in a consult for a humidifier mask.  I have ordered the following labs for you:  Lab Orders         BMP8+Anion Gap      Tests ordered today:  BMP  Referrals ordered today:   Referral Orders         Amb Referral to Palliative Care         Ambulatory Referral for DME      I have ordered the following medication/changed the following medications:   Stop the following medications: Medications Discontinued During This Encounter  Medication Reason   omeprazole (PRILOSEC) 40 MG capsule Reorder   escitalopram (LEXAPRO) 10 MG tablet      Start the following medications: Meds ordered this encounter  Medications   omeprazole (PRILOSEC) 40 MG capsule    Sig: Take 1 capsule (40 mg total) by mouth daily.    Dispense:  90 capsule    Refill:  3   escitalopram (LEXAPRO) 20 MG tablet    Sig: Take 1 tablet (20 mg total) by mouth daily.    Dispense:  30 tablet    Refill:  2     Follow up: 2-3 months    Remember:   Should you have any questions or concerns please call the internal medicine clinic at [phone].     Jeral Pinch, DO [facility]

## 2022-11-05 NOTE — Addendum Note (Signed)
Addended by: Jeral Pinch on: 11/05/2022 05:11 PM   Modules accepted: Level of Service
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
## 2022-11-05 NOTE — Assessment & Plan Note (Signed)
He is following pulmonologist. Patient is due for a CT scan in April 2025.  Reports SOB that is chronic, worse with activity.   Medication includes:  -Trelegy 1 inhalation once daily -Albuterol as needed for shortness of breath, chest tenderness, wheezing -Prednisone 20 mg daily  Per last pulmonology note, plan to start the patient on rotating antibiotics, held until he gets medication for pain management.  -Continuous oxygen at 2 to 3 L at home. -Palliative consult placed at this visit

## 2022-11-06 DIAGNOSIS — R350 Frequency of micturition: Secondary | ICD-10-CM | POA: Diagnosis not present

## 2022-11-06 DIAGNOSIS — R3915 Urgency of urination: Secondary | ICD-10-CM | POA: Diagnosis not present

## 2022-11-07 LAB — BMP8+ANION GAP
Anion Gap: 15 mmol/L (ref 10.0–18.0)
BUN/Creatinine Ratio: 9 — ABNORMAL LOW (ref 10–24)
BUN: 10 mg/dL (ref 8–27)
CO2: 24 mmol/L (ref 20–29)
Calcium: 9.4 mg/dL (ref 8.6–10.2)
Chloride: 100 mmol/L (ref 96–106)
Creatinine, Ser: 1.06 mg/dL (ref 0.76–1.27)
Glucose: 94 mg/dL (ref 70–99)
Potassium: 4.4 mmol/L (ref 3.5–5.2)
Sodium: 139 mmol/L (ref 134–144)
eGFR: 77 mL/min/{1.73_m2} (ref >59)

## 2022-11-07 NOTE — Progress Notes (Signed)
Internal Medicine Clinic Attending  I was physically present during the key portions of the resident provided service and participated in the medical decision making of patient's management care. I reviewed pertinent patient test results.  The assessment, diagnosis, and plan were formulated together and I agree with the documentation in the resident's note.  Mercie Eon, MD     Patient has end stage COPD with chronic hypoxic respiratory failure. I agree that a Palliative Care referral is appropriate to help clarify goals of care.

## 2022-11-08 ENCOUNTER — Other Ambulatory Visit: Payer: Self-pay | Admitting: Gastroenterology

## 2022-11-10 DIAGNOSIS — M533 Sacrococcygeal disorders, not elsewhere classified: Secondary | ICD-10-CM | POA: Diagnosis not present

## 2022-11-13 ENCOUNTER — Other Ambulatory Visit: Payer: Self-pay | Admitting: Urology

## 2022-11-13 DIAGNOSIS — C642 Malignant neoplasm of left kidney, except renal pelvis: Secondary | ICD-10-CM

## 2022-11-13 DIAGNOSIS — R350 Frequency of micturition: Secondary | ICD-10-CM | POA: Diagnosis not present

## 2022-11-13 DIAGNOSIS — R3915 Urgency of urination: Secondary | ICD-10-CM | POA: Diagnosis not present

## 2022-11-17 ENCOUNTER — Emergency Department (HOSPITAL_BASED_OUTPATIENT_CLINIC_OR_DEPARTMENT_OTHER)
Admission: EM | Admit: 2022-11-17 | Discharge: 2022-11-17 | Disposition: A | Discharge status: Home / Self Care | Payer: 59 | Attending: Emergency Medicine | Admitting: Emergency Medicine

## 2022-11-17 ENCOUNTER — Other Ambulatory Visit: Payer: Self-pay

## 2022-11-17 ENCOUNTER — Emergency Department (HOSPITAL_BASED_OUTPATIENT_CLINIC_OR_DEPARTMENT_OTHER): Payer: 59

## 2022-11-17 ENCOUNTER — Telehealth: Payer: Self-pay | Admitting: *Deleted

## 2022-11-17 DIAGNOSIS — Z85118 Personal history of other malignant neoplasm of bronchus and lung: Secondary | ICD-10-CM | POA: Insufficient documentation

## 2022-11-17 DIAGNOSIS — J984 Other disorders of lung: Secondary | ICD-10-CM | POA: Diagnosis not present

## 2022-11-17 DIAGNOSIS — M545 Low back pain, unspecified: Secondary | ICD-10-CM | POA: Diagnosis not present

## 2022-11-17 DIAGNOSIS — R0602 Shortness of breath: Secondary | ICD-10-CM | POA: Diagnosis not present

## 2022-11-17 DIAGNOSIS — R1084 Generalized abdominal pain: Secondary | ICD-10-CM | POA: Diagnosis not present

## 2022-11-17 DIAGNOSIS — R0789 Other chest pain: Secondary | ICD-10-CM | POA: Diagnosis not present

## 2022-11-17 DIAGNOSIS — R918 Other nonspecific abnormal finding of lung field: Secondary | ICD-10-CM | POA: Diagnosis not present

## 2022-11-17 DIAGNOSIS — J449 Chronic obstructive pulmonary disease, unspecified: Secondary | ICD-10-CM | POA: Insufficient documentation

## 2022-11-17 DIAGNOSIS — G8929 Other chronic pain: Secondary | ICD-10-CM | POA: Insufficient documentation

## 2022-11-17 DIAGNOSIS — R519 Headache, unspecified: Secondary | ICD-10-CM

## 2022-11-17 DIAGNOSIS — J439 Emphysema, unspecified: Secondary | ICD-10-CM

## 2022-11-17 LAB — CBC
HCT: 42.1 % (ref 39.0–52.0)
Hemoglobin: 13.9 g/dL (ref 13.0–17.0)
MCH: 31 pg (ref 26.0–34.0)
MCHC: 33 g/dL (ref 30.0–36.0)
MCV: 93.8 fL (ref 80.0–100.0)
Platelets: 300 10*3/uL (ref 150–400)
RBC: 4.49 MIL/uL (ref 4.22–5.81)
RDW: 15.2 % (ref 11.5–15.5)
WBC: 13.4 10*3/uL — ABNORMAL HIGH (ref 4.0–10.5)
nRBC: 0 % (ref 0.0–0.2)

## 2022-11-17 LAB — COMPREHENSIVE METABOLIC PANEL
ALT: 12 U/L (ref 0–44)
AST: 11 U/L — ABNORMAL LOW (ref 15–41)
Albumin: 3.9 g/dL (ref 3.5–5.0)
Alkaline Phosphatase: 74 U/L (ref 38–126)
Anion gap: 9 (ref 5–15)
BUN: 13 mg/dL (ref 8–23)
CO2: 25 mmol/L (ref 22–32)
Calcium: 9.3 mg/dL (ref 8.9–10.3)
Chloride: 102 mmol/L (ref 98–111)
Creatinine, Ser: 1.04 mg/dL (ref 0.61–1.24)
GFR, Estimated: >60 mL/min (ref >60)
Glucose, Bld: 151 mg/dL — ABNORMAL HIGH (ref 70–99)
Potassium: 4.1 mmol/L (ref 3.5–5.1)
Sodium: 136 mmol/L (ref 135–145)
Total Bilirubin: 0.6 mg/dL (ref 0.3–1.2)
Total Protein: 6.6 g/dL (ref 6.5–8.1)

## 2022-11-17 LAB — URINALYSIS, ROUTINE W REFLEX MICROSCOPIC
Bilirubin Urine: NEGATIVE
Glucose, UA: NEGATIVE mg/dL
Hgb urine dipstick: NEGATIVE
Ketones, ur: NEGATIVE mg/dL
Leukocytes,Ua: NEGATIVE
Nitrite: NEGATIVE
Specific Gravity, Urine: 1.019 (ref 1.005–1.030)
pH: 5.5 (ref 5.0–8.0)

## 2022-11-17 LAB — LACTIC ACID, PLASMA
Lactic Acid, Venous: 2.1 mmol/L (ref 0.5–1.9)
Lactic Acid, Venous: 2.1 mmol/L (ref 0.5–1.9)

## 2022-11-17 LAB — CK: Total CK: 55 U/L (ref 49–397)

## 2022-11-17 MED ORDER — HYDROCODONE-ACETAMINOPHEN 5-325 MG PO TABS
1.0000 | ORAL_TABLET | Freq: Once | ORAL | Status: AC
  Administered 2022-11-17: 1 via ORAL
  Filled 2022-11-17: qty 1

## 2022-11-17 MED ORDER — DIPHENHYDRAMINE HCL 50 MG/ML IJ SOLN
25.0000 mg | Freq: Once | INTRAMUSCULAR | Status: AC
  Administered 2022-11-17: 25 mg via INTRAVENOUS
  Filled 2022-11-17: qty 1

## 2022-11-17 MED ORDER — ALBUTEROL SULFATE HFA 108 (90 BASE) MCG/ACT IN AERS: 2.0000 | INHALATION_SPRAY | RESPIRATORY_TRACT | Status: DC | PRN

## 2022-11-17 MED ORDER — SODIUM CHLORIDE 0.9 % IV BOLUS
1000.0000 mL | Freq: Once | INTRAVENOUS | Status: AC
  Administered 2022-11-17: 1000 mL via INTRAVENOUS

## 2022-11-17 MED ORDER — METOCLOPRAMIDE HCL 5 MG/ML IJ SOLN
10.0000 mg | Freq: Once | INTRAMUSCULAR | Status: AC
  Administered 2022-11-17: 10 mg via INTRAVENOUS
  Filled 2022-11-17: qty 2

## 2022-11-17 NOTE — ED Provider Notes (Addendum)
[place] EMERGENCY DEPARTMENT AT [facility] Provider Note   CSN: [id number] Arrival date & time: 11/17/22  1521     History  Chief Complaint  Patient presents with   Abdominal Pain    [name] is a 67 y.o. male.  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  He states that overall he feels generally unwell.  It is difficult for him to pick 1 complaint which is the worst.  He does complain of a generalized headache that he has had for 3 weeks.  He has had headaches similar to this in the past.  It has caused blurry vision but no loss of vision.  He has subjective fevers and chills, but no documented fevers.  He has a bit of a cough, however also chronic.  He has generalized body pain and generalized abdominal pain.  He has had nausea and the sensation to vomit, but has not vomited.  No dysuria but has had urinary hesitancy.  No skin rashes.  He has chronic back pain for which he sees pain management and receives "injections".  No chest pain or shortness of breath.  He states that sometimes he will increase his oxygen to 3 L, but currently on 2 L.  He states that he has felt bad like this in the past when he has had gotten pneumonia.       Home Medications Prior to Admission medications   Medication Sig Start Date End Date Taking? Authorizing Provider  albuterol (VENTOLIN HFA) 108 (90 Base) MCG/ACT inhaler INHALE 2 PUFFS BY MOUTH EVERY 6 HOURS AS NEEDED FOR WHEEZING FOR SHORTNESS OF BREATH 07/19/22   Leslye Peer, MD  cholecalciferol (VITAMIN D3) 25 MCG (1000 UNIT) tablet Take 1 tablet (1,000 Units total) by mouth daily. 06/05/22   Steffanie Rainwater, MD  dicyclomine (BENTYL) 10 MG capsule TAKE 1 CAPSULE  BY MOUTH 4 TIMES DAILY BEFORE MEAL(S) AND AT BEDTIME 11/10/22   Mansouraty, Netty Starring., MD  diphenoxylate-atropine (LOMOTIL) 2.5-0.025 MG tablet Take 1 tablet by mouth 3 (three) times daily as needed for diarrhea or loose stools. 09/03/22   Mansouraty, Netty Starring., MD  escitalopram (LEXAPRO) 20 MG tablet Take 1 tablet (20 mg total) by mouth daily. 11/05/22 11/05/23  Jeral Pinch, DO  fluticasone (FLONASE) 50 MCG/ACT nasal spray Use 2 spray(s) in each nostril once daily 07/09/22   Briscoe Burns, MD  Fluticasone-Umeclidin-Vilant (TRELEGY ELLIPTA) 200-62.5-25 MCG/ACT AEPB Inhale 1 puff into the lungs daily. 07/17/22   Martina Sinner, MD  isosorbide mononitrate (IMDUR) 60 MG 24 hr tablet Take 1 tablet (60 mg total) by mouth daily. 07/29/22 10/27/22  Joylene Grapes, NP  lidocaine (LIDODERM) 5 % Place 1 patch unto chest wall. Remove & Discard patch within 12 hours. 07/21/22   Steffanie Rainwater, MD  losartan (COZAAR) 25 MG tablet Take 1 tablet (25 mg total) by mouth daily. 10/28/22   Zenia Resides, MD  mesalamine (LIALDA) 1.2 g EC tablet Take 4 tablets (4.8 g total) by mouth daily with breakfast. 07/29/22   Doree Albee, PA-C  metoCLOPramide (REGLAN) 5 MG tablet TAKE 1 TABLET BY MOUTH EVERY 8 HOURS AS NEEDED FOR NAUSEA FOR VOMITING 10/30/22   Meredith Pel, NP  MYRBETRIQ 50 MG  TB24 tablet Take 1 tablet (50 mg total) by mouth daily. 06/04/22   Steffanie Rainwater, MD  nitroGLYCERIN (NITROSTAT) 0.4 MG SL tablet Place 1 tablet (0.4 mg total) under the tongue every 5 (five) minutes as needed for chest pain. 08/19/21 08/20/22  Joylene Grapes, NP  omeprazole (PRILOSEC) 40 MG capsule Take 1 capsule (40 mg total) by mouth daily. 11/05/22   Tawkaliyar, Roya, DO  ondansetron (ZOFRAN) 4 MG tablet Take 1 tablet (4 mg total) by mouth every 8 (eight) hours as needed for nausea or vomiting. 04/14/22   Glenford Bayley, NP  OXYGEN Inhale 2 L into the lungs continuous.    [provider]  rifaximin  (XIFAXAN) 550 MG TABS tablet Take 1 tablet (550 mg total) by mouth 3 (three) times daily. 09/03/22   Mansouraty, Netty Starring., MD  rosuvastatin (CRESTOR) 20 MG tablet Take 20 mg by mouth daily.    [provider]  Simethicone 125 MG TABS Take 1 tablet (125 mg total) by mouth 3 (three) times daily as needed. 05/18/22   Steffanie Rainwater, MD  traMADol (ULTRAM) 50 MG tablet Take 50 mg by mouth every 8 (eight) hours as needed for moderate pain. 07/31/22   [provider]      Allergies    Patient has no known allergies.    Review of Systems   Review of Systems  Physical Exam Updated Vital Signs BP 114/82 (BP Location: Left Arm)   Pulse 88   Temp 98.6 F (37 C)   Resp 20   Ht 5\' 9"  (1.753 m)   Wt 73.9 kg   SpO2 96%   BMI 24.07 kg/m  Physical Exam Vitals and nursing note reviewed.  Constitutional:      General: He is not in acute distress.    Appearance: He is well-developed.  HENT:     Head: Normocephalic and atraumatic.  Eyes:     General:        Right eye: No discharge.        Left eye: No discharge.     Conjunctiva/sclera: Conjunctivae normal.  Cardiovascular:     Rate and Rhythm: Normal rate and regular rhythm.     Heart sounds: Normal heart sounds.  Pulmonary:     Effort: Pulmonary effort is normal.     Breath sounds: Normal breath sounds. No wheezing, rhonchi or rales.     Comments: No wheezing Abdominal:     Palpations: Abdomen is soft.     Tenderness: There is abdominal tenderness. There is no guarding or rebound. Negative signs include Murphy's sign and McBurney's sign.     Comments: Generalized abdominal tenderness, nonfocal exam  Musculoskeletal:     Cervical back: Normal range of motion and neck supple.     Right lower leg: No edema.     Left lower leg: No edema.  Skin:    General: Skin is warm and dry.  Neurological:     Mental Status: He is alert.     ED Results / Procedures / Treatments   Labs (all labs ordered are listed, but only  abnormal results are displayed) Labs Reviewed  URINALYSIS, ROUTINE W REFLEX MICROSCOPIC - Abnormal; Notable for the following components:      Result Value   Protein, ur TRACE (*)    All other components within normal limits  CBC - Abnormal; Notable for the following components:   WBC 13.4 (*)    All other components within normal limits  LACTIC ACID, PLASMA - Abnormal; Notable for the following components:   Lactic Acid, Venous 2.1 (*)    All other components within normal limits  COMPREHENSIVE METABOLIC PANEL - Abnormal; Notable for the following components:   Glucose, Bld 151 (*)    AST 11 (*)    All other components within normal limits  CK  LACTIC ACID, PLASMA    EKG None  Radiology No results found.  Procedures Procedures    Medications Ordered in ED Medications  albuterol (VENTOLIN HFA) 108 (90 Base) MCG/ACT inhaler 2 puff (has no administration in time range)  metoCLOPramide (REGLAN) injection 10 mg (10 mg Intravenous Given 11/17/22 1718)  diphenhydrAMINE (BENADRYL) injection 25 mg (25 mg Intravenous Given 11/17/22 1718)  sodium chloride 0.9 % bolus 1,000 mL (1,000 mLs Intravenous New Bag/Given 11/17/22 1717)  HYDROcodone-acetaminophen (NORCO/VICODIN) 5-325 MG per tablet 1 tablet (1 tablet Oral Given 11/17/22 1714)    ED Course/ Medical Decision Making/ A&P    Patient seen and examined. History obtained directly from patient. Work-up including labs, imaging, EKG ordered in triage, if performed, were reviewed.    Labs/EKG: Independently reviewed and interpreted.  This included: CBC with elevated white blood cell count at 13.4 otherwise unremarkable with normal hemoglobin; CMP with normal electrolytes, glucose at 151 otherwise unremarkable; lactate minimally elevated at 2.1.  Awaiting UA.  Added CK.  Imaging: Chest x-ray, reviewed at bedside, read pending.  Medications/Fluids: Ordered: IV Reglan, IV Benadryl.  Patient is asking for medication for "full body  pain".  I will give him a dose of p.o. Vicodin.  Given elevated blood sugar and elevated lactate, will give IV fluid bolus with the medications for headache.  Most recent vital signs reviewed and are as follows: BP 114/82 (BP Location: Left Arm)   Pulse 88   Temp 98.6 F (37 C)   Resp 20   Ht 5\' 9"  (1.753 m)   Wt 73.9 kg   SpO2 96%   BMI 24.07 kg/m   Initial impression: Malaise with acute on chronic headache, abdominal pain.  General workup initiated.  7:11 PM Reassessment performed. Patient appears stable.  States that his headache is improved.  He is received IV fluids.  Labs personally reviewed and interpreted including:   Imaging personally visualized and interpreted including: Second lactate pending; UA without signs of infection; CK normal.  Reviewed pertinent lab work and imaging with patient at bedside. Questions answered.   Most current vital signs reviewed and are as follows: BP (!) 137/93   Pulse 75   Temp 98.6 F (37 C)   Resp 20   Ht 5\' 9"  (1.753 m)   Wt 73.9 kg   SpO2 92%   BMI 24.07 kg/m   Plan: Discharge to home after CXR result.  Patient requests albuterol inhaler, this has been ordered.  He states that he has albuterol solution at home for his nebulizer.  He will continue on prescribed tramadol for chronic pain.  Prescriptions written for: None  Other home care instructions discussed: Rest, hydration  ED return instructions discussed: Worsening shortness of breath, fevers, vomiting  Follow-up instructions discussed: Patient encouraged to follow-up with their PCP in 3 days.   7:16 PM Signout to [employer] at shift change.                                 Medical Decision Making Amount and/or Complexity of Data Reviewed Labs: ordered. Radiology:  ordered.  Risk Prescription drug management.   In regards to the patient's headache, critical differentials were considered including subarachnoid hemorrhage, intracerebral hemorrhage,  epidural/subdural hematoma, pituitary apoplexy, vertebral/carotid artery dissection, giant cell arteritis, central venous thrombosis, reversible cerebral vasoconstriction, acute angle closure glaucoma, idiopathic intracranial hypertension, bacterial meningitis, viral encephalitis, carbon monoxide poisoning, posterior reversible encephalopathy syndrome, pre-eclampsia.   Reg flag symptoms related to these causes were considered including systemic symptoms (fever, weight loss), neurologic symptoms (confusion, mental status change, vision change, associated seizure), acute or sudden "thunderclap" onset, patient age 48 or older with new or progressive headache, patient of any age with first headache or change in headache pattern, pregnant or postpartum status, history of HIV or other immunocompromise, history of cancer, headache occurring with exertion, associated neck or shoulder pain, associated traumatic injury, concurrent use of anticoagulation, family history of spontaneous SAH, and concurrent drug use.    Other benign, more common causes of headache were considered including migraine, tension-type headache, cluster headache, referred pain from other cause such as sinus infection, dental pain, trigeminal neuralgia.   On exam, patient has a reassuring neuro exam including baseline mental status, no significant neck pain or meningeal signs, no signs of severe infection or fever.   From a breathing standpoint, patient appears to be near baseline.  He is satting normally on 2 L.  No significant wheezing at time of exam.  He is already on prednisone chronically.  He has albuterol at home and will continue this.  Awaiting x-ray result, but no obvious sign of pneumonia.  The patient's vital signs, pertinent lab work and imaging were reviewed and interpreted as discussed in the ED course. Hospitalization was considered for further testing, treatments, or serial exams/observation. However as patient is  well-appearing, has a stable exam over the course of their evaluation, and reassuring studies today, I do not feel that they warrant admission at this time. This plan was discussed with the patient who verbalizes agreement and comfort with this plan and seems reliable and able to return to the Emergency Department with worsening or changing symptoms.         Final Clinical Impression(s) / ED Diagnoses Final diagnoses:  Acute nonintractable headache, unspecified headache type  Pulmonary emphysema, unspecified emphysema type (HCC)  Chronic low back pain, unspecified back pain laterality, unspecified whether sciatica present    Rx / DC Orders ED Discharge Orders     None         Renne Crigler, PA-C 11/17/22 1915    Renne Crigler, PA-C 11/17/22 1916    Benjiman Core, MD 11/17/22 [phone]

## 2022-11-17 NOTE — ED Notes (Signed)
Pt teaching provided on medications that may cause drowsiness. Pt instructed not to drive or operate heavy machinery while taking the prescribed medication. Pt verbalized understanding.   Pt provided discharge instructions and prescription information. Pt was given the opportunity to ask questions and questions were answered.

## 2022-11-17 NOTE — Telephone Encounter (Signed)
Received a call from pt c/o head pounding, body aches, back pain,diarrhea, and nose burning. Stated he thinks he might have pna again. No available appts - advised pt to go to UC or ER; stated he will "go somewhere".

## 2022-11-17 NOTE — ED Triage Notes (Signed)
Reports ongoing fatigue, abd pain, increased SOB, HX ca both lungs, 2LPM via [place] at all times. PCP has been treating symptoms with cough medicine. Increased urinary frequency. Multiple complaints in triage. In process of finding new doctor as well.

## 2022-11-17 NOTE — ED Provider Notes (Signed)
Patient given in sign out by Rhea Bleacher, PA-C.  Please review their note for patient HPI, physical exam, workup.  At this time the plan is follow up on CXR and plan on discharge.  Patient's chest x-ray does show new nodules and patient's repeat lactic acid is the same at 2.1.  I spoke to the patient about receiving more fluids for his slightly elevated lactic and patient states he does not want to wait for that as he states he can drink water at home.  I also offered a CT scan of the chest to further evaluate nodules however patient declined states he can follow-up outpatient for that.  Patient does have full decision-making capacity and understands the risk of being discharged with lactic but did not decrease the fluids and accepts these risks.  Patient will be discharged as per the original plan from the previous provider and encouraged to follow-up with his primary care provider in the next few days.  At time of discharge patient is stable and at baseline.  Patient verbalized understanding acceptance of this plan.   [name] 11/17/22 2022    Terry Core, MD 11/17/22 [phone]

## 2022-11-17 NOTE — Discharge Instructions (Addendum)
Please read and follow all provided instructions.  Your diagnoses today include:  1. Acute nonintractable headache, unspecified headache type   2. Pulmonary emphysema, unspecified emphysema type (HCC)   3. Chronic low back pain, unspecified back pain laterality, unspecified whether sciatica present     Tests performed today include: Complete blood cell count: White blood cell count was slightly high, normal hemoglobin, no other concerning findings Complete metabolic panel: Blood sugar was a little high otherwise no concerning findings Creatine kinase test for body aches: Was normal Lactic acid test: Was slightly elevated Chest x-ray Vital signs. See below for your results today.   Medications:  In the Emergency Department you received: Reglan - antinausea/headache medication Benadryl - antihistamine to counteract potential side effects of reglan  Take any prescribed medications only as directed.  Additional information:  Follow any educational materials contained in this packet.  You are having a headache. No specific cause was found today for your headache. It may have been a migraine or other cause of headache. Stress, anxiety, fatigue, and depression are common triggers for headaches.   Your headache today does not appear to be life-threatening or require hospitalization, but often the exact cause of headaches is not determined in the emergency department. Therefore, follow-up with your doctor is very important to find out what may have caused your headache and whether or not you need any further diagnostic testing or treatment.   Sometimes headaches can appear benign (not harmful), but then more serious symptoms can develop which should prompt an immediate re-evaluation by your doctor or the emergency department.  BE VERY CAREFUL not to take multiple medicines containing Tylenol (also called acetaminophen). Doing so can lead to an overdose which can damage your liver and cause  liver failure and possibly death.   Follow-up instructions: Please follow-up with your primary care provider in the next 3 days for further evaluation of your symptoms.   Return instructions:  Please return to the Emergency Department if you experience worsening symptoms. Return if the medications do not resolve your headache, if it recurs, or if you have multiple episodes of vomiting or cannot keep down fluids. Return if you have a change from the usual headache. RETURN IMMEDIATELY IF you: Develop a sudden, severe headache Develop confusion or become poorly responsive or faint Develop a fever above 100.71F or problem breathing Have a change in speech, vision, swallowing, or understanding Develop new weakness, numbness, tingling, incoordination in your arms or legs Have a seizure Please return if you have any other emergent concerns.  Additional Information:  Your vital signs today were: BP (!) 137/93   Pulse 75   Temp 98.6 F (37 C)   Resp 20   Ht 5\' 9"  (1.753 m)   Wt 73.9 kg   SpO2 92%   BMI 24.07 kg/m  If your blood pressure (BP) was elevated above 135/85 this visit, please have this repeated by your doctor within one month. --------------  Today your chest x-ray shows nodules that we will need to have a CT scan done to further assess.  Also your lactic acid was slightly elevated and after receiving fluids did not move.  At your request you were discharged however I strongly recommend that you drink plenty of fluids to help bring your lactic down.  If symptoms change or worsen in any way shape or form please return to ER.

## 2022-11-19 ENCOUNTER — Other Ambulatory Visit: Payer: Self-pay | Admitting: Emergency Medicine

## 2022-11-19 ENCOUNTER — Other Ambulatory Visit: Payer: Self-pay | Admitting: Gastroenterology

## 2022-11-20 DIAGNOSIS — R350 Frequency of micturition: Secondary | ICD-10-CM | POA: Diagnosis not present

## 2022-11-21 ENCOUNTER — Other Ambulatory Visit: Payer: Self-pay | Admitting: Emergency Medicine

## 2022-11-21 NOTE — Telephone Encounter (Signed)
Dr.Mansouraty I called Mr [name] to see if this was something that he had requested refill on or automatic refill from pharmacy. Patient states that he is having diarrhea again and requested refill.  Xifaxan helped in the past. Okay to refill Xifaxan?

## 2022-11-25 ENCOUNTER — Ambulatory Visit (INDEPENDENT_AMBULATORY_CARE_PROVIDER_SITE_OTHER): Payer: 59 | Admitting: Internal Medicine

## 2022-11-25 ENCOUNTER — Telehealth: Payer: Self-pay | Admitting: *Deleted

## 2022-11-25 ENCOUNTER — Ambulatory Visit: Payer: 59 | Admitting: Adult Health

## 2022-11-25 VITALS — BP 130/83 | HR 69 | Temp 97.7°F | Ht 69.0 in | Wt 172.0 lb

## 2022-11-25 DIAGNOSIS — G44221 Chronic tension-type headache, intractable: Secondary | ICD-10-CM | POA: Diagnosis not present

## 2022-11-25 DIAGNOSIS — G44201 Tension-type headache, unspecified, intractable: Secondary | ICD-10-CM | POA: Diagnosis not present

## 2022-11-25 DIAGNOSIS — J439 Emphysema, unspecified: Secondary | ICD-10-CM | POA: Diagnosis not present

## 2022-11-25 DIAGNOSIS — J441 Chronic obstructive pulmonary disease with (acute) exacerbation: Secondary | ICD-10-CM | POA: Diagnosis not present

## 2022-11-25 DIAGNOSIS — R911 Solitary pulmonary nodule: Secondary | ICD-10-CM | POA: Diagnosis not present

## 2022-11-25 NOTE — Assessment & Plan Note (Signed)
Lung nodule noted on recent CXR, with recommendation for CT follow up.  -Low dose chest CT w/o contrast ordered
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
## 2022-11-25 NOTE — Assessment & Plan Note (Signed)
Patient reports difficulty getting around his house, cleaning, and taking his medications due to his dyspnea and overall deconditioning. He is requesting assistance with these activities.  -referral to [facility] care management placed

## 2022-11-25 NOTE — Assessment & Plan Note (Addendum)
Patient reports ongoing headache for the last approximately four weeks. He reports pounding bilateral headache of his temples and behind his eyes. Occasional bilateral eye watering, some blurry vision. BC powder, Tylenol, and tramadol 50mg  he takes for chronic back pain have not been effective in relieving his pain. Hx is significant for RCC s/p surgery and lung cancer s/p cancer. No focal deficits noted on neuro exam. Differential includes medication overuse headache, brain malignancy, temporal arteritis.  -ESR, CRP today -MRI brain ordered -Neurology appointment already scheduled Nov 5

## 2022-11-25 NOTE — Progress Notes (Signed)
Care Coordination Note  11/25/2022 Name: [name] MRN: [id number] DOB: May 28, 1955  [name] is a 67 y.o. year old male who is a primary care patient of Monna Fam, MD. [facility] Internal medicine reached out by phone today to assist with scheduling an initial visit with the RN Case Manager  Follow up plan: Telephone appointment with care management team member scheduled for:11/26/22  [facility] Coordination Care Guide  Direct Dial: [phone]

## 2022-11-25 NOTE — Patient Instructions (Signed)
[name],   It was a pleasure meeting you today.   For your headache, I have ordered an MRI of your brain. The scheduler will be contacting you soon to arrange this. Please return to the ED if your symptoms worsen.   I have also placed a referral to have a CT of your chest done for the nodule found on the chest xray.   Please keep your upcoming neurology appointment.   We will plan to see you again in approximately 6 weeks.   Thanks,  Dr Carlynn Purl

## 2022-11-26 ENCOUNTER — Ambulatory Visit: Payer: Self-pay

## 2022-11-26 DIAGNOSIS — M4712 Other spondylosis with myelopathy, cervical region: Secondary | ICD-10-CM

## 2022-11-26 DIAGNOSIS — Z789 Other specified health status: Secondary | ICD-10-CM

## 2022-11-26 DIAGNOSIS — J439 Emphysema, unspecified: Secondary | ICD-10-CM

## 2022-11-26 LAB — C-REACTIVE PROTEIN: CRP: 45 mg/L — ABNORMAL HIGH (ref 0–10)

## 2022-11-26 LAB — SEDIMENTATION RATE: Sed Rate: 7 mm/h (ref 0–30)

## 2022-11-26 NOTE — Patient Instructions (Signed)
Visit Information  Thank you for taking time to visit with me today. Please don't hesitate to contact me if I can be of assistance to you.   Following are the goals we discussed today:   Goals Addressed             This Visit's Progress    Maintain, Monitor and Self-Manage Symptoms of COPD       Patient Goals/Self Care Activities: -Patient/Caregiver will take medications as prescribed   -Patient/Caregiver will attend all scheduled provider appointments -Patient/Caregiver will call pharmacy for medication refills 3-7 days in advance of running out of medications -Patient/Caregiver will call provider office for new concerns or questions  -Patient/Caregiver will focus on medication adherence by taking medications as prescribed   Provided patient with basic  verbal COPD education on self care/management/and exacerbation prevention Advised patient to track and manage COPD triggers Advised patient to self assesses COPD action plan zone and make appointment with provider if in the yellow zone for 48 hours without improvement Discussed the importance of adequate rest and management of fatigue with COPD         Our next appointment is by telephone on 12/11/22 at 130 pm  Please call the care guide team at [phone] if you need to cancel or reschedule your appointment.   If you are experiencing a Mental Health or Behavioral Health Crisis or need someone to talk to, please call 1-800-273-TALK (toll free, 24 hour hotline)  Patient verbalizes understanding of instructions and care plan provided today and agrees to view in MyChart. Active MyChart status and patient understanding of how to access instructions and care plan via MyChart confirmed with patient.     Juanell Fairly RN, BSN, [facility] [employer]er Phone: [phone]

## 2022-11-26 NOTE — Addendum Note (Signed)
Addended by: Monna Fam on: 11/26/2022 09:03 AM   Modules accepted: Level of Service

## 2022-11-26 NOTE — Patient Outreach (Signed)
Care Coordination   Initial Visit Note   11/26/2022 Name: [name] MRN: [id number] DOB: [birth year]  [name] is a 67 y.o. year old male who sees Monna Fam, MD for primary care. I spoke with  [name] by phone today.  What matters to the patients health and wellness today?  I spoke with [name] today regarding his COPD. He had a friend, [name], with him, who listened to our discussion with his permission. I informed him that if he wanted her to receive information discussed, he would need to sign a consent form. I will check if the office can mail him a form to fill out and bring to his next visit, or he can deliver it in person. Mr. Garavito is currently using two liters of oxygen and should be getting a humidifier to help with nasal dryness. We discussed his COPD action plan, and I will send him additional information through his MyChart account. We also talked about the importance of having a medical alert bracelet. Furthermore, we reviewed his medications, and he confirmed that he is taking them as prescribed.    Goals Addressed             This Visit's Progress    Maintain, Monitor and Self-Manage Symptoms of COPD       Patient Goals/Self Care Activities: -Patient/Caregiver will take medications as prescribed   -Patient/Caregiver will attend all scheduled provider appointments -Patient/Caregiver will call pharmacy for medication refills 3-7 days in advance of running out of medications -Patient/Caregiver will call provider office for new concerns or questions  -Patient/Caregiver will focus on medication adherence by taking medications as prescribed   Provided patient with basic  verbal COPD education on self care/management/and exacerbation prevention Advised patient to track and manage COPD triggers Advised patient to self assesses COPD action plan zone and make appointment with provider if in the yellow zone for 48 hours without improvement Discussed  the importance of adequate rest and management of fatigue with COPD         SDOH assessments and interventions completed:  Yes  SDOH Interventions Today    Flowsheet Row Most Recent Value  SDOH Interventions   Food Insecurity Interventions Intervention Not Indicated  Transportation Interventions Intervention Not Indicated        Care Coordination Interventions:  Yes, provided   Interventions Today    Flowsheet Row Most Recent Value  Chronic Disease   Chronic disease during today's visit Chronic Obstructive Pulmonary Disease (COPD)  General Interventions   General Interventions Discussed/Reviewed General Interventions Discussed, General Interventions Reviewed, Durable Medical Equipment (DME)  Durable Medical Equipment (DME) Oxygen, Dan Humphreys, Wheelchair  Pharmacy Interventions   Pharmacy Dicussed/Reviewed Pharmacy Topics Discussed  Safety Interventions   Safety Discussed/Reviewed Safety Discussed        Follow up plan: Follow up call scheduled for 12/11/22  130 pm    Encounter Outcome:  Patient Visit Completed   Juanell Fairly RN, BSN, [facility]   Care Coordinator Phone: [phone]

## 2022-11-26 NOTE — Progress Notes (Signed)
Internal Medicine Clinic Attending  I was physically present during the key portions of the resident provided service and participated in the medical decision making of patient's management care. I reviewed pertinent patient test results.  The assessment, diagnosis, and plan were formulated together and I agree with the documentation in the resident's note.  Chronically ill patient with COPD on supplemental oxygen and a personal history of RCC and lung cancer previously treated with surgery, here with headache. Chart review shows this has been an intermittent issue for many years. The description of the discomfort is not migranous, sounds like tension type headache. He has tenderness at the bilateral temples, but ESR is normal again, as it has been in previous years, so I think the risk of GCA as the cause is low. Given his personal history of cancer, I would like to get an MRI brain to rule out intracranial mass as the culprit, this has not been done in the past workups. The patient also has numerous negative social determinants of health, he has applied for [facility] services through [facility]. He has polypharmacy and little support at home. If workup is reassuring, I think these are probably multifactorial tension headaches that are chronic-intermittent and will not have an easy treatment. Occasional NSAIDs are fine, we recommend against every day use. We will follow up with him closely, also working on palliative referral, counseling with Marena Chancy, and care management with [facility].   Erlinda Hong, MD FACP

## 2022-11-27 ENCOUNTER — Telehealth: Payer: Self-pay | Admitting: *Deleted

## 2022-11-27 DIAGNOSIS — R3915 Urgency of urination: Secondary | ICD-10-CM | POA: Diagnosis not present

## 2022-11-27 DIAGNOSIS — R350 Frequency of micturition: Secondary | ICD-10-CM | POA: Diagnosis not present

## 2022-11-27 NOTE — Progress Notes (Signed)
Care Coordination Note  11/27/2022 Name: [name] MRN: [id number] DOB: 10/13/1955  [name] is a 67 y.o. year old male who is a primary care patient of Monna Fam, MD and is actively engaged with the care management team. I reached out to [name] by phone today to assist with scheduling an initial visit with the BSW  Follow up plan: Patient declined for engagement with BSW for resources for a life alert. Appropriate care team members and provider have been notified via electronic communication.   [facility]  Care Coordination Care Guide  Direct Dial: [phone]

## 2022-11-28 NOTE — Addendum Note (Signed)
Addended by: Monna Fam on: 11/28/2022 08:52 AM   Modules accepted: Level of Service

## 2022-12-01 ENCOUNTER — Telehealth: Payer: Self-pay | Admitting: *Deleted

## 2022-12-01 NOTE — Telephone Encounter (Signed)
Spoke with Karel Jarvis at [facility] regarding PCS form for patient, informed her form faxed([phone] / fax [phone]).  Form also faxed to [facility]ie [facility] / [employer] for review - scheduling. (Ph [phone] Valinda Hoar [phone] .) office will contact patient regarding assessment appointment.

## 2022-12-03 ENCOUNTER — Ambulatory Visit: Payer: 59 | Admitting: Licensed Clinical Social Worker

## 2022-12-04 ENCOUNTER — Other Ambulatory Visit: Payer: Self-pay | Admitting: Gastroenterology

## 2022-12-04 DIAGNOSIS — R3915 Urgency of urination: Secondary | ICD-10-CM | POA: Diagnosis not present

## 2022-12-04 DIAGNOSIS — R350 Frequency of micturition: Secondary | ICD-10-CM | POA: Diagnosis not present

## 2022-12-04 NOTE — Progress Notes (Signed)
AWV reviewed and will be routed to the PCP/team for f/u of any pending concerns.

## 2022-12-08 ENCOUNTER — Ambulatory Visit (INDEPENDENT_AMBULATORY_CARE_PROVIDER_SITE_OTHER): Payer: 59 | Admitting: Licensed Clinical Social Worker

## 2022-12-08 DIAGNOSIS — F419 Anxiety disorder, unspecified: Secondary | ICD-10-CM

## 2022-12-08 NOTE — BH Specialist Note (Signed)
Integrated Behavioral Health via Telemedicine Visit  12/08/2022 KASON BENAK [id number]  Number of Integrated Behavioral Health Clinician visits: Additional Visit  Session Start time: 1330   Session End time: 1421  Total time in minutes: 51   Referring Provider: PCP Patient/Family location: Home [facility] Provider location: Office All persons participating in visit: [facility] and Patient Types of Service: Individual psychotherapy  I connected with [name] a via  Telephone  and verified that I am speaking with the correct person using two identifiers. Discussed confidentiality: Yes   I discussed the limitations of telemedicine and the availability of in person appointments.  Discussed there is a possibility of technology failure and discussed alternative modes of communication if that failure occurs.  I discussed that engaging in this telemedicine visit, they consent to the provision of behavioral healthcare and the services will be billed under their insurance.  Patient and/or legal guardian expressed understanding and consented to Telemedicine visit: Yes   Presenting Concerns: Patient and/or family reports the following symptoms/concerns: The patient recently experienced a choking episode, which warrants close monitoring for any persistent symptoms such as coughing, difficulty swallowing, or breathing issues. The client is currently working with a doctor weekly to address bowel concerns. The patient's support system passed away two years ago, but patients have two sisters living in [place] who may provide some assistance. The patient is exploring various support services, including [facility] transportation and [employer] in [place], [place]. [facility] has completed a SCAT application and Client reports a PCS application has been submitted.  Patient and/or Family's Strengths/Protective Factors: Social connections  Goals Addressed: Patient will:  Reduce symptoms of: anxiety  and depression   Increase knowledge and/or ability of: coping skills   Demonstrate ability to: Increase healthy adjustment to current life circumstances  Progress towards Goals: Ongoing  Interventions: Interventions utilized:  CBT Cognitive Behavioral Therapy Standardized Assessments completed: Not Needed  Patient and/or Family Response: Patient agrees to continued therapy  Assessment: Patient currently experiencing Depression related to medical illnesses.   Patient may benefit from Ongoing therapy .  Plan: Follow up with behavioral health clinician on : 11/20 in person at 2:30pm   I discussed the assessment and treatment plan with the patient and/or parent/guardian. They were provided an opportunity to ask questions and all were answered. They agreed with the plan and demonstrated an understanding of the instructions.   They were advised to call back or seek an in-person evaluation if the symptoms worsen or if the condition fails to improve as anticipated. Christen Butter, MSW, LCSW-A She/Her Behavioral Health Clinician [facility]  Internal Medicine Center Direct Dial:[phone]  Fax [phone] Main Office Phone: [phone] [address] [place] [place], [place] [address] Website: [facility] Internal Medicine [facility]  [place], [place]  [place]

## 2022-12-09 DIAGNOSIS — Z79899 Other long term (current) drug therapy: Secondary | ICD-10-CM | POA: Diagnosis not present

## 2022-12-09 DIAGNOSIS — G250 Essential tremor: Secondary | ICD-10-CM | POA: Diagnosis not present

## 2022-12-09 DIAGNOSIS — G43009 Migraine without aura, not intractable, without status migrainosus: Secondary | ICD-10-CM | POA: Diagnosis not present

## 2022-12-11 ENCOUNTER — Ambulatory Visit: Payer: Self-pay

## 2022-12-11 NOTE — Patient Outreach (Signed)
  Care Coordination   Follow Up Visit Note   12/11/2022 Name: [name] MRN: [id number] DOB: Oct 08, 1955  [name] is a 67 y.o. year old male who sees Terry Fam, MD for primary care. I spoke with  [name] by phone today.  What matters to the patients health and wellness today?  I communicated with [name], who reported that he is experiencing an acceptable level of health. His respiratory function remains stable, and he uses 2 liters of oxygen. He noted that he becomes short of breath with physical exertion, necessitating that he paces himself while performing activities at home. At present, he is exploring options for assistance and is considering using a medication delivery service. I recommended that he contact his insurance provider to verify the availability of such a service and their preference. Furthermore, I reviewed the Chronic Obstructive Pulmonary Disease (COPD) action plan with him, emphasizing the importance of maintaining hydration to alleviate potential phlegm. I also advised him to avoid irritants that could exacerbate his respiratory condition, including smoke and certain cleaning products.    Goals Addressed             This Visit's Progress    Maintain, Monitor and Self-Manage Symptoms of COPD       Patient Goals/Self Care Activities: -Patient/Caregiver will take medications as prescribed   -Patient/Caregiver will attend all scheduled provider appointments -Patient/Caregiver will call pharmacy for medication refills 3-7 days in advance of running out of medications -Patient/Caregiver will call provider office for new concerns or questions  -Patient/Caregiver will focus on medication adherence by taking medications as prescribed   Advised patient to track and manage COPD triggers Advised patient to self assesses COPD action plan zone and make appointment with provider if in the yellow zone for 48 hours without improvement Discussed the importance  of adequate rest and management of fatigue with COPD Avoid irritants that could exacerbate your condition Drink fluids         SDOH assessments and interventions completed:  No     Care Coordination Interventions:  Yes, provided   Follow up plan: Follow up call scheduled for 01/11/23  11 am    Encounter Outcome:  Patient Visit Completed   Juanell Fairly RN, BSN, [facility]  [facility], [facility]  Care Coordinator Phone: [phone]

## 2022-12-11 NOTE — Patient Instructions (Signed)
Visit Information  Thank you for taking time to visit with me today. Please don't hesitate to contact me if I can be of assistance to you.   Following are the goals we discussed today:   Goals Addressed             This Visit's Progress    Maintain, Monitor and Self-Manage Symptoms of COPD       Patient Goals/Self Care Activities: -Patient/Caregiver will take medications as prescribed   -Patient/Caregiver will attend all scheduled provider appointments -Patient/Caregiver will call pharmacy for medication refills 3-7 days in advance of running out of medications -Patient/Caregiver will call provider office for new concerns or questions  -Patient/Caregiver will focus on medication adherence by taking medications as prescribed   Advised patient to track and manage COPD triggers Advised patient to self assesses COPD action plan zone and make appointment with provider if in the yellow zone for 48 hours without improvement Discussed the importance of adequate rest and management of fatigue with COPD Avoid irritants that could exacerbate your condition Drink fluids         Our next appointment is by telephone on 01/11/23 at 11 am  Please call the care guide team at [phone] if you need to cancel or reschedule your appointment.   If you are experiencing a Mental Health or Behavioral Health Crisis or need someone to talk to, please call 1-800-273-TALK (toll free, 24 hour hotline)  Patient verbalizes understanding of instructions and care plan provided today and agrees to view in MyChart. Active MyChart status and patient understanding of how to access instructions and care plan via MyChart confirmed with patient.     Juanell Fairly RN, BSN, [facility]  [facility], [facility]  Care Coordinator Phone: [phone]

## 2022-12-15 ENCOUNTER — Telehealth: Payer: Self-pay | Admitting: Emergency Medicine

## 2022-12-15 NOTE — Telephone Encounter (Signed)
PT presented to the front desk. Please see last signed encounter. PT states 3 antibx should have been prescribed to him. I only see 2 on med list. Missing is Cefuroxime maybe.

## 2022-12-15 NOTE — Telephone Encounter (Signed)
Got antibiotics mixed up with his other medications and is not sure which antibiotic he should be taking now.  At his last visit with Dr. Delton Coombes on 09/24/2022, he was prescribed Doxycycline to take the first week of month 1 (September), Cefuroxime first week of months 2 (October) and Azithromycin first week of month 2 (November).  He will then repeat the Doxycycline in December.  I provided the above instructions to the patient.  He verbalized understanding.  He was also given a copy of the AVS with this schedule highlighted by the front staff when he came into the office.  Nothing further needed.

## 2022-12-16 ENCOUNTER — Telehealth: Payer: Self-pay | Admitting: *Deleted

## 2022-12-16 NOTE — Telephone Encounter (Signed)
Spoke with patient regarding his PCS request/ patient stated he had his assessment November 7.2024/ patient is to give me a call back [phone] )as which agency he will use for PCS.

## 2022-12-17 DIAGNOSIS — G43009 Migraine without aura, not intractable, without status migrainosus: Secondary | ICD-10-CM | POA: Diagnosis not present

## 2022-12-17 DIAGNOSIS — M47812 Spondylosis without myelopathy or radiculopathy, cervical region: Secondary | ICD-10-CM | POA: Diagnosis not present

## 2022-12-17 DIAGNOSIS — Z981 Arthrodesis status: Secondary | ICD-10-CM | POA: Diagnosis not present

## 2022-12-17 DIAGNOSIS — E236 Other disorders of pituitary gland: Secondary | ICD-10-CM | POA: Diagnosis not present

## 2022-12-17 DIAGNOSIS — I6782 Cerebral ischemia: Secondary | ICD-10-CM | POA: Diagnosis not present

## 2022-12-18 DIAGNOSIS — R350 Frequency of micturition: Secondary | ICD-10-CM | POA: Diagnosis not present

## 2022-12-18 DIAGNOSIS — R3915 Urgency of urination: Secondary | ICD-10-CM | POA: Diagnosis not present

## 2022-12-18 DIAGNOSIS — C642 Malignant neoplasm of left kidney, except renal pelvis: Secondary | ICD-10-CM | POA: Diagnosis not present

## 2022-12-22 ENCOUNTER — Other Ambulatory Visit: Payer: Self-pay | Admitting: Pulmonary Disease

## 2022-12-22 DIAGNOSIS — R1900 Intra-abdominal and pelvic swelling, mass and lump, unspecified site: Secondary | ICD-10-CM | POA: Diagnosis not present

## 2022-12-22 DIAGNOSIS — D4102 Neoplasm of uncertain behavior of left kidney: Secondary | ICD-10-CM | POA: Diagnosis not present

## 2022-12-22 DIAGNOSIS — Z905 Acquired absence of kidney: Secondary | ICD-10-CM | POA: Diagnosis not present

## 2022-12-22 DIAGNOSIS — J432 Centrilobular emphysema: Secondary | ICD-10-CM

## 2022-12-22 DIAGNOSIS — R918 Other nonspecific abnormal finding of lung field: Secondary | ICD-10-CM | POA: Diagnosis not present

## 2022-12-24 ENCOUNTER — Ambulatory Visit: Payer: 59 | Admitting: Licensed Clinical Social Worker

## 2022-12-24 ENCOUNTER — Ambulatory Visit (INDEPENDENT_AMBULATORY_CARE_PROVIDER_SITE_OTHER): Payer: 59 | Admitting: Internal Medicine

## 2022-12-24 ENCOUNTER — Encounter: Payer: Self-pay | Admitting: Student

## 2022-12-24 VITALS — BP 118/83 | HR 62 | Temp 97.7°F | Ht 69.0 in | Wt 166.1 lb

## 2022-12-24 DIAGNOSIS — J069 Acute upper respiratory infection, unspecified: Secondary | ICD-10-CM | POA: Diagnosis not present

## 2022-12-24 DIAGNOSIS — M791 Myalgia, unspecified site: Secondary | ICD-10-CM

## 2022-12-24 DIAGNOSIS — R519 Headache, unspecified: Secondary | ICD-10-CM | POA: Diagnosis not present

## 2022-12-24 DIAGNOSIS — G8929 Other chronic pain: Secondary | ICD-10-CM

## 2022-12-24 DIAGNOSIS — F419 Anxiety disorder, unspecified: Secondary | ICD-10-CM

## 2022-12-24 MED ORDER — ZONISAMIDE 25 MG PO CAPS: 25.0000 mg | ORAL_CAPSULE | Freq: Every day | ORAL | 1 refills | Status: DC

## 2022-12-24 MED ORDER — IBUPROFEN 200 MG PO TABS
400.0000 mg | ORAL_TABLET | Freq: Once | ORAL | Status: AC
Start: 2022-12-24 — End: 2022-12-24
  Administered 2022-12-24: 400 mg via ORAL

## 2022-12-24 MED ORDER — PROMETHAZINE HCL 25 MG PO TABS
25.0000 mg | ORAL_TABLET | Freq: Once | ORAL | Status: AC
Start: 2022-12-24 — End: 2022-12-24
  Administered 2022-12-24: 25 mg via ORAL

## 2022-12-24 NOTE — BH Specialist Note (Signed)
Integrated Behavioral Health Follow Up In-Person Visit  MRN: [id number] Name: [name]  Number of Integrated Behavioral Health Clinician visits: Additional Visit  Session Start time: 1430   Session End time: 1524  Total time in minutes: 54   Types of Service: Individual psychotherapy  I  Subjective: [name] is a 67 y.o. male   Patient reports the following symptoms/concerns: Patient presented for a follow-up visit with LCSW-A, BHC, Christen Butter. During the session, the patient appeared to be in significant pain and discomfort. The patient reported a pain level of 8 on a scale from no pain to worst pain imaginable, describing generalized aching throughout the body, including headache, shoulders, neck, back, stomach area, legs, and feet. Additionally, the patient complained of itching in the eyes and severe fatigue with low energy. The pain was described as severe, interfering with normal daily activities. Given the severity of symptoms, the [facility] sent a message to Nurse Triage, who was able to schedule the patient with Dr. Mikey Bussing for further evaluation. During the session, the [facility] and patient discussed the stages of change model, addressing the challenging process of accepting that the patient's health status may never return to its previous state. The [facility] and patient also explored feelings of incompetence related to the pain, considered palliative care options, and discussed the impact on Activities of Daily Living (ADLs). To ensure comprehensive care, the [facility] sent messages to [facility] RNs D. Scotty Court and Reyes Ivan for additional support. The patient has been scheduled for an in-person appointment on 12/24 at 2:30 pm for continued care and monitoring.   Risk of harm to self or others: No plan to harm self or others    Patient and/or Family's Strengths/Protective Factors: Social connections  Goals Addressed: Patient will:  Reduce symptoms of: anxiety and depression   Increase  knowledge and/or ability of: coping skills   Demonstrate ability to: Increase healthy adjustment to current life circumstances  Progress towards Goals: Ongoing  Interventions: Interventions utilized:  CBT Cognitive Behavioral Therapy Standardized Assessments completed: Not Needed  Patient and/or Family Response: Patient agrees to continued OPT   Assessment: Patient currently experiencing Depression related to health.   Patient may benefit from individual therapy.  Plan: Follow up with behavioral health clinician on : 01/26/2023 at 2:30 Pm Christen Butter, MSW, LCSW-A She/Her Behavioral Health Clinician [facility]  Internal Medicine Center Direct Dial:[phone]  Fax [phone] Main Office Phone: [phone] [address] [place] [place], [place] [address] Website: [facility] Internal Medicine [facility]  [place], [place]  [place]

## 2022-12-24 NOTE — Assessment & Plan Note (Signed)
[facility]. Started on zonegran 50mg  at bedtime,  which is helping decrease frequency however feels dose is too strong and leaves him "like a zombie" the next day.  Doesn't want to keep taking it.  We discussed trial of decreasing down to 25mg  at bedtime.  Has current HA and some nausea,  Given 400mg  Ibuprofen and 25mg  phennergan in office.

## 2022-12-24 NOTE — Patient Instructions (Signed)
I will the call with the results of the tests.  Please let me know if you feel worse.

## 2022-12-24 NOTE — Assessment & Plan Note (Signed)
Again experiencing some generalized myalgias and a sore throat started this morning.  Similar to episode that brought him to the emergency department last month.  He is afebrile today oxygen saturation is good.  Lungs are clear to auscultation.  Posterior oropharynx only minor erythema.  Discussed with him testing for COVID and Tamiflu and potential treatment given high comorbidity burden if positive and he is agreeable to this otherwise advised rest and notification if he is worsening at all.   COVID and influenza testing.

## 2022-12-24 NOTE — Progress Notes (Signed)
Established Patient Office Visit  Subjective   Patient ID: [name], male    DOB: [birth year]  Age: 67 y.o. MRN: [id number]  Chief Complaint  Patient presents with   Body aching all over   This is a 67 year old male with past medical history of COPD and chronic hypoxic respiratory failure on supplemental O2.  He was seen today by behavioral health and requested to have a physician evaluate for some generalized myalgias which started this morning. last happened a few months ago went to [facility] ED found lung nodules, rx pain medication. Subjective fevers, chills, all similar to previous. Lives by self, no sick contacts.       Objective:     BP 118/83 (BP Location: Right Arm, Patient Position: Sitting, Cuff Size: Large)   Pulse 62   Temp 97.7 F (36.5 C) (Oral)   Ht 5\' 9"  (1.753 m)   Wt 166 lb 1.6 oz (75.3 kg)   SpO2 97% Comment: 2 LITER  BMI 24.53 kg/m  BP Readings from Last 3 Encounters:  12/24/22 118/83  11/25/22 130/83  11/17/22 (!) 131/48   Wt Readings from Last 3 Encounters:  12/24/22 166 lb 1.6 oz (75.3 kg)  11/25/22 172 lb (78 kg)  11/17/22 163 lb (73.9 kg)      Physical Exam Vitals and nursing note reviewed.  Constitutional:      Appearance: Normal appearance.  HENT:     Mouth/Throat:     Mouth: Mucous membranes are moist.     Pharynx: Posterior oropharyngeal erythema (mild posterior oropharynx) present.  Neck:     Comments: Minor enlarged LN left anterior cervical chain Pulmonary:     Effort: Pulmonary effort is normal. No respiratory distress.     Breath sounds: Normal breath sounds. No wheezing, rhonchi or rales.     Comments: On supplemental o2 via [facility]:        Mood and Affect: Mood normal.        Behavior: Behavior normal.      No results found for any visits on 12/24/22.  Last CBC Lab Results  Component Value Date   WBC 13.4 (H) 11/17/2022   HGB 13.9 11/17/2022   HCT 42.1 11/17/2022   MCV 93.8 11/17/2022   MCH  31.0 11/17/2022   RDW 15.2 11/17/2022   PLT 300 11/17/2022   Last metabolic panel Lab Results  Component Value Date   GLUCOSE 151 (H) 11/17/2022   NA 136 11/17/2022   K 4.1 11/17/2022   CL 102 11/17/2022   CO2 25 11/17/2022   BUN 13 11/17/2022   CREATININE 1.04 11/17/2022   GFRNONAA >60 11/17/2022   CALCIUM 9.3 11/17/2022   PHOS 3.3 02/05/2022   PROT 6.6 11/17/2022   ALBUMIN 3.9 11/17/2022   LABGLOB 2.4 04/30/2021   AGRATIO 1.8 04/30/2021   BILITOT 0.6 11/17/2022   ALKPHOS 74 11/17/2022   AST 11 (L) 11/17/2022   ALT 12 11/17/2022   ANIONGAP 9 11/17/2022   Last lipids Lab Results  Component Value Date   CHOL 198 09/17/2022   HDL 80 09/17/2022   LDLCALC 93 09/17/2022   TRIG 145 09/17/2022   CHOLHDL 2.5 09/17/2022      The 10-year ASCVD risk score (Arnett DK, et al., 2019) is: 13%    Assessment & Plan:   Problem List Items Addressed This Visit       Other   Myalgia    Again experiencing some generalized myalgias and a sore throat started this  morning.  Similar to episode that brought him to the emergency department last month.  He is afebrile today oxygen saturation is good.  Lungs are clear to auscultation.  Posterior oropharynx only minor erythema.  Discussed with him testing for COVID and Tamiflu and potential treatment given high comorbidity burden if positive and he is agreeable to this otherwise advised rest and notification if he is worsening at all.   COVID and influenza testing.      Relevant Orders   Coronavirus (COVID-19) with Influenza A and Influenza B   Chronic headaches - Primary    [facility]. Started on zonegran 50mg  at bedtime,  which is helping decrease frequency however feels dose is too strong and leaves him "like a zombie" the next day.  Doesn't want to keep taking it.  We discussed trial of decreasing down to 25mg  at bedtime.  Has current HA and some nausea,  Given 400mg  Ibuprofen and 25mg  phennergan in office.       Relevant Medications   zonisamide (ZONEGRAN) 25 MG capsule    Return if symptoms worsen or fail to improve.    Gust Rung, DO

## 2022-12-25 ENCOUNTER — Ambulatory Visit: Payer: Self-pay

## 2022-12-25 DIAGNOSIS — C642 Malignant neoplasm of left kidney, except renal pelvis: Secondary | ICD-10-CM | POA: Diagnosis not present

## 2022-12-25 DIAGNOSIS — R350 Frequency of micturition: Secondary | ICD-10-CM | POA: Diagnosis not present

## 2022-12-25 DIAGNOSIS — C7802 Secondary malignant neoplasm of left lung: Secondary | ICD-10-CM | POA: Diagnosis not present

## 2022-12-25 DIAGNOSIS — C7801 Secondary malignant neoplasm of right lung: Secondary | ICD-10-CM | POA: Diagnosis not present

## 2022-12-25 NOTE — Patient Outreach (Signed)
  Care Coordination   Follow Up Visit Note   12/25/2022 Name: CURTIES SASSAMAN MRN: [id number] DOB: [birth year]  [name] is a 67 y.o. year old male who sees Monna Fam, MD for primary care. I spoke with  [name] by phone today.  What matters to the patients health and wellness today?  Mr. Ake requested a conversationwith me about palliative care, its purpose, and how it could benefit him. We discussed what palliative care involves, and I informed him that I would send a message to his primary care physician to request a referral for palliative care. He agreed.     SDOH assessments and interventions completed:  No     Care Coordination Interventions:  Yes, provided   Interventions Today    Flowsheet Row Most Recent Value  Chronic Disease   Chronic disease during today's visit Chronic Obstructive Pulmonary Disease (COPD)  General Interventions   General Interventions Discussed/Reviewed General Interventions Discussed  [Paliative care]  Education Interventions   Education Provided Provided Education        Follow Up Plan: RNCM will follow up at the next scheduled Interval.   Encounter Outcome:  Patient Visit Completed   Juanell Fairly RN, BSN, [facility]  [facility], [facility]  Care Coordinator Phone: [phone]

## 2022-12-26 ENCOUNTER — Other Ambulatory Visit (HOSPITAL_COMMUNITY): Payer: Self-pay | Admitting: Urology

## 2022-12-26 ENCOUNTER — Other Ambulatory Visit: Payer: Self-pay | Admitting: Gastroenterology

## 2022-12-26 DIAGNOSIS — C7802 Secondary malignant neoplasm of left lung: Secondary | ICD-10-CM

## 2022-12-26 DIAGNOSIS — C7801 Secondary malignant neoplasm of right lung: Secondary | ICD-10-CM

## 2022-12-26 DIAGNOSIS — C642 Malignant neoplasm of left kidney, except renal pelvis: Secondary | ICD-10-CM

## 2022-12-26 DIAGNOSIS — R918 Other nonspecific abnormal finding of lung field: Secondary | ICD-10-CM

## 2022-12-26 DIAGNOSIS — J441 Chronic obstructive pulmonary disease with (acute) exacerbation: Secondary | ICD-10-CM | POA: Diagnosis not present

## 2022-12-26 LAB — COVID-19, FLU A+B NAA
Influenza A, NAA: NOT DETECTED
Influenza B, NAA: NOT DETECTED
SARS-CoV-2, NAA: NOT DETECTED

## 2022-12-27 ENCOUNTER — Ambulatory Visit
Admission: RE | Admit: 2022-12-27 | Discharge: 2022-12-27 | Disposition: A | Discharge status: Home / Self Care | Payer: 59 | Source: Ambulatory Visit | Attending: Urology | Admitting: Urology

## 2022-12-27 DIAGNOSIS — Z905 Acquired absence of kidney: Secondary | ICD-10-CM | POA: Diagnosis not present

## 2022-12-27 DIAGNOSIS — N289 Disorder of kidney and ureter, unspecified: Secondary | ICD-10-CM | POA: Diagnosis not present

## 2022-12-27 DIAGNOSIS — K760 Fatty (change of) liver, not elsewhere classified: Secondary | ICD-10-CM | POA: Diagnosis not present

## 2022-12-27 DIAGNOSIS — C642 Malignant neoplasm of left kidney, except renal pelvis: Secondary | ICD-10-CM

## 2022-12-27 MED ORDER — GADOPICLENOL 0.5 MMOL/ML IV SOLN
8.0000 mL | Freq: Once | INTRAVENOUS | Status: AC | PRN
  Administered 2022-12-27: 8 mL via INTRAVENOUS

## 2022-12-29 ENCOUNTER — Other Ambulatory Visit: Payer: Self-pay | Admitting: Student in an Organized Health Care Education/Training Program

## 2022-12-29 ENCOUNTER — Other Ambulatory Visit: Payer: Self-pay | Admitting: Nurse Practitioner

## 2022-12-29 ENCOUNTER — Ambulatory Visit (HOSPITAL_COMMUNITY): Payer: 59

## 2022-12-29 ENCOUNTER — Ambulatory Visit (HOSPITAL_COMMUNITY)
Admission: RE | Admit: 2022-12-29 | Discharge: 2022-12-29 | Disposition: A | Discharge status: Home / Self Care | Payer: 59 | Source: Ambulatory Visit | Attending: Student in an Organized Health Care Education/Training Program | Admitting: Student in an Organized Health Care Education/Training Program

## 2022-12-29 DIAGNOSIS — G44221 Chronic tension-type headache, intractable: Secondary | ICD-10-CM

## 2022-12-29 DIAGNOSIS — J439 Emphysema, unspecified: Secondary | ICD-10-CM | POA: Diagnosis not present

## 2022-12-29 DIAGNOSIS — R911 Solitary pulmonary nodule: Secondary | ICD-10-CM

## 2022-12-29 DIAGNOSIS — G44201 Tension-type headache, unspecified, intractable: Secondary | ICD-10-CM

## 2022-12-29 DIAGNOSIS — J441 Chronic obstructive pulmonary disease with (acute) exacerbation: Secondary | ICD-10-CM

## 2022-12-29 DIAGNOSIS — I700 Atherosclerosis of aorta: Secondary | ICD-10-CM | POA: Diagnosis not present

## 2022-12-29 DIAGNOSIS — R918 Other nonspecific abnormal finding of lung field: Secondary | ICD-10-CM | POA: Diagnosis not present

## 2022-12-30 ENCOUNTER — Encounter: Payer: Self-pay | Admitting: Primary Care

## 2022-12-30 ENCOUNTER — Ambulatory Visit: Payer: 59 | Admitting: Primary Care

## 2022-12-30 ENCOUNTER — Telehealth: Payer: Self-pay | Admitting: Primary Care

## 2022-12-30 VITALS — BP 127/84 | HR 64 | Temp 98.2°F | Ht 69.0 in | Wt 168.4 lb

## 2022-12-30 DIAGNOSIS — J449 Chronic obstructive pulmonary disease, unspecified: Secondary | ICD-10-CM

## 2022-12-30 DIAGNOSIS — J9611 Chronic respiratory failure with hypoxia: Secondary | ICD-10-CM

## 2022-12-30 DIAGNOSIS — C3490 Malignant neoplasm of unspecified part of unspecified bronchus or lung: Secondary | ICD-10-CM

## 2022-12-30 MED ORDER — PREDNISONE 20 MG PO TABS: 20.0000 mg | ORAL_TABLET | Freq: Every day | ORAL | 3 refills | Status: DC

## 2022-12-30 MED ORDER — IPRATROPIUM-ALBUTEROL 0.5-2.5 (3) MG/3ML IN SOLN: 3.0000 mL | Freq: Four times a day (QID) | RESPIRATORY_TRACT | 1 refills | Status: DC | PRN

## 2022-12-30 MED ORDER — DOXYCYCLINE HYCLATE 100 MG PO TABS: ORAL_TABLET | ORAL | 3 refills | Status: DC

## 2022-12-30 MED ORDER — CEFUROXIME AXETIL 250 MG PO TABS: ORAL_TABLET | ORAL | 3 refills | Status: DC

## 2022-12-30 MED ORDER — AZITHROMYCIN 250 MG PO TABS: ORAL_TABLET | ORAL | 3 refills | Status: DC
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
## 2022-12-30 NOTE — Telephone Encounter (Signed)
Patient seen today at [facility] pulmonary. He had CT chest yesterday, results are pending. CT abdomen on 12/27/22, results pending.   Did you still want patient to have PET scan, looks like it has been scheduled for 01/02/23. He was not aware of PET scan being scheduled.   He also has a brain MRI schedule on 01/06/23 ordered by Dr. Carlynn Purl but he had a MRI brain on 11/13 with [facility]

## 2022-12-30 NOTE — Patient Instructions (Addendum)
-  SEVERE COPD AND CHRONIC RESPIRATORY FAILURE: Chronic Obstructive Pulmonary Disease (COPD) is a long-term lung condition that makes it hard to breathe. Despite your current treatments, you are still experiencing symptoms. We will continue your current medications, including  Trelegy, prednisone 20mg  daily, renew your rotating antibiotic regimen. Your nebulizer medication will also be refilled.  Take Doxycycline first week of December, March, June and September  Take Ceftin first week of January, April, July, October  Take Azithromycin first week of February, May, August, November   -POSSIBLE LUNG CANCER: We are awaiting the results of your recent CT scan to determine if there are any cancerous growths in your lungs. I have reached out to Dr. Liliane Shi about PET scan and brain MRI that was ordered to clarify.   -PALLIATIVE CARE: Palliative care focuses on providing relief from the symptoms and stress of serious illness. We discussed the benefits of palliative care for managing your symptoms and providing additional support. We may consider a referral to palliative care for you.  Follow-up 3 months with Dr. Delton Coombes

## 2023-01-02 ENCOUNTER — Ambulatory Visit (HOSPITAL_COMMUNITY): Payer: 59

## 2023-01-06 ENCOUNTER — Ambulatory Visit (HOSPITAL_COMMUNITY)
Admission: RE | Admit: 2023-01-06 | Discharge: 2023-01-06 | Disposition: A | Discharge status: Home / Self Care | Payer: 59 | Source: Ambulatory Visit | Attending: Student in an Organized Health Care Education/Training Program | Admitting: Student in an Organized Health Care Education/Training Program

## 2023-01-06 DIAGNOSIS — G44201 Tension-type headache, unspecified, intractable: Secondary | ICD-10-CM | POA: Diagnosis not present

## 2023-01-06 DIAGNOSIS — I6782 Cerebral ischemia: Secondary | ICD-10-CM | POA: Diagnosis not present

## 2023-01-08 DIAGNOSIS — R3915 Urgency of urination: Secondary | ICD-10-CM | POA: Diagnosis not present

## 2023-01-08 DIAGNOSIS — R350 Frequency of micturition: Secondary | ICD-10-CM | POA: Diagnosis not present

## 2023-01-09 ENCOUNTER — Encounter (HOSPITAL_COMMUNITY)
Admission: RE | Admit: 2023-01-09 | Discharge: 2023-01-09 | Disposition: A | Discharge status: Home / Self Care | Payer: 59 | Source: Ambulatory Visit | Attending: Urology | Admitting: Urology

## 2023-01-09 ENCOUNTER — Other Ambulatory Visit: Payer: Self-pay | Admitting: Gastroenterology

## 2023-01-09 DIAGNOSIS — C7802 Secondary malignant neoplasm of left lung: Secondary | ICD-10-CM | POA: Diagnosis not present

## 2023-01-09 DIAGNOSIS — C3490 Malignant neoplasm of unspecified part of unspecified bronchus or lung: Secondary | ICD-10-CM | POA: Diagnosis not present

## 2023-01-09 DIAGNOSIS — C642 Malignant neoplasm of left kidney, except renal pelvis: Secondary | ICD-10-CM | POA: Diagnosis not present

## 2023-01-09 DIAGNOSIS — C7801 Secondary malignant neoplasm of right lung: Secondary | ICD-10-CM | POA: Insufficient documentation

## 2023-01-09 DIAGNOSIS — R918 Other nonspecific abnormal finding of lung field: Secondary | ICD-10-CM | POA: Insufficient documentation

## 2023-01-09 LAB — GLUCOSE, CAPILLARY: Glucose-Capillary: 94 mg/dL (ref 70–99)

## 2023-01-09 MED ORDER — FLUDEOXYGLUCOSE F - 18 (FDG) INJECTION
8.4000 | Freq: Once | INTRAVENOUS | Status: AC
  Administered 2023-01-09: 8.4 via INTRAVENOUS

## 2023-01-12 ENCOUNTER — Inpatient Hospital Stay: Payer: 59

## 2023-01-12 ENCOUNTER — Other Ambulatory Visit: Payer: Self-pay

## 2023-01-12 ENCOUNTER — Inpatient Hospital Stay: Payer: 59 | Attending: Internal Medicine | Admitting: Internal Medicine

## 2023-01-12 VITALS — BP 128/82 | HR 60 | Temp 97.3°F | Resp 17 | Ht 69.0 in | Wt 166.8 lb

## 2023-01-12 DIAGNOSIS — R918 Other nonspecific abnormal finding of lung field: Secondary | ICD-10-CM | POA: Diagnosis not present

## 2023-01-12 DIAGNOSIS — G8929 Other chronic pain: Secondary | ICD-10-CM | POA: Diagnosis not present

## 2023-01-12 DIAGNOSIS — C3490 Malignant neoplasm of unspecified part of unspecified bronchus or lung: Secondary | ICD-10-CM

## 2023-01-12 DIAGNOSIS — Z905 Acquired absence of kidney: Secondary | ICD-10-CM | POA: Diagnosis not present

## 2023-01-12 DIAGNOSIS — C3412 Malignant neoplasm of upper lobe, left bronchus or lung: Secondary | ICD-10-CM

## 2023-01-12 DIAGNOSIS — I252 Old myocardial infarction: Secondary | ICD-10-CM | POA: Diagnosis not present

## 2023-01-12 DIAGNOSIS — M549 Dorsalgia, unspecified: Secondary | ICD-10-CM | POA: Diagnosis not present

## 2023-01-12 DIAGNOSIS — F1491 Cocaine use, unspecified, in remission: Secondary | ICD-10-CM | POA: Insufficient documentation

## 2023-01-12 DIAGNOSIS — J449 Chronic obstructive pulmonary disease, unspecified: Secondary | ICD-10-CM | POA: Diagnosis not present

## 2023-01-12 DIAGNOSIS — R519 Headache, unspecified: Secondary | ICD-10-CM | POA: Insufficient documentation

## 2023-01-12 DIAGNOSIS — Z902 Acquired absence of lung [part of]: Secondary | ICD-10-CM | POA: Insufficient documentation

## 2023-01-12 DIAGNOSIS — Z85118 Personal history of other malignant neoplasm of bronchus and lung: Secondary | ICD-10-CM | POA: Diagnosis not present

## 2023-01-12 DIAGNOSIS — Z85528 Personal history of other malignant neoplasm of kidney: Secondary | ICD-10-CM | POA: Diagnosis not present

## 2023-01-12 DIAGNOSIS — Z9981 Dependence on supplemental oxygen: Secondary | ICD-10-CM | POA: Diagnosis not present

## 2023-01-12 DIAGNOSIS — Z87891 Personal history of nicotine dependence: Secondary | ICD-10-CM | POA: Diagnosis not present

## 2023-01-12 LAB — CBC WITH DIFFERENTIAL (CANCER CENTER ONLY)
Abs Immature Granulocytes: 0.23 10*3/uL — ABNORMAL HIGH (ref 0.00–0.07)
Basophils Absolute: 0.1 10*3/uL (ref 0.0–0.1)
Basophils Relative: 1 %
Eosinophils Absolute: 0.1 10*3/uL (ref 0.0–0.5)
Eosinophils Relative: 1 %
HCT: 41.2 % (ref 39.0–52.0)
Hemoglobin: 13.5 g/dL (ref 13.0–17.0)
Immature Granulocytes: 2 %
Lymphocytes Relative: 10 %
Lymphs Abs: 1.1 10*3/uL (ref 0.7–4.0)
MCH: 31.5 pg (ref 26.0–34.0)
MCHC: 32.8 g/dL (ref 30.0–36.0)
MCV: 96.3 fL (ref 80.0–100.0)
Monocytes Absolute: 0.7 10*3/uL (ref 0.1–1.0)
Monocytes Relative: 6 %
Neutro Abs: 9 10*3/uL — ABNORMAL HIGH (ref 1.7–7.7)
Neutrophils Relative %: 80 %
Platelet Count: 343 10*3/uL (ref 150–400)
RBC: 4.28 MIL/uL (ref 4.22–5.81)
RDW: 14.6 % (ref 11.5–15.5)
WBC Count: 11.2 10*3/uL — ABNORMAL HIGH (ref 4.0–10.5)
nRBC: 0 % (ref 0.0–0.2)

## 2023-01-12 LAB — CMP (CANCER CENTER ONLY)
ALT: 13 U/L (ref 0–44)
AST: 14 U/L — ABNORMAL LOW (ref 15–41)
Albumin: 4.1 g/dL (ref 3.5–5.0)
Alkaline Phosphatase: 75 U/L (ref 38–126)
Anion gap: 7 (ref 5–15)
BUN: 13 mg/dL (ref 8–23)
CO2: 29 mmol/L (ref 22–32)
Calcium: 9.2 mg/dL (ref 8.9–10.3)
Chloride: 103 mmol/L (ref 98–111)
Creatinine: 1.09 mg/dL (ref 0.61–1.24)
GFR, Estimated: >60 mL/min (ref >60)
Glucose, Bld: 87 mg/dL (ref 70–99)
Potassium: 4 mmol/L (ref 3.5–5.1)
Sodium: 139 mmol/L (ref 135–145)
Total Bilirubin: 0.6 mg/dL (ref <1.2)
Total Protein: 7.1 g/dL (ref 6.5–8.1)
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
## 2023-01-12 NOTE — Progress Notes (Signed)
I met with the pt face to face today at his medical oncology consultation with Dr.Mohamed. It was Dr Pollie Friar recommendation that the pt get a bronchoscopy to biopsy the nodules in his left and right lungs. I explained to the pt that it was important that we know where the nodules are coming from, if it's from his lungs or his kidneys. Pt verbalized understanding. I provided the pt with my card and encouraged the pt to call me with any concerns or questions.

## 2023-01-12 NOTE — Progress Notes (Signed)
Lab orders

## 2023-01-14 ENCOUNTER — Other Ambulatory Visit: Payer: Self-pay

## 2023-01-14 DIAGNOSIS — C3490 Malignant neoplasm of unspecified part of unspecified bronchus or lung: Secondary | ICD-10-CM

## 2023-01-15 ENCOUNTER — Telehealth: Payer: Self-pay | Admitting: Primary Care

## 2023-01-15 NOTE — Telephone Encounter (Signed)
[place] states patient is enrolled in palliative care. Nurse Practioner to see patient at home. Vennie phone number is [phone].

## 2023-01-17 NOTE — Telephone Encounter (Signed)
Routing to Dr. Lamonte Sakai as an [name]

## 2023-01-19 ENCOUNTER — Telehealth: Payer: Self-pay | Admitting: Internal Medicine

## 2023-01-19 NOTE — Telephone Encounter (Signed)
Thank you :)

## 2023-01-19 NOTE — Telephone Encounter (Signed)
Left patient a message in regards of scheduled appointment times/dates

## 2023-01-19 NOTE — Telephone Encounter (Signed)
Called Patient. Could not complete VM as the answering machine continuously stated "No VM left yet please leave a message after the tone."

## 2023-01-20 ENCOUNTER — Ambulatory Visit (INDEPENDENT_AMBULATORY_CARE_PROVIDER_SITE_OTHER): Payer: 59 | Admitting: Internal Medicine

## 2023-01-20 ENCOUNTER — Encounter: Payer: Self-pay | Admitting: Internal Medicine

## 2023-01-20 ENCOUNTER — Other Ambulatory Visit: Payer: Self-pay

## 2023-01-20 VITALS — BP 133/64 | HR 81 | Temp 97.8°F | Ht 69.0 in | Wt 167.2 lb

## 2023-01-20 DIAGNOSIS — R519 Headache, unspecified: Secondary | ICD-10-CM

## 2023-01-20 DIAGNOSIS — F32A Depression, unspecified: Secondary | ICD-10-CM | POA: Diagnosis not present

## 2023-01-20 DIAGNOSIS — Z79899 Other long term (current) drug therapy: Secondary | ICD-10-CM

## 2023-01-20 DIAGNOSIS — R911 Solitary pulmonary nodule: Secondary | ICD-10-CM | POA: Diagnosis not present

## 2023-01-20 DIAGNOSIS — F419 Anxiety disorder, unspecified: Secondary | ICD-10-CM

## 2023-01-20 MED ORDER — ZONISAMIDE 25 MG PO CAPS: 25.0000 mg | ORAL_CAPSULE | Freq: Every day | ORAL | 1 refills | Status: DC

## 2023-01-20 MED ORDER — ESCITALOPRAM OXALATE 10 MG PO TABS: 10.0000 mg | ORAL_TABLET | Freq: Every day | ORAL | 1 refills | Status: DC

## 2023-01-20 NOTE — Assessment & Plan Note (Signed)
CT findings after incidental lung nodule found on CXR shows findings concerning for malignancy. Patient has been seen by oncology and has bronchoscopy and biopsy planned. He has also been established with palliative care.

## 2023-01-20 NOTE — Assessment & Plan Note (Signed)
Extensive amount of time was spent going over medication list and explaining indications for each. Will continue to decrease medication load going forward.

## 2023-01-20 NOTE — Assessment & Plan Note (Addendum)
Patient has been seeing [facility] for chronic headaches. He was started on Zonegran 50mg , which has been helpful for his headaches but also makes him groggy the next morning. Per last encounter dosage was decreased to 25mg , but patient claims to still be taking 50mg .  - Trial Zonegran 25mg  at bedtime.

## 2023-01-20 NOTE — Assessment & Plan Note (Signed)
Patient has not been taking Lexapro. He reports increased feelings of anxiety and depression, somewhat due to stress of new possible diagnosis of cancer. PHQ9 score is 4 today.  - Restart Lexapro 10mg , f/u in 1 month to increase dose as needed
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
## 2023-01-20 NOTE — Patient Instructions (Addendum)
[name],   It was a pleasure seeing you again today.   For your headaches, let's try a lower dose of your Zonegran (25mg ). Hopefully this will help with your drowsiness.  For your anxiety, I have prescribed Lexapro 10mg . We will see you again in about 4 weeks to check up and increase the dose if needed.

## 2023-01-21 ENCOUNTER — Ambulatory Visit: Payer: Self-pay

## 2023-01-21 NOTE — Patient Outreach (Signed)
  Care Coordination   Follow Up Visit Note   01/21/2023 Name: [name] MRN: [id number] DOB: [birth year]  [name] is a 67 y.o. year old male who sees Monna Fam, MD for primary care. I spoke with  [name] by phone today.  What matters to the patients health and wellness today?  [name] reported that he was experiencing health issues today, including a headache, low energy levels, and the presence of phlegm. He indicated that he did not have a fever but noted that his shortness of breath felt more pronounced than usual. I advised him that this would be an opportune time to consult with his physician to explore potential interventions and to determine if an appointment was necessary. [name] expressed his willingness to follow through on this recommendation, as our aim is to prevent any need for hospitalization. I plan to follow up with him on Friday. Furthermore, I inquired whether he had engaged with palliative care, to which he responded affirmatively. He mentioned that he had spoken with an individual named [facility], who facilitated his connection with [facility] and informed him that they would provide assistance with hospice services when the appropriate time arises.    Goals Addressed             This Visit's Progress    Maintain, Monitor and Self-Manage Symptoms of COPD       Patient Goals/Self Care Activities: -Patient/Caregiver will take medications as prescribed   -Patient/Caregiver will attend all scheduled provider appointments -Patient/Caregiver will call pharmacy for medication refills 3-7 days in advance of running out of medications -Patient/Caregiver will call provider office for new concerns or questions  -Patient/Caregiver will focus on medication adherence by taking medications as prescribed   Advised patient to track and manage COPD triggers Advised patient to self assesses COPD action plan zone and make appointment with provider if in the  yellow zone for 48 hours without improvement Discussed the importance of adequate rest and management of fatigue with COPD Avoid irritants that could exacerbate your condition Drink fluids Advised him to see physician if symptoms are not better         SDOH assessments and interventions completed:  No     Care Coordination Interventions:  Yes, provided   Interventions Today    Flowsheet Row Most Recent Value  Chronic Disease   Chronic disease during today's visit Chronic Obstructive Pulmonary Disease (COPD)  General Interventions   General Interventions Discussed/Reviewed General Interventions Discussed, Sick Day Rules  Nutrition Interventions   Nutrition Discussed/Reviewed Nutrition Discussed  Safety Interventions   Safety Discussed/Reviewed Safety Discussed        Follow up plan: Follow up call scheduled for 01/23/23  3 pm    Encounter Outcome:  Patient Visit Completed   Juanell Fairly RN, BSN, [facility] [place]  [facility], [facility]  Care Coordinator Phone: [phone]

## 2023-01-21 NOTE — Patient Instructions (Signed)
Visit Information  Thank you for taking time to visit with me today. Please don't hesitate to contact me if I can be of assistance to you.   Following are the goals we discussed today:   Goals Addressed             This Visit's Progress    Maintain, Monitor and Self-Manage Symptoms of COPD       Patient Goals/Self Care Activities: -Patient/Caregiver will take medications as prescribed   -Patient/Caregiver will attend all scheduled provider appointments -Patient/Caregiver will call pharmacy for medication refills 3-7 days in advance of running out of medications -Patient/Caregiver will call provider office for new concerns or questions  -Patient/Caregiver will focus on medication adherence by taking medications as prescribed   Advised patient to track and manage COPD triggers Advised patient to self assesses COPD action plan zone and make appointment with provider if in the yellow zone for 48 hours without improvement Discussed the importance of adequate rest and management of fatigue with COPD Avoid irritants that could exacerbate your condition Drink fluids Advised him to see physician if symptoms are not better         Our next appointment is by telephone on 01/23/23 at 3 pm  Please call the care guide team at [phone] if you need to cancel or reschedule your appointment.   If you are experiencing a Mental Health or Behavioral Health Crisis or need someone to talk to, please call 1-800-273-TALK (toll free, 24 hour hotline)  Patient verbalizes understanding of instructions and care plan provided today and agrees to view in MyChart. Active MyChart status and patient understanding of how to access instructions and care plan via MyChart confirmed with patient.     Juanell Fairly RN, BSN, [facility]  [facility], [facility]  Care Coordinator Phone: [phone]

## 2023-01-22 NOTE — Progress Notes (Signed)
Internal Medicine Clinic Attending  I was physically present during the key portions of the resident provided service and participated in the medical decision making of patient's management care. I reviewed pertinent patient test results.  The assessment, diagnosis, and plan were formulated together and I agree with the documentation in the resident's note.  Mercie Eon, MD

## 2023-01-23 ENCOUNTER — Other Ambulatory Visit: Payer: Self-pay | Admitting: Gastroenterology

## 2023-01-23 ENCOUNTER — Ambulatory Visit: Payer: Self-pay

## 2023-01-23 NOTE — Patient Instructions (Signed)
Visit Information  Thank you for taking time to visit with me today. Please don't hesitate to contact me if I can be of assistance to you.   Following are the goals we discussed today:   Goals Addressed             This Visit's Progress    Maintain, Monitor and Self-Manage Symptoms of COPD       Patient Goals/Self Care Activities: -Patient/Caregiver will take medications as prescribed   -Patient/Caregiver will attend all scheduled provider appointments -Patient/Caregiver will call pharmacy for medication refills 3-7 days in advance of running out of medications -Patient/Caregiver will call provider office for new concerns or questions  -Patient/Caregiver will focus on medication adherence by taking medications as prescribed   Advised patient to track and manage COPD triggers Advised patient to self assesses COPD action plan zone and make appointment with provider if in the yellow zone for 48 hours without improvement Discussed the importance of adequate rest and management of fatigue with COPD Avoid irritants that could exacerbate your condition Drink fluids Advised him to see physician if symptoms are not better Take you medications Wear a mask when out around others         Our next appointment is by telephone on 03/18/23 at 200 pm  Please call the care guide team at [phone] if you need to cancel or reschedule your appointment.   If you are experiencing a Mental Health or Behavioral Health Crisis or need someone to talk to, please call 1-800-273-TALK (toll free, 24 hour hotline)  Patient verbalizes understanding of instructions and care plan provided today and agrees to view in MyChart. Active MyChart status and patient understanding of how to access instructions and care plan via MyChart confirmed with patient.     Juanell Fairly RN, BSN, [facility] [place]  [facility], [facility]  Care Coordinator Phone: [phone]

## 2023-01-23 NOTE — Patient Outreach (Signed)
  Care Coordination   Follow Up Visit Note   01/23/2023 Name: BRADDOCK THORNGREN MRN: [id number] DOB: 03/02/1955  [name] is a 67 y.o. year old male who sees Monna Fam, MD for primary care. I spoke with  [name] by phone today.  What matters to the patients health and wellness today?  Mr. Grady has indicated that he is feeling better today. He has been taking Zonegran 50 mg, which has contributed to the alleviation of his headache. While his breathing remains consistent, he continues to make progress. He is actively seeking opportunities to socialize with family to prevent increased anxiety from prolonged isolation at home. He is also taking  Lexapro 10 mg. I advised him to exercise caution when engaging in outdoor activities, mainly due to the rising incidence of influenza and respiratory syncytial virus (RSV). I emphasized the importance of wearing a mask, given his pre-existing respiratory issues, as this could prevent further complications. He acknowledged and agreed with my recommendations.    Goals Addressed             This Visit's Progress    Maintain, Monitor and Self-Manage Symptoms of COPD       Patient Goals/Self Care Activities: -Patient/Caregiver will take medications as prescribed   -Patient/Caregiver will attend all scheduled provider appointments -Patient/Caregiver will call pharmacy for medication refills 3-7 days in advance of running out of medications -Patient/Caregiver will call provider office for new concerns or questions  -Patient/Caregiver will focus on medication adherence by taking medications as prescribed   Advised patient to track and manage COPD triggers Advised patient to self assesses COPD action plan zone and make appointment with provider if in the yellow zone for 48 hours without improvement Discussed the importance of adequate rest and management of fatigue with COPD Avoid irritants that could exacerbate your condition Drink  fluids Advised him to see physician if symptoms are not better Take you medications Wear a mask when out around others         SDOH assessments and interventions completed:  No     Care Coordination Interventions:  Yes, provided   Interventions Today    Flowsheet Row Most Recent Value  Chronic Disease   Chronic disease during today's visit Chronic Obstructive Pulmonary Disease (COPD)  General Interventions   General Interventions Discussed/Reviewed General Interventions Discussed, Sick Day Rules  Education Interventions   Education Provided Provided Education  Pharmacy Interventions   Pharmacy Dicussed/Reviewed Pharmacy Topics Discussed  Safety Interventions   Safety Discussed/Reviewed Safety Discussed        Follow up plan: Follow up call scheduled for 03/18/23  2 pm    Encounter Outcome:  Patient Visit Completed   Juanell Fairly RN, BSN, [facility] [place]  [facility], [facility]  Care Coordinator Phone: [phone]

## 2023-01-25 DIAGNOSIS — J441 Chronic obstructive pulmonary disease with (acute) exacerbation: Secondary | ICD-10-CM | POA: Diagnosis not present

## 2023-01-26 ENCOUNTER — Ambulatory Visit (INDEPENDENT_AMBULATORY_CARE_PROVIDER_SITE_OTHER): Payer: 59 | Admitting: Licensed Clinical Social Worker

## 2023-01-26 DIAGNOSIS — F419 Anxiety disorder, unspecified: Secondary | ICD-10-CM | POA: Diagnosis not present

## 2023-01-26 DIAGNOSIS — F32A Depression, unspecified: Secondary | ICD-10-CM | POA: Diagnosis not present

## 2023-01-26 NOTE — BH Specialist Note (Signed)
Integrated Behavioral Health Follow Up In-Person Visit  MRN: [id number] Name: [name]  Number of Integrated Behavioral Health Clinician visits: Additional Visit  Session Start time: 1430   Session End time: 1530  Total time in minutes: 60   Types of Service: Individual psychotherapy  Interpretor:No. Interpretor Name and Language: N/A  Subjective: [name] is a 67 y.o. male  Patient was referred by PCP for Behavioral Health. Patient reports the following symptoms/concerns: Licensed Clinical Engineer, building services (LCSW-A) and Visual merchandiser [facility]) conducted a session with a patient who is adjusting to palliative care. The patient is experiencing some difficulty accepting help. During the session, the [facility] discussed the benefits of palliative care and how it could positively impact the patient's life. The patient opened up about the challenges they are facing with the various changes occurring as they age. The healthcare providers worked to address these concerns and provide support. Palliative care offers numerous benefits for patients with serious illnesses. These include improved quality of life, better symptom management, reduced risk of depression, and potentially longer survival13. It can also provide support in making decisions about care and treatment, as well as offering emotional and practical assistance to both the patient and their family members12. Some coping skills that may be beneficial for the patient include: Engaging in open communication with healthcare providers and family members Participating in counseling or support groups Practicing relaxation techniques such as deep breathing or meditation Maintaining social connections and seeking support from loved ones Setting realistic goals and prioritizing activities that bring joy and comfort Benefits of accepting palliative care support include: Comprehensive symptom management, including pain  relief Improved understanding of the illness and its progression Access to resources and support services Enhanced quality of life and emotional well-being Reduced stress for both the patient and their caregivers Personalized care that aligns with the patient's values and preferences  Objective: Mood: NA and Affect: Appropriate Risk of harm to self or others: No plan to harm self or others    Patient and/or Family's Strengths/Protective Factors: Social connections  Goals Addressed: Patient will:  Reduce symptoms of: anxiety and depression    Progress towards Goals: Ongoing  Interventions: Interventions utilized:  CBT Cognitive Behavioral Therapy Standardized Assessments completed: Not Needed  Patient and/or Family Response: Patient agrees to continued services  Patient Centered Plan: Patient is on the following Treatment Plan(s): OPT Assessment: Patient currently experiencing Anxiety and Depression.   Patient may benefit from OPT.  Plan: Follow up with behavioral health clinician on : Within the next 60 days  Christen Butter, MSW, LCSW-A She/Her Behavioral Health Clinician [facility]  Internal Medicine Center Direct [phone]  Fax [phone] Main Office Phone: [phone] [address] [place] [place], [place] [address] Website: [facility] Internal Medicine [facility]  [place], [place]  [facility]

## 2023-01-27 ENCOUNTER — Other Ambulatory Visit: Payer: Self-pay | Admitting: Nurse Practitioner

## 2023-01-27 DIAGNOSIS — M5416 Radiculopathy, lumbar region: Secondary | ICD-10-CM | POA: Diagnosis not present

## 2023-01-27 DIAGNOSIS — Z79899 Other long term (current) drug therapy: Secondary | ICD-10-CM | POA: Diagnosis not present

## 2023-01-27 DIAGNOSIS — M7918 Myalgia, other site: Secondary | ICD-10-CM | POA: Diagnosis not present

## 2023-01-27 DIAGNOSIS — M47816 Spondylosis without myelopathy or radiculopathy, lumbar region: Secondary | ICD-10-CM | POA: Diagnosis not present

## 2023-01-27 DIAGNOSIS — G8929 Other chronic pain: Secondary | ICD-10-CM | POA: Diagnosis not present

## 2023-01-27 DIAGNOSIS — G8912 Acute post-thoracotomy pain: Secondary | ICD-10-CM | POA: Diagnosis not present

## 2023-02-03 ENCOUNTER — Other Ambulatory Visit: Payer: Self-pay | Admitting: Gastroenterology

## 2023-02-03 DIAGNOSIS — G43009 Migraine without aura, not intractable, without status migrainosus: Secondary | ICD-10-CM | POA: Diagnosis not present

## 2023-02-03 DIAGNOSIS — G250 Essential tremor: Secondary | ICD-10-CM | POA: Diagnosis not present

## 2023-02-03 DIAGNOSIS — Z79899 Other long term (current) drug therapy: Secondary | ICD-10-CM | POA: Diagnosis not present

## 2023-02-12 ENCOUNTER — Telehealth: Payer: Self-pay | Admitting: *Deleted

## 2023-02-12 NOTE — Telephone Encounter (Signed)
 After speaking with Mr Nack, the agency that is providing his care is [facility] [phone]. Left voice message for office to return my call to [phone] with the total amount of hours patient is receiving.  Waiting call back.

## 2023-02-13 ENCOUNTER — Telehealth: Payer: Self-pay | Admitting: *Deleted

## 2023-02-13 NOTE — Telephone Encounter (Signed)
 Spoke with patient regarding the amount of hours he is receiving for PCS. Patient states he is receiving 80 hours per month.

## 2023-02-15 ENCOUNTER — Encounter (HOSPITAL_COMMUNITY): Payer: Self-pay

## 2023-02-15 ENCOUNTER — Emergency Department (HOSPITAL_COMMUNITY)
Admission: EM | Admit: 2023-02-15 | Discharge: 2023-02-15 | Disposition: A | Discharge status: Home / Self Care | Payer: 59 | Attending: Emergency Medicine | Admitting: Emergency Medicine

## 2023-02-15 ENCOUNTER — Other Ambulatory Visit: Payer: Self-pay

## 2023-02-15 ENCOUNTER — Emergency Department (HOSPITAL_COMMUNITY): Payer: 59

## 2023-02-15 DIAGNOSIS — I700 Atherosclerosis of aorta: Secondary | ICD-10-CM | POA: Insufficient documentation

## 2023-02-15 DIAGNOSIS — Z85118 Personal history of other malignant neoplasm of bronchus and lung: Secondary | ICD-10-CM | POA: Insufficient documentation

## 2023-02-15 DIAGNOSIS — R918 Other nonspecific abnormal finding of lung field: Secondary | ICD-10-CM | POA: Diagnosis not present

## 2023-02-15 DIAGNOSIS — R0981 Nasal congestion: Secondary | ICD-10-CM | POA: Diagnosis present

## 2023-02-15 DIAGNOSIS — J210 Acute bronchiolitis due to respiratory syncytial virus: Secondary | ICD-10-CM | POA: Insufficient documentation

## 2023-02-15 DIAGNOSIS — J181 Lobar pneumonia, unspecified organism: Secondary | ICD-10-CM | POA: Insufficient documentation

## 2023-02-15 DIAGNOSIS — J189 Pneumonia, unspecified organism: Secondary | ICD-10-CM | POA: Insufficient documentation

## 2023-02-15 DIAGNOSIS — R059 Cough, unspecified: Secondary | ICD-10-CM | POA: Diagnosis not present

## 2023-02-15 DIAGNOSIS — R197 Diarrhea, unspecified: Secondary | ICD-10-CM | POA: Diagnosis not present

## 2023-02-15 DIAGNOSIS — J439 Emphysema, unspecified: Secondary | ICD-10-CM | POA: Diagnosis not present

## 2023-02-15 DIAGNOSIS — M47814 Spondylosis without myelopathy or radiculopathy, thoracic region: Secondary | ICD-10-CM | POA: Diagnosis not present

## 2023-02-15 DIAGNOSIS — J449 Chronic obstructive pulmonary disease, unspecified: Secondary | ICD-10-CM | POA: Insufficient documentation

## 2023-02-15 DIAGNOSIS — J029 Acute pharyngitis, unspecified: Secondary | ICD-10-CM | POA: Diagnosis not present

## 2023-02-15 DIAGNOSIS — Z9981 Dependence on supplemental oxygen: Secondary | ICD-10-CM | POA: Insufficient documentation

## 2023-02-15 DIAGNOSIS — R070 Pain in throat: Secondary | ICD-10-CM | POA: Diagnosis not present

## 2023-02-15 DIAGNOSIS — Z743 Need for continuous supervision: Secondary | ICD-10-CM | POA: Diagnosis not present

## 2023-02-15 DIAGNOSIS — I10 Essential (primary) hypertension: Secondary | ICD-10-CM | POA: Diagnosis not present

## 2023-02-15 DIAGNOSIS — R079 Chest pain, unspecified: Secondary | ICD-10-CM | POA: Diagnosis not present

## 2023-02-15 DIAGNOSIS — Z20822 Contact with and (suspected) exposure to covid-19: Secondary | ICD-10-CM | POA: Insufficient documentation

## 2023-02-15 DIAGNOSIS — R1111 Vomiting without nausea: Secondary | ICD-10-CM | POA: Diagnosis not present

## 2023-02-15 DIAGNOSIS — C3490 Malignant neoplasm of unspecified part of unspecified bronchus or lung: Secondary | ICD-10-CM | POA: Diagnosis not present

## 2023-02-15 LAB — RESP PANEL BY RT-PCR (RSV, FLU A&B, COVID)  RVPGX2
Influenza A by PCR: NEGATIVE
Influenza B by PCR: NEGATIVE
Resp Syncytial Virus by PCR: POSITIVE — AB
SARS Coronavirus 2 by RT PCR: NEGATIVE

## 2023-02-15 LAB — COMPREHENSIVE METABOLIC PANEL
ALT: 13 U/L (ref 0–44)
AST: 18 U/L (ref 15–41)
Albumin: 3.5 g/dL (ref 3.5–5.0)
Alkaline Phosphatase: 78 U/L (ref 38–126)
Anion gap: 10 (ref 5–15)
BUN: 10 mg/dL (ref 8–23)
CO2: 23 mmol/L (ref 22–32)
Calcium: 9 mg/dL (ref 8.9–10.3)
Chloride: 100 mmol/L (ref 98–111)
Creatinine, Ser: 1.16 mg/dL (ref 0.61–1.24)
GFR, Estimated: >60 mL/min (ref >60)
Glucose, Bld: 83 mg/dL (ref 70–99)
Potassium: 3.9 mmol/L (ref 3.5–5.1)
Sodium: 133 mmol/L — ABNORMAL LOW (ref 135–145)
Total Bilirubin: 0.5 mg/dL (ref 0.0–1.2)
Total Protein: 7.2 g/dL (ref 6.5–8.1)

## 2023-02-15 LAB — CBC WITH DIFFERENTIAL/PLATELET
Abs Immature Granulocytes: 0.06 10*3/uL (ref 0.00–0.07)
Basophils Absolute: 0 10*3/uL (ref 0.0–0.1)
Basophils Relative: 0 %
Eosinophils Absolute: 0.5 10*3/uL (ref 0.0–0.5)
Eosinophils Relative: 7 %
HCT: 38.6 % — ABNORMAL LOW (ref 39.0–52.0)
Hemoglobin: 12.6 g/dL — ABNORMAL LOW (ref 13.0–17.0)
Immature Granulocytes: 1 %
Lymphocytes Relative: 10 %
Lymphs Abs: 0.7 10*3/uL (ref 0.7–4.0)
MCH: 30.9 pg (ref 26.0–34.0)
MCHC: 32.6 g/dL (ref 30.0–36.0)
MCV: 94.6 fL (ref 80.0–100.0)
Monocytes Absolute: 1.2 10*3/uL — ABNORMAL HIGH (ref 0.1–1.0)
Monocytes Relative: 16 %
Neutro Abs: 5 10*3/uL (ref 1.7–7.7)
Neutrophils Relative %: 66 %
Platelets: 385 10*3/uL (ref 150–400)
RBC: 4.08 MIL/uL — ABNORMAL LOW (ref 4.22–5.81)
RDW: 14.4 % (ref 11.5–15.5)
WBC: 7.6 10*3/uL (ref 4.0–10.5)
nRBC: 0 % (ref 0.0–0.2)

## 2023-02-15 LAB — LIPASE, BLOOD: Lipase: 30 U/L (ref 11–51)

## 2023-02-15 LAB — ETHANOL: Alcohol, Ethyl (B): <10 mg/dL (ref <10)

## 2023-02-15 LAB — TROPONIN I (HIGH SENSITIVITY): Troponin I (High Sensitivity): 5 ng/L (ref <18)

## 2023-02-15 LAB — BRAIN NATRIURETIC PEPTIDE: B Natriuretic Peptide: 61.9 pg/mL (ref 0.0–100.0)

## 2023-02-15 MED ORDER — AZITHROMYCIN 250 MG PO TABS
500.0000 mg | ORAL_TABLET | Freq: Once | ORAL | Status: AC
  Administered 2023-02-15: 500 mg via ORAL
  Filled 2023-02-15: qty 2

## 2023-02-15 MED ORDER — SODIUM CHLORIDE 0.9 % IV BOLUS
1000.0000 mL | Freq: Once | INTRAVENOUS | Status: AC
  Administered 2023-02-15: 1000 mL via INTRAVENOUS

## 2023-02-15 MED ORDER — LORAZEPAM 2 MG/ML IJ SOLN
1.0000 mg | Freq: Once | INTRAMUSCULAR | Status: AC
  Administered 2023-02-15: 1 mg via INTRAVENOUS
  Filled 2023-02-15: qty 1

## 2023-02-15 MED ORDER — AMOXICILLIN-POT CLAVULANATE 875-125 MG PO TABS
1.0000 | ORAL_TABLET | Freq: Once | ORAL | Status: AC
  Administered 2023-02-15: 1 via ORAL
  Filled 2023-02-15: qty 1

## 2023-02-15 MED ORDER — AMOXICILLIN-POT CLAVULANATE 875-125 MG PO TABS: 1.0000 | ORAL_TABLET | Freq: Two times a day (BID) | ORAL | 0 refills | Status: DC

## 2023-02-15 MED ORDER — AZITHROMYCIN 250 MG PO TABS: 250.0000 mg | ORAL_TABLET | Freq: Every day | ORAL | 0 refills | Status: AC

## 2023-02-15 NOTE — ED Notes (Signed)
 Tech informed RN that Pt called for another ride and Ptar was canceled.

## 2023-02-15 NOTE — Discharge Instructions (Signed)
 These check in with your physicians office tomorrow via telephone, discussed today's presentation and schedule appropriate follow-up.  You have been diagnosed with RSV, respiratory syncytial virus, which is possibly complicated by bacterial pneumonia.  Is important to take all medication as directed.  Return here for concerning changes in your condition.

## 2023-02-15 NOTE — ED Triage Notes (Signed)
 Patient brought in by EMS due to cough, sore throat, nausea, vomiting, diarrhea and chills X 3 days. Pt reports productive cough with green sputum. Pt wears home O2 at 2L [place] and has hx of COPD. No other complaints.

## 2023-02-15 NOTE — ED Provider Notes (Signed)
 [facility] AT [facility] Provider Note   CSN: [id number] Arrival date & time: 02/15/23  1419     History  Chief Complaint  Patient presents with   Diarrhea   Cough   Sore Throat    [name] is a 68 y.o. male.  HPI Patient with multiple medical problems including prior malignancy now presents with diffuse discomfort, cough, congestion, sore throat.  Onset was several days ago.  Patient is on home oxygen  and this level is unchanged, no fever, syncope.  Home Medications Prior to Admission medications   Medication Sig Start Date End Date Taking? Authorizing Provider  amoxicillin -clavulanate (AUGMENTIN ) 875-125 MG tablet Take 1 tablet by mouth every 12 (twelve) hours. 02/15/23  Yes Garrick Charleston, MD  azithromycin  (ZITHROMAX ) 250 MG tablet Take 1 tablet (250 mg total) by mouth daily for 4 days. Take 1 every day until finished. 02/15/23 02/19/23 Yes Garrick Charleston, MD  albuterol  (VENTOLIN  HFA) 108 [phone] Base) MCG/ACT inhaler INHALE 2 PUFFS BY MOUTH EVERY 6 HOURS AS NEEDED FOR WHEEZING OR SHORTNESS OF BREATH 11/22/22   Shelah Charleston RAMAN, MD  cefUROXime  (CEFTIN ) 250 MG tablet Take Ceftin  twice daily first week of January, April, July,October 12/30/22   Hope Almarie ORN, NP  cholecalciferol  (VITAMIN D3) 25 MCG (1000 UNIT) tablet Take 1 tablet (1,000 Units total) by mouth daily. 06/05/22   Lou Claretta HERO, MD  dicyclomine  (BENTYL ) 10 MG capsule TAKE 1 CAPSULE BY MOUTH 4 TIMES DAILY BEFORE MEAL(S) AND AT BEDTIME 02/03/23   Mansouraty, Aloha Raddle., MD  diphenoxylate -atropine  (LOMOTIL ) 2.5-0.025 MG tablet Take 1 tablet by mouth 3 (three) times daily as needed for diarrhea or loose stools. 09/03/22   Mansouraty, Aloha Raddle., MD  doxycycline  (VIBRA -TABS) 100 MG tablet Take Doxycycline  twice daily first week of December, March, June and September 12/30/22   Hope Almarie ORN, NP  escitalopram  (LEXAPRO ) 10 MG tablet Take 1 tablet (10 mg total) by mouth daily.  01/20/23 01/20/24  Francella Rogue, MD  fluticasone  (FLONASE ) 50 MCG/ACT nasal spray Use 2 spray(s) in each nostril once daily 07/09/22   Jinwala, Sagar H, MD  Fluticasone -Umeclidin-Vilant (TRELEGY ELLIPTA ) 200-62.5-25 MCG/ACT AEPB INHALE 1 PUFF INTO LUNGS ONCE DAILY 12/26/22   Shelah Charleston RAMAN, MD  ipratropium-albuterol  (DUONEB) 0.5-2.5 (3) MG/3ML SOLN Take 3 mLs by nebulization every 6 (six) hours as needed. 12/30/22   Hope Almarie ORN, NP  isosorbide  mononitrate (IMDUR ) 60 MG 24 hr tablet Take 1 tablet (60 mg total) by mouth daily. 07/29/22 10/27/22  Daneen Damien BROCKS, NP  lidocaine  (LIDODERM ) 5 % Place 1 patch unto chest wall. Remove & Discard patch within 12 hours. 07/21/22   Lou Claretta HERO, MD  losartan  (COZAAR ) 25 MG tablet Take 1 tablet (25 mg total) by mouth daily. 10/28/22   Vonna Sharlet POUR, MD  mesalamine  (LIALDA ) 1.2 g EC tablet Take 4 tablets (4.8 g total) by mouth daily with breakfast. 07/29/22   Craig Alan SAUNDERS, PA-C  MYRBETRIQ  50 MG TB24 tablet Take 1 tablet (50 mg total) by mouth daily. 06/04/22   Lou Claretta HERO, MD  nitroGLYCERIN  (NITROSTAT ) 0.4 MG SL tablet Place 1 tablet (0.4 mg total) under the tongue every 5 (five) minutes as needed for chest pain. 08/19/21 08/20/22  Daneen Damien BROCKS, NP  omeprazole  (PRILOSEC) 40 MG capsule Take 1 capsule (40 mg total) by mouth daily. 11/05/22   Tawkaliyar, Roya, DO  ondansetron  (ZOFRAN ) 4 MG tablet Take 1 tablet (4 mg total) by mouth every 8 (  eight) hours as needed for nausea or vomiting. 04/14/22   Hope Almarie ORN, NP  OXYGEN  Inhale 2 L into the lungs continuous.    [provider]  predniSONE  (DELTASONE ) 20 MG tablet Take 1 tablet (20 mg total) by mouth daily with breakfast. 12/30/22   Hope Almarie ORN, NP  rosuvastatin  (CRESTOR ) 20 MG tablet Take 20 mg by mouth daily.    [provider]  Simethicone  125 MG TABS Take 1 tablet (125 mg total) by mouth 3 (three) times daily as needed. 05/18/22   Lou Claretta HERO, MD  traMADol   (ULTRAM ) 50 MG tablet Take 50 mg by mouth every 8 (eight) hours as needed for moderate pain. 07/31/22   [provider]  XIFAXAN  550 MG TABS tablet TAKE 1 TABLET BY MOUTH THREE TIMES DAILY 11/21/22   Mansouraty, Gabriel Jr., MD  zonisamide  (ZONEGRAN ) 25 MG capsule Take 1 capsule (25 mg total) by mouth at bedtime. 01/20/23   Francella Rogue, MD      Allergies    Patient has no known allergies.    Review of Systems   Review of Systems  Physical Exam Updated Vital Signs BP 100/72 (BP Location: Right Arm)   Pulse 77   Temp 98.8 F (37.1 C) (Oral)   Resp 18   Ht 5' 9 (1.753 m)   Wt 68.5 kg   SpO2 94%   BMI 22.30 kg/m  Physical Exam Vitals and nursing note reviewed.  Constitutional:      General: He is not in acute distress.    Appearance: He is well-developed.  HENT:     Head: Normocephalic and atraumatic.  Eyes:     Conjunctiva/sclera: Conjunctivae normal.  Cardiovascular:     Rate and Rhythm: Normal rate and regular rhythm.  Pulmonary:     Effort: Pulmonary effort is normal. No respiratory distress.     Breath sounds: No stridor.  Abdominal:     General: There is no distension.     Tenderness: There is no abdominal tenderness. There is no rebound.  Skin:    General: Skin is warm and dry.  Neurological:     Mental Status: He is alert and oriented to person, place, and time.     ED Results / Procedures / Treatments   Labs (all labs ordered are listed, but only abnormal results are displayed) Labs Reviewed  RESP PANEL BY RT-PCR (RSV, FLU A&B, COVID)  RVPGX2 - Abnormal; Notable for the following components:      Result Value   Resp Syncytial Virus by PCR POSITIVE (*)    All other components within normal limits  COMPREHENSIVE METABOLIC PANEL - Abnormal; Notable for the following components:   Sodium 133 (*)    All other components within normal limits  CBC WITH DIFFERENTIAL/PLATELET - Abnormal; Notable for the following components:   RBC 4.08 (*)     Hemoglobin 12.6 (*)    HCT 38.6 (*)    Monocytes Absolute 1.2 (*)    All other components within normal limits  LIPASE, BLOOD  BRAIN NATRIURETIC PEPTIDE  ETHANOL  URINALYSIS, ROUTINE W REFLEX MICROSCOPIC  TROPONIN I (HIGH SENSITIVITY)  TROPONIN I (HIGH SENSITIVITY)    EKG EKG Interpretation Date/Time:  Sunday February 15 2023 15:17:32 EST Ventricular Rate:  67 PR Interval:  161 QRS Duration:  96 QT Interval:  399 QTC Calculation: 422 R Axis:   -75  Text Interpretation: Sinus rhythm Atrial premature complex Left anterior fascicular block Confirmed by Garrick Charleston [phone]) on  02/15/2023 4:37:25 PM  Radiology DG Chest 2 View Result Date: 02/15/2023 CLINICAL DATA:  Chest pain, sore throat.  Lung cancer EXAM: CHEST - 2 VIEW COMPARISON:  PET-CT 01/09/2023 FINDINGS: Bilateral upper lung nodules similar to 11/17/2022 chest radiograph. Atherosclerotic calcification of the aortic arch. Mildly prominent main pulmonary artery, cannot exclude pulmonary arterial hypertension. Emphysema noted. Mild scarring or atelectasis at the left lung base. Heart size within normal limits. Lower cervical plate and screw fixator. Mild thoracic spondylosis. Postoperative findings from left upper lobectomy. On the lateral projection, there is hazy density projecting in the posterior basal segments of the lower lobes. There is also some accentuated density along the left diaphragm, the combination of findings raise the possibility of early left lower lobe airspace opacity. IMPRESSION: 1. Hazy density projecting in the posterior basal segments of the lower lobes on the lateral projection, suspicious for early left lower lobe airspace opacity which may reflect pneumonia. 2. Bilateral upper lung nodules similar to 11/17/2022 chest radiograph. 3. Emphysema. 4. Mildly prominent main pulmonary artery, cannot exclude pulmonary arterial hypertension. 5. Postoperative findings from left upper lobectomy. 6. Aortic Atherosclerosis  (ICD10-I70.0). Electronically Signed   By: Ryan Salvage M.D.   On: 02/15/2023 16:04    Procedures Procedures    Medications Ordered in ED Medications  amoxicillin -clavulanate (AUGMENTIN ) 875-125 MG per tablet 1 tablet (has no administration in time range)  azithromycin  (ZITHROMAX ) tablet 500 mg (has no administration in time range)  sodium chloride  0.9 % bolus 1,000 mL (1,000 mLs Intravenous New Bag/Given 02/15/23 1521)  LORazepam  (ATIVAN ) injection 1 mg (1 mg Intravenous Given 02/15/23 1522)    ED Course/ Medical Decision Making/ A&P                                 Medical Decision Making Elderly male with history of COPD, multiple malignancies presents with multiple complaints, including cough, congestion, intermittent abdominal pain, broad differential given his history including pneumonia, bacteremia, sepsis, viral syndrome, recurrence of his malignancy. Cardiac 75 sinus normal line pulse ox 90% 2 L nasal cannula no change from baseline but abnormal  Amount and/or Complexity of Data Reviewed External Data Reviewed: notes. Labs: ordered. Decision-making details documented in ED Course. Radiology: ordered and independent interpretation performed. Decision-making details documented in ED Course. ECG/medicine tests: ordered and independent interpretation performed. Decision-making details documented in ED Course.  Risk Prescription drug management.   4:38 PM Patient in no distress, no change in ox requirement, no hemodynamic instability, notes for bacteremia, sepsis.  Patient with RSV positive test result, possible complication of bacterial pneumonia, but no evidence for bacteremia, sepsis, distress, hemodynamic instability suggesting need for admission.  Patient started on antibiotics here will follow-up tomorrow with primary care to arrange appropriate ongoing care.        Final Clinical Impression(s) / ED Diagnoses Final diagnoses:  RSV (acute bronchiolitis due to  respiratory syncytial virus)  Community acquired pneumonia of left lower lobe of lung    Rx / DC Orders ED Discharge Orders          Ordered    azithromycin  (ZITHROMAX ) 250 MG tablet  Daily        02/15/23 1635    amoxicillin -clavulanate (AUGMENTIN ) 875-125 MG tablet  Every 12 hours        02/15/23 1635              Garrick Charleston, MD 02/15/23 1638

## 2023-02-16 ENCOUNTER — Telehealth: Payer: Self-pay

## 2023-02-16 NOTE — Transitions of Care (Post Inpatient/ED Visit) (Signed)
 02/16/2023  Name: [name] MRN: [id number] DOB: 1955/05/18  Today's TOC FU Call Status: Today's TOC FU Call Status:: Successful TOC FU Call Completed TOC FU Call Complete Date: 02/16/23 Patient's Name and Date of Birth confirmed.  Transition Care Management Follow-up Telephone Call Date of Discharge: 02/15/23 Discharge Facility: [facility]) Type of Discharge: Emergency Department Reason for ED Visit: Other:, Respiratory Respiratory Diagnosis:  (RSV) How have you been since you were released from the hospital?: Same Any questions or concerns?: No  Items Reviewed: Did you receive and understand the discharge instructions provided?: Yes Medications obtained,verified, and reconciled?: Yes (Medications Reviewed) Any new allergies since your discharge?: No Dietary orders reviewed?: NA Do you have support at home?: Yes  Medications Reviewed Today: Medications Reviewed Today     Reviewed by Lang Avelina PARAS, CMA (Certified Medical Assistant) on 02/16/23 at 1507  Med List Status: <None>   Medication Order Taking? Sig Documenting Provider Last Dose Status Informant  albuterol  (VENTOLIN  HFA) 108 (90 Base) MCG/ACT inhaler [id number] No INHALE 2 PUFFS BY MOUTH EVERY 6 HOURS AS NEEDED FOR WHEEZING OR SHORTNESS OF BREATH Byrum, Lamar RAMAN, MD Taking Active   amoxicillin -clavulanate (AUGMENTIN ) 875-125 MG tablet [id number]  Take 1 tablet by mouth every 12 (twelve) hours. Garrick Lamar, MD  Active   azithromycin  (ZITHROMAX ) 250 MG tablet [id number]  Take 1 tablet (250 mg total) by mouth daily for 4 days. Take 1 every day until finished. Garrick Lamar, MD  Active   cefUROXime  (CEFTIN ) 250 MG tablet [id number]  Take Ceftin  twice daily first week of January, April, July,October Walsh, Elizabeth W, NP  Active   cholecalciferol  (VITAMIN D3) 25 MCG (1000 UNIT) tablet [id number] No Take 1 tablet (1,000 Units total) by mouth daily. Lou Claretta HERO, MD Taking Active Self, Pharmacy  Records  dicyclomine  (BENTYL ) 10 MG capsule [id number]  TAKE 1 CAPSULE BY MOUTH 4 TIMES DAILY BEFORE MEAL(S) AND AT BEDTIME Mansouraty, Aloha Raddle., MD  Active   diphenoxylate -atropine  (LOMOTIL ) 2.5-0.025 MG tablet [id number] No Take 1 tablet by mouth 3 (three) times daily as needed for diarrhea or loose stools. Mansouraty, Aloha Raddle., MD Taking Active   doxycycline  (VIBRA -TABS) 100 MG tablet [id number]  Take Doxycycline  twice daily first week of December, March, June and September Hope Almarie ORN, NP  Active   escitalopram  (LEXAPRO ) 10 MG tablet [id number]  Take 1 tablet (10 mg total) by mouth daily. Francella Rogue, MD  Active   fluticasone  (FLONASE ) 50 MCG/ACT nasal spray [id number] No Use 2 spray(s) in each nostril once daily Jinwala, Sagar H, MD Taking Active Self, Pharmacy Records  Fluticasone -Umeclidin-Vilant (TRELEGY ELLIPTA ) 200-62.5-25 MCG/ACT AEPB [id number] No INHALE 1 PUFF INTO LUNGS ONCE DAILY Byrum, Lamar RAMAN, MD Taking Active   ipratropium-albuterol  (DUONEB) 0.5-2.5 (3) MG/3ML SOLN [id number]  Take 3 mLs by nebulization every 6 (six) hours as needed. Hope Almarie ORN, NP  Active   isosorbide  mononitrate (IMDUR ) 60 MG 24 hr tablet [id number] No Take 1 tablet (60 mg total) by mouth daily. Daneen Damien BROCKS, NP Taking Expired 10/27/22 2359 Self, Pharmacy Records  lidocaine  (LIDODERM ) 5 % [id number] No Place 1 patch unto chest wall. Remove & Discard patch within 12 hours. Lou Claretta HERO, MD Taking Active Self, Pharmacy Records  losartan  (COZAAR ) 25 MG tablet [id number] No Take 1 tablet (25 mg total) by mouth daily. Vonna Sharlet POUR, MD Taking Active   mesalamine  (LIALDA ) 1.2 g EC tablet [id number] No Take 4 tablets (4.8 g total) by mouth daily with breakfast.  Craig Alan SAUNDERS, PA-C Taking Active Self, Pharmacy Records           Med Note ZENA, [place] A   Thu Aug 28, 2022  1:07 PM) 2am, 2pm  MYRBETRIQ  50 MG TB24 tablet [id number] No Take 1 tablet (50 mg total) by mouth daily. Lou Claretta HERO, MD Taking Active Self, Pharmacy Records  nitroGLYCERIN  (NITROSTAT ) 0.4 MG SL tablet [id number] No Place 1 tablet (0.4 mg total) under the tongue every 5 (five) minutes as needed for chest pain. Daneen Damien BROCKS, NP 08/20/2022 Expired 08/20/22 2359 Self, Pharmacy Records  omeprazole  (PRILOSEC) 40 MG capsule [id number] No Take 1 capsule (40 mg total) by mouth daily. Heddy Barren, DO Taking Active   ondansetron  (ZOFRAN ) 4 MG tablet [id number] No Take 1 tablet (4 mg total) by mouth every 8 (eight) hours as needed for nausea or vomiting. Hope Almarie ORN, NP Taking Active Self, Pharmacy Records  OXYGEN  [id number] No Inhale 2 L into the lungs continuous. [provider] Taking Active Self, Pharmacy Records  predniSONE  (DELTASONE ) 20 MG tablet [id number]  Take 1 tablet (20 mg total) by mouth daily with breakfast. Hope Almarie ORN, NP  Active   rosuvastatin  (CRESTOR ) 20 MG tablet [id number] No Take 20 mg by mouth daily. [provider] Taking Active   Simethicone  125 MG TABS [id number] No Take 1 tablet (125 mg total) by mouth 3 (three) times daily as needed. Lou Claretta HERO, MD Taking Active Self, Pharmacy Records  traMADol  (ULTRAM ) 50 MG tablet [id number] No Take 50 mg by mouth every 8 (eight) hours as needed for moderate pain. [provider] Taking Active Self, Pharmacy Records  XIFAXAN  550 MG TABS tablet [id number] No TAKE 1 TABLET BY MOUTH THREE TIMES DAILY Mansouraty, Gabriel Jr., MD Taking Active   zonisamide  (ZONEGRAN ) 25 MG capsule [id number]  Take 1 capsule (25 mg total) by mouth at bedtime. Francella Rogue, MD  Active             Home Care and Equipment/Supplies: Were Home Health Services Ordered?: NA Any new equipment or medical supplies ordered?: NA  Functional Questionnaire: Do you need assistance with bathing/showering or dressing?: No Do you need assistance with meal preparation?: No Do you need assistance with eating?: No Do you have difficulty maintaining  continence: No Do you need assistance with getting out of bed/getting out of a chair/moving?: No Do you have difficulty managing or taking your medications?: No  Follow up appointments reviewed: PCP Follow-up appointment confirmed?: Yes Date of PCP follow-up appointment?: 02/17/23 Follow-up Provider: Gabino Boga, MD [facility] Follow-up appointment confirmed?: NA Do you need transportation to your follow-up appointment?: No Do you understand care options if your condition(s) worsen?: Yes-patient verbalized understanding    SIGNATURE Avelina Essex, CMA (AAMA)  [facility] [phone]

## 2023-02-17 ENCOUNTER — Ambulatory Visit: Payer: 59 | Admitting: Student

## 2023-02-17 VITALS — BP 102/71 | HR 69 | Temp 98.0°F | Ht 69.0 in | Wt 159.2 lb

## 2023-02-17 DIAGNOSIS — J210 Acute bronchiolitis due to respiratory syncytial virus: Secondary | ICD-10-CM | POA: Diagnosis not present

## 2023-02-17 DIAGNOSIS — L209 Atopic dermatitis, unspecified: Secondary | ICD-10-CM

## 2023-02-17 DIAGNOSIS — F419 Anxiety disorder, unspecified: Secondary | ICD-10-CM | POA: Diagnosis not present

## 2023-02-17 DIAGNOSIS — R519 Headache, unspecified: Secondary | ICD-10-CM

## 2023-02-17 DIAGNOSIS — L219 Seborrheic dermatitis, unspecified: Secondary | ICD-10-CM | POA: Insufficient documentation

## 2023-02-17 DIAGNOSIS — F32A Depression, unspecified: Secondary | ICD-10-CM | POA: Diagnosis not present

## 2023-02-17 DIAGNOSIS — G8929 Other chronic pain: Secondary | ICD-10-CM

## 2023-02-17 DIAGNOSIS — Z79899 Other long term (current) drug therapy: Secondary | ICD-10-CM

## 2023-02-17 MED ORDER — HYDROCORTISONE 1 % EX CREA: TOPICAL_CREAM | CUTANEOUS | 1 refills | Status: AC

## 2023-02-17 MED ORDER — ESCITALOPRAM OXALATE 20 MG PO TABS: 20.0000 mg | ORAL_TABLET | Freq: Every day | ORAL | 2 refills | Status: DC

## 2023-02-17 MED ORDER — KETOCONAZOLE 2 % EX SHAM: 1.0000 | MEDICATED_SHAMPOO | CUTANEOUS | 0 refills | Status: DC
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
## 2023-02-17 NOTE — Assessment & Plan Note (Signed)
 Patient endorsing some dry skin and itchiness on the dorsal surface of the hand as well as the hairline.  On examination there are signs of what appears to be seborrheic dermatitis as well as eczema.  Will try Nizoral  2% shampoo as well as hydrocortisone  1% cream Plan: Nizoral  2% shampoo Hydrocortisone  1% cream

## 2023-02-17 NOTE — Assessment & Plan Note (Signed)
 Patient follows with neurology for headache.  Last visit Zonegran was dose reduced from 50 mg daily 25 mg daily due to grogginess in the a.m.  Patient states that this helped a bit but continues to have some grogginess.

## 2023-02-17 NOTE — Assessment & Plan Note (Addendum)
 Patient continues to endorse anxiety especially regarding his workup/treatment for cancer. PHQ 9 today without improvement from prior.  Patient continues to endorse anxiety being poorly controlled. Plan: Dose increase Lexapro  from 10 mg to 20 mg daily If patient has insomnia on increased dose, consider resuming 10 mg dose with addition of mirtazapine or trazodone 

## 2023-02-17 NOTE — Assessment & Plan Note (Signed)
 Patient recently seen in emergency department where he was diagnosed with RSV, as well as possible pneumonia.  He was started on Augmentin  and azithromycin .  He is still finishing his antibiotic regimen.  His lung sounds clear to auscultation, and he is saturating well on his home 2 L oxygen .  He does endorse some subjective shortness of breath, cough, sputum production, but denies hemoptysis, hematochezia, melena, abdominal pain, nausea, or vomiting.  I discussed with him today regarding his diagnosis, anticipatory guidance, return precautions.

## 2023-02-17 NOTE — Patient Instructions (Addendum)
 Thank you, [name] for allowing us  to provide your care today.      I have ordered the following medication/changed the following medications:   Stop the following medications: Medications Discontinued During This Encounter  Medication Reason   escitalopram  (LEXAPRO ) 10 MG tablet      Start the following medications: Meds ordered this encounter  Medications   escitalopram  (LEXAPRO ) 20 MG tablet    Sig: Take 1 tablet (20 mg total) by mouth daily.    Dispense:  30 tablet    Refill:  2   ketoconazole  (NIZORAL ) 2 % shampoo    Sig: Apply 1 Application topically 2 (two) times a week.    Dispense:  120 mL    Refill:  0   hydrocortisone  cream 1 %    Sig: Apply to affected area 2 times daily    Dispense:  30 g    Refill:  1      Follow up: 2 months for routine follow up    We look forward to seeing you next time. Please call our clinic at [phone] if you have any questions or concerns. The best time to call is Monday-Friday from 9am-4pm, but there is someone available 24/7. If after hours or the weekend, call the main hospital number and ask for the Internal Medicine Resident On-Call. If you need medication refills, please notify your pharmacy one week in advance and they will send us  a request.   Thank you for trusting me with your care. Wishing you the best!  Trilby Europe MD [facility]

## 2023-02-19 NOTE — Progress Notes (Signed)
 Internal Medicine Clinic Attending  Case discussed with the resident at the time of the visit.  We reviewed the resident's history and exam and pertinent patient test results.  I agree with the assessment, diagnosis, and plan of care documented in the resident's note.

## 2023-02-19 NOTE — Addendum Note (Signed)
Addended by: Burnell Blanks on: 02/19/2023 01:35 PM   Modules accepted: Level of Service

## 2023-02-23 ENCOUNTER — Telehealth: Payer: Self-pay | Admitting: Gastroenterology

## 2023-02-23 ENCOUNTER — Other Ambulatory Visit: Payer: Self-pay | Admitting: Gastroenterology

## 2023-02-23 NOTE — Telephone Encounter (Signed)
Patient called wanting to reschedule endoscopy done at the hospital. Patient is requesting a refill for Bentyl. Thanks

## 2023-02-23 NOTE — Telephone Encounter (Signed)
Follow up appt has been made for 03/31/23 at 830 am.   The pt has been advised of the refill and appt.

## 2023-02-25 DIAGNOSIS — J441 Chronic obstructive pulmonary disease with (acute) exacerbation: Secondary | ICD-10-CM | POA: Diagnosis not present

## 2023-02-27 ENCOUNTER — Other Ambulatory Visit: Payer: Self-pay | Admitting: Medical Oncology

## 2023-02-27 DIAGNOSIS — C3490 Malignant neoplasm of unspecified part of unspecified bronchus or lung: Secondary | ICD-10-CM

## 2023-03-02 ENCOUNTER — Inpatient Hospital Stay (HOSPITAL_BASED_OUTPATIENT_CLINIC_OR_DEPARTMENT_OTHER): Payer: 59 | Admitting: Internal Medicine

## 2023-03-02 ENCOUNTER — Inpatient Hospital Stay: Payer: 59 | Attending: Internal Medicine

## 2023-03-02 VITALS — BP 136/85 | HR 60 | Temp 97.2°F | Resp 17 | Ht 69.0 in | Wt 158.0 lb

## 2023-03-02 DIAGNOSIS — Z85118 Personal history of other malignant neoplasm of bronchus and lung: Secondary | ICD-10-CM

## 2023-03-02 DIAGNOSIS — C3410 Malignant neoplasm of upper lobe, unspecified bronchus or lung: Secondary | ICD-10-CM

## 2023-03-02 DIAGNOSIS — C3490 Malignant neoplasm of unspecified part of unspecified bronchus or lung: Secondary | ICD-10-CM

## 2023-03-02 DIAGNOSIS — R0602 Shortness of breath: Secondary | ICD-10-CM

## 2023-03-02 DIAGNOSIS — R918 Other nonspecific abnormal finding of lung field: Secondary | ICD-10-CM | POA: Diagnosis not present

## 2023-03-02 DIAGNOSIS — Z905 Acquired absence of kidney: Secondary | ICD-10-CM | POA: Diagnosis not present

## 2023-03-02 DIAGNOSIS — Z9981 Dependence on supplemental oxygen: Secondary | ICD-10-CM | POA: Insufficient documentation

## 2023-03-02 DIAGNOSIS — R110 Nausea: Secondary | ICD-10-CM | POA: Insufficient documentation

## 2023-03-02 DIAGNOSIS — C642 Malignant neoplasm of left kidney, except renal pelvis: Secondary | ICD-10-CM | POA: Diagnosis not present

## 2023-03-02 LAB — CBC WITH DIFFERENTIAL (CANCER CENTER ONLY)
Abs Immature Granulocytes: 0.06 10*3/uL (ref 0.00–0.07)
Basophils Absolute: 0.1 10*3/uL (ref 0.0–0.1)
Basophils Relative: 1 %
Eosinophils Absolute: 0.8 10*3/uL — ABNORMAL HIGH (ref 0.0–0.5)
Eosinophils Relative: 10 %
HCT: 38.3 % — ABNORMAL LOW (ref 39.0–52.0)
Hemoglobin: 12.1 g/dL — ABNORMAL LOW (ref 13.0–17.0)
Immature Granulocytes: 1 %
Lymphocytes Relative: 18 %
Lymphs Abs: 1.4 10*3/uL (ref 0.7–4.0)
MCH: 30.3 pg (ref 26.0–34.0)
MCHC: 31.6 g/dL (ref 30.0–36.0)
MCV: 96 fL (ref 80.0–100.0)
Monocytes Absolute: 0.8 10*3/uL (ref 0.1–1.0)
Monocytes Relative: 11 %
Neutro Abs: 4.7 10*3/uL (ref 1.7–7.7)
Neutrophils Relative %: 59 %
Platelet Count: 370 10*3/uL (ref 150–400)
RBC: 3.99 MIL/uL — ABNORMAL LOW (ref 4.22–5.81)
RDW: 14.4 % (ref 11.5–15.5)
WBC Count: 7.9 10*3/uL (ref 4.0–10.5)
nRBC: 0 % (ref 0.0–0.2)

## 2023-03-02 LAB — CMP (CANCER CENTER ONLY)
ALT: 9 U/L (ref 0–44)
AST: 16 U/L (ref 15–41)
Albumin: 3.8 g/dL (ref 3.5–5.0)
Alkaline Phosphatase: 96 U/L (ref 38–126)
Anion gap: 5 (ref 5–15)
BUN: 9 mg/dL (ref 8–23)
CO2: 28 mmol/L (ref 22–32)
Calcium: 9.5 mg/dL (ref 8.9–10.3)
Chloride: 104 mmol/L (ref 98–111)
Creatinine: 1.03 mg/dL (ref 0.61–1.24)
GFR, Estimated: >60 mL/min (ref >60)
Glucose, Bld: 84 mg/dL (ref 70–99)
Potassium: 4.3 mmol/L (ref 3.5–5.1)
Sodium: 137 mmol/L (ref 135–145)
Total Bilirubin: 0.3 mg/dL (ref 0.0–1.2)
Total Protein: 7.3 g/dL (ref 6.5–8.1)
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
## 2023-03-03 DIAGNOSIS — R3915 Urgency of urination: Secondary | ICD-10-CM | POA: Diagnosis not present

## 2023-03-03 DIAGNOSIS — R350 Frequency of micturition: Secondary | ICD-10-CM | POA: Diagnosis not present

## 2023-03-04 ENCOUNTER — Other Ambulatory Visit: Payer: Self-pay

## 2023-03-04 ENCOUNTER — Ambulatory Visit: Payer: 59 | Admitting: Emergency Medicine

## 2023-03-04 ENCOUNTER — Encounter: Payer: Self-pay | Admitting: Emergency Medicine

## 2023-03-04 VITALS — BP 120/76 | HR 52 | Ht 69.0 in | Wt 157.8 lb

## 2023-03-04 DIAGNOSIS — R9389 Abnormal findings on diagnostic imaging of other specified body structures: Secondary | ICD-10-CM

## 2023-03-04 DIAGNOSIS — J439 Emphysema, unspecified: Secondary | ICD-10-CM | POA: Diagnosis not present

## 2023-03-04 DIAGNOSIS — C3490 Malignant neoplasm of unspecified part of unspecified bronchus or lung: Secondary | ICD-10-CM | POA: Diagnosis not present

## 2023-03-04 MED ORDER — FLUTICASONE PROPIONATE 50 MCG/ACT NA SUSP: NASAL | 3 refills | Status: AC

## 2023-03-04 MED ORDER — PREDNISONE 10 MG PO TABS: 10.0000 mg | ORAL_TABLET | Freq: Every day | ORAL | 5 refills | Status: DC

## 2023-03-04 NOTE — Progress Notes (Signed)
Subjective:    Patient ID: [name], male    DOB: 09-13-1955, 68 y.o.   MRN: [id number]  COPD He complains of cough and shortness of breath. There is no wheezing. Pertinent negatives include no ear pain, fever, headaches, postnasal drip, rhinorrhea, sneezing, sore throat or trouble swallowing. His past medical history is significant for COPD.    ROV 03/04/2023 --67 year old male with very severe COPD and bilateral surgical resection for adenocarcinoma, associated chronic bronchitic symptoms and chronic hypoxemic respiratory failure.  Had been managed on Trelegy, rotating antibiotics, chronic prednisone.  Most recent imaging CT chest 12/2022 and also PET scan 01/09/2023 as below.  He was in the ED 02/15/2023 with wheezing, cough, dyspnea.  Diagnosed with RSV with a possible superimposed CAP.  He was treated with azithromycin and Augmentin.  Since I last saw him he has engaged with palliative care support. He feels better than 3 wks ago, still some cough but sputum burden. He tells me that he stopped taking his prednisone about 2 weeks ago. He is contemplating whether he would want therapy for lung cancer if he has it - states that he is unsure. He is still having a lot of trouble with diarrhea, upset stomach.   PET scan 01/09/2023 reviewed by me showed various waxing waning pulmonary nodules, some previous ones which have resolved or gotten smaller, some new ones with some hypermetabolism.  No evidence of any distant disease.                                                                                                                                            Review of Systems  Constitutional:  Negative for fever and unexpected weight change.  HENT:  Positive for congestion. Negative for dental problem, ear pain, nosebleeds, postnasal drip, rhinorrhea, sinus pressure, sneezing, sore throat and trouble swallowing.   Eyes:  Negative for redness and itching.  Respiratory:  Positive for cough  and shortness of breath. Negative for chest tightness and wheezing.   Cardiovascular:  Positive for palpitations. Negative for leg swelling.  Gastrointestinal:  Negative for nausea and vomiting.  Genitourinary:  Negative for dysuria.  Musculoskeletal:  Negative for joint swelling.  Skin:  Negative for rash.  Neurological:  Negative for headaches.  Hematological:  Does not bruise/bleed easily.  Psychiatric/Behavioral:  Positive for dysphoric mood. The patient is nervous/anxious.        Objective:   Physical Exam  Vitals:   03/04/23 1308  BP: 120/76  Pulse: (!) 52  SpO2: 93%  Weight: 157 lb 12.8 oz (71.6 kg)  Height: 5\' 9"  (1.753 m)    Gen: Pleasant, well-nourished, in no distress, depressed affect  ENT: No lesions,  mouth clear,  oropharynx clear, no postnasal drip, evolving some cushingoid facies  Neck: No JVD, no stridor  Lungs: No use of accessory muscles, very distant, overall clear, some mild  wheeze on forced expiration  Cardiovascular: RRR, heart sounds normal, no murmur or gallops, trace peripheral edema  Musculoskeletal: no deformities   Neuro: alert, non focal  Skin: Warm, no lesions or rashes     Assessment & Plan:  Lung cancer (HCC) With bilateral pulmonary nodules.  He has had waxing waning nodularity on his serial scans.  He is high risk for malignancy.  Has already seen Dr. Arbutus Ped regarding his current nodules and most recent PET scan.  Discussed in detail with him the risks and benefits of bronchoscopy.  He is very high risk for general anesthesia.  We will plan to repeat his CT chest in early March.  Assess for any resolution of the existing nodules.  If they persist then we will discuss next steps either bronchoscopy, watchful waiting.  He may be a candidate for empiric SBRT if an isolated nodule grows.  Bronchoscopy would be the most straightforward approach to give [place] enough data, but also carries a most risk.  COPD with emphysema (HCC) Please restart  prednisone 10 mg once daily (down from 20 mg).  Keep track of how this medication helps your breathing or whether you get side effects from it so we can discuss. Continue rotating antibiotics for the first week of each month: Cefuroxime, doxycycline, azithromycin. Continue your inhaled medication as you have been taking it Continue your oxygen at all times Continue to follow with palliative care      Levy Pupa, MD, PhD 03/04/2023, 1:34 PM [facility] [phone] or if no answer [phone]

## 2023-03-04 NOTE — Assessment & Plan Note (Signed)
With bilateral pulmonary nodules.  He has had waxing waning nodularity on his serial scans.  He is high risk for malignancy.  Has already seen Dr. Arbutus Ped regarding his current nodules and most recent PET scan.  Discussed in detail with him the risks and benefits of bronchoscopy.  He is very high risk for general anesthesia.  We will plan to repeat his CT chest in early March.  Assess for any resolution of the existing nodules.  If they persist then we will discuss next steps either bronchoscopy, watchful waiting.  He may be a candidate for empiric SBRT if an isolated nodule grows.  Bronchoscopy would be the most straightforward approach to give [place] enough data, but also carries a most risk.

## 2023-03-04 NOTE — Assessment & Plan Note (Signed)
Please restart prednisone 10 mg once daily (down from 20 mg).  Keep track of how this medication helps your breathing or whether you get side effects from it so we can discuss. Continue rotating antibiotics for the first week of each month: Cefuroxime, doxycycline, azithromycin. Continue your inhaled medication as you have been taking it Continue your oxygen at all times Continue to follow with palliative care

## 2023-03-04 NOTE — Patient Instructions (Addendum)
Please restart prednisone 10 mg once daily (down from 20 mg).  Keep track of how this medication helps your breathing or whether you get side effects from it so we can discuss. Continue rotating antibiotics for the first week of each month: Cefuroxime, doxycycline, azithromycin. Continue your inhaled medication as you have been taking it We will repeat your CT scan of the chest in early March.  Based on that scan we will decide whether to pursue bronchoscopy to evaluate pulmonary nodules.  We do have to consider the fact that your severe COPD puts you at high risk for a procedure for biopsy. Continue your oxygen at all times Continue to follow with palliative care Follow-up with Dr. Delton Coombes in March after your CT chest so we can review those results together

## 2023-03-05 ENCOUNTER — Ambulatory Visit: Payer: 59 | Admitting: Licensed Clinical Social Worker

## 2023-03-05 DIAGNOSIS — F32A Depression, unspecified: Secondary | ICD-10-CM | POA: Diagnosis not present

## 2023-03-05 DIAGNOSIS — F419 Anxiety disorder, unspecified: Secondary | ICD-10-CM

## 2023-03-05 NOTE — Patient Instructions (Signed)
[name]

## 2023-03-05 NOTE — BH Specialist Note (Signed)
Integrated Behavioral Health via In Person  03/05/2023 RABON SCHOLLE [id number]  Number of Integrated Behavioral Health Clinician visits: Additional Visit  Session Start time: 1430   Session End time: 1530  Total time in minutes: 60   Referring Provider: PCP  Types of Service: Individual psychotherapy     Presenting Concerns: Patient and/or family reports the following symptoms/concerns: LCSW-A conducted in person visit on today. The patient expressed confusion regarding their current medication regimen, seeking clarification on which medications they should be taking. They are concerned about adhering to the correct regimen and require guidance to ensure proper management. Additionally, the patient emphasized the importance of continuity of care due to their complex medical needs. They prefer to see the same healthcare provider consistently to maintain a cohesive treatment plan and foster a trusting relationship.[facility] sent PCP a message and Nurse triage.   The patient also discussed their recent recovery from RSV and expressed concerns about their overall health status. They are currently recovering and are interested in optimizing their care plan to prevent future complications. This recovery process highlights the need for a well-coordinated and consistent approach to their healthcare.  In response to the patient's concerns, a comprehensive review of their medications will be conducted to clarify their regimen and ensure they are taking the correct medications. Efforts will be made to ensure that the patient sees the same healthcare provider for future appointments to maintain consistency in their care plan. A follow-up appointment will be scheduled to assess the patient's progress and adjust their treatment plan as necessary.   Patient and/or Family's Strengths/Protective Factors: Social connections  Goals Addressed: Patient will:  Reduce symptoms of: anxiety and depression     Progress towards Goals: Ongoing  Interventions: Interventions utilized:  CBT Cognitive Behavioral Therapy Standardized Assessments completed: Not Needed  Patient and/or Family Response: Patient agrees to services  Assessment: Patient currently experiencing Depression and Anxiety.   Patient may benefit from OPT .  Plan: Follow up with behavioral health clinician on : 04/01/2023  I discussed the assessment and treatment plan with the patient and/or parent/guardian. They were provided an opportunity to ask questions and all were answered. They agreed with the plan and demonstrated an understanding of the instructions.   They were advised to call back or seek an in-person evaluation if the symptoms worsen or if the condition fails to improve as anticipated.  Christen Butter, MSW, LCSW-A She/Her Behavioral Health Clinician [facility]  Internal Medicine Center Direct Dial:[phone]  Fax [phone] Main Office Phone: [phone] [address] [place] [place], [place] [address] Website: [facility] Internal Medicine [facility]  [place], [place]  

## 2023-03-12 ENCOUNTER — Telehealth: Payer: Self-pay | Admitting: Emergency Medicine

## 2023-03-12 ENCOUNTER — Other Ambulatory Visit: Payer: Self-pay | Admitting: Emergency Medicine

## 2023-03-12 DIAGNOSIS — J432 Centrilobular emphysema: Secondary | ICD-10-CM

## 2023-03-12 MED ORDER — TRELEGY ELLIPTA 200-62.5-25 MCG/ACT IN AEPB: 1.0000 | INHALATION_SPRAY | Freq: Every day | RESPIRATORY_TRACT | 2 refills | Status: DC

## 2023-03-12 NOTE — Telephone Encounter (Signed)
 Patient states needs refill for Trelegy. Patient out of medication. Pharmacy is [facility]. Patient phone number is [phone].

## 2023-03-12 NOTE — Telephone Encounter (Signed)
 Trelegy has been sent to preferred pharmacy.  Pt is aware and voiced his understanding.  Nothing further needed.

## 2023-03-21 ENCOUNTER — Other Ambulatory Visit: Payer: Self-pay | Admitting: Gastroenterology
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
## 2023-03-25 ENCOUNTER — Telehealth: Payer: Self-pay | Admitting: Physician Assistant

## 2023-03-26 ENCOUNTER — Inpatient Hospital Stay: Payer: 59

## 2023-03-26 ENCOUNTER — Inpatient Hospital Stay: Payer: 59 | Admitting: Physician Assistant

## 2023-03-27 ENCOUNTER — Telehealth: Payer: Self-pay | Admitting: *Deleted

## 2023-03-27 NOTE — Telephone Encounter (Signed)
Call from [place], RN with [facility].  [facility] has Home based Palliative Care.  Patient has a lot of medications and there is some confusion by patient as to how and when to take.  Tammy is suggesting a referral for a [facility] Nurse  to come out to help patient with his medications.

## 2023-03-28 DIAGNOSIS — J441 Chronic obstructive pulmonary disease with (acute) exacerbation: Secondary | ICD-10-CM | POA: Diagnosis not present

## 2023-03-31 ENCOUNTER — Ambulatory Visit: Payer: 59 | Admitting: Gastroenterology

## 2023-03-31 ENCOUNTER — Other Ambulatory Visit: Payer: Self-pay | Admitting: Student

## 2023-03-31 DIAGNOSIS — R350 Frequency of micturition: Secondary | ICD-10-CM | POA: Diagnosis not present

## 2023-03-31 DIAGNOSIS — R3915 Urgency of urination: Secondary | ICD-10-CM | POA: Diagnosis not present

## 2023-03-31 NOTE — Telephone Encounter (Signed)
 Patient stated has problems getting up for morning appointments.  Patient rescheduled for 04/02/2023 at 1:45 PM.  Again asked to bring all of his medications.  Patient stated has a lot of meds. Patient was again asked to bring in all of the medications he is taking so that the doctors can review for him.

## 2023-03-31 NOTE — Telephone Encounter (Signed)
 Call to patient given an appointment for 9:15 AM tomorrow to come in to discuss medications.  Patient was asked to bring in all meds .

## 2023-04-01 ENCOUNTER — Ambulatory Visit (INDEPENDENT_AMBULATORY_CARE_PROVIDER_SITE_OTHER): Payer: 59 | Admitting: Licensed Clinical Social Worker

## 2023-04-01 ENCOUNTER — Encounter: Payer: 59 | Admitting: Student

## 2023-04-01 ENCOUNTER — Ambulatory Visit: Payer: 59 | Admitting: Emergency Medicine

## 2023-04-01 DIAGNOSIS — F419 Anxiety disorder, unspecified: Secondary | ICD-10-CM

## 2023-04-01 DIAGNOSIS — F32A Depression, unspecified: Secondary | ICD-10-CM

## 2023-04-01 NOTE — BH Specialist Note (Signed)
 Integrated Behavioral Health via Telemedicine Visit  04/01/2023 [name] [id number]  Number of Integrated Behavioral Health Clinician visits: Additional Visit  Session Start time: 1430   Session End time: 1530  Total time in minutes: 60   Referring Provider: PCP Patient/Family location: Home  [facility] Provider location: Office All persons participating in visit: [facility] and Patient Types of Service: General Behavioral Integrated Care (BHI)  I connected with [name]  via  Telephone and verified that I am speaking with the correct person using two identifiers. Discussed confidentiality: Yes   I discussed the limitations of telemedicine and the availability of in person appointments.  Discussed there is a possibility of technology failure and discussed alternative modes of communication if that failure occurs.  I discussed that engaging in this telemedicine visit, they consent to the provision of behavioral healthcare and the services will be billed under their insurance.  Patient and/or legal guardian expressed understanding and consented to Telemedicine visit: Yes   Presenting Concerns: Patient and/or family reports the following symptoms/concerns: LCSW-A contacted patient and patient discussed needing to have a telehealth call due to multiple appointments. Overall, patient reported everything being " Same ol' thing". Patient spent session discussing health and frustration with home health, finances and individuals who aren't supportive. Therapist and [facility] reflected on situation and [facility] offered coping skills and motivation techniques to keep patients spirits up. [facility] and patient reviewed breathing techniques and strength base practice skills.    Patient and/or Family's Strengths/Protective Factors: Sense of purpose  Goals Addressed: Patient will:  Reduce symptoms of: anxiety and depression   Progress towards Goals: Ongoing  Interventions: Interventions utilized:  CBT  Cognitive Behavioral Therapy Standardized Assessments completed: Not Needed  Patient and/or Family Response: Patient agrees to ongoing therapy services.   Assessment: Patient currently experiencing Depression and anxiety.   Patient may benefit from Ongoing OPT.  Plan: Follow up with behavioral health clinician on : 03/12   I discussed the assessment and treatment plan with the patient and/or parent/guardian. They were provided an opportunity to ask questions and all were answered. They agreed with the plan and demonstrated an understanding of the instructions.   They were advised to call back or seek an in-person evaluation if the symptoms worsen or if the condition fails to improve as anticipated.  Christen Butter, MSW, LCSW-A She/Her Behavioral Health Clinician [facility]  Internal Medicine Center Direct [phone]  Fax [phone] Main Office Phone: [phone] [address] [place] [place], [place] [address] Website: [facility] Internal Medicine [facility]  [place], [place]  [place]

## 2023-04-02 ENCOUNTER — Ambulatory Visit: Payer: 59 | Admitting: Student

## 2023-04-02 VITALS — BP 126/82 | HR 61 | Temp 97.9°F | Ht 69.0 in | Wt 154.0 lb

## 2023-04-02 DIAGNOSIS — J441 Chronic obstructive pulmonary disease with (acute) exacerbation: Secondary | ICD-10-CM | POA: Diagnosis not present

## 2023-04-02 DIAGNOSIS — I10 Essential (primary) hypertension: Secondary | ICD-10-CM

## 2023-04-02 MED ORDER — PREDNISONE 20 MG PO TABS: 40.0000 mg | ORAL_TABLET | Freq: Every day | ORAL | 0 refills | Status: DC

## 2023-04-02 NOTE — Patient Instructions (Signed)
 Thank you, [name] for allowing [place] to provide your care today. Today we discussed we spoke about your shortness of breath and overall deconditioning.  Currently you 5-day course of steroids please take 40 mg daily for 5 days.[name]  He is to be sure to follow-up with your pulmonologist regarding your pulmonary nodules  I have ordered the following labs for you:  Lab Orders  No laboratory test(s) ordered today     Tests ordered today:    Referrals ordered today:   Referral Orders  No referral(s) requested today     I have ordered the following medication/changed the following medications:   Stop the following medications: There are no discontinued medications.   Start the following medications: Meds ordered this encounter  Medications   predniSONE (DELTASONE) 20 MG tablet    Sig: Take 2 tablets (40 mg total) by mouth daily with breakfast.    Dispense:  10 tablet    Refill:  0     Follow up:  1 month     Remember:   Should you have any questions or concerns please call the internal medicine clinic at [phone].    Kathleen Lime, M.D [facility]
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
## 2023-04-02 NOTE — Assessment & Plan Note (Addendum)
 This is a 68 year old male with extensive medical history remarkable for COPD who presented to me due to concerns for feeling tired and drowsy all the time.  Patient reported he has been short of breath even on his home oxygen and also up with the use sputum which he described as bloody.  Per chart review, this patient had pulmonary nodules on CT chest and follows up with oncology and pulmonology.  Scheduled to see pulmonology 04/09/2023.  In the setting of dyspnea and sputum production I suspect that this patient has COPD exacerbation.  He is already on prednisone 10 mg per his oncologist.  I will prescribe 40 mg prednisone for 5 days to treat his exacerbation at this time.  Patient is adequately counseled on the need to follow-up with his pulmonologist on the upcoming appointment.  This patient seems to have a lot of other issues that cannot be addressed at this visit.  I however suspect that most of his symptoms relate to his existing comorbidities.  It is my hope that his physical deconditioning will somewhat improve after pulmonology and oncology comes up with a definite plan for his  lung findings.

## 2023-04-07 ENCOUNTER — Ambulatory Visit: Payer: Self-pay

## 2023-04-07 DIAGNOSIS — Z789 Other specified health status: Secondary | ICD-10-CM

## 2023-04-07 DIAGNOSIS — J441 Chronic obstructive pulmonary disease with (acute) exacerbation: Secondary | ICD-10-CM

## 2023-04-07 DIAGNOSIS — J439 Emphysema, unspecified: Secondary | ICD-10-CM

## 2023-04-07 NOTE — Patient Instructions (Signed)
 Visit Information  Thank you for taking time to visit with me today. Please don't hesitate to contact me if I can be of assistance to you.   Following are the goals we discussed today:   Goals Addressed             This Visit's Progress    Maintain, Monitor and Self-Manage Symptoms of COPD       Patient Goals/Self Care Activities: -Patient/Caregiver will take medications as prescribed   -Patient/Caregiver will attend all scheduled provider appointments -Patient/Caregiver will call pharmacy for medication refills 3-7 days in advance of running out of medications -Patient/Caregiver will call provider office for new concerns or questions  -Patient/Caregiver will focus on medication adherence by taking medications as prescribed   Advised patient to track and manage COPD triggers Advised patient to self assesses COPD action plan zone and make appointment with provider if in the yellow zone for 48 hours without improvement Discussed the importance of adequate rest and management of fatigue with COPD Avoid irritants that could exacerbate your condition Drink fluids Advised him to see physician if symptoms are not better Take you medications Wear a mask when out around others Follow up with pharmacist regarding medications         Our next appointment is by telephone on 05/06/23 at 130 pm  Please call the care guide team at [phone] if you need to cancel or reschedule your appointment.   If you are experiencing a Mental Health or Behavioral Health Crisis or need someone to talk to, please call 1-800-273-TALK (toll free, 24 hour hotline)  Patient verbalizes understanding of instructions and care plan provided today and agrees to view in MyChart. Active MyChart status and patient understanding of how to access instructions and care plan via MyChart confirmed with patient.     Juanell Fairly RN, BSN, [facility]  [facility], [facility]  Care  Coordinator Phone: [phone]

## 2023-04-07 NOTE — Patient Outreach (Signed)
 Care Coordination   Follow Up Visit Note   04/07/2023 Name: [name] MRN: [id number] DOB: 21-Feb-1955  [name] is a 68 y.o. year old male who sees [name], Terry Fila, MD for primary care. I spoke with  [name] by phone today.  What matters to the patients health and wellness today?  [name] reports that his respiratory condition remains stable. He expressed the need for guidance on how to prioritize his medications, as he is consulting multiple physicians and receiving various prescriptions. A referral will be made to the pharmacy to assist him in reviewing his medication regimen.  He continues to require 2 liters of oxygen and generally reports restful sleep; however, he does experience mild swelling in his ankles. I recommended that he maintain his current medication regimen and advised him to contact his pulmonologist should he encounter any breathing difficulties. For additional health-related inquiries, he may reach out to his primary care physician.     Goals Addressed             This Visit's Progress    Maintain, Monitor and Self-Manage Symptoms of COPD       Patient Goals/Self Care Activities: -Patient/Caregiver will take medications as prescribed   -Patient/Caregiver will attend all scheduled provider appointments -Patient/Caregiver will call pharmacy for medication refills 3-7 days in advance of running out of medications -Patient/Caregiver will call provider office for new concerns or questions  -Patient/Caregiver will focus on medication adherence by taking medications as prescribed   Advised patient to track and manage COPD triggers Advised patient to self assesses COPD action plan zone and make appointment with provider if in the yellow zone for 48 hours without improvement Discussed the importance of adequate rest and management of fatigue with COPD Avoid irritants that could exacerbate your condition Drink fluids Advised him to see physician if  symptoms are not better Take you medications Wear a mask when out around others Follow up with pharmacist regarding medications         SDOH assessments and interventions completed:  No     Care Coordination Interventions:  Yes, provided   Interventions Today    Flowsheet Row Most Recent Value  Chronic Disease   Chronic disease during today's visit Chronic Obstructive Pulmonary Disease (COPD)  General Interventions   General Interventions Discussed/Reviewed Communication with  Communication with Pharmacists  [Sent referral]  Pharmacy Interventions   Pharmacy Dicussed/Reviewed Pharmacy Topics Discussed  Safety Interventions   Safety Discussed/Reviewed Safety Discussed        Follow up plan: Follow up call scheduled for 05/06/23  130 pm    Encounter Outcome:  Patient Visit Completed   Juanell Fairly RN, BSN, [facility]  [facility], [facility]  Care Coordinator Phone: [phone]

## 2023-04-08 NOTE — Progress Notes (Signed)
 Internal Medicine Clinic Attending  Case discussed with the resident at the time of the visit.  We reviewed the resident's history and exam and pertinent patient test results.  I agree with the assessment, diagnosis, and plan of care documented in the resident's note. Increase daily steroids for 5 days for concern for COPD exacerbation. Patient is on cyclic antibiotics and have advised to reach out to pulmonology to discuss starting this month early. CT scan scheduled 3/6.

## 2023-04-08 NOTE — Addendum Note (Signed)
 Addended by: Dickie La on: 04/08/2023 10:51 AM   Modules accepted: Level of Service

## 2023-04-09 ENCOUNTER — Other Ambulatory Visit: Payer: 59

## 2023-04-09 ENCOUNTER — Ambulatory Visit
Admission: RE | Admit: 2023-04-09 | Discharge: 2023-04-09 | Disposition: A | Discharge status: Home / Self Care | Payer: 59 | Source: Ambulatory Visit | Attending: Emergency Medicine | Admitting: Emergency Medicine

## 2023-04-09 DIAGNOSIS — R918 Other nonspecific abnormal finding of lung field: Secondary | ICD-10-CM | POA: Diagnosis not present

## 2023-04-09 DIAGNOSIS — J439 Emphysema, unspecified: Secondary | ICD-10-CM | POA: Diagnosis not present

## 2023-04-09 DIAGNOSIS — R9389 Abnormal findings on diagnostic imaging of other specified body structures: Secondary | ICD-10-CM

## 2023-04-09 DIAGNOSIS — Z902 Acquired absence of lung [part of]: Secondary | ICD-10-CM | POA: Diagnosis not present

## 2023-04-10 ENCOUNTER — Other Ambulatory Visit (HOSPITAL_BASED_OUTPATIENT_CLINIC_OR_DEPARTMENT_OTHER): Payer: Self-pay

## 2023-04-10 DIAGNOSIS — M533 Sacrococcygeal disorders, not elsewhere classified: Secondary | ICD-10-CM | POA: Diagnosis not present
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
## 2023-04-14 ENCOUNTER — Telehealth: Payer: Self-pay | Admitting: *Deleted

## 2023-04-14 NOTE — Progress Notes (Signed)
 Care Guide Pharmacy Note  04/14/2023 Name: JERMANY RIMEL MRN: [id number] DOB: 08-08-1955  Referred By: Olegario Messier, MD Reason for referral: Care Coordination (Outreach to schedule referral with PharmD )   [name] is a 68 y.o. year old male who is a primary care patient of Nooruddin, Jason Fila, MD.  [name] was referred to the pharmacist for assistance related to: COPD and emphysema  An unsuccessful telephone outreach was attempted today to contact the patient who was referred to the pharmacy team for assistance with medication management. Additional attempts will be made to contact the patient.  Gwenevere Ghazi  [facility], [facility] Guide  Direct Dial: [phone]  Fax [phone]

## 2023-04-15 ENCOUNTER — Inpatient Hospital Stay: Payer: 59 | Attending: Internal Medicine

## 2023-04-15 ENCOUNTER — Ambulatory Visit: Admitting: Licensed Clinical Social Worker

## 2023-04-15 ENCOUNTER — Inpatient Hospital Stay (HOSPITAL_BASED_OUTPATIENT_CLINIC_OR_DEPARTMENT_OTHER): Payer: 59 | Admitting: Physician Assistant

## 2023-04-15 VITALS — BP 130/78 | HR 66 | Temp 97.6°F | Resp 16 | Ht 69.0 in | Wt 151.7 lb

## 2023-04-15 DIAGNOSIS — C642 Malignant neoplasm of left kidney, except renal pelvis: Secondary | ICD-10-CM | POA: Diagnosis not present

## 2023-04-15 DIAGNOSIS — Z9981 Dependence on supplemental oxygen: Secondary | ICD-10-CM | POA: Insufficient documentation

## 2023-04-15 DIAGNOSIS — F32A Depression, unspecified: Secondary | ICD-10-CM

## 2023-04-15 DIAGNOSIS — Z905 Acquired absence of kidney: Secondary | ICD-10-CM | POA: Insufficient documentation

## 2023-04-15 DIAGNOSIS — R918 Other nonspecific abnormal finding of lung field: Secondary | ICD-10-CM | POA: Insufficient documentation

## 2023-04-15 DIAGNOSIS — Z85118 Personal history of other malignant neoplasm of bronchus and lung: Secondary | ICD-10-CM | POA: Insufficient documentation

## 2023-04-15 DIAGNOSIS — Z902 Acquired absence of lung [part of]: Secondary | ICD-10-CM | POA: Diagnosis not present

## 2023-04-15 DIAGNOSIS — C3410 Malignant neoplasm of upper lobe, unspecified bronchus or lung: Secondary | ICD-10-CM

## 2023-04-15 DIAGNOSIS — C3490 Malignant neoplasm of unspecified part of unspecified bronchus or lung: Secondary | ICD-10-CM

## 2023-04-15 DIAGNOSIS — F419 Anxiety disorder, unspecified: Secondary | ICD-10-CM

## 2023-04-15 LAB — CMP (CANCER CENTER ONLY)
ALT: 8 U/L (ref 0–44)
AST: 11 U/L — ABNORMAL LOW (ref 15–41)
Albumin: 4.2 g/dL (ref 3.5–5.0)
Alkaline Phosphatase: 114 U/L (ref 38–126)
Anion gap: 6 (ref 5–15)
BUN: 10 mg/dL (ref 8–23)
CO2: 29 mmol/L (ref 22–32)
Calcium: 9.2 mg/dL (ref 8.9–10.3)
Chloride: 106 mmol/L (ref 98–111)
Creatinine: 1 mg/dL (ref 0.61–1.24)
GFR, Estimated: >60 mL/min (ref >60)
Glucose, Bld: 98 mg/dL (ref 70–99)
Potassium: 3.7 mmol/L (ref 3.5–5.1)
Sodium: 141 mmol/L (ref 135–145)
Total Bilirubin: 0.7 mg/dL (ref 0.0–1.2)
Total Protein: 7.1 g/dL (ref 6.5–8.1)

## 2023-04-15 LAB — CBC WITH DIFFERENTIAL (CANCER CENTER ONLY)
Abs Immature Granulocytes: 0.06 10*3/uL (ref 0.00–0.07)
Basophils Absolute: 0.1 10*3/uL (ref 0.0–0.1)
Basophils Relative: 1 %
Eosinophils Absolute: 0.2 10*3/uL (ref 0.0–0.5)
Eosinophils Relative: 3 %
HCT: 41.6 % (ref 39.0–52.0)
Hemoglobin: 13.3 g/dL (ref 13.0–17.0)
Immature Granulocytes: 1 %
Lymphocytes Relative: 12 %
Lymphs Abs: 1.1 10*3/uL (ref 0.7–4.0)
MCH: 29.6 pg (ref 26.0–34.0)
MCHC: 32 g/dL (ref 30.0–36.0)
MCV: 92.7 fL (ref 80.0–100.0)
Monocytes Absolute: 0.9 10*3/uL (ref 0.1–1.0)
Monocytes Relative: 9 %
Neutro Abs: 7.2 10*3/uL (ref 1.7–7.7)
Neutrophils Relative %: 74 %
Platelet Count: 419 10*3/uL — ABNORMAL HIGH (ref 150–400)
RBC: 4.49 MIL/uL (ref 4.22–5.81)
RDW: 14.5 % (ref 11.5–15.5)
WBC Count: 9.6 10*3/uL (ref 4.0–10.5)
nRBC: 0 % (ref 0.0–0.2)

## 2023-04-15 NOTE — BH Specialist Note (Signed)
 LCSW-A, [facility] spoke with patient on today via telehealth telephone using HIPAA compliant plate form. Patient verified DOB and name. Patient has conflicts with other appointments. Patient reschedule to 03/17 at 1:30pm in person.   Christen Butter, MSW, LCSW-A She/Her Behavioral Health Clinician [facility]  Internal Medicine Center Direct Dial:[phone]  Fax [phone] Main Office Phone: [phone] [address] [place] [place], [place] [address] Website: [facility] Internal Medicine [facility]  [place], [place]  [place]

## 2023-04-16 ENCOUNTER — Ambulatory Visit (INDEPENDENT_AMBULATORY_CARE_PROVIDER_SITE_OTHER): Payer: 59 | Admitting: Student

## 2023-04-16 VITALS — BP 123/85 | HR 67 | Temp 98.1°F | Ht 69.0 in | Wt 152.7 lb

## 2023-04-16 DIAGNOSIS — J439 Emphysema, unspecified: Secondary | ICD-10-CM

## 2023-04-16 DIAGNOSIS — C3490 Malignant neoplasm of unspecified part of unspecified bronchus or lung: Secondary | ICD-10-CM | POA: Diagnosis not present

## 2023-04-16 DIAGNOSIS — K429 Umbilical hernia without obstruction or gangrene: Secondary | ICD-10-CM | POA: Diagnosis not present

## 2023-04-16 DIAGNOSIS — L219 Seborrheic dermatitis, unspecified: Secondary | ICD-10-CM

## 2023-04-16 DIAGNOSIS — F419 Anxiety disorder, unspecified: Secondary | ICD-10-CM

## 2023-04-16 DIAGNOSIS — F32A Depression, unspecified: Secondary | ICD-10-CM

## 2023-04-16 DIAGNOSIS — F458 Other somatoform disorders: Secondary | ICD-10-CM

## 2023-04-16 DIAGNOSIS — J301 Allergic rhinitis due to pollen: Secondary | ICD-10-CM | POA: Diagnosis not present

## 2023-04-16 MED ORDER — CETIRIZINE HCL 10 MG PO TABS: 10.0000 mg | ORAL_TABLET | Freq: Every day | ORAL | 2 refills | Status: DC

## 2023-04-16 MED ORDER — ESCITALOPRAM OXALATE 20 MG PO TABS: 20.0000 mg | ORAL_TABLET | Freq: Every day | ORAL | 2 refills | Status: DC

## 2023-04-16 NOTE — Assessment & Plan Note (Signed)
 Symptoms of runny nose. He takes flonase. He was on allegra in the past but stopped. He was on cetirizine in the past and states it helped very much. - restart cetirizine 10 every day prn

## 2023-04-16 NOTE — Patient Instructions (Signed)
 Mr [name], [place] taking cetirizine. I hope it helps your nose like it did in the past. It may also help your itching. For the back itching, also use your triamcinolone cream as needed.  If your hernia causes you severe and sudden pain or you stop having bowel movements, go to the ER. However, I highly doubt that will happen. If the pain slowly increases to bother you enough to consider surgery, I am happy to refer you to a surgeon.  Please return in 3 months.  Best regards, Dr. Ninfa Meeker
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
## 2023-04-16 NOTE — Assessment & Plan Note (Signed)
 2 month follow up having increased lexapro from 10 -> 20 every day. His primary symptom is anxiety. He feels that his anxiety is improved, although not fully controlled. He is happy with his medication and does not want to change dose or add other therapies.  - continue lexapro 20 qd

## 2023-04-16 NOTE — Assessment & Plan Note (Signed)
 He noticed umbilical welling about one month ago. No clear etiology. On exam, hernia is not reducible but not strangulated. There is mild tenderness. There is no erythema. He is having bowel movements. Precautions given for signs of ischemia. If it becomes bothersome enough to consider surgery, I am happy to refer him.

## 2023-04-17 DIAGNOSIS — F458 Other somatoform disorders: Secondary | ICD-10-CM | POA: Insufficient documentation

## 2023-04-17 MED ORDER — ZIKS ARTHRITIS PAIN RELIEF 0.025-1-12 % EX CREA: TOPICAL_CREAM | CUTANEOUS | 5 refills | Status: DC

## 2023-04-17 NOTE — Assessment & Plan Note (Signed)
 Evaluated at previous office visit, itchiness and skin changes at scalp. Itchiness improving and no visible dermatitis. Treatment worked. Continue: - Nizoral 2% shampoo - Hydrocortisone 1% cream prn

## 2023-04-17 NOTE — Assessment & Plan Note (Signed)
 He describes constant and moderately distressing itchiness at an approximately baseball-sized area in the mid back, midline near T7. On exam there are certainly no skin changes. He has tried steroid creams without relief. Doubt dermatitis and I did consider a heat/sweat-based irritation, but interestingly this was the site of a spider bite with wound from many decades ago. I suspect a lingering neurogenic pruritus subsequent to the injury. I hope that the cetirizine I am starting for his allergies offers some relief. I will also try capsaicin cream. If he fails that, can consider oral therapy for nerve discomfort such as low dose gabapentin, duloxetine, even naltrexone after evaluation, if appropriate and if he is interested. But for now: - Cetirizine 10 every day - capsaicin/menthol/methyl salicylate cream prn

## 2023-04-17 NOTE — Assessment & Plan Note (Signed)
 He was evaluated in this office last month with suspicion of mild COPD exacerbation treated with OP steroids. He responded well and is back to his baseline. He will continue his OP steroid regimen per pulmonology: - prednisone 10mg  every third day

## 2023-04-17 NOTE — Assessment & Plan Note (Addendum)
 He reports that he is currently initiating workup for bilateral pulmonary nodules at high malignant risk given a history of previous disease with lobectomy in 2013. At present his team re pulmonology/oncology is evaluating the safety of bronchoscopy with overall treatment goals to come.

## 2023-04-20 ENCOUNTER — Ambulatory Visit (INDEPENDENT_AMBULATORY_CARE_PROVIDER_SITE_OTHER): Admitting: Licensed Clinical Social Worker

## 2023-04-20 DIAGNOSIS — F419 Anxiety disorder, unspecified: Secondary | ICD-10-CM | POA: Diagnosis not present

## 2023-04-20 DIAGNOSIS — F32A Depression, unspecified: Secondary | ICD-10-CM | POA: Diagnosis not present

## 2023-04-20 NOTE — BH Specialist Note (Signed)
 Integrated Behavioral Health Follow Up In-Person Visit  MRN: [id number] Name: [name]  Number of Integrated Behavioral Health Clinician visits: Additional Visit  Session Start time: 1305   Session End time: 1421  Total time in minutes: 76   Types of Service: Individual psychotherapy and General Behavioral Integrated Care (BHI)  Interpretor:No. Interpretor Name and Language: N/A  Subjective: [name] is a 68 y.o. male  Patient was referred by PCP for [facility]. Patient reports the following symptoms/concerns: Increased anxiety and concerns with Home health provider. Patient and therapist discussed resolutions and plans to request a new Home Health worker. Patient spent session discussing his history of addiction, upcoming celebration of 10 years in full remission of Crack. Patient will continue working to find treatment to relieve pain and patient will work on Psychologist, occupational.     Objective: Mood: Anxious and Depressed and Affect: Appropriate Risk of harm to self or others: No plan to harm self or others    Patient and/or Family's Strengths/Protective Factors: Sense of purpose  Goals Addressed: Patient will:  Reduce symptoms of: anxiety and depression   Increase knowledge and/or ability of: healthy habits   Demonstrate ability to: Increase healthy adjustment to current life circumstances  Progress towards Goals: Ongoing  Interventions: Interventions utilized:  Mindfulness or Management consultant and CBT Cognitive Behavioral Therapy Standardized Assessments completed: PHQ-SADS    02/17/2023    2:35 PM 02/17/2023    2:25 PM 01/20/2023    2:51 PM  PHQ-SADS Last 3 Score only  Total GAD-7 Score 1    PHQ Adolescent Score  3 4     Patient and/or Family Response: Patient continues to enjoy therapy.  Patient Centered Plan: Patient is on the following Treatment Plan(s): OPT Assessment: Patient currently experiencing Anxiety and Depression due to medical  diagnosis..   Patient may benefit from ongoing OPT.  Plan: Follow up with behavioral health clinician on : 04/01 and 04/10  Christen Butter, MSW, LCSW-A She/Her Behavioral Health Clinician [facility]  Internal Medicine Center Direct Dial:[phone]  Fax [phone] Main Office Phone: [phone] [address] [place] [place], [place] [address] Website: [facility] Internal Medicine [facility]  [place], [place]  [place]

## 2023-04-21 DIAGNOSIS — M47816 Spondylosis without myelopathy or radiculopathy, lumbar region: Secondary | ICD-10-CM | POA: Diagnosis not present

## 2023-04-21 DIAGNOSIS — M5416 Radiculopathy, lumbar region: Secondary | ICD-10-CM | POA: Diagnosis not present

## 2023-04-21 DIAGNOSIS — G8912 Acute post-thoracotomy pain: Secondary | ICD-10-CM | POA: Diagnosis not present

## 2023-04-21 DIAGNOSIS — M7918 Myalgia, other site: Secondary | ICD-10-CM | POA: Diagnosis not present

## 2023-04-21 DIAGNOSIS — Z79899 Other long term (current) drug therapy: Secondary | ICD-10-CM | POA: Diagnosis not present

## 2023-04-21 NOTE — Progress Notes (Signed)
 Care Guide Pharmacy Note  04/21/2023 Name: DAREION KNEECE MRN: [id number] DOB: 05/31/1955  Referred By: Olegario Messier, MD Reason for referral: Care Coordination (Outreach to schedule referral with PharmD )   [name] is a 68 y.o. year old male who is a primary care patient of Nooruddin, Jason Fila, MD.  [name] was referred to the pharmacist for assistance related to: CAD and COPD  A second unsuccessful telephone outreach was attempted today to contact the patient who was referred to the pharmacy team for assistance with medication management. Additional attempts will be made to contact the patient.  Gwenevere Ghazi  [facility]  [facility]-Based [facility], [facility] Guide  Direct Dial: [phone]  Fax [phone]

## 2023-04-21 NOTE — Progress Notes (Signed)
 Care Guide Pharmacy Note  04/21/2023 Name: WALTON DIGILIO MRN: [id number] DOB: [birth year]  Referred By: Olegario Messier, MD Reason for referral: Care Coordination (Outreach to schedule referral with PharmD )   [name] is a 68 y.o. year old male who is a primary care patient of Nooruddin, Jason Fila, MD.  [name] was referred to the pharmacist for assistance related to: COPD exacerbation and  Pulmonary emphysema,    Successful contact was made with the patient to discuss pharmacy services including being ready for the pharmacist to call at least 5 minutes before the scheduled appointment time and to have medication bottles and any blood pressure readings ready for review. The patient agreed to meet with the pharmacist via telephone visit on (date/time).4/15 at 2:00PM  Gwenevere Ghazi  [facility] Guide  Direct Dial: [phone]  Fax [phone]

## 2023-04-23 ENCOUNTER — Encounter: Payer: 59 | Admitting: Student

## 2023-04-23 NOTE — Progress Notes (Signed)
 Internal Medicine Clinic Attending  Case discussed with the resident at the time of the visit.  We reviewed the resident's history and exam and pertinent patient test results.  I agree with the assessment, diagnosis, and plan of care documented in the resident's note.

## 2023-04-23 NOTE — Addendum Note (Signed)
 Addended by: Burnell Blanks on: 04/23/2023 01:04 PM   Modules accepted: Level of Service

## 2023-04-25 DIAGNOSIS — J441 Chronic obstructive pulmonary disease with (acute) exacerbation: Secondary | ICD-10-CM | POA: Diagnosis not present

## 2023-04-26 ENCOUNTER — Telehealth: Payer: Self-pay | Admitting: Internal Medicine

## 2023-04-26 DIAGNOSIS — R0602 Shortness of breath: Secondary | ICD-10-CM | POA: Diagnosis not present

## 2023-04-26 DIAGNOSIS — Z20822 Contact with and (suspected) exposure to covid-19: Secondary | ICD-10-CM | POA: Diagnosis not present

## 2023-04-26 NOTE — Telephone Encounter (Signed)
   Patient paged earlire 1h ago with  dyspnea-> vomit -> chest pain. Called him back but [facility] is [facility]    SIGNATURE    Dr. Kalman Shan, M.D., F.C.C.P,  Pulmonary and Critical Care Medicine Staff Physician, [facility] Center Director - Interstitial Lung Disease  Program  Pulmonary Fibrosis [facility] [place], [place], [address]   Pager: [phone], If no answer  -> Check AMION or Try [phone] Telephone (clinical office): [phone] Telephone (research): [phone]  1:17 PM 04/26/2023

## 2023-04-28 DIAGNOSIS — R350 Frequency of micturition: Secondary | ICD-10-CM | POA: Diagnosis not present

## 2023-04-28 DIAGNOSIS — R3915 Urgency of urination: Secondary | ICD-10-CM | POA: Diagnosis not present

## 2023-05-05 ENCOUNTER — Ambulatory Visit (INDEPENDENT_AMBULATORY_CARE_PROVIDER_SITE_OTHER): Admitting: Licensed Clinical Social Worker

## 2023-05-05 DIAGNOSIS — F32A Depression, unspecified: Secondary | ICD-10-CM | POA: Diagnosis not present

## 2023-05-05 DIAGNOSIS — F419 Anxiety disorder, unspecified: Secondary | ICD-10-CM

## 2023-05-05 NOTE — BH Specialist Note (Signed)
 Integrated Behavioral Health via Telemedicine Visit  05/05/2023 [name] [id number]  Number of Integrated Behavioral Health Clinician visits: Additional Visit  Session Start time: 1330   Session End time: 1430  Total time in minutes: 60   Referring Provider: PCP Patient/Family location: Home  [facility] Provider location: Office All persons participating in visit: [facility] and Patient Types of Service: General Behavioral Integrated Care (BHI)   I connected with [name]  via  Telephone and verified that I am speaking with the correct person using two identifiers. Discussed confidentiality: Yes    I discussed the limitations of telemedicine and the availability of in person appointments.  Discussed there is a possibility of technology failure and discussed alternative modes of communication if that failure occurs.   I discussed that engaging in this telemedicine visit, they consent to the provision of behavioral healthcare and the services will be billed under their insurance.   Patient and/or legal guardian expressed understanding and consented to Telemedicine visit: Yes    Presenting Concerns: Patient and/or family reports the following symptoms/concerns:The Behavioral Health Clinician [facility]) conducted a telehealth session today using a HIPAA-compliant platform. The client presented as enthusiastic and optimistic, describing himself as "feeling good." He demonstrated a goal-oriented mindset while discussing his plans for the day, showing motivation to continue working on his mental health. The client agreed to attend his next session in person, which is scheduled for April 15th at 2:00 PM. He will continue focusing on strategies to manage his anxiety and depression, alongside attending daily Narcotics Anonymous (NA) meetings.  Recommendations:  Mindfulness practices, such as deep breathing exercises or guided meditation, can help reduce stress and promote relaxation. Journaling is  another valuable tool, to reflect on patients thoughts and feelings while identifying triggers for anxiety or depressive episodes. Engaging in creative outlets like painting, drawing, or crafting can provide a sense of accomplishment and serve as a calming activity.   Structured routines that include meaningful activities--such as reading, gardening, or listening to music--can provide stability and purpose while helping to alleviate symptoms of depression.   Regular engagement in meaningful activities combined with ongoing support from therapy sessions will help patient continue making progress toward his mental health goals.     Patient and/or Family's Strengths/Protective Factors: Sense of purpose   Goals Addressed: Patient will:  Reduce symptoms of: anxiety and depression    Progress towards Goals: Ongoing   Interventions: Interventions utilized:  CBT Cognitive Behavioral Therapy Standardized Assessments completed: Not Needed   Patient and/or Family Response: Patient agrees to ongoing therapy services.    Assessment: Patient currently experiencing Depression and anxiety.    Patient may benefit from Ongoing OPT.   Plan: Follow up with behavioral health clinician on : 04/15 at 2pm in-person     I discussed the assessment and treatment plan with the patient and/or parent/guardian. They were provided an opportunity to ask questions and all were answered. They agreed with the plan and demonstrated an understanding of the instructions.   They were advised to call back or seek an in-person evaluation if the symptoms worsen or if the condition fails to improve as anticipated.   [name], MSW, LCSW-A She/Her Behavioral Health Clinician [facility]  Internal Medicine Center Direct [phone]  Fax [phone] Main Office Phone: [phone] [address] [place] [place], [place] [address] Website: [facility] Internal Medicine [facility]  [place], [place]  [facility]

## 2023-05-06 ENCOUNTER — Ambulatory Visit: Payer: Self-pay

## 2023-05-06 NOTE — Patient Instructions (Signed)
 Visit Information  Thank you for taking time to visit with me today. Please don't hesitate to contact me if I can be of assistance to you.   Following are the goals we discussed today:   Goals Addressed             This Visit's Progress    Maintain, Monitor and Self-Manage Symptoms of COPD       Patient Goals/Self Care Activities: -Patient/Caregiver will take medications as prescribed   -Patient/Caregiver will attend all scheduled provider appointments -Patient/Caregiver will call pharmacy for medication refills 3-7 days in advance of running out of medications -Patient/Caregiver will call provider office for new concerns or questions  -Patient/Caregiver will focus on medication adherence by taking medications as prescribed   Advised patient to track and manage COPD triggers Advised patient to self assesses COPD action plan zone and make appointment with provider if in the yellow zone for 48 hours without improvement Discussed the importance of adequate rest and management of fatigue with COPD Avoid irritants that could exacerbate your condition Drink fluids Advised him to see physician if symptoms are not better Take you medications Wear a mask when out around others Follow up with pharmacist regarding medications Wear your mask when going out due to the increase in pollen         Our next appointment is by telephone on 06/11/23 at 130 pm  Please call the care guide team at [phone] if you need to cancel or reschedule your appointment.   If you are experiencing a Mental Health or Behavioral Health Crisis or need someone to talk to, please call 1-800-273-TALK (toll free, 24 hour hotline)  Patient verbalizes understanding of instructions and care plan provided today and agrees to view in MyChart. Active MyChart status and patient understanding of how to access instructions and care plan via MyChart confirmed with patient.     Juanell Fairly RN, BSN, [facility]   [facility], [facility]  Care Coordinator Phone: [phone]

## 2023-05-06 NOTE — Patient Outreach (Addendum)
 Care Coordination   Follow Up Visit Note   05/06/2023 Name: [name] MRN: [id number] DOB: 01-05-1956  [name] is a 68 y.o. year old male who sees Nooruddin, Jason Fila, MD for primary care. I spoke with  [name] by phone today.  What matters to the patients health and wellness today? [name] has indicated that he has been experiencing overall well-being. He attended urgent care on March 23rd due to breathing difficulties. During this visit, he reported episodes of vomiting and initially suspected the presence of blood. However, upon further consideration, he suspected that the Robitussin he had taken, which possesses a pinkish hue, leading him to believe that this was the cause of his concern. Presently, [name] breathing has improved, although he continues to require 2 liters of supplemental oxygen. He is adhering to his prescribed medications.  To manage allergy symptoms associated with pollen exposure Iadvised he routinely wear a mask when outdoors. His dietary intake is moderate, and he reports experiencing quality sleep. Furthermore, he has enrolled in palliative care.     Goals Addressed             This Visit's Progress    Maintain, Monitor and Self-Manage Symptoms of COPD       Patient Goals/Self Care Activities: -Patient/Caregiver will take medications as prescribed   -Patient/Caregiver will attend all scheduled provider appointments -Patient/Caregiver will call pharmacy for medication refills 3-7 days in advance of running out of medications -Patient/Caregiver will call provider office for new concerns or questions  -Patient/Caregiver will focus on medication adherence by taking medications as prescribed   Advised patient to track and manage COPD triggers Advised patient to self assesses COPD action plan zone and make appointment with provider if in the yellow zone for 48 hours without improvement Discussed the importance of adequate rest and management of  fatigue with COPD Avoid irritants that could exacerbate your condition Drink fluids Advised him to see physician if symptoms are not better Take you medications Wear a mask when out around others Follow up with pharmacist regarding medications Wear your mask when going out due to the increase in pollen         SDOH assessments and interventions completed:  No     Care Coordination Interventions:  Yes, provided  Interventions Today    Flowsheet Row Most Recent Value  Chronic Disease   Chronic disease during today's visit Chronic Obstructive Pulmonary Disease (COPD)  General Interventions   General Interventions Discussed/Reviewed General Interventions Discussed, General Interventions Reviewed, Doctor Visits  Doctor Visits Discussed/Reviewed Doctor Visits Discussed  Exercise Interventions   Exercise Discussed/Reviewed Exercise Discussed  Pharmacy Interventions   Pharmacy Dicussed/Reviewed Pharmacy Topics Discussed, Pharmacy Topics Reviewed  Safety Interventions   Safety Discussed/Reviewed Safety Discussed        Follow up plan: Follow up call scheduled for 06/11/23  130 pm    Encounter Outcome:  Patient Visit Completed   Juanell Fairly RN, BSN, [facility] [place]  [facility], [facility]  Care Coordinator Phone: [phone]

## 2023-05-07 ENCOUNTER — Ambulatory Visit: Payer: 59 | Admitting: Emergency Medicine

## 2023-05-07 ENCOUNTER — Encounter: Payer: Self-pay | Admitting: Emergency Medicine

## 2023-05-07 ENCOUNTER — Encounter

## 2023-05-07 VITALS — BP 122/79 | HR 65 | Ht 69.0 in | Wt 155.4 lb

## 2023-05-07 DIAGNOSIS — J9611 Chronic respiratory failure with hypoxia: Secondary | ICD-10-CM | POA: Diagnosis not present

## 2023-05-07 DIAGNOSIS — J439 Emphysema, unspecified: Secondary | ICD-10-CM | POA: Diagnosis not present

## 2023-05-07 DIAGNOSIS — J301 Allergic rhinitis due to pollen: Secondary | ICD-10-CM | POA: Diagnosis not present

## 2023-05-07 DIAGNOSIS — R9389 Abnormal findings on diagnostic imaging of other specified body structures: Secondary | ICD-10-CM | POA: Diagnosis not present

## 2023-05-07 MED ORDER — IPRATROPIUM BROMIDE 0.03 % NA SOLN: 2.0000 | Freq: Two times a day (BID) | NASAL | 12 refills | Status: DC
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
## 2023-05-07 NOTE — Assessment & Plan Note (Signed)
 We will send a prescription for Atrovent nasal spray, 2 sprays each nostril up to twice a day when you need it for congestion and drainage

## 2023-05-07 NOTE — Progress Notes (Signed)
 Subjective:    Patient ID: [name], male    DOB: 1955-09-30, 68 y.o.   MRN: [id number]  COPD He complains of cough and shortness of breath. There is no wheezing. Pertinent negatives include no ear pain, fever, headaches, postnasal drip, rhinorrhea, sneezing, sore throat or trouble swallowing. His past medical history is significant for COPD.    ROV 03/04/2023 --68 year old male with very severe COPD and bilateral surgical resection for adenocarcinoma, associated chronic bronchitic symptoms and chronic hypoxemic respiratory failure.  Had been managed on Trelegy, rotating antibiotics, chronic prednisone.  Most recent imaging CT chest 12/2022 and also PET scan 01/09/2023 as below.  He was in the ED 02/15/2023 with wheezing, cough, dyspnea.  Diagnosed with RSV with a possible superimposed CAP.  He was treated with azithromycin and Augmentin.  Since I last saw him he has engaged with palliative care support. He feels better than 3 wks ago, still some cough but sputum burden. He tells me that he stopped taking his prednisone about 2 weeks ago. He is contemplating whether he would want therapy for lung cancer if he has it - states that he is unsure. He is still having a lot of trouble with diarrhea, upset stomach.   PET scan 01/09/2023 reviewed by me showed various waxing waning pulmonary nodules, some previous ones which have resolved or gotten smaller, some new ones with some hypermetabolism.  No evidence of any distant disease.   ROV 05/07/2023 --[name] has very severe COPD, history of adenocarcinoma with bilateral surgical resections.  His symptoms include dyspnea, chronic bronchitis, chronic allergic rhinitis.  He has chronic hypoxemic respiratory failure.  I have him on Trelegy, rotating antibiotics (cefuroxime, doxycycline, azithromycin), prednisone 10 mg daily, DuoNeb, Flonase. He has waxing and waning pulmonary nodular disease on serial imaging including most recently PET scan done  01/09/2023  CT scan of the chest 04/09/2023 reviewed by me, shows significant emphysema, continued enlargement of a branching right upper lobe nodule now 2.3 x 0.8 cm, new 9 x 4 mm right upper lobe nodule, progressive nodular consolidation inferiorly in the right lower lobe 2.2 x 1.3 cm, several new left lower lobe nodules measuring up to 8 mm including at least 1 nodule that is decreased in size.  Stable 1.5 cm left lower lobe nodule                                                                                                                                            Review of Systems  Constitutional:  Negative for fever and unexpected weight change.  HENT:  Positive for congestion. Negative for dental problem, ear pain, nosebleeds, postnasal drip, rhinorrhea, sinus pressure, sneezing, sore throat and trouble swallowing.   Eyes:  Negative for redness and itching.  Respiratory:  Positive for cough and shortness of breath. Negative for chest tightness and wheezing.   Cardiovascular:  Positive for palpitations. Negative for leg swelling.  Gastrointestinal:  Negative for nausea and vomiting.  Genitourinary:  Negative for dysuria.  Musculoskeletal:  Negative for joint swelling.  Skin:  Negative for rash.  Neurological:  Negative for headaches.  Hematological:  Does not bruise/bleed easily.  Psychiatric/Behavioral:  Positive for dysphoric mood. The patient is nervous/anxious.        Objective:   Physical Exam  Vitals:   05/07/23 1428  BP: 122/79  Pulse: 65  SpO2: 95%  Weight: 155 lb 6.4 oz (70.5 kg)  Height: 5\' 9"  (1.753 m)    Gen: Pleasant, well-nourished, in no distress, depressed affect  ENT: No lesions,  mouth clear,  oropharynx clear, no postnasal drip, evolving some cushingoid facies  Neck: No JVD, no stridor  Lungs: No use of accessory muscles, very distant, overall clear, some mild wheeze on forced expiration  Cardiovascular: RRR, heart sounds normal, no murmur or  gallops, trace peripheral edema  Musculoskeletal: no deformities   Neuro: alert, non focal  Skin: Warm, no lesions or rashes     Assessment & Plan:  Abnormal CT of the chest The overall trend of his pulmonary nodules is that they are enlarging.  There is 1 nodule that did decrease in size.  Unclear etiology but given his history certainly very concerning for possible lung cancer versus metastatic disease from renal carcinoma.  We talked about possible bronchoscopy versus watchful waiting.  Also did briefly discuss the potential option of empiric treatment based on his history.  I read [name] note and this is a potential plan if we do not think [name] could handle bronchoscopy and biopsies.  He is marginal for general anesthesia.  I explained this to him today.  We decided that we are going to follow 1 more CT scan at the 4-month mark to look for interval change.  At that time we will decide about bronchoscopy.  I think that probably this is the next best step as it will allow [name] to treat systemically with most appropriate therapy.  COPD with emphysema (HCC) Continue inhaled medication as you have been taking  Allergic rhinitis We will send a prescription for Atrovent nasal spray, 2 sprays each nostril up to twice a day when you need it for congestion and drainage  Chronic respiratory failure with hypoxia (HCC) Continue your oxygen at all times   Time spent 40 minutes   Terry Pupa, MD, PhD 05/07/2023, 4:25 PM [facility] [phone] or if no answer [phone]

## 2023-05-07 NOTE — Assessment & Plan Note (Signed)
Continue your oxygen at all times.

## 2023-05-07 NOTE — Assessment & Plan Note (Signed)
 Continue inhaled medication as you have been taking

## 2023-05-07 NOTE — Patient Instructions (Signed)
 We reviewed your CT scan of the chest today. We will plan to repeat your CT in 3 months.  Depending on that scan we will consider scheduling bronchoscopy to evaluate your pulmonary nodules. Continue inhaled medication as you have been taking Continue your oxygen at all times We will send a prescription for Atrovent nasal spray, 2 sprays each nostril up to twice a day when you need it for congestion and drainage Follow Dr. Delton Coombes in June after your CT chest so we can review those results together

## 2023-05-07 NOTE — Assessment & Plan Note (Signed)
 The overall trend of his pulmonary nodules is that they are enlarging.  There is 1 nodule that did decrease in size.  Unclear etiology but given his history certainly very concerning for possible lung cancer versus metastatic disease from renal carcinoma.  We talked about possible bronchoscopy versus watchful waiting.  Also did briefly discuss the potential option of empiric treatment based on his history.  I read Dr. Asa Lente note and this is a potential plan if we do not think [name] could handle bronchoscopy and biopsies.  He is marginal for general anesthesia.  I explained this to him today.  We decided that we are going to follow 1 more CT scan at the 59-month mark to look for interval change.  At that time we will decide about bronchoscopy.  I think that probably this is the next best step as it will allow Dr. Arbutus Ped to treat systemically with most appropriate therapy.

## 2023-05-08 ENCOUNTER — Telehealth: Payer: Self-pay | Admitting: Emergency Medicine

## 2023-05-08 NOTE — Telephone Encounter (Signed)
 Patient has the medication that he needs to be filled. IT is called Finasteride.   Pharmacy: [facility]

## 2023-05-11 ENCOUNTER — Telehealth: Payer: Self-pay

## 2023-05-11 ENCOUNTER — Ambulatory Visit: Payer: Self-pay

## 2023-05-11 NOTE — Telephone Encounter (Signed)
  Chief Complaint: urinary frequency Symptoms: burning sensation to abdomen and testicular/pelvic region. Frequent urination with pain and discomfort passing urine Frequency: "going on for awhile" Pertinent Negatives: Patient denies CP, SOB Disposition: [] ED /[] Urgent Care (no appt availability in office) / [] Appointment(In office/virtual)/ []  [facility]/ [] Home Care/ [] Refused Recommended Disposition /[] [facility]/ [x]  Follow-up with PCP Additional Notes: patient calling with the main objective to see if he can be prescribed finasteride. This was suggested to him by his pharmacist. Patient endorses urinary frequency, burning sensation to abdomen and testicular/pelvic region. Endorses pain and discomfort at times with passing urine. Per protocol, patient is recommended to be seen in 24 hours but no availability that this RN can see. Please call patient back for appointment. Patient verbalized understanding of plan and all questions answered.    Copied from CRM [id number]. Topic: Clinical - Medication Question >> May 11, 2023  4:35 PM Tiffany H wrote: Reason for CRM: Patient called to advise that he went to the pharmacy to consult with pharmacist about burning sensation in his abdomen and in testicular/pelvic area. Pharmacist suggested Finasteride. Patient advised that he is also experiencing frequent urination, pain and discomfort when passing urine. Sometimes he cannot pass urine despite having the sensation of needing to use the bathroom. Patient advised he also has a little swelling in ankles and around calves.   Patient would like to know if Finasteride will help to relieve some of his symptoms.   Please assist. Reason for Disposition  All other males with painful urination  Answer Assessment - Initial Assessment Questions 1. SEVERITY: "How bad is the pain?"  (e.g., Scale 1-10; mild, moderate, or severe)   - MILD (1-3): Complains slightly about urination hurting.   -  MODERATE (4-7): Interferes with normal activities.     - SEVERE (8-10): Excruciating, unwilling or unable to urinate because of the pain.      3-4 out of 10 2. FREQUENCY: "How many times have you had painful urination today?"      4-5 times 3. PATTERN: "Is pain present every time you urinate or just sometimes?"      Not every time 4. ONSET: "When did the painful urination start?"      Going on for awhile 5. FEVER: "Do you have a fever?" If Yes, ask: "What is your temperature, how was it measured, and when did it start?"     no 6. PAST UTI: "Have you had a urine infection before?" If Yes, ask: "When was the last time?" and "What happened that time?"      no 7. CAUSE: "What do you think is causing the painful urination?"      unsure 8. OTHER SYMPTOMS: "Do you have any other symptoms?" (e.g., flank pain, penis discharge, scrotal pain, blood in urine)     Burning sensation in his abdomen and testicular/pelvic area.  Protocols used: Urination Pain - Male-A-AH

## 2023-05-11 NOTE — Telephone Encounter (Signed)
 This is a medicine that is used to prevent a large prostate.  He might benefit from the risks.  I think he needs to talk to his PCP about possibly starting this to ensure that it does not interact with any of his other medications.

## 2023-05-11 NOTE — Telephone Encounter (Signed)
 Sending MyChart message for patient clarification.

## 2023-05-11 NOTE — Telephone Encounter (Signed)
 Pt returning call. States pharmacy told him about med called Finasteride as he was complaining to them about having burning sensations in his stomach & urination problems that has been going on for a while. Pt would like to know if he could start taking med. Please advise Dr. Delton Coombes

## 2023-05-11 NOTE — Telephone Encounter (Signed)
 ATC x1 LDVMTCB.

## 2023-05-11 NOTE — Telephone Encounter (Signed)
 Called and spoke with pt regarding RB note. Pt verbalized understanding & had no other concerns. NFN

## 2023-05-12 ENCOUNTER — Inpatient Hospital Stay (HOSPITAL_BASED_OUTPATIENT_CLINIC_OR_DEPARTMENT_OTHER): Admitting: Physician Assistant

## 2023-05-12 ENCOUNTER — Inpatient Hospital Stay: Attending: Internal Medicine

## 2023-05-12 VITALS — BP 146/84 | HR 50 | Temp 97.2°F | Resp 16 | Wt 155.5 lb

## 2023-05-12 DIAGNOSIS — C3490 Malignant neoplasm of unspecified part of unspecified bronchus or lung: Secondary | ICD-10-CM

## 2023-05-12 DIAGNOSIS — Z85118 Personal history of other malignant neoplasm of bronchus and lung: Secondary | ICD-10-CM | POA: Diagnosis not present

## 2023-05-12 DIAGNOSIS — Z905 Acquired absence of kidney: Secondary | ICD-10-CM | POA: Diagnosis not present

## 2023-05-12 DIAGNOSIS — Z9981 Dependence on supplemental oxygen: Secondary | ICD-10-CM | POA: Insufficient documentation

## 2023-05-12 DIAGNOSIS — R918 Other nonspecific abnormal finding of lung field: Secondary | ICD-10-CM | POA: Insufficient documentation

## 2023-05-12 DIAGNOSIS — Z902 Acquired absence of lung [part of]: Secondary | ICD-10-CM | POA: Insufficient documentation

## 2023-05-12 DIAGNOSIS — Z85528 Personal history of other malignant neoplasm of kidney: Secondary | ICD-10-CM | POA: Insufficient documentation

## 2023-05-12 LAB — CBC WITH DIFFERENTIAL (CANCER CENTER ONLY)
Abs Immature Granulocytes: 0.06 10*3/uL (ref 0.00–0.07)
Basophils Absolute: 0.1 10*3/uL (ref 0.0–0.1)
Basophils Relative: 1 %
Eosinophils Absolute: 0.2 10*3/uL (ref 0.0–0.5)
Eosinophils Relative: 3 %
HCT: 38.3 % — ABNORMAL LOW (ref 39.0–52.0)
Hemoglobin: 12.5 g/dL — ABNORMAL LOW (ref 13.0–17.0)
Immature Granulocytes: 1 %
Lymphocytes Relative: 15 %
Lymphs Abs: 1 10*3/uL (ref 0.7–4.0)
MCH: 30 pg (ref 26.0–34.0)
MCHC: 32.6 g/dL (ref 30.0–36.0)
MCV: 91.8 fL (ref 80.0–100.0)
Monocytes Absolute: 0.6 10*3/uL (ref 0.1–1.0)
Monocytes Relative: 9 %
Neutro Abs: 5 10*3/uL (ref 1.7–7.7)
Neutrophils Relative %: 71 %
Platelet Count: 290 10*3/uL (ref 150–400)
RBC: 4.17 MIL/uL — ABNORMAL LOW (ref 4.22–5.81)
RDW: 14.7 % (ref 11.5–15.5)
WBC Count: 6.9 10*3/uL (ref 4.0–10.5)
nRBC: 0 % (ref 0.0–0.2)

## 2023-05-12 LAB — CMP (CANCER CENTER ONLY)
ALT: 8 U/L (ref 0–44)
AST: 12 U/L — ABNORMAL LOW (ref 15–41)
Albumin: 3.7 g/dL (ref 3.5–5.0)
Alkaline Phosphatase: 106 U/L (ref 38–126)
Anion gap: 4 — ABNORMAL LOW (ref 5–15)
BUN: 12 mg/dL (ref 8–23)
CO2: 28 mmol/L (ref 22–32)
Calcium: 8.9 mg/dL (ref 8.9–10.3)
Chloride: 106 mmol/L (ref 98–111)
Creatinine: 1.01 mg/dL (ref 0.61–1.24)
GFR, Estimated: >60 mL/min (ref >60)
Glucose, Bld: 90 mg/dL (ref 70–99)
Potassium: 3.9 mmol/L (ref 3.5–5.1)
Sodium: 138 mmol/L (ref 135–145)
Total Bilirubin: 0.4 mg/dL (ref 0.0–1.2)
Total Protein: 6.6 g/dL (ref 6.5–8.1)

## 2023-05-13 DIAGNOSIS — R3915 Urgency of urination: Secondary | ICD-10-CM | POA: Diagnosis not present

## 2023-05-13 DIAGNOSIS — R350 Frequency of micturition: Secondary | ICD-10-CM | POA: Diagnosis not present

## 2023-05-13 DIAGNOSIS — R300 Dysuria: Secondary | ICD-10-CM | POA: Diagnosis not present

## 2023-05-13 NOTE — Telephone Encounter (Signed)
 Called pt to schedule an appt - pt stated he's currently sitting in his urologist's office and he thinks the doctor could be able to help him.  And pt will call back if needed.

## 2023-05-14 ENCOUNTER — Ambulatory Visit: Admitting: Licensed Clinical Social Worker

## 2023-05-19 ENCOUNTER — Telehealth: Payer: Self-pay | Admitting: Pharmacist

## 2023-05-19 ENCOUNTER — Other Ambulatory Visit: Payer: Self-pay | Admitting: Pharmacist

## 2023-05-19 NOTE — Progress Notes (Signed)
   05/19/2023  Patient ID: [name], male   DOB: Jun 16, 1955, 68 y.o.   MRN: [id number]  Called and spoke with the patient on the phone today. Reports he is currently in the car and unable to complete visit during this time. Requesting in-person visit instead. Requested office visit next Wednesday at [facility] and will bring medications he is currently taking.  Goal of optimizing regimen and having a better understanding of what he takes.   Appointment timing rescheduled.  Update from 05/26/23: - Visit re-scheduled to next Tuesday as will be getting injection tomorrow - Send message to staff member about saving location for visit  Update from 05/29/23:  - Called and spoke with nurse Marigene Shoulder- said I can just come into the office and let them know I'm here on Tuesday at [facility] for in-person patient visit - Will place me in one of the patient appt rooms   Delvin File, PharmD [facility]  Phone Number: [phone]

## 2023-05-21 ENCOUNTER — Ambulatory Visit: Admitting: Licensed Clinical Social Worker

## 2023-05-21 DIAGNOSIS — F419 Anxiety disorder, unspecified: Secondary | ICD-10-CM

## 2023-05-21 DIAGNOSIS — F32A Depression, unspecified: Secondary | ICD-10-CM

## 2023-05-21 NOTE — BH Specialist Note (Signed)
 Integrated Behavioral Health Follow Up In-Person Visit  MRN: [id number] Name: [name]  Number of Integrated Behavioral Health Clinician visits: Additional Visit  Session Start time: 1330   Session End time: 1430  Total time in minutes: 60   Types of Service: Individual psychotherapy and General Behavioral Integrated Care (BHI)   Interpretor:No. Interpretor Name and Language: N/A   Subjective: [name] is a 68 y.o. male  Patient was referred by PCP for [facility]. Patient reports the following symptoms/concerns: session was conducted today with a primary focus on facilitating the patient's understanding of and engagement in his medical care. The patient presented with concerns related to ongoing health issues, and the [facility] provided supportive interventions through reflective listening and clinically appropriate feedback. The patient reported modest improvement in coordination with home health services, indicating enhanced adherence and slight progress in daily functioning.  Additionally, the patient discussed upcoming surgical procedures, demonstrating awareness of treatment planning and anticipated needs. He also noted a reduction in previously reported burning sensations, suggesting a positive response to current interventions. These changes were explored in the context of his broader care goals.  The session concluded with the scheduling of a follow-up appointment. The patient remains engaged in behavioral health services and continues to derive benefit from therapeutic support focused on care navigation and symptom management.       Objective: Mood: Anxious and Depressed and Affect: Appropriate Risk of harm to self or others: No plan to harm self or others       Patient and/or Family's Strengths/Protective Factors: Sense of purpose   Goals Addressed: Patient will:  Reduce symptoms of: anxiety and depression   Increase knowledge and/or ability of: healthy habits    Demonstrate ability to: Increase healthy adjustment to current life circumstances   Progress towards Goals: Ongoing   Interventions: Interventions utilized:  Mindfulness or Management consultant and CBT Cognitive Behavioral Therapy  Patient and/or Family Response: Patient continues to enjoy therapy.   Patient Centered Plan: Patient is on the following Treatment Plan(s): OPT Assessment: Patient currently experiencing Anxiety and Depression due to medical diagnosis..    Patient may benefit from ongoing OPT.   Plan: Follow up with behavioral health clinician on : 05/08 ; In-person   Amie Bald, MSW, LCSW-A She/Her Behavioral Health Clinician [facility]  Internal Medicine Center Direct [phone]  Fax [phone] Main Office Phone: [phone] [address] [place] [place], [place] [address] Website: [facility] Internal Medicine [facility]  [place], [place]  [place]

## 2023-05-26 DIAGNOSIS — R350 Frequency of micturition: Secondary | ICD-10-CM | POA: Diagnosis not present

## 2023-05-26 DIAGNOSIS — J441 Chronic obstructive pulmonary disease with (acute) exacerbation: Secondary | ICD-10-CM | POA: Diagnosis not present

## 2023-05-27 ENCOUNTER — Other Ambulatory Visit: Admitting: Pharmacist

## 2023-05-27 DIAGNOSIS — M5416 Radiculopathy, lumbar region: Secondary | ICD-10-CM | POA: Diagnosis not present

## 2023-06-01 ENCOUNTER — Ambulatory Visit
Admission: RE | Admit: 2023-06-01 | Discharge: 2023-06-01 | Disposition: A | Discharge status: Home / Self Care | Source: Ambulatory Visit | Attending: Emergency Medicine | Admitting: Emergency Medicine

## 2023-06-01 ENCOUNTER — Other Ambulatory Visit: Payer: Self-pay | Admitting: Emergency Medicine

## 2023-06-01 DIAGNOSIS — I2510 Atherosclerotic heart disease of native coronary artery without angina pectoris: Secondary | ICD-10-CM | POA: Diagnosis not present

## 2023-06-01 DIAGNOSIS — R9389 Abnormal findings on diagnostic imaging of other specified body structures: Secondary | ICD-10-CM

## 2023-06-01 DIAGNOSIS — J439 Emphysema, unspecified: Secondary | ICD-10-CM | POA: Diagnosis not present

## 2023-06-01 DIAGNOSIS — I700 Atherosclerosis of aorta: Secondary | ICD-10-CM | POA: Diagnosis not present

## 2023-06-01 DIAGNOSIS — J432 Centrilobular emphysema: Secondary | ICD-10-CM

## 2023-06-02 ENCOUNTER — Other Ambulatory Visit: Payer: Self-pay | Admitting: Pharmacist

## 2023-06-03 NOTE — Progress Notes (Signed)
   06/03/2023  Patient ID: [name], male   DOB: December 14, 1955, 68 y.o.   MRN: [id number]  Met with the patient at the office today for comprehensive medication review. He brought the medications he regularly takes only.  Constant medications are Albuterol  HFA, cetirizine , finasteride, ipratopium nasal spray, lidocaine  patches, metoclopramide  as needed for nausea, omeprazole  daily, Trelegy, and Gemtesa. Imdur  is as needed for chest pains and Prednisone  is every 3 days.  Reports chest pains and headaches are more frequent lately. Advised BP may be high (was at last visit) and should consistently take the Imdur  as it can help with chest pain.   Does not see any benefit with Gemtesa despite taking it for 2 months now.  For pain, not using Butrans patch as it caused dizziness. Plans to discuss Tramadol  at upcoming visit to replace it. Has been using 1 BC powder per day, Aleve Back + Body, Aleve/ibuprofen  OTC to help. Said the shot helped a little bit, but not a whole bunch. Cannot drive with the Butrans patch due to drowsiness when taking.   Discussed some sad thoughts and anxiety towards end of the visit. Reports his sponsor advised him to start taking something for anxiety and saw bottle of Hydroxyzine  was labeled to help with that. Therefore has been taking a half-tablet at bedtime to help if having a sad day. Unknown if grogginess in the morning is from tablet or just morning-time in general. Advised I would recommend him continuing this if it helps. Biggest benefit would likely be with CBT. (Had Escitalopram  on his med list, but does not report taking currently)  Plan:  - Patient will discuss Butrans vs Tramadol  concerns at upcoming visit - Advised to restart Rosuvastatin   - Will ask PCP about sending in refill for Hydroxyzine  to the pharmacy - Also ask PCP about sending BP device to [employer] so patient can get for free potentially - Contact Urology regarding Gemtesa  inefficacy- max effect would be at 3 months - Reports potential inguinal and umbilical hernia- schedule visit with PCP today regarding concerns   Delvin File, PharmD [place]  Phone Number: [phone]

## 2023-06-04 ENCOUNTER — Ambulatory Visit: Admitting: Internal Medicine

## 2023-06-04 VITALS — BP 155/81 | HR 52 | Temp 97.6°F | Ht 69.0 in | Wt 155.4 lb

## 2023-06-04 DIAGNOSIS — K429 Umbilical hernia without obstruction or gangrene: Secondary | ICD-10-CM

## 2023-06-04 DIAGNOSIS — F419 Anxiety disorder, unspecified: Secondary | ICD-10-CM

## 2023-06-04 DIAGNOSIS — G8929 Other chronic pain: Secondary | ICD-10-CM

## 2023-06-04 DIAGNOSIS — R300 Dysuria: Secondary | ICD-10-CM | POA: Diagnosis not present

## 2023-06-04 DIAGNOSIS — I10 Essential (primary) hypertension: Secondary | ICD-10-CM | POA: Diagnosis not present

## 2023-06-04 DIAGNOSIS — F32A Depression, unspecified: Secondary | ICD-10-CM

## 2023-06-04 DIAGNOSIS — R350 Frequency of micturition: Secondary | ICD-10-CM | POA: Diagnosis not present

## 2023-06-04 DIAGNOSIS — R519 Headache, unspecified: Secondary | ICD-10-CM

## 2023-06-04 LAB — POCT URINALYSIS DIPSTICK
Bilirubin, UA: NEGATIVE
Blood, UA: NEGATIVE
Glucose, UA: NEGATIVE
Ketones, UA: NEGATIVE
Leukocytes, UA: NEGATIVE
Nitrite, UA: NEGATIVE
Protein, UA: POSITIVE — AB
Spec Grav, UA: >=1.03 — AB (ref 1.010–1.025)
Urobilinogen, UA: 0.2 U/dL
pH, UA: 6 (ref 5.0–8.0)

## 2023-06-04 MED ORDER — KETOROLAC TROMETHAMINE 30 MG/ML IJ SOLN
30.0000 mg | Freq: Once | INTRAMUSCULAR | Status: AC
  Administered 2023-06-04: 30 mg via INTRAMUSCULAR

## 2023-06-04 MED ORDER — HYDROXYZINE HCL 25 MG PO TABS: 25.0000 mg | ORAL_TABLET | Freq: Four times a day (QID) | ORAL | 1 refills | Status: AC | PRN

## 2023-06-04 MED ORDER — ESCITALOPRAM OXALATE 10 MG PO TABS
10.0000 mg | ORAL_TABLET | Freq: Every day | ORAL | 2 refills | Status: DC
Start: 2023-06-04 — End: 2023-10-19

## 2023-06-04 MED ORDER — ROSUVASTATIN CALCIUM 20 MG PO TABS: 20.0000 mg | ORAL_TABLET | Freq: Every day | ORAL | 2 refills | Status: DC

## 2023-06-04 MED ORDER — LOSARTAN POTASSIUM 25 MG PO TABS: 12.5000 mg | ORAL_TABLET | Freq: Every day | ORAL | 1 refills | Status: DC

## 2023-06-04 NOTE — Telephone Encounter (Signed)
 Spoke with the patient.  He has another appointment today @ the [facility] office and is unsure if he will be able to make his appointment, but will call back if he can not make his appt today. 06/04/2023 with Dr. Rozelle Corning.  Copied from CRM [id number]. Topic: Appointments - Appointment Info/Confirmation >> Jun 03, 2023  3:59 PM Tisa Forester wrote: Patient/patient representative is calling for information regarding an appointment. >> Jun 03, 2023  4:02 PM Tisa Forester wrote: Patient return call from office to confirm appointment

## 2023-06-04 NOTE — Patient Instructions (Addendum)
 It was a pleasure to care for you today!  I have sent refills of: -Lexapro  10 mg daily for anxiety -Hydroxyzine  25 mg every 8 hours as needed for breakthrough anxiety -Rosuvastatin  20 mg daily for high cholesterol -Losartan  12.5 mg daily for high blood pressure  Please pick up Qulipta for your migraines. A prior authorization was faxed over to [facility].   I will place a referral to general surgery to discuss your hernias with you. You will receive a call from their office to schedule this appointment.   Please follow up in 4 weeks for blood pressure recheck.  My best, Dr. Rozelle Corning
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
## 2023-06-05 MED ORDER — BLOOD PRESSURE CUFF MISC: 1.0000 | Freq: Once | 0 refills | Status: AC

## 2023-06-05 NOTE — Assessment & Plan Note (Signed)
 Plan: Gave toradol  30 mg IM today given severe headache. Advised that patient contact [facility] pharmacy as I see in his chart that a PA for [name] was approved.

## 2023-06-05 NOTE — Assessment & Plan Note (Signed)
 Patient reports ongoing urinary frequency, pain/burning with urination despite medical therapies prescribed thus far. He has had some chills and nausea but no measured fever and no vomiting.  Assessment:Given dysuria, urine dipstick was obtained which was not concerning for acute cystitis. Plan:Advised patient to follow up with his urologist regarding ongoing urinary tract concerns.

## 2023-06-05 NOTE — Assessment & Plan Note (Addendum)
 BP 146/76, on recheck 155/81. He does not currently take medications for blood pressure as he has not felt well due to anti-hypertensive medications in the past. He is in agreement to start low dose medication and gradually increase as needed. Renal function WNL 05/2023. Plan:Will start losartan  12.5 mg daily and f/u in 4 weeks for BP recheck and further titration as indicated. I will send a prescription for a home BP cuff to [facility] as requested so that he can check his BP at home daily.

## 2023-06-05 NOTE — Assessment & Plan Note (Addendum)
 Plan: Refill for escitalopram  10 mg daily (patient request) and hydroxyzine  25 mg TID PRN anxiety sent in. F/u in 4 weeks for titration as indicated.

## 2023-06-05 NOTE — Assessment & Plan Note (Signed)
 Patient has persistent small umbilical hernia that is reducible and tender, though notes gradual lessening of the pain. He also has concerns about a "lump" in the R groin that comes and goes and is also tender, not only when the lump is there but when it is not as well. He is still having bowel movements and passing gas.  Assessment:No palpable inguinal mass on exam. I am not acutely worried about incarcerated bowel or surgical emergency in regards to possible inguinal hernia and umbilical hernia.  Plan:Will place surgery referral for further assessment and recommendations for further management.

## 2023-06-08 ENCOUNTER — Emergency Department (HOSPITAL_COMMUNITY)

## 2023-06-08 ENCOUNTER — Other Ambulatory Visit: Payer: Self-pay

## 2023-06-08 ENCOUNTER — Inpatient Hospital Stay (HOSPITAL_COMMUNITY)
Admission: EM | Admit: 2023-06-08 | Discharge: 2023-06-10 | DRG: 871 | Disposition: A | Discharge status: Home / Self Care | Attending: Internal Medicine | Admitting: Internal Medicine

## 2023-06-08 ENCOUNTER — Encounter (HOSPITAL_COMMUNITY): Payer: Self-pay | Admitting: Radiology

## 2023-06-08 DIAGNOSIS — Z87891 Personal history of nicotine dependence: Secondary | ICD-10-CM

## 2023-06-08 DIAGNOSIS — I700 Atherosclerosis of aorta: Secondary | ICD-10-CM | POA: Diagnosis not present

## 2023-06-08 DIAGNOSIS — Z85118 Personal history of other malignant neoplasm of bronchus and lung: Secondary | ICD-10-CM | POA: Diagnosis not present

## 2023-06-08 DIAGNOSIS — J309 Allergic rhinitis, unspecified: Secondary | ICD-10-CM | POA: Diagnosis present

## 2023-06-08 DIAGNOSIS — Z85528 Personal history of other malignant neoplasm of kidney: Secondary | ICD-10-CM | POA: Diagnosis not present

## 2023-06-08 DIAGNOSIS — J159 Unspecified bacterial pneumonia: Secondary | ICD-10-CM | POA: Diagnosis present

## 2023-06-08 DIAGNOSIS — Z79899 Other long term (current) drug therapy: Secondary | ICD-10-CM | POA: Diagnosis not present

## 2023-06-08 DIAGNOSIS — Z1152 Encounter for screening for COVID-19: Secondary | ICD-10-CM | POA: Diagnosis not present

## 2023-06-08 DIAGNOSIS — N401 Enlarged prostate with lower urinary tract symptoms: Secondary | ICD-10-CM | POA: Diagnosis present

## 2023-06-08 DIAGNOSIS — J9621 Acute and chronic respiratory failure with hypoxia: Secondary | ICD-10-CM | POA: Diagnosis present

## 2023-06-08 DIAGNOSIS — R079 Chest pain, unspecified: Secondary | ICD-10-CM | POA: Diagnosis not present

## 2023-06-08 DIAGNOSIS — I2510 Atherosclerotic heart disease of native coronary artery without angina pectoris: Secondary | ICD-10-CM | POA: Diagnosis present

## 2023-06-08 DIAGNOSIS — J470 Bronchiectasis with acute lower respiratory infection: Secondary | ICD-10-CM | POA: Diagnosis not present

## 2023-06-08 DIAGNOSIS — B9789 Other viral agents as the cause of diseases classified elsewhere: Secondary | ICD-10-CM | POA: Diagnosis present

## 2023-06-08 DIAGNOSIS — Z515 Encounter for palliative care: Secondary | ICD-10-CM | POA: Diagnosis not present

## 2023-06-08 DIAGNOSIS — Z9981 Dependence on supplemental oxygen: Secondary | ICD-10-CM | POA: Diagnosis not present

## 2023-06-08 DIAGNOSIS — I252 Old myocardial infarction: Secondary | ICD-10-CM

## 2023-06-08 DIAGNOSIS — I10 Essential (primary) hypertension: Secondary | ICD-10-CM | POA: Diagnosis present

## 2023-06-08 DIAGNOSIS — R059 Cough, unspecified: Secondary | ICD-10-CM | POA: Diagnosis not present

## 2023-06-08 DIAGNOSIS — J189 Pneumonia, unspecified organism: Secondary | ICD-10-CM | POA: Diagnosis not present

## 2023-06-08 DIAGNOSIS — Z905 Acquired absence of kidney: Secondary | ICD-10-CM | POA: Diagnosis not present

## 2023-06-08 DIAGNOSIS — K219 Gastro-esophageal reflux disease without esophagitis: Secondary | ICD-10-CM | POA: Diagnosis not present

## 2023-06-08 DIAGNOSIS — E785 Hyperlipidemia, unspecified: Secondary | ICD-10-CM | POA: Diagnosis present

## 2023-06-08 DIAGNOSIS — Z66 Do not resuscitate: Secondary | ICD-10-CM | POA: Diagnosis not present

## 2023-06-08 DIAGNOSIS — A419 Sepsis, unspecified organism: Secondary | ICD-10-CM | POA: Diagnosis not present

## 2023-06-08 DIAGNOSIS — J439 Emphysema, unspecified: Secondary | ICD-10-CM | POA: Diagnosis not present

## 2023-06-08 DIAGNOSIS — J440 Chronic obstructive pulmonary disease with acute lower respiratory infection: Secondary | ICD-10-CM | POA: Diagnosis not present

## 2023-06-08 DIAGNOSIS — F32A Depression, unspecified: Secondary | ICD-10-CM | POA: Diagnosis present

## 2023-06-08 DIAGNOSIS — R042 Hemoptysis: Secondary | ICD-10-CM | POA: Diagnosis not present

## 2023-06-08 DIAGNOSIS — E872 Acidosis, unspecified: Secondary | ICD-10-CM | POA: Diagnosis present

## 2023-06-08 DIAGNOSIS — R918 Other nonspecific abnormal finding of lung field: Secondary | ICD-10-CM | POA: Diagnosis not present

## 2023-06-08 DIAGNOSIS — R0602 Shortness of breath: Secondary | ICD-10-CM | POA: Diagnosis not present

## 2023-06-08 DIAGNOSIS — R531 Weakness: Secondary | ICD-10-CM | POA: Diagnosis not present

## 2023-06-08 DIAGNOSIS — F419 Anxiety disorder, unspecified: Secondary | ICD-10-CM | POA: Diagnosis present

## 2023-06-08 HISTORY — DX: Sepsis, unspecified organism: A41.9

## 2023-06-08 LAB — COMPREHENSIVE METABOLIC PANEL WITH GFR
ALT: 11 U/L (ref 0–44)
AST: 17 U/L (ref 15–41)
Albumin: 3.3 g/dL — ABNORMAL LOW (ref 3.5–5.0)
Alkaline Phosphatase: 103 U/L (ref 38–126)
Anion gap: 11 (ref 5–15)
BUN: 10 mg/dL (ref 8–23)
CO2: 20 mmol/L — ABNORMAL LOW (ref 22–32)
Calcium: 8.3 mg/dL — ABNORMAL LOW (ref 8.9–10.3)
Chloride: 107 mmol/L (ref 98–111)
Creatinine, Ser: 1.05 mg/dL (ref 0.61–1.24)
GFR, Estimated: >60 mL/min (ref >60)
Glucose, Bld: 110 mg/dL — ABNORMAL HIGH (ref 70–99)
Potassium: 3.7 mmol/L (ref 3.5–5.1)
Sodium: 138 mmol/L (ref 135–145)
Total Bilirubin: 0.8 mg/dL (ref 0.0–1.2)
Total Protein: 6.6 g/dL (ref 6.5–8.1)

## 2023-06-08 LAB — CBC WITH DIFFERENTIAL/PLATELET
Abs Immature Granulocytes: 0.28 10*3/uL — ABNORMAL HIGH (ref 0.00–0.07)
Basophils Absolute: 0.1 10*3/uL (ref 0.0–0.1)
Basophils Relative: 0 %
Eosinophils Absolute: 0 10*3/uL (ref 0.0–0.5)
Eosinophils Relative: 0 %
HCT: 43.7 % (ref 39.0–52.0)
Hemoglobin: 13.9 g/dL (ref 13.0–17.0)
Immature Granulocytes: 1 %
Lymphocytes Relative: 3 %
Lymphs Abs: 0.8 10*3/uL (ref 0.7–4.0)
MCH: 29.9 pg (ref 26.0–34.0)
MCHC: 31.8 g/dL (ref 30.0–36.0)
MCV: 94 fL (ref 80.0–100.0)
Monocytes Absolute: 1.5 10*3/uL — ABNORMAL HIGH (ref 0.1–1.0)
Monocytes Relative: 5 %
Neutro Abs: 25.2 10*3/uL — ABNORMAL HIGH (ref 1.7–7.7)
Neutrophils Relative %: 91 %
Platelets: 380 10*3/uL (ref 150–400)
RBC: 4.65 MIL/uL (ref 4.22–5.81)
RDW: 15.4 % (ref 11.5–15.5)
WBC: 27.8 10*3/uL — ABNORMAL HIGH (ref 4.0–10.5)
nRBC: 0 % (ref 0.0–0.2)

## 2023-06-08 LAB — RESP PANEL BY RT-PCR (RSV, FLU A&B, COVID)  RVPGX2
Influenza A by PCR: NEGATIVE
Influenza B by PCR: NEGATIVE
Resp Syncytial Virus by PCR: NEGATIVE
SARS Coronavirus 2 by RT PCR: NEGATIVE

## 2023-06-08 LAB — TROPONIN I (HIGH SENSITIVITY)
Troponin I (High Sensitivity): 7 ng/L (ref <18)
Troponin I (High Sensitivity): 7 ng/L (ref <18)

## 2023-06-08 LAB — HIV ANTIBODY (ROUTINE TESTING W REFLEX): HIV Screen 4th Generation wRfx: NONREACTIVE

## 2023-06-08 LAB — MRSA NEXT GEN BY PCR, NASAL: MRSA by PCR Next Gen: NOT DETECTED

## 2023-06-08 LAB — LACTIC ACID, PLASMA
Lactic Acid, Venous: 1.3 mmol/L (ref 0.5–1.9)
Lactic Acid, Venous: 3.4 mmol/L (ref 0.5–1.9)

## 2023-06-08 LAB — BRAIN NATRIURETIC PEPTIDE: B Natriuretic Peptide: 138.1 pg/mL — ABNORMAL HIGH (ref 0.0–100.0)

## 2023-06-08 MED ORDER — LOSARTAN POTASSIUM 25 MG PO TABS
12.5000 mg | ORAL_TABLET | Freq: Every day | ORAL | Status: DC
  Administered 2023-06-09 – 2023-06-10 (×2): 12.5 mg via ORAL
  Filled 2023-06-08 (×2): qty 0.5

## 2023-06-08 MED ORDER — HYDROCODONE BIT-HOMATROP MBR 5-1.5 MG/5ML PO SOLN
5.0000 mL | Freq: Once | ORAL | Status: AC
  Administered 2023-06-08: 5 mL via ORAL
  Filled 2023-06-08: qty 5

## 2023-06-08 MED ORDER — SODIUM CHLORIDE 0.9 % IV SOLN
2.0000 g | Freq: Once | INTRAVENOUS | Status: AC
  Administered 2023-06-08: 2 g via INTRAVENOUS
  Filled 2023-06-08: qty 12.5

## 2023-06-08 MED ORDER — VANCOMYCIN HCL IN DEXTROSE 1-5 GM/200ML-% IV SOLN
1000.0000 mg | Freq: Once | INTRAVENOUS | Status: AC
  Administered 2023-06-08: 1000 mg via INTRAVENOUS
  Filled 2023-06-08: qty 200

## 2023-06-08 MED ORDER — KETOROLAC TROMETHAMINE 15 MG/ML IJ SOLN
15.0000 mg | Freq: Three times a day (TID) | INTRAMUSCULAR | Status: DC | PRN
  Administered 2023-06-08 – 2023-06-10 (×4): 15 mg via INTRAVENOUS
  Filled 2023-06-08 (×4): qty 1

## 2023-06-08 MED ORDER — FINASTERIDE 5 MG PO TABS
5.0000 mg | ORAL_TABLET | Freq: Every day | ORAL | Status: DC
  Administered 2023-06-08 – 2023-06-10 (×3): 5 mg via ORAL
  Filled 2023-06-08 (×3): qty 1

## 2023-06-08 MED ORDER — IPRATROPIUM-ALBUTEROL 0.5-2.5 (3) MG/3ML IN SOLN: 3.0000 mL | Freq: Four times a day (QID) | RESPIRATORY_TRACT | Status: DC | PRN

## 2023-06-08 MED ORDER — FLUTICASONE PROPIONATE 50 MCG/ACT NA SUSP: 2.0000 | Freq: Two times a day (BID) | NASAL | Status: DC | PRN

## 2023-06-08 MED ORDER — LORATADINE 10 MG PO TABS
10.0000 mg | ORAL_TABLET | Freq: Every day | ORAL | Status: DC
  Administered 2023-06-08 – 2023-06-10 (×3): 10 mg via ORAL
  Filled 2023-06-08 (×3): qty 1

## 2023-06-08 MED ORDER — ESCITALOPRAM OXALATE 10 MG PO TABS
10.0000 mg | ORAL_TABLET | Freq: Every day | ORAL | Status: DC
  Administered 2023-06-08 – 2023-06-10 (×3): 10 mg via ORAL
  Filled 2023-06-08 (×3): qty 1

## 2023-06-08 MED ORDER — ACETAMINOPHEN 325 MG PO TABS
650.0000 mg | ORAL_TABLET | Freq: Four times a day (QID) | ORAL | Status: DC | PRN
  Administered 2023-06-08 – 2023-06-09 (×4): 650 mg via ORAL
  Filled 2023-06-08 (×4): qty 2

## 2023-06-08 MED ORDER — SODIUM CHLORIDE 0.9 % IV SOLN
1.0000 g | INTRAVENOUS | Status: DC
  Administered 2023-06-08 – 2023-06-09 (×2): 1 g via INTRAVENOUS
  Filled 2023-06-08 (×2): qty 10

## 2023-06-08 MED ORDER — PANTOPRAZOLE SODIUM 40 MG PO TBEC
40.0000 mg | DELAYED_RELEASE_TABLET | Freq: Every day | ORAL | Status: DC
  Administered 2023-06-08 – 2023-06-10 (×3): 40 mg via ORAL
  Filled 2023-06-08 (×3): qty 1

## 2023-06-08 MED ORDER — MIRABEGRON ER 25 MG PO TB24
25.0000 mg | ORAL_TABLET | Freq: Every day | ORAL | Status: DC
  Administered 2023-06-08 – 2023-06-10 (×3): 25 mg via ORAL
  Filled 2023-06-08 (×3): qty 1

## 2023-06-08 MED ORDER — SODIUM CHLORIDE 0.9 % IV SOLN
500.0000 mg | INTRAVENOUS | Status: DC
  Administered 2023-06-08 – 2023-06-09 (×2): 500 mg via INTRAVENOUS
  Filled 2023-06-08 (×2): qty 5

## 2023-06-08 MED ORDER — LACTATED RINGERS IV SOLN: INTRAVENOUS | Status: DC

## 2023-06-08 MED ORDER — ACETAMINOPHEN 650 MG RE SUPP: 650.0000 mg | Freq: Four times a day (QID) | RECTAL | Status: DC | PRN

## 2023-06-08 MED ORDER — ONDANSETRON HCL 4 MG PO TABS
4.0000 mg | ORAL_TABLET | Freq: Four times a day (QID) | ORAL | Status: DC | PRN
  Administered 2023-06-08: 4 mg via ORAL
  Filled 2023-06-08: qty 1

## 2023-06-08 MED ORDER — IPRATROPIUM BROMIDE 0.03 % NA SOLN
2.0000 | Freq: Every day | NASAL | Status: DC
  Administered 2023-06-08 – 2023-06-10 (×3): 2 via NASAL
  Filled 2023-06-08: qty 30

## 2023-06-08 MED ORDER — BUDESON-GLYCOPYRROL-FORMOTEROL 160-9-4.8 MCG/ACT IN AERO
2.0000 | INHALATION_SPRAY | Freq: Two times a day (BID) | RESPIRATORY_TRACT | Status: DC
  Administered 2023-06-08 – 2023-06-10 (×4): 2 via RESPIRATORY_TRACT
  Filled 2023-06-08: qty 5.9

## 2023-06-08 MED ORDER — IOHEXOL 300 MG/ML  SOLN
100.0000 mL | Freq: Once | INTRAMUSCULAR | Status: AC | PRN
  Administered 2023-06-08: 100 mL via INTRAVENOUS

## 2023-06-08 MED ORDER — KETOROLAC TROMETHAMINE 15 MG/ML IJ SOLN
15.0000 mg | Freq: Once | INTRAMUSCULAR | Status: AC
  Administered 2023-06-08: 15 mg via INTRAVENOUS
  Filled 2023-06-08: qty 1

## 2023-06-08 MED ORDER — ENOXAPARIN SODIUM 40 MG/0.4ML IJ SOSY
40.0000 mg | PREFILLED_SYRINGE | Freq: Every day | INTRAMUSCULAR | Status: DC
  Filled 2023-06-08: qty 0.4

## 2023-06-08 MED ORDER — SODIUM CHLORIDE (PF) 0.9 % IJ SOLN
INTRAMUSCULAR | Status: AC
  Filled 2023-06-08: qty 50

## 2023-06-08 MED ORDER — LIDOCAINE 5 % EX PTCH
1.0000 | MEDICATED_PATCH | CUTANEOUS | Status: DC
  Filled 2023-06-08 (×2): qty 1

## 2023-06-08 MED ORDER — LOSARTAN POTASSIUM 25 MG PO TABS: 12.5000 mg | ORAL_TABLET | Freq: Every day | ORAL | Status: DC

## 2023-06-08 MED ORDER — METRONIDAZOLE 500 MG/100ML IV SOLN
500.0000 mg | Freq: Once | INTRAVENOUS | Status: AC
  Administered 2023-06-08: 500 mg via INTRAVENOUS
  Filled 2023-06-08: qty 100

## 2023-06-08 MED ORDER — PREDNISONE 10 MG PO TABS
10.0000 mg | ORAL_TABLET | Freq: Every day | ORAL | Status: DC
  Administered 2023-06-09 – 2023-06-10 (×2): 10 mg via ORAL
  Filled 2023-06-08 (×2): qty 1

## 2023-06-08 MED ORDER — SODIUM CHLORIDE 0.9 % IV BOLUS
1000.0000 mL | Freq: Once | INTRAVENOUS | Status: AC
  Administered 2023-06-08: 1000 mL via INTRAVENOUS

## 2023-06-08 MED ORDER — ONDANSETRON HCL 4 MG/2ML IJ SOLN
4.0000 mg | Freq: Four times a day (QID) | INTRAMUSCULAR | Status: DC | PRN
  Administered 2023-06-09: 4 mg via INTRAVENOUS
  Filled 2023-06-08: qty 2

## 2023-06-08 MED ORDER — SENNOSIDES-DOCUSATE SODIUM 8.6-50 MG PO TABS: 1.0000 | ORAL_TABLET | Freq: Every evening | ORAL | Status: DC | PRN

## 2023-06-08 NOTE — ED Triage Notes (Signed)
 Pt BIB  EMS coming from home. C/o of shob, coughing up dark sputum. Having pain to right side. Baseline [place] 2L.  Hx: pneumonia.   EMS:  122/78, HR 96, RR 27, Temp 97.3, En tidal 22 20g Left forearm, albuterol , atrovent  125 solumedrol, zofran  given en route

## 2023-06-08 NOTE — ED Provider Notes (Incomplete)
 69 yo/m on [facility] normally daily here with cough/cp/fever/chills/arthralgias over the last week. Sick exposure with child with URI. Symptoms worse in last 24 hours. Productive cough with frothy/blood tinged sputum, worsening body aches, exertional dib. Not really improved with otc cold/flu medicine. He is having intermittent cp with coughing. Coarse breath sounds noted on auscultation R >L, HR >90. No LE edema.

## 2023-06-08 NOTE — Sepsis Progress Note (Signed)
 Sepsis protocol is being followed by [facility].

## 2023-06-08 NOTE — Progress Notes (Signed)
 Internal Medicine Clinic Attending  Case discussed with the resident at the time of the visit.  We reviewed the resident's history and exam and pertinent patient test results.  I agree with the assessment, diagnosis, and plan of care documented in the resident's note.

## 2023-06-08 NOTE — Progress Notes (Signed)
 This RN notified Sharion Davidson, NP of lactic 3.4. Care plan continued.
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
## 2023-06-08 NOTE — ED Provider Notes (Cosign Needed Addendum)
 [place] EMERGENCY DEPARTMENT AT [facility] Provider Note   CSN: [id number] Arrival date & time: 06/08/23  0900     History COPD, lung cancer s/p portion of L lung removed Chief Complaint  Patient presents with   Shortness of Breath   Pneumonia    [name] is a 68 y.o. male.  Pt presents with productive cough with dark red sputum, SOB, fevers and chills, nausea, diarrhea, generally feeling unwell for past week, symptoms worsened last night. Still eating and drinking.  Recently exposed to a sick child who is coughing. Has COPD, uses 2 L O2 at baseline    Shortness of Breath Pneumonia Associated symptoms include shortness of breath.        Home Medications Prior to Admission medications   Medication Sig Start Date End Date Taking? Authorizing Provider  albuterol  (VENTOLIN  HFA) 108 (90 Base) MCG/ACT inhaler INHALE 2 PUFFS BY MOUTH EVERY 6 HOURS AS NEEDED FOR WHEEZING FOR SHORTNESS OF BREATH 06/01/23  Yes Byrum, Delora Ferry, MD  buprenorphine (BUTRANS) 7.5 MCG/HR Place 1 patch onto the skin once a week.   Yes [provider]  cetirizine  (ZYRTEC  ALLERGY) 10 MG tablet Take 1 tablet (10 mg total) by mouth daily. 04/16/23 04/15/24 Yes Juberg, Christopher, DO  escitalopram  (LEXAPRO ) 10 MG tablet Take 1 tablet (10 mg total) by mouth daily. 06/04/23 06/03/24 Yes Malen Scudder, DO  finasteride (PROSCAR) 5 MG tablet Take 5 mg by mouth daily.   Yes [provider]  fluticasone  (FLONASE ) 50 MCG/ACT nasal spray Use 2 spray(s) in each nostril once daily Patient taking differently: Place 2 sprays into both nostrils 2 (two) times daily as needed for allergies or rhinitis. 03/04/23  Yes Jayson Michael, MD  ipratropium (ATROVENT ) 0.03 % nasal spray Place 2 sprays into both nostrils every 12 (twelve) hours. Patient taking differently: Place 2 sprays into both nostrils daily. 05/07/23  Yes Byrum, Robert S, MD  isosorbide  mononitrate (IMDUR ) 60 MG 24 hr tablet Take 1 tablet  (60 mg total) by mouth daily. Patient taking differently: Take 60 mg by mouth daily as needed (Chest Pain). 07/29/22 06/08/23 Yes Monge, Nelva Bang, NP  lidocaine  (LIDODERM ) 5 % Place 1 patch unto chest wall. Remove & Discard patch within 12 hours. 07/21/22  Yes Vita Grip, MD  losartan  (COZAAR ) 25 MG tablet Take 0.5 tablets (12.5 mg total) by mouth daily. 06/04/23  Yes Malen Scudder, DO  metoCLOPramide  (REGLAN ) 5 MG tablet Take 5 mg by mouth every 8 (eight) hours as needed for nausea.   Yes [provider]  nitroGLYCERIN  (NITROSTAT ) 0.4 MG SL tablet Place 1 tablet (0.4 mg total) under the tongue every 5 (five) minutes as needed for chest pain. 08/19/21 06/08/23 Yes Monge, Nelva Bang, NP  omeprazole  (PRILOSEC) 40 MG capsule Take 1 capsule (40 mg total) by mouth daily. 11/05/22  Yes Tawkaliyar, Roya, DO  predniSONE  (DELTASONE ) 10 MG tablet Take 1 tablet (10 mg total) by mouth daily with breakfast. 03/04/23  Yes Denson Flake, MD  TRELEGY ELLIPTA  200-62.5-25 MCG/ACT AEPB INHALE 1 PUFF ONCE DAILY 06/01/23  Yes Byrum, Robert S, MD  Vibegron (GEMTESA) 75 MG TABS Take 75 mg by mouth daily.   Yes [provider]  buprenorphine (BUTRANS) 5 MCG/HR PTWK Place 1 patch onto the skin once a week. Patient not taking: Reported on 06/02/2023 03/21/23   [provider]  hydrOXYzine  (ATARAX ) 25 MG tablet Take 1 tablet (25 mg total) by mouth every 6 (six) hours  as needed for anxiety. Patient not taking: Reported on 06/08/2023 06/04/23   Malen Scudder, DO  OXYGEN  Inhale 2 L into the lungs continuous.    [provider]  predniSONE  (DELTASONE ) 20 MG tablet Take 40 mg by mouth every morning. Patient not taking: Reported on 06/08/2023 04/21/23   [provider]  QULIPTA 30 MG TABS Take 1 tablet by mouth daily. Patient not taking: Reported on 06/08/2023    [provider]  rosuvastatin  (CRESTOR ) 20 MG tablet Take 1 tablet (20 mg total) by mouth daily. Patient not taking: Reported on  06/08/2023 06/04/23   Malen Scudder, DO      Allergies    Patient has no known allergies.    Review of Systems   Review of Systems  Respiratory:  Positive for shortness of breath.   As in HPI  Physical Exam Updated Vital Signs BP 120/78   Pulse 72   Temp 98.6 F (37 C) (Oral)   Resp 20   Ht 5\' 9"  (1.753 m)   Wt 70.3 kg   SpO2 94%   BMI 22.89 kg/m  Physical Exam Constitutional:      General: He is not in acute distress.    Appearance: He is ill-appearing.  HENT:     Mouth/Throat:     Mouth: Mucous membranes are moist.     Pharynx: No pharyngeal swelling or oropharyngeal exudate.  Cardiovascular:     Rate and Rhythm: Normal rate and regular rhythm.     Heart sounds: No murmur heard. Pulmonary:     Effort: Pulmonary effort is normal.     Comments: Crackles at lower lobes and diminished  Abdominal:     General: Bowel sounds are normal.     Palpations: Abdomen is soft.     Tenderness: There is no abdominal tenderness.  Musculoskeletal:     Cervical back: Neck supple.     Right lower leg: No edema.     Left lower leg: No edema.  Skin:    General: Skin is warm and dry.  Neurological:     General: No focal deficit present.     Mental Status: He is alert.  Psychiatric:        Mood and Affect: Mood normal.        Behavior: Behavior normal.     ED Results / Procedures / Treatments   Labs (all labs ordered are listed, but only abnormal results are displayed) Labs Reviewed  BRAIN NATRIURETIC PEPTIDE - Abnormal; Notable for the following components:      Result Value   B Natriuretic Peptide 138.1 (*)    All other components within normal limits  CBC WITH DIFFERENTIAL/PLATELET - Abnormal; Notable for the following components:   WBC 27.8 (*)    Neutro Abs 25.2 (*)    Monocytes Absolute 1.5 (*)    Abs Immature Granulocytes 0.28 (*)    All other components within normal limits  RESP PANEL BY RT-PCR (RSV, FLU A&B, COVID)  [id number]  CULTURE, BLOOD (ROUTINE X 2)  CULTURE,  BLOOD (ROUTINE X 2)  LACTIC ACID, PLASMA  LACTIC ACID, PLASMA  COMPREHENSIVE METABOLIC PANEL WITH GFR  TROPONIN I (HIGH SENSITIVITY)  TROPONIN I (HIGH SENSITIVITY)    EKG EKG Interpretation Date/Time:  Monday Jun 08 2023 09:20:01 EDT Ventricular Rate:  96 PR Interval:  118 QRS Duration:  89 QT Interval:  343 QTC Calculation: 434 R Axis:   -74  Text Interpretation: Sinus rhythm Borderline short PR interval Left anterior  fascicular block Borderline T wave abnormalities Confirmed by Russella Courts ([id number]) on 06/08/2023 10:21:37 AM  Radiology DG Chest 2 View Result Date: 06/08/2023 CLINICAL DATA:  Right-sided chest pain EXAM: CHEST - 2 VIEW COMPARISON:  Chest CT performed June 01, 2023 FINDINGS: Interstitial airspace opacities. Nodules are present in the upper lung zones bilaterally which are similar when compared to the previous CT scan. No pneumothorax or significant pleural effusion. Heart mediastinum are not significantly changed. IMPRESSION: 1. Emphysema. 2. Bilateral pulmonary nodules which are grossly similar when compared to CT performed June 01, 2023. Electronically Signed   By: Reagan Camera M.D.   On: 06/08/2023 10:52    Procedures Procedures  None  Medications Ordered in ED Medications  lactated ringers  infusion (has no administration in time range)  vancomycin (VANCOCIN) IVPB 1000 mg/200 mL premix (has no administration in time range)  ketorolac  (TORADOL ) 15 MG/ML injection 15 mg (15 mg Intravenous Given 06/08/23 1017)  sodium chloride  0.9 % bolus 1,000 mL (1,000 mLs Intravenous New Bag/Given 06/08/23 1019)  HYDROcodone  bit-homatropine (HYCODAN) 5-1.5 MG/5ML syrup 5 mL (5 mLs Oral Given 06/08/23 1015)  ceFEPIme (MAXIPIME) 2 g in sodium chloride  0.9 % 100 mL IVPB (2 g Intravenous New Bag/Given 06/08/23 1144)  metroNIDAZOLE (FLAGYL) IVPB 500 mg (0 mg Intravenous Stopped 06/08/23 1258)    ED Course/ Medical Decision Making/ A&P Clinical Course as of 06/08/23 1558  Mon Jun 08, 2023   1554 CT concerning for possible PNA, continue abx. Plan admit  [SG]    Clinical Course User Index [SG] Teddi Favors, DO                                Medical Decision Making 68 yo males presents with ~ 1 week of respiratory symptoms, diarrhea, fever and chills.  S/p albuterol , Solu-Medrol  and Zofran  via EMS.  In ED, he was was initially tachycardic and tachypneic into the high 20s with SpO2 in low 90s on home O2. Exam significant for mild crackles at lower lobes CBC with differential notable for leukocytosis 0.8, ANC elevated 25.2 Negative COVID, flu, RSV CMP nonconcerning, Trop 7>7, LA nml at 1.5, BNP mildly elevated to 138.1 Blood cultures collected. CXR unremarkable, CT chest abdomen pelvis concerning for R lower lobe PNA  Patient treated with Hycodan, Toradol , 1 L NS bolus, IV cefepime, Flagyl and vanc SOB with productive cough likely in setting of PNA.  Low concern for MI given low trop and no ST changes.  Low concern for PE given no chest pain.  Considered heart failure however BNP only mildly elevated, he does not appear overloaded and no congestion noted on imaging.  Less likely COPD exacerbation given no wheezing on exam. Given patient meets sepsis criteria, has COPD, hx lung cancer with resection, recommend admission for IV abx to treat CAP. Consult to triad admitting hospitalist has been placed.    Amount and/or Complexity of Data Reviewed Labs: ordered. Radiology: ordered.  Risk Prescription drug management. Decision regarding hospitalization.     Final Clinical Impression(s) / ED Diagnoses Final diagnoses:  None    Rx / DC Orders ED Discharge Orders     None      Glenn Lange, DO Family medicine, PGY-3   Glenn Lange, DO 06/08/23 1613    Glenn Lange, DO 06/08/23 1614    Teddi Favors, DO 06/11/23 [phone]

## 2023-06-09 DIAGNOSIS — R059 Cough, unspecified: Secondary | ICD-10-CM | POA: Diagnosis present

## 2023-06-09 DIAGNOSIS — K219 Gastro-esophageal reflux disease without esophagitis: Secondary | ICD-10-CM | POA: Diagnosis present

## 2023-06-09 DIAGNOSIS — Z79899 Other long term (current) drug therapy: Secondary | ICD-10-CM | POA: Diagnosis not present

## 2023-06-09 DIAGNOSIS — F419 Anxiety disorder, unspecified: Secondary | ICD-10-CM | POA: Diagnosis present

## 2023-06-09 DIAGNOSIS — I252 Old myocardial infarction: Secondary | ICD-10-CM | POA: Diagnosis not present

## 2023-06-09 DIAGNOSIS — Z9981 Dependence on supplemental oxygen: Secondary | ICD-10-CM | POA: Diagnosis not present

## 2023-06-09 DIAGNOSIS — N401 Enlarged prostate with lower urinary tract symptoms: Secondary | ICD-10-CM | POA: Diagnosis present

## 2023-06-09 DIAGNOSIS — J440 Chronic obstructive pulmonary disease with acute lower respiratory infection: Secondary | ICD-10-CM | POA: Diagnosis present

## 2023-06-09 DIAGNOSIS — E785 Hyperlipidemia, unspecified: Secondary | ICD-10-CM | POA: Diagnosis present

## 2023-06-09 DIAGNOSIS — I10 Essential (primary) hypertension: Secondary | ICD-10-CM | POA: Diagnosis present

## 2023-06-09 DIAGNOSIS — B9789 Other viral agents as the cause of diseases classified elsewhere: Secondary | ICD-10-CM | POA: Diagnosis present

## 2023-06-09 DIAGNOSIS — Z85118 Personal history of other malignant neoplasm of bronchus and lung: Secondary | ICD-10-CM | POA: Diagnosis not present

## 2023-06-09 DIAGNOSIS — A419 Sepsis, unspecified organism: Secondary | ICD-10-CM | POA: Diagnosis present

## 2023-06-09 DIAGNOSIS — E872 Acidosis, unspecified: Secondary | ICD-10-CM | POA: Diagnosis present

## 2023-06-09 DIAGNOSIS — I2510 Atherosclerotic heart disease of native coronary artery without angina pectoris: Secondary | ICD-10-CM | POA: Diagnosis present

## 2023-06-09 DIAGNOSIS — J189 Pneumonia, unspecified organism: Secondary | ICD-10-CM | POA: Diagnosis not present

## 2023-06-09 DIAGNOSIS — J470 Bronchiectasis with acute lower respiratory infection: Secondary | ICD-10-CM | POA: Diagnosis present

## 2023-06-09 DIAGNOSIS — Z87891 Personal history of nicotine dependence: Secondary | ICD-10-CM | POA: Diagnosis not present

## 2023-06-09 DIAGNOSIS — Z515 Encounter for palliative care: Secondary | ICD-10-CM | POA: Diagnosis not present

## 2023-06-09 DIAGNOSIS — Z1152 Encounter for screening for COVID-19: Secondary | ICD-10-CM | POA: Diagnosis not present

## 2023-06-09 DIAGNOSIS — Z66 Do not resuscitate: Secondary | ICD-10-CM | POA: Diagnosis present

## 2023-06-09 DIAGNOSIS — Z85528 Personal history of other malignant neoplasm of kidney: Secondary | ICD-10-CM | POA: Diagnosis not present

## 2023-06-09 DIAGNOSIS — I700 Atherosclerosis of aorta: Secondary | ICD-10-CM | POA: Diagnosis present

## 2023-06-09 DIAGNOSIS — J9621 Acute and chronic respiratory failure with hypoxia: Secondary | ICD-10-CM | POA: Diagnosis present

## 2023-06-09 DIAGNOSIS — F32A Depression, unspecified: Secondary | ICD-10-CM | POA: Diagnosis present

## 2023-06-09 DIAGNOSIS — J159 Unspecified bacterial pneumonia: Secondary | ICD-10-CM | POA: Diagnosis present

## 2023-06-09 LAB — CBC
HCT: 37 % — ABNORMAL LOW (ref 39.0–52.0)
Hemoglobin: 11.6 g/dL — ABNORMAL LOW (ref 13.0–17.0)
MCH: 29.7 pg (ref 26.0–34.0)
MCHC: 31.4 g/dL (ref 30.0–36.0)
MCV: 94.9 fL (ref 80.0–100.0)
Platelets: 267 10*3/uL (ref 150–400)
RBC: 3.9 MIL/uL — ABNORMAL LOW (ref 4.22–5.81)
RDW: 15 % (ref 11.5–15.5)
WBC: 23.2 10*3/uL — ABNORMAL HIGH (ref 4.0–10.5)
nRBC: 0 % (ref 0.0–0.2)

## 2023-06-09 LAB — BASIC METABOLIC PANEL WITH GFR
Anion gap: 6 (ref 5–15)
BUN: 18 mg/dL (ref 8–23)
CO2: 23 mmol/L (ref 22–32)
Calcium: 8.3 mg/dL — ABNORMAL LOW (ref 8.9–10.3)
Chloride: 105 mmol/L (ref 98–111)
Creatinine, Ser: 0.89 mg/dL (ref 0.61–1.24)
GFR, Estimated: >60 mL/min (ref >60)
Glucose, Bld: 108 mg/dL — ABNORMAL HIGH (ref 70–99)
Potassium: 4.5 mmol/L (ref 3.5–5.1)
Sodium: 134 mmol/L — ABNORMAL LOW (ref 135–145)

## 2023-06-09 LAB — LACTIC ACID, PLASMA: Lactic Acid, Venous: 1.3 mmol/L (ref 0.5–1.9)

## 2023-06-09 LAB — PROCALCITONIN: Procalcitonin: 6.58 ng/mL

## 2023-06-09 LAB — RESPIRATORY PANEL BY PCR

## 2023-06-09 MED ORDER — ENOXAPARIN SODIUM 40 MG/0.4ML IJ SOSY
40.0000 mg | PREFILLED_SYRINGE | INTRAMUSCULAR | Status: DC
  Filled 2023-06-09: qty 0.4

## 2023-06-09 MED ORDER — BISMUTH SUBSALICYLATE 262 MG/15ML PO SUSP
30.0000 mL | Freq: Once | ORAL | Status: AC
  Administered 2023-06-09: 30 mL via ORAL
  Filled 2023-06-09: qty 236

## 2023-06-09 NOTE — Progress Notes (Signed)
   06/09/23 1032  TOC Brief Assessment  Insurance and Status Reviewed  Patient has primary care physician Yes  Home environment has been reviewed apartment  Prior level of function: independent  Prior/Current Home Services No current home services  Social Drivers of Health Review SDOH reviewed no interventions necessary  Readmission risk has been reviewed Yes  Transition of care needs transition of care needs identified, TOC will continue to follow    CSW met with pt at bedside to discuss pt's O2 needs. Pt is on 2L at baseline. Pt receives O2 through [employer]. Pt reports he has a portable oxygen  tank in his room to use upon discharge. TOC will continue to follow.

## 2023-06-09 NOTE — Progress Notes (Signed)
  Progress Note   Patient: [name] ZOX:[id number] DOB: [birth year] DOA: 06/08/2023     0 DOS: the patient was seen and examined on 06/09/2023 at 1:23PM      Brief hospital course: 68 yo M with COPD on home O2, FEV1 50% in 2021, hx RCC, hx lung cancer s/p lobectomy, lung nodules in surveillance by Dr. Baldwin Levee, CAD, and low back pain who presented with cough and right chest pain.  Recently exposed to family member with cough.  In the ER, CT chest showed RLL pneumonia.     Assessment and Plan: Community-acquired pneumonia of the right lower lobe Rhinovirus positive, and positive exposure, however, procalcitonin very elevated, and has fevers chills, so I will treat for bacterial pneumonia - Continue Rocephin  and azithromycin  - Incentive spirometer and flutter valve   COPD Chronic respiratory failure - Continue home prednisone  10 mg daily - Continue Breztri  - Continue fluticasone , bronchodilators, pantoprazole   Lung nodules These are already in surveillance with pulmonology - Follow-up with pulmonology -Patient follows with hospice of the [facility] outpatient  Mood disorder - Continue Lexapro   Hypertension - Continue losartan        Subjective: Patient is still reporting chest pain, shortness of breath.  He is still on 5 L this morning.  Overall tired, wondering about his longevity.     Physical Exam: BP (!) 142/79 (BP Location: Right Arm)   Pulse (!) 56   Temp 97.7 F (36.5 C)   Resp 19   Ht 5\' 9"  (1.753 m)   Wt 70.3 kg   SpO2 96%   BMI 22.89 kg/m   Thin adult male, lying in bed, interactive and appropriate RRR, no murmurs, no peripheral edema Respiratory rate diminished, lung sounds diminished in the right base, wheezing bilaterally Abdomen soft nontender palpation or guarding, no ascites or distention Attention normal, affect normal, judgment insight appear normal  Data Reviewed: Basic metabolic panel shows mild hyponatremia, normal  potassium and creatinine Lactic acid 3.4, cleared overnight Procalcitonin 6.58 White blood cell count down to 23 Hemoglobin 11    Family Communication: None present    Disposition: Status is: Inpatient Patient has severe COPD, FEV1 50%, chronic respiratory failure  He was admitted with right lower lobe pneumonia, and is still on 5 L of oxygen  this morning and still has white count 23 (note steroids)  If his white count comes down, and his oxygen  is able to wean back to baseline 2 L, possibly home tomorrow or the next        Author: Ephriam Hashimoto, MD 06/09/2023 6:17 PM  For on call review [email or web address].

## 2023-06-09 NOTE — Plan of Care (Signed)
   Problem: Education: Goal: Knowledge of General Education information will improve Description: Including pain rating scale, medication(s)/side effects and non-pharmacologic comfort measures Outcome: Progressing   Problem: Clinical Measurements: Goal: Ability to maintain clinical measurements within normal limits will improve Outcome: Progressing   Problem: Clinical Measurements: Goal: Cardiovascular complication will be avoided Outcome: Progressing   Problem: Activity: Goal: Risk for activity intolerance will decrease Outcome: Progressing

## 2023-06-09 NOTE — Progress Notes (Signed)
 Patient had 3 loose bowel movements this shift. On call Sharion Davidson, NP notified. Care plan continued.

## 2023-06-10 ENCOUNTER — Ambulatory Visit: Admitting: Physician Assistant

## 2023-06-10 DIAGNOSIS — A419 Sepsis, unspecified organism: Secondary | ICD-10-CM | POA: Diagnosis not present

## 2023-06-10 DIAGNOSIS — J189 Pneumonia, unspecified organism: Secondary | ICD-10-CM | POA: Diagnosis not present

## 2023-06-10 LAB — COMPREHENSIVE METABOLIC PANEL WITH GFR
ALT: 10 U/L (ref 0–44)
AST: 11 U/L — ABNORMAL LOW (ref 15–41)
Albumin: 2.7 g/dL — ABNORMAL LOW (ref 3.5–5.0)
Alkaline Phosphatase: 75 U/L (ref 38–126)
Anion gap: 6 (ref 5–15)
BUN: 13 mg/dL (ref 8–23)
CO2: 24 mmol/L (ref 22–32)
Calcium: 8.1 mg/dL — ABNORMAL LOW (ref 8.9–10.3)
Chloride: 104 mmol/L (ref 98–111)
Creatinine, Ser: 1.09 mg/dL (ref 0.61–1.24)
GFR, Estimated: >60 mL/min (ref >60)
Glucose, Bld: 89 mg/dL (ref 70–99)
Potassium: 3.9 mmol/L (ref 3.5–5.1)
Sodium: 134 mmol/L — ABNORMAL LOW (ref 135–145)
Total Bilirubin: 0.5 mg/dL (ref 0.0–1.2)
Total Protein: 5.6 g/dL — ABNORMAL LOW (ref 6.5–8.1)

## 2023-06-10 LAB — CBC
HCT: 37.3 % — ABNORMAL LOW (ref 39.0–52.0)
Hemoglobin: 11.4 g/dL — ABNORMAL LOW (ref 13.0–17.0)
MCH: 29.5 pg (ref 26.0–34.0)
MCHC: 30.6 g/dL (ref 30.0–36.0)
MCV: 96.6 fL (ref 80.0–100.0)
Platelets: 284 10*3/uL (ref 150–400)
RBC: 3.86 MIL/uL — ABNORMAL LOW (ref 4.22–5.81)
RDW: 14.8 % (ref 11.5–15.5)
WBC: 16.9 10*3/uL — ABNORMAL HIGH (ref 4.0–10.5)
nRBC: 0 % (ref 0.0–0.2)

## 2023-06-10 MED ORDER — METOPROLOL TARTRATE 5 MG/5ML IV SOLN: 5.0000 mg | INTRAVENOUS | Status: DC | PRN

## 2023-06-10 MED ORDER — GUAIFENESIN 100 MG/5ML PO LIQD: 5.0000 mL | ORAL | Status: DC | PRN

## 2023-06-10 MED ORDER — HYDRALAZINE HCL 20 MG/ML IJ SOLN: 10.0000 mg | INTRAMUSCULAR | Status: DC | PRN

## 2023-06-10 MED ORDER — TRAZODONE HCL 50 MG PO TABS: 50.0000 mg | ORAL_TABLET | Freq: Every evening | ORAL | Status: DC | PRN

## 2023-06-10 MED ORDER — DOXYCYCLINE HYCLATE 100 MG PO CAPS: 100.0000 mg | ORAL_CAPSULE | Freq: Two times a day (BID) | ORAL | 0 refills | Status: AC

## 2023-06-10 MED ORDER — BISMUTH SUBSALICYLATE 262 MG/15ML PO SUSP
30.0000 mL | ORAL | Status: DC | PRN
  Administered 2023-06-10: 30 mL via ORAL

## 2023-06-10 NOTE — Progress Notes (Signed)
 Mobility Specialist - Progress Note   06/10/23 0906  Oxygen  Therapy  SpO2 96 %  O2 Device Nasal Cannula  O2 Flow Rate (L/min) 2 L/min  Mobility  Activity Ambulated independently to bathroom  Level of Assistance Independent after set-up  Assistive Device None  Distance Ambulated (ft) 20 ft  Range of Motion/Exercises Active  Activity Response Tolerated well  Mobility Referral Yes  Mobility visit 1 Mobility  Mobility Specialist Start Time (ACUTE ONLY) [id number]  Mobility Specialist Stop Time (ACUTE ONLY) 0900  Mobility Specialist Time Calculation (min) (ACUTE ONLY) 9 min   Pt received in bed and agreed to mobility. Standby to EOB and ambulation to restroom. No issues, returned to chair where pt had some labored breathing, SpO2% was 89% on 3L.   Left in chair with all needs met.  Ivonne Marry Mobility Specialist

## 2023-06-10 NOTE — Plan of Care (Signed)

## 2023-06-10 NOTE — Plan of Care (Signed)
  Problem: Education: Goal: Knowledge of General Education information will improve Description: Including pain rating scale, medication(s)/side effects and non-pharmacologic comfort measures 06/10/2023 1150 by Helen Loa, RN Outcome: Adequate for Discharge 06/10/2023 1041 by Helen Loa, RN Outcome: Progressing   Problem: Health Behavior/Discharge Planning: Goal: Ability to manage health-related needs will improve 06/10/2023 1150 by Helen Loa, RN Outcome: Adequate for Discharge 06/10/2023 1041 by Helen Loa, RN Outcome: Progressing   Problem: Clinical Measurements: Goal: Ability to maintain clinical measurements within normal limits will improve 06/10/2023 1150 by Helen Loa, RN Outcome: Adequate for Discharge 06/10/2023 1041 by Helen Loa, RN Outcome: Progressing Goal: Will remain free from infection 06/10/2023 1150 by Helen Loa, RN Outcome: Adequate for Discharge 06/10/2023 1041 by Helen Loa, RN Outcome: Progressing Goal: Diagnostic test results will improve 06/10/2023 1150 by Helen Loa, RN Outcome: Adequate for Discharge 06/10/2023 1041 by Helen Loa, RN Outcome: Progressing Goal: Respiratory complications will improve 06/10/2023 1150 by Helen Loa, RN Outcome: Adequate for Discharge 06/10/2023 1041 by Helen Loa, RN Outcome: Progressing Goal: Cardiovascular complication will be avoided 06/10/2023 1150 by Helen Loa, RN Outcome: Adequate for Discharge 06/10/2023 1041 by Helen Loa, RN Outcome: Progressing   Problem: Activity: Goal: Risk for activity intolerance will decrease 06/10/2023 1150 by Helen Loa, RN Outcome: Adequate for Discharge 06/10/2023 1041 by Helen Loa, RN Outcome: Progressing   Problem: Nutrition: Goal: Adequate nutrition will be maintained 06/10/2023 1150 by Helen Loa, RN Outcome: Adequate for Discharge 06/10/2023 1041 by Helen Loa, RN Outcome: Progressing   Problem: Coping: Goal: Level of anxiety will decrease 06/10/2023 1150 by  Helen Loa, RN Outcome: Adequate for Discharge 06/10/2023 1041 by Helen Loa, RN Outcome: Progressing   Problem: Elimination: Goal: Will not experience complications related to bowel motility 06/10/2023 1150 by Helen Loa, RN Outcome: Adequate for Discharge 06/10/2023 1041 by Helen Loa, RN Outcome: Progressing Goal: Will not experience complications related to urinary retention 06/10/2023 1150 by Helen Loa, RN Outcome: Adequate for Discharge 06/10/2023 1041 by Helen Loa, RN Outcome: Progressing   Problem: Pain Managment: Goal: General experience of comfort will improve and/or be controlled 06/10/2023 1150 by Helen Loa, RN Outcome: Adequate for Discharge 06/10/2023 1041 by Helen Loa, RN Outcome: Progressing   Problem: Safety: Goal: Ability to remain free from injury will improve 06/10/2023 1150 by Helen Loa, RN Outcome: Adequate for Discharge 06/10/2023 1041 by Helen Loa, RN Outcome: Progressing   Problem: Skin Integrity: Goal: Risk for impaired skin integrity will decrease 06/10/2023 1150 by Helen Loa, RN Outcome: Adequate for Discharge 06/10/2023 1041 by Helen Loa, RN Outcome: Progressing

## 2023-06-10 NOTE — Hospital Course (Addendum)
 Brief Narrative:  68 yo M with COPD on home O2, FEV1 50% in 2021, hx RCC, hx lung cancer s/p lobectomy, lung nodules in surveillance by Dr. Baldwin Levee, CAD, and low back pain who presented with cough and right chest pain.  CT chest in the ER showed right lower lobe pneumonia therefore started on Rocephin  and azithromycin . Over the course of 36 hours patient started feeling significantly well in the hospital without any evidence of obvious hypoxia or shortness of breath.  Today he is medically stable to be discharged.  Patient wanting to go home.   Assessment & Plan:  Principal Problem:   Sepsis due to pneumonia [facility]) Active Problems:   Community acquired pneumonia   Community-acquired pneumonia of the right lower lobe Rhinovirus infection - Patient likely has viral infection with superimposed bacterial pneumonia especially with elevated procalcitonin.  Today patient is doing significantly well.  Will transition to p.o. doxycycline  for 4 more days.     COPD Chronic respiratory failure - Continue home prednisone  10 mg daily - Continue Breztri  - Continue fluticasone , bronchodilators, pantoprazole    Lung nodules These are already in surveillance with pulmonology - Follow-up with pulmonology -Patient follows with hospice of the [facility] palliative care outpatient   Mood disorder - Continue Lexapro    Hypertension - Continue losartan .        DVT prophylaxis: enoxaparin  (LOVENOX ) injection 40 mg Start: 06/10/23 1000    Code Status: Limited: Do not attempt resuscitation (DNR) -DNR-LIMITED -Do Not Intubate/DNI  Family Communication:   Discharge today    Subjective:  Feeling well no complaints.  Wishing to go home  Examination:  General exam: Appears calm and comfortable  Respiratory system: Clear to auscultation. Respiratory effort normal. Cardiovascular system: S1 & S2 heard, RRR. No JVD, murmurs, rubs, gallops or clicks. No pedal edema. Gastrointestinal system: Abdomen is  nondistended, soft and nontender. No organomegaly or masses felt. Normal bowel sounds heard. Central nervous system: Alert and oriented. No focal neurological deficits. Extremities: Symmetric 5 x 5 power. Skin: No rashes, lesions or ulcers Psychiatry: Judgement and insight appear normal. Mood & affect appropriate.

## 2023-06-10 NOTE — Plan of Care (Signed)
  Problem: Education: Goal: Knowledge of General Education information will improve Description: Including pain rating scale, medication(s)/side effects and non-pharmacologic comfort measures 06/10/2023 1151 by Helen Loa, RN Outcome: Adequate for Discharge 06/10/2023 1150 by Helen Loa, RN Outcome: Adequate for Discharge 06/10/2023 1041 by Helen Loa, RN Outcome: Progressing   Problem: Health Behavior/Discharge Planning: Goal: Ability to manage health-related needs will improve 06/10/2023 1151 by Helen Loa, RN Outcome: Adequate for Discharge 06/10/2023 1150 by Helen Loa, RN Outcome: Adequate for Discharge 06/10/2023 1041 by Helen Loa, RN Outcome: Progressing   Problem: Clinical Measurements: Goal: Ability to maintain clinical measurements within normal limits will improve 06/10/2023 1151 by Helen Loa, RN Outcome: Adequate for Discharge 06/10/2023 1150 by Helen Loa, RN Outcome: Adequate for Discharge 06/10/2023 1041 by Helen Loa, RN Outcome: Progressing Goal: Will remain free from infection 06/10/2023 1151 by Helen Loa, RN Outcome: Adequate for Discharge 06/10/2023 1150 by Helen Loa, RN Outcome: Adequate for Discharge 06/10/2023 1041 by Helen Loa, RN Outcome: Progressing Goal: Diagnostic test results will improve 06/10/2023 1151 by Helen Loa, RN Outcome: Adequate for Discharge 06/10/2023 1150 by Helen Loa, RN Outcome: Adequate for Discharge 06/10/2023 1041 by Helen Loa, RN Outcome: Progressing Goal: Respiratory complications will improve 06/10/2023 1151 by Helen Loa, RN Outcome: Adequate for Discharge 06/10/2023 1150 by Helen Loa, RN Outcome: Adequate for Discharge 06/10/2023 1041 by Helen Loa, RN Outcome: Progressing Goal: Cardiovascular complication will be avoided 06/10/2023 1151 by Helen Loa, RN Outcome: Adequate for Discharge 06/10/2023 1150 by Helen Loa, RN Outcome: Adequate for Discharge 06/10/2023 1041 by Helen Loa, RN Outcome:  Progressing   Problem: Activity: Goal: Risk for activity intolerance will decrease 06/10/2023 1151 by Helen Loa, RN Outcome: Adequate for Discharge 06/10/2023 1150 by Helen Loa, RN Outcome: Adequate for Discharge 06/10/2023 1041 by Helen Loa, RN Outcome: Progressing   Problem: Nutrition: Goal: Adequate nutrition will be maintained 06/10/2023 1151 by Helen Loa, RN Outcome: Adequate for Discharge 06/10/2023 1150 by Helen Loa, RN Outcome: Adequate for Discharge 06/10/2023 1041 by Helen Loa, RN Outcome: Progressing   Problem: Coping: Goal: Level of anxiety will decrease 06/10/2023 1151 by Helen Loa, RN Outcome: Adequate for Discharge 06/10/2023 1150 by Helen Loa, RN Outcome: Adequate for Discharge 06/10/2023 1041 by Helen Loa, RN Outcome: Progressing   Problem: Elimination: Goal: Will not experience complications related to bowel motility 06/10/2023 1151 by Helen Loa, RN Outcome: Adequate for Discharge 06/10/2023 1150 by Helen Loa, RN Outcome: Adequate for Discharge 06/10/2023 1041 by Helen Loa, RN Outcome: Progressing Goal: Will not experience complications related to urinary retention 06/10/2023 1151 by Helen Loa, RN Outcome: Adequate for Discharge 06/10/2023 1150 by Helen Loa, RN Outcome: Adequate for Discharge 06/10/2023 1041 by Helen Loa, RN Outcome: Progressing   Problem: Pain Managment: Goal: General experience of comfort will improve and/or be controlled 06/10/2023 1151 by Helen Loa, RN Outcome: Adequate for Discharge 06/10/2023 1150 by Helen Loa, RN Outcome: Adequate for Discharge 06/10/2023 1041 by Helen Loa, RN Outcome: Progressing   Problem: Safety: Goal: Ability to remain free from injury will improve 06/10/2023 1151 by Helen Loa, RN Outcome: Adequate for Discharge 06/10/2023 1150 by Helen Loa, RN Outcome: Adequate for Discharge 06/10/2023 1041 by Helen Loa, RN Outcome: Progressing   Problem: Skin Integrity: Goal: Risk for  impaired skin integrity will decrease 06/10/2023 1151 by Helen Loa, RN Outcome: Adequate for Discharge 06/10/2023 1150 by Helen Loa, RN Outcome: Adequate for Discharge 06/10/2023 1041 by Helen Loa, RN Outcome: Progressing

## 2023-06-10 NOTE — Plan of Care (Signed)
  Problem: Education: Goal: Knowledge of General Education information will improve Description: Including pain rating scale, medication(s)/side effects and non-pharmacologic comfort measures Outcome: Progressing   Problem: Clinical Measurements: Goal: Ability to maintain clinical measurements within normal limits will improve Outcome: Progressing   Problem: Clinical Measurements: Goal: Respiratory complications will improve Outcome: Progressing   Problem: Activity: Goal: Risk for activity intolerance will decrease Outcome: Progressing   

## 2023-06-10 NOTE — Discharge Summary (Signed)
 Physician Discharge Summary  [name] ZOX:[id number] DOB: [birth year] DOA: 06/08/2023  PCP: Nooruddin, Saad, MD  Admit date: 06/08/2023 Discharge date: 06/10/2023  Admitted From: Home Disposition: Home  Recommendations for Outpatient Follow-up:  Follow up with PCP in 1-2 weeks Please obtain BMP/CBC in one week your next doctors visit.  4 days of p.o. doxycycline    Discharge Condition: Stable CODE STATUS: DNR Diet recommendation: Regular  Brief/Interim Summary: Brief Narrative:  67 yo M with COPD on home O2, FEV1 50% in 2021, hx RCC, hx lung cancer s/p lobectomy, lung nodules in surveillance by Dr. Baldwin Levee, CAD, and low back pain who presented with cough and right chest pain.  CT chest in the ER showed right lower lobe pneumonia therefore started on Rocephin  and azithromycin . Over the course of 36 hours patient started feeling significantly well in the hospital without any evidence of obvious hypoxia or shortness of breath.  Today he is medically stable to be discharged.  Patient wanting to go home.   Assessment & Plan:  Principal Problem:   Sepsis due to pneumonia [facility]) Active Problems:   Community acquired pneumonia   Community-acquired pneumonia of the right lower lobe Rhinovirus infection - Patient likely has viral infection with superimposed bacterial pneumonia especially with elevated procalcitonin.  Today patient is doing significantly well.  Will transition to p.o. doxycycline  for 4 more days.     COPD Chronic respiratory failure - Continue home prednisone  10 mg daily - Continue Breztri  - Continue fluticasone , bronchodilators, pantoprazole    Lung nodules These are already in surveillance with pulmonology - Follow-up with pulmonology -Patient follows with [facility] the [facility] outpatient   Mood disorder - Continue Lexapro    Hypertension - Continue losartan .        DVT prophylaxis: enoxaparin  (LOVENOX ) injection 40 mg Start: 06/10/23 1000     Code Status: Limited: Do not attempt resuscitation (DNR) -DNR-LIMITED -Do Not Intubate/DNI  Family Communication:   Discharge today    Subjective:  Feeling well no complaints.  Wishing to go home  Examination:  General exam: Appears calm and comfortable  Respiratory system: Clear to auscultation. Respiratory effort normal. Cardiovascular system: S1 & S2 heard, RRR. No JVD, murmurs, rubs, gallops or clicks. No pedal edema. Gastrointestinal system: Abdomen is nondistended, soft and nontender. No organomegaly or masses felt. Normal bowel sounds heard. Central nervous system: Alert and oriented. No focal neurological deficits. Extremities: Symmetric 5 x 5 power. Skin: No rashes, lesions or ulcers Psychiatry: Judgement and insight appear normal. Mood & affect appropriate.    Discharge Diagnoses:  Principal Problem:   Sepsis due to pneumonia [facility]) Active Problems:   Community acquired pneumonia      Discharge Exam: Vitals:   06/10/23 0513 06/10/23 0906  BP: (!) 146/99   Pulse: (!) 57   Resp: 20   Temp: 97.9 F (36.6 C)   SpO2: 94% 96%   Vitals:   06/09/23 1956 06/09/23 2024 06/10/23 0513 06/10/23 0906  BP: (!) 146/80  (!) 146/99   Pulse: (!) 57  (!) 57   Resp: 20  20   Temp: 98 F (36.7 C)  97.9 F (36.6 C)   TempSrc: Oral  Oral   SpO2: 97% 96% 94% 96%  Weight:      Height:          Discharge Instructions   Allergies as of 06/10/2023   No Known Allergies      Medication List     TAKE these medications  albuterol  108 (90 Base) MCG/ACT inhaler Commonly known as: VENTOLIN  HFA INHALE 2 PUFFS BY MOUTH EVERY 6 HOURS AS NEEDED FOR WHEEZING FOR SHORTNESS OF BREATH   buprenorphine 7.5 MCG/HR Commonly known as: BUTRANS Place 1 patch onto the skin once a week.   cetirizine  10 MG tablet Commonly known as: ZyrTEC  Allergy Take 1 tablet (10 mg total) by mouth daily.   doxycycline  100 MG capsule Commonly known as: VIBRAMYCIN  Take 1 capsule (100 mg  total) by mouth 2 (two) times daily for 4 days.   escitalopram  10 MG tablet Commonly known as: Lexapro  Take 1 tablet (10 mg total) by mouth daily.   finasteride 5 MG tablet Commonly known as: PROSCAR Take 5 mg by mouth daily.   fluticasone  50 MCG/ACT nasal spray Commonly known as: FLONASE  Use 2 spray(s) in each nostril once daily What changed:  how much to take how to take this when to take this reasons to take this additional instructions   Gemtesa 75 MG Tabs Generic drug: Vibegron Take 75 mg by mouth daily.   hydrOXYzine  25 MG tablet Commonly known as: ATARAX  Take 1 tablet (25 mg total) by mouth every 6 (six) hours as needed for anxiety.   ipratropium 0.03 % nasal spray Commonly known as: ATROVENT  Place 2 sprays into both nostrils every 12 (twelve) hours. What changed: when to take this   isosorbide  mononitrate 60 MG 24 hr tablet Commonly known as: IMDUR  Take 1 tablet (60 mg total) by mouth daily. What changed:  when to take this reasons to take this   lidocaine  5 % Commonly known as: Lidoderm  Place 1 patch unto chest wall. Remove & Discard patch within 12 hours.   losartan  25 MG tablet Commonly known as: COZAAR  Take 0.5 tablets (12.5 mg total) by mouth daily.   metoCLOPramide  5 MG tablet Commonly known as: REGLAN  Take 5 mg by mouth every 8 (eight) hours as needed for nausea.   nitroGLYCERIN  0.4 MG SL tablet Commonly known as: NITROSTAT  Place 1 tablet (0.4 mg total) under the tongue every 5 (five) minutes as needed for chest pain.   omeprazole  40 MG capsule Commonly known as: PRILOSEC Take 1 capsule (40 mg total) by mouth daily.   OXYGEN  Inhale 2 L into the lungs continuous.   predniSONE  10 MG tablet Commonly known as: DELTASONE  Take 1 tablet (10 mg total) by mouth daily with breakfast.   rosuvastatin  20 MG tablet Commonly known as: CRESTOR  Take 1 tablet (20 mg total) by mouth daily.   Trelegy Ellipta  200-62.5-25 MCG/ACT Aepb Generic drug:  Fluticasone -Umeclidin-Vilant INHALE 1 PUFF ONCE DAILY        No Known Allergies  You were cared for by a hospitalist during your hospital stay. If you have any questions about your discharge medications or the care you received while you were in the hospital after you are discharged, you can call the unit and asked to speak with the hospitalist on call if the hospitalist that took care of you is not available. Once you are discharged, your primary care physician will handle any further medical issues. Please note that no refills for any discharge medications will be authorized once you are discharged, as it is imperative that you return to your primary care physician (or establish a relationship with a primary care physician if you do not have one) for your aftercare needs so that they can reassess your need for medications and monitor your lab values.  You were cared for by a hospitalist during  your hospital stay. If you have any questions about your discharge medications or the care you received while you were in the hospital after you are discharged, you can call the unit and asked to speak with the hospitalist on call if the hospitalist that took care of you is not available. Once you are discharged, your primary care physician will handle any further medical issues. Please note that NO REFILLS for any discharge medications will be authorized once you are discharged, as it is imperative that you return to your primary care physician (or establish a relationship with a primary care physician if you do not have one) for your aftercare needs so that they can reassess your need for medications and monitor your lab values.  Please request your Prim.MD to go over all Hospital Tests and Procedure/Radiological results at the follow up, please get all Hospital records sent to your Prim MD by signing hospital release before you go home.  Get CBC, CMP, 2 view Chest X ray checked  by Primary MD during your  next visit or SNF MD in 5-7 days ( we routinely change or add medications that can affect your baseline labs and fluid status, therefore we recommend that you get the mentioned basic workup next visit with your PCP, your PCP may decide not to get them or add new tests based on their clinical decision)  On your next visit with your primary care physician please Get Medicines reviewed and adjusted.  If you experience worsening of your admission symptoms, develop shortness of breath, life threatening emergency, suicidal or homicidal thoughts you must seek medical attention immediately by calling 911 or calling your MD immediately  if symptoms less severe.  You Must read complete instructions/literature along with all the possible adverse reactions/side effects for all the Medicines you take and that have been prescribed to you. Take any new Medicines after you have completely understood and accpet all the possible adverse reactions/side effects.   Do not drive, operate heavy machinery, perform activities at heights, swimming or participation in water  activities or provide baby sitting services if your were admitted for syncope or siezures until you have seen by Primary MD or a Neurologist and advised to do so again.  Do not drive when taking Pain medications.   Procedures/Studies: CT CHEST ABDOMEN PELVIS W CONTRAST Result Date: 06/08/2023 CLINICAL DATA:  Sepsis. Hemoptysis. History of lung cancer and renal cancer. * Tracking Code: BO * EXAM: CT CHEST, ABDOMEN, AND PELVIS WITH CONTRAST TECHNIQUE: Multidetector CT imaging of the chest, abdomen and pelvis was performed following the standard protocol during bolus administration of intravenous contrast. RADIATION DOSE REDUCTION: This exam was performed according to the departmental dose-optimization program which includes automated exposure control, adjustment of the mA and/or kV according to patient size and/or use of iterative reconstruction technique.  CONTRAST:  OMNIPAQUE  IOHEXOL  300 MG/ML  SOLN COMPARISON:  Multiple prior imaging studies. The most recent chest CT is 06/01/2023 FINDINGS: CT CHEST FINDINGS Cardiovascular: The heart is normal in size. No pericardial effusion. Stable tortuosity a and scattered calcification of the thoracic aorta. No dissection. Stable age advanced three-vessel coronary artery calcifications. Mediastinum/Nodes: Stable small scattered mediastinal hilar lymph nodes but no mass or overt adenopathy. No new or progressive changes. The esophagus is grossly normal. Lungs/Pleura: Stable emphysematous changes and pulmonary scarring. Stable dumbbell-shaped nodular lesion in the right upper measuring a maximum 18 mm. Stable curvilinear nodular density in the left upper lobe measuring a maximum of 13 mm. Small adjacent  more peripheral nodules are stable at 6 mm. Stable 7 mm right upper lobe nodule on image number 67/6. Stable 7 mm nodule in the left upper lobe on image number 82/6. Several other smaller pulmonary nodules are also stable. Progressive extensive linear scarring type changes and basilar airspace opacity and associated bronchiectasis in the right lower lobe. This could reflect radiation change, progressive infection or aspiration. No pleural effusion. Musculoskeletal: No significant bony findings. CT ABDOMEN PELVIS FINDINGS Hepatobiliary: No focal liver abnormality is seen. No gallstones, gallbladder wall thickening, or biliary dilatation. Pancreas: Unremarkable. No pancreatic ductal dilatation or surrounding inflammatory changes. Spleen: Normal in size without focal abnormality. Adrenals/Urinary Tract: The adrenal glands and kidneys are unremarkable and stable stable surgical changes from a partial left nephrectomy. No findings for residual or recurrent tumor. The bladder is normal. Stomach/Bowel: The stomach, duodenum, small bowel and colon are grossly normal without oral contrast. No inflammatory changes, mass lesions or  obstructive findings. The appendix is normal. Vascular/Lymphatic: Age advanced atherosclerotic calcifications involving the aorta and branch vessels but no aneurysm or dissection. The major venous structures are patent. No mesenteric or retroperitoneal mass or adenopathy. Reproductive: The prostate gland and seminal vesicles are unremarkable. Other: No pelvic mass or adenopathy. No free pelvic fluid collections. No inguinal mass or adenopathy. No abdominal wall hernia or subcutaneous lesions. Musculoskeletal: No significant bony findings. IMPRESSION: 1. Progressive extensive linear scarring type changes and basilar airspace opacity and associated bronchiectasis in the right lower lobe. This could reflect radiation change, progressive infection or aspiration. 2. Stable bilateral pulmonary nodules. 3. Stable surgical changes from a partial left nephrectomy. No findings for residual or recurrent tumor. 4. No acute abdominal/pelvic findings, mass lesions or adenopathy. 5. Age advanced atherosclerotic calcifications involving the thoracic and abdominal aorta and branch vessels including the coronary arteries. 6. Aortic atherosclerosis. Aortic Atherosclerosis (ICD10-I70.0). Electronically Signed   By: Marrian Siva M.D.   On: 06/08/2023 15:32   DG Chest 2 View Result Date: 06/08/2023 CLINICAL DATA:  Right-sided chest pain EXAM: CHEST - 2 VIEW COMPARISON:  Chest CT performed June 01, 2023 FINDINGS: Interstitial airspace opacities. Nodules are present in the upper lung zones bilaterally which are similar when compared to the previous CT scan. No pneumothorax or significant pleural effusion. Heart mediastinum are not significantly changed. IMPRESSION: 1. Emphysema. 2. Bilateral pulmonary nodules which are grossly similar when compared to CT performed June 01, 2023. Electronically Signed   By: Reagan Camera M.D.   On: 06/08/2023 10:52     The results of significant diagnostics from this hospitalization (including  imaging, microbiology, ancillary and laboratory) are listed below for reference.     Microbiology: Recent Results (from the past 240 hours)  Resp panel by RT-PCR (RSV, Flu A&B, Covid) Anterior Nasal Swab     Status: None   Collection Time: 06/08/23  9:28 AM   Specimen: Anterior Nasal Swab  Result Value Ref Range Status   SARS Coronavirus 2 by RT PCR NEGATIVE NEGATIVE Final    Comment: (NOTE) SARS-CoV-2 target nucleic acids are NOT DETECTED.  The SARS-CoV-2 RNA is generally detectable in upper respiratory specimens during the acute phase of infection. The lowest concentration of SARS-CoV-2 viral copies this assay can detect is 138 copies/mL. A negative result does not preclude SARS-Cov-2 infection and should not be used as the sole basis for treatment or other patient management decisions. A negative result may occur with  improper specimen collection/handling, submission of specimen other than nasopharyngeal swab, presence of viral mutation(s)  within the areas targeted by this assay, and inadequate number of viral copies(<138 copies/mL). A negative result must be combined with clinical observations, patient history, and epidemiological information. The expected result is Negative.  Fact Sheet for Patients:  BloggerCourse.com  Fact Sheet for Healthcare Providers:  SeriousBroker.it  This test is no t yet approved or cleared by the [place]  FDA and  has been authorized for detection and/or diagnosis of SARS-CoV-2 by FDA under an Emergency Use Authorization (EUA). This EUA will remain  in effect (meaning this test can be used) for the duration of the COVID-19 declaration under Section 564(b)(1) of the Act, 21 U.S.C.section 360bbb-3(b)(1), unless the authorization is terminated  or revoked sooner.       Influenza A by PCR NEGATIVE NEGATIVE Final   Influenza B by PCR NEGATIVE NEGATIVE Final    Comment: (NOTE) The Xpert  Xpress SARS-CoV-2/FLU/RSV plus assay is intended as an aid in the diagnosis of influenza from Nasopharyngeal swab specimens and should not be used as a sole basis for treatment. Nasal washings and aspirates are unacceptable for Xpert Xpress SARS-CoV-2/FLU/RSV testing.  Fact Sheet for Patients: BloggerCourse.com  Fact Sheet for Healthcare Providers: SeriousBroker.it  This test is not yet approved or cleared by the [place]  FDA and has been authorized for detection and/or diagnosis of SARS-CoV-2 by FDA under an Emergency Use Authorization (EUA). This EUA will remain in effect (meaning this test can be used) for the duration of the COVID-19 declaration under Section 564(b)(1) of the Act, 21 U.S.C. section 360bbb-3(b)(1), unless the authorization is terminated or revoked.     Resp Syncytial Virus by PCR NEGATIVE NEGATIVE Final    Comment: (NOTE) Fact Sheet for Patients: BloggerCourse.com  Fact Sheet for Healthcare Providers: SeriousBroker.it  This test is not yet approved or cleared by the [place]  FDA and has been authorized for detection and/or diagnosis of SARS-CoV-2 by FDA under an Emergency Use Authorization (EUA). This EUA will remain in effect (meaning this test can be used) for the duration of the COVID-19 declaration under Section 564(b)(1) of the Act, 21 U.S.C. section 360bbb-3(b)(1), unless the authorization is terminated or revoked.  Performed at [facility], [address], [place], [place] [address]   Blood culture (routine x 2)     Status: None (Preliminary result)   Collection Time: 06/08/23  9:42 AM   Specimen: BLOOD  Result Value Ref Range Status   Specimen Description   Final    BLOOD RIGHT ANTECUBITAL Performed at [facility], [address], [place], [place] [address]    Special Requests   Final    BOTTLES  DRAWN AEROBIC AND ANAEROBIC Blood Culture results may not be optimal due to an inadequate volume of blood received in culture bottles Performed at [facility], [address], [place], [place] [address]    Culture   Final    NO GROWTH 2 DAYS Performed at [facility], [address], [place], [place] [address]    Report Status PENDING  Incomplete  Blood culture (routine x 2)     Status: None (Preliminary result)   Collection Time: 06/08/23 10:27 AM   Specimen: BLOOD  Result Value Ref Range Status   Specimen Description   Final    BLOOD LEFT ANTECUBITAL Performed at [facility], [address], [place], [place] [address]    Special Requests   Final    BOTTLES DRAWN AEROBIC AND ANAEROBIC Blood Culture results  may not be optimal due to an inadequate volume of blood received in culture bottles Performed at [facility], [address], [place], [place] [address]    Culture   Final    NO GROWTH 2 DAYS Performed at [facility], [address], [place], [place] [address]    Report Status PENDING  Incomplete  MRSA Next Gen by PCR, Nasal     Status: None   Collection Time: 06/08/23  6:41 PM   Specimen: Nasal Mucosa; Nasal Swab  Result Value Ref Range Status   MRSA by PCR Next Gen NOT DETECTED NOT DETECTED Final    Comment: (NOTE) The GeneXpert MRSA Assay (FDA approved for NASAL specimens only), is one component of a comprehensive MRSA colonization surveillance program. It is not intended to diagnose MRSA infection nor to guide or monitor treatment for MRSA infections. Test performance is not FDA approved in patients less than 38 years old. Performed at [facility], [address] [place], [place] [address]   Respiratory (~20 pathogens) panel by PCR     Status: Abnormal   Collection Time: 06/08/23  6:41 PM   Specimen: Nasal Mucosa; Respiratory  Result Value Ref Range Status   Adenovirus NOT  DETECTED NOT DETECTED Final   Coronavirus 229E NOT DETECTED NOT DETECTED Final    Comment: (NOTE) The Coronavirus on the Respiratory Panel, DOES NOT test for the novel  Coronavirus (2019 nCoV)    Coronavirus HKU1 NOT DETECTED NOT DETECTED Final   Coronavirus NL63 NOT DETECTED NOT DETECTED Final   Coronavirus OC43 NOT DETECTED NOT DETECTED Final   Metapneumovirus NOT DETECTED NOT DETECTED Final   Rhinovirus / Enterovirus DETECTED (A) NOT DETECTED Final   Influenza A NOT DETECTED NOT DETECTED Final   Influenza B NOT DETECTED NOT DETECTED Final   Parainfluenza Virus 1 NOT DETECTED NOT DETECTED Final   Parainfluenza Virus 2 NOT DETECTED NOT DETECTED Final   Parainfluenza Virus 3 NOT DETECTED NOT DETECTED Final   Parainfluenza Virus 4 NOT DETECTED NOT DETECTED Final   Respiratory Syncytial Virus NOT DETECTED NOT DETECTED Final   Bordetella pertussis NOT DETECTED NOT DETECTED Final   Bordetella Parapertussis NOT DETECTED NOT DETECTED Final   Chlamydophila pneumoniae NOT DETECTED NOT DETECTED Final   Mycoplasma pneumoniae NOT DETECTED NOT DETECTED Final    Comment: Performed at [facility], [address], [place], [place] [address]     Labs: BNP (last 3 results) Recent Labs    02/15/23 1524 06/08/23 0942  BNP 61.9 138.1*   Basic Metabolic Panel: Recent Labs  Lab 06/08/23 1149 06/09/23 0545 06/10/23 0536  NA 138 134* 134*  K 3.7 4.5 3.9  CL 107 105 104  CO2 20* 23 24  GLUCOSE 110* 108* 89  BUN 10 18 13   CREATININE 1.05 0.89 1.09  CALCIUM  8.3* 8.3* 8.1*   Liver Function Tests: Recent Labs  Lab 06/08/23 1149 06/10/23 0536  AST 17 11*  ALT 11 10  ALKPHOS 103 75  BILITOT 0.8 0.5  PROT 6.6 5.6*  ALBUMIN  3.3* 2.7*   No results for input(s): "LIPASE", "AMYLASE" in the last 168 hours. No results for input(s): "AMMONIA" in the last 168 hours. CBC: Recent Labs  Lab 06/08/23 0942 06/09/23 0545 06/10/23 0536  WBC 27.8* 23.2* 16.9*  NEUTROABS 25.2*  --   --    HGB 13.9 11.6* 11.4*  HCT 43.7 37.0* 37.3*  MCV 94.0 94.9 96.6  PLT 380 267 284  Cardiac Enzymes: No results for input(s): "CKTOTAL", "CKMB", "CKMBINDEX", "TROPONINI" in the last 168 hours. BNP: Invalid input(s): "POCBNP" CBG: No results for input(s): "GLUCAP" in the last 168 hours. D-Dimer No results for input(s): "DDIMER" in the last 72 hours. Hgb A1c No results for input(s): "HGBA1C" in the last 72 hours. Lipid Profile No results for input(s): "CHOL", "HDL", "LDLCALC", "TRIG", "CHOLHDL", "LDLDIRECT" in the last 72 hours. Thyroid  function studies No results for input(s): "TSH", "T4TOTAL", "T3FREE", "THYROIDAB" in the last 72 hours.  Invalid input(s): "FREET3" Anemia work up No results for input(s): "VITAMINB12", "FOLATE", "FERRITIN", "TIBC", "IRON", "RETICCTPCT" in the last 72 hours. Urinalysis    Component Value Date/Time   COLORURINE YELLOW 11/17/2022 1552   APPEARANCEUR CLEAR 11/17/2022 1552   APPEARANCEUR Clear 09/17/2022 1516   LABSPEC 1.019 11/17/2022 1552   PHURINE 5.5 11/17/2022 1552   GLUCOSEU NEGATIVE 11/17/2022 1552   HGBUR NEGATIVE 11/17/2022 1552   BILIRUBINUR negative 06/04/2023 1618   BILIRUBINUR Negative 09/17/2022 1516   KETONESUR NEGATIVE 11/17/2022 1552   PROTEINUR Positive (A) 06/04/2023 1618   PROTEINUR TRACE (A) 11/17/2022 1552   UROBILINOGEN 0.2 06/04/2023 1618   UROBILINOGEN 1.0 12/27/2012 2109   NITRITE negative 06/04/2023 1618   NITRITE NEGATIVE 11/17/2022 1552   LEUKOCYTESUR Negative 06/04/2023 1618   LEUKOCYTESUR NEGATIVE 11/17/2022 1552   Sepsis Labs Recent Labs  Lab 06/08/23 0942 06/09/23 0545 06/10/23 0536  WBC 27.8* 23.2* 16.9*   Microbiology Recent Results (from the past 240 hours)  Resp panel by RT-PCR (RSV, Flu A&B, Covid) Anterior Nasal Swab     Status: None   Collection Time: 06/08/23  9:28 AM   Specimen: Anterior Nasal Swab  Result Value Ref Range Status   SARS Coronavirus 2 by RT PCR NEGATIVE NEGATIVE Final     Comment: (NOTE) SARS-CoV-2 target nucleic acids are NOT DETECTED.  The SARS-CoV-2 RNA is generally detectable in upper respiratory specimens during the acute phase of infection. The lowest concentration of SARS-CoV-2 viral copies this assay can detect is 138 copies/mL. A negative result does not preclude SARS-Cov-2 infection and should not be used as the sole basis for treatment or other patient management decisions. A negative result may occur with  improper specimen collection/handling, submission of specimen other than nasopharyngeal swab, presence of viral mutation(s) within the areas targeted by this assay, and inadequate number of viral copies(<138 copies/mL). A negative result must be combined with clinical observations, patient history, and epidemiological information. The expected result is Negative.  Fact Sheet for Patients:  BloggerCourse.com  Fact Sheet for Healthcare Providers:  SeriousBroker.it  This test is no t yet approved or cleared by the [place]  FDA and  has been authorized for detection and/or diagnosis of SARS-CoV-2 by FDA under an Emergency Use Authorization (EUA). This EUA will remain  in effect (meaning this test can be used) for the duration of the COVID-19 declaration under Section 564(b)(1) of the Act, 21 U.S.C.section 360bbb-3(b)(1), unless the authorization is terminated  or revoked sooner.       Influenza A by PCR NEGATIVE NEGATIVE Final   Influenza B by PCR NEGATIVE NEGATIVE Final    Comment: (NOTE) The Xpert Xpress SARS-CoV-2/FLU/RSV plus assay is intended as an aid in the diagnosis of influenza from Nasopharyngeal swab specimens and should not be used as a sole basis for treatment. Nasal washings and aspirates are unacceptable for Xpert Xpress SARS-CoV-2/FLU/RSV testing.  Fact Sheet for Patients: BloggerCourse.com  Fact Sheet for Healthcare  Providers: SeriousBroker.it  This test is not  yet approved or cleared by the [place]  FDA and has been authorized for detection and/or diagnosis of SARS-CoV-2 by FDA under an Emergency Use Authorization (EUA). This EUA will remain in effect (meaning this test can be used) for the duration of the COVID-19 declaration under Section 564(b)(1) of the Act, 21 U.S.C. section 360bbb-3(b)(1), unless the authorization is terminated or revoked.     Resp Syncytial Virus by PCR NEGATIVE NEGATIVE Final    Comment: (NOTE) Fact Sheet for Patients: BloggerCourse.com  Fact Sheet for Healthcare Providers: SeriousBroker.it  This test is not yet approved or cleared by the [place]  FDA and has been authorized for detection and/or diagnosis of SARS-CoV-2 by FDA under an Emergency Use Authorization (EUA). This EUA will remain in effect (meaning this test can be used) for the duration of the COVID-19 declaration under Section 564(b)(1) of the Act, 21 U.S.C. section 360bbb-3(b)(1), unless the authorization is terminated or revoked.  Performed at [facility], [address], [place], [place] [address]   Blood culture (routine x 2)     Status: None (Preliminary result)   Collection Time: 06/08/23  9:42 AM   Specimen: BLOOD  Result Value Ref Range Status   Specimen Description   Final    BLOOD RIGHT ANTECUBITAL Performed at [facility], [address], [place], [place] [address]    Special Requests   Final    BOTTLES DRAWN AEROBIC AND ANAEROBIC Blood Culture results may not be optimal due to an inadequate volume of blood received in culture bottles Performed at [facility], [address], [place], [place] [address]    Culture   Final    NO GROWTH 2 DAYS Performed at [facility], [address] [place], [place] [address]    Report Status PENDING   Incomplete  Blood culture (routine x 2)     Status: None (Preliminary result)   Collection Time: 06/08/23 10:27 AM   Specimen: BLOOD  Result Value Ref Range Status   Specimen Description   Final    BLOOD LEFT ANTECUBITAL Performed at [facility], [address], [place], [place] [address]    Special Requests   Final    BOTTLES DRAWN AEROBIC AND ANAEROBIC Blood Culture results may not be optimal due to an inadequate volume of blood received in culture bottles Performed at [facility], [address] [place], [place] [address]    Culture   Final    NO GROWTH 2 DAYS Performed at [facility], [address] [place], [place] [address]    Report Status PENDING  Incomplete  MRSA Next Gen by PCR, Nasal     Status: None   Collection Time: 06/08/23  6:41 PM   Specimen: Nasal Mucosa; Nasal Swab  Result Value Ref Range Status   MRSA by PCR Next Gen NOT DETECTED NOT DETECTED Final    Comment: (NOTE) The GeneXpert MRSA Assay (FDA approved for NASAL specimens only), is one component of a comprehensive MRSA colonization surveillance program. It is not intended to diagnose MRSA infection nor to guide or monitor treatment for MRSA infections. Test performance is not FDA approved in patients less than 5 years old. Performed at [facility], [address], [place], [place] [address]   Respiratory (~20 pathogens) panel by PCR     Status: Abnormal   Collection Time: 06/08/23  6:41 PM   Specimen: Nasal Mucosa; Respiratory  Result Value  Ref Range Status   Adenovirus NOT DETECTED NOT DETECTED Final   Coronavirus 229E NOT DETECTED NOT DETECTED Final    Comment: (NOTE) The Coronavirus on the Respiratory Panel, DOES NOT test for the novel  Coronavirus (2019 nCoV)    Coronavirus HKU1 NOT DETECTED NOT DETECTED Final   Coronavirus NL63 NOT DETECTED NOT DETECTED Final   Coronavirus OC43 NOT DETECTED NOT DETECTED Final    Metapneumovirus NOT DETECTED NOT DETECTED Final   Rhinovirus / Enterovirus DETECTED (A) NOT DETECTED Final   Influenza A NOT DETECTED NOT DETECTED Final   Influenza B NOT DETECTED NOT DETECTED Final   Parainfluenza Virus 1 NOT DETECTED NOT DETECTED Final   Parainfluenza Virus 2 NOT DETECTED NOT DETECTED Final   Parainfluenza Virus 3 NOT DETECTED NOT DETECTED Final   Parainfluenza Virus 4 NOT DETECTED NOT DETECTED Final   Respiratory Syncytial Virus NOT DETECTED NOT DETECTED Final   Bordetella pertussis NOT DETECTED NOT DETECTED Final   Bordetella Parapertussis NOT DETECTED NOT DETECTED Final   Chlamydophila pneumoniae NOT DETECTED NOT DETECTED Final   Mycoplasma pneumoniae NOT DETECTED NOT DETECTED Final    Comment: Performed at [facility], [address] [place], [place] [address]     Time coordinating discharge:  I have spent 35 minutes face to face with the patient and on the ward discussing the patients care, assessment, plan and disposition with other care givers. >50% of the time was devoted counseling the patient about the risks and benefits of treatment/Discharge disposition and coordinating care.   SIGNED:   Maggie Schooner, MD  [facility] 06/10/2023, 2:39 PM   If 7PM-7AM, please contact night-coverage

## 2023-06-11 ENCOUNTER — Telehealth: Payer: Self-pay

## 2023-06-11 ENCOUNTER — Ambulatory Visit (INDEPENDENT_AMBULATORY_CARE_PROVIDER_SITE_OTHER): Admitting: Licensed Clinical Social Worker

## 2023-06-11 DIAGNOSIS — F419 Anxiety disorder, unspecified: Secondary | ICD-10-CM

## 2023-06-11 DIAGNOSIS — F32A Depression, unspecified: Secondary | ICD-10-CM | POA: Diagnosis not present

## 2023-06-11 NOTE — Transitions of Care (Post Inpatient/ED Visit) (Signed)
   06/11/2023  Name: [name] MRN: [id number] DOB: 1955/05/02  Today's TOC FU Call Status: Today's TOC FU Call Status:: Unsuccessful Call (1st Attempt) Unsuccessful Call (1st Attempt) Date: 06/11/23 (Patient states he is on his way to MD appointment)  Attempted to reach the patient regarding the most recent Inpatient/ED visit.  Follow Up Plan: Additional outreach attempts will be made to reach the patient to complete the Transitions of Care (Post Inpatient/ED visit) call.   Tonia Frankel RN, CCM [place]  VBCI-Population Health RN Care Manager [phone]

## 2023-06-12 ENCOUNTER — Telehealth: Payer: Self-pay

## 2023-06-12 NOTE — Transitions of Care (Post Inpatient/ED Visit) (Signed)
   06/12/2023  Name: [name] MRN: [id number] DOB: 1955/11/20  Today's TOC FU Call Status: Today's TOC FU Call Status:: Successful TOC FU Call Completed TOC FU Call Complete Date: 06/12/23  Attempted to reach the patient regarding the most recent Inpatient/ED visit.  Patient answered but then stated he was in [place] and could RN CM call him back another time. Patient agreed to call to confirm PCP post discharge appointment in the meantime. No immediate needs or concerns. Continue to have headaches not addressed on this admission.   Follow Up Plan: Additional outreach attempts will be made to reach the patient to complete the Transitions of Care (Post Inpatient/ED visit) call.    Katheryn Pandy MSN, RN RN Case Sales executive Health  VBCI-Population Health Office Hours Wed/Thur  8:00 am-6:00 pm Direct Dial: [phone] Main Phone [phone]  Fax: [phone] [email or web address]

## 2023-06-13 LAB — CULTURE, BLOOD (ROUTINE X 2)
Culture: NO GROWTH
Culture: NO GROWTH

## 2023-06-15 ENCOUNTER — Telehealth: Payer: Self-pay

## 2023-06-15 DIAGNOSIS — G8929 Other chronic pain: Secondary | ICD-10-CM | POA: Diagnosis not present

## 2023-06-15 DIAGNOSIS — Z79899 Other long term (current) drug therapy: Secondary | ICD-10-CM | POA: Diagnosis not present

## 2023-06-15 DIAGNOSIS — M7918 Myalgia, other site: Secondary | ICD-10-CM | POA: Diagnosis not present

## 2023-06-15 DIAGNOSIS — M5416 Radiculopathy, lumbar region: Secondary | ICD-10-CM | POA: Diagnosis not present

## 2023-06-15 DIAGNOSIS — G8912 Acute post-thoracotomy pain: Secondary | ICD-10-CM | POA: Diagnosis not present

## 2023-06-15 DIAGNOSIS — M47816 Spondylosis without myelopathy or radiculopathy, lumbar region: Secondary | ICD-10-CM | POA: Diagnosis not present

## 2023-06-15 NOTE — Transitions of Care (Post Inpatient/ED Visit) (Signed)
   06/15/2023  Name: [name] MRN: [id number] DOB: 19-Sep-1955  Today's TOC FU Call Status: Today's TOC FU Call Status:: Unsuccessful Call (3rd Attempt) Unsuccessful Call (3rd Attempt) Date: 06/15/23 (Discussed TOC program with patient who states he is currently not able to talk but asked TOC RN to call back Tuesday afternoon)  Attempted to reach the patient regarding the most recent Inpatient/ED visit.  Follow Up Plan: Additional outreach attempts will be made to reach the patient to complete the Transitions of Care (Post Inpatient/ED visit) call.   Tonia Frankel RN, CCM [place]  VBCI-Population Health RN Care Manager [phone]

## 2023-06-16 ENCOUNTER — Telehealth: Payer: Self-pay

## 2023-06-16 NOTE — Transitions of Care (Post Inpatient/ED Visit) (Signed)
   06/16/2023  Name: [name] MRN: [id number] DOB: 22-Mar-1955  Today's TOC FU Call Status:TOC made 4th attempt to reach patient. 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 TOC RN told patient that CCM nurse will be made aware of his recent hospital discharge and that he has been unable to connect with [facility] RN for enrollment in [facility] program due to MD appointments and phone calls - Patient agreed to call [facility] RN if he has any questions or is available to enroll. Teams message sent to [facility] advising of above.     Attempted to reach the patient regarding the most recent Inpatient/ED visit.  Follow Up Plan: No further outreach attempts will be made at this time. We have been unable to contact the patient.  Tonia Frankel RN, CCM [place]  VBCI-Population Health RN Care Manager [phone]

## 2023-06-23 ENCOUNTER — Other Ambulatory Visit: Payer: Self-pay | Admitting: Pharmacist

## 2023-06-23 NOTE — Progress Notes (Signed)
   06/23/2023  Patient ID: [name], male   DOB: [birth year], 68 y.o.   MRN: [id number]  Called and spoke with the patient on the phone today regarding medications.  Was in the hospital recently due to Rhinovirus + pneumonia- finished his Doxycycline  and doing better since then.  For pain, he is being switched to [facility] to discuss other options. Butrans was stopped and note said to continue Duloxetine  + Meloxicam  prescribed by other doctors. Patient not currently taking. Said BC powder is the only thing that helps each morning. Advised he can do this or Meloxicam  (leftover at home), but needs to take with food each time to avoid GI bleed/stomach ulcer- confirmed understanding.  Feeling overwhelmed by number of specialist visits. Advised to keep them onboard for now and consider value of visit after meeting with them. Needs to keep PCP, pain, and pulmonology doctors.   For Gemtesa, continue until 07/30/23 and discuss with Oncology regarding efficacy. Advised all meds except Trelegy and Prednisone  are really for comfort measures and his decision to continue at this point. Overwhelmed by extensive visits and medications.  Follow-up in 1 month.   Delvin File, PharmD [facility]  Phone Number: [phone]

## 2023-06-24 ENCOUNTER — Ambulatory Visit (INDEPENDENT_AMBULATORY_CARE_PROVIDER_SITE_OTHER): Admitting: Student

## 2023-06-24 VITALS — BP 147/79 | HR 53 | Ht 69.0 in | Wt 153.0 lb

## 2023-06-24 DIAGNOSIS — J189 Pneumonia, unspecified organism: Secondary | ICD-10-CM

## 2023-06-24 DIAGNOSIS — J439 Emphysema, unspecified: Secondary | ICD-10-CM | POA: Diagnosis not present

## 2023-06-24 DIAGNOSIS — J159 Unspecified bacterial pneumonia: Secondary | ICD-10-CM

## 2023-06-24 DIAGNOSIS — J449 Chronic obstructive pulmonary disease, unspecified: Secondary | ICD-10-CM

## 2023-06-24 NOTE — Patient Instructions (Addendum)
 It was a pleasure taking care of you today!    Please finish the 4 days of p.o. doxycycline  that you are discharged with.  2.  Continue to take your inhalers for your COPD.   I have ordered the following labs for you:   Lab Orders         CBC         BMP8       Follow up: 4-6 months   Should you have any questions or concerns please call the internal medicine clinic at [phone].     Marni Sins, MD  [facility]

## 2023-06-24 NOTE — Assessment & Plan Note (Addendum)
 Overall, he is doing well.  He uses 2 L of oxygen , denies any increased oxygen  needs.  Denies worsening cough or dyspnea.  Patient was instructed to complete 4 days of p.o. doxycycline  after discharge from the hospital.  Patient has not completed the p.o. doxycycline , he said he felt overwhelmed with many medicines that he has to take every day. Plan: -Will check CBC and BMP. - Encourage patient to complete 4 days of p.o. doxycycline .

## 2023-06-24 NOTE — Assessment & Plan Note (Signed)
 COPD History of chronic respiratory failure, he follows with pulm in clinic.  He was last seen 04/25. - Continue home prednisone  10 mg daily - Continue Breztri  - Continue fluticasone , bronchodilators, pantoprazole 
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
## 2023-06-25 ENCOUNTER — Telehealth: Payer: Self-pay

## 2023-06-25 ENCOUNTER — Telehealth

## 2023-06-25 DIAGNOSIS — J441 Chronic obstructive pulmonary disease with (acute) exacerbation: Secondary | ICD-10-CM | POA: Diagnosis not present

## 2023-06-25 LAB — BMP8
BUN: 6 mg/dL — ABNORMAL LOW (ref 8–27)
CO2: 24 mmol/L (ref 20–29)
Calcium: 9.2 mg/dL (ref 8.6–10.2)
Chloride: 105 mmol/L (ref 96–106)
Creatinine, Ser: 0.96 mg/dL (ref 0.76–1.27)
Glucose: 59 mg/dL — ABNORMAL LOW (ref 70–99)
Potassium: 3.7 mmol/L (ref 3.5–5.2)
Sodium: 142 mmol/L (ref 134–144)
eGFR: 86 mL/min/{1.73_m2} (ref >59)

## 2023-06-25 LAB — CBC
Hematocrit: 38.6 % (ref 37.5–51.0)
Hemoglobin: 12.7 g/dL — ABNORMAL LOW (ref 13.0–17.7)
MCH: 30 pg (ref 26.6–33.0)
MCHC: 32.9 g/dL (ref 31.5–35.7)
MCV: 91 fL (ref 79–97)
Platelets: 359 10*3/uL (ref 150–450)
RBC: 4.23 x10E6/uL (ref 4.14–5.80)
RDW: 13.5 % (ref 11.6–15.4)
WBC: 5.9 10*3/uL (ref 3.4–10.8)

## 2023-06-25 NOTE — Progress Notes (Signed)
 Internal Medicine Clinic Attending  Case discussed with the resident at the time of the visit.  We reviewed the resident's history and exam and pertinent patient test results.  I agree with the assessment, diagnosis, and plan of care documented in the resident's note.

## 2023-06-27 ENCOUNTER — Ambulatory Visit: Payer: Self-pay | Admitting: Student

## 2023-06-27 NOTE — Progress Notes (Signed)
 CBC stable, Hb improved 11.4 >>12.7

## 2023-06-29 ENCOUNTER — Other Ambulatory Visit: Payer: Self-pay | Admitting: Emergency Medicine

## 2023-06-29 DIAGNOSIS — J432 Centrilobular emphysema: Secondary | ICD-10-CM

## 2023-06-30 ENCOUNTER — Other Ambulatory Visit: Payer: Self-pay

## 2023-06-30 MED ORDER — ROSUVASTATIN CALCIUM 20 MG PO TABS: 20.0000 mg | ORAL_TABLET | Freq: Every day | ORAL | 2 refills | Status: AC

## 2023-06-30 NOTE — Telephone Encounter (Signed)
 Pharmacy requesting a 90 day supply  Medication sent to pharmacy.

## 2023-07-01 ENCOUNTER — Encounter: Payer: Self-pay | Admitting: Gastroenterology

## 2023-07-01 ENCOUNTER — Other Ambulatory Visit

## 2023-07-01 ENCOUNTER — Ambulatory Visit (INDEPENDENT_AMBULATORY_CARE_PROVIDER_SITE_OTHER): Admitting: Gastroenterology

## 2023-07-01 VITALS — BP 122/68 | HR 60 | Ht 69.0 in | Wt 154.0 lb

## 2023-07-01 DIAGNOSIS — R1084 Generalized abdominal pain: Secondary | ICD-10-CM

## 2023-07-01 DIAGNOSIS — R197 Diarrhea, unspecified: Secondary | ICD-10-CM

## 2023-07-01 DIAGNOSIS — R1310 Dysphagia, unspecified: Secondary | ICD-10-CM | POA: Diagnosis not present

## 2023-07-01 DIAGNOSIS — K529 Noninfective gastroenteritis and colitis, unspecified: Secondary | ICD-10-CM | POA: Diagnosis not present

## 2023-07-01 DIAGNOSIS — R630 Anorexia: Secondary | ICD-10-CM | POA: Diagnosis not present

## 2023-07-01 DIAGNOSIS — K3184 Gastroparesis: Secondary | ICD-10-CM | POA: Diagnosis not present

## 2023-07-01 DIAGNOSIS — K219 Gastro-esophageal reflux disease without esophagitis: Secondary | ICD-10-CM

## 2023-07-01 LAB — COMPREHENSIVE METABOLIC PANEL WITH GFR
ALT: 7 U/L (ref 0–53)
AST: 13 U/L (ref 0–37)
Albumin: 4.1 g/dL (ref 3.5–5.2)
Alkaline Phosphatase: 129 U/L — ABNORMAL HIGH (ref 39–117)
BUN: 11 mg/dL (ref 6–23)
CO2: 32 meq/L (ref 19–32)
Calcium: 9.8 mg/dL (ref 8.4–10.5)
Chloride: 102 meq/L (ref 96–112)
Creatinine, Ser: 1.01 mg/dL (ref 0.40–1.50)
GFR: 76.51 mL/min (ref >60.00)
Glucose, Bld: 69 mg/dL — ABNORMAL LOW (ref 70–99)
Potassium: 4 meq/L (ref 3.5–5.1)
Sodium: 141 meq/L (ref 135–145)
Total Bilirubin: 0.6 mg/dL (ref 0.2–1.2)
Total Protein: 7.2 g/dL (ref 6.0–8.3)

## 2023-07-01 LAB — CBC WITH DIFFERENTIAL/PLATELET
Basophils Absolute: 0.1 10*3/uL (ref 0.0–0.1)
Basophils Relative: 1.2 % (ref 0.0–3.0)
Eosinophils Absolute: 0.2 10*3/uL (ref 0.0–0.7)
Eosinophils Relative: 3 % (ref 0.0–5.0)
HCT: 40 % (ref 39.0–52.0)
Hemoglobin: 13.5 g/dL (ref 13.0–17.0)
Lymphocytes Relative: 17.1 % (ref 12.0–46.0)
Lymphs Abs: 1.1 10*3/uL (ref 0.7–4.0)
MCHC: 33.6 g/dL (ref 30.0–36.0)
MCV: 89.2 fl (ref 78.0–100.0)
Monocytes Absolute: 0.9 10*3/uL (ref 0.1–1.0)
Monocytes Relative: 13.3 % — ABNORMAL HIGH (ref 3.0–12.0)
Neutro Abs: 4.4 10*3/uL (ref 1.4–7.7)
Neutrophils Relative %: 65.4 % (ref 43.0–77.0)
Platelets: 344 10*3/uL (ref 150.0–400.0)
RBC: 4.48 Mil/uL (ref 4.22–5.81)
RDW: 15.7 % — ABNORMAL HIGH (ref 11.5–15.5)
WBC: 6.7 10*3/uL (ref 4.0–10.5)

## 2023-07-01 LAB — HIGH SENSITIVITY CRP: CRP, High Sensitivity: 22.18 mg/L — ABNORMAL HIGH (ref 0.000–5.000)

## 2023-07-01 LAB — SEDIMENTATION RATE: Sed Rate: 23 mm/h — ABNORMAL HIGH (ref 0–20)

## 2023-07-01 NOTE — Patient Instructions (Signed)
 Your provider has requested that you go to the basement level for lab work before leaving today. Press "B" on the elevator. The lab is located at the first door on the left as you exit the elevator.  You have been scheduled for a Barium Esophogram at [facility] (1st floor of the hospital) on 07/08/2023 at 2:00pm. Please arrive 30 minutes prior to your appointment for registration. Make certain not to have anything to eat or drink 3 hours prior to your test. If you need to reschedule for any reason, please contact radiology at [phone] to do so. __________________________________________________________________ A barium swallow is an examination that concentrates on views of the esophagus. This tends to be a double contrast exam (barium and two liquids which, when combined, create a gas to distend the wall of the oesophagus) or single contrast (non-ionic iodine based). The study is usually tailored to your symptoms so a good history is essential. Attention is paid during the study to the form, structure and configuration of the esophagus, looking for functional disorders (such as aspiration, dysphagia, achalasia, motility and reflux) EXAMINATION You may be asked to change into a gown, depending on the type of swallow being performed. A radiologist and radiographer will perform the procedure. The radiologist will advise you of the type of contrast selected for your procedure and direct you during the exam. You will be asked to stand, sit or lie in several different positions and to hold a small amount of fluid in your mouth before being asked to swallow while the imaging is performed .In some instances you may be asked to swallow barium coated marshmallows to assess the motility of a solid food bolus. The exam can be recorded as a digital or video fluoroscopy procedure. POST PROCEDURE It will take 1-2 days for the barium to pass through your system. To facilitate this, it is  important, unless otherwise directed, to increase your fluids for the next 24-48hrs and to resume your normal diet.  This test typically takes about 30 minutes to perform. __________________________________________________________________________________
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
## 2023-07-01 NOTE — Patient Instructions (Signed)
 Visit Information  Thank you for taking time to visit with me today. Please don't hesitate to contact me if I can be of assistance to you before our next scheduled appointment.  Your next care management appointment is scheduled for:  07/31/23  2 pm  Please call the care guide team at [phone] if you need to cancel, schedule, or reschedule an appointment.   Please call [phone]-TALK (toll free, 24 hour hotline) if you are experiencing a Mental Health or Behavioral Health Crisis or need someone to talk to.  Augustin Leber RN, BSN, [facility]  [facility], [facility]  Care Coordinator Phone: [phone]
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
## 2023-07-02 ENCOUNTER — Ambulatory Visit: Payer: Self-pay | Admitting: Gastroenterology

## 2023-07-02 ENCOUNTER — Other Ambulatory Visit: Payer: Self-pay

## 2023-07-02 DIAGNOSIS — Z79899 Other long term (current) drug therapy: Secondary | ICD-10-CM | POA: Diagnosis not present

## 2023-07-02 DIAGNOSIS — I10 Essential (primary) hypertension: Secondary | ICD-10-CM | POA: Diagnosis not present

## 2023-07-02 DIAGNOSIS — M5416 Radiculopathy, lumbar region: Secondary | ICD-10-CM | POA: Diagnosis not present

## 2023-07-02 DIAGNOSIS — G8929 Other chronic pain: Secondary | ICD-10-CM | POA: Diagnosis not present

## 2023-07-02 DIAGNOSIS — Z0000 Encounter for general adult medical examination without abnormal findings: Secondary | ICD-10-CM | POA: Diagnosis not present

## 2023-07-02 DIAGNOSIS — M542 Cervicalgia: Secondary | ICD-10-CM | POA: Diagnosis not present

## 2023-07-02 DIAGNOSIS — Z6822 Body mass index (BMI) 22.0-22.9, adult: Secondary | ICD-10-CM | POA: Diagnosis not present

## 2023-07-02 MED ORDER — MESALAMINE 1.2 G PO TBEC: 4.8000 g | DELAYED_RELEASE_TABLET | Freq: Every day | ORAL | 3 refills | Status: DC

## 2023-07-02 MED ORDER — RIFAXIMIN 550 MG PO TABS: 550.0000 mg | ORAL_TABLET | Freq: Three times a day (TID) | ORAL | 0 refills | Status: AC

## 2023-07-02 NOTE — Progress Notes (Signed)
 Attending Physician's Attestation   I have reviewed the chart.   I agree with the Advanced Practitioner's note, impression, and recommendations with any updates as below. Patient with increased risks as result of underlying medical comorbidities.  With that being said, with persistent symptoms make sense for us  to consider strongly endoscopic evaluation.  If we are going to put him through endoscopic evaluation would consider both upper and lower evaluation (this would need to be done in the hospital-based setting).  Agree with workup as outlined in regards to barium imaging.  Would recommend also fecal calprotectin in the obtained.  Would also recommend potentially trying to restart his mesalamine  if he wants to.  There is only so much that we can do if the patient is taking or not taking his medications.  Xifaxan  since it has been helpful in the past for IBS/D treatment can be pursued.   Yong Henle, MD [facility] Advanced Endoscopy Office # [phone]

## 2023-07-06 ENCOUNTER — Encounter: Admitting: Internal Medicine

## 2023-07-08 ENCOUNTER — Telehealth: Payer: Self-pay

## 2023-07-08 ENCOUNTER — Encounter (HOSPITAL_BASED_OUTPATIENT_CLINIC_OR_DEPARTMENT_OTHER): Payer: Self-pay | Admitting: Emergency Medicine

## 2023-07-08 ENCOUNTER — Ambulatory Visit (HOSPITAL_COMMUNITY)
Admission: RE | Admit: 2023-07-08 | Discharge: 2023-07-08 | Disposition: A | Discharge status: Home / Self Care | Source: Ambulatory Visit | Attending: Gastroenterology

## 2023-07-08 ENCOUNTER — Other Ambulatory Visit (HOSPITAL_COMMUNITY): Payer: Self-pay

## 2023-07-08 ENCOUNTER — Emergency Department (HOSPITAL_BASED_OUTPATIENT_CLINIC_OR_DEPARTMENT_OTHER): Admitting: Radiology

## 2023-07-08 ENCOUNTER — Other Ambulatory Visit: Payer: Self-pay

## 2023-07-08 ENCOUNTER — Emergency Department (HOSPITAL_BASED_OUTPATIENT_CLINIC_OR_DEPARTMENT_OTHER)
Admission: EM | Admit: 2023-07-08 | Discharge: 2023-07-08 | Disposition: A | Discharge status: Home / Self Care | Attending: Emergency Medicine | Admitting: Emergency Medicine

## 2023-07-08 ENCOUNTER — Emergency Department (HOSPITAL_BASED_OUTPATIENT_CLINIC_OR_DEPARTMENT_OTHER)

## 2023-07-08 DIAGNOSIS — D72829 Elevated white blood cell count, unspecified: Secondary | ICD-10-CM | POA: Diagnosis not present

## 2023-07-08 DIAGNOSIS — Z85118 Personal history of other malignant neoplasm of bronchus and lung: Secondary | ICD-10-CM | POA: Insufficient documentation

## 2023-07-08 DIAGNOSIS — R197 Diarrhea, unspecified: Secondary | ICD-10-CM | POA: Insufficient documentation

## 2023-07-08 DIAGNOSIS — Z720 Tobacco use: Secondary | ICD-10-CM | POA: Insufficient documentation

## 2023-07-08 DIAGNOSIS — J189 Pneumonia, unspecified organism: Secondary | ICD-10-CM

## 2023-07-08 DIAGNOSIS — J449 Chronic obstructive pulmonary disease, unspecified: Secondary | ICD-10-CM | POA: Diagnosis not present

## 2023-07-08 DIAGNOSIS — J181 Lobar pneumonia, unspecified organism: Secondary | ICD-10-CM | POA: Insufficient documentation

## 2023-07-08 DIAGNOSIS — R630 Anorexia: Secondary | ICD-10-CM | POA: Insufficient documentation

## 2023-07-08 DIAGNOSIS — Z7982 Long term (current) use of aspirin: Secondary | ICD-10-CM | POA: Insufficient documentation

## 2023-07-08 DIAGNOSIS — K529 Noninfective gastroenteritis and colitis, unspecified: Secondary | ICD-10-CM | POA: Diagnosis present

## 2023-07-08 DIAGNOSIS — R1310 Dysphagia, unspecified: Secondary | ICD-10-CM

## 2023-07-08 DIAGNOSIS — R0602 Shortness of breath: Secondary | ICD-10-CM | POA: Diagnosis present

## 2023-07-08 LAB — BASIC METABOLIC PANEL WITH GFR
Anion gap: 15 (ref 5–15)
BUN: 9 mg/dL (ref 8–23)
CO2: 22 mmol/L (ref 22–32)
Calcium: 9.4 mg/dL (ref 8.9–10.3)
Chloride: 100 mmol/L (ref 98–111)
Creatinine, Ser: 1.11 mg/dL (ref 0.61–1.24)
GFR, Estimated: >60 mL/min (ref >60)
Glucose, Bld: 75 mg/dL (ref 70–99)
Potassium: 3.8 mmol/L (ref 3.5–5.1)
Sodium: 136 mmol/L (ref 135–145)

## 2023-07-08 LAB — CBC
HCT: 40.9 % (ref 39.0–52.0)
Hemoglobin: 13.1 g/dL (ref 13.0–17.0)
MCH: 29.5 pg (ref 26.0–34.0)
MCHC: 32 g/dL (ref 30.0–36.0)
MCV: 92.1 fL (ref 80.0–100.0)
Platelets: 331 10*3/uL (ref 150–400)
RBC: 4.44 MIL/uL (ref 4.22–5.81)
RDW: 15 % (ref 11.5–15.5)
WBC: 13.7 10*3/uL — ABNORMAL HIGH (ref 4.0–10.5)
nRBC: 0 % (ref 0.0–0.2)

## 2023-07-08 LAB — RESP PANEL BY RT-PCR (RSV, FLU A&B, COVID)  RVPGX2
Influenza A by PCR: NEGATIVE
Influenza B by PCR: NEGATIVE
Resp Syncytial Virus by PCR: NEGATIVE
SARS Coronavirus 2 by RT PCR: NEGATIVE

## 2023-07-08 LAB — TROPONIN T, HIGH SENSITIVITY: Troponin T High Sensitivity: <15 ng/L (ref <19)

## 2023-07-08 MED ORDER — DOXYCYCLINE HYCLATE 100 MG PO CAPS: 100.0000 mg | ORAL_CAPSULE | Freq: Two times a day (BID) | ORAL | 0 refills | Status: AC

## 2023-07-08 MED ORDER — DOXYCYCLINE HYCLATE 100 MG PO TABS
100.0000 mg | ORAL_TABLET | Freq: Once | ORAL | Status: AC
  Administered 2023-07-08: 100 mg via ORAL
  Filled 2023-07-08: qty 1

## 2023-07-08 MED ORDER — SODIUM CHLORIDE 0.9 % IV SOLN: 1.0000 g | Freq: Once | INTRAVENOUS | Status: DC

## 2023-07-08 MED ORDER — MORPHINE SULFATE (PF) 4 MG/ML IV SOLN
4.0000 mg | Freq: Once | INTRAVENOUS | Status: AC
  Administered 2023-07-08: 4 mg via INTRAVENOUS
  Filled 2023-07-08: qty 1

## 2023-07-08 MED ORDER — IOHEXOL 350 MG/ML SOLN
75.0000 mL | Freq: Once | INTRAVENOUS | Status: AC | PRN
  Administered 2023-07-08: 100 mL via INTRAVENOUS

## 2023-07-08 MED ORDER — IPRATROPIUM-ALBUTEROL 0.5-2.5 (3) MG/3ML IN SOLN
3.0000 mL | Freq: Once | RESPIRATORY_TRACT | Status: AC
  Administered 2023-07-08: 3 mL via RESPIRATORY_TRACT
  Filled 2023-07-08: qty 3

## 2023-07-08 MED ORDER — ACETAMINOPHEN 325 MG PO TABS
650.0000 mg | ORAL_TABLET | Freq: Once | ORAL | Status: DC
  Filled 2023-07-08: qty 2

## 2023-07-08 MED ORDER — ONDANSETRON HCL 4 MG/2ML IJ SOLN
4.0000 mg | Freq: Once | INTRAMUSCULAR | Status: AC
  Administered 2023-07-08: 4 mg via INTRAVENOUS
  Filled 2023-07-08: qty 2

## 2023-07-08 NOTE — Telephone Encounter (Signed)
 Pharmacy Patient Advocate Encounter  Received notification from [facility] Medicare part D that Prior Authorization for  Xifaxan  550MG  tablets has been APPROVED from 07-08-2023 to 07-22-2023   PA #/Case ID/Reference #: [id number]

## 2023-07-08 NOTE — ED Provider Notes (Signed)
 [place] EMERGENCY DEPARTMENT AT [facility] Provider Note   CSN: [id number] Arrival date & time: 07/08/23  1745     History  Chief Complaint  Patient presents with   Shortness of Breath   Chest Pain    [name] is a 68 y.o. male with a history of lung cancer, COPD, and MI who presents the ED today for shortness of breath and chest pain.  Patient reports that when he woke up this morning he was having worsening shortness of breath compared to his normal.  He is on 2 L nasal cannula at baseline and had to increase it to 3.  Also reports centralized chest pain since this morning as well as generalized bodyaches and headaches.  Denies any aggravating or alleviating factors.  No fevers, nausea, or vomiting.  Denies recent sick contact. No recent hospitalizations, surgeries, or long travels.    Home Medications Prior to Admission medications   Medication Sig Start Date End Date Taking? Authorizing Provider  doxycycline  (VIBRAMYCIN ) 100 MG capsule Take 1 capsule (100 mg total) by mouth 2 (two) times daily for 7 days. 07/08/23 07/15/23 Yes Sonnie Dusky, PA-C  albuterol  (VENTOLIN  HFA) 108 (90 Base) MCG/ACT inhaler INHALE 2 PUFFS BY MOUTH EVERY 6 HOURS AS NEEDED FOR WHEEZING FOR SHORTNESS OF BREATH 06/01/23   Denson Flake, MD  Aspirin -Salicylamide-Caffeine (BC HEADACHE POWDER PO) Take by mouth.    [provider]  Atogepant (QULIPTA) 30 MG TABS Take 1 tablet by mouth daily.    [provider]  cetirizine  (ZYRTEC  ALLERGY) 10 MG tablet Take 1 tablet (10 mg total) by mouth daily. 04/16/23 04/15/24  Carleen Chary, DO  escitalopram  (LEXAPRO ) 10 MG tablet Take 1 tablet (10 mg total) by mouth daily. 06/04/23 06/03/24  Malen Scudder, DO  finasteride  (PROSCAR ) 5 MG tablet Take 5 mg by mouth daily.    [provider]  fluticasone  (FLONASE ) 50 MCG/ACT nasal spray Use 2 spray(s) in each nostril once daily Patient taking differently: Place 2 sprays into both nostrils  2 (two) times daily as needed for allergies or rhinitis. 03/04/23   Jayson Michael, MD  Fluticasone -Umeclidin-Vilant (TRELEGY ELLIPTA ) 200-62.5-25 MCG/ACT AEPB INHALE 1 PUFF INTO LUNGS ONCE DAILY 06/29/23   Denson Flake, MD  hydrOXYzine  (ATARAX ) 25 MG tablet Take 1 tablet (25 mg total) by mouth every 6 (six) hours as needed for anxiety. 06/04/23   Malen Scudder, DO  ipratropium (ATROVENT ) 0.03 % nasal spray Place 2 sprays into both nostrils every 12 (twelve) hours. Patient taking differently: Place 2 sprays into both nostrils daily. 05/07/23   Byrum, Robert S, MD  isosorbide  mononitrate (IMDUR ) 60 MG 24 hr tablet Take 1 tablet (60 mg total) by mouth daily. Patient taking differently: Take 60 mg by mouth daily as needed (Chest Pain). 07/29/22 06/08/23  Jude Norton, NP  lidocaine  (LIDODERM ) 5 % Place 1 patch unto chest wall. Remove & Discard patch within 12 hours. 07/21/22   Vita Grip, MD  losartan  (COZAAR ) 25 MG tablet Take 0.5 tablets (12.5 mg total) by mouth daily. 06/04/23   Malen Scudder, DO  mesalamine  (LIALDA ) 1.2 g EC tablet Take 4 tablets (4.8 g total) by mouth daily with breakfast. 07/02/23   Arnette Lansing, Sara E, PA-C  metoCLOPramide  (REGLAN ) 5 MG tablet Take 5 mg by mouth every 8 (eight) hours as needed for nausea.    [provider]  nitroGLYCERIN  (NITROSTAT ) 0.4 MG SL tablet Place 1 tablet (0.4 mg total) under the tongue every  5 (five) minutes as needed for chest pain. 08/19/21 06/08/23  Jude Norton, NP  omeprazole  (PRILOSEC) 40 MG capsule Take 1 capsule (40 mg total) by mouth daily. 11/05/22   Tawkaliyar, Roya, DO  OXYGEN  Inhale 2 L into the lungs continuous.    [provider]  predniSONE  (DELTASONE ) 10 MG tablet Take 1 tablet (10 mg total) by mouth daily with breakfast. 03/04/23   Denson Flake, MD  rifaximin  (XIFAXAN ) 550 MG TABS tablet Take 1 tablet (550 mg total) by mouth 3 (three) times daily for 14 days. 07/02/23 07/16/23  Heinz, Adriane Albe, PA-C  rosuvastatin  (CRESTOR ) 20 MG  tablet Take 1 tablet (20 mg total) by mouth daily. 06/30/23   Nooruddin, Saad, MD  Vibegron (GEMTESA) 75 MG TABS Take 75 mg by mouth daily.    [provider]      Allergies    Patient has no known allergies.    Review of Systems   Review of Systems  Respiratory:  Positive for shortness of breath.   Cardiovascular:  Positive for chest pain.  All other systems reviewed and are negative.   Physical Exam Updated Vital Signs BP 123/82 (BP Location: Left Arm)   Pulse 88   Temp 98.4 F (36.9 C) (Oral)   Resp 16   SpO2 90%  Physical Exam Vitals and nursing note reviewed.  Constitutional:      General: He is not in acute distress.    Appearance: Normal appearance.  HENT:     Head: Normocephalic and atraumatic.     Mouth/Throat:     Mouth: Mucous membranes are moist.  Eyes:     Conjunctiva/sclera: Conjunctivae normal.     Pupils: Pupils are equal, round, and reactive to light.  Cardiovascular:     Rate and Rhythm: Normal rate and regular rhythm.     Pulses: Normal pulses.     Heart sounds: Normal heart sounds.  Pulmonary:     Effort: Pulmonary effort is normal.     Breath sounds: Normal breath sounds.     Comments: On 3L [place], no acute distress. Abdominal:     Palpations: Abdomen is soft.     Tenderness: There is no abdominal tenderness.  Musculoskeletal:        General: Normal range of motion.     Cervical back: Normal range of motion.  Skin:    General: Skin is warm and dry.     Findings: No rash.  Neurological:     General: No focal deficit present.     Mental Status: He is alert.     Sensory: No sensory deficit.     Motor: No weakness.  Psychiatric:        Mood and Affect: Mood normal.        Behavior: Behavior normal.     ED Results / Procedures / Treatments   Labs (all labs ordered are listed, but only abnormal results are displayed) Labs Reviewed  CBC - Abnormal; Notable for the following components:      Result Value   WBC 13.7 (*)    All  other components within normal limits  RESP PANEL BY RT-PCR (RSV, FLU A&B, COVID)  [id number]  BASIC METABOLIC PANEL WITH GFR  TROPONIN T, HIGH SENSITIVITY    EKG EKG Interpretation Date/Time:  Wednesday July 08 2023 17:58:29 EDT Ventricular Rate:  88 PR Interval:  162 QRS Duration:  84 QT Interval:  336 QTC Calculation: 406 R Axis:   -67  Text  Interpretation: Sinus rhythm with occasional Premature ventricular complexes Left anterior fascicular block Nonspecific ST abnormality Abnormal ECG Confirmed by Shyrl Doyne [phone]) on 07/08/2023 6:43:20 PM  Radiology CT Angio Chest PE W and/or Wo Contrast Result Date: 07/08/2023 CLINICAL DATA:  High probability for PE. COPD and shortness of breath. Chest pain. EXAM: CT ANGIOGRAPHY CHEST WITH CONTRAST TECHNIQUE: Multidetector CT imaging of the chest was performed using the standard protocol during bolus administration of intravenous contrast. Multiplanar CT image reconstructions and MIPs were obtained to evaluate the vascular anatomy. RADIATION DOSE REDUCTION: This exam was performed according to the departmental dose-optimization program which includes automated exposure control, adjustment of the mA and/or kV according to patient size and/or use of iterative reconstruction technique. CONTRAST:  OMNIPAQUE  IOHEXOL  350 MG/ML SOLN COMPARISON:  CT chest abdomen and pelvis 06/08/2023. FINDINGS: Cardiovascular: Satisfactory opacification of the pulmonary arteries to the segmental level. No evidence of pulmonary embolism. Normal heart size. No pericardial effusion. There are atherosclerotic calcifications of the aorta and coronary arteries. Mediastinum/Nodes: No enlarged mediastinal, hilar, or axillary lymph nodes. Thyroid  gland, trachea, and esophagus demonstrate no significant findings. Lungs/Pleura: Bilobed nodular density in the right upper lobe measuring 8 and 9 mm appears unchanged from the prior study. There is a stable 14 mm left upper lobe nodule.  There are numerous other nodular densities scattered throughout both lungs measuring up to 6 mm which are similar to prior. Patchy airspace opacities, peribronchial wall thickening and interstitial thickening appears unchanged in the right lower lobe/right lung base. There is a new focal nodular ill-defined area of airspace opacity in the superior segment of the right lower lobe measuring 1.9 x 2.2 cm image 6/81 mild emphysema and scarring appears stable. No pleural effusion or pneumothorax. Upper Abdomen: No acute abnormality. Musculoskeletal: Cervical spinal fusion plate is present. No acute fracture or focal osseous lesion. Review of the MIP images confirms the above findings. IMPRESSION: 1. No evidence for pulmonary embolism. 2. New focal nodular ill-defined area of airspace opacity in the superior segment of the right lower lobe measuring 1.9 x 2.2 cm. Findings may be infectious/inflammatory, but neoplasm is not excluded. 3. Stable patchy airspace opacities, peribronchial wall thickening and interstitial thickening in the right lower lobe/right lung base. 4. Stable bilateral pulmonary nodules measuring up to 14 mm. Aortic Atherosclerosis (ICD10-I70.0) and Emphysema (ICD10-J43.9). Electronically Signed   By: Tyron Gallon M.D.   On: 07/08/2023 19:36   DG Chest 2 View Result Date: 07/08/2023 CLINICAL DATA:  Chest pain EXAM: CHEST - 2 VIEW COMPARISON:  CT of the chest 06/08/2023 FINDINGS: Bilateral upper lobe nodules are similar to the prior study. There is a new focal nodular density in the right mid lung measuring 15 mm. There is atelectasis or scarring in both lung bases. There is no pleural effusion or pneumothorax. Left hilar surgical clips are present. The cardiomediastinal silhouette is within normal limits. No acute fractures are seen. IMPRESSION: 1. New focal nodular density in the right mid lung measuring 15 mm. Recommend further evaluation with CT of the chest. 2. Bilateral upper lobe nodules are  similar to the prior study. Electronically Signed   By: Tyron Gallon M.D.   On: 07/08/2023 18:20   DG ESOPHAGUS W DOUBLE CM (HD) Result Date: 07/08/2023 CLINICAL DATA:  68 year old male with history of dysphagia, globus sensation, chronic colitis, and diarrhea. EXAM: ESOPHAGUS/BARIUM SWALLOW/TABLET STUDY TECHNIQUE: Combined double and single contrast examination was performed using effervescent crystals, high-density barium, and thin liquid barium. This exam was performed  by Lambert Pillion, PA-C, and was supervised and interpreted by Shearon Denis, MD. FLUOROSCOPY: Radiation Exposure Index (as provided by the fluoroscopic device): 15.90 mGy Kerma COMPARISON:  DG esophagram double contrast media on 05/01/2021. FINDINGS: Swallowing: Appears normal. No vestibular penetration or aspiration seen. Pharynx: Unremarkable. Esophagus: Normal appearance. Esophageal motility: Within normal limits. Hiatal Hernia: None. Gastroesophageal reflux: Minimal reflux at distal esophagus noted, best seen on upright imaging. Ingested 13mm barium tablet: Passed normally. Other: Patient with cervical and occipital fixation hardware. IMPRESSION: Minimal gastroesophageal reflux noted. Otherwise, normal esophagram study. Electronically Signed   By: Shearon Denis M.D.   On: 07/08/2023 15:00    Procedures Procedures    Medications Ordered in ED Medications  acetaminophen  (TYLENOL ) tablet 650 mg (0 mg Oral Hold 07/08/23 1958)  ipratropium-albuterol  (DUONEB) 0.5-2.5 (3) MG/3ML nebulizer solution 3 mL (3 mLs Nebulization Given 07/08/23 1931)  iohexol  (OMNIPAQUE ) 350 MG/ML injection 75 mL (100 mLs Intravenous Contrast Given 07/08/23 1918)  morphine  (PF) 4 MG/ML injection 4 mg (4 mg Intravenous Given 07/08/23 1953)  ondansetron  (ZOFRAN ) injection 4 mg (4 mg Intravenous Given 07/08/23 1953)  doxycycline  (VIBRA -TABS) tablet 100 mg (100 mg Oral Given 07/08/23 2115)    ED Course/ Medical Decision Making/ A&P                                  Medical Decision Making Amount and/or Complexity of Data Reviewed Labs: ordered. Radiology: ordered.  Risk OTC drugs. Prescription drug management.   This patient presents to the ED for concern of shortness of breath and chest pain, this involves an extensive number of treatment options, and is a complaint that carries with it a high risk of complications and morbidity.   Differential diagnosis includes: ACS, PE, pneumonia, viral illness, pleurisy, costochondritis, muscle strain, etc.   Comorbidities  See HPI above   Additional History  Additional history obtained from prior records   Cardiac Monitoring / EKG  The patient was maintained on a cardiac monitor.  I personally viewed and interpreted the cardiac monitored which showed: sinus rhythm with occasional PVCs, heart rate of 88 bpm.   Lab Tests  I ordered and personally interpreted labs.  The pertinent results include:   Negative respiratory panel Troponin <15 BMP is unremarkable  WBC of 13.7, otherwise CBC is unremarkable   Imaging Studies  I ordered imaging studies including CXR and CTA PE study  I independently visualized and interpreted imaging which showed:  CXR shows new focal nodular density in right midlung, measuring 15 mm.  CT of the chest recommended.  Bilateral upper lobe nodules, similar to prior study. CTA shows no evidence of PE. Focal nodular ill-defined area of airspace opacity in the superior segment of right lower lobe measuring 1.9 x 2.2 cm.  Findings may be infectious/inflammatory, neoplasm not excluded. I agree with the radiologist interpretation   Problem List / ED Course / Critical Interventions / Medication Management  Patient reports waking up with shortness of breath, worse than his baseline, and chest pain.  Is on 2 L nasal cannula at baseline, which he had up to 3 today, which per his pulmonary notes he normally fluctuates between 2-3L oxygen .  Also reports generalized bodyaches and  a headache.  No nausea, vomiting, or diarrhea.  No abdominal pain. DuoNeb was given with some improvement of patient's symptoms.  He states that he felt better after morphine  and Zofran .  Patient was able to ambulate  around the ED with oxygen  saturations remaining at 85% and above.  Denies any shortness of breath or acute distress with ambulation. Discussed findings with patient.  Will start on doxycycline  for possible pneumonia.  Patient is agreeable with this plan.  Instructed to follow-up with pulmonology for further evaluation of the possibly malignant lung nodule.   Social Determinants of Health  Tobacco use   Test / Admission - Considered  Patient is stable and safe for discharge home. Return precautions given.       Final Clinical Impression(s) / ED Diagnoses Final diagnoses:  Pneumonia of right middle lobe due to infectious organism    Rx / DC Orders ED Discharge Orders          Ordered    doxycycline  (VIBRAMYCIN ) 100 MG capsule  2 times daily        07/08/23 2110              Sonnie Dusky, PA-C 07/08/23 2345    Ninetta Basket, MD 07/10/23 [phone]

## 2023-07-08 NOTE — ED Triage Notes (Signed)
 Generalized body aches, headaches. COPD patient on 2LPM via [place]. Worse SOB today. Endorses CP.

## 2023-07-08 NOTE — ED Notes (Signed)
 Patient transported to CT

## 2023-07-08 NOTE — Discharge Instructions (Addendum)
 We are treating you for pneumonia.  Take doxycycline  twice a day for the next week.  Take this medication with food as it can cause stomach upset if taken on empty stomach.  Follow-up with your pulmonologist in the next week for reevaluation.  Get help right away if: You are short of breath and this gets worse. You have more chest pain. Your sickness gets worse. This is very serious if: You are an older adult. Your body's defense system is weak. You cough up blood.

## 2023-07-08 NOTE — ED Notes (Signed)
 Patient ambulated down hallway on 2LNC (baseline). Patient maintained saturations of 89% throughout ambulation, without increased WOB noted. Patient able to hold conversation while ambulating. SpO2 dropped for brief moment of time to 87%. Patient rebounded to 90% within 30 seconds of rest. EDP aware.

## 2023-07-08 NOTE — Telephone Encounter (Signed)
 Pharmacy Patient Advocate Encounter   Received notification from CoverMyMeds that prior authorization for Xifaxan  550MG  tablets is required/requested.   Insurance verification completed.   The patient is insured through [facility] Medicare Part D .   Per test claim: PA required; PA submitted to above mentioned insurance via CoverMyMeds Key/confirmation #/EOC [id number] Status is pending

## 2023-07-15 NOTE — Telephone Encounter (Signed)
 Attempted to reach patient to discuss provider message. Patient had Rx for Xifaxan  sent in on 5/29. Unsure if he ever took this medication. Will discuss further upon call back.

## 2023-07-16 ENCOUNTER — Telehealth: Payer: Self-pay | Admitting: Gastroenterology

## 2023-07-16 NOTE — Telephone Encounter (Signed)
 Returned call to patient, he reports that he does have the Xifaxan . Will start taking them this morning. Closing this phone note, see previous encounter for further documentation.

## 2023-07-16 NOTE — Telephone Encounter (Signed)
 Inbound call from patient requesting to speak with Brandi further about Xifaxan  medication that was previously discussed in 5/29 telephone note. Please advise, thank you.

## 2023-07-17 ENCOUNTER — Telehealth: Payer: Self-pay

## 2023-07-17 NOTE — Telephone Encounter (Signed)
   Pre-operative Risk Assessment    Patient Name: [name]  DOB: October 12, 1955 MRN: [id number]   Date of last office visit: 07/29/22 Marlana Silvan, NP Date of next office visit: NONE   Request for Surgical Clearance    Procedure:  HERNIA SURGERY  Date of Surgery:  Clearance TBD                                Surgeon:  Teddie Favre, MD Surgeon's Group or Practice Name:  [facility] Phone number:  [phone] Fax number:  [phone]  ATTN: Eduardo Grade, CMA   Type of Clearance Requested:   - Medical    Type of Anesthesia:  General    Additional requests/questions:    Signed, Collin Deal   07/17/2023, 8:27 AM

## 2023-07-17 NOTE — Telephone Encounter (Signed)
 Pt is scheduled for IN OFFICE Preop appt 09/03/23 with Marlana Silvan, NP  Will update surgeons office.

## 2023-07-17 NOTE — Telephone Encounter (Signed)
 Left message for pt to call our office and schedule IN OFFICE Preop appt.

## 2023-07-20 ENCOUNTER — Ambulatory Visit: Admitting: Licensed Clinical Social Worker

## 2023-07-20 ENCOUNTER — Encounter: Admitting: Student

## 2023-07-20 ENCOUNTER — Telehealth: Payer: Self-pay

## 2023-07-20 DIAGNOSIS — F32A Depression, unspecified: Secondary | ICD-10-CM

## 2023-07-20 DIAGNOSIS — F419 Anxiety disorder, unspecified: Secondary | ICD-10-CM

## 2023-07-20 NOTE — Telephone Encounter (Signed)
 I spoke with [name] and he said he has been taking it and it helps with his nausea. Please advise if I can refill, thank you.

## 2023-07-20 NOTE — Telephone Encounter (Signed)
 Patient seen recently. Pharmacy requesting refill on his generic reglan . Please advise, Thanks.

## 2023-07-20 NOTE — BH Specialist Note (Signed)
 Integrated Behavioral Health via Telemedicine Visit  07/20/2023 [name] [id number]  Number of Integrated Behavioral Health Clinician visits: Additional Visit  Session Start time: 1330   Session End time: 1345  Total time in minutes: 15    Referring Provider: PCP Patient/Family location: Home [facility] Provider location: Office All persons participating in visit: [facility] and Patient Types of Service: Telephone visit  I connected with Nathalie Baize  via  Telephone or Video Enabled Telemedicine Application  (Video is Caregility application) and verified that I am speaking with the correct person using two identifiers. Discussed confidentiality: Yes   I discussed the limitations of telemedicine and the availability of in person appointments.  Discussed there is a possibility of technology failure and discussed alternative modes of communication if that failure occurs.  I discussed that engaging in this telemedicine visit, they consent to the provision of behavioral healthcare and the services will be billed under their insurance.  Patient and/or legal guardian expressed understanding and consented to Telemedicine visit: Yes   Presenting Concerns: Patient and/or family reports the following symptoms/concerns: Patient contacted office to change appointment from In-person to telehealth. [facility] contacted patient via telephone and spoke briefly. Patient wasn't feeling well as he recently been prescribed antibiotics. Patient continues receiving care from his nurse aid and while on our call his nurse aid attempted him. [facility] reviewed patients upcoming appointments and scheduled next session for 07/10; in-person at 1:30 pm. Nothing additional discussed in encounter.   Duration of problem: less than two weeks; Severity of problem: moderate  Patient and/or Family's Strengths/Protective Factors: Sense of purpose  Goals Addressed: Patient will:  Reduce symptoms of: depression   Progress towards  Goals: Ongoing    Interventions: Interventions utilized:  Supportive Counseling Standardized Assessments completed: Not Needed    Patient and/or Family Response: Patient continues benefiting from individual counseling.  Clinical Assessment/Diagnosis  Anxiety and depression    Assessment: Patient currently experiencing Anxiety and Depression.   Patient may benefit from Ongoing Individual therapy.  Plan: Follow up with behavioral health clinician on : 08/13/2023 at 1:30 pm  I discussed the assessment and treatment plan with the patient and/or parent/guardian. They were provided an opportunity to ask questions and all were answered. They agreed with the plan and demonstrated an understanding of the instructions.   They were advised to call back or seek an in-person evaluation if the symptoms worsen or if the condition fails to improve as anticipated.  Amie Bald, MSW, LCSW-A She/Her Behavioral Health Clinician [facility] Direct Dial:[phone]  Fax [phone] Main Office Phone: [phone] [address] [place] [place], [place] [address] Website: [facility]  [place], [place]  [place]

## 2023-07-22 MED ORDER — METOCLOPRAMIDE HCL 5 MG PO TABS: 5.0000 mg | ORAL_TABLET | Freq: Three times a day (TID) | ORAL | 0 refills | Status: DC | PRN

## 2023-07-22 NOTE — Telephone Encounter (Signed)
 Patient returned call. Advised of previous note.

## 2023-07-22 NOTE — Telephone Encounter (Signed)
 I left [name] a detailed phone message on his only # that the generic reglan  has been sent in and that he has an upcoming appointment with Valiant Gaul, PA on 08/20/2023 at 3:30pm.

## 2023-07-23 ENCOUNTER — Telehealth: Payer: Self-pay | Admitting: Pharmacist

## 2023-07-23 NOTE — Progress Notes (Signed)
   07/23/2023  Patient ID: Nathalie Baize, male   DOB: February 11, 1955, 68 y.o.   MRN: [id number]  Called and spoke with the patient on the phone today for medication review. Not much has changed medication-wise since our last discussion.  Waiting on clearance for umbilical hernia surgery. Advised he has a pulm on 7/30 and cardio on 7/31 for surgery clearance.  For pain, newly started on Hydrocodone -acetaminophen  for as-needed use. Every time he's used it, he gets constipation however, despite use of Benefiber. Plans to discuss at follow-up some more.   Do not believe he is taking anything for mood consistently at this time, which could be recommended.  Breathing is about the same since having pneumonia and continues inhaler use as prescribed.   No need for follow-up at this time. Seeing many providers at this point.   Delvin File, PharmD [facility]  Phone Number: [phone]

## 2023-07-27 ENCOUNTER — Telehealth: Payer: Self-pay | Admitting: Emergency Medicine

## 2023-07-27 NOTE — Telephone Encounter (Signed)
 Fax received from Dr. Lyndel with [facility] to perform a hernia repair under general anesthesia on patient.  Patient needs surgery clearance. Surgery is pending. Patient was seen on 05/07/23. Office protocol is a risk assessment can be sent to surgeon if patient has been seen in 60 days or less.   He has upcoming visit with Dr. Shelah on 09/02/23. Will add to appt notes that risk assessment is needed. Routing back to clearance pool until visit is complete.

## 2023-07-28 ENCOUNTER — Other Ambulatory Visit (HOSPITAL_BASED_OUTPATIENT_CLINIC_OR_DEPARTMENT_OTHER): Payer: Self-pay

## 2023-07-28 NOTE — BH Specialist Note (Signed)
 Integrated Behavioral Health Follow Up In-Person Visit  MRN: [id number] Name: [name]  Number of Integrated Behavioral Health Clinician visits: Additional Visit  Session Start time: 1430   Session End time: 1530  Total time in minutes: 60    Types of Service: Individual psychotherapy and General Behavioral Integrated Care (BHI)   Interpretor:No. Interpretor Name and Language: N/A   Subjective: CRISANTO NIED is a 68 y.o. male  Patient was referred by PCP for [facility]. Patient reports the following symptoms/concerns: Session was spent discussing patients current feelings toward his care and over life. Therapist and Patient reflected on therapist educated patient on strength based practice and  Finding good. Patient demonstrated understanding of skill and will incorporate into his every day practice. Patients upcoming appointments were reviewed. Patient expressed feeling of being overwhelmed with multiple appointments. Therapist validated feelings and used strength based practice to help patient see the good in having a reliable care team.  The session concluded with the scheduling of a follow-up appointment. The patient remains engaged in behavioral health services and continues to derive benefit from therapeutic support focused on care navigation and symptom management.       Objective: Mood: Anxious and Depressed and Affect: Appropriate Risk of harm to self or others: No plan to harm self or others       Patient and/or Family's Strengths/Protective Factors: Sense of purpose   Goals Addressed: Patient will:  Reduce symptoms of: anxiety and depression   Increase knowledge and/or ability of: healthy habits   Demonstrate ability to: Increase healthy adjustment to current life circumstances   Diagnosis: Anxiety and Depression  Progress towards Goals: Ongoing   Interventions: Interventions utilized:  Mindfulness or Relaxation Training and CBT Cognitive Behavioral  Therapy   Patient and/or Family Response: Patient continues to enjoy therapy.   Patient Centered Plan: Patient is on the following Treatment Plan(s): OPT Assessment: Patient currently experiencing Anxiety and Depression due to medical diagnosis..    Patient may benefit from ongoing OPT.   Plan: Follow up with behavioral health clinician on :06/16 ; In-person   [name] Pontes, MSW, LCSW-A She/Her Behavioral Health Clinician [facility]  Internal Medicine Center

## 2023-07-29 ENCOUNTER — Emergency Department (HOSPITAL_COMMUNITY)

## 2023-07-29 ENCOUNTER — Other Ambulatory Visit: Payer: Self-pay

## 2023-07-29 ENCOUNTER — Inpatient Hospital Stay (HOSPITAL_COMMUNITY)
Admission: EM | Admit: 2023-07-29 | Discharge: 2023-08-01 | DRG: 177 | Disposition: A | Discharge status: Home / Self Care | Attending: Internal Medicine | Admitting: Internal Medicine

## 2023-07-29 ENCOUNTER — Encounter (HOSPITAL_COMMUNITY): Payer: Self-pay

## 2023-07-29 DIAGNOSIS — Z7952 Long term (current) use of systemic steroids: Secondary | ICD-10-CM

## 2023-07-29 DIAGNOSIS — Z85118 Personal history of other malignant neoplasm of bronchus and lung: Secondary | ICD-10-CM

## 2023-07-29 DIAGNOSIS — F32A Depression, unspecified: Secondary | ICD-10-CM | POA: Diagnosis present

## 2023-07-29 DIAGNOSIS — J9621 Acute and chronic respiratory failure with hypoxia: Secondary | ICD-10-CM | POA: Diagnosis present

## 2023-07-29 DIAGNOSIS — Z66 Do not resuscitate: Secondary | ICD-10-CM | POA: Diagnosis present

## 2023-07-29 DIAGNOSIS — Z79899 Other long term (current) drug therapy: Secondary | ICD-10-CM

## 2023-07-29 DIAGNOSIS — I700 Atherosclerosis of aorta: Secondary | ICD-10-CM | POA: Diagnosis present

## 2023-07-29 DIAGNOSIS — Z85528 Personal history of other malignant neoplasm of kidney: Secondary | ICD-10-CM

## 2023-07-29 DIAGNOSIS — I444 Left anterior fascicular block: Secondary | ICD-10-CM | POA: Diagnosis present

## 2023-07-29 DIAGNOSIS — J441 Chronic obstructive pulmonary disease with (acute) exacerbation: Secondary | ICD-10-CM | POA: Diagnosis present

## 2023-07-29 DIAGNOSIS — E785 Hyperlipidemia, unspecified: Secondary | ICD-10-CM | POA: Diagnosis present

## 2023-07-29 DIAGNOSIS — I252 Old myocardial infarction: Secondary | ICD-10-CM

## 2023-07-29 DIAGNOSIS — Z905 Acquired absence of kidney: Secondary | ICD-10-CM

## 2023-07-29 DIAGNOSIS — R0602 Shortness of breath: Secondary | ICD-10-CM | POA: Diagnosis not present

## 2023-07-29 DIAGNOSIS — Z9981 Dependence on supplemental oxygen: Secondary | ICD-10-CM

## 2023-07-29 DIAGNOSIS — I2510 Atherosclerotic heart disease of native coronary artery without angina pectoris: Secondary | ICD-10-CM | POA: Diagnosis present

## 2023-07-29 DIAGNOSIS — E559 Vitamin D deficiency, unspecified: Secondary | ICD-10-CM | POA: Diagnosis present

## 2023-07-29 DIAGNOSIS — Z8701 Personal history of pneumonia (recurrent): Secondary | ICD-10-CM

## 2023-07-29 DIAGNOSIS — F419 Anxiety disorder, unspecified: Secondary | ICD-10-CM | POA: Diagnosis present

## 2023-07-29 DIAGNOSIS — U071 COVID-19: Principal | ICD-10-CM | POA: Diagnosis present

## 2023-07-29 DIAGNOSIS — J439 Emphysema, unspecified: Secondary | ICD-10-CM | POA: Diagnosis present

## 2023-07-29 DIAGNOSIS — Z87891 Personal history of nicotine dependence: Secondary | ICD-10-CM

## 2023-07-29 DIAGNOSIS — E875 Hyperkalemia: Secondary | ICD-10-CM | POA: Diagnosis not present

## 2023-07-29 DIAGNOSIS — Z7951 Long term (current) use of inhaled steroids: Secondary | ICD-10-CM

## 2023-07-29 DIAGNOSIS — Z1152 Encounter for screening for COVID-19: Secondary | ICD-10-CM

## 2023-07-29 DIAGNOSIS — I10 Essential (primary) hypertension: Secondary | ICD-10-CM | POA: Diagnosis present

## 2023-07-29 DIAGNOSIS — K469 Unspecified abdominal hernia without obstruction or gangrene: Secondary | ICD-10-CM | POA: Diagnosis present

## 2023-07-29 LAB — CBC
HCT: 43 % (ref 39.0–52.0)
Hemoglobin: 13.5 g/dL (ref 13.0–17.0)
MCH: 29.7 pg (ref 26.0–34.0)
MCHC: 31.4 g/dL (ref 30.0–36.0)
MCV: 94.5 fL (ref 80.0–100.0)
Platelets: 338 10*3/uL (ref 150–400)
RBC: 4.55 MIL/uL (ref 4.22–5.81)
RDW: 15.9 % — ABNORMAL HIGH (ref 11.5–15.5)
WBC: 7.6 10*3/uL (ref 4.0–10.5)
nRBC: 0 % (ref 0.0–0.2)

## 2023-07-29 LAB — BRAIN NATRIURETIC PEPTIDE: B Natriuretic Peptide: 30.2 pg/mL (ref 0.0–100.0)

## 2023-07-29 LAB — RESP PANEL BY RT-PCR (RSV, FLU A&B, COVID)  RVPGX2
Influenza A by PCR: NEGATIVE
Influenza B by PCR: NEGATIVE
Resp Syncytial Virus by PCR: NEGATIVE
SARS Coronavirus 2 by RT PCR: POSITIVE — AB

## 2023-07-29 LAB — BLOOD GAS, VENOUS
Acid-Base Excess: 0.8 mmol/L (ref 0.0–2.0)
Bicarbonate: 27.5 mmol/L (ref 20.0–28.0)
O2 Saturation: 26.3 %
Patient temperature: 37
pCO2, Ven: 51 mmHg (ref 44–60)
pH, Ven: 7.34 (ref 7.25–7.43)
pO2, Ven: <31 mmHg — CL (ref 32–45)

## 2023-07-29 LAB — BASIC METABOLIC PANEL WITH GFR
Anion gap: 10 (ref 5–15)
BUN: 14 mg/dL (ref 8–23)
CO2: 21 mmol/L — ABNORMAL LOW (ref 22–32)
Calcium: 9.2 mg/dL (ref 8.9–10.3)
Chloride: 103 mmol/L (ref 98–111)
Creatinine, Ser: 1.29 mg/dL — ABNORMAL HIGH (ref 0.61–1.24)
GFR, Estimated: >60 mL/min (ref >60)
Glucose, Bld: 95 mg/dL (ref 70–99)
Potassium: 4 mmol/L (ref 3.5–5.1)
Sodium: 134 mmol/L — ABNORMAL LOW (ref 135–145)

## 2023-07-29 LAB — MAGNESIUM: Magnesium: 1.8 mg/dL (ref 1.7–2.4)

## 2023-07-29 MED ORDER — IOHEXOL 350 MG/ML SOLN
80.0000 mL | Freq: Once | INTRAVENOUS | Status: AC | PRN
  Administered 2023-07-29: 75 mL via INTRAVENOUS

## 2023-07-29 MED ORDER — IPRATROPIUM-ALBUTEROL 0.5-2.5 (3) MG/3ML IN SOLN
3.0000 mL | Freq: Once | RESPIRATORY_TRACT | Status: AC
  Administered 2023-07-29: 3 mL via RESPIRATORY_TRACT
  Filled 2023-07-29: qty 3

## 2023-07-29 MED ORDER — ACETAMINOPHEN 325 MG PO TABS
650.0000 mg | ORAL_TABLET | Freq: Once | ORAL | Status: AC
  Administered 2023-07-29: 650 mg via ORAL
  Filled 2023-07-29: qty 2

## 2023-07-29 MED ORDER — METHYLPREDNISOLONE SODIUM SUCC 125 MG IJ SOLR
125.0000 mg | Freq: Once | INTRAMUSCULAR | Status: AC
  Administered 2023-07-29: 125 mg via INTRAVENOUS
  Filled 2023-07-29: qty 2

## 2023-07-29 MED ORDER — LACTATED RINGERS IV BOLUS
500.0000 mL | Freq: Once | INTRAVENOUS | Status: AC
  Administered 2023-07-30: 500 mL via INTRAVENOUS

## 2023-07-29 MED ORDER — KETOROLAC TROMETHAMINE 15 MG/ML IJ SOLN
15.0000 mg | Freq: Once | INTRAMUSCULAR | Status: AC
  Administered 2023-07-29: 15 mg via INTRAVENOUS
  Filled 2023-07-29: qty 1

## 2023-07-29 NOTE — ED Provider Notes (Addendum)
 [place] EMERGENCY DEPARTMENT AT [facility] Provider Note   CSN: [id number] Arrival date & time: 07/29/23  1118     Patient presents with: Shortness of Breath, Generalized Body Aches, Cough, and Fatigue   [name] is a 68 y.o. male.    Shortness of Breath Associated symptoms: cough and fever   Cough Associated symptoms: chills, fever, myalgias and shortness of breath   Patient presents for shortness of breath.  Medical history includes COPD, lung cancer, chronic pain, HLD, anxiety, depression.  Onset of dyspnea was several days ago.  He has had subjective fevers, chills, fatigue, diffuse myalgias, as well as a productive cough.  Sputum is described as green at times with red tinge during other times.  He wears 2 L of oxygen  at baseline.  He does use nebulizers at home occasionally.     Prior to Admission medications   Medication Sig Start Date End Date Taking? Authorizing Provider  albuterol  (VENTOLIN  HFA) 108 (90 Base) MCG/ACT inhaler INHALE 2 PUFFS BY MOUTH EVERY 6 HOURS AS NEEDED FOR WHEEZING FOR SHORTNESS OF BREATH 06/01/23   Shelah Lamar RAMAN, MD  Aspirin -Salicylamide-Caffeine (BC HEADACHE POWDER PO) Take by mouth.    [provider]  Atogepant (QULIPTA) 30 MG TABS Take 1 tablet by mouth daily.    [provider]  cetirizine  (ZYRTEC  ALLERGY) 10 MG tablet Take 1 tablet (10 mg total) by mouth daily. 04/16/23 04/15/24  Harrie Bruckner, DO  escitalopram  (LEXAPRO ) 10 MG tablet Take 1 tablet (10 mg total) by mouth daily. Patient not taking: Reported on 07/23/2023 06/04/23 06/03/24  Addie Perkins, DO  finasteride  (PROSCAR ) 5 MG tablet Take 5 mg by mouth daily.    [provider]  fluticasone  (FLONASE ) 50 MCG/ACT nasal spray Use 2 spray(s) in each nostril once daily Patient taking differently: Place 2 sprays into both nostrils 2 (two) times daily as needed for allergies or rhinitis. 03/04/23   Francella Rogue, MD  Fluticasone -Umeclidin-Vilant  (TRELEGY ELLIPTA ) 200-62.5-25 MCG/ACT AEPB INHALE 1 PUFF INTO LUNGS ONCE DAILY 06/29/23   Shelah Lamar RAMAN, MD  HYDROcodone -acetaminophen  (NORCO) 10-325 MG tablet Take 1 tablet by mouth 3 (three) times daily as needed. 07/17/23   [provider]  hydrOXYzine  (ATARAX ) 25 MG tablet Take 1 tablet (25 mg total) by mouth every 6 (six) hours as needed for anxiety. 06/04/23   Addie Perkins, DO  ipratropium (ATROVENT ) 0.03 % nasal spray Place 2 sprays into both nostrils every 12 (twelve) hours. Patient taking differently: Place 2 sprays into both nostrils daily. 05/07/23   Byrum, Robert S, MD  ipratropium-albuterol  (DUONEB) 0.5-2.5 (3) MG/3ML SOLN USE 1 AMPULE IN NEBULIZER EVERY 6 HOURS AS NEEDED    [provider]  isosorbide  mononitrate (IMDUR ) 60 MG 24 hr tablet Take 1 tablet (60 mg total) by mouth daily. Patient taking differently: Take 60 mg by mouth daily as needed (Chest Pain). 07/29/22 06/08/23  Daneen Perkins BROCKS, NP  ketoconazole  (NIZORAL ) 2 % shampoo Apply 1 Application topically. 02/19/23   [provider]  lidocaine  (LIDODERM ) 5 % Place 1 patch unto chest wall. Remove & Discard patch within 12 hours. 07/21/22   Lou Claretta HERO, MD  losartan  (COZAAR ) 25 MG tablet Take 0.5 tablets (12.5 mg total) by mouth daily. 06/04/23   Addie Perkins, DO  mesalamine  (LIALDA ) 1.2 g EC tablet Take 4 tablets (4.8 g total) by mouth daily with breakfast. 07/02/23   Arletta Camie BRAVO, PA-C  metoCLOPramide  (REGLAN ) 5 MG tablet Take 1 tablet (  5 mg total) by mouth every 8 (eight) hours as needed for nausea. 07/22/23   Heinz, Camie BRAVO, PA-C  nitroGLYCERIN  (NITROSTAT ) 0.4 MG SL tablet Place 1 tablet (0.4 mg total) under the tongue every 5 (five) minutes as needed for chest pain. 08/19/21 06/08/23  Daneen Damien BROCKS, NP  omeprazole  (PRILOSEC) 40 MG capsule Take 1 capsule (40 mg total) by mouth daily. 11/05/22   Tawkaliyar, Roya, DO  OXYGEN  Inhale 2 L into the lungs continuous.    [provider]  predniSONE  (DELTASONE )  10 MG tablet Take 1 tablet (10 mg total) by mouth daily with breakfast. 03/04/23   Shelah Lamar RAMAN, MD  rosuvastatin  (CRESTOR ) 20 MG tablet Take 1 tablet (20 mg total) by mouth daily. 06/30/23   Nooruddin, Saad, MD  Vibegron (GEMTESA) 75 MG TABS Take 75 mg by mouth daily.    [provider]  XIFAXAN  550 MG TABS tablet Take 550 mg by mouth 3 (three) times daily. 07/14/23   [provider]    Allergies: Patient has no known allergies.    Review of Systems  Constitutional:  Positive for chills, fatigue and fever.  Respiratory:  Positive for cough and shortness of breath.   Musculoskeletal:  Positive for myalgias.  All other systems reviewed and are negative.   Updated Vital Signs BP (!) 116/97   Pulse 84   Temp 99 F (37.2 C) (Oral)   Resp (!) 25   Ht 5' 9 (1.753 m)   Wt 69.9 kg   SpO2 94%   BMI 22.76 kg/m   Physical Exam Vitals and nursing note reviewed.  Constitutional:      General: He is not in acute distress.    Appearance: He is well-developed. He is not ill-appearing, toxic-appearing or diaphoretic.  HENT:     Head: Normocephalic and atraumatic.     Mouth/Throat:     Mouth: Mucous membranes are moist.   Eyes:     Conjunctiva/sclera: Conjunctivae normal.    Cardiovascular:     Rate and Rhythm: Normal rate and regular rhythm.     Heart sounds: No murmur heard. Pulmonary:     Effort: Pulmonary effort is normal. No tachypnea or respiratory distress.     Breath sounds: Decreased breath sounds present.  Chest:     Chest wall: No tenderness.  Abdominal:     Palpations: Abdomen is soft.     Tenderness: There is no abdominal tenderness.   Musculoskeletal:        General: No swelling. Normal range of motion.     Cervical back: Normal range of motion and neck supple.     Right lower leg: No edema.     Left lower leg: No edema.   Skin:    General: Skin is warm and dry.     Coloration: Skin is not cyanotic or pale.   Neurological:     General: No  focal deficit present.     Mental Status: He is alert and oriented to person, place, and time.   Psychiatric:        Mood and Affect: Mood normal.        Behavior: Behavior normal.     (all labs ordered are listed, but only abnormal results are displayed) Labs Reviewed  RESP PANEL BY RT-PCR (RSV, FLU A&B, COVID)  RVPGX2 - Abnormal; Notable for the following components:      Result Value   SARS Coronavirus 2 by RT PCR POSITIVE (*)    All  other components within normal limits  BASIC METABOLIC PANEL WITH GFR - Abnormal; Notable for the following components:   Sodium 134 (*)    CO2 21 (*)    Creatinine, Ser 1.29 (*)    All other components within normal limits  CBC - Abnormal; Notable for the following components:   RDW 15.9 (*)    All other components within normal limits  BLOOD GAS, VENOUS - Abnormal; Notable for the following components:   pO2, Ven <31 (*)    All other components within normal limits  BRAIN NATRIURETIC PEPTIDE  MAGNESIUM    EKG: EKG Interpretation Date/Time:  Wednesday July 29 2023 11:50:24 EDT Ventricular Rate:  93 PR Interval:  151 QRS Duration:  88 QT Interval:  332 QTC Calculation: 418 R Axis:   -77  Text Interpretation: Sinus rhythm Left anterior fascicular block RSR' in V1 or V2, right VCD or RVH Nonspecific T abnrm, anterolateral leads Confirmed by Melvenia Motto [phone]) on 07/29/2023 5:59:47 PM  Radiology: DG Chest 2 View Result Date: 07/29/2023 CLINICAL DATA:  shortness of breath EXAM: CHEST - 2 VIEW COMPARISON:  July 08, 2023 FINDINGS: Streaky right basilar atelectasis. 9 mm nodular opacity in the right lung apex, unchanged. 1.4 cm ovoid nodular opacity in the left upper lung zone. These are unchanged. No focal airspace consolidation, pleural effusion, or pneumothorax. Surgical clips in the left hilum. No cardiomegaly. No acute fracture or destructive lesion. Cervical fusion hardware again noted. Osteopenia. Multilevel degenerative disc disease of the  spine. IMPRESSION: 1. No acute cardiopulmonary abnormality. 2. Similar appearance of the nodular opacities in the right upper and left upper lung zones, as described above. Electronically Signed   By: Rogelia Myers M.D.   On: 07/29/2023 12:29     Procedures   Medications Ordered in the ED  lactated ringers  bolus 500 mL (has no administration in time range)  iohexol  (OMNIPAQUE ) 350 MG/ML injection 80 mL (has no administration in time range)  methylPREDNISolone  sodium succinate (SOLU-MEDROL ) 125 mg/2 mL injection 125 mg (125 mg Intravenous Given 07/29/23 1904)  ipratropium-albuterol  (DUONEB) 0.5-2.5 (3) MG/3ML nebulizer solution 3 mL (3 mLs Nebulization Given 07/29/23 1900)  acetaminophen  (TYLENOL ) tablet 650 mg (650 mg Oral Given 07/29/23 1944)  ketorolac  (TORADOL ) 15 MG/ML injection 15 mg (15 mg Intravenous Given 07/29/23 2240)                                    Medical Decision Making Amount and/or Complexity of Data Reviewed Labs: ordered. Radiology: ordered.  Risk OTC drugs. Prescription drug management.   This patient presents to the ED for concern of shortness of breath and flulike symptoms, this involves an extensive number of treatment options, and is a complaint that carries with it a high risk of complications and morbidity.  The differential diagnosis includes URI, COPD exacerbation, CHF, pneumonia   Co morbidities / Chronic conditions that complicate the patient evaluation  COPD, lung cancer, chronic pain, HLD, anxiety, depression   Additional history obtained:  Additional history obtained from EMR External records from outside source obtained and reviewed including N/A   Lab Tests:  I Ordered, and personally interpreted labs.  The pertinent results include: Creatinine is slightly increased at baseline, electrolytes are normal.  No leukocytosis is present.  Patient did test positive for COVID-19.   Imaging Studies ordered:  I ordered imaging studies including  chest x-ray, CTA chest I independently visualized and  interpreted imaging which showed no acute findings on x-ray, CT imaging pending at time of signout. I agree with the radiologist interpretation   Cardiac Monitoring: / EKG:  The patient was maintained on a cardiac monitor.  I personally viewed and interpreted the cardiac monitored which showed an underlying rhythm of: Sinus rhythm   Problem List / ED Course / Critical interventions / Medication management  Patient presenting for flulike symptoms and shortness of breath over the past 3 days.  On arrival in the ED, SpO2 was in the 80s on his baseline 2 L.  He was placed on 3 L of supplemental oxygen .  On lung auscultation, he does have diminished breath sounds.  He does have a history of COPD.  Treatment with DuoNeb and Solu-Medrol  was ordered.  Workup was initiated.  Patient was found to be positive for COVID-19.  This was explained recent flulike symptoms, as well as his COPD exacerbation.  On reassessment, patient is eating and resting comfortably.  He states that his breathing has mildly improved following DuoNeb.  His lab work was notable for mild increase in creatinine from baseline.  IV fluids were ordered.  Toradol  was ordered for ongoing analgesia.  CTA was pending at time of signout.  Care of patient was signed out to oncoming ED provider. I ordered medication including Tylenol  and Toradol  for myalgias, Solu-Medrol  and DuoNeb for COPD, IV fluids for hydration Reevaluation of the patient after these medicines showed that the patient improved I have reviewed the patients home medicines and have made adjustments as needed   Social Determinants of Health:  Lives independently     Final diagnoses:  COVID-19  COPD exacerbation [facility])    ED Discharge Orders     None          Melvenia Motto, MD 07/29/23 2259    Melvenia Motto, MD 07/29/23 2302

## 2023-07-29 NOTE — ED Triage Notes (Signed)
 Pt arrived reporting shortness that started a few days ago. On oxygen  at home 2L. Patient endorses a new productive cough. Denies fever, reporting chills. States he has pain all over.
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
## 2023-07-30 ENCOUNTER — Telehealth: Payer: Self-pay | Admitting: Physician Assistant

## 2023-07-30 ENCOUNTER — Encounter (HOSPITAL_COMMUNITY): Payer: Self-pay | Admitting: Internal Medicine

## 2023-07-30 DIAGNOSIS — Z1152 Encounter for screening for COVID-19: Secondary | ICD-10-CM | POA: Diagnosis not present

## 2023-07-30 DIAGNOSIS — Z9981 Dependence on supplemental oxygen: Secondary | ICD-10-CM | POA: Diagnosis not present

## 2023-07-30 DIAGNOSIS — J441 Chronic obstructive pulmonary disease with (acute) exacerbation: Secondary | ICD-10-CM

## 2023-07-30 DIAGNOSIS — F32A Depression, unspecified: Secondary | ICD-10-CM | POA: Diagnosis present

## 2023-07-30 DIAGNOSIS — J9621 Acute and chronic respiratory failure with hypoxia: Secondary | ICD-10-CM | POA: Diagnosis present

## 2023-07-30 DIAGNOSIS — Z79899 Other long term (current) drug therapy: Secondary | ICD-10-CM | POA: Diagnosis not present

## 2023-07-30 DIAGNOSIS — E875 Hyperkalemia: Secondary | ICD-10-CM | POA: Diagnosis not present

## 2023-07-30 DIAGNOSIS — R0602 Shortness of breath: Secondary | ICD-10-CM | POA: Diagnosis present

## 2023-07-30 DIAGNOSIS — I10 Essential (primary) hypertension: Secondary | ICD-10-CM | POA: Diagnosis present

## 2023-07-30 DIAGNOSIS — Z85528 Personal history of other malignant neoplasm of kidney: Secondary | ICD-10-CM | POA: Diagnosis not present

## 2023-07-30 DIAGNOSIS — I2510 Atherosclerotic heart disease of native coronary artery without angina pectoris: Secondary | ICD-10-CM | POA: Diagnosis present

## 2023-07-30 DIAGNOSIS — Z7952 Long term (current) use of systemic steroids: Secondary | ICD-10-CM | POA: Diagnosis not present

## 2023-07-30 DIAGNOSIS — U071 COVID-19: Secondary | ICD-10-CM | POA: Diagnosis present

## 2023-07-30 DIAGNOSIS — K469 Unspecified abdominal hernia without obstruction or gangrene: Secondary | ICD-10-CM | POA: Diagnosis present

## 2023-07-30 DIAGNOSIS — J439 Emphysema, unspecified: Secondary | ICD-10-CM | POA: Diagnosis present

## 2023-07-30 DIAGNOSIS — Z7951 Long term (current) use of inhaled steroids: Secondary | ICD-10-CM | POA: Diagnosis not present

## 2023-07-30 DIAGNOSIS — Z66 Do not resuscitate: Secondary | ICD-10-CM | POA: Diagnosis present

## 2023-07-30 DIAGNOSIS — E785 Hyperlipidemia, unspecified: Secondary | ICD-10-CM | POA: Diagnosis present

## 2023-07-30 DIAGNOSIS — F419 Anxiety disorder, unspecified: Secondary | ICD-10-CM | POA: Diagnosis present

## 2023-07-30 DIAGNOSIS — I252 Old myocardial infarction: Secondary | ICD-10-CM | POA: Diagnosis not present

## 2023-07-30 DIAGNOSIS — Z905 Acquired absence of kidney: Secondary | ICD-10-CM | POA: Diagnosis not present

## 2023-07-30 DIAGNOSIS — Z87891 Personal history of nicotine dependence: Secondary | ICD-10-CM | POA: Diagnosis not present

## 2023-07-30 DIAGNOSIS — E559 Vitamin D deficiency, unspecified: Secondary | ICD-10-CM | POA: Diagnosis present

## 2023-07-30 DIAGNOSIS — Z85118 Personal history of other malignant neoplasm of bronchus and lung: Secondary | ICD-10-CM | POA: Diagnosis not present

## 2023-07-30 DIAGNOSIS — I700 Atherosclerosis of aorta: Secondary | ICD-10-CM | POA: Diagnosis present

## 2023-07-30 HISTORY — DX: Acute and chronic respiratory failure with hypoxia: J96.21

## 2023-07-30 HISTORY — DX: Chronic obstructive pulmonary disease with (acute) exacerbation: J44.1

## 2023-07-30 LAB — CBC WITH DIFFERENTIAL/PLATELET
Abs Immature Granulocytes: 0.04 10*3/uL (ref 0.00–0.07)
Basophils Absolute: 0 10*3/uL (ref 0.0–0.1)
Basophils Relative: 0 %
Eosinophils Absolute: 0 10*3/uL (ref 0.0–0.5)
Eosinophils Relative: 0 %
HCT: 42 % (ref 39.0–52.0)
Hemoglobin: 13.2 g/dL (ref 13.0–17.0)
Immature Granulocytes: 1 %
Lymphocytes Relative: 13 %
Lymphs Abs: 0.4 10*3/uL — ABNORMAL LOW (ref 0.7–4.0)
MCH: 30 pg (ref 26.0–34.0)
MCHC: 31.4 g/dL (ref 30.0–36.0)
MCV: 95.5 fL (ref 80.0–100.0)
Monocytes Absolute: 0.1 10*3/uL (ref 0.1–1.0)
Monocytes Relative: 5 %
Neutro Abs: 2.5 10*3/uL (ref 1.7–7.7)
Neutrophils Relative %: 81 %
Platelets: 265 10*3/uL (ref 150–400)
RBC: 4.4 MIL/uL (ref 4.22–5.81)
RDW: 15.9 % — ABNORMAL HIGH (ref 11.5–15.5)
WBC: 3.1 10*3/uL — ABNORMAL LOW (ref 4.0–10.5)
nRBC: 0 % (ref 0.0–0.2)

## 2023-07-30 LAB — PROCALCITONIN: Procalcitonin: 0.16 ng/mL

## 2023-07-30 MED ORDER — ENSURE PLUS HIGH PROTEIN PO LIQD: 237.0000 mL | Freq: Two times a day (BID) | ORAL | Status: DC

## 2023-07-30 MED ORDER — MENTHOL 3 MG MT LOZG
1.0000 | LOZENGE | OROMUCOSAL | Status: DC | PRN
  Administered 2023-07-30: 3 mg via ORAL
  Filled 2023-07-30: qty 9

## 2023-07-30 MED ORDER — METHYLPREDNISOLONE SODIUM SUCC 125 MG IJ SOLR
80.0000 mg | Freq: Two times a day (BID) | INTRAMUSCULAR | Status: DC
  Administered 2023-07-30: 80 mg via INTRAVENOUS
  Filled 2023-07-30: qty 2

## 2023-07-30 MED ORDER — IPRATROPIUM-ALBUTEROL 0.5-2.5 (3) MG/3ML IN SOLN
3.0000 mL | Freq: Four times a day (QID) | RESPIRATORY_TRACT | Status: DC
  Administered 2023-07-30 (×3): 3 mL via RESPIRATORY_TRACT
  Filled 2023-07-30 (×3): qty 3

## 2023-07-30 MED ORDER — VITAMIN D3 25 MCG (1000 UNIT) PO TABS
5000.0000 [IU] | ORAL_TABLET | Freq: Every day | ORAL | Status: DC
  Administered 2023-07-31 – 2023-08-01 (×2): 5000 [IU] via ORAL
  Filled 2023-07-30 (×2): qty 5

## 2023-07-30 MED ORDER — ALBUTEROL SULFATE (2.5 MG/3ML) 0.083% IN NEBU: 2.5000 mg | INHALATION_SOLUTION | RESPIRATORY_TRACT | Status: DC | PRN

## 2023-07-30 MED ORDER — ROSUVASTATIN CALCIUM 20 MG PO TABS
20.0000 mg | ORAL_TABLET | Freq: Every day | ORAL | Status: DC
  Administered 2023-07-30 – 2023-08-01 (×3): 20 mg via ORAL
  Filled 2023-07-30 (×3): qty 1

## 2023-07-30 MED ORDER — ACETAMINOPHEN 325 MG PO TABS
650.0000 mg | ORAL_TABLET | Freq: Four times a day (QID) | ORAL | Status: DC | PRN
  Administered 2023-07-30 – 2023-08-01 (×4): 650 mg via ORAL
  Filled 2023-07-30 (×4): qty 2

## 2023-07-30 MED ORDER — DEXAMETHASONE SODIUM PHOSPHATE 10 MG/ML IJ SOLN
6.0000 mg | INTRAMUSCULAR | Status: DC
  Administered 2023-07-31 – 2023-08-01 (×2): 6 mg via INTRAVENOUS
  Filled 2023-07-30 (×2): qty 1

## 2023-07-30 MED ORDER — ONDANSETRON HCL 4 MG/2ML IJ SOLN
4.0000 mg | Freq: Four times a day (QID) | INTRAMUSCULAR | Status: DC | PRN
  Administered 2023-07-31: 4 mg via INTRAVENOUS
  Filled 2023-07-30: qty 2

## 2023-07-30 MED ORDER — PANTOPRAZOLE SODIUM 40 MG PO TBEC
40.0000 mg | DELAYED_RELEASE_TABLET | Freq: Every day | ORAL | Status: DC
  Administered 2023-07-31 – 2023-08-01 (×2): 40 mg via ORAL
  Filled 2023-07-30 (×2): qty 1

## 2023-07-30 MED ORDER — ACETAMINOPHEN 650 MG RE SUPP: 650.0000 mg | Freq: Four times a day (QID) | RECTAL | Status: DC | PRN

## 2023-07-30 MED ORDER — LOSARTAN POTASSIUM 25 MG PO TABS
12.5000 mg | ORAL_TABLET | Freq: Every day | ORAL | Status: DC
  Administered 2023-07-30 – 2023-08-01 (×3): 12.5 mg via ORAL
  Filled 2023-07-30 (×3): qty 1

## 2023-07-30 MED ORDER — MAGNESIUM SULFATE 2 GM/50ML IV SOLN
2.0000 g | Freq: Once | INTRAVENOUS | Status: AC
  Administered 2023-07-30: 2 g via INTRAVENOUS
  Filled 2023-07-30: qty 50

## 2023-07-30 MED ORDER — NITROGLYCERIN 0.4 MG SL SUBL: 0.4000 mg | SUBLINGUAL_TABLET | SUBLINGUAL | Status: DC | PRN

## 2023-07-30 MED ORDER — IPRATROPIUM-ALBUTEROL 0.5-2.5 (3) MG/3ML IN SOLN: 3.0000 mL | Freq: Four times a day (QID) | RESPIRATORY_TRACT | Status: DC

## 2023-07-30 MED ORDER — MELATONIN 3 MG PO TABS
3.0000 mg | ORAL_TABLET | Freq: Every evening | ORAL | Status: DC | PRN
  Administered 2023-07-30 – 2023-07-31 (×2): 3 mg via ORAL
  Filled 2023-07-30 (×2): qty 1

## 2023-07-30 MED ORDER — GUAIFENESIN-DM 100-10 MG/5ML PO SYRP
10.0000 mL | ORAL_SOLUTION | ORAL | Status: DC | PRN
  Administered 2023-07-30: 10 mL via ORAL
  Filled 2023-07-30: qty 10

## 2023-07-30 NOTE — ED Notes (Signed)
 Patient resting at this time. No complaints were made to Clinical research associate.  Sent off light green recollect for lab

## 2023-07-30 NOTE — Progress Notes (Signed)
  Carryover admission to the Day Admitter.  I discussed this case with the EDP, Dr. Griselda.  Per these discussions:   This is a 68 year old male with history of COPD, chronic hypoxic respiratory failure on 2 L continuous nasal cannula at baseline, who is being admitted with acute on chronic hypoxic respiratory failure in the setting of acute COPD exacerbation as well as COVID-19 infection presenting with progressive shortness of breath over the last few days.   Initial oxygen  saturation was noted to be in the low 80s on his baseline 2 L nasal cannula, improving into the mid 90s on 4 L nasal cannula.  CT a chest with PE protocol showed no evidence of acute cardiopulmonary process, including no evidence of infiltrate or edema.  COVID 19 PCR was positive.  In the ED this evening he received duo nebulizer treatment as well as dose of solumedrol.  I have placed an order for inpatient admission to the PCU for further evaluation management of the above.  I have placed some additional preliminary admit orders via the adult multi-morbid admission order set. I have also ordered additional IV Solu-Medrol , as well as scheduled duo nebulizer treatments and prn albuterol  nebulizers.  Of ordered morning labs in the form of CMP, CBC, magnesium and phosphorus levels, and added on a procalcitonin level.  For his COVID-19 finding, have ordered airborne/contact precautions as well as flutter valve and incentive spirometry.  Of note, I have ordered the patient's code status as DNR/DNI based upon code status documentation from most recent prior hospitalization.    Eva Pore, DO Hospitalist
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
## 2023-07-30 NOTE — Telephone Encounter (Signed)
 Rescheduled appointments with the patient.

## 2023-07-31 ENCOUNTER — Telehealth

## 2023-07-31 DIAGNOSIS — J441 Chronic obstructive pulmonary disease with (acute) exacerbation: Secondary | ICD-10-CM | POA: Diagnosis not present

## 2023-07-31 LAB — CBC
HCT: 42.5 % (ref 39.0–52.0)
Hemoglobin: 13.1 g/dL (ref 13.0–17.0)
MCH: 29.8 pg (ref 26.0–34.0)
MCHC: 30.8 g/dL (ref 30.0–36.0)
MCV: 96.6 fL (ref 80.0–100.0)
Platelets: 353 10*3/uL (ref 150–400)
RBC: 4.4 MIL/uL (ref 4.22–5.81)
RDW: 15.4 % (ref 11.5–15.5)
WBC: 8.7 10*3/uL (ref 4.0–10.5)
nRBC: 0 % (ref 0.0–0.2)

## 2023-07-31 LAB — COMPREHENSIVE METABOLIC PANEL WITH GFR
ALT: 10 U/L (ref 0–44)
AST: 15 U/L (ref 15–41)
Albumin: 3.4 g/dL — ABNORMAL LOW (ref 3.5–5.0)
Alkaline Phosphatase: 92 U/L (ref 38–126)
Anion gap: 10 (ref 5–15)
BUN: 19 mg/dL (ref 8–23)
CO2: 22 mmol/L (ref 22–32)
Calcium: 8.9 mg/dL (ref 8.9–10.3)
Chloride: 103 mmol/L (ref 98–111)
Creatinine, Ser: 0.97 mg/dL (ref 0.61–1.24)
GFR, Estimated: >60 mL/min (ref >60)
Glucose, Bld: 114 mg/dL — ABNORMAL HIGH (ref 70–99)
Potassium: 6.1 mmol/L — ABNORMAL HIGH (ref 3.5–5.1)
Sodium: 135 mmol/L (ref 135–145)
Total Bilirubin: 0.7 mg/dL (ref 0.0–1.2)
Total Protein: 6.8 g/dL (ref 6.5–8.1)

## 2023-07-31 LAB — BASIC METABOLIC PANEL WITH GFR
Anion gap: 11 (ref 5–15)
BUN: 17 mg/dL (ref 8–23)
CO2: 21 mmol/L — ABNORMAL LOW (ref 22–32)
Calcium: 8.8 mg/dL — ABNORMAL LOW (ref 8.9–10.3)
Chloride: 103 mmol/L (ref 98–111)
Creatinine, Ser: 1.15 mg/dL (ref 0.61–1.24)
GFR, Estimated: >60 mL/min (ref >60)
Glucose, Bld: 238 mg/dL — ABNORMAL HIGH (ref 70–99)
Potassium: 4.8 mmol/L (ref 3.5–5.1)
Sodium: 135 mmol/L (ref 135–145)

## 2023-07-31 MED ORDER — OXYCODONE-ACETAMINOPHEN 5-325 MG PO TABS
1.0000 | ORAL_TABLET | Freq: Four times a day (QID) | ORAL | Status: DC | PRN
  Administered 2023-07-31: 1 via ORAL
  Filled 2023-07-31: qty 1

## 2023-07-31 MED ORDER — SODIUM CHLORIDE 0.9 % IV SOLN
12.5000 mg | Freq: Once | INTRAVENOUS | Status: AC
  Administered 2023-07-31: 12.5 mg via INTRAVENOUS
  Filled 2023-07-31: qty 0.5

## 2023-07-31 MED ORDER — GUAIFENESIN ER 600 MG PO TB12
600.0000 mg | ORAL_TABLET | Freq: Two times a day (BID) | ORAL | Status: DC
  Administered 2023-07-31 – 2023-08-01 (×3): 600 mg via ORAL
  Filled 2023-07-31 (×3): qty 1

## 2023-07-31 MED ORDER — IPRATROPIUM-ALBUTEROL 0.5-2.5 (3) MG/3ML IN SOLN
3.0000 mL | Freq: Three times a day (TID) | RESPIRATORY_TRACT | Status: DC
  Administered 2023-07-31 – 2023-08-01 (×4): 3 mL via RESPIRATORY_TRACT
  Filled 2023-07-31 (×4): qty 3

## 2023-07-31 NOTE — Plan of Care (Signed)

## 2023-07-31 NOTE — TOC Initial Note (Signed)
 Transition of Care [facility]) - Initial/Assessment Note   Patient Details  Name: [name] MRN: [id number] Date of Birth: Nov 03, 1955  [facility]) CM/SW Contact:    Duwaine GORMAN Aran, LCSW Phone Number: 07/31/2023, 9:57 AM  Clinical Narrative: Patient is from home. Patient receives PCS through [facility]. Patient is on 2L/min home oxygen  through [employer], but is currently requiring 3L/min. TOC to follow for possible discharge needs.  Expected Discharge Plan: Home/Self Care Barriers to Discharge: Continued Medical Work up  Expected Discharge Plan and Services In-house Referral: Clinical Social Work Post Acute Care Choice: NA Living arrangements for the past 2 months: Apartment  Prior Living Arrangements/Services Living arrangements for the past 2 months: Apartment Lives with:: Self Patient language and need for interpreter reviewed:: Yes Do you feel safe going back to the place where you live?: Yes      Need for Family Participation in Patient Care: No (Comment) Care giver support system in place?: Yes (comment) Current home services: DME ([employer] provides home oxygen . Has PCS through [facility].) Criminal Activity/Legal Involvement Pertinent to Current Situation/Hospitalization: No - Comment as needed  Activities of Daily Living ADL Screening (condition at time of admission) Independently performs ADLs?: Yes (appropriate for developmental age) Is the patient deaf or have difficulty hearing?: No Does the patient have difficulty seeing, even when wearing glasses/contacts?: No Does the patient have difficulty concentrating, remembering, or making decisions?: No  Emotional Assessment Alcohol / Substance Use: Not Applicable Psych Involvement: No (comment)  Admission diagnosis:  Acute exacerbation of chronic obstructive pulmonary disease (COPD) (HCC) [J44.1] COPD exacerbation (HCC) [J44.1] COVID-19 [U07.1] Patient Active Problem List   Diagnosis  Date Noted   Acute exacerbation of chronic obstructive pulmonary disease (COPD) (HCC) 07/30/2023   Acute on chronic respiratory failure with hypoxia (HCC) 07/30/2023   COVID-19 virus infection 07/30/2023   Community acquired pneumonia 06/08/2023   Sepsis due to pneumonia (HCC) 06/08/2023   Chronic respiratory failure with hypoxia (HCC) 05/07/2023   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   Umbilical hernia without obstruction or gangrene 04/16/2023   Atopic dermatitis 02/17/2023   Seborrheic dermatitis 02/17/2023   Myalgia 12/24/2022   Tension type headache 11/05/2022   Rhinorrhea 11/05/2022   Bronchitis 07/09/2022   Urinary frequency 06/04/2022   Vitamin D  deficiency 02/25/2022   Irritable bowel syndrome with diarrhea 11/21/2021   Small intestinal bacterial overgrowth (SIBO) 11/21/2021   Hyperlipidemia 10/14/2021   Administration of long-term prophylactic antibiotics 08/28/2021   Anxiety and depression 05/01/2021   Cramping of hands 05/01/2021   Physical deconditioning 04/16/2021   Insomnia 03/28/2021   Previously noted chronic colitis of the right colon on 2020 colonoscopy 12/15/2020   Generalized abdominal pain 12/14/2020   Chronic diarrhea 12/14/2020   Globus sensation 12/14/2020   Hypertension 12/02/2020   Aortic atherosclerosis (HCC) 11/22/2020   Dysphagia 05/22/2020   Polypharmacy 04/27/2020   Chronic pain syndrome 12/13/2019   Renal mass 06/01/2019   Laryngopharyngeal reflux (LPR) 02/13/2017   GERD (gastroesophageal reflux disease) 02/09/2017   Allergic rhinitis 08/12/2016   Chest wall pain 04/16/2015   Spondylosis, cervical, 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   Lung cancer (HCC) 07/03/2011   CAD (coronary artery disease) 06/05/2011   Chronic headaches 04/08/2011   Abnormal CT of the chest 04/08/2011   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   PCP:  Nooruddin, Saad, MD Pharmacy:   [id number] - [place] - [address] [place] ([place]) [place] [address]  Phone: [phone] Fax: [phone]  Social Drivers of Health (SDOH) Social History: SDOH Screenings   Food Insecurity: No Food Insecurity (07/30/2023)  Housing: Low Risk  (07/30/2023)  Transportation Needs: No Transportation Needs (07/30/2023)  Utilities: Not At Risk (07/30/2023)  Alcohol Screen: Low Risk  (11/05/2022)  Depression (PHQ2-9): Low Risk  (02/17/2023)  Financial Resource Strain: Low Risk  (11/05/2022)  Physical Activity: Insufficiently Active (11/05/2022)  Social Connections: Moderately Integrated (07/30/2023)  Recent Concern: Social Connections - Moderately Isolated (06/08/2023)  Stress: Stress Concern Present (11/05/2022)  Tobacco Use: Medium Risk (07/30/2023)   SDOH Interventions:    Readmission Risk Interventions     No data to display

## 2023-07-31 NOTE — Plan of Care (Signed)
   Problem: Education: Goal: Knowledge of risk factors and measures for prevention of condition will improve Outcome: Progressing   Problem: Coping: Goal: Psychosocial and spiritual needs will be supported Outcome: Progressing   Problem: Respiratory: Goal: Will maintain a patent airway Outcome: Progressing Goal: Complications related to the disease process, condition or treatment will be avoided or minimized Outcome: Progressing

## 2023-07-31 NOTE — Progress Notes (Signed)
 PROGRESS NOTE  [name]  DOB: Dec 30, 1955  PCP: Nooruddin, Saad, MD FMW:[id number]  DOA: 07/29/2023  LOS: 1 day  Hospital Day: 3  Brief narrative: [name] is a 68 y.o. male with PMH significant for HTN, HLD, CAD/MI, COPD, lung cancer on 2 LPM O2, GERD, anxiety/depression, h/o polysubstance abuse 6/25, patient presented to the ED with complaint of shortness of breath, productive cough, body aches, fatigue. Patient lives at home alone.  Stopped smoking several years ago.  Follows up with pulmonologist Dr. Shelah.  Uses 2 L oxygen .  On 10 mg prednisone  every other day.  In the ED, patient was afebrile, heart rate 40s and 50s, blood pressure in the low normal range, O2 sat was low at 82% and started on supplemental oxygen  Labs with CBC unremarkable, procalcitonin level 0.16, BUN/creatinine 14/1.9 COVID PCR positive CT angio chest did not show any evidence of PE showed improving nodular airspace disease in the right lower lobe Patient was given IV Solu-Medrol  Admitted to [facility]   Subjective: Patient was seen and examined this morning. Sitting up at the edge of the bed.  Not in distress.  Feels better than at presentation.  Coughs on deep breathing. Chart reviewed Afebrile, heart rate in the 50s, blood pressure normal, on 3 L this morning Labs from this morning with potassium elevated to 6.1, CBC unremarkable  Assessment and plan: Acute on chronic respiratory failure with hypoxia Acute exacerbation of COPD COVID-19 virus infection Chest imaging was improving previous infiltrates.  No new infiltrate  On admission, started on dexamethasone  6 mg IV daily Continue supportive care with bronchodilators, spirometry, antitussives  Hyperkalemia Potassium level was elevated at 6.1 this morning.  Seems no intervention was done at the time.  This morning, I repeated another BMP which showed potassium in normal range of 4.8. Recent Labs  Lab 07/29/23 1135 07/29/23 1857 07/31/23 0342  07/31/23 0944  K 4.0  --  6.1* 4.8  MG  --  1.8  --   --    CAD (coronary artery disease) Not on beta-blocker (bradycardia). Continue statin, losartan . As needed NTG.   Hyperlipidemia Aortic atherosclerosis (HCC) Continue rosuvastatin .   Hypertension Continue losartan  12.5 mg p.o. daily.   Vitamin D  deficiency Vitamin D  supplementation.    Mobility: Encourage ambulation.  Goals of care   Code Status: Limited: Do not attempt resuscitation (DNR) -DNR-LIMITED -Do Not Intubate/DNI      DVT prophylaxis: None SCDs Start: 07/30/23 0122   Antimicrobials: None Fluid: None Consultants: None Family Communication: None at bedside  Status: Inpatient Level of care:  Telemetry   Patient is from: Home Needs to continue in-hospital care: Very tired. Anticipated d/c to: Pending PT eval      Diet:  Diet Order             Diet regular Room service appropriate? Yes; Fluid consistency: Thin  Diet effective now                   Scheduled Meds:  cholecalciferol  5,000 Units Oral Q0600   dexamethasone  (DECADRON ) injection  6 mg Intravenous Q24H   feeding supplement  237 mL Oral BID BM   guaiFENesin   600 mg Oral BID   ipratropium-albuterol   3 mL Nebulization TID   losartan   12.5 mg Oral Daily   pantoprazole   40 mg Oral Daily   rosuvastatin   20 mg Oral Daily    PRN meds: acetaminophen  **OR** acetaminophen , albuterol , melatonin, menthol -cetylpyridinium, nitroGLYCERIN , ondansetron  (ZOFRAN ) IV  Infusions:    Antimicrobials: Anti-infectives (From admission, onward)    None       Objective: Vitals:   07/31/23 0948 07/31/23 1244  BP:  115/67  Pulse:  71  Resp:  20  Temp:  97.7 F (36.5 C)  SpO2: 95% 95%    Intake/Output Summary (Last 24 hours) at 07/31/2023 1339 Last data filed at 07/30/2023 1722 Gross per 24 hour  Intake 290 ml  Output --  Net 290 ml   Filed Weights   07/29/23 1132 07/31/23 0500  Weight: 69.9 kg 70 kg   Weight change: 0.1  kg Body mass index is 22.79 kg/m.   Physical Exam: General exam: Pleasant, elderly African-American male. Skin: No rashes, lesions or ulcers. HEENT: Atraumatic, normocephalic, no obvious bleeding Lungs: Clear to auscultation bilaterally, coughs on deep breathing CVS: S1, S2, no murmur,   GI/Abd: Soft, nontender, nondistended, bowel sound present,   CNS: Alert, awake, oriented x 3 Psychiatry: Mood appropriate, Extremities: No pedal edema, no calf tenderness,   Data Review: I have personally reviewed the laboratory data and studies available.  F/u labs ordered Unresulted Labs (From admission, onward)     Start     Ordered   07/30/23 1900  C Difficile Quick Screen w PCR reflex  (C Difficile quick screen w PCR reflex panel )  Once, for 24 hours,   TIMED       References:    CDiff Information Tool   07/30/23 1859            Signed, Chapman Rota, MD [facility] 07/31/2023

## 2023-08-01 DIAGNOSIS — J441 Chronic obstructive pulmonary disease with (acute) exacerbation: Secondary | ICD-10-CM | POA: Diagnosis not present

## 2023-08-01 MED ORDER — GUAIFENESIN ER 600 MG PO TB12: 600.0000 mg | ORAL_TABLET | Freq: Two times a day (BID) | ORAL | 0 refills | Status: AC

## 2023-08-01 MED ORDER — PREDNISONE 10 MG PO TABS: 10.0000 mg | ORAL_TABLET | ORAL | Status: DC

## 2023-08-01 MED ORDER — DEXAMETHASONE 6 MG PO TABS: 6.0000 mg | ORAL_TABLET | Freq: Every day | ORAL | 0 refills | Status: AC

## 2023-08-01 NOTE — Progress Notes (Signed)
 PT Cancellation Note  Patient Details Name: [name] MRN: [id number] DOB: [birth year]   Cancelled Treatment:    Reason Eval/Treat Not Completed: Other (comment). Per chart review, pt with discharge summary in. Spoke with RN and RN reports pt ind in the room, no PT needs. Will sign off at this time.    Metta Ave PT, DPT 08/01/23, 10:33 AM

## 2023-08-01 NOTE — Plan of Care (Signed)
   Problem: Education: Goal: Knowledge of risk factors and measures for prevention of condition will improve Outcome: Progressing   Problem: Coping: Goal: Psychosocial and spiritual needs will be supported Outcome: Progressing   Problem: Respiratory: Goal: Will maintain a patent airway Outcome: Progressing

## 2023-08-01 NOTE — Progress Notes (Signed)
 AVS given to patient and explained at the bedside. Medications and follow up appointments have been explained with pt verbalizing understanding.

## 2023-08-01 NOTE — Discharge Summary (Signed)
 Physician Discharge Summary  [name] FMW:[id number] DOB: 03-11-1955 DOA: 07/29/2023  PCP: Nooruddin, Saad, MD  Admit date: 07/29/2023 Discharge date: 08/01/2023  Admitted From: Home Discharge disposition: Home  Recommendations at discharge:  Complete the course of steroids with 5 more days of dexamethasone .  After that can resume prednisone  as before. Continue bronchodilators as before Continue to follow-up with pulmonology Dr. Shelah.   Brief narrative: [name] is a 68 y.o. male with PMH significant for HTN, HLD, CAD/MI, COPD, lung cancer on 2 LPM O2, GERD, anxiety/depression, h/o polysubstance abuse 6/25, patient presented to the ED with complaint of shortness of breath, productive cough, body aches, fatigue. Patient lives at home alone.  Stopped smoking several years ago.  Follows up with pulmonologist Dr. Shelah.  Uses 2 L oxygen .  On 10 mg prednisone  every other day.  In the ED, patient was afebrile, heart rate 40s and 50s, blood pressure in the low normal range, O2 sat was low at 82% and started on supplemental oxygen  Labs with CBC unremarkable, procalcitonin level 0.16, BUN/creatinine 14/1.9 COVID PCR positive CT angio chest did not show any evidence of PE showed improving nodular airspace disease in the right lower lobe Patient was given IV Solu-Medrol  Admitted to [facility]   Subjective: Patient was seen and examined this morning. Sitting up in bed.  Not in distress.  Feels anxious.  Cough and shortness of breath improving.  On 2 L oxygen  which is his chronic requirement.  Clear to auscultation bilaterally.  Hospital course: Acute on chronic respiratory failure with hypoxia Acute exacerbation of COPD COVID-19 virus infection Chest imaging was improving previous infiltrates.  No new infiltrate  On admission, started on dexamethasone  6 mg IV daily Clinically improving.  Will plan to discharge him on 5 more days of oral dexamethasone .  Patient states that he  follows up with Dr. Shelah and was on prednisone  10 mg every 3 days.  Once the 5-day course of dexamethasone  is complete, patient can resume prednisone  as before. Also continue bronchodilators as before. Advised a total of 5 days of isolation for COVID  Hyperkalemia Likely any other.  Potassium level was elevated at 6.1 yesterday morning.  Repeat labs without any intervention showed normal potassium level.   Recent Labs  Lab 07/29/23 1135 07/29/23 1857 07/31/23 0342 07/31/23 0944  K 4.0  --  6.1* 4.8  MG  --  1.8  --   --    CAD (coronary artery disease) Not on beta-blocker (bradycardia). Continue statin, losartan . As needed NTG.   Hyperlipidemia Aortic atherosclerosis Continue rosuvastatin .   Hypertension Continue losartan  12.5 mg p.o. daily.   Vitamin D  deficiency Vitamin D  supplementation.  Abdominal hernia Waiting for outpatient general surgery evaluation.  Not incarcerated currently    Mobility: Encourage ambulation.  Independent.  Goals of care   Code Status: Limited: Do not attempt resuscitation (DNR) -DNR-LIMITED -Do Not Intubate/DNI    Diet:  Diet Order             Diet general           Diet regular Room service appropriate? Yes; Fluid consistency: Thin  Diet effective now                   Nutritional status:  Body mass index is 21.81 kg/m.       Wounds:  -    Discharge Exam:   Vitals:   07/31/23 2035 07/31/23 2049 08/01/23 0447 08/01/23 0735  BP:  97/67 ROLLEN)  102/58   Pulse:  (!) 56 (!) 44   Resp:  20 16   Temp:  97.9 F (36.6 C) (!) 97.5 F (36.4 C)   TempSrc:      SpO2: 91% 93% 97% 97%  Weight:   67 kg   Height:        Body mass index is 21.81 kg/m.  General exam: Pleasant, elderly African-American male. Skin: No rashes, lesions or ulcers. HEENT: Atraumatic, normocephalic, no obvious bleeding Lungs: Clear to auscultation bilaterally, improving cough on deep breathing today. CVS: S1, S2, no murmur,   GI/Abd: Soft,  nontender, nondistended, bowel sound present,   CNS: Alert, awake, oriented x 3 Psychiatry: Anxious Extremities: No pedal edema, no calf tenderness,   Follow ups:    Follow-up Information     Nooruddin, Saad, MD Follow up.   Specialty: Internal Medicine Contact information: [address] [place], [address] [place] [address] [phone]                 Discharge Instructions:   Discharge Instructions     Call MD for:  difficulty breathing, headache or visual disturbances   Complete by: As directed    Call MD for:  extreme fatigue   Complete by: As directed    Call MD for:  hives   Complete by: As directed    Call MD for:  persistant dizziness or light-headedness   Complete by: As directed    Call MD for:  persistant nausea and vomiting   Complete by: As directed    Call MD for:  severe uncontrolled pain   Complete by: As directed    Call MD for:  temperature >100.4   Complete by: As directed    Diet general   Complete by: As directed    Discharge instructions   Complete by: As directed    Recommendations at discharge:   Complete the course of steroids with 5 more days of dexamethasone .  After that can resume prednisone  as before.  Continue bronchodilators as before  Continue to follow-up with pulmonology Dr. Shelah.  General discharge instructions: Follow with Primary MD Nooruddin, Saad, MD in 7 days  Please request your PCP  to go over your hospital tests, procedures, radiology results at the follow up. Please get your medicines reviewed and adjusted.  Your PCP may decide to repeat certain labs or tests as needed. Do not drive, operate heavy machinery, perform activities at heights, swimming or participation in water  activities or provide baby sitting services if your were admitted for syncope or siezures until you have seen by Primary MD or a Neurologist and advised to do so again. [place]  Controlled Substance Reporting System database was reviewed.  Do not drive, operate heavy machinery, perform activities at heights, swim, participate in water  activities or provide baby-sitting services while on medications for pain, sleep and mood until your outpatient physician has reevaluated you and advised to do so again.  You are strongly recommended to comply with the dose, frequency and duration of prescribed medications. Activity: As tolerated with Full fall precautions use walker/cane & assistance as needed Avoid using any recreational substances like cigarette, tobacco, alcohol, or non-prescribed drug. If you experience worsening of your admission symptoms, develop shortness of breath, life threatening emergency, suicidal or homicidal thoughts you must seek medical attention immediately by calling 911 or calling your MD immediately  if symptoms less severe. You must read complete instructions/literature along with all the possible adverse reactions/side effects for all  the medicines you take and that have been prescribed to you. Take any new medicine only after you have completely understood and accepted all the possible adverse reactions/side effects.  Wear Seat belts while driving. You were cared for by a hospitalist during your hospital stay. If you have any questions about your discharge medications or the care you received while you were in the hospital after you are discharged, you can call the unit and ask to speak with the hospitalist or the covering physician. Once you are discharged, your primary care physician will handle any further medical issues. Please note that NO REFILLS for any discharge medications will be authorized once you are discharged, as it is imperative that you return to your primary care physician (or establish a relationship with a primary care physician if you do not have one).   Increase activity slowly   Complete by: As directed        Discharge Medications:   Allergies as of 08/01/2023       Reactions    Ipratropium-albuterol  Other (See Comments)   Makes the patient jittery   Isosorbide  Other (See Comments)   Worsened the feeling of chest pain        Medication List     TAKE these medications    albuterol  108 (90 Base) MCG/ACT inhaler Commonly known as: VENTOLIN  HFA INHALE 2 PUFFS BY MOUTH EVERY 6 HOURS AS NEEDED FOR WHEEZING FOR SHORTNESS OF BREATH   BC HEADACHE POWDER PO Take 1 packet by mouth daily as needed (for headaches).   cetirizine  10 MG tablet Commonly known as: ZyrTEC  Allergy Take 1 tablet (10 mg total) by mouth daily. What changed:  when to take this reasons to take this   dexamethasone  6 MG tablet Commonly known as: Decadron  Take 1 tablet (6 mg total) by mouth daily for 5 days.   escitalopram  10 MG tablet Commonly known as: Lexapro  Take 1 tablet (10 mg total) by mouth daily.   finasteride  5 MG tablet Commonly known as: PROSCAR  Take 5 mg by mouth daily.   fluticasone  50 MCG/ACT nasal spray Commonly known as: FLONASE  Use 2 spray(s) in each nostril once daily What changed:  how much to take how to take this when to take this reasons to take this additional instructions   guaiFENesin  600 MG 12 hr tablet Commonly known as: MUCINEX  Take 1 tablet (600 mg total) by mouth 2 (two) times daily for 7 days.   HYDROcodone -acetaminophen  10-325 MG tablet Commonly known as: NORCO Take 0.5-1 tablets by mouth 3 (three) times daily as needed (for pain).   hydrocortisone  cream 1 % Apply 1 Application topically 2 (two) times daily as needed for itching.   hydrOXYzine  25 MG tablet Commonly known as: ATARAX  Take 1 tablet (25 mg total) by mouth every 6 (six) hours as needed for anxiety.   ipratropium 0.03 % nasal spray Commonly known as: ATROVENT  Place 2 sprays into both nostrils every 12 (twelve) hours. What changed:  when to take this reasons to take this   ipratropium-albuterol  0.5-2.5 (3) MG/3ML Soln Commonly known as: DUONEB Take 3 mLs by nebulization  every 6 (six) hours as needed (for shortness of breath or wheezing).   isosorbide  mononitrate 60 MG 24 hr tablet Commonly known as: IMDUR  Take 1 tablet (60 mg total) by mouth daily.   ketoconazole  2 % shampoo Commonly known as: NIZORAL  Apply 1 Application topically every three (3) days as needed for irritation.   lidocaine  5 % Commonly known as:  Lidoderm  Place 1 patch unto chest wall. Remove & Discard patch within 12 hours.   losartan  25 MG tablet Commonly known as: COZAAR  Take 0.5 tablets (12.5 mg total) by mouth daily.   mesalamine  1.2 g EC tablet Commonly known as: LIALDA  Take 4 tablets (4.8 g total) by mouth daily with breakfast. What changed:  how much to take when to take this additional instructions   metoCLOPramide  5 MG tablet Commonly known as: REGLAN  Take 1 tablet (5 mg total) by mouth every 8 (eight) hours as needed for nausea.   nitroGLYCERIN  0.4 MG SL tablet Commonly known as: NITROSTAT  Place 1 tablet (0.4 mg total) under the tongue every 5 (five) minutes as needed for chest pain.   omeprazole  40 MG capsule Commonly known as: PRILOSEC Take 1 capsule (40 mg total) by mouth daily. What changed: when to take this   OXYGEN  Inhale 2 L/min into the lungs continuous.   predniSONE  10 MG tablet Commonly known as: DELTASONE  Take 1 tablet (10 mg total) by mouth See admin instructions. Take 10 mg by mouth every three days with food Start taking on: August 08, 2023 What changed:  when to take this additional instructions These instructions start on August 08, 2023. If you are unsure what to do until then, ask your doctor or other care provider.   Qulipta 30 MG Tabs Generic drug: Atogepant Take 30 mg by mouth daily.   Restasis MultiDose 0.05 % ophthalmic emulsion Generic drug: cycloSPORINE Place 1 drop into the left eye 2 (two) times daily.   rosuvastatin  20 MG tablet Commonly known as: CRESTOR  Take 1 tablet (20 mg total) by mouth daily. What changed: when to  take this   Trelegy Ellipta  200-62.5-25 MCG/ACT Aepb Generic drug: Fluticasone -Umeclidin-Vilant INHALE 1 PUFF INTO LUNGS ONCE DAILY         The results of significant diagnostics from this hospitalization (including imaging, microbiology, ancillary and laboratory) are listed below for reference.    Procedures and Diagnostic Studies:   CT Angio Chest PE W and/or Wo Contrast Result Date: 07/29/2023 CLINICAL DATA:  Pulmonary embolism (PE) suspected, high prob. Cough, shortness of breath EXAM: CT ANGIOGRAPHY CHEST WITH CONTRAST TECHNIQUE: Multidetector CT imaging of the chest was performed using the standard protocol during bolus administration of intravenous contrast. Multiplanar CT image reconstructions and MIPs were obtained to evaluate the vascular anatomy. RADIATION DOSE REDUCTION: This exam was performed according to the departmental dose-optimization program which includes automated exposure control, adjustment of the mA and/or kV according to patient size and/or use of iterative reconstruction technique. CONTRAST:  75mL OMNIPAQUE  IOHEXOL  350 MG/ML SOLN COMPARISON:  Chest x-ray today.  CT 07/08/2023 FINDINGS: Cardiovascular: No filling defects in the pulmonary arteries to suggest pulmonary emboli. Mild cardiomegaly. Prior CABG. Aorta normal caliber. Scattered aortic atherosclerosis. Mediastinum/Nodes: No mediastinal, hilar, or axillary adenopathy. Trachea and esophagus are unremarkable. Thyroid  unremarkable. Lungs/Pleura: Moderate to advanced centrilobular and paraseptal emphysema. Multiple bilateral upper lobe nodules are again noted, unchanged. Previously seen nodular airspace opacity in the superior segment of the right lower lobe has nearly completely resolved compatible with an infectious or inflammatory process. Airspace disease in the right lower lobe at the right lung base is similar to prior study. No effusions. Upper Abdomen: No acute findings Musculoskeletal: Chest wall soft tissues are  unremarkable. No acute bony abnormality. Review of the MIP images confirms the above findings. IMPRESSION: No evidence of pulmonary embolus. Bilateral upper lobe pulmonary nodules are stable since prior study. Improving nodular airspace disease in  the superior segment of the right lower lobe compatible with infectious or inflammatory process. Stable chronic airspace opacities at the right lung base. Aortic Atherosclerosis (ICD10-I70.0) and Emphysema (ICD10-J43.9). Electronically Signed   By: Franky Crease M.D.   On: 07/29/2023 23:18   DG Chest 2 View Result Date: 07/29/2023 CLINICAL DATA:  shortness of breath EXAM: CHEST - 2 VIEW COMPARISON:  July 08, 2023 FINDINGS: Streaky right basilar atelectasis. 9 mm nodular opacity in the right lung apex, unchanged. 1.4 cm ovoid nodular opacity in the left upper lung zone. These are unchanged. No focal airspace consolidation, pleural effusion, or pneumothorax. Surgical clips in the left hilum. No cardiomegaly. No acute fracture or destructive lesion. Cervical fusion hardware again noted. Osteopenia. Multilevel degenerative disc disease of the spine. IMPRESSION: 1. No acute cardiopulmonary abnormality. 2. Similar appearance of the nodular opacities in the right upper and left upper lung zones, as described above. Electronically Signed   By: Rogelia Myers M.D.   On: 07/29/2023 12:29     Labs:   Basic Metabolic Panel: Recent Labs  Lab 07/29/23 1135 07/29/23 1857 07/31/23 0342 07/31/23 0944  NA 134*  --  135 135  K 4.0  --  6.1* 4.8  CL 103  --  103 103  CO2 21*  --  22 21*  GLUCOSE 95  --  114* 238*  BUN 14  --  19 17  CREATININE 1.29*  --  0.97 1.15  CALCIUM  9.2  --  8.9 8.8*  MG  --  1.8  --   --    GFR Estimated Creatinine Clearance: 58.3 mL/min (by C-G formula based on SCr of 1.15 mg/dL). Liver Function Tests: Recent Labs  Lab 07/31/23 0342  AST 15  ALT 10  ALKPHOS 92  BILITOT 0.7  PROT 6.8  ALBUMIN  3.4*   No results for input(s):  LIPASE, AMYLASE in the last 168 hours. No results for input(s): AMMONIA in the last 168 hours. Coagulation profile No results for input(s): INR, PROTIME in the last 168 hours.  CBC: Recent Labs  Lab 07/29/23 1135 07/30/23 0428 07/31/23 0342  WBC 7.6 3.1* 8.7  NEUTROABS  --  2.5  --   HGB 13.5 13.2 13.1  HCT 43.0 42.0 42.5  MCV 94.5 95.5 96.6  PLT 338 265 353   Cardiac Enzymes: No results for input(s): CKTOTAL, CKMB, CKMBINDEX, TROPONINI in the last 168 hours. BNP: Invalid input(s): POCBNP CBG: No results for input(s): GLUCAP in the last 168 hours. D-Dimer No results for input(s): DDIMER in the last 72 hours. Hgb A1c No results for input(s): HGBA1C in the last 72 hours. Lipid Profile No results for input(s): CHOL, HDL, LDLCALC, TRIG, CHOLHDL, LDLDIRECT in the last 72 hours. Thyroid  function studies No results for input(s): TSH, T4TOTAL, T3FREE, THYROIDAB in the last 72 hours.  Invalid input(s): FREET3 Anemia work up No results for input(s): VITAMINB12, FOLATE, FERRITIN, TIBC, IRON, RETICCTPCT in the last 72 hours. Microbiology Recent Results (from the past 240 hours)  Resp panel by RT-PCR (RSV, Flu A&B, Covid) Anterior Nasal Swab     Status: Abnormal   Collection Time: 07/29/23  6:22 PM   Specimen: Anterior Nasal Swab  Result Value Ref Range Status   SARS Coronavirus 2 by RT PCR POSITIVE (A) NEGATIVE Final    Comment: (NOTE) SARS-CoV-2 target nucleic acids are DETECTED.  The SARS-CoV-2 RNA is generally detectable in upper respiratory specimens during the acute phase of infection. Positive results are indicative of the presence of  the identified virus, but do not rule out bacterial infection or co-infection with other pathogens not detected by the test. Clinical correlation with patient history and other diagnostic information is necessary to determine patient infection status. The expected result is  Negative.  Fact Sheet for Patients: BloggerCourse.com  Fact Sheet for Healthcare Providers: SeriousBroker.it  This test is not yet approved or cleared by the [place]  FDA and  has been authorized for detection and/or diagnosis of SARS-CoV-2 by FDA under an Emergency Use Authorization (EUA).  This EUA will remain in effect (meaning this test can be used) for the duration of  the COVID-19 declaration under Section 564(b)(1) of the A ct, 21 U.S.C. section 360bbb-3(b)(1), unless the authorization is terminated or revoked sooner.     Influenza A by PCR NEGATIVE NEGATIVE Final   Influenza B by PCR NEGATIVE NEGATIVE Final    Comment: (NOTE) The Xpert Xpress SARS-CoV-2/FLU/RSV plus assay is intended as an aid in the diagnosis of influenza from Nasopharyngeal swab specimens and should not be used as a sole basis for treatment. Nasal washings and aspirates are unacceptable for Xpert Xpress SARS-CoV-2/FLU/RSV testing.  Fact Sheet for Patients: BloggerCourse.com  Fact Sheet for Healthcare Providers: SeriousBroker.it  This test is not yet approved or cleared by the [place]  FDA and has been authorized for detection and/or diagnosis of SARS-CoV-2 by FDA under an Emergency Use Authorization (EUA). This EUA will remain in effect (meaning this test can be used) for the duration of the COVID-19 declaration under Section 564(b)(1) of the Act, 21 U.S.C. section 360bbb-3(b)(1), unless the authorization is terminated or revoked.     Resp Syncytial Virus by PCR NEGATIVE NEGATIVE Final    Comment: (NOTE) Fact Sheet for Patients: BloggerCourse.com  Fact Sheet for Healthcare Providers: SeriousBroker.it  This test is not yet approved or cleared by the [place]  FDA and has been authorized for detection and/or diagnosis of SARS-CoV-2  by FDA under an Emergency Use Authorization (EUA). This EUA will remain in effect (meaning this test can be used) for the duration of the COVID-19 declaration under Section 564(b)(1) of the Act, 21 U.S.C. section 360bbb-3(b)(1), unless the authorization is terminated or revoked.  Performed at [facility], [address] [place], [place] [address]     Time coordinating discharge: 45 minutes  Signed: Chapman Dezaree Tracey  [facility] 08/01/2023, 10:29 AM

## 2023-08-03 ENCOUNTER — Telehealth: Payer: Self-pay

## 2023-08-03 ENCOUNTER — Encounter: Admitting: Student

## 2023-08-03 NOTE — Transitions of Care (Post Inpatient/ED Visit) (Signed)
 08/03/2023  Name: [name] MRN: [id number] DOB: 1956-01-14  Today's TOC FU Call Status: Today's TOC FU Call Status:: Successful TOC FU Call Completed TOC FU Call Complete Date: 08/03/23 Patient's Name and Date of Birth confirmed.  Transition Care Management Follow-up Telephone Call Date of Discharge: 08/01/23 Discharge Facility: [facility]) Type of Discharge: Inpatient Admission Primary Inpatient Discharge Diagnosis:: COVID 19, COPD exacerbation How have you been since you were released from the hospital?: Better Any questions or concerns?: No  Items Reviewed: Did you receive and understand the discharge instructions provided?: Yes Medications obtained,verified, and reconciled?: Yes (Medications Reviewed) (The patient is not consistly taking his medications. He takes them when he wants to) Any new allergies since your discharge?: No Dietary orders reviewed?: NA Do you have support at home?: Yes People in Home [RPT]: alone Name of Support/Comfort Primary Source: Elsie Mura  Medications Reviewed Today: Medications Reviewed Today     Reviewed by Moises Reusing, RN (Case Manager) on 08/03/23 at 1537  Med List Status: <None>   Medication Order Taking? Sig Documenting Provider Last Dose Status Informant  albuterol  (VENTOLIN  HFA) 108 (90 Base) MCG/ACT inhaler [id number]  INHALE 2 PUFFS BY MOUTH EVERY 6 HOURS AS NEEDED FOR WHEEZING FOR SHORTNESS OF BREATH Byrum, Robert S, MD  Active Self  Aspirin -Salicylamide-Caffeine (BC HEADACHE POWDER PO) [id number]  Take 1 packet by mouth daily as needed (for headaches). [provider]  Active Self           Med Note (CRUTHIS, CHLOE C   Thu Jul 30, 2023  7:32 AM)    Atogepant (QULIPTA) 30 MG TABS [id number]  Take 30 mg by mouth daily. [provider]  Active Self           Med Note (CRUTHIS, CHLOE C   Thu Jul 30, 2023  7:32 AM)    cetirizine  (ZYRTEC  ALLERGY) 10 MG tablet [id number]  Take 1 tablet (10 mg total) by  mouth daily.  Patient taking differently: Take 10 mg by mouth daily as needed for allergies or rhinitis.   Harrie Bruckner, DO  Active Self  dexamethasone  (DECADRON ) 6 MG tablet [id number]  Take 1 tablet (6 mg total) by mouth daily for 5 days. Arlice Reichert, MD  Active   escitalopram  (LEXAPRO ) 10 MG tablet [id number]  Take 1 tablet (10 mg total) by mouth daily. Addie Perkins, DO  Active Self  finasteride  (PROSCAR ) 5 MG tablet [id number]  Take 5 mg by mouth daily. [provider]  Active Self  fluticasone  (FLONASE ) 50 MCG/ACT nasal spray [id number]  Use 2 spray(s) in each nostril once daily  Patient taking differently: Place 2 sprays into both nostrils 2 (two) times daily as needed for allergies or rhinitis.   Francella Rogue, MD  Active Self  Fluticasone -Umeclidin-Vilant (TRELEGY ELLIPTA ) 200-62.5-25 MCG/ACT AEPB [id number]  INHALE 1 PUFF INTO LUNGS ONCE DAILY Byrum, Robert S, MD  Active Self  guaiFENesin  (MUCINEX ) 600 MG 12 hr tablet [id number]  Take 1 tablet (600 mg total) by mouth 2 (two) times daily for 7 days. Arlice Reichert, MD  Active   HYDROcodone -acetaminophen  (NORCO) 10-325 MG tablet [id number]  Take 0.5-1 tablets by mouth 3 (three) times daily as needed (for pain).  Patient not taking: Reported on 08/03/2023   [provider]  Active Self           Med Note MARISA, NATHANEL LOISE Schaumann Jul 30, 2023  4:38 PM) This causes constipation- per the patient  hydrocortisone  cream  1 % [id number]  Apply 1 Application topically 2 (two) times daily as needed for itching. [provider]  Active Self  hydrOXYzine  (ATARAX ) 25 MG tablet [id number]  Take 1 tablet (25 mg total) by mouth every 6 (six) hours as needed for anxiety. Addie Perkins, DO  Active Self  ipratropium (ATROVENT ) 0.03 % nasal spray [id number]  Place 2 sprays into both nostrils every 12 (twelve) hours.  Patient taking differently: Place 2 sprays into both nostrils every 12 (twelve) hours as needed for rhinitis.   Byrum, Robert  S, MD  Active Self  ipratropium-albuterol  (DUONEB) 0.5-2.5 (3) MG/3ML SOLN [id number]  Take 3 mLs by nebulization every 6 (six) hours as needed (for shortness of breath or wheezing). [provider]  Active Self  isosorbide  mononitrate (IMDUR ) 60 MG 24 hr tablet [id number]  Take 1 tablet (60 mg total) by mouth daily.  Patient not taking: Reported on 07/30/2023   Daneen Perkins BROCKS, NP  Expired 07/30/23 2359 Self           Med Note JACKOLYN WADDELL VEAR Pablo Jun 08, 2023 10:56 AM)    ketoconazole  (NIZORAL ) 2 % shampoo [id number]  Apply 1 Application topically every three (3) days as needed for irritation. [provider]  Active Self  lidocaine  (LIDODERM ) 5 % [id number]  Place 1 patch unto chest wall. Remove & Discard patch within 12 hours. Lou Claretta HERO, MD  Active Self           Med Note MARISA, NATHANEL LOISE Schaumann Jul 30, 2023  4:47 PM) The patient stated he IS wearing a patch from yesterday at this time  losartan  (COZAAR ) 25 MG tablet [id number]  Take 0.5 tablets (12.5 mg total) by mouth daily. Addie Perkins, DO  Active Self  mesalamine  (LIALDA ) 1.2 g EC tablet [id number]  Take 4 tablets (4.8 g total) by mouth daily with breakfast.  Patient taking differently: Take 1.2 g by mouth See admin instructions. Take 1.2 grams by mouth four times a day with food   Heinz, Sara E, PA-C  Active Self  metoCLOPramide  (REGLAN ) 5 MG tablet [id number]  Take 1 tablet (5 mg total) by mouth every 8 (eight) hours as needed for nausea. Arletta Camie BRAVO, PA-C  Active Self  nitroGLYCERIN  (NITROSTAT ) 0.4 MG SL tablet [id number]  Place 1 tablet (0.4 mg total) under the tongue every 5 (five) minutes as needed for chest pain. Daneen Perkins BROCKS, NP  Expired 07/30/23 2359 Self  omeprazole  (PRILOSEC) 40 MG capsule [id number]  Take 1 capsule (40 mg total) by mouth daily.  Patient taking differently: Take 40 mg by mouth daily before breakfast.   Tawkaliyar, Roya, DO  Active Self  OXYGEN  [id number]  Inhale 2 L/min into the lungs  continuous. [provider]  Active Self  predniSONE  (DELTASONE ) 10 MG tablet [id number]  Take 1 tablet (10 mg total) by mouth See admin instructions. Take 10 mg by mouth every three days with food Dahal, Chapman, MD  Active   RESTASIS MULTIDOSE 0.05 % ophthalmic emulsion [id number]  Place 1 drop into the left eye 2 (two) times daily. [provider]  Active Self  rosuvastatin  (CRESTOR ) 20 MG tablet [id number]  Take 1 tablet (20 mg total) by mouth daily.  Patient not taking: Reported on 08/03/2023   Nooruddin, Saad, MD  Active             Home Care and Equipment/Supplies: Were Home Health Services Ordered?: No Any new equipment or medical  supplies ordered?: No  Functional Questionnaire: Do you need assistance with bathing/showering or dressing?: No Do you need assistance with meal preparation?: No Do you need assistance with eating?: No Do you have difficulty maintaining continence: No Do you need assistance with getting out of bed/getting out of a chair/moving?: No Do you have difficulty managing or taking your medications?: No  Follow up appointments reviewed: PCP Follow-up appointment confirmed?: Yes Date of PCP follow-up appointment?: 08/10/23 Follow-up Provider: [facility] Follow-up appointment confirmed?: Yes Date of Specialist follow-up appointment?: 09/02/23 Follow-Up Specialty Provider:: Lamar Chris Do you need transportation to your follow-up appointment?: No Do you understand care options if your condition(s) worsen?: Yes-patient verbalized understanding  SDOH Interventions Today    Flowsheet Row Most Recent Value  SDOH Interventions   Food Insecurity Interventions Intervention Not Indicated  Housing Interventions Intervention Not Indicated  Transportation Interventions Intervention Not Indicated  Utilities Interventions Intervention Not Indicated    Medford Balboa, BSN, RN [place]  [facility] - [facility] Health RN Care  Manager [phone]

## 2023-08-04 ENCOUNTER — Other Ambulatory Visit

## 2023-08-04 ENCOUNTER — Ambulatory Visit: Admitting: Physician Assistant
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
## 2023-08-10 ENCOUNTER — Ambulatory Visit

## 2023-08-10 ENCOUNTER — Telehealth: Payer: Self-pay | Admitting: Physician Assistant

## 2023-08-10 VITALS — BP 134/70 | HR 57 | Temp 97.7°F | Wt 152.2 lb

## 2023-08-10 DIAGNOSIS — J432 Centrilobular emphysema: Secondary | ICD-10-CM

## 2023-08-10 DIAGNOSIS — I25119 Atherosclerotic heart disease of native coronary artery with unspecified angina pectoris: Secondary | ICD-10-CM

## 2023-08-10 MED ORDER — NITROGLYCERIN 0.4 MG SL SUBL: 0.4000 mg | SUBLINGUAL_TABLET | SUBLINGUAL | 3 refills | Status: AC | PRN

## 2023-08-10 MED ORDER — TRELEGY ELLIPTA 200-62.5-25 MCG/ACT IN AEPB
1.0000 | INHALATION_SPRAY | Freq: Every day | RESPIRATORY_TRACT | 0 refills | Status: DC
Start: 2023-08-10 — End: 2023-12-03

## 2023-08-10 NOTE — Patient Instructions (Signed)
 Thank you, Mr.Keymani P Therrien for allowing us  to provide your care today. Today we discussed the following:  - worsening wheezing and breathing due to mucus build-up; continue use of your daily bronchodilators, fluticasone , and prednisone  10 mg. Please continue your albuterol  nebulizer and trial each AM and PM for right now.  - we will reach out to Dr. Shelah to see if he has any recommendations between now and your visit with him  - 1 month refill of Trillegy-Ellipta and nitroglycerin  sent   I have ordered the following medication/changed the following medications:   Stop the following medications: Medications Discontinued During This Encounter  Medication Reason   cetirizine  (ZYRTEC  ALLERGY) 10 MG tablet Completed Course   finasteride  (PROSCAR ) 5 MG tablet Change in therapy   HYDROcodone -acetaminophen  (NORCO) 10-325 MG tablet Completed Course   mesalamine  (LIALDA ) 1.2 g EC tablet Completed Course     Start the following medications: No orders of the defined types were placed in this encounter.    Follow up: 3 months    Remember:  - Please follow up with your Pulmonologist Dr. Shelah, Urologist, Oncologist, GI doctor, and Cardiologist.   Should you have any questions or concerns please call the Internal Medicine Clinic at [phone].     Doyal Miyamoto, MD [facility]

## 2023-08-10 NOTE — Assessment & Plan Note (Addendum)
 Patient with history of COPD on bronchodilators, prednisone , and fluticasone , who follows with Pulmonology Dr. Shelah. Recent hospital admission for COVID-19 infection has flared his symptoms from baseline, with reports of greater fatigue, morning episodes of wheezing, and sputum which he has had difficulty clearing. He finished dexamethasone  from hospital discharge, and has now transitioned back to his prednisone  10 mg every other day. He was feeling improvement, but in the past couple days since discontinuing dexamethasone , reports feeling like he is more fatigued and working harder to breathe. He was using Mucinex  to help clear the mucous, but reports he has not taken this in about 4 days. Occasional blood-tinged sputum production as well. Walk test with nurse in office today at baseline 2L O2 with oxygen  saturation of 89-95%, which is at goal for him given his COPD. Recommend that he continue the Mucinex  right now to help clear sputum, utilize albuterol  nebulizer AM and PM for now, and begin taking prednisone  10 mg daily. We will provide a 1 month refill of his Trelegy-Ellipta to bridge him until he can see Dr. Shelah. We will reach out to Dr. Shelah to provide and FYI of patient's current condition and to see if he recommends any changes between now and his appointment at the end of the month.  - refill Trelegy-Ellipta - increase to prednisone  10 mg daily from every other day - take Mucinex  for now - utilize flutter valve throughout the day - nebulizer albuterol  AM and PM

## 2023-08-10 NOTE — Assessment & Plan Note (Signed)
 Sees cardiology on 7/31. Reports no issues or chest pain in clinic today. - nitroglycerin  refill sent to bridge until cardiology appt at the end of the month

## 2023-08-10 NOTE — Progress Notes (Signed)
 Established Patient Office Visit  Subjective   Patient ID: [name], male    DOB: 20-Apr-1955  Age: 68 y.o. MRN: [id number]  Chief Complaint  Patient presents with   Hospitalization Follow-up   Back Pain   Abdominal Pain   [name] is a 68 yr old male with a history of HTN, HLD, CAD/MI, COPD, lung cancer (s/p [name] 06/2011) on chronic 2 LPM O2, GERD, anxiety/depression, and history of polysubstance use, who presents to clinic today for hospital follow up for COPD exacerbation and COVID-19 infection.   Follow up Hospitalization  Patient was admitted to [facility] on 07/29/23 and discharged on 08/01/23. He was treated for acute on chronic respiratory failure with hypoxia, exacerbation of COPD and COVID-19 infection. Treatment for this included IV steroids, with transition to oral steroids and continuation of bronchodilators. Telephone follow up was done on 08/03/23. He reports excellent compliance with treatment. He reports this condition is improved.   Since his hospitalization, he reports that overall he has improved, although he is still experiencing episodes of feeling shortness of breath after activities such as walking or being in the heat. He reports wheezing in the morning when he first wakes up, and if he is able to clear the mucous from his lungs, this is improved. Occasionally he will cough up red-tinged sputum when he finally is able to cough anything up. He reports complete adherence to his medications and recently finished dexamethasone  and has transitioned back to his home regimen of prednisone  10 mg every other day. He had a mild cough during this recent hospitalization, but states that this is improving, although he felt like he was making greater strides in improvement while on dex, but once he transitioned to his home prednisone  regimen his improvement has stagnated.    ----------------------------------------------------------------------------------------- -   ROS: Denies headaches, dizziness, fever, chills, runny nose, sore throat, vision changes, hearing changes, chest pain, nausea, vomiting, abdominal pain. Denies increased urinary frequency, pain with urination, constipation or diarrhea. No recent falls. Endorses increased fatigue with activities, and wheezing in the morning.      Objective:     BP 134/70 (BP Location: Right Arm, Patient Position: Sitting, Cuff Size: Small)   Pulse (!) 57   Temp 97.7 F (36.5 C) (Oral)   Wt 152 lb 3.2 oz (69 kg)   SpO2 92%   BMI 22.48 kg/m  BP Readings from Last 3 Encounters:  08/10/23 134/70  08/01/23 112/66  07/08/23 123/82   Wt Readings from Last 3 Encounters:  08/10/23 152 lb 3.2 oz (69 kg)  08/01/23 147 lb 11.3 oz (67 kg)  07/01/23 154 lb (69.9 kg)      Physical Exam:   Constitutional: well-appearing male sitting in exam chair, in no acute distress. Ambulates without use of assistance device, though today utilized clinic wheelchair from parking lot  HEENT: normocephalic atraumatic, mucous membranes moist Eyes: conjunctiva non-erythematous Cardiovascular: regular rate and rhythm, bilateral radial pulses 2+, bilateral dorsal pedal pulses 2+, brisk capillary refill bilateral hands  Pulmonary/Chest: mildly increased work of breathing on 2L O2 by nasal cannula, lungs clear to auscultation bilaterally, without wheezing or crackles, digital clubbing of bilateral hands   Abdominal: soft, non-tender, non-distended MSK: normal bulk and tone. Neurological: alert & oriented x 3 Skin: warm and dry Psych: mood calm, behavior normal, thought content normal, judgement normal     No results found for any visits on 08/10/23.     The ASCVD Risk score (Arnett  DK, et al., 2019) failed to calculate for the following reasons:   Risk score cannot be calculated because patient has a medical history suggesting  prior/existing ASCVD    Assessment & Plan:   Problem List Items Addressed This Visit       Cardiovascular and Mediastinum   CAD (coronary artery disease) - Primary   Sees cardiology on 7/31. Reports no issues or chest pain in clinic today. - nitroglycerin  refill sent to bridge until cardiology appt at the end of the month      Relevant Medications   nitroGLYCERIN  (NITROSTAT ) 0.4 MG SL tablet     Respiratory   COPD with emphysema (HCC)   Patient with history of COPD on bronchodilators, prednisone , and fluticasone , who follows with Pulmonology Dr. Shelah. Recent hospital admission for COVID-19 infection has flared his symptoms from baseline, with reports of greater fatigue, morning episodes of wheezing, and sputum which he has had difficulty clearing. He finished dexamethasone  from hospital discharge, and has now transitioned back to his prednisone  10 mg every other day. He was feeling improvement, but in the past couple days since discontinuing dexamethasone , reports feeling like he is more fatigued and working harder to breathe. He was using Mucinex  to help clear the mucous, but reports he has not taken this in about 4 days. Occasional blood-tinged sputum production as well. Walk test with nurse in office today at baseline 2L O2 with oxygen  saturation of 89-95%, which is at goal for him given his COPD. Recommend that he continue the Mucinex  right now to help clear sputum, utilize albuterol  nebulizer AM and PM for now, and begin taking prednisone  10 mg daily. We will provide a 1 month refill of his Trelegy-Ellipta to bridge him until he can see Dr. Shelah. We will reach out to Dr. Shelah to provide and FYI of patient's current condition and to see if he recommends any changes between now and his appointment at the end of the month.  - refill Trelegy-Ellipta - increase to prednisone  10 mg daily from every other day - take Mucinex  for now - utilize flutter valve throughout the day - nebulizer  albuterol  AM and PM       Relevant Medications   Fluticasone -Umeclidin-Vilant (TRELEGY ELLIPTA ) 200-62.5-25 MCG/ACT AEPB    Return in about 3 months (around 11/10/2023) for recheck.    Patient discussed with Dr. Trudy, who also saw and evaluated the patient.  Terry Miyamoto, MD [facility] Internal Medicine  PGY-1  08/10/23, 5:29 PM

## 2023-08-10 NOTE — Telephone Encounter (Signed)
 Rescheduled appointments per provider requested due to limited availability.

## 2023-08-12 ENCOUNTER — Other Ambulatory Visit

## 2023-08-12 ENCOUNTER — Ambulatory Visit: Admitting: Physician Assistant

## 2023-08-13 ENCOUNTER — Ambulatory Visit: Admitting: Licensed Clinical Social Worker

## 2023-08-13 DIAGNOSIS — F32A Depression, unspecified: Secondary | ICD-10-CM

## 2023-08-13 DIAGNOSIS — F419 Anxiety disorder, unspecified: Secondary | ICD-10-CM

## 2023-08-13 NOTE — BH Specialist Note (Addendum)
 Integrated Behavioral Health via Telemedicine Visit  08/13/2023 TOBIAS AVITABILE [id number]  Number of Integrated Behavioral Health Clinician visits: Additional Visit  Session Start time: 1330   Session End time: 1430  Total time in minutes: 60    Referring Provider: PCP Patient/Family location: Home [facility] Provider location: Office All persons participating in visit: [facility] and Patient Types of Service: Individual psychotherapy  I connected with [name]  via  Telephone Application  (Video is Caregility application) and verified that I am speaking with the correct person using two identifiers. Discussed confidentiality: Yes   I discussed the limitations of telemedicine and the availability of in person appointments.  Discussed there is a possibility of technology failure and discussed alternative modes of communication if that failure occurs.  I discussed that engaging in this telemedicine visit, they consent to the provision of behavioral healthcare and the services will be billed under their insurance.  Patient and/or legal guardian expressed understanding and consented to Telemedicine visit: Yes   Presenting Concerns: Patient and/or family reports the following symptoms/concerns:  S - Subjective: Client participated in today's session via HIPAA-compliant telehealth platform. Client expressed ongoing frustration and emotional exhaustion, stating, "It's always something," in reference to persistent medical concerns, financial stress, and life instability. Client reported feeling overwhelmed, discouraged, and unsure how much more they can handle. Denied suicidal or homicidal ideation, auditory or visual hallucinations.  O - Objective: Client appeared tired but cooperative and engaged throughout the session. Speech was clear, thought process logical, and affect appropriate but flat at times. Client demonstrated insight into emotional state and a desire for support and  stability.  A - Assessment: Client is coping with chronic life stressors, including financial instability and health challenges, contributing to low mood, anxiety, and emotional fatigue. Symptoms consistent with situational stress and mild depressive features. No immediate safety concerns at this time. Client shows resilience and openness to therapeutic intervention.  P - Plan:  LCSW-A will continue to provide supportive therapy focused on stress reduction, emotional regulation, and resource navigation  Introduced idea of a "resilience routine" to help client regain a sense of control  Discussed importance of identifying micro-wins and realistic daily goals  Therapist will assist in identifying appropriate community supports for financial and medical needs  Encouraged journaling and use of positive affirmations between sessions  Duration of problem: more than 12 months; Severity of problem: moderate  Patient and/or Family's Strengths/Protective Factors: Social connections  Goals Addressed: Patient will:  Reduce symptoms of: anxiety and depression   Increase knowledge and/or ability of: coping skills   Demonstrate ability to: Increase healthy adjustment to current life circumstances  Progress towards Goals: Ongoing    Interventions: Interventions utilized:  CBT Cognitive Behavioral Therapy Standardized Assessments completed: Not Needed    Patient and/or Family Response: Patient agrees to treatment plan  Clinical Assessment/Diagnosis  Anxiety and depression    Assessment: Patient currently experiencing Anxiety and Depression.   Patient may benefit from Individual counseling.  Plan: Follow up with behavioral health clinician on : 07/29 via in person   I discussed the assessment and treatment plan with the patient and/or parent/guardian. They were provided an opportunity to ask questions and all were answered. They agreed with the plan and demonstrated an  understanding of the instructions.   They were advised to call back or seek an in-person evaluation if the symptoms worsen or if the condition fails to improve as anticipated.  [name] Pontes, MSW, LCSW-A She/Her Behavioral Health Clinician [facility]  Internal Medicine Center Direct [phone]  Fax [phone]

## 2023-08-14 ENCOUNTER — Telehealth: Payer: Self-pay

## 2023-08-17 ENCOUNTER — Other Ambulatory Visit: Payer: Self-pay | Admitting: Emergency Medicine

## 2023-08-17 ENCOUNTER — Other Ambulatory Visit: Payer: Self-pay | Admitting: Student

## 2023-08-17 DIAGNOSIS — J301 Allergic rhinitis due to pollen: Secondary | ICD-10-CM

## 2023-08-18 ENCOUNTER — Other Ambulatory Visit: Payer: Self-pay
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
## 2023-08-19 NOTE — Patient Instructions (Signed)
 Visit Information  Thank you for taking time to visit with me today. Please don't hesitate to contact me if I can be of assistance to you before our next scheduled appointment.  Telephone follow up appointment with care management team member scheduled for:  09/18/23 330 pm  Olam Hoots [facility]     Please call the care guide team at [phone] if you need to cancel or reschedule your appointment.   Following is a copy of your care plan:   Goals Addressed             This Visit's Progress    VBCI RN Care Plan       Problems:  Chronic Disease Management support and education needs related to COPD  Goal: Over the next 30 days the Patient will attend all scheduled medical appointments: with PCP and specialist as evidenced by keeping al scheduled appointments        demonstrate Ongoing adherence to prescribed treatment plan for COPD as evidenced by no admissions to the hospital verbalize basic understanding of COPD disease process and self health management plan as evidenced by verbal explanation lifestyle changes and consistent medication compliance  Interventions:   COPD Interventions: Advised patient to track and manage COPD triggers Advised patient to self assesses COPD action plan zone and make appointment with provider if in the yellow zone for 48 hours without improvement Assessed social determinant of health barriers Discussed the importance of adequate rest and management of fatigue with COPD Provided education about and advised patient to utilize infection prevention strategies to reduce risk of respiratory infection Screening for signs and symptoms of depression related to chronic disease state  Use of home oxygen -talk with your physician to see about adding humidifier to help with headaches Continue bronchodilators as before Continue to follow-up with pulmonology Dr. Shelah. Make sure to wear a mask when out Try to drink warm liquids to help loosen phlegm Ask question at  your physician appointments   Patient Self-Care Activities:  Attend all scheduled provider appointments Call pharmacy for medication refills 3-7 days in advance of running out of medications Call provider office for new concerns or questions  Perform all self care activities independently  Take medications as prescribed   begin a symptom diary develop a rescue plan follow rescue plan if symptoms flare-up do breathing exercises every day  Plan:  Telephone follow up appointment with care management team member scheduled for:  09/18/23 330 pm  Olam Hoots RNCM             Please call the [place]  National Suicide Prevention Lifeline: (604)714-1182 or TTY: 360 808 1649 TTY (432)[phone]) to talk to a trained counselor call 1-800-273-TALK (toll free, 24 hour hotline) call 911 if you are experiencing a Mental Health or Behavioral Health Crisis or need someone to talk to.  Patient verbalizes understanding of instructions and care plan provided today and agrees to view in MyChart. Active MyChart status and patient understanding of how to access instructions and care plan via MyChart confirmed with patient.     Wilbert Diver RN, BSN, [facility]  [facility], [facility]    Care Coordinator Phone: [phone]

## 2023-08-20 ENCOUNTER — Encounter: Payer: Self-pay | Admitting: Gastroenterology

## 2023-08-20 ENCOUNTER — Ambulatory Visit: Admitting: Gastroenterology

## 2023-08-20 VITALS — BP 110/72 | HR 74 | Ht 69.0 in | Wt 147.5 lb

## 2023-08-20 DIAGNOSIS — R110 Nausea: Secondary | ICD-10-CM

## 2023-08-20 DIAGNOSIS — R1084 Generalized abdominal pain: Secondary | ICD-10-CM

## 2023-08-20 DIAGNOSIS — K3184 Gastroparesis: Secondary | ICD-10-CM

## 2023-08-20 DIAGNOSIS — K5900 Constipation, unspecified: Secondary | ICD-10-CM

## 2023-08-20 DIAGNOSIS — K529 Noninfective gastroenteritis and colitis, unspecified: Secondary | ICD-10-CM | POA: Diagnosis not present

## 2023-08-20 DIAGNOSIS — R630 Anorexia: Secondary | ICD-10-CM

## 2023-08-20 DIAGNOSIS — R1310 Dysphagia, unspecified: Secondary | ICD-10-CM

## 2023-08-20 DIAGNOSIS — K219 Gastro-esophageal reflux disease without esophagitis: Secondary | ICD-10-CM | POA: Diagnosis not present

## 2023-08-20 DIAGNOSIS — K429 Umbilical hernia without obstruction or gangrene: Secondary | ICD-10-CM

## 2023-08-20 DIAGNOSIS — R058 Other specified cough: Secondary | ICD-10-CM

## 2023-08-20 NOTE — Patient Instructions (Signed)
 Please follow Constipation Regimen -  Purchase over the counter - Fiber Con 1 tablespoon daily.   Miralax  1 capful daily   You may also use over the counter stool softener like Colace, in no improvement with your bowel movements.   Continue Omeprazole .   Complete stool study- Fecal Calprotectin.   Keep follow-up appointment with Dr. Wilhelmenia.   _______________________________________________________  If your blood pressure at your visit was 140/90 or greater, please contact your primary care physician to follow up on this.  _______________________________________________________  If you are age 84 or older, your body mass index should be between 23-30. Your Body mass index is 21.78 kg/m. If this is out of the aforementioned range listed, please consider follow up with your Primary Care Provider.  If you are age 84 or younger, your body mass index should be between 19-25. Your Body mass index is 21.78 kg/m. If this is out of the aformentioned range listed, please consider follow up with your Primary Care Provider.   ________________________________________________________  The [facility] providers would like to encourage you to use MYCHART to communicate with providers for non-urgent requests or questions.  Due to long hold times on the telephone, sending your provider a message by [facility] may be a faster and more efficient way to get a response.  Please allow 48 business hours for a response.  Please remember that this is for non-urgent requests.  _______________________________________________________  Thank you for choosing me and [facility].

## 2023-08-20 NOTE — Progress Notes (Signed)
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 cough Patient with recent COVID infection, on supplemental oxygen  for COPD, tells me he has been waking up with a productive cough, green mucus.  Advised that he contact  his pulmonologist for further evaluation, possible bronchitis.   Camie Furbish, PA-C [facility] 08/20/2023, 2:55 PM  Patient Care Team: Nooruddin, Saad, MD as PCP - General (Internal Medicine) Darron Deatrice LABOR, MD as PCP - Cardiology (Cardiology) Sherrod Sherrod, MD (Hematology and Oncology) Jori Small, FNP as Referring Physician (Nurse Practitioner) Weyman Corning, RN as [facility] Care Management Bouchard, Duwaine BROCKS, RN as Oncology Nurse Navigator Weyman Corning, RN as [facility] Care Management

## 2023-08-21 NOTE — Progress Notes (Signed)
 Attending Physician's Attestation   I have reviewed the chart.   I agree with the Advanced Practitioner's note, impression, and recommendations with any updates as below. Difficult case.  I think that endoscopic reevaluation is reasonable, as he comes on and off medications frequently.  He is higher risk for procedures, but that may be the only next steps for him since he has gone through the rest of treatments.  Will see him at his follow-up with me.   Aloha Finner, MD [facility] Advanced Endoscopy Office # [phone]
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
## 2023-08-28 ENCOUNTER — Other Ambulatory Visit: Payer: Self-pay | Admitting: Physician Assistant

## 2023-08-28 DIAGNOSIS — C3490 Malignant neoplasm of unspecified part of unspecified bronchus or lung: Secondary | ICD-10-CM

## 2023-08-31 ENCOUNTER — Inpatient Hospital Stay (HOSPITAL_BASED_OUTPATIENT_CLINIC_OR_DEPARTMENT_OTHER): Admitting: Physician Assistant

## 2023-08-31 ENCOUNTER — Inpatient Hospital Stay: Attending: Physician Assistant

## 2023-08-31 VITALS — BP 117/74 | HR 61 | Temp 98.0°F | Resp 19 | Wt 149.7 lb

## 2023-08-31 DIAGNOSIS — R918 Other nonspecific abnormal finding of lung field: Secondary | ICD-10-CM | POA: Diagnosis present

## 2023-08-31 DIAGNOSIS — Z9981 Dependence on supplemental oxygen: Secondary | ICD-10-CM | POA: Diagnosis not present

## 2023-08-31 DIAGNOSIS — Z902 Acquired absence of lung [part of]: Secondary | ICD-10-CM | POA: Insufficient documentation

## 2023-08-31 DIAGNOSIS — Z85528 Personal history of other malignant neoplasm of kidney: Secondary | ICD-10-CM | POA: Insufficient documentation

## 2023-08-31 DIAGNOSIS — Z85118 Personal history of other malignant neoplasm of bronchus and lung: Secondary | ICD-10-CM | POA: Diagnosis not present

## 2023-08-31 DIAGNOSIS — Z7952 Long term (current) use of systemic steroids: Secondary | ICD-10-CM | POA: Insufficient documentation

## 2023-08-31 DIAGNOSIS — C3490 Malignant neoplasm of unspecified part of unspecified bronchus or lung: Secondary | ICD-10-CM

## 2023-08-31 DIAGNOSIS — Z905 Acquired absence of kidney: Secondary | ICD-10-CM | POA: Diagnosis not present

## 2023-08-31 LAB — CBC WITH DIFFERENTIAL (CANCER CENTER ONLY)
Abs Immature Granulocytes: 0.16 K/uL — ABNORMAL HIGH (ref 0.00–0.07)
Basophils Absolute: 0.1 K/uL (ref 0.0–0.1)
Basophils Relative: 1 %
Eosinophils Absolute: 0.1 K/uL (ref 0.0–0.5)
Eosinophils Relative: 2 %
HCT: 37.6 % — ABNORMAL LOW (ref 39.0–52.0)
Hemoglobin: 12.2 g/dL — ABNORMAL LOW (ref 13.0–17.0)
Immature Granulocytes: 2 %
Lymphocytes Relative: 14 %
Lymphs Abs: 1.1 K/uL (ref 0.7–4.0)
MCH: 29.8 pg (ref 26.0–34.0)
MCHC: 32.4 g/dL (ref 30.0–36.0)
MCV: 91.9 fL (ref 80.0–100.0)
Monocytes Absolute: 0.7 K/uL (ref 0.1–1.0)
Monocytes Relative: 9 %
Neutro Abs: 5.3 K/uL (ref 1.7–7.7)
Neutrophils Relative %: 72 %
Platelet Count: 355 K/uL (ref 150–400)
RBC: 4.09 MIL/uL — ABNORMAL LOW (ref 4.22–5.81)
RDW: 15.2 % (ref 11.5–15.5)
WBC Count: 7.4 K/uL (ref 4.0–10.5)
nRBC: 0 % (ref 0.0–0.2)

## 2023-08-31 LAB — CMP (CANCER CENTER ONLY)
ALT: 6 U/L (ref 0–44)
AST: 11 U/L — ABNORMAL LOW (ref 15–41)
Albumin: 3.5 g/dL (ref 3.5–5.0)
Alkaline Phosphatase: 118 U/L (ref 38–126)
Anion gap: 7 (ref 5–15)
BUN: 8 mg/dL (ref 8–23)
CO2: 26 mmol/L (ref 22–32)
Calcium: 9.1 mg/dL (ref 8.9–10.3)
Chloride: 107 mmol/L (ref 98–111)
Creatinine: 1.18 mg/dL (ref 0.61–1.24)
GFR, Estimated: >60 mL/min (ref >60)
Glucose, Bld: 120 mg/dL — ABNORMAL HIGH (ref 70–99)
Potassium: 3.8 mmol/L (ref 3.5–5.1)
Sodium: 140 mmol/L (ref 135–145)
Total Bilirubin: 0.4 mg/dL (ref 0.0–1.2)
Total Protein: 6.8 g/dL (ref 6.5–8.1)

## 2023-09-01 ENCOUNTER — Ambulatory Visit: Admitting: Licensed Clinical Social Worker

## 2023-09-01 DIAGNOSIS — F419 Anxiety disorder, unspecified: Secondary | ICD-10-CM

## 2023-09-01 DIAGNOSIS — F32A Depression, unspecified: Secondary | ICD-10-CM

## 2023-09-01 NOTE — BH Specialist Note (Signed)
 Integrated Behavioral Health Follow Up In-Person Visit  MRN: [id number] Name: [name]  Number of Integrated Behavioral Health Clinician visits: Additional Visit  Session Start time: 1430   Session End time: 1530  Total time in minutes: 60    Types of Service: Individual psychotherapy and Health & Behavioral Assessment/Intervention  Interpretor:No. Interpretor Name and Language: N/A  Subjective: [name] is a 68 y.o. male  Patient was referred by PCP for Counseling. Patient reports the following symptoms/concerns: Session was spent discussing financial crisis patient is current experiencing. Patient is in need of $582.00 to prevent eviction. Session was spent identifying resources and programs to assist.  Duration of problem: Less than two weeks; Severity of problem: severe  Objective: Mood: Anxious and Affect: Stressed Risk of harm to self or others: No plan to harm self or others  Life Context: Family and Social: N/A School/Work: N/A Self-Care: N/A Life Changes: Financial Strain due to medical issues.  Patient and/or Family's Strengths/Protective Factors: Sense of purpose  Goals Addressed: Patient will:  Reduce symptoms of: stress    Progress towards Goals: Ongoing  Interventions: Interventions utilized:  Link to [employer] Standardized Assessments completed: Not Needed      Patient and/or Family Response: Patient agrees to have [facility] assist with identifying resources for financial strain.   Patient Centered Plan: Patient is on the following Treatment Plan(s): Patient will follow up with [facility] in two weeks.  Clinical Assessment/Diagnosis  Anxiety and depression    Assessment: Patient currently experiencing Financial Stress.   Patient may benefit from Ongoing Case management and counseling.  Plan: Follow up with behavioral health clinician on : Within two weeks.  [name] Pontes, MSW, LCSW-A [facility]  Clinician [facility]

## 2023-09-02 ENCOUNTER — Ambulatory Visit (INDEPENDENT_AMBULATORY_CARE_PROVIDER_SITE_OTHER): Admitting: Emergency Medicine

## 2023-09-02 ENCOUNTER — Encounter: Payer: Self-pay | Admitting: Emergency Medicine

## 2023-09-02 VITALS — BP 126/85 | HR 66 | Temp 98.0°F | Ht 69.0 in | Wt 149.0 lb

## 2023-09-02 DIAGNOSIS — Z0000 Encounter for general adult medical examination without abnormal findings: Secondary | ICD-10-CM | POA: Insufficient documentation

## 2023-09-02 DIAGNOSIS — Z79899 Other long term (current) drug therapy: Secondary | ICD-10-CM | POA: Insufficient documentation

## 2023-09-02 DIAGNOSIS — J439 Emphysema, unspecified: Secondary | ICD-10-CM | POA: Diagnosis not present

## 2023-09-02 DIAGNOSIS — Z139 Encounter for screening, unspecified: Secondary | ICD-10-CM | POA: Insufficient documentation

## 2023-09-02 DIAGNOSIS — C3490 Malignant neoplasm of unspecified part of unspecified bronchus or lung: Secondary | ICD-10-CM | POA: Diagnosis not present

## 2023-09-02 DIAGNOSIS — J9611 Chronic respiratory failure with hypoxia: Secondary | ICD-10-CM

## 2023-09-02 MED ORDER — CEFUROXIME AXETIL 250 MG PO TABS: 250.0000 mg | ORAL_TABLET | Freq: Two times a day (BID) | ORAL | 0 refills | Status: DC

## 2023-09-02 MED ORDER — DOXYCYCLINE HYCLATE 100 MG PO TABS: 100.0000 mg | ORAL_TABLET | Freq: Two times a day (BID) | ORAL | 0 refills | Status: DC

## 2023-09-02 MED ORDER — AZITHROMYCIN 250 MG PO TABS: 250.0000 mg | ORAL_TABLET | Freq: Every day | ORAL | 0 refills | Status: DC

## 2023-09-02 NOTE — Assessment & Plan Note (Signed)
 Please continue Trelegy once daily.  Rinse and gargle after using. Please continue your rotating antibiotics for the first week of each month as you have been doing. Continue prednisone  10 mg once daily. Keep your DuoNeb available to use when needed for shortness of breath. Follow with Dr Shelah in 3 months or sooner if you have any problems.

## 2023-09-02 NOTE — Patient Instructions (Signed)
 Get your repeat CT scan of the chest with Dr. Sherrod in September as planned. Please continue Trelegy once daily.  Rinse and gargle after using. Please continue your rotating antibiotics for the first week of each month as you have been doing. Continue prednisone  10 mg once daily. Keep your DuoNeb available to use when needed for shortness of breath. Continue your fluticasone  nasal spray. We discussed your surgical risk today.  You are being evaluated for possible hernia surgery.  You are at high risk for general anesthesia due to your COPD and chronic oxygen  needs.  This means that you are at increased risk for prolonged ventilation, prolonged hospitalization or even death.  There is no absolute contraindication to proceeding with surgery if the benefits outweigh these risks.  Please discuss this further with your surgeons. Follow with Dr Shelah in 3 months or sooner if you have any problems.

## 2023-09-02 NOTE — Progress Notes (Signed)
 Subjective:    Patient ID: [name], male    DOB: October 06, 1955, 68 y.o.   MRN: [id number]  COPD He complains of cough and shortness of breath. There is no wheezing. Pertinent negatives include no ear pain, fever, headaches, postnasal drip, rhinorrhea, sneezing, sore throat or trouble swallowing. His past medical history is significant for COPD.   ROV 05/07/2023 --[name] has very severe COPD, history of adenocarcinoma with bilateral surgical resections.  His symptoms include dyspnea, chronic bronchitis, chronic allergic rhinitis.  He has chronic hypoxemic respiratory failure.  I have him on Trelegy, rotating antibiotics (cefuroxime , doxycycline , azithromycin ), prednisone  10 mg daily, DuoNeb, Flonase . He has waxing and waning pulmonary nodular disease on serial imaging including most recently PET scan done 01/09/2023  CT scan of the chest 04/09/2023 reviewed by me, shows significant emphysema, continued enlargement of a branching right upper lobe nodule now 2.3 x 0.8 cm, new 9 x 4 mm right upper lobe nodule, progressive nodular consolidation inferiorly in the right lower lobe 2.2 x 1.3 cm, several new left lower lobe nodules measuring up to 8 mm including at least 1 nodule that is decreased in size.  Stable 1.5 cm left lower lobe nodule  ROV 09/02/2023 --[name] is 68 with a history of very severe COPD and associated emphysema, history of adenocarcinoma with bilateral surgical resections, chronic bronchitic symptoms.  He has associated chronic hypoxemic respiratory failure and is on Trelegy, rotating antibiotics, prednisone  10 mg daily, DuoNeb.  He is also on fluticasone  nasal spray.  He has had waxing and waning pulmonary nodular disease that we have followed with serial imaging.  He had a hospitalization for COVID-19 in June with chest CTs at that time.  There was a new focal nodular area in the superior segment of the right lower lobe 2.2 cm.  This improved on a follow-up CT done on later in the  month, consistent with inflammation or infection.  He needs a repeat CT in September.  Has been evaluated for surgical hernia repair and is here for pre-op risk assessment.  He is having difficulty walking due to the hernia pain, as well as SOB. He is reliable with his oxygen .                                                                                                                                             Review of Systems  Constitutional:  Negative for fever and unexpected weight change.  HENT:  Positive for congestion. Negative for dental problem, ear pain, nosebleeds, postnasal drip, rhinorrhea, sinus pressure, sneezing, sore throat and trouble swallowing.   Eyes:  Negative for redness and itching.  Respiratory:  Positive for cough and shortness of breath. Negative for chest tightness and wheezing.   Cardiovascular:  Positive for palpitations. Negative for leg swelling.  Gastrointestinal:  Negative for nausea and vomiting.  Genitourinary:  Negative for dysuria.  Musculoskeletal:  Negative for joint swelling.  Skin:  Negative for rash.  Neurological:  Negative for headaches.  Hematological:  Does not bruise/bleed easily.  Psychiatric/Behavioral:  Positive for dysphoric mood. The patient is nervous/anxious.        Objective:   Physical Exam  Vitals:   09/02/23 1420  BP: 126/85  Pulse: 66  Temp: 98 F (36.7 C)  TempSrc: Temporal  SpO2: 93%  Weight: 149 lb (67.6 kg)  Height: 5' 9 (1.753 m)    Gen: Pleasant, well-nourished, in no distress, depressed affect  ENT: No lesions,  mouth clear,  oropharynx clear, no postnasal drip, evolving some cushingoid facies  Neck: No JVD, no stridor  Lungs: No use of accessory muscles, very distant, overall clear, some mild wheeze on forced expiration  Cardiovascular: RRR, heart sounds normal, no murmur or gallops, trace peripheral edema  Musculoskeletal: no deformities   Neuro: alert, non focal  Skin: Warm, no lesions or  rashes     Assessment & Plan:  COPD with emphysema (HCC) Please continue Trelegy once daily.  Rinse and gargle after using. Please continue your rotating antibiotics for the first week of each month as you have been doing. Continue prednisone  10 mg once daily. Keep your DuoNeb available to use when needed for shortness of breath. Follow with Dr Shelah in 3 months or sooner if you have any problems.  Lung cancer [facility]) Follow-up plan is repeat CT scan of the chest in September and follow-up with Dr. Sherrod.  Chronic respiratory failure with hypoxia (HCC) Continue supplemental oxygen   Risk and functional assessment We discussed your surgical risk today.  You are being evaluated for possible hernia surgery.  You are at high risk for general anesthesia due to your COPD and chronic oxygen  needs.  This means that you are at increased risk for prolonged ventilation, prolonged hospitalization or even death.  There is no absolute contraindication to proceeding with surgery if the benefits outweigh these risks.  Please discuss this further with your surgeons.     Lamar Shelah, MD, PhD 09/02/2023, 2:50 PM [facility] [phone] or if no answer [phone]

## 2023-09-02 NOTE — Assessment & Plan Note (Signed)
 Follow-up plan is repeat CT scan of the chest in September and follow-up with Dr. Sherrod.

## 2023-09-02 NOTE — Assessment & Plan Note (Signed)
 Continue supplemental oxygen.

## 2023-09-02 NOTE — Assessment & Plan Note (Signed)
 We discussed your surgical risk today.  You are being evaluated for possible hernia surgery.  You are at high risk for general anesthesia due to your COPD and chronic oxygen  needs.  This means that you are at increased risk for prolonged ventilation, prolonged hospitalization or even death.  There is no absolute contraindication to proceeding with surgery if the benefits outweigh these risks.  Please discuss this further with your surgeons.

## 2023-09-03 ENCOUNTER — Ambulatory Visit: Attending: Nurse Practitioner | Admitting: Nurse Practitioner

## 2023-09-03 ENCOUNTER — Encounter: Payer: Self-pay | Admitting: Nurse Practitioner

## 2023-09-03 VITALS — BP 100/62 | HR 63 | Ht 69.0 in | Wt 148.0 lb

## 2023-09-03 DIAGNOSIS — R030 Elevated blood-pressure reading, without diagnosis of hypertension: Secondary | ICD-10-CM | POA: Diagnosis not present

## 2023-09-03 DIAGNOSIS — E785 Hyperlipidemia, unspecified: Secondary | ICD-10-CM

## 2023-09-03 DIAGNOSIS — J449 Chronic obstructive pulmonary disease, unspecified: Secondary | ICD-10-CM

## 2023-09-03 DIAGNOSIS — I2510 Atherosclerotic heart disease of native coronary artery without angina pectoris: Secondary | ICD-10-CM | POA: Diagnosis not present

## 2023-09-03 DIAGNOSIS — C3490 Malignant neoplasm of unspecified part of unspecified bronchus or lung: Secondary | ICD-10-CM

## 2023-09-03 DIAGNOSIS — Z01810 Encounter for preprocedural cardiovascular examination: Secondary | ICD-10-CM | POA: Diagnosis not present

## 2023-09-03 DIAGNOSIS — J9611 Chronic respiratory failure with hypoxia: Secondary | ICD-10-CM

## 2023-09-03 NOTE — Patient Instructions (Signed)
 Medication Instructions:  Your physician recommends that you continue on your current medications as directed. Please refer to the Current Medication list given to you today.  *If you need a refill on your cardiac medications before your next appointment, please call your pharmacy*  Lab Work: NONE ordered at this time of appointment   Testing/Procedures: NONE ordered at this time of appointment   Follow-Up: At [facility], you and your health needs are our priority.  As part of our continuing mission to provide you with exceptional heart care, our providers are all part of one team.  This team includes your primary Cardiologist (physician) and Advanced Practice Providers or APPs (Physician Assistants and Nurse Practitioners) who all work together to provide you with the care you need, when you need it.  Your next appointment:   6 month(s)  Provider:   Dr. Ladona    We recommend signing up for the patient portal called MyChart.  Sign up information is provided on this After Visit Summary.  MyChart is used to connect with patients for Virtual Visits (Telemedicine).  Patients are able to view lab/test results, encounter notes, upcoming appointments, etc.  Non-urgent messages can be sent to your provider as well.   To learn more about what you can do with MyChart, go to [email or web address].   Other Instructions

## 2023-09-03 NOTE — Progress Notes (Signed)
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  Myoview  in 2016 showed no ischemia, EF 45%. Echo in 2016 showed normal LV function, no wall motion abnormality. Coronary CT angiogram in 08/2021 revealed calcium  score of 1848, 99 percentile, with concern for obstructive plaque in the LAD, however, FFR was negative with the exception of a most distal part of the apical LAD. Medical management was advised.   He reports mild intermittent chest discomfort, unchanged from prior visits, as well as stable chronic dyspnea on exertion. We discussed possible cardiac testing, he declines.  He understands associated risk with surgery and is willing to accept the risk. Continue to monitor symptoms.  Reviewed ED precautions. Continue Crestor .   2. Leg pain/former tobacco use: History of intermittent leg cramping/leg pain, coolness to his lower extremities.  ABIs in 10/2021 were normal.  He denies worsening symptoms.   3. Elevated blood pressure: BP well controlled.  No indication for medication at this time.   3. Hyperlipidemia: LDL was 93 in 09/2022, above goal.  Consider repeat  labs at follow-up.  Continue Crestor .   4. Lung cancer/COPD/chronic hypoxemic respiratory failure: On 2L home O2. Stable chronic dyspnea.  Follows with pulmonology.   6.  Preoperative cardiac exam: According to the Revised Cardiac Risk Index (RCRI), his Perioperative Risk of Major Cardiac Event is (%): 0.4. His Functional Capacity in METs is: 5.72 according to the Duke Activity Status Index (DASI). Therefore, based on ACC/AHA guidelines, patient would be at acceptable risk for the planned procedure without further cardiovascular testing.  He has stable intermittent chest discomfort, stable dyspnea on exertion as above.  He  declines any further cardiac testing.  He understands the risk associated with surgery and is willing to proceed without additional testing. I will route this recommendation to the requesting party via Epic fax function.  7. Disposition: Follow-up in 6 months, sooner if needed.  Previously discussed with Dr. Darron recommendations for cardiologist in [place] sees patient small Dr. Darron in [place], but now lives in [place]). Dr. Darron did not state a preference.  Will have him establish with Dr. Ladona, DOD.   Damien JAYSON Braver, NP 09/06/2023, 2:14 PM

## 2023-09-06 ENCOUNTER — Encounter: Payer: Self-pay | Admitting: Nurse Practitioner

## 2023-09-07 ENCOUNTER — Ambulatory Visit: Payer: Self-pay | Admitting: Emergency Medicine

## 2023-09-07 DIAGNOSIS — J439 Emphysema, unspecified: Secondary | ICD-10-CM

## 2023-09-07 NOTE — Telephone Encounter (Signed)
 FYI Only or Action Required?: Action required by provider: DME order.  Patient is followed in Pulmonology for COPD, last seen on 09/02/2023 by Terry Lamar RAMAN, MD.  Called Nurse Triage reporting Care Management.  Symptoms began several days ago.  Interventions attempted: Maintenance inhaler and Home oxygen  use.  Triage Disposition: Call Specialist Today  Patient/caregiver understands and will follow disposition?: Yes     Copied from CRM [id number]. Topic: Clinical - Red Word Triage >> Sep 07, 2023 11:37 AM [name] wrote: Red Word that prompted transfer to Nurse Triage: Nebulizer machine is not working. It turns on and medicine fits in but thinks it may be leaking air somewhere, as it is not working how it should. Reason for Disposition  [1] Caller requesting NON-URGENT health information AND [2] PCP's office is the best resource  Answer Assessment - Initial Assessment Questions 1. REASON FOR CALL: What is the main reason for your call? or How can I best help you?     When I turn the machine on to try to inhale the medication... it don't seem to be working usually when it runs out, it makes a funny noise... but now it's not doing that and the medicine is still sitting down in the plastic piece it's not doing what it's supposed to Pt ensures that machine is plugged and no kinks/holes. Endorses this has been an issue for a few day 2. SYMPTOMS : Do you have any symptoms?      Wheezing  Triager does not appreciate audible SOB/wheezing during call. Pt is speaking in full sentences.  3. OTHER QUESTIONS: Do you have any other questions?     N/a    E2C2 Pulmonary Triage - Initial Assessment Questions Chief Complaint (e.g., cough, sob, wheezing, fever, chills, sweat or additional symptoms) *Go to specific symptom protocol after initial questions. See above  How long have symptoms been present? Few days  Have you tested for COVID or Flu? Note: If not, ask patient if a  home test can be taken. If so, instruct patient to call back for positive results. No  MEDICINES:   Have you used any OTC meds to help with symptoms? No If yes, ask What medications? N/a  Have you used your inhalers/maintenance medication? Yes If yes, What medications? Fluticasone -Umeclidin-Vilant (TRELEGY ELLIPTA ) - once in the AM Albuterol  INH PRN Duo-Neb - neb attempted to use this AM Triager reviewed/reinforced albuterol  usage and SIG. Triager advised to use Albuterol  INH in the interim until new neb machine can be ordered. Patient verbalized understanding and to call back with worsening symptoms.    If inhaler, ask How many puffs and how often? Note: Review instructions on medication in the chart. See above  OXYGEN : Do you wear supplemental oxygen ? Yes If yes, How many liters are you supposed to use? 2-2.5 L  Do you monitor your oxygen  levels? Yes If yes, What is your reading (oxygen  level) today? 95 on 2.5L HR 67  What is your usual oxygen  saturation reading?  (Note: Pulmonary O2 sats should be 90% or greater) N/a    Pt reports NEB machine has not been working for a few days, and has tried to troubleshoot without any success. Pt needing new NEB machine. Triager will forward encounter for Dr. Christin 's office to review and advise. Patient verbalized understanding and is expecting call back from office when new order is sent to DME.  Protocols used: Information Only Call - No Triage-A-AH

## 2023-09-08 NOTE — Telephone Encounter (Signed)
 Urgent referral for new neb machine has been placed  I called and spoke with the pt and notified that this was done  Nothing further needed

## 2023-09-10 NOTE — Telephone Encounter (Signed)
 NOTE FROM 09/02/23 WITH BYRUM FAXED TO [facility]

## 2023-09-11 ENCOUNTER — Ambulatory Visit: Payer: Self-pay | Admitting: Surgery

## 2023-09-14 NOTE — Telephone Encounter (Signed)
 [facility]  [facility]  Established Patient Screening Note  [name] is a 68 y.o. old male presenting for return patient visit.  Pain Description: Since the last visit the pain is unchanged. The most significant location of pain is the lower back and right hip. Other areas of pain include the bilateral legs and feet.   The 1-2 words that best describe the pain: sharp, throbbing, and dull The pain improves with over the counter medications. The pain is made worse with nothing. The average daily pain score is 4/10. The pain can be as high as 7/10 at its worst. The pain interferes with: All activities.  Therapies: Did the patient have an injection/procedure since the last visit? No  Has the patient had any new imaging (X-ray, MRI, CT) for pain concerns since the last visit? No  Has physical therapy been initiated or completed for this pain concern? No  Medications: Was a new medication for pain started by our clinic at the last visit? No Is the patient on a blood thinner (including aspirin , Goody/BC/Bayer powder)? Yes  - Name of anticoagulant/blood thinner: Goody/BC/Bayer Powder

## 2023-09-14 NOTE — Progress Notes (Signed)
 [facility] Established Patient Visit   Referring Doctor: No ref. provider found Primary Doctor: Pamila Lindau MD, MD Date of Service: 09/14/2023  Being seen by Sherlean New, PA-C today in the [facility]. _______________________________________________________________________________________________________  Chief Complaint  Patient presents with  . Back Pain    lower  . Hip Pain  . Leg Pain    both  . Foot Pain    both   _______________________________________________________________________________________________________  History of Present Illness: [name] is a 68 y.o. male.  Patient denies any major changes in their health since the last visit.   Pain Description: Since the last visit the pain is unchanged. The most significant location of pain is the low The pain radiates to the right buttock/hip. Other areas of pain include the bilateral lower extremities and bilateral feet.   The 1-2 words that best describe the pain: sharp, throbbing, and dull The pain improves with over the counter and prescription medications. The pain is made worse with prolonged standing, ambulation, movement, and general daily activities.  The average daily pain score is a 4/10. The pain can be as high as a 7/10 at its worst.  The pain interferes with: All activities.   Therapies: Did the patient have an injection/procedure since the last visit? No   Has the patient had any new imaging (X-ray, MRI, CT) for pain concerns since the last visit? No   Has physical therapy been initiated or completed for this pain concern? Yes - Where was physical therapy completed? At a [facility] based clinic Has a home exercise program (directed by a physical therapist or medical provider) been completed for this pain concern? Yes   Medications: Was a new medication for pain started by our clinic at the last visit? No  Is the  patient on a blood thinner (including aspirin , Goody/BC/Bayer powder)? Yes: Goody/BC/Bayer Powder _______________________________________________________________________________________________________  Historical Information:  Medical History[1]   Surgical History[2]  Family History[3]  Social History   Tobacco Use  . Smoking status: Former    Current packs/day: 0.50    Average packs/day: 0.5 packs/day for 4.6 years (2.3 ttl pk-yrs)    Types: Cigarettes    Start date: 02/04/2019  . Smokeless tobacco: Never  Substance Use Topics  . 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  Current Medications[4] _______________________________________________________________________________________________________  Review of Systems: Reviewed by Sherlean Jenkins New, PA-C ROS was performed with positive/negative pertinent findings listed in HPI.  _______________________________________________________________________________________________________  Physical Exam:  Vitals:   09/14/23 1448  BP: 114/78  BP Location: Left arm  Patient Position: Sitting  Pulse: 55  Resp: 16  SpO2: 93%  Weight: 66.2 kg (146 lb)  Height: 1.753 m (5' 9)   Pain Assessment Pain Score  : 4  What number best describes your pain on average in the past week?: 4 What number best describes how, during the past week, pain has interfered with your enjoyment of life?: 5 What number best describes how, during the past week, pain has interfered with your general activity?: 5 PEG Pain Total Score: 4.67  General: Constitutional: Well developed, Well nourished, No acute distress and Interactive General: Patient is alert and orientated Psychological: The patient's mood is normal, and appropriate for the circumstances Gait:   Antalgic. Uses  assist device - using a standard wheelchair as a rolling walker  _______________________________________________________________________________________________________ Monitoring/Surveillance: Opioid Agreement:  Yes - Signed on 01/27/2023  PDMP Reviewed: Reviewed  Urine Drug Screen: Most recent UDS on 01/27/2023 was reviewed and appropriate   CTE Measures: Is the patient taking prescription opioids? Yes: Prescribed by outside providers  _______________________________________________________________________________________________________ Imaging and Diagnostic Studies:  All applicable diagnostic studies related to this consultation have been reviewed. Relevant diagnostic reports and/or my personal review of imaging or other diagnostic studies listed below:   EXAM: CERVICAL SPINE - 2-3 VIEW on 11/20/2020     COMPARISON:  None.     FINDINGS:  C3-C6 anterior cervical discectomy and fusion with instrumentation  has been performed with solid incorporation of interbody bone graft.  There is complete loss of the intervertebral disc space at C6-7 with  probable ankylosis of these vertebral bodies. No acute fracture of  the cervical spine. Intervertebral disc space narrowing and endplate  remodeling at C7-T1 and C2-3 is in keeping with moderate to severe  degenerative disc disease. The prevertebral soft tissues are not  thickened. The spinal canal is widely patent.     IMPRESSION:  Probable solid fusion C3-C7. Advanced degenerative disc disease C2-3  and C7-T1. No acute fracture or listhesis.     Electronically Signed    By: Dorethia Molt M.D.    On: 11/21/2020 12:55        EXAM: MRI LUMBAR SPINE WITHOUT CONTRAST on 04/15/2021     TECHNIQUE:  Multiplanar, multisequence MR imaging of the lumbar spine was  performed. No intravenous contrast was administered.     COMPARISON:  Lumbar MRI 04/12/2020.  Lumbar radiographs 03/22/2021.     FINDINGS:  Segmentation: Normal on the  comparison radiographs which is the same  numbering system used on the MRI last year.     Alignment: Subtle grade 1 anterolisthesis at both L3-L4 and L4-L5 is  slightly more apparent since last year. Otherwise stable lumbar  lordosis.     Vertebrae: No marrow edema or evidence of acute osseous abnormality.  Normal background bone marrow signal. Intact visible sacrum and SI  joints.     Conus medullaris and cauda equina: Conus extends to the T12-L1  level. No lower spinal cord or conus signal abnormality.     Paraspinal and other soft tissues: Stable, negative.     Disc levels:     T11-T12 through L1-L2 remain normal for age.     L2-L3: Chronic disc space loss, circumferential disc bulge, mild to  moderate facet and ligament flavum hypertrophy greater on the right.  No significant spinal or lateral recess stenosis. Mild to moderate  bilateral L2 foraminal stenosis is stable.     L3-L4: Chronic disc space loss and circumferential disc bulge. Mild  to moderate facet and ligament flavum hypertrophy with resolved  facet joint fluid since last year. No significant spinal or lateral  recess stenosis. Mild to moderate left and moderate to severe right  (series 3, image 5) L3 neural foraminal stenosis has not  significantly changed.     L4-L5: Chronic circumferential disc bulge with moderate to severe  facet and ligament flavum hypertrophy. Chronic but increased  degenerative facet joint fluid with posteriorly situated synovial  cysts bilaterally the should not cause neural compromise. New edema  or fluid in the interspinous ligament here also. And new posterior  midline degenerative appearing synovial cyst near to the abnormal  interspinous ligament best seen on series 5, image 8, also series 6,  image 29) about 6 mm. Subsequently, increased spinal stenosis there  now moderate to severe  at the mid L5 vertebral level. At the disc  space level mild to moderate spinal stenosis also  appears increased.  Moderate left lateral recess stenosis appears stable (left L5 nerve  level). Moderate to severe left L4 foraminal stenosis appears  stable, as does mild right L4 foraminal stenosis.     L5-S1: Circumferential disc bulge with endplate spurring and  moderate facet hypertrophy. Regressed epidural lipomatosis and no  spinal or lateral recess stenosis here. Moderate left and moderate  to severe right L5 foraminal stenosis appears stable.     IMPRESSION:  1. Increased since last year and moderate spinal stenosis at both  the L4-L5 disc level and caudal to the disc where a new posterior  spinal canal degenerative 6 mm synovial cyst has developed in  association with chronic facet and interspinous ligament arthropathy  there (series 5, image 8). Underlying grade 1 anterolisthesis.  Moderate to severe left foraminal and moderate left lateral recess  stenosis there are stable.     2. No other spinal stenosis. Stable multifactorial moderate to  severe right L3 and bilateral L5 neural foraminal stenosis.     Electronically Signed    By: VEAR Hurst M.D.    On: 04/16/2021 11:11          EXAM: X-RAY LUMBAR SPINE (AP, LAT, FLEX/EX), 10/01/2021 1:33 PM     INDICATION: lumbar pain \ M54.16 Lumbar radiculopathy   COMPARISON: 03/22/2021 radiograph     CONCLUSION:  1.  No acute fracture.  2.  No dynamic spondylolisthesis. Trace retrolisthesis at L1-L2 and trace anterolisthesis at L3-L4 and L4-L5 do not change between neutral, flexion, and extension.  3.  No pars defects.   4.  Multilevel disc degenerative changes, moderate (posterior-eccentric) at L3-L4 and L5-S1, and mild at the other levels.  5.  Multilevel facet osteoarthritis, moderate at L4-L5 bilaterally and mild at L3-L4 and L5-S1 bilaterally.   6.  Moderate degenerative changes of the hips. Mild degenerative changes of the sacroiliac joints.  7.  Chondrocalcinosis involving the hips and pubic symphysis.  8.  Osteopenia.  9.   Arterial calcifications.                I have personally reviewed the procedure note and/or have reviewed and interpreted this image/images.     Electronically Signed By Attending: Ara Cheng, MD on 10/01/2021  4:21 PM      EXAM: MRI LUMBAR SPINE WITHOUT CONTRAST on 06/25/2022   TECHNIQUE: Multiplanar, multisequence MR imaging of the lumbar spine was performed. No intravenous contrast was administered.   COMPARISON:  Lumbar spine MRI 04/15/2021   FINDINGS: Segmentation:  Standard.   Alignment:  Unchanged trace retrolisthesis of L5 on S1.   Vertebrae: No fracture, suspicious marrow lesion, or significant marrow edema.   Conus medullaris and cauda equina: Conus extends to the L1 level. Conus and cauda equina appear normal.   Paraspinal and other soft tissues: Unremarkable.   Disc levels:   Disc desiccation and mild disc space narrowing from L2-3 through L5-S1.   T12-L1: Only imaged sagittally and negative.   L1-2: Minimal disc bulging without stenosis, unchanged.   L2-3: Disc bulging and mild facet and ligamentum flavum hypertrophy result in mild right lateral recess stenosis and mild-to-moderate bilateral neural foraminal stenosis without spinal stenosis, unchanged.   L3-4: Circumferential disc bulging eccentric to the right and moderate facet and ligamentum flavum hypertrophy result in mild bilateral lateral recess stenosis and moderate to severe right and mild-to-moderate left neural foraminal  stenosis without significant spinal stenosis, unchanged. New small right facet joint effusion.   L4-5: Circumferential disc bulging eccentric to the left and moderate facet and ligamentum flavum hypertrophy result in mild spinal stenosis, mild-to-moderate left lateral recess stenosis, and moderate right and moderate to severe left neural foraminal stenosis, overall stable to slightly improved. Persistent left larger than right facet joint effusions.   L5-S1:  Circumferential disc bulging, endplate spurring, and mild-to-moderate facet hypertrophy result in moderate to severe bilateral neural foraminal stenosis, not significantly changed. A central to right paracentral disc protrusion is also similar to the prior study and does not result in significant spinal stenosis.   IMPRESSION: 1. Diffuse lumbar disc and facet degeneration without significant interval change. 2. Mild spinal stenosis at L4-5. 3. Moderate to severe neural foraminal stenosis at L3-4, L4-5, and L5-S1.   Electronically Signed   By: Dasie Hamburg M.D.   On: 07/04/2022 13:21    _______________________________________________________________________________________________________  Relevant Labs: No results found for: CREATININE No results found for: WBC, HGB, HCT, MCV, PLT No results found for: HGBA1C  _______________________________________________________________________________________________________ Diagnosis/Impression:   Arthropathy of lumbar facet joint  Lumbar radiculopathy  Chronic buttock pain  Post-thoracotomy pain syndrome    A/P 09/14/2023: [name] is a 68 y.o. male who presents to the clinic for continued management of chronic pain.  He has a PMH significant for history of tobacco use, COPD, lung cancer, GERD, HTN, CAD, bilateral occipital neuralgia, cervicalgia, cervical facet arthropathy, cervical radiculopathy, prior C3-C6 ACDF in 11/2015, chronic low back pain, lumbar spondylosis, lumbar facet arthropathy, lumbar radiculopathy, and bilateral buttock pain.  Today, he presents to the clinic with complaints of continued and overall unchanged pain since his last office visit on 06/15/23.  At that time Laverle was discontinued after he had discontinued this medication on his own given concern for adverse effect of drowsiness.  A referral was placed to [facility] for evaluation of additional options regarding pain  management.  Today, the patient reports that he has been evaluated by [facility] and was initiated on Hydrocodone -Acetaminophen  10-325 mg.  Although he does report improvement of his symptoms with use of this medication, he is also reporting adverse effect of constipation.  He was encouraged to further discuss this with [facility] at his follow-up appointment.  In regards to his ongoing symptoms of low back pain with noted radiation to his right buttock and right hip, as well as pain in bilateral lower extremities, we discussed pursuing interventional procedures.  However, he declines pursuing interventional procedures at this time and wishes to continue with medication management of his symptoms.  Therefore, no follow-up appointment was scheduled with our practice and he will plan to follow-up as needed in the future if he wishes to pursue additional interventional procedures.  Otherwise, he will continue to follow-up with [facility] for medication management and was encouraged to follow-up with his PCP and other specialist as scheduled/needed.  Recommended Plan of Care: - Interventional Procedures: - Prior: Bilateral SI joint injections on 08/23/2020 by Dr. Rea - Prior: Bilateral SI joint injections on 10/23/2020 by Dr. Catherene  - Prior: Bilateral SI joint injections on 01/03/2021 by Dr. Catherene  - Prior: Bilateral SI joint injections on 04/02/2021 by Dr. Catherene - 80% alleviation  - Prior: Left L4-L5 and L5-S1 RFA on 07/31/2021 by Dr. Catherene - 100% alleviation  - Prior: Right L4-L5 and L5-S1 RFA on 08/21/2021 by Dr. Catherene - 0% alleviation  - Prior:  L5-S1 Interlaminar ESI on 11/11/2021 by Dr. Catherene - 50% alleviation  - Prior: Bilateral SI joint injections on 12/16/2021 by Dr. Catherene - 80% alleviation - Prior: L5-S1 Interlaminar ESI on 06/16/2022 by Dr. Bintrim - 25-50% alleviation  - Prior: Bilateral SI joint injections on 11/10/2022 by Dr. Bintrim - 70%  alleviation  - Prior: Bilateral SI joint injections on 04/10/2023 by Dr. Bintrim - 70% alleviation  - Prior: L5-S1 Interlaminar ESI on 05/27/2023 by Dr. Bintrim - 70% alleviation x1-2 days - In regards to ESI: Noted that it is likely difficult to approach TF ESI or Interlaminar at L4-L5 - Consider: PNS of lumbar region (right L4) - Patient declines interventional procedures at this time - Medical Modalities:  - Medication management per outside provider [facility]) - Imaging/Labs:  - None - Consults/Therapy:  - Continue: HEP (Physical Therapy previously completed)  - Follow-up with PCP, Neurosurgery, and Orthopedic/Spine Surgery as scheduled/needed  - Follow up: - Return if symptoms worsen or fail to improve.  Time (>30 minutes) was spent on reviewing patient PMH, medications, procedures and image, performing medically appropriate examination, and counseling and educating patient.  Treatment plan fully discussed and agreed upon with patient. All questions were answered.  Documentation on day of service.  Electronically signed by: Sherlean Jenkins New, PA-C 09/14/2023 3:33 PM       [1] Past Medical History: Diagnosis Date  . Abnormal heart rhythm   . Cancer    (CMD)   . GERD (gastroesophageal reflux disease)   . Lung cancer    (CMD)   . Migraine   . Myocardial infarction    (CMD)   . Personal history of lung cancer   [2] Past Surgical History: Procedure Laterality Date  . CERVICAL SPINE SURGERY     Procedure: CERVICAL SPINE SURGERY  . EPIDURAL BLOCK INJECTION Bilateral 06/26/2021   Procedure: LUMBAR MEDIAL BRANCH BLOCK  L4-S1 BILATERAL #1/2;  Surgeon: Toribio Fairy Catherene, MD;  Location: [facility];  Service: Pain Services;  Laterality: Bilateral;  . EPIDURAL BLOCK INJECTION Bilateral 07/12/2021   Procedure: LUMBAR MEDIAL BRANCH BLOCK  BILATERAL L4-S1 #2/2;  Surgeon: Toribio Fairy Catherene, MD;  Location: [facility];  Service: Pain Services;  Laterality:  Bilateral;  . LUNG REMOVAL, PARTIAL     Procedure: LUNG REMOVAL, PARTIAL  . RADIOFREQUENCY ABLATION NERVES Left 07/31/2021   Procedure: LUMBAR RADIOFREQUENCY LEFT L4-S1  #1/2;  Surgeon: Toribio Fairy Catherene, MD;  Location: [facility];  Service: Pain Services;  Laterality: Left;  Per Bintrim patient would like Versed   . RADIOFREQUENCY ABLATION NERVES Right 08/21/2021   Procedure: LUMBAR RADIOFREQUENCY  RIGHT L4-S1 #2/2;  Surgeon: Toribio Fairy Catherene, MD;  Location: [facility];  Service: Pain Services;  Laterality: Right;  . SACROILIAC JOINT INJECTION Bilateral 04/10/2023   INJECTION/ASPIRATION JOINT SACROILLIAC Bilateral with PO Sedation performed by Toribio Fairy Catherene, MD at [facility]  . SURGERY SCROTAL / TESTICULAR Left   . TRIGGER POINT INJECTION N/A 05/27/2023   BLOCK/INJECTION EPIDURAL/SPINAL CERVICAL, THORACIC, LUMBAR:  L5-S1 Interlaminar ESI performed by Toribio Fairy Catherene, MD at [facility]  [3] Family History Adopted: Yes  Problem Relation Name Age of Onset  . Eczema Neg Hx    . Psoriasis Neg Hx    . Stroke Neg Hx    [4] Current Outpatient Medications  Medication Sig Dispense Refill  . acetaminophen  (TYLENOL ) 500 mg tablet Take 500 mg by mouth.    . albuterol  HFA (PROVENTIL  HFA;VENTOLIN   HFA;PROAIR  HFA) 90 mcg/actuation inhaler Inhale.    . aspirin  81 mg EC tablet Take 81 mg by mouth.    SABRA atogepant 30 mg tab Take 1 tablet (30 mg total) by mouth daily. 30 tablet 2  . azithromycin  (ZITHROMAX ) 250 mg tablet Take by mouth.    . busPIRone  (BUSPAR ) 5 mg tablet Take 5 mg by mouth as needed in the morning and 5 mg as needed at noon and 5 mg as needed in the evening.    . carboxymethylcellulose (REFRESH PLUS) 0.5 % dpet 1 drop.    . cetirizine  (ZyrTEC ) 10 mg tablet     . cholecalciferol  (VITAMIN D3) 1,000 unit (25 mcg) tablet Take 1,000 Units by mouth.    . ciprofloxacin  (CIPRO ) 500 mg tablet Take 1 tablet by mouth in the morning and 1 tablet at noon.    .  dicyclomine  (BENTYL ) 10 mg capsule     . diphenoxylate -atropine  (LOMOTIL ) 2.5-0.025 mg per tablet Take 1 tablet by mouth.    . doxycycline  (VIBRAMYCIN ) 100 mg capsule     . DULoxetine  (CYMBALTA ) 30 mg capsule Take 30 mg by mouth.    . escitalopram  (LEXAPRO ) 10 mg tablet     . fexofenadine  (ALLEGRA ) 180 mg tablet Take 180 mg by mouth.    . finasteride  (PROSCAR ) 5 mg tablet Take 5 mg by mouth daily.    . fluocinolone 0.01 % oil Apply  topically 2 (two) times a day as needed. 118.28 mL 11  . fluocinolone and shower cap 0.01 % oil Apply to affected skin of scalp twice daily as needed for itching/burning. NEVER to face/neck. 118.28 mL 5  . FLUoxetine (PROzac) 10 mg capsule     . fluticasone  propionate (FLONASE ) 50 mcg/spray nasal spray 2 sprays.    . Gemtesa 75 mg tab Take 75 mg by mouth daily.    . hydrocortisone  2.5 % cream Apply to affected skin of brows daily as needed for itching. 28 g 5  . hydrOXYzine  (ATARAX ) 25 mg tablet     . ipratropium (ATROVENT ) 21 mcg (0.03 %) nasal spray     . ipratropium-albuteroL  (DUO-NEB) 0.5-2.5 mg/3 mL nebulizer solution     . isosorbide  mononitrate (IMDUR ) 60 mg 24 hr tablet     . ketoconazole  (NIZORAL ) 2 % cream Apply to affected skin of brows daily as needed for flaking. 30 g 5  . ketoconazole  (NIZORAL ) 2 % shampoo Apply 1 Application topically.    . levoFLOXacin  (LEVAQUIN ) 750 mg tablet Take 750 mg by mouth.    . Lidoderm  5 % patch     . loperamide  (IMODIUM ) 2 mg capsule Take 2 mg by mouth.    . losartan  (COZAAR ) 25 mg tablet Take 25 mg by mouth daily.    . medical supply, miscellaneous (miscellaneous medical supply) misc 2 L.    . meloxicam  (MOBIC ) 7.5 mg tablet Take 7.5 mg by mouth in the morning and 7.5 mg in the evening. Take with meals.    . mesalamine  (LIALDA ) 1.2 gram EC tablet Take by mouth.    . metoclopramide  (REGLAN ) 5 mg tablet     . Myrbetriq  50 mg Tb24 Take 1 tablet by mouth daily.    . naloxone  (NARCAN ) 4 mg/actuation spry nasal spray  Administer 1 spray into affected nostril(s) as needed (for suspected overdose. Call EMS immediately after 1st dose. May repeat in 2-3 minutes as needed.). 2 each 1  . naproxen sodium (ANAPROX) 220 mg tablet Take 220 mg by mouth.    SABRA  nitroglycerin  (NITROSTAT ) 0.4 mg SL tablet Place under the tongue.    . omeprazole  (PriLOSEC) 40 mg DR capsule Take 40 mg by mouth 2 (two) times a day before meals. 60 capsule 5  . ondansetron  (ZOFRAN ) 4 mg tablet     . ondansetron  (ZOFRAN -ODT) 4 mg disintegrating tablet     . oxybutynin (DITROPAN XL) 10 mg 24 hr tablet Take 10 mg by mouth nightly.    . pramipexole (MIRAPEX) 0.75 mg tablet Take 0.75 mg by mouth nightly. 30 tablet 5  . Restasis 0.05 % ophthalmic emulsion     . rosuvastatin  (CRESTOR ) 20 mg tablet Take 20 mg by mouth daily.    . silodosin (RAPAFLO) 8 mg cap capsule Take 1 capsule by mouth daily.    . simethicone  125 mg tab Take 125 mg by mouth.    . sucralfate  (CARAFATE ) 1 gram tablet     . tamsulosin  (FLOMAX ) 0.4 mg cap Take 0.4 mg by mouth 2 (two) times a day.    . tiotropium-olodateroL (Stiolto Respimat ) 2.5-2.5 mcg/actuation inhaler Inhale.    . Trelegy Ellipta  100-62.5-25 mcg inhaler     . Xifaxan  550 mg tablet Take 550 mg by mouth 3 (three) times a day.    . zonisamide  (ZONEGRAN ) 25 mg capsule     . pramipexole (MIRAPEX) 0.125 mg tablet Take  by mouth. 168 tablet 0   No current facility-administered medications for this visit.

## 2023-09-16 ENCOUNTER — Ambulatory Visit: Admitting: Licensed Clinical Social Worker

## 2023-09-16 DIAGNOSIS — F32A Depression, unspecified: Secondary | ICD-10-CM

## 2023-09-16 DIAGNOSIS — F419 Anxiety disorder, unspecified: Secondary | ICD-10-CM

## 2023-09-16 NOTE — BH Specialist Note (Signed)
 Integrated Behavioral Health via Telemedicine Visit  09/16/2023 [name] [id number]  Number of Integrated Behavioral Health Clinician visits: Additional Visit  Session Start time: 1430   Session End time: 1530  Total time in minutes: 60    Referring Provider: PCP Patient/Family location: Home [facility] Provider location: Office All persons participating in visit: [facility] and Patient Types of Service: Individual psychotherapy   I connected with [name]  via  Telephone Application  ( and verified that I am speaking with the correct person using two identifiers. Discussed confidentiality: Yes    I discussed the limitations of telemedicine and the availability of in person appointments.  Discussed there is a possibility of technology failure and discussed alternative modes of communication if that failure occurs.   I discussed that engaging in this telemedicine visit, they consent to the provision of behavioral healthcare and the services will be billed under their insurance.   Patient and/or legal guardian expressed understanding and consented to Telemedicine visit: Yes    Presenting Concerns: Patient and/or family reports the following symptoms/concerns:   Client participated in today's session via HIPAA-compliant telehealth platform. Client presented as calm and engaging although anxious regarding upcoming surgery on 08/29. Session was spent discussing family/ friends support and ongoing concerns regarding family.    Duration of problem: more than 12 months; Severity of problem: moderate   Patient and/or Family's Strengths/Protective Factors: Social connections   Goals Addressed: Patient will:  Reduce symptoms of: anxiety and depression   Increase knowledge and/or ability of: coping skills   Demonstrate ability to: Increase healthy adjustment to current life circumstances   Progress towards Goals: Ongoing       Interventions: Interventions utilized:  CBT  Cognitive Behavioral Therapy Standardized Assessments completed: Not Needed       Patient and/or Family Response: Patient agrees to treatment plan   Clinical Assessment/Diagnosis   Anxiety and depression      Assessment: Patient currently experiencing Anxiety and Depression.    Patient may benefit from Individual counseling.   Plan: Follow up with behavioral health clinician on : 08/28; Telehealth     I discussed the assessment and treatment plan with the patient and/or parent/guardian. They were provided an opportunity to ask questions and all were answered. They agreed with the plan and demonstrated an understanding of the instructions.   They were advised to call back or seek an in-person evaluation if the symptoms worsen or if the condition fails to improve as anticipated.   [name] Pontes, MSW, LCSW-A She/Her Behavioral Health Clinician [facility] Direct [phone]  Fax [phone]

## 2023-09-18 ENCOUNTER — Telehealth: Payer: Self-pay

## 2023-09-18 ENCOUNTER — Other Ambulatory Visit: Payer: Self-pay | Admitting: *Deleted

## 2023-09-18 ENCOUNTER — Ambulatory Visit (INDEPENDENT_AMBULATORY_CARE_PROVIDER_SITE_OTHER): Admitting: Gastroenterology

## 2023-09-18 ENCOUNTER — Telehealth: Admitting: *Deleted

## 2023-09-18 ENCOUNTER — Encounter: Payer: Self-pay | Admitting: Gastroenterology

## 2023-09-18 VITALS — BP 100/62 | HR 51 | Ht 69.0 in | Wt 141.0 lb

## 2023-09-18 DIAGNOSIS — K5903 Drug induced constipation: Secondary | ICD-10-CM | POA: Diagnosis not present

## 2023-09-18 DIAGNOSIS — R630 Anorexia: Secondary | ICD-10-CM | POA: Diagnosis not present

## 2023-09-18 DIAGNOSIS — K529 Noninfective gastroenteritis and colitis, unspecified: Secondary | ICD-10-CM | POA: Diagnosis not present

## 2023-09-18 DIAGNOSIS — T402X5A Adverse effect of other opioids, initial encounter: Secondary | ICD-10-CM

## 2023-09-18 DIAGNOSIS — R194 Change in bowel habit: Secondary | ICD-10-CM

## 2023-09-18 DIAGNOSIS — R634 Abnormal weight loss: Secondary | ICD-10-CM

## 2023-09-18 NOTE — Telephone Encounter (Signed)
 Copied from CRM #[id number]. Topic: Clinical - Order For Equipment >> Sep 18, 2023 12:03 PM [name] wrote: Reason for CRM: Patient is calling to state that his new nebulizer is not functioning either. States that [employer] has not assisted him, as he cannot get through to them. States it is not administering medicine and appears to be used. Patient requesting resolution before upcoming surgery.    [employer] is replacing machine    -NFN

## 2023-09-18 NOTE — Patient Instructions (Signed)
 Visit Information  Thank you for taking time to visit with me today. Please don't hesitate to contact me if I can be of assistance to you before our next scheduled appointment.  This contact number fore [employer] [phone] for ongoing follow up on nebulizer.  Your next care management appointment is by telephone on 10/22/2023 at 2:00 pm  Please call the care guide team at [phone] if you need to cancel, schedule, or reschedule an appointment.   Please call the Suicide and Crisis Lifeline: 988 call the [place]  National Suicide Prevention Lifeline: (954)877-1479 or TTY: 628-871-2673 TTY 973-314-3509) to talk to a trained counselor call 1-800-273-TALK (toll free, 24 hour hotline) if you are experiencing a Mental Health or Behavioral Health Crisis or need someone to talk to.   Olam Ku, RN, BSN [place]  [facility], [facility] RN Care Manager Direct Dial: [phone]  Fax: [phone]
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
## 2023-09-18 NOTE — Patient Instructions (Signed)
 Please bring stool sample ( fecal calprotectin) when able.   Trial of Movantik over the weekend. Reach our to office and let us  know if it is helpful and we need to provide prescription or is your pain management provider going to write prescription.   Follow-up on 11/06/23 at 3:00 pm with Camie Furbish, PA-C   Due to recent changes in healthcare laws, you may see the results of your imaging and laboratory studies on MyChart before your provider has had a chance to review them.  We understand that in some cases there may be results that are confusing or concerning to you. Not all laboratory results come back in the same time frame and the provider may be waiting for multiple results in order to interpret others.  Please give us  48 hours in order for your provider to thoroughly review all the results before contacting the office for clarification of your results.   _______________________________________________________  If your blood pressure at your visit was 140/90 or greater, please contact your primary care physician to follow up on this.  _______________________________________________________  If you are age 51 or older, your body mass index should be between 23-30. Your Body mass index is 20.82 kg/m. If this is out of the aforementioned range listed, please consider follow up with your Primary Care Provider.  If you are age 63 or younger, your body mass index should be between 19-25. Your Body mass index is 20.82 kg/m. If this is out of the aformentioned range listed, please consider follow up with your Primary Care Provider.   ________________________________________________________  The [facility] providers would like to encourage you to use MYCHART to communicate with providers for non-urgent requests or questions.  Due to long hold times on the telephone, sending your provider a message by [facility] may be a faster and more efficient way to get a response.  Please allow 48 business hours  for a response.  Please remember that this is for non-urgent requests.  _______________________________________________________  [facility] is using a team-based approach to care.  Your team is made up of your doctor and two to three APPS. Our APPS (Nurse Practitioners and Physician Assistants) work with your physician to ensure care continuity for you. They are fully qualified to address your health concerns and develop a treatment plan. They communicate directly with your gastroenterologist to care for you. Seeing the Advanced Practice Practitioners on your physician's team can help you by facilitating care more promptly, often allowing for earlier appointments, access to diagnostic testing, procedures, and other specialty referrals.   Thank you for choosing me and [facility].  Dr Aloha Finner

## 2023-09-19 ENCOUNTER — Encounter: Payer: Self-pay | Admitting: Gastroenterology

## 2023-09-19 DIAGNOSIS — T402X5A Adverse effect of other opioids, initial encounter: Secondary | ICD-10-CM | POA: Insufficient documentation

## 2023-09-19 DIAGNOSIS — R634 Abnormal weight loss: Secondary | ICD-10-CM | POA: Insufficient documentation

## 2023-09-19 DIAGNOSIS — R630 Anorexia: Secondary | ICD-10-CM | POA: Insufficient documentation

## 2023-09-21 NOTE — Progress Notes (Signed)
 COVID Vaccine received:  []  No [x]  Yes Date of any COVID positive Test in last 90 days: Yes- hospitalized in June with COVID  PCP - Saad Nooruddin MD Cardiologist - Gordy Bergamo MD  Chest x-ray - 07/29/23 Epic EKG -  09/03/23 EPIC Stress Test - 06/22/14 EPIC ECHO - 03/05/21 EPIC Cardiac Cath - 06/04/11 Epic  Bowel Prep - [x]  No  []   Yes ______  Pacemaker / ICD device [x]  No []  Yes   Spinal Cord Stimulator:[x]  No []  Yes       History of Sleep Apnea? [x]  No []  Yes   CPAP used?- [x]  No []  Yes    Does the patient monitor blood sugar?          [x]  No []  Yes  []  N/A  Patient has: [x]  NO Hx DM   []  Pre-DM                 []  DM1  []   DM2 Does patient have a [employer] or Dexacom? [x]  No []  Yes   Fasting Blood Sugar Ranges- 0 Checks Blood Sugar ___0__ times a day  GLP1 agonist / usual dose - no GLP1 instructions:  SGLT-2 inhibitors / usual dose - no SGLT-2 instructions:   Blood Thinner / Instructions:no Aspirin  Instructions:no  Comments:   Activity level: Patient is able  to climb a flight of stairs without difficulty; [x]  No CP  []  No SOB, but would have _SOB__   Patient can perform ADLs without assistance.   Anesthesia review: CAD, MI,COPD, on 2L O2, Lung lobectomy for CA, HTN  Patient denies shortness of breath, fever, cough and chest pain at PAT appointment.  Patient verbalized understanding and agreement to the Pre-Surgical Instructions that were given to them at this PAT appointment. Patient was also educated of the need to review these PAT instructions again prior to his/her surgery.I reviewed the appropriate phone numbers to call if they have any and questions or concerns.

## 2023-09-21 NOTE — Patient Instructions (Signed)
 SURGICAL WAITING ROOM VISITATION  Patients having surgery or a procedure may have no more than 2 support people in the waiting area - these visitors may rotate.    Children under the age of 32 must have an adult with them who is not the patient.  Visitors with respiratory illnesses are discouraged from visiting and should remain at home.  If the patient needs to stay at the hospital during part of their recovery, the visitor guidelines for inpatient rooms apply. Pre-op nurse will coordinate an appropriate time for 1 support person to accompany patient in pre-op.  This support person may not rotate.    Please refer to the [facility] website for the visitor guidelines for Inpatients (after your surgery is over and you are in a regular room).       Your procedure is scheduled on: 10/02/23   Report to [facility] Main Entrance    Report to admitting at 7:15 AM   Call this number if you have problems the morning of surgery [phone]   Do not eat food :After Midnight.   After Midnight you may have the following liquids until 6:30 AM  DAY OF SURGERY  Water  Non-Citrus Juices (without pulp, NO RED-Apple, White grape, White cranberry) Black Coffee (NO MILK/CREAM OR CREAMERS, sugar ok)  Clear Tea (NO MILK/CREAM OR CREAMERS, sugar ok) regular and decaf                             Plain Jell-O (NO RED)                                           Fruit ices (not with fruit pulp, NO RED)                                     Popsicles (NO RED)                                                               Sports drinks like Gatorade (NO RED)                   Oral Hygiene is also important to reduce your risk of infection.                                    Remember - BRUSH YOUR TEETH THE MORNING OF SURGERY WITH YOUR REGULAR TOOTHPASTE  DENTURES WILL BE REMOVED PRIOR TO SURGERY PLEASE DO NOT APPLY Poly grip OR ADHESIVES!!!   Stop all vitamins and herbal supplements 7 days before  surgery.   Take these medicines the morning of surgery with A SIP OF WATER : escitaprolam(lexapro ), Flonase  nasal spray,  Hydroxyzine (Atarax ), Movantik, Rosuvastatin , inhaler                           Do not take Losartan  the morning of surgery.  DO NOT TAKE ANY ORAL DIABETIC MEDICATIONS DAY OF YOUR SURGERY  You may not have any metal on your body including hair pins, jewelry, and body piercing             Do not wear make-up, lotions, powders, perfumes/cologne, or deodorant  Men may shave face and neck.   Do not bring valuables to the hospital. [facility] IS NOT             RESPONSIBLE   FOR VALUABLES.   Contacts, glasses, dentures or bridgework may not be worn into surgery.  DO NOT BRING YOUR HOME MEDICATIONS TO THE HOSPITAL. PHARMACY WILL DISPENSE MEDICATIONS LISTED ON YOUR MEDICATION LIST TO YOU DURING YOUR ADMISSION IN THE HOSPITAL!    Patients discharged on the day of surgery will not be allowed to drive home.  Someone NEEDS to stay with you for the first 24 hours after anesthesia.   Special Instructions: Bring a copy of your healthcare power of attorney and living will documents the day of surgery if you haven't scanned them before.              Please read over the following fact sheets you were given: IF YOU HAVE QUESTIONS ABOUT YOUR PRE-OP INSTRUCTIONS PLEASE CALL [phone] [place]   If you received a COVID test during your pre-op visit  it is requested that you wear a mask when out in public, stay away from anyone that may not be feeling well and notify your surgeon if you develop symptoms. If you test positive for Covid or have been in contact with anyone that has tested positive in the last 10 days please notify you surgeon.    Incentive Spirometer (Watch this video at home: [email or web address])  An incentive spirometer is a tool that can help keep your lungs clear and active. This tool measures how well you are filling your lungs with  each breath. Taking long deep breaths may help reverse or decrease the chance of developing breathing (pulmonary) problems (especially infection) following: A long period of time when you are unable to move or be active. BEFORE THE PROCEDURE  If the spirometer includes an indicator to show your best effort, your nurse or respiratory therapist will set it to a desired goal. If possible, sit up straight or lean slightly forward. Try not to slouch. Hold the incentive spirometer in an upright position. INSTRUCTIONS FOR USE  Sit on the edge of your bed if possible, or sit up as far as you can in bed or on a chair. Hold the incentive spirometer in an upright position. Breathe out normally. Place the mouthpiece in your mouth and seal your lips tightly around it. Breathe in slowly and as deeply as possible, raising the piston or the ball toward the top of the column. Hold your breath for 3-5 seconds or for as long as possible. Allow the piston or ball to fall to the bottom of the column. Remove the mouthpiece from your mouth and breathe out normally. Rest for a few seconds and repeat Steps 1 through 7 at least 10 times every 1-2 hours when you are awake. Take your time and take a few normal breaths between deep breaths. The spirometer may include an indicator to show your best effort. Use the indicator as a goal to work toward during each repetition. After each set of 10 deep breaths, practice coughing to be sure your lungs are clear. If you have an incision (the cut made at the time of surgery), support your incision when coughing by placing a pillow or  rolled up towels firmly against it. Once you are able to get out of bed, walk around indoors and cough well. You may stop using the incentive spirometer when instructed by your caregiver.  RISKS AND COMPLICATIONS Take your time so you do not get dizzy or light-headed. If you are in pain, you may need to take or ask for pain medication before doing  incentive spirometry. It is harder to take a deep breath if you are having pain. AFTER USE Rest and breathe slowly and easily. It can be helpful to keep track of a log of your progress. Your caregiver can provide you with a simple table to help with this. If you are using the spirometer at home, follow these instructions: SEEK MEDICAL CARE IF:  You are having difficultly using the spirometer. You have trouble using the spirometer as often as instructed. Your pain medication is not giving enough relief while using the spirometer. You develop fever of 100.5 F (38.1 C) or higher. SEEK IMMEDIATE MEDICAL CARE IF:  You cough up bloody sputum that had not been present before. You develop fever of 102 F (38.9 C) or greater. You develop worsening pain at or near the incision site. MAKE SURE YOU:  Understand these instructions. Will watch your condition. Will get help right away if you are not doing well or get worse. Document Released: 06/02/2006 Document Revised: 04/14/2011 Document Reviewed: 08/03/2006 [facility] Patient Information 2014 [place], [place].

## 2023-09-22 ENCOUNTER — Encounter (HOSPITAL_COMMUNITY)
Admission: RE | Admit: 2023-09-22 | Discharge: 2023-09-22 | Disposition: A | Discharge status: Home / Self Care | Source: Ambulatory Visit | Attending: Surgery | Admitting: Surgery

## 2023-09-22 ENCOUNTER — Encounter (HOSPITAL_COMMUNITY): Payer: Self-pay

## 2023-09-22 ENCOUNTER — Other Ambulatory Visit: Payer: Self-pay

## 2023-09-22 VITALS — BP 134/85 | HR 56 | Temp 98.1°F | Resp 18 | Ht 69.0 in | Wt 147.0 lb

## 2023-09-22 DIAGNOSIS — G894 Chronic pain syndrome: Secondary | ICD-10-CM | POA: Diagnosis not present

## 2023-09-22 DIAGNOSIS — K429 Umbilical hernia without obstruction or gangrene: Secondary | ICD-10-CM | POA: Diagnosis not present

## 2023-09-22 DIAGNOSIS — Z01812 Encounter for preprocedural laboratory examination: Secondary | ICD-10-CM | POA: Insufficient documentation

## 2023-09-22 DIAGNOSIS — J439 Emphysema, unspecified: Secondary | ICD-10-CM | POA: Diagnosis not present

## 2023-09-22 DIAGNOSIS — K4090 Unilateral inguinal hernia, without obstruction or gangrene, not specified as recurrent: Secondary | ICD-10-CM | POA: Diagnosis not present

## 2023-09-22 DIAGNOSIS — Z9981 Dependence on supplemental oxygen: Secondary | ICD-10-CM | POA: Insufficient documentation

## 2023-09-22 DIAGNOSIS — I10 Essential (primary) hypertension: Secondary | ICD-10-CM | POA: Insufficient documentation

## 2023-09-22 DIAGNOSIS — Z87891 Personal history of nicotine dependence: Secondary | ICD-10-CM | POA: Insufficient documentation

## 2023-09-22 DIAGNOSIS — J9610 Chronic respiratory failure, unspecified whether with hypoxia or hypercapnia: Secondary | ICD-10-CM | POA: Diagnosis not present

## 2023-09-22 DIAGNOSIS — Z01818 Encounter for other preprocedural examination: Secondary | ICD-10-CM

## 2023-09-22 DIAGNOSIS — K219 Gastro-esophageal reflux disease without esophagitis: Secondary | ICD-10-CM | POA: Diagnosis not present

## 2023-09-22 DIAGNOSIS — Z8616 Personal history of COVID-19: Secondary | ICD-10-CM | POA: Insufficient documentation

## 2023-09-22 DIAGNOSIS — I2510 Atherosclerotic heart disease of native coronary artery without angina pectoris: Secondary | ICD-10-CM | POA: Diagnosis not present

## 2023-09-22 HISTORY — DX: Dyspnea, unspecified: R06.00

## 2023-09-22 LAB — CBC
HCT: 40.7 % (ref 39.0–52.0)
Hemoglobin: 12.6 g/dL — ABNORMAL LOW (ref 13.0–17.0)
MCH: 29.2 pg (ref 26.0–34.0)
MCHC: 31 g/dL (ref 30.0–36.0)
MCV: 94.4 fL (ref 80.0–100.0)
Platelets: 310 K/uL (ref 150–400)
RBC: 4.31 MIL/uL (ref 4.22–5.81)
RDW: 15.3 % (ref 11.5–15.5)
WBC: 8.9 K/uL (ref 4.0–10.5)
nRBC: 0 % (ref 0.0–0.2)

## 2023-09-22 LAB — BASIC METABOLIC PANEL WITH GFR
Anion gap: 9 (ref 5–15)
BUN: 9 mg/dL (ref 8–23)
CO2: 25 mmol/L (ref 22–32)
Calcium: 9.2 mg/dL (ref 8.9–10.3)
Chloride: 104 mmol/L (ref 98–111)
Creatinine, Ser: 0.9 mg/dL (ref 0.61–1.24)
GFR, Estimated: >60 mL/min (ref >60)
Glucose, Bld: 87 mg/dL (ref 70–99)
Potassium: 3.7 mmol/L (ref 3.5–5.1)
Sodium: 138 mmol/L (ref 135–145)

## 2023-09-24 ENCOUNTER — Encounter (HOSPITAL_COMMUNITY): Payer: Self-pay
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
## 2023-09-29 ENCOUNTER — Other Ambulatory Visit: Payer: Self-pay | Admitting: *Deleted

## 2023-09-30 MED ORDER — LIDOCAINE 5 % EX PTCH: MEDICATED_PATCH | CUTANEOUS | 5 refills | Status: AC

## 2023-10-01 ENCOUNTER — Ambulatory Visit (INDEPENDENT_AMBULATORY_CARE_PROVIDER_SITE_OTHER): Admitting: Licensed Clinical Social Worker

## 2023-10-01 DIAGNOSIS — F419 Anxiety disorder, unspecified: Secondary | ICD-10-CM

## 2023-10-01 DIAGNOSIS — F32A Depression, unspecified: Secondary | ICD-10-CM

## 2023-10-01 NOTE — BH Specialist Note (Deleted)
 Patient no-showed today's appointment; appointment was for Telephone visit at 1:30pm  Behavioral Health Clinician [facility] from here on out)  attempted patient  via Telephone . [facility] left a  VM.   Patient has upcoming surgery and will reach out to [facility] to reschedule.   [name] Pontes, MSW, LCSW-A She/Her Behavioral Health Clinician [facility]  Internal Medicine Center Direct Dial:[phone]  Fax [phone] Main Office Phone: [phone] [address] [place] [place], [place] [address] Website: [facility] Internal Medicine [facility]  [place], [place]  [place]

## 2023-10-01 NOTE — BH Specialist Note (Signed)
 Integrated Behavioral Health via Telemedicine Visit  10/01/2023 [name] [id number]  Number of Integrated Behavioral Health Clinician visits: Additional Visit  Session Start time: 1335   Session End time: 1350  Total time in minutes: 15    Referring Provider: PCP Patient/Family location: Home [facility] Provider location: Office All persons participating in visit: [facility] and Patient Types of Service: General Behavioral Integrated Care (BHI)  I connected with [name] a via  Telephone and verified that I am speaking with the correct person using two identifiers. Discussed confidentiality: Yes   I discussed the limitations of telemedicine and the availability of in person appointments.  Discussed there is a possibility of technology failure and discussed alternative modes of communication if that failure occurs.  I discussed that engaging in this telemedicine visit, they consent to the provision of behavioral healthcare and the services will be billed under their insurance.  Patient and/or legal guardian expressed understanding and consented to Telemedicine visit: Yes   Presenting Concerns: Patient and/or family reports the following symptoms/concerns: Anxiety regarding upcoming surgery on 10/02/2023. Duration of problem: Ongoing; Severity of problem: moderate  Patient and/or Family's Strengths/Protective Factors: Sense of purpose  Goals Addressed: Patient will:  Reduce symptoms of: anxiety   Progress towards Goals: Ongoing    Interventions: Interventions utilized:  Mindfulness or Relaxation Training Standardized Assessments completed: Patient declined screening    Patient and/or Family Response: Patient reported benefiting from session on today. Patient reported a decrease in anxiety.   Clinical Assessment/Diagnosis  Anxiety and depression    Assessment: Patient currently experiencing Anxiety regarding upcoming surgery.   Patient may benefit from  follow up counseling with [facility]  post surgery.  Plan: Follow up with behavioral health clinician on : Patient has upcoming surgery on 08/29. Patient will contact [facility] within two weeks if feeling better. [facility] will also attempt contact in two weeks.    I discussed the assessment and treatment plan with the patient and/or parent/guardian. They were provided an opportunity to ask questions and all were answered. They agreed with the plan and demonstrated an understanding of the instructions.   They were advised to call back or seek an in-person evaluation if the symptoms worsen or if the condition fails to improve as anticipated.  [name] Pontes, MSW, LCSW-A She/Her Behavioral Health Clinician [facility]  Internal Medicine Center

## 2023-10-02 ENCOUNTER — Inpatient Hospital Stay (HOSPITAL_COMMUNITY)
Admission: AD | Admit: 2023-10-02 | Discharge: 2023-10-05 | DRG: 351 | Disposition: A | Discharge status: Home Health | Attending: Surgery | Admitting: Surgery

## 2023-10-02 ENCOUNTER — Encounter (HOSPITAL_COMMUNITY)
Admission: AD | Disposition: A | Discharge status: Home Health | Payer: Self-pay | Source: Home / Self Care | Attending: Surgery

## 2023-10-02 ENCOUNTER — Ambulatory Visit (HOSPITAL_COMMUNITY): Admitting: Anesthesiology

## 2023-10-02 ENCOUNTER — Other Ambulatory Visit: Payer: Self-pay

## 2023-10-02 ENCOUNTER — Encounter (HOSPITAL_COMMUNITY): Payer: Self-pay | Admitting: Surgery

## 2023-10-02 ENCOUNTER — Ambulatory Visit (HOSPITAL_COMMUNITY): Payer: Self-pay | Admitting: Physician Assistant

## 2023-10-02 DIAGNOSIS — Z9981 Dependence on supplemental oxygen: Secondary | ICD-10-CM

## 2023-10-02 DIAGNOSIS — Z7951 Long term (current) use of inhaled steroids: Secondary | ICD-10-CM

## 2023-10-02 DIAGNOSIS — Z888 Allergy status to other drugs, medicaments and biological substances status: Secondary | ICD-10-CM

## 2023-10-02 DIAGNOSIS — Z8616 Personal history of COVID-19: Secondary | ICD-10-CM

## 2023-10-02 DIAGNOSIS — I252 Old myocardial infarction: Secondary | ICD-10-CM

## 2023-10-02 DIAGNOSIS — K4090 Unilateral inguinal hernia, without obstruction or gangrene, not specified as recurrent: Secondary | ICD-10-CM | POA: Diagnosis not present

## 2023-10-02 DIAGNOSIS — J439 Emphysema, unspecified: Secondary | ICD-10-CM

## 2023-10-02 DIAGNOSIS — Z981 Arthrodesis status: Secondary | ICD-10-CM

## 2023-10-02 DIAGNOSIS — I2510 Atherosclerotic heart disease of native coronary artery without angina pectoris: Secondary | ICD-10-CM

## 2023-10-02 DIAGNOSIS — F419 Anxiety disorder, unspecified: Secondary | ICD-10-CM | POA: Diagnosis present

## 2023-10-02 DIAGNOSIS — K429 Umbilical hernia without obstruction or gangrene: Principal | ICD-10-CM | POA: Diagnosis present

## 2023-10-02 DIAGNOSIS — E785 Hyperlipidemia, unspecified: Secondary | ICD-10-CM | POA: Diagnosis present

## 2023-10-02 DIAGNOSIS — J9611 Chronic respiratory failure with hypoxia: Secondary | ICD-10-CM | POA: Diagnosis present

## 2023-10-02 DIAGNOSIS — Z85118 Personal history of other malignant neoplasm of bronchus and lung: Secondary | ICD-10-CM

## 2023-10-02 DIAGNOSIS — Z85528 Personal history of other malignant neoplasm of kidney: Secondary | ICD-10-CM

## 2023-10-02 DIAGNOSIS — F32A Depression, unspecified: Secondary | ICD-10-CM | POA: Diagnosis present

## 2023-10-02 DIAGNOSIS — Z905 Acquired absence of kidney: Secondary | ICD-10-CM

## 2023-10-02 DIAGNOSIS — Z79899 Other long term (current) drug therapy: Secondary | ICD-10-CM

## 2023-10-02 DIAGNOSIS — Z860100 Personal history of colon polyps, unspecified: Secondary | ICD-10-CM

## 2023-10-02 DIAGNOSIS — K219 Gastro-esophageal reflux disease without esophagitis: Secondary | ICD-10-CM | POA: Diagnosis present

## 2023-10-02 DIAGNOSIS — Z87891 Personal history of nicotine dependence: Secondary | ICD-10-CM

## 2023-10-02 HISTORY — PX: UMBILICAL HERNIA REPAIR: SHX196

## 2023-10-02 HISTORY — PX: INGUINAL HERNIA REPAIR: SHX194

## 2023-10-02 LAB — CBC
HCT: 46.9 % (ref 39.0–52.0)
Hemoglobin: 14.1 g/dL (ref 13.0–17.0)
MCH: 29.5 pg (ref 26.0–34.0)
MCHC: 30.1 g/dL (ref 30.0–36.0)
MCV: 98.1 fL (ref 80.0–100.0)
Platelets: 324 K/uL (ref 150–400)
RBC: 4.78 MIL/uL (ref 4.22–5.81)
RDW: 15.3 % (ref 11.5–15.5)
WBC: 10.5 K/uL (ref 4.0–10.5)
nRBC: 0 % (ref 0.0–0.2)

## 2023-10-02 LAB — CREATININE, SERUM
Creatinine, Ser: 1.18 mg/dL (ref 0.61–1.24)
GFR, Estimated: >60 mL/min (ref >60)

## 2023-10-02 SURGERY — REPAIR, HERNIA, INGUINAL, LAPAROSCOPIC
Anesthesia: General | Laterality: Right

## 2023-10-02 MED ORDER — SUGAMMADEX SODIUM 200 MG/2ML IV SOLN
INTRAVENOUS | Status: DC | PRN
  Administered 2023-10-02: 200 mg via INTRAVENOUS

## 2023-10-02 MED ORDER — HYDROXYZINE HCL 25 MG PO TABS
25.0000 mg | ORAL_TABLET | Freq: Four times a day (QID) | ORAL | Status: DC | PRN
  Administered 2023-10-03 (×2): 25 mg via ORAL
  Filled 2023-10-02 (×2): qty 1

## 2023-10-02 MED ORDER — CHLORHEXIDINE GLUCONATE 0.12 % MT SOLN
15.0000 mL | Freq: Once | OROMUCOSAL | Status: AC
  Administered 2023-10-02: 15 mL via OROMUCOSAL

## 2023-10-02 MED ORDER — IPRATROPIUM BROMIDE 0.03 % NA SOLN
2.0000 | Freq: Two times a day (BID) | NASAL | Status: DC
  Administered 2023-10-03 – 2023-10-05 (×3): 2 via NASAL
  Filled 2023-10-02: qty 30

## 2023-10-02 MED ORDER — ATOGEPANT 30 MG PO TABS: 30.0000 mg | ORAL_TABLET | Freq: Every day | ORAL | Status: DC

## 2023-10-02 MED ORDER — OXYCODONE HCL 5 MG PO TABS
5.0000 mg | ORAL_TABLET | ORAL | Status: DC | PRN
  Administered 2023-10-02 – 2023-10-03 (×3): 5 mg via ORAL
  Filled 2023-10-02 (×2): qty 1

## 2023-10-02 MED ORDER — CALCIUM CHLORIDE 10 % IV SOLN
INTRAVENOUS | Status: DC | PRN
Start: 2023-10-02 — End: 2023-10-02
  Administered 2023-10-02: 250 mg via INTRAVENOUS

## 2023-10-02 MED ORDER — PHENYLEPHRINE 80 MCG/ML (10ML) SYRINGE FOR IV PUSH (FOR BLOOD PRESSURE SUPPORT)
PREFILLED_SYRINGE | INTRAVENOUS | Status: AC
  Filled 2023-10-02: qty 10

## 2023-10-02 MED ORDER — LACTATED RINGERS IV SOLN: INTRAVENOUS | Status: DC

## 2023-10-02 MED ORDER — FENTANYL CITRATE (PF) 250 MCG/5ML IJ SOLN
INTRAMUSCULAR | Status: DC | PRN
  Administered 2023-10-02 (×4): 50 ug via INTRAVENOUS

## 2023-10-02 MED ORDER — PANTOPRAZOLE SODIUM 40 MG PO TBEC
40.0000 mg | DELAYED_RELEASE_TABLET | Freq: Every day | ORAL | Status: DC
  Administered 2023-10-03 – 2023-10-05 (×3): 40 mg via ORAL
  Filled 2023-10-02 (×3): qty 1

## 2023-10-02 MED ORDER — CEFAZOLIN SODIUM-DEXTROSE 2-4 GM/100ML-% IV SOLN
2.0000 g | INTRAVENOUS | Status: AC
  Administered 2023-10-02: 2 g via INTRAVENOUS
  Filled 2023-10-02: qty 100

## 2023-10-02 MED ORDER — LOSARTAN POTASSIUM 25 MG PO TABS
12.5000 mg | ORAL_TABLET | Freq: Every day | ORAL | Status: DC
  Administered 2023-10-03 – 2023-10-05 (×3): 12.5 mg via ORAL
  Filled 2023-10-02 (×3): qty 1

## 2023-10-02 MED ORDER — LIDOCAINE HCL (PF) 2 % IJ SOLN
INTRAMUSCULAR | Status: AC
  Filled 2023-10-02: qty 5

## 2023-10-02 MED ORDER — ORAL CARE MOUTH RINSE: 15.0000 mL | Freq: Once | OROMUCOSAL | Status: AC

## 2023-10-02 MED ORDER — SUGAMMADEX SODIUM 200 MG/2ML IV SOLN
INTRAVENOUS | Status: AC
  Filled 2023-10-02: qty 2

## 2023-10-02 MED ORDER — PROPOFOL 10 MG/ML IV BOLUS
INTRAVENOUS | Status: AC
  Filled 2023-10-02: qty 20

## 2023-10-02 MED ORDER — BUPIVACAINE-EPINEPHRINE (PF) 0.25% -1:200000 IJ SOLN
INTRAMUSCULAR | Status: AC
  Filled 2023-10-02: qty 30

## 2023-10-02 MED ORDER — ROCURONIUM 10MG/ML (10ML) SYRINGE FOR MEDFUSION PUMP - OPTIME
INTRAVENOUS | Status: DC | PRN
  Administered 2023-10-02: 60 mg via INTRAVENOUS

## 2023-10-02 MED ORDER — MESALAMINE 1.2 G PO TBEC
4.8000 g | DELAYED_RELEASE_TABLET | Freq: Every day | ORAL | Status: DC
  Administered 2023-10-02 – 2023-10-05 (×4): 4.8 g via ORAL
  Filled 2023-10-02 (×4): qty 4

## 2023-10-02 MED ORDER — CHLORHEXIDINE GLUCONATE CLOTH 2 % EX PADS: 6.0000 | MEDICATED_PAD | Freq: Once | CUTANEOUS | Status: DC

## 2023-10-02 MED ORDER — ROCURONIUM BROMIDE 10 MG/ML (PF) SYRINGE
PREFILLED_SYRINGE | INTRAVENOUS | Status: AC
  Filled 2023-10-02: qty 10

## 2023-10-02 MED ORDER — FENTANYL CITRATE (PF) 250 MCG/5ML IJ SOLN
INTRAMUSCULAR | Status: AC
  Filled 2023-10-02: qty 5

## 2023-10-02 MED ORDER — DEXAMETHASONE SODIUM PHOSPHATE 10 MG/ML IJ SOLN
INTRAMUSCULAR | Status: AC
  Filled 2023-10-02: qty 1

## 2023-10-02 MED ORDER — ACETAMINOPHEN 325 MG PO TABS
650.0000 mg | ORAL_TABLET | Freq: Four times a day (QID) | ORAL | Status: DC
  Administered 2023-10-02 – 2023-10-05 (×8): 650 mg via ORAL
  Filled 2023-10-02 (×10): qty 2

## 2023-10-02 MED ORDER — OXYCODONE HCL 5 MG PO TABS
ORAL_TABLET | ORAL | Status: AC
  Filled 2023-10-02: qty 1

## 2023-10-02 MED ORDER — ENOXAPARIN SODIUM 40 MG/0.4ML IJ SOSY
40.0000 mg | PREFILLED_SYRINGE | INTRAMUSCULAR | Status: DC
  Filled 2023-10-02 (×3): qty 0.4

## 2023-10-02 MED ORDER — METOCLOPRAMIDE HCL 5 MG PO TABS
5.0000 mg | ORAL_TABLET | Freq: Three times a day (TID) | ORAL | Status: DC | PRN
  Administered 2023-10-02: 5 mg via ORAL
  Filled 2023-10-02: qty 1

## 2023-10-02 MED ORDER — ESCITALOPRAM OXALATE 20 MG PO TABS
10.0000 mg | ORAL_TABLET | Freq: Every day | ORAL | Status: DC
  Administered 2023-10-02 – 2023-10-05 (×4): 10 mg via ORAL
  Filled 2023-10-02 (×4): qty 1

## 2023-10-02 MED ORDER — ONDANSETRON HCL 4 MG/2ML IJ SOLN
INTRAMUSCULAR | Status: DC | PRN
  Administered 2023-10-02: 4 mg via INTRAVENOUS

## 2023-10-02 MED ORDER — NALOXEGOL OXALATE 25 MG PO TABS
25.0000 mg | ORAL_TABLET | Freq: Every day | ORAL | Status: DC
  Administered 2023-10-02 – 2023-10-05 (×4): 25 mg via ORAL
  Filled 2023-10-02 (×4): qty 1

## 2023-10-02 MED ORDER — MIDAZOLAM HCL 2 MG/2ML IJ SOLN
INTRAMUSCULAR | Status: AC
  Filled 2023-10-02: qty 2

## 2023-10-02 MED ORDER — ONDANSETRON HCL 4 MG/2ML IJ SOLN: 4.0000 mg | Freq: Once | INTRAMUSCULAR | Status: DC | PRN

## 2023-10-02 MED ORDER — PHENYLEPHRINE HCL (PRESSORS) 10 MG/ML IV SOLN
INTRAVENOUS | Status: AC
  Filled 2023-10-02: qty 1

## 2023-10-02 MED ORDER — BUDESON-GLYCOPYRROL-FORMOTEROL 160-9-4.8 MCG/ACT IN AERO
2.0000 | INHALATION_SPRAY | Freq: Two times a day (BID) | RESPIRATORY_TRACT | Status: DC
  Administered 2023-10-02 – 2023-10-04 (×4): 2 via RESPIRATORY_TRACT
  Filled 2023-10-02: qty 5.9

## 2023-10-02 MED ORDER — ONDANSETRON HCL 4 MG/2ML IJ SOLN
INTRAMUSCULAR | Status: AC
  Filled 2023-10-02: qty 2

## 2023-10-02 MED ORDER — ROSUVASTATIN CALCIUM 20 MG PO TABS
20.0000 mg | ORAL_TABLET | Freq: Every day | ORAL | Status: DC
  Administered 2023-10-02 – 2023-10-05 (×4): 20 mg via ORAL
  Filled 2023-10-02 (×4): qty 1

## 2023-10-02 MED ORDER — OXYCODONE HCL 5 MG PO TABS
10.0000 mg | ORAL_TABLET | ORAL | Status: DC | PRN
  Administered 2023-10-03: 10 mg via ORAL
  Filled 2023-10-02 (×2): qty 2

## 2023-10-02 MED ORDER — LIDOCAINE 2% (20 MG/ML) 5 ML SYRINGE
INTRAMUSCULAR | Status: DC | PRN
  Administered 2023-10-02: 80 mg via INTRAVENOUS

## 2023-10-02 MED ORDER — BUPIVACAINE-EPINEPHRINE (PF) 0.25% -1:200000 IJ SOLN
INTRAMUSCULAR | Status: DC | PRN
  Administered 2023-10-02: 30 mL via PERINEURAL

## 2023-10-02 MED ORDER — MIDAZOLAM HCL 2 MG/2ML IJ SOLN
INTRAMUSCULAR | Status: DC | PRN
  Administered 2023-10-02 (×2): 1 mg via INTRAVENOUS

## 2023-10-02 MED ORDER — FENTANYL CITRATE PF 50 MCG/ML IJ SOSY
PREFILLED_SYRINGE | INTRAMUSCULAR | Status: AC
Start: 2023-10-02 — End: 2023-10-02
  Filled 2023-10-02: qty 1

## 2023-10-02 MED ORDER — PHENYLEPHRINE HCL-NACL 20-0.9 MG/250ML-% IV SOLN
INTRAVENOUS | Status: DC | PRN
  Administered 2023-10-02: 50 ug/min via INTRAVENOUS

## 2023-10-02 MED ORDER — PHENYLEPHRINE HCL (PRESSORS) 10 MG/ML IV SOLN
INTRAVENOUS | Status: DC | PRN
  Administered 2023-10-02 (×2): 160 ug via INTRAVENOUS
  Administered 2023-10-02: 240 ug via INTRAVENOUS

## 2023-10-02 MED ORDER — DEXAMETHASONE SODIUM PHOSPHATE 10 MG/ML IJ SOLN
INTRAMUSCULAR | Status: DC | PRN
  Administered 2023-10-02: 10 mg via INTRAVENOUS

## 2023-10-02 MED ORDER — OXYCODONE-ACETAMINOPHEN 5-325 MG PO TABS: 1.0000 | ORAL_TABLET | ORAL | 0 refills | Status: AC | PRN

## 2023-10-02 MED ORDER — OXYCODONE HCL 5 MG PO TABS
5.0000 mg | ORAL_TABLET | Freq: Once | ORAL | Status: AC | PRN
  Administered 2023-10-02: 5 mg via ORAL

## 2023-10-02 MED ORDER — FLUTICASONE PROPIONATE 50 MCG/ACT NA SUSP
2.0000 | Freq: Every day | NASAL | Status: DC
  Administered 2023-10-03 – 2023-10-05 (×2): 2 via NASAL
  Filled 2023-10-02: qty 16

## 2023-10-02 MED ORDER — PROPOFOL 10 MG/ML IV BOLUS
INTRAVENOUS | Status: DC | PRN
Start: 2023-10-02 — End: 2023-10-02
  Administered 2023-10-02: 130 mg via INTRAVENOUS

## 2023-10-02 MED ORDER — ALBUTEROL SULFATE (2.5 MG/3ML) 0.083% IN NEBU: 3.0000 mL | INHALATION_SOLUTION | RESPIRATORY_TRACT | Status: DC | PRN

## 2023-10-02 MED ORDER — DOCUSATE SODIUM 100 MG PO CAPS
100.0000 mg | ORAL_CAPSULE | Freq: Two times a day (BID) | ORAL | Status: DC
  Administered 2023-10-02 – 2023-10-05 (×7): 100 mg via ORAL
  Filled 2023-10-02 (×7): qty 1

## 2023-10-02 MED ORDER — FENTANYL CITRATE PF 50 MCG/ML IJ SOSY
25.0000 ug | PREFILLED_SYRINGE | INTRAMUSCULAR | Status: DC | PRN
  Administered 2023-10-02: 25 ug via INTRAVENOUS

## 2023-10-02 MED ORDER — ACETAMINOPHEN 500 MG PO TABS
1000.0000 mg | ORAL_TABLET | ORAL | Status: AC
  Administered 2023-10-02: 1000 mg via ORAL
  Filled 2023-10-02: qty 2

## 2023-10-02 SURGICAL SUPPLY — 31 items
BAG COUNTER SPONGE SURGICOUNT (BAG) ×2 IMPLANT
CABLE HIGH FREQUENCY MONO STRZ (ELECTRODE) ×2 IMPLANT
CHLORAPREP W/TINT 26 (MISCELLANEOUS) ×2 IMPLANT
COVER SURGICAL LIGHT HANDLE (MISCELLANEOUS) ×2 IMPLANT
DERMABOND ADVANCED .7 DNX12 (GAUZE/BANDAGES/DRESSINGS) ×2 IMPLANT
ELECT REM PT RETURN 15FT ADLT (MISCELLANEOUS) ×2 IMPLANT
GLOVE BIO SURGEON STRL SZ7.5 (GLOVE) ×2 IMPLANT
GLOVE INDICATOR 8.0 STRL GRN (GLOVE) ×2 IMPLANT
GOWN STRL REUS W/ TWL XL LVL3 (GOWN DISPOSABLE) ×2 IMPLANT
GRASPER SUT TROCAR 14GX15 (MISCELLANEOUS) ×2 IMPLANT
IRRIGATION SUCT STRKRFLW 2 WTP (MISCELLANEOUS) IMPLANT
KIT BASIN OR (CUSTOM PROCEDURE TRAY) ×2 IMPLANT
KIT TURNOVER KIT A (KITS) ×2 IMPLANT
MARKER SKIN DUAL TIP RULER LAB (MISCELLANEOUS) ×2 IMPLANT
MESH 3DMAX 5X7 RT XLRG (Mesh General) IMPLANT
NDL INSUFFLATION 14GA 120MM (NEEDLE) ×2 IMPLANT
NEEDLE INSUFFLATION 14GA 120MM (NEEDLE) ×2 IMPLANT
RELOAD STAPLE 4.0 BLU F/HERNIA (INSTRUMENTS) ×2 IMPLANT
RELOAD STAPLE 4.8 BLK F/HERNIA (STAPLE) IMPLANT
SCISSORS LAP 5X35 DISP (ENDOMECHANICALS) ×2 IMPLANT
SET TUBE SMOKE EVAC HIGH FLOW (TUBING) ×2 IMPLANT
SLEEVE Z-THREAD 5X100MM (TROCAR) ×2 IMPLANT
SPIKE FLUID TRANSFER (MISCELLANEOUS) IMPLANT
STAPLER HERNIA 12 8.5 360D (INSTRUMENTS) ×2 IMPLANT
SUT MNCRL AB 4-0 PS2 18 (SUTURE) ×2 IMPLANT
SUT VICRYL 0 UR6 27IN ABS (SUTURE) IMPLANT
TOWEL OR 17X26 10 PK STRL BLUE (TOWEL DISPOSABLE) ×2 IMPLANT
TRAY FOL W/BAG SLVR 16FR STRL (SET/KITS/TRAYS/PACK) IMPLANT
TRAY LAPAROSCOPIC (CUSTOM PROCEDURE TRAY) ×2 IMPLANT
TROCAR ADV FIXATION 12X100MM (TROCAR) ×2 IMPLANT
TROCAR Z-THREAD FIOS 5X100MM (TROCAR) ×2 IMPLANT

## 2023-10-02 NOTE — Anesthesia Preprocedure Evaluation (Signed)
 Anesthesia Evaluation  Patient identified by MRN, date of birth, ID band Patient awake    Reviewed: Allergy & Precautions, NPO status , Patient's Chart, lab work & pertinent test results  Airway Mallampati: II  TM Distance: >3 FB Neck ROM: Full    Dental  (+) Dental Advisory Given, Lower Dentures, Upper Dentures   Pulmonary COPD (2L [place]),  COPD inhaler and oxygen  dependent, former smoker   Pulmonary exam normal breath sounds clear to auscultation       Cardiovascular hypertension, Pt. on medications + CAD and + Past MI  Normal cardiovascular exam Rhythm:Regular Rate:Normal     Neuro/Psych  Headaches PSYCHIATRIC DISORDERS Anxiety Depression       GI/Hepatic ,GERD  Medicated and Controlled,,(+)     substance abuse  alcohol use and cocaine useUmbilical hernia without obstruction and without gangrene     Right inguinal hernia     Endo/Other  negative endocrine ROS    Renal/GU negative Renal ROS     Musculoskeletal  (+) Arthritis ,    Abdominal   Peds  Hematology  (+) Blood dyscrasia, anemia   Anesthesia Other Findings Day of surgery medications reviewed with the patient.  Reproductive/Obstetrics                              Anesthesia Physical Anesthesia Plan  ASA: 4  Anesthesia Plan: General   Post-op Pain Management: Tylenol  PO (pre-op)* and Toradol  IV (intra-op)*   Induction: Intravenous  PONV Risk Score and Plan: 3 and Midazolam , Ondansetron  and Dexamethasone   Airway Management Planned: Oral ETT  Additional Equipment:   Intra-op Plan:   Post-operative Plan: Extubation in OR  Informed Consent: I have reviewed the patients History and Physical, chart, labs and discussed the procedure including the risks, benefits and alternatives for the proposed anesthesia with the patient or authorized representative who has indicated his/her understanding and acceptance.     Dental  advisory given  Plan Discussed with: CRNA  Anesthesia Plan Comments:          Anesthesia Quick Evaluation
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
## 2023-10-02 NOTE — Op Note (Signed)
   Patient: [name] (1955/02/16, [id number])  Date of Surgery: 10/02/2023  Preoperative Diagnosis: RIGHT INGUINAL AND UMBILICAL HERNIAS  0.4 cm x 0.8 cm on preop CT  Postoperative Diagnosis: RIGHT INGUINAL AND UMBILICAL HERNIAS  0.4 cm x 0.8 cm on preop CT  Surgical Procedure:  REPAIR, HERNIA, INGUINAL, LAPAROSCOPIC REPAIR, HERNIA, UMBILICAL, ADULT    Operative Team Members:  Surgeons and Role:    * Luismanuel Corman, Deward PARAS, MD - Primary   Anesthesiologist: Corinne Garnette BRAVO, MD CRNA: Nanci Riis, CRNA   Anesthesia: General   Fluids:  Total I/O In: -  Out: 40 [Urine:20; Blood:20]  Complications: None  Drains:  None  Specimen: None  Disposition:  PACU - hemodynamically stable.  Plan of Care: Discharge to home after PACU  Indications for Procedure: [name] is a 68 y.o. male who presented with right inguinal and umbilical hernias.  I recommended laparoscopic right inguinal hernia repair with mesh and primary closure of umbilical hernia defect.  We discussed the procedure, its risks, benefits and alternatives and the patient granted consent to proceed.  Findings:  Technique: Transabdominal preperitoneal (TAPP) Hernia Location: Umbilical  0.4 cm x 0.8 cm, closed with vicryl suture, right indirect inguinal Mesh Size &Type:  Bard 3D max extra large right sided mesh Mesh Fixation: Endo-Universal hernia stapler  Infection status: Patient: Private Patient Elective Case Case: Elective Infection Present At Time Of Surgery (PATOS): None   Description of Procedure:  The patient was positioned supine, padded and secured to the bed, with both arms tucked.  The abdomen was widely prepped and draped.  A time out procedure was performed.  A 1 cm infraumbilical incision was made.  The abdomen was entered without trauma to the underlying viscera.  The abdomen was insufflated to 15 mm of Hg.  A 12 mm trocar was inserted at the periumbilical incision.  Additional 5 mm  trocars were placed in the left and right abdomen.  There was no trauma to the underlying viscera.  There was an indirect hernia on the RIGHT.  Utilizing a transabdominal pre peritoneal technique (TAPP), a horizontal incision was made in the peritoneum, immediately below the umbilicus.  Dissection was carried out in the pre peritoneal space down to the level of the hernia sac which was reduced into the peritoneal cavity completely.  The cord contents were parietalized and preserved.  A large pre peritoneal dissection was performed to uncover the direct, indirect, femoral and obturator spaces.  Cooper's ligament was uncovered medially and the psoas muscle uncovered laterally.  The mesh, as documented above, was opened and advanced into the pre peritoneal position so that it more than adequately covered the indirect, direct, femoral and obturator spaces.  The mesh laid flat, with no inferior folds and covered the entire myopectineal orifice.  The mesh was fixated with the endo-universal hernia stapler to Cooper's ligament and the posterior aspect of the rectus muscle.  The peritoneal flap was closed with the same device.  There were no peritoneal defects or exposed mesh at the conclusion.  The umbilical trocar was removed and the fascial defect was closed with a 0 Vicryl suture.  The peritoneal cavity was completely desufflated, the trocars removed and the skin closed with 4-0 Monocryl subcuticular suture and skin glue.  All sponge and needle counts were correct at the end of the case.  Deward Foy, MD General, Bariatric, & Minimally Invasive Surgery [facility], [place]

## 2023-10-02 NOTE — Discharge Instructions (Signed)
 GROIN HERNIA REPAIR POST OPERATIVE INSTRUCTIONS  Thinking Clearly  The anesthesia may cause you to feel different for 1 or 2 days. Do not drive, drink alcohol, or make any big decisions for at least 2 days.  Nutrition When you wake up, you will be able to drink small amounts of liquid. If you do not feel sick, you can slowly advance your diet to regular foods. Continue to drink lots of fluids, usually about 8 to 10 glasses per day. Eat a high-fiber diet so you don't strain during bowel movements. High-Fiber Foods Foods high in fiber include beans, bran cereals and whole-grain breads, peas, dried fruit (figs, apricots, and dates), raspberries, blackberries, strawberries, sweet corn, broccoli, baked potatoes with skin, plums, pears, apples, greens, and nuts. Activity Slowly increase your activity. Be sure to get up and walk every hour or so to prevent blood clots. No heavy lifting or strenuous activity for 4 weeks following surgery to prevent hernias at your incision sites or recurrence of your hernia. It is normal to feel tired. You may need more sleep than usual.  Get your rest but make sure to get up and move around frequently to prevent blood clots and pneumonia.  Work and Return to [employer] can go back to work when you feel well enough. Discuss the timing with your surgeon. You can usually go back to school or work 1 week or less after an laparoscopic or an open repair. If your work requires heavy lifting or strenuous activity you need to be placed on light duty for 4 weeks following surgery. You can return to gym class, sports or other physical activities 4 weeks after surgery.  Wound Care You may experience significant bruising in the groin including into the scrotum in males.  Rest, elevating the groin and scrotum above the level of the heart, ice and compression with tight fitting underwear can help.  Always wash your hands before and after touching near your incision site. Do  not soak in a bathtub until cleared at your follow up appointment. You may take a shower 24 hours after surgery. A small amount of drainage from the incision is normal. If the drainage is thick and yellow or the site is red, you may have an infection, so call your surgeon. If you have a drain in one of your incisions, it will be taken out in office when the drainage stops. Steri-Strips will fall off in 7 to 10 days or they will be removed during your first office visit. If you have dermabond glue covering over the incision, allow the glue to flake off on its own. Protect the new skin, especially from the sun. The sun can burn and cause darker scarring. Your scar will heal in about 4 to 6 weeks and will become softer and continue to fade over the next year.  The cosmetic appearance of the incisions will improve over the course of the first year after surgery. Sensation around your incision will return in a few weeks or months.  Bowel Movements After intestinal surgery, you may have loose watery stools for several days. If watery diarrhea lasts longer than 3 days, contact your surgeon. Pain medication (narcotics) can cause constipation. Increase the fiber in your diet with high-fiber foods if you are constipated. You can take an over the counter stool softener like Colace to avoid constipation.  Additional over the counter medications can also be used if Colace isn't sufficient (for example, Milk of Magnesia or Miralax).  Pain The amount of pain is different for each person. Some people need only 1 to 3 doses of pain control medication, while others need more. Take alternating doses of tylenol and ibuprofen around the clock for the first five days following surgery.  This will provide a baseline of pain control and help with inflammation.  Take the narcotic pain medication in addition if needed for severe pain.  Contact Your Surgeon at [phone], if you have: Pain that will not go away Pain that  gets worse A fever of more than 101F (38.3C) Repeated vomiting Swelling, redness, bleeding, or bad-smelling drainage from your wound site Strong abdominal pain No bowel movement or unable to pass gas for 3 days Watery diarrhea lasting longer than 3 days  Pain Control The goal of pain control is to minimize pain, keep you moving and help you heal. Your surgical team will work with you on your pain plan. Most often a combination of therapies and medications are used to control your pain. You may also be given medication (local anesthetic) at the surgical site. This may help control your pain for several days. Extreme pain puts extra stress on your body at a time when your body needs to focus on healing. Do not wait until your pain has reached a level "10" or is unbearable before telling your doctor or nurse. It is much easier to control pain before it becomes severe. Following a laparoscopic procedure, pain is sometimes felt in the shoulder. This is due to the gas inserted into your abdomen during the procedure. Moving and walking helps to decrease the gas and the right shoulder pain.  Use the guide below for ways to manage your post-operative pain. Learn more by going to facs.org/safepaincontrol.  How Intense Is My Pain Common Therapies to Feel Better       I hardly notice my pain, and it does not interfere with my activities.  I notice my pain and it distracts me, but I can still do activities (sitting up, walking, standing).  Non-Medication Therapies  Ice (in a bag, applied over clothing at the surgical site), elevation, rest, meditation, massage, distraction (music, TV, play) walking and mild exercise Splinting the abdomen with pillows +  Non-Opioid Medications Acetaminophen (Tylenol) Non-steroidal anti-inflammatory drugs (NSAIDS) Aspirin, Ibuprofen (Motrin, Advil) Naproxen (Aleve) Take these as needed, when you feel pain. Both acetaminophen and NSAIDs help to decrease pain  and swelling (inflammation).      My pain is hard to ignore and is more noticeable even when I rest.  My pain interferes with my usual activities.  Non-Medication Therapies  +  Non-Opioid medications  Take on a regular schedule (around-the-clock) instead of as needed. (For example, Tylenol every 6 hours at 9:00 am, 3:00 pm, 9:00 pm, 3:00 am and Motrin every 6 hours at 12:00 am, 6:00 am, 12:00 pm, 6:00 pm)         I am focused on my pain, and I am not doing my daily activities.  I am groaning in pain, and I cannot sleep. I am unable to do anything.  My pain is as bad as it could be, and nothing else matters.  Non-Medication Therapies  +  Around-the-Clock Non-Opioid Medications  +  Short-acting opioids  Opioids should be used with other medications to manage severe pain. Opioids block pain and give a feeling of euphoria (feel high). Addiction, a serious side effect of opioids, is rare with short-term (a few days) use.  Examples of short-acting opioids  include: Tramadol (Ultram), Hydrocodone (Norco, Vicodin), Hydromorphone (Dilaudid), Oxycodone (Oxycontin)     The above directions have been adapted from the [employer] of [employer] Surgical Patient Education Program.  Please refer to the ACS website if needed: http://chapman.info/.ashx   Ivar Drape, MD [facility] [address], [address], [place], [place]  [address] ?  [address] [address], [place], [place]   [address] [phone] ? [phone] ? FAX [phone] Web site: [email or web address]

## 2023-10-02 NOTE — Anesthesia Postprocedure Evaluation (Signed)
 Anesthesia Post Note  Patient: [name]  Procedure(s) Performed: REPAIR, HERNIA, INGUINAL, LAPAROSCOPIC (Right) REPAIR, HERNIA, UMBILICAL, ADULT     Patient location during evaluation: PACU Anesthesia Type: General Level of consciousness: awake and alert Pain management: pain level controlled Vital Signs Assessment: post-procedure vital signs reviewed and stable Respiratory status: spontaneous breathing, nonlabored ventilation and respiratory function stable Cardiovascular status: blood pressure returned to baseline and stable Postop Assessment: no apparent nausea or vomiting Anesthetic complications: no   No notable events documented.  Last Vitals:  Vitals:   10/02/23 1245 10/02/23 1300  BP: (!) 150/82 135/86  Pulse: (!) 52 (!) 58  Resp: 10 12  Temp:  (!) 36.4 C  SpO2: 91% 95%    Last Pain:  Vitals:   10/02/23 1300  TempSrc:   PainSc: Asleep                 Garnette FORBES Skillern

## 2023-10-02 NOTE — Anesthesia Procedure Notes (Signed)
 Procedure Name: Intubation Date/Time: 10/02/2023 9:54 AM  Performed by: Nanci Riis, CRNAPre-anesthesia Checklist: Patient identified, Emergency Drugs available, Suction available, Patient being monitored and Timeout performed Patient Re-evaluated:Patient Re-evaluated prior to induction Oxygen  Delivery Method: Circle system utilized Preoxygenation: Pre-oxygenation with 100% oxygen  Induction Type: IV induction Ventilation: Mask ventilation without difficulty Laryngoscope Size: Miller and 3 Grade View: Grade I Tube type: Oral Tube size: 7.5 mm Number of attempts: 1 Airway Equipment and Method: Stylet Placement Confirmation: ETT inserted through vocal cords under direct vision, positive ETCO2 and breath sounds checked- equal and bilateral Secured at: 21 cm Tube secured with: Tape Dental Injury: Teeth and Oropharynx as per pre-operative assessment

## 2023-10-02 NOTE — Plan of Care (Signed)

## 2023-10-02 NOTE — Transfer of Care (Signed)
 Immediate Anesthesia Transfer of Care Note  Patient: [name]  Procedure(s) Performed: REPAIR, HERNIA, INGUINAL, LAPAROSCOPIC (Right) REPAIR, HERNIA, UMBILICAL, ADULT  Patient Location: PACU  Anesthesia Type:General  Level of Consciousness: drowsy and patient cooperative  Airway & Oxygen  Therapy: Patient Spontanous Breathing and Patient connected to nasal cannula oxygen   Post-op Assessment: Report given to RN and Post -op Vital signs reviewed and stable  Post vital signs: Reviewed and stable  Last Vitals:  Vitals Value Taken Time  BP 141/73 10/02/23 11:02  Temp    Pulse 68 10/02/23 11:04  Resp 15 10/02/23 11:04  SpO2 91 % 10/02/23 11:04  Vitals shown include unfiled device data.  Last Pain:  Vitals:   10/02/23 0813  TempSrc: Oral         Complications: No notable events documented.

## 2023-10-03 ENCOUNTER — Other Ambulatory Visit: Payer: Self-pay

## 2023-10-03 MED ORDER — ALBUTEROL SULFATE (2.5 MG/3ML) 0.083% IN NEBU
3.0000 mL | INHALATION_SOLUTION | Freq: Two times a day (BID) | RESPIRATORY_TRACT | Status: DC
  Administered 2023-10-03 – 2023-10-04 (×3): 3 mL via RESPIRATORY_TRACT
  Filled 2023-10-03 (×3): qty 3

## 2023-10-03 MED ORDER — ALBUTEROL SULFATE (2.5 MG/3ML) 0.083% IN NEBU
3.0000 mL | INHALATION_SOLUTION | RESPIRATORY_TRACT | Status: DC
  Administered 2023-10-03: 3 mL via RESPIRATORY_TRACT
  Filled 2023-10-03 (×2): qty 3

## 2023-10-03 MED ORDER — METOCLOPRAMIDE HCL 5 MG PO TABS
5.0000 mg | ORAL_TABLET | Freq: Three times a day (TID) | ORAL | Status: AC | PRN
  Administered 2023-10-03: 5 mg via ORAL
  Filled 2023-10-03: qty 1

## 2023-10-03 NOTE — Plan of Care (Signed)
  Problem: Education: Goal: Knowledge of General Education information will improve Description: Including pain rating scale, medication(s)/side effects and non-pharmacologic comfort measures Outcome: Progressing   Problem: Clinical Measurements: Goal: Ability to maintain clinical measurements within normal limits will improve Outcome: Progressing   Problem: Coping: Goal: Level of anxiety will decrease Outcome: Progressing   Problem: Pain Managment: Goal: General experience of comfort will improve and/or be controlled Outcome: Progressing   Problem: Safety: Goal: Ability to remain free from injury will improve Outcome: Progressing

## 2023-10-03 NOTE — Progress Notes (Signed)
 PT wished to receive Albuterol  nebulizer's BID this admission (as he takes at home). Despite allergy warning PT does take albuterol  at home and has been prescribed here this admission.

## 2023-10-03 NOTE — Progress Notes (Signed)
 1 Day Post-Op   Subjective/Chief Complaint: Sore at surgical sites , voiding, has had some wheezing, on oxygen -is on at home as well   Objective: Vital signs in last 24 hours: Temp:  [97.4 F (36.3 C)-97.7 F (36.5 C)] 97.7 F (36.5 C) (08/30 0535) Pulse Rate:  [47-70] 59 (08/30 0135) Resp:  [10-18] 16 (08/30 0535) BP: (118-159)/(73-95) 118/73 (08/30 0535) SpO2:  [90 %-97 %] 93 % (08/30 0535) Weight:  [66.7 kg] 66.7 kg (08/29 0800) Last BM Date :  (pta)  Intake/Output from previous day: 08/29 0701 - 08/30 0700 In: 613.3 [P.O.:600; I.V.:13.3] Out: 760 [Urine:740; Blood:20] Intake/Output this shift: No intake/output data recorded.  General nad Pulm no wheezing currently Ab soft approp tender incisions clean  Lab Results:  Recent Labs    10/02/23 1541  WBC 10.5  HGB 14.1  HCT 46.9  PLT 324   BMET Recent Labs    10/02/23 1541  CREATININE 1.18   PT/INR No results for input(s): LABPROT, INR in the last 72 hours. ABG No results for input(s): PHART, HCO3 in the last 72 hours.  Invalid input(s): PCO2, PO2  Studies/Results: No results found.  Anti-infectives: Anti-infectives (From admission, onward)    Start     Dose/Rate Route Frequency Ordered Stop   10/02/23 0845  ceFAZolin  (ANCEF ) IVPB 2g/100 mL premix        2 g 200 mL/hr over 30 Minutes Intravenous On call to O.R. 10/02/23 0754 10/02/23 1005       Assessment/Plan: POD 1 UH, IH repair -doing as expected, on his home meds -will ambulate, oob see how breakfast goes and may go home later -he appears to be close to baseline     Donnice Bury 10/03/2023

## 2023-10-03 NOTE — Progress Notes (Signed)
   10/03/23 0843  TOC Brief Assessment  Insurance and Status Reviewed  Patient has primary care physician Yes  Home environment has been reviewed Resides alone in an apartment  Prior level of function: Independent with ADLs at baseline  Prior/Current Home Services Current home services (Has an aide)  Social Drivers of Health Review SDOH reviewed no interventions necessary  Readmission risk has been reviewed Yes  Transition of care needs no transition of care needs at this time

## 2023-10-03 NOTE — Plan of Care (Signed)
  Problem: Health Behavior/Discharge Planning: Goal: Ability to manage health-related needs will improve Outcome: Progressing   Problem: Activity: Goal: Risk for activity intolerance will decrease Outcome: Progressing   Problem: Nutrition: Goal: Adequate nutrition will be maintained Outcome: Progressing   Problem: Pain Managment: Goal: General experience of comfort will improve and/or be controlled Outcome: Progressing   Problem: Safety: Goal: Ability to remain free from injury will improve Outcome: Progressing

## 2023-10-04 DIAGNOSIS — Z7951 Long term (current) use of inhaled steroids: Secondary | ICD-10-CM | POA: Diagnosis not present

## 2023-10-04 DIAGNOSIS — Z9981 Dependence on supplemental oxygen: Secondary | ICD-10-CM | POA: Diagnosis not present

## 2023-10-04 DIAGNOSIS — J9611 Chronic respiratory failure with hypoxia: Secondary | ICD-10-CM | POA: Diagnosis present

## 2023-10-04 DIAGNOSIS — Z85528 Personal history of other malignant neoplasm of kidney: Secondary | ICD-10-CM | POA: Diagnosis not present

## 2023-10-04 DIAGNOSIS — F419 Anxiety disorder, unspecified: Secondary | ICD-10-CM | POA: Diagnosis present

## 2023-10-04 DIAGNOSIS — Z905 Acquired absence of kidney: Secondary | ICD-10-CM | POA: Diagnosis not present

## 2023-10-04 DIAGNOSIS — Z87891 Personal history of nicotine dependence: Secondary | ICD-10-CM | POA: Diagnosis not present

## 2023-10-04 DIAGNOSIS — I252 Old myocardial infarction: Secondary | ICD-10-CM | POA: Diagnosis not present

## 2023-10-04 DIAGNOSIS — E785 Hyperlipidemia, unspecified: Secondary | ICD-10-CM | POA: Diagnosis present

## 2023-10-04 DIAGNOSIS — K429 Umbilical hernia without obstruction or gangrene: Secondary | ICD-10-CM | POA: Diagnosis present

## 2023-10-04 DIAGNOSIS — J449 Chronic obstructive pulmonary disease, unspecified: Secondary | ICD-10-CM | POA: Diagnosis not present

## 2023-10-04 DIAGNOSIS — K219 Gastro-esophageal reflux disease without esophagitis: Secondary | ICD-10-CM | POA: Diagnosis present

## 2023-10-04 DIAGNOSIS — J441 Chronic obstructive pulmonary disease with (acute) exacerbation: Secondary | ICD-10-CM | POA: Diagnosis not present

## 2023-10-04 DIAGNOSIS — K4090 Unilateral inguinal hernia, without obstruction or gangrene, not specified as recurrent: Secondary | ICD-10-CM | POA: Diagnosis present

## 2023-10-04 DIAGNOSIS — I2510 Atherosclerotic heart disease of native coronary artery without angina pectoris: Secondary | ICD-10-CM | POA: Diagnosis present

## 2023-10-04 DIAGNOSIS — Z888 Allergy status to other drugs, medicaments and biological substances status: Secondary | ICD-10-CM | POA: Diagnosis not present

## 2023-10-04 DIAGNOSIS — Z860100 Personal history of colon polyps, unspecified: Secondary | ICD-10-CM | POA: Diagnosis not present

## 2023-10-04 DIAGNOSIS — Z981 Arthrodesis status: Secondary | ICD-10-CM | POA: Diagnosis not present

## 2023-10-04 DIAGNOSIS — F32A Depression, unspecified: Secondary | ICD-10-CM | POA: Diagnosis present

## 2023-10-04 DIAGNOSIS — J439 Emphysema, unspecified: Secondary | ICD-10-CM | POA: Diagnosis present

## 2023-10-04 DIAGNOSIS — Z85118 Personal history of other malignant neoplasm of bronchus and lung: Secondary | ICD-10-CM | POA: Diagnosis not present

## 2023-10-04 DIAGNOSIS — Z8616 Personal history of COVID-19: Secondary | ICD-10-CM | POA: Diagnosis not present

## 2023-10-04 DIAGNOSIS — Z79899 Other long term (current) drug therapy: Secondary | ICD-10-CM | POA: Diagnosis not present

## 2023-10-04 MED ORDER — METHYLPREDNISOLONE SODIUM SUCC 40 MG IJ SOLR
40.0000 mg | Freq: Two times a day (BID) | INTRAMUSCULAR | Status: AC
  Administered 2023-10-04 (×2): 40 mg via INTRAVENOUS
  Filled 2023-10-04 (×2): qty 1

## 2023-10-04 MED ORDER — PREDNISONE 20 MG PO TABS
20.0000 mg | ORAL_TABLET | Freq: Every day | ORAL | Status: DC
  Administered 2023-10-05: 20 mg via ORAL
  Filled 2023-10-04: qty 1

## 2023-10-04 MED ORDER — POLYETHYLENE GLYCOL 3350 17 G PO PACK
17.0000 g | PACK | Freq: Every day | ORAL | Status: DC
  Administered 2023-10-04: 17 g via ORAL
  Filled 2023-10-04: qty 1

## 2023-10-04 MED ORDER — BUDESONIDE 0.25 MG/2ML IN SUSP
0.2500 mg | Freq: Two times a day (BID) | RESPIRATORY_TRACT | Status: DC
  Administered 2023-10-04: 0.25 mg via RESPIRATORY_TRACT
  Filled 2023-10-04: qty 2

## 2023-10-04 MED ORDER — GUAIFENESIN-DM 100-10 MG/5ML PO SYRP
10.0000 mL | ORAL_SOLUTION | Freq: Four times a day (QID) | ORAL | Status: DC | PRN
  Administered 2023-10-04 – 2023-10-05 (×3): 10 mL via ORAL
  Filled 2023-10-04 (×3): qty 10

## 2023-10-04 MED ORDER — REVEFENACIN 175 MCG/3ML IN SOLN: 175.0000 ug | Freq: Every day | RESPIRATORY_TRACT | Status: DC

## 2023-10-04 MED ORDER — ARFORMOTEROL TARTRATE 15 MCG/2ML IN NEBU
15.0000 ug | INHALATION_SOLUTION | Freq: Two times a day (BID) | RESPIRATORY_TRACT | Status: DC
  Administered 2023-10-04: 15 ug via RESPIRATORY_TRACT
  Filled 2023-10-04: qty 2

## 2023-10-04 NOTE — Plan of Care (Signed)
   Problem: Education: Goal: Knowledge of General Education information will improve Description: Including pain rating scale, medication(s)/side effects and non-pharmacologic comfort measures Outcome: Progressing   Problem: Activity: Goal: Risk for activity intolerance will decrease Outcome: Progressing

## 2023-10-04 NOTE — Plan of Care (Signed)
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
## 2023-10-04 NOTE — Progress Notes (Signed)
 2 Days Post-Op   Subjective/Chief Complaint: Complains of soreness, no bm yesterday. States my lungs are hurting me and that he has uncontrolled wheezing   Objective: Vital signs in last 24 hours: Temp:  [97.8 F (36.6 C)-98.5 F (36.9 C)] 98.5 F (36.9 C) (08/31 0644) Pulse Rate:  [54-73] 56 (08/31 0644) Resp:  [16-18] 16 (08/31 0644) BP: (114-133)/(75-92) 133/75 (08/31 0644) SpO2:  [85 %-96 %] 93 % (08/31 0720) Last BM Date :  (pta)  Intake/Output from previous day: 08/30 0701 - 08/31 0700 In: 960 [P.O.:960] Out: 2400 [Urine:2400] Intake/Output this shift: No intake/output data recorded.  General nad Appears to be breathing comfortably Ab soft approp tender incisions clean  Lab Results:  Recent Labs    10/02/23 1541  WBC 10.5  HGB 14.1  HCT 46.9  PLT 324   BMET Recent Labs    10/02/23 1541  CREATININE 1.18   PT/INR No results for input(s): LABPROT, INR in the last 72 hours. ABG No results for input(s): PHART, HCO3 in the last 72 hours.  Invalid input(s): PCO2, PO2  Studies/Results: No results found.  Anti-infectives: Anti-infectives (From admission, onward)    Start     Dose/Rate Route Frequency Ordered Stop   10/02/23 0845  ceFAZolin  (ANCEF ) IVPB 2g/100 mL premix        2 g 200 mL/hr over 30 Minutes Intravenous On call to O.R. 10/02/23 0754 10/02/23 1005       Assessment/Plan: POD 2 UH/IH repair -will add miralax  today -needs to be oob- states he cannot walk even with walker so PT will need to see him -I have also asked pulmonary to see him as he follows with them due to his concerns -lovenox     Donnice Bury 10/04/2023

## 2023-10-04 NOTE — Evaluation (Signed)
 Physical Therapy Evaluation Patient Details Name: [name] MRN: [id number] DOB: 10/17/1955 Today's Date: 10/04/2023  History of Present Illness  Pt is a 2 male s/p right inguinal and umbilical hernias repair on 10/02/23.  PMHx: underlying chronic respiratory failure, severe underlying obstructive lung disease, chronic low back pain, lung cancer, CAD, emphysema, GERD, MI  Clinical Impression  Pt admitted with above diagnosis.  Pt currently with functional limitations due to the deficits listed below (see PT Problem List). Pt will benefit from acute skilled PT to increase their independence and safety with mobility to allow discharge.  Pt sitting in recliner on arrival. Pt ambulated in hallway and then assisted to bathroom prior to return to bed.  Pt able to ambulate 120 ft with RW and remained on 3L O2 [place] (reports baseline 2L O2 at home).  Pt anticipates d/c soon and agreeable to HHPT.  Pt may also benefit from rollator at d/c.         If plan is discharge home, recommend the following:     Can travel by private vehicle        Equipment Recommendations Rollator (4 wheels)  Recommendations for Other Services       Functional Status Assessment Patient has had a recent decline in their functional status and demonstrates the ability to make significant improvements in function in a reasonable and predictable amount of time.     Precautions / Restrictions Precautions Precautions: Fall Precaution/Restrictions Comments: reports on 2L O2 [place] at baseline      Mobility  Bed Mobility Overal bed mobility: Needs Assistance Bed Mobility: Sit to Supine       Sit to supine: Supervision        Transfers Overall transfer level: Needs assistance Equipment used: Rolling walker (2 wheels) Transfers: Sit to/from Stand Sit to Stand: Contact guard assist           General transfer comment: verbal cues for hand placement    Ambulation/Gait Ambulation/Gait assistance: Contact  guard assist Gait Distance (Feet): 120 Feet Assistive device: Rolling walker (2 wheels) Gait Pattern/deviations: Step-through pattern, Decreased stride length       General Gait Details: steady with RW, ambulated 60 ft with standing rest break (to look out window), remained on 3L O2 [place] (pt reports typically 2L at home but was on 3L at rest in room today)  Stairs            Wheelchair Mobility     Tilt Bed    Modified Rankin (Stroke Patients Only)       Balance                                             Pertinent Vitals/Pain Pain Assessment Pain Assessment: Faces Faces Pain Scale: Hurts little more Pain Location: surgical site Pain Descriptors / Indicators: Sore Pain Intervention(s): Repositioned, Monitored during session    Home Living Family/patient expects to be discharged to:: Private residence Living Arrangements: Alone   Type of Home: Apartment Home Access: Elevator       Home Layout: One level Home Equipment: Cane - single point      Prior Function Prior Level of Function : Independent/Modified Independent               ADLs Comments: reports independent but does have an aide that comes daily for about an hour does whatever  I need     Extremity/Trunk Assessment        Lower Extremity Assessment Lower Extremity Assessment: Generalized weakness    Cervical / Trunk Assessment Cervical / Trunk Assessment: Normal  Communication   Communication Communication: No apparent difficulties    Cognition Arousal: Alert Behavior During Therapy: WFL for tasks assessed/performed   PT - Cognitive impairments: No apparent impairments                         Following commands: Intact       Cueing       General Comments      Exercises     Assessment/Plan    PT Assessment Patient needs continued PT services  PT Problem List Decreased strength;Decreased activity tolerance;Decreased balance;Decreased  mobility;Decreased knowledge of use of DME       PT Treatment Interventions Gait training;DME instruction;Balance training;Functional mobility training;Patient/family education;Therapeutic activities;Therapeutic exercise    PT Goals (Current goals can be found in the Care Plan section)  Acute Rehab PT Goals PT Goal Formulation: With patient Time For Goal Achievement: 10/18/23 Potential to Achieve Goals: Good    Frequency Min 3X/week     Co-evaluation               AM-PAC PT 6 Clicks Mobility  Outcome Measure Help needed turning from your back to your side while in a flat bed without using bedrails?: A Little Help needed moving from lying on your back to sitting on the side of a flat bed without using bedrails?: A Little Help needed moving to and from a bed to a chair (including a wheelchair)?: A Little Help needed standing up from a chair using your arms (e.g., wheelchair or bedside chair)?: A Little Help needed to walk in hospital room?: A Little Help needed climbing 3-5 steps with a railing? : A Little 6 Click Score: 18    End of Session   Activity Tolerance: Patient tolerated treatment well Patient left: in bed;with call bell/phone within reach;with bed alarm set   PT Visit Diagnosis: Difficulty in walking, not elsewhere classified (R26.2);Muscle weakness (generalized) (M62.81)    Time: 1535-1550 PT Time Calculation (min) (ACUTE ONLY): 15 min   Charges:   PT Evaluation $PT Eval Low Complexity: 1 Low   PT General Charges $$ ACUTE PT VISIT: 1 Visit        Tari PT, DPT Physical Therapist Acute Rehabilitation Services Office: [phone]   Tari CROME [place] 10/04/2023, 4:43 PM

## 2023-10-05 ENCOUNTER — Other Ambulatory Visit: Payer: Self-pay | Admitting: Pulmonary Disease

## 2023-10-05 DIAGNOSIS — J449 Chronic obstructive pulmonary disease, unspecified: Secondary | ICD-10-CM | POA: Diagnosis not present

## 2023-10-05 MED ORDER — PREDNISONE 20 MG PO TABS: 20.0000 mg | ORAL_TABLET | Freq: Every day | ORAL | 0 refills | Status: DC

## 2023-10-05 MED ORDER — TRELEGY ELLIPTA 200-62.5-25 MCG/ACT IN AEPB: 1.0000 | INHALATION_SPRAY | Freq: Every day | RESPIRATORY_TRACT | 3 refills | Status: DC

## 2023-10-05 MED ORDER — GUAIFENESIN-DM 100-10 MG/5ML PO SYRP: 5.0000 mL | ORAL_SOLUTION | ORAL | 0 refills | Status: DC | PRN

## 2023-10-05 NOTE — TOC Transition Note (Signed)
 Transition of Care [facility]) - Discharge Note   Patient Details  Name: Terry Terry MRN: [id number] Date of Birth: [birth year]  [facility] CM/SW Contact:  Terry ASPEN, LCSW Phone Number: 10/05/2023, 10:11 AM   Clinical Narrative:     Met with pt who is agreeable with PT recommendation for HHPT visits and no agency preference.  Referral placed with [facility]. No further TOC needs.  Final next level of care: Home w Home Health Services Barriers to Discharge: No Barriers Identified   Patient Goals and CMS Choice Patient states their goals for this hospitalization and ongoing recovery are:: return home          Discharge Placement                       Discharge Plan and Services Additional resources added to the After Visit Summary for                  DME Arranged: N/A DME Agency: NA       HH Arranged: PT HH Agency: [facility] Date [facility] Agency Contacted: 10/05/23 Time HH Agency Contacted: 1011 Representative spoke with at [facility] Agency: [place]   Social Drivers of Health (SDOH) Interventions SDOH Screenings   Food Insecurity: No Food Insecurity (10/03/2023)  Housing: Unknown (10/03/2023)  Transportation Needs: Unmet Transportation Needs (10/03/2023)  Utilities: Not At Risk (10/03/2023)  Alcohol Screen: Low Risk  (11/05/2022)  Depression (PHQ2-9): Low Risk  (09/18/2023)  Financial Resource Strain: Low Risk  (11/05/2022)  Physical Activity: Insufficiently Active (11/05/2022)  Social Connections: Moderately Isolated (10/03/2023)  Stress: Stress Concern Present (11/05/2022)  Tobacco Use: Medium Risk (10/02/2023)     Readmission Risk Interventions    10/05/2023   10:10 AM  Readmission Risk Prevention Plan  Transportation Screening Complete  PCP or Specialist Appt within 5-7 Days Complete  Home Care Screening Complete  Medication Review (RN CM) Complete
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
## 2023-10-05 NOTE — Plan of Care (Signed)

## 2023-10-05 NOTE — Progress Notes (Signed)
 3 Days Post-Op   Subjective/Chief Complaint: Better, breathing is improved, up and around has walker at home   Objective: Vital signs in last 24 hours: Temp:  [97.7 F (36.5 C)-98.3 F (36.8 C)] 98.2 F (36.8 C) (09/01 0530) Pulse Rate:  [65-71] 65 (09/01 0530) Resp:  [16-20] 18 (09/01 0530) BP: (133-150)/(85-96) 148/96 (09/01 0530) SpO2:  [91 %-94 %] 92 % (09/01 0530) Last BM Date :  (pta)  Intake/Output from previous day: 08/31 0701 - 09/01 0700 In: 960 [P.O.:960] Out: 1600 [Urine:1600] Intake/Output this shift: No intake/output data recorded.  General nad Appears to be breathing comfortably Ab soft approp tender incisions clean  Lab Results:  Recent Labs    10/02/23 1541  WBC 10.5  HGB 14.1  HCT 46.9  PLT 324   BMET Recent Labs    10/02/23 1541  CREATININE 1.18   PT/INR No results for input(s): LABPROT, INR in the last 72 hours. ABG No results for input(s): PHART, HCO3 in the last 72 hours.  Invalid input(s): PCO2, PO2  Studies/Results: No results found.  Anti-infectives: Anti-infectives (From admission, onward)    Start     Dose/Rate Route Frequency Ordered Stop   10/02/23 0845  ceFAZolin  (ANCEF ) IVPB 2g/100 mL premix        2 g 200 mL/hr over 30 Minutes Intravenous On call to O.R. 10/02/23 0754 10/02/23 1005       Assessment/Plan: POD 3 UH/IH repair -feels better -if pulmonary thinks he can go home will dc today- please put any meds you want him on in for rx -PT cleared and has walker at home -lovenox    Donnice Bury 10/05/2023

## 2023-10-06 ENCOUNTER — Encounter (HOSPITAL_COMMUNITY): Payer: Self-pay | Admitting: Surgery

## 2023-10-08 NOTE — Discharge Summary (Signed)
 Patient ID: [name] [id number] 68 y.o. [birth year]  10/02/2023  Discharge date and time: 10/08/2023  Admitting Physician: Deward PARAS Keundra Petrucelli  Discharge Physician: Deward PARAS Jeanean Hollett  Admission Diagnoses: Umbilical hernia without obstruction and without gangrene [K42.9] Right inguinal hernia [K40.90] Inguinal hernia [K40.90] Patient Active Problem List   Diagnosis Date Noted   Inguinal hernia 10/02/2023   Decreased appetite 09/19/2023   Opioid-induced constipation 09/19/2023   Loss of weight 09/19/2023   Risk and functional assessment 09/02/2023   COVID-19 virus infection 07/30/2023   Chronic respiratory failure with hypoxia (HCC) 05/07/2023   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   Umbilical hernia without obstruction or gangrene 04/16/2023   Atopic dermatitis 02/17/2023   Seborrheic dermatitis 02/17/2023   Myalgia 12/24/2022   Tension type headache 11/05/2022   Rhinorrhea 11/05/2022   Bronchitis 07/09/2022   Urinary frequency 06/04/2022   Vitamin D  deficiency 02/25/2022   Small intestinal bacterial overgrowth (SIBO) 11/21/2021   Hyperlipidemia 10/14/2021   Administration of long-term prophylactic antibiotics 08/28/2021   Anxiety and depression 05/01/2021   Cramping of hands 05/01/2021   Physical deconditioning 04/16/2021   Insomnia 03/28/2021   Previously noted chronic colitis of the right colon on 2020 colonoscopy 12/15/2020   Change in bowel habits 12/14/2020   Generalized abdominal pain 12/14/2020   Globus sensation 12/14/2020   Irritable bowel syndrome with diarrhea 12/14/2020   Hypertension 12/02/2020   Aortic atherosclerosis (HCC) 11/22/2020   Dysphagia 05/22/2020   Polypharmacy 04/27/2020   Chronic pain syndrome 12/13/2019   Renal mass 06/01/2019   GERD (gastroesophageal reflux disease) 02/09/2017   Allergic rhinitis 08/12/2016   Chest wall pain 04/16/2015   Spondylosis, cervical, 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   Lung cancer (HCC) 07/03/2011   CAD  (coronary artery disease) 06/05/2011   Chronic headaches 04/08/2011   Abnormal CT of the chest 04/08/2011   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     Discharge Diagnoses:  Patient Active Problem List   Diagnosis Date Noted   Inguinal hernia 10/02/2023   Decreased appetite 09/19/2023   Opioid-induced constipation 09/19/2023   Loss of weight 09/19/2023   Risk and functional assessment 09/02/2023   COVID-19 virus infection 07/30/2023   Chronic respiratory failure with hypoxia (HCC) 05/07/2023   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   Umbilical hernia without obstruction or gangrene 04/16/2023   Atopic dermatitis 02/17/2023   Seborrheic dermatitis 02/17/2023   Myalgia 12/24/2022   Tension type headache 11/05/2022   Rhinorrhea 11/05/2022   Bronchitis 07/09/2022   Urinary frequency 06/04/2022   Vitamin D  deficiency 02/25/2022   Small intestinal bacterial overgrowth (SIBO) 11/21/2021   Hyperlipidemia 10/14/2021   Administration of long-term prophylactic antibiotics 08/28/2021   Anxiety and depression 05/01/2021   Cramping of hands 05/01/2021   Physical deconditioning 04/16/2021   Insomnia 03/28/2021   Previously noted chronic colitis of the right colon on 2020 colonoscopy 12/15/2020   Change in bowel habits 12/14/2020   Generalized abdominal pain 12/14/2020   Globus sensation 12/14/2020   Irritable bowel syndrome with diarrhea 12/14/2020   Hypertension 12/02/2020   Aortic atherosclerosis (HCC) 11/22/2020   Dysphagia 05/22/2020   Polypharmacy 04/27/2020   Chronic pain syndrome 12/13/2019   Renal mass 06/01/2019   GERD (gastroesophageal reflux disease) 02/09/2017   Allergic rhinitis 08/12/2016   Chest wall pain 04/16/2015   Spondylosis, cervical, 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   Lung cancer (HCC) 07/03/2011   CAD (coronary artery disease) 06/05/2011   Chronic headaches 04/08/2011   Abnormal CT of the chest 04/08/2011   COPD with emphysema (HCC)  04/08/2011    Operations:  Procedure(s): REPAIR, HERNIA, INGUINAL, LAPAROSCOPIC REPAIR, HERNIA, UMBILICAL, ADULT  Admission Condition: fair  Discharged Condition: fair  Indication for Admission: Right inguinal and umbilical hernias  Hospital Course: Laparoscopic right inguinal hernia repair with mesh and primary closure of umbilical hernia defect.  Due to multiple medical comorbidities and home oxygen , patient stayed inpatient till discharge on 10/05/23.   Consults: Pulmonary  Significant Diagnostic Studies: None  Treatments: surgery: As above  Disposition: Home  Patient Instructions:  Allergies as of 10/05/2023       Reactions   Ipratropium-albuterol  Other (See Comments)   Makes the patient jittery   Isosorbide  Other (See Comments)   Worsened the feeling of chest pain        Medication List     STOP taking these medications    BC HEADACHE POWDER PO       TAKE these medications    albuterol  108 (90 Base) MCG/ACT inhaler Commonly known as: VENTOLIN  HFA INHALE 2 PUFFS BY MOUTH EVERY 6 HOURS AS NEEDED FOR WHEEZING OR SHORTNESS OF BREATH   azithromycin  250 MG tablet Commonly known as: ZITHROMAX  Take 1 tablet (250 mg total) by mouth daily.   cefUROXime  250 MG tablet Commonly known as: CEFTIN  Take 1 tablet (250 mg total) by mouth 2 (two) times daily with a meal.   doxycycline  100 MG tablet Commonly known as: VIBRA -TABS Take 1 tablet (100 mg total) by mouth 2 (two) times daily.   escitalopram  10 MG tablet Commonly known as: Lexapro  Take 1 tablet (10 mg total) by mouth daily.   fluticasone  50 MCG/ACT nasal spray Commonly known as: FLONASE  Use 2 spray(s) in each nostril once daily   guaiFENesin -dextromethorphan  100-10 MG/5ML syrup Commonly known as: ROBITUSSIN DM Take 5 mLs by mouth every 4 (four) hours as needed for cough. Notes to patient: Over the counter medication   hydrocortisone  cream 1 % Apply 1 Application topically 2 (two) times daily as needed for itching.    hydrOXYzine  25 MG tablet Commonly known as: ATARAX  Take 1 tablet (25 mg total) by mouth every 6 (six) hours as needed for anxiety.   ipratropium 0.03 % nasal spray Commonly known as: ATROVENT  Place 2 sprays into both nostrils every 12 (twelve) hours.   ipratropium-albuterol  0.5-2.5 (3) MG/3ML Soln Commonly known as: DUONEB Take 3 mLs by nebulization every 6 (six) hours as needed (for shortness of breath or wheezing).   ketoconazole  2 % shampoo Commonly known as: NIZORAL  Apply 1 Application topically every three (3) days as needed for irritation.   lidocaine  5 % Commonly known as: Lidoderm  Place 1 patch unto chest wall. Remove & Discard patch within 12 hours.   losartan  25 MG tablet Commonly known as: COZAAR  Take 0.5 tablets (12.5 mg total) by mouth daily.   mesalamine  1.2 g EC tablet Commonly known as: LIALDA  Take 4.8 g by mouth daily.   metoCLOPramide  5 MG tablet Commonly known as: REGLAN  Take 1 tablet (5 mg total) by mouth every 8 (eight) hours as needed for nausea.   Movantik  25 MG Tabs tablet Generic drug: naloxegol  oxalate Take 25 mg by mouth daily.   nitroGLYCERIN  0.4 MG SL tablet Commonly known as: NITROSTAT  Place 1 tablet (0.4 mg total) under the tongue every 5 (five) minutes as needed for chest pain.   omeprazole  40 MG capsule Commonly known as: PRILOSEC Take 1 capsule (40 mg total) by mouth daily.   oxyCODONE -acetaminophen  5-325 MG tablet Commonly known as: Percocet Take 1 tablet by  mouth every 4 (four) hours as needed for severe pain (pain score 7-10).   OXYGEN  Inhale 2 L/min into the lungs continuous.   predniSONE  10 MG tablet Commonly known as: DELTASONE  Take 1 tablet (10 mg total) by mouth See admin instructions. Take 10 mg by mouth every three days with food What changed: Another medication with the same name was added. Make sure you understand how and when to take each.   predniSONE  20 MG tablet Commonly known as: DELTASONE  Take 1 tablet (20  mg total) by mouth daily with breakfast. What changed: You were already taking a medication with the same name, and this prescription was added. Make sure you understand how and when to take each.   predniSONE  20 MG tablet Commonly known as: DELTASONE  Take 1 tablet (20 mg total) by mouth daily with breakfast. What changed: You were already taking a medication with the same name, and this prescription was added. Make sure you understand how and when to take each. Notes to patient: Take for 5 days, then go back to 10 mg dose of Prednisone    Qulipta  30 MG Tabs Generic drug: Atogepant  Take 30 mg by mouth daily.   Restasis MultiDose 0.05 % ophthalmic emulsion Generic drug: cycloSPORINE Place 1 drop into the left eye 2 (two) times daily as needed (dry/irritated eyes).   rosuvastatin  20 MG tablet Commonly known as: CRESTOR  Take 1 tablet (20 mg total) by mouth daily.   Trelegy Ellipta  200-62.5-25 MCG/ACT Aepb Generic drug: Fluticasone -Umeclidin-Vilant Inhale 1 puff into the lungs daily. INHALE 1 PUFF INTO LUNGS ONCE DAILY; Strength: 200-62.5-25 MCG/ACT What changed: Another medication with the same name was added. Make sure you understand how and when to take each.   Trelegy Ellipta  200-62.5-25 MCG/ACT Aepb Generic drug: Fluticasone -Umeclidin-Vilant Inhale 1 puff into the lungs daily. What changed: You were already taking a medication with the same name, and this prescription was added. Make sure you understand how and when to take each.        Activity: no heavy lifting for 4 weeks Diet: regular diet Wound Care: keep wound clean and dry  Follow-up:  With Dr. Lyndel  Signed: Deward PARAS Cailah Reach [facility] Bariat[facility] Minimally In[facility], [place]   10/08/2023, 8:48 AM

## 2023-10-12 ENCOUNTER — Telehealth: Payer: Self-pay | Admitting: *Deleted

## 2023-10-12 NOTE — Telephone Encounter (Signed)
 [name] is returning [name]'s call.

## 2023-10-12 NOTE — Telephone Encounter (Signed)
 Call returned message left for Hadassah to call with the specifics that she will be working on with patient. Copied from CRM [id number]. Topic: Clinical - Home Health Verbal Orders >> Oct 12, 2023  4:35 PM Corin V wrote: Caller/Agency: [facility] Callback Number: [phone], can leave voicemail Service Requested: Physical Therapy Frequency: 2x3 then 1x5 Any new concerns about the patient? Yes

## 2023-10-13 NOTE — Telephone Encounter (Signed)
 RTC to [facility].  Message left that Clinics had returned her call.  Need to know the reasons for the PT.

## 2023-10-13 NOTE — Telephone Encounter (Signed)
 RTC from Hadassah explained to her that patient will need a visit in the office prior to getting orders for PT as this office is the Primary.  Hadassah stated that the evaluation has been done for the patient and will await the official Verbal Orders for the patient from the Clinics.  Patient has not made an appointment for the hospital follow up as of yet. Call to patient-message was left that the Clinics had called to schedule an appointment in the Clinics so that the PT can be done.

## 2023-10-18 NOTE — Progress Notes (Unsigned)
 Established Patient Office Visit  Subjective   Patient ID: [name], male    DOB: April 10, 1955  Age: 68 y.o. MRN: [id number]  Chief Complaint  Patient presents with   Transitions Of Care    HFU   Pt reports still having pain ans surgery site     [name] is a 68 y.o. who presents to the clinic for a hospitalization follow up. Please see problem based assessment and plan for additional details.   Patient was hospitalized from 8/29 until 9/1 after he underwent a laparoscopic right inguinal hernia repair.  During his hospitalization, patient reported difficulty breathing and was seen by pulmonology inpatient.  Pulmonology increased patient's prednisone  to 20 mg for 5 days.   Since he was discharged, patient endorses abdominal discomfort from the surgical procedure.  He denies changes in bowel habits, nausea, or vomiting.  He has not followed up with his surgeon yet.    In regards to his shortness of breath while hospitalized, he reports feeling better today.  He denied shortness of breath or wheezing today.  He is currently on a regimen of prednisone  20 mg every 3 days along with a alternating antibiotic regimen of azithromycin  for 1 week a 1 month, followed by doxycycline  for 1 week a month, followed by cefuroxime  for 1 week the following month.  In addition to discussing his hospitalization, we discussed some medication interactions that were flagged.  It appears that azithromycin  and hydroxyzine  combined together increase the risk of QTc prolongation.  And omeprazole  with cefuroxime  decreases the absorption of cefuroxime .   Medication reconciliation was performed today.   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   Umbilical hernia without obstruction or gangrene 04/16/2023   Atopic dermatitis 02/17/2023   Seborrheic dermatitis 02/17/2023   Myalgia 12/24/2022   Tension type headache 11/05/2022   Rhinorrhea 11/05/2022   Bronchitis 07/09/2022   Urinary frequency 06/04/2022   Vitamin D  deficiency 02/25/2022   Small intestinal bacterial overgrowth (SIBO) 11/21/2021   Hyperlipidemia 10/14/2021   Administration of long-term prophylactic antibiotics 08/28/2021   Anxiety and depression 05/01/2021   Cramping of hands 05/01/2021   Physical deconditioning 04/16/2021   Insomnia 03/28/2021   Previously noted chronic colitis of the right colon on 2020 colonoscopy 12/15/2020   Change in bowel habits 12/14/2020   Generalized abdominal pain 12/14/2020   Globus sensation 12/14/2020   Irritable bowel syndrome with diarrhea 12/14/2020   Hypertension 12/02/2020   Aortic atherosclerosis (HCC) 11/22/2020   Dysphagia 05/22/2020   Polypharmacy 04/27/2020   Chronic pain syndrome 12/13/2019   Renal mass 06/01/2019   GERD (gastroesophageal reflux disease) 02/09/2017   Allergic rhinitis 08/12/2016   Chest wall pain 04/16/2015   Spondylosis, cervical, 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   Lung cancer (HCC) 07/03/2011   CAD (coronary artery disease) 06/05/2011   Chronic headaches 04/08/2011   Abnormal CT of the chest 04/08/2011   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   Constitutional:      General: He is not in acute distress.    Appearance: He is not ill-appearing, toxic-appearing or diaphoretic.  Cardiovascular:     Rate and Rhythm: Normal rate and regular rhythm.     Heart sounds: No murmur heard. Pulmonary:     Effort: Pulmonary effort is normal. No respiratory distress.     Breath sounds: Normal breath sounds. No wheezing or rales.     Comments: On chronic 2L of supplemental oxygen , no tachypnea   Musculoskeletal:     Right lower leg: No edema.     Left lower leg: No edema.  Skin:    General: Skin is warm and dry.  Neurological:     Mental Status: He is alert.  Psychiatric:        Mood and Affect: Mood and affect normal.      No results found for any visits on 10/19/23.  Last metabolic panel Lab Results  Component Value Date   GLUCOSE 87 09/22/2023   NA 138 09/22/2023   K 3.7 09/22/2023   CL 104 09/22/2023   CO2 25 09/22/2023   BUN 9 09/22/2023   CREATININE 1.18 10/02/2023   GFRNONAA >60 10/02/2023   CALCIUM  9.2 09/22/2023   PHOS 3.3 02/05/2022   PROT 6.8 08/31/2023   ALBUMIN  3.5 08/31/2023   LABGLOB 2.4 04/30/2021   AGRATIO 1.8 04/30/2021   BILITOT 0.4 08/31/2023   ALKPHOS 118 08/31/2023   AST 11 (L) 08/31/2023   ALT 6 08/31/2023   ANIONGAP 9 09/22/2023   Last lipids Lab Results  Component Value Date   CHOL 198 09/17/2022   HDL 80 09/17/2022   LDLCALC 93 09/17/2022   TRIG 145 09/17/2022   CHOLHDL 2.5 09/17/2022   Last hemoglobin A1c No results found for: HGBA1C    The ASCVD Risk score (Arnett DK, et al., 2019) failed to calculate for the following reasons:   Risk score cannot be calculated because patient has a medical history suggesting prior/existing ASCVD    Assessment & Plan:   Problem List Items Addressed This Visit       Respiratory   COPD with emphysema (HCC)   During his hospitalization, patient reported difficulty breathing and was seen by pulmonology inpatient.  Pulmonology increased  patient's prednisone  to 20 mg for 5 days. He reports feeling better today.  He denied shortness of breath or wheezing today.  He is currently on a regimen of prednisone  20 mg every 3 days along with a alternating antibiotic regimen of azithromycin  for 1 week a 1 month, followed by doxycycline  for 1 week a month, followed by cefuroxime  for 1 week the following month.  On exam, I was unable to appreciate increased respiratory effort on chronic 2L of supplemental oxygen .  Lungs are clear to auscultate bilaterally. Plan: -Continue regimen of prednisone  20 mg every 3 days along with a alternating antibiotic regimen of azithromycin  for 1 week a 1 month, followed by doxycycline  for 1 week a month, followed by cefuroxime  for 1 week the following month. -Patient has a follow up with pulmonology in October  Other   Medication management   In addition to discussing his hospitalization, we discussed some medication interactions that were flagged.  It appears that azithromycin  and hydroxyzine  combined together increase the risk of QTc prolongation.  And omeprazole  with cefuroxime  decreases the absorption of cefuroxime .   An EKG was obtained with a Qtc of 412.  We discussed the risks and benefits of continuing the medication regimen stated above. Patient understands that his risk of Qtc prolongation is present while taking both azithromycin  and hydroxyzine  as well omeprazole  with cefuroxime  and wishes to continue his current regimen.   Plan: -Patient will limit use of hydroxyzine  during the week he takes azithromycin  -Patient will take omeprazole  in the morning and cefuroxime  later in the afternoon.       Right inguinal hernia   Patient was hospitalized from 8/29 until 9/1 after he underwent a laparoscopic right inguinal hernia repair.  During his hospitalization, patient reported difficulty breathing and was seen by pulmonology inpatient.  Pulmonology increased patient's prednisone  to 20 mg for 5  days. Since he was discharged, patient endorses abdominal discomfort from the surgical procedure.  He denies changes in bowel habits, nausea, or vomiting.  He has not followed up with his surgeon yet.    On exam, his abdomen is nontender to palpation.  He has 3 surgical site incisions that are healed without erythema or discharge present.  Plan: - Patient was instructed to ensure follow-up with general surgery       Other Visit Diagnoses       At risk for prolonged QT interval syndrome    -  Primary   Relevant Orders   EKG 12-Lead (Completed)       Return for Chronic conditions .    Damien Lease, DO

## 2023-10-19 ENCOUNTER — Encounter: Payer: Self-pay | Admitting: Student

## 2023-10-19 ENCOUNTER — Telehealth: Admitting: *Deleted

## 2023-10-19 ENCOUNTER — Telehealth: Payer: Self-pay | Admitting: *Deleted

## 2023-10-19 ENCOUNTER — Other Ambulatory Visit: Payer: Self-pay

## 2023-10-19 ENCOUNTER — Ambulatory Visit: Payer: Self-pay | Admitting: Student

## 2023-10-19 VITALS — BP 135/62 | HR 59 | Temp 98.1°F | Ht 69.0 in | Wt 148.6 lb

## 2023-10-19 DIAGNOSIS — Z9189 Other specified personal risk factors, not elsewhere classified: Secondary | ICD-10-CM

## 2023-10-19 DIAGNOSIS — J439 Emphysema, unspecified: Secondary | ICD-10-CM | POA: Diagnosis not present

## 2023-10-19 DIAGNOSIS — Z7952 Long term (current) use of systemic steroids: Secondary | ICD-10-CM | POA: Diagnosis not present

## 2023-10-19 DIAGNOSIS — Z79899 Other long term (current) drug therapy: Secondary | ICD-10-CM

## 2023-10-19 DIAGNOSIS — Z9981 Dependence on supplemental oxygen: Secondary | ICD-10-CM | POA: Diagnosis not present

## 2023-10-19 DIAGNOSIS — K4090 Unilateral inguinal hernia, without obstruction or gangrene, not specified as recurrent: Secondary | ICD-10-CM

## 2023-10-19 DIAGNOSIS — G8918 Other acute postprocedural pain: Secondary | ICD-10-CM | POA: Diagnosis not present

## 2023-10-19 DIAGNOSIS — Z8719 Personal history of other diseases of the digestive system: Secondary | ICD-10-CM

## 2023-10-19 DIAGNOSIS — Z792 Long term (current) use of antibiotics: Secondary | ICD-10-CM

## 2023-10-19 MED ORDER — ESCITALOPRAM OXALATE 10 MG PO TABS: 10.0000 mg | ORAL_TABLET | Freq: Every day | ORAL | 2 refills | Status: AC

## 2023-10-19 NOTE — Patient Instructions (Signed)
 Thank you, Mr.Nasser P Mallis for allowing us  to provide your care today. Today we discussed your recent hospitalization.  Please note the medication interactions we spoke about: For the Azithromycin  and hydroxyzine : Do NOT take the hydroxyzine   during the week you are taking azithromycin . There is a risk of changes in the EKG that may lead to cardiac arrest, although this is a low risk of happening.  If we take the medications at separate times, we can limit that risk.   When you are taking the cefuroxime , please take the omeprazole  early in the morning and the cefuroxime  later in the afternoon.   I have ordered the following medication/changed the following medications:   Stop the following medications: Medications Discontinued During This Encounter  Medication Reason   guaiFENesin -dextromethorphan  (ROBITUSSIN DM) 100-10 MG/5ML syrup    hydrocortisone  cream 1 %    escitalopram  (LEXAPRO ) 10 MG tablet Reorder     Start the following medications: Meds ordered this encounter  Medications   escitalopram  (LEXAPRO ) 10 MG tablet    Sig: Take 1 tablet (10 mg total) by mouth daily.    Dispense:  30 tablet    Refill:  2     Follow up: One month for chronic conditions      Should you have any questions or concerns please call the internal medicine clinic at [phone].     Please note that our late policy has changed.  If you are more than 15 minutes late to your appointment, you may be asked to reschedule your appointment.  Dr. Kandis, D.O. [facility]
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
## 2023-10-20 NOTE — Assessment & Plan Note (Signed)
 During his hospitalization, patient reported difficulty breathing and was seen by pulmonology inpatient.  Pulmonology increased patient's prednisone  to 20 mg for 5 days. He reports feeling better today.  He denied shortness of breath or wheezing today.  He is currently on a regimen of prednisone  20 mg every 3 days along with a alternating antibiotic regimen of azithromycin  for 1 week a 1 month, followed by doxycycline  for 1 week a month, followed by cefuroxime  for 1 week the following month.  On exam, I was unable to appreciate increased respiratory effort on chronic 2L of supplemental oxygen .  Lungs are clear to auscultate bilaterally. Plan: -Continue regimen of prednisone  20 mg every 3 days along with a alternating antibiotic regimen of azithromycin  for 1 week a 1 month, followed by doxycycline  for 1 week a month, followed by cefuroxime  for 1 week the following month. -Patient has a follow up with pulmonology in October

## 2023-10-20 NOTE — Assessment & Plan Note (Signed)
 Patient was hospitalized from 8/29 until 9/1 after he underwent a laparoscopic right inguinal hernia repair.  During his hospitalization, patient reported difficulty breathing and was seen by pulmonology inpatient.  Pulmonology increased patient's prednisone  to 20 mg for 5 days. Since he was discharged, patient endorses abdominal discomfort from the surgical procedure.  He denies changes in bowel habits, nausea, or vomiting.  He has not followed up with his surgeon yet.    On exam, his abdomen is nontender to palpation.  He has 3 surgical site incisions that are healed without erythema or discharge present.  Plan: - Patient was instructed to ensure follow-up with general surgery

## 2023-10-20 NOTE — Assessment & Plan Note (Signed)
 In addition to discussing his hospitalization, we discussed some medication interactions that were flagged.  It appears that azithromycin  and hydroxyzine  combined together increase the risk of QTc prolongation.  And omeprazole  with cefuroxime  decreases the absorption of cefuroxime .   An EKG was obtained with a Qtc of 412.  We discussed the risks and benefits of continuing the medication regimen stated above. Patient understands that his risk of Qtc prolongation is present while taking both azithromycin  and hydroxyzine  as well omeprazole  with cefuroxime  and wishes to continue his current regimen.   Plan: -Patient will limit use of hydroxyzine  during the week he takes azithromycin  -Patient will take omeprazole  in the morning and cefuroxime  later in the afternoon.

## 2023-10-21 ENCOUNTER — Telehealth: Payer: Self-pay | Admitting: Gastroenterology

## 2023-10-21 MED ORDER — METOCLOPRAMIDE HCL 5 MG PO TABS: 5.0000 mg | ORAL_TABLET | Freq: Three times a day (TID) | ORAL | 0 refills | Status: AC | PRN

## 2023-10-21 NOTE — Telephone Encounter (Signed)
 Patient aware that metoclopramide  5mg  one tablet every 8 hours as needed was sent to pharmacy as requested.

## 2023-10-21 NOTE — Telephone Encounter (Signed)
 Inbound call from patient requesting a refill on his medication called MetoCLOPrmide for 5MG  tablet. Patient is also requesting a call back to advise him that the medication has been sent over. Please advise.

## 2023-10-22 ENCOUNTER — Telehealth: Admitting: *Deleted

## 2023-10-28 ENCOUNTER — Other Ambulatory Visit: Payer: Self-pay | Admitting: *Deleted

## 2023-10-28 NOTE — Patient Instructions (Signed)
 Visit Information  Thank you for taking time to visit with me today. Please don't hesitate to contact me if I can be of assistance to you before our next scheduled appointment.  Your next care management appointment is by telephone on 11/24/2023 at 1:30 pm  Please call the care guide team at [phone] if you need to cancel, schedule, or reschedule an appointment.   Please call the Suicide and Crisis Lifeline: 988 call the [place]  National Suicide Prevention Lifeline: 480-474-5125 or TTY: 407 126 4573 TTY 240-236-6989) to talk to a trained counselor call 1-800-273-TALK (toll free, 24 hour hotline) if you are experiencing a Mental Health or Behavioral Health Crisis or need someone to talk to.  Olam Ku, RN, BSN [place]  [facility], [facility] Health RN Care Manager Direct Dial: [phone]  Fax: [phone]
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
## 2023-10-29 ENCOUNTER — Telehealth: Payer: Self-pay

## 2023-10-29 NOTE — Telephone Encounter (Signed)
 COPD : RN Telephone Call   Patient ID: [name], male    DOB: 12-29-1955  Age: 68 y.o. MRN: [id number]  [name] is a 68 y.o. who was called about COPD management.   Patient reports that they are taking the following medications for COPD: 9 FLONASE  / ATROVENT  / DUNEB / PREDNISONE  / TRELEGY / VENTLON)  Patient reports they DO  have a prior pulmonologist who they follow:  [facility] ( DR BYRUM )  Please document smoking status, how many packs? Ready to quit ?  - Smoking Hx: He quit smoking approximately 8 year ago.   mMRC (Modified Medical Research Council) Dyspnea Scale   Ask the following questions and please check the box that describes the most.   Choose ONE>   []  Grade 0: I only get breathless with strenuous exercise   []  Grade 1: I get short of breath when hurry on level ground or walking up a slight hill   []  Grade 2: On level Ground, I walk slower than people of the same age because of breathlessness or have to stop for breath when walking my own pace   []  Grade 3: I Stop for breath after walking about 100 yards or after a few minutes on level ground   []  Grade 4: I am too breathless to leave the house, or I am breathless when dressing   ( PT REPORTS HE CAN'T PICK JUST ONE DUE TO HE FALLS IN ALL THE CATEGORIES )   CAT (COPD Assessment Test)   Please ask each of the following questions and select between 0-5 based on what describes the patient most accurately.  On a scale 0 to 5, do you never cough or cough all the time: 3  4 5  (My chest feels very tight) 5 (When I walk up a hill or one flight of stairs I am very breathless) 5 (I am very limited doing any activities at home  3 0 (I sleep soundly) On a scale 0 to 5, how would you describe your energy?: 4  Total: 29

## 2023-10-31 NOTE — Progress Notes (Signed)
 Internal Medicine Clinic Attending  Case discussed with the resident at the time of the visit.  We reviewed the resident's history and exam and pertinent patient test results.  I agree with the assessment, diagnosis, and plan of care documented in the resident's note.

## 2023-11-01 ENCOUNTER — Other Ambulatory Visit: Payer: Self-pay | Admitting: Emergency Medicine

## 2023-11-04 ENCOUNTER — Other Ambulatory Visit: Payer: Self-pay

## 2023-11-04 MED ORDER — LOSARTAN POTASSIUM 25 MG PO TABS: 12.5000 mg | ORAL_TABLET | Freq: Every day | ORAL | 1 refills | Status: AC

## 2023-11-04 NOTE — Telephone Encounter (Signed)
 Medication sent to pharmacy

## 2023-11-05 ENCOUNTER — Telehealth: Payer: Self-pay

## 2023-11-05 DIAGNOSIS — C3490 Malignant neoplasm of unspecified part of unspecified bronchus or lung: Secondary | ICD-10-CM

## 2023-11-05 NOTE — Telephone Encounter (Signed)
 Copied from CRM [id number]. Topic: Clinical - Order For Equipment >> Nov 05, 2023  2:36 PM [name] wrote: Reason for CRM: pt is not happy w/ [employer]. His POC is acting up, making a knocking noise, unreliable.  [employer] does not act like they want to help him. Pt would like to switch to [facility].  He spoke w/ them today. They said if you can fax a new order today, for a POC, they can have it out to him by Monday.   I called and spoke to pt. Pt verified he wants to start using [facility] as the machine he has from [employer] stopped working and he is no longer happy with their company as they did not help him. Routing to Dr Kara to advise as Dr Shelah is out of office.

## 2023-11-05 NOTE — Telephone Encounter (Signed)
 Ok to fax order to [facility] with new POC order.  Thanks, JD

## 2023-11-06 ENCOUNTER — Encounter: Payer: Self-pay | Admitting: Gastroenterology

## 2023-11-06 ENCOUNTER — Ambulatory Visit (INDEPENDENT_AMBULATORY_CARE_PROVIDER_SITE_OTHER): Admitting: Gastroenterology

## 2023-11-06 VITALS — BP 122/72 | HR 60 | Ht 69.0 in | Wt 149.0 lb

## 2023-11-06 DIAGNOSIS — R198 Other specified symptoms and signs involving the digestive system and abdomen: Secondary | ICD-10-CM

## 2023-11-06 DIAGNOSIS — R110 Nausea: Secondary | ICD-10-CM

## 2023-11-06 DIAGNOSIS — R1084 Generalized abdominal pain: Secondary | ICD-10-CM

## 2023-11-06 DIAGNOSIS — K529 Noninfective gastroenteritis and colitis, unspecified: Secondary | ICD-10-CM

## 2023-11-06 DIAGNOSIS — R197 Diarrhea, unspecified: Secondary | ICD-10-CM

## 2023-11-06 DIAGNOSIS — K5903 Drug induced constipation: Secondary | ICD-10-CM

## 2023-11-06 DIAGNOSIS — K219 Gastro-esophageal reflux disease without esophagitis: Secondary | ICD-10-CM

## 2023-11-06 NOTE — Patient Instructions (Addendum)
 Your provider has requested that you go to the basement level for lab work before leaving today. Press B on the elevator. The lab is located at the first door on the left as you exit the elevator.  Due to recent changes in healthcare laws, you may see the results of your imaging and laboratory studies on MyChart before your provider has had a chance to review them.  We understand that in some cases there may be results that are confusing or concerning to you. Not all laboratory results come back in the same time frame and the provider may be waiting for multiple results in order to interpret others.  Please give us  48 hours in order for your provider to thoroughly review all the results before contacting the office for clarification of your results.   Please return the stool test to the lab on Monday, 10/06.  Continue Mesalamine  4.8 G daily.   Thank you for trusting me with your gastrointestinal care!   Camie Furbish, PA-C  _______________________________________________________  If your blood pressure at your visit was 140/90 or greater, please contact your primary care physician to follow up on this.  _______________________________________________________  If you are age 75 or older, your body mass index should be between 23-30. Your Body mass index is 22 kg/m. If this is out of the aforementioned range listed, please consider follow up with your Primary Care Provider.  If you are age 72 or younger, your body mass index should be between 19-25. Your Body mass index is 22 kg/m. If this is out of the aformentioned range listed, please consider follow up with your Primary Care Provider.   ________________________________________________________  The [facility] providers would like to encourage you to use MYCHART to communicate with providers for non-urgent requests or questions.  Due to long hold times on the telephone, sending your provider a message by [facility] may be a faster and more  efficient way to get a response.  Please allow 48 business hours for a response.  Please remember that this is for non-urgent requests.  _______________________________________________________  [facility] is using a team-based approach to care.  Your team is made up of your doctor and two to three APPS. Our APPS (Nurse Practitioners and Physician Assistants) work with your physician to ensure care continuity for you. They are fully qualified to address your health concerns and develop a treatment plan. They communicate directly with your gastroenterologist to care for you. Seeing the Advanced Practice Practitioners on your physician's team can help you by facilitating care more promptly, often allowing for earlier appointments, access to diagnostic testing, procedures, and other specialty referrals.

## 2023-11-06 NOTE — Progress Notes (Signed)
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 stenosis is present.  5. The inferior vena cava is dilated in size with > 50% respiratory variability, suggesting right atrial pressure of 8 mmHg.  Assessment/Plan:   Chronic colitis Generalized abdominal pain Alternating constipation and diarrhea Patient seen today for follow-up.  Continues to have alternating bowel habits with constipation and diarrhea in the setting of chronic opioid use  and chronic colitis. Taking mesalamine  daily.  Taking hydrocodone  on as needed for severe musculoskeletal pain, and otherwise taking ibuprofen .  Continues on PPI. Since he is not taking hydrocodone  daily he has not started Movantik .    Since last visit did have hernia repair surgery 10/02/2023.  No anesthesia complications noted.  Postop has been having some rectal discomfort with burning and itching and was prescribed topical hydrocortisone  by surgery 11/04/2023.    Still has not returned fecal calprotectin but says he will turn this in on Monday. We discussed setting up hospital-based colonoscopy for further evaluation of his ongoing symptoms in setting of chronic colitis.  He was not agreeable to scheduling this today.  Does not want to have a colonoscopy because he does not want to go through the prep.  He says he will turn in fecal calprotectin and open to further  discussion pending findings.  Requests I call him with results.  - Continue 4.8 g mesalamine  daily - Continue omeprazole  40 mg daily - Continue Reglan  as needed - Return fecal calprotectin.  Will put in new order for this today and give patient a new kit. - Consider hospital-based colonoscopy+/- EGD for ongoing GI issues - Will discuss use of Movantik  with Dr. Wilhelmenia - Follow-up 6 to 8 weeks   Camie Furbish, PA-C [facility] 11/06/2023, 3:47 PM  Patient Care Team: Nooruddin, Saad, MD as PCP - General (Internal Medicine) Ladona Laura Radilla, MD as PCP - Cardiology (Cardiology) Sherrod Sherrod, MD (Hematology and Oncology) Jori Small, FNP as Referring Physician (Nurse Practitioner) Prentis Duwaine BROCKS, RN as Oncology Nurse Navigator Alvia Olam BIRCH, RN as [facility] Care Management

## 2023-11-07 ENCOUNTER — Other Ambulatory Visit: Payer: Self-pay | Admitting: Student

## 2023-11-07 DIAGNOSIS — R1084 Generalized abdominal pain: Secondary | ICD-10-CM

## 2023-11-07 DIAGNOSIS — R110 Nausea: Secondary | ICD-10-CM

## 2023-11-08 ENCOUNTER — Encounter: Payer: Self-pay | Admitting: Gastroenterology

## 2023-11-09 ENCOUNTER — Other Ambulatory Visit

## 2023-11-09 ENCOUNTER — Other Ambulatory Visit: Payer: Self-pay | Admitting: Student

## 2023-11-09 DIAGNOSIS — R110 Nausea: Secondary | ICD-10-CM

## 2023-11-09 DIAGNOSIS — K529 Noninfective gastroenteritis and colitis, unspecified: Secondary | ICD-10-CM

## 2023-11-09 DIAGNOSIS — R1084 Generalized abdominal pain: Secondary | ICD-10-CM

## 2023-11-09 DIAGNOSIS — K5903 Drug induced constipation: Secondary | ICD-10-CM

## 2023-11-09 DIAGNOSIS — K219 Gastro-esophageal reflux disease without esophagitis: Secondary | ICD-10-CM

## 2023-11-09 DIAGNOSIS — R197 Diarrhea, unspecified: Secondary | ICD-10-CM

## 2023-11-09 NOTE — Telephone Encounter (Signed)
 Order placed.   [facility] please sent to [facility]. Thanks!

## 2023-11-09 NOTE — Telephone Encounter (Signed)
 Medication sent to pharmacy

## 2023-11-09 NOTE — Progress Notes (Signed)
 Attending Physician's Attestation   I have reviewed the chart.   I agree with the Advanced Practitioner's note, impression, and recommendations with any updates as below. If the patient is using opioids regularly (but not daily) he may not have as much effectiveness with Movantik .  He could trial this for 4 weeks to see if it makes a difference but if it does not then opioid-induced constipation seems less likely at that point and then we can stop the medication.  Will leave that up to him to decide.  Again, agree fecal calprotectin should be brought and consideration of updated colonoscopy, but we can only do what our patients will be open to.  He is at increased risk for potential complications from an anesthesia standpoint, so we do want to be mindful of that as well due to his underlying lung status.  Will see what the fecal calprotectin returns.   Aloha Finner, MD [facility] Advanced Endoscopy Office # [phone]

## 2023-11-09 NOTE — Telephone Encounter (Signed)
 Duplicate medication refill request has already been addressed.

## 2023-11-11 LAB — CALPROTECTIN, FECAL: Calprotectin, Fecal: 328 ug/g — ABNORMAL HIGH (ref 0–120)

## 2023-11-11 NOTE — Telephone Encounter (Signed)
 Called and spoke with the pt. Advised of note from [employer]. Pt stated he does not like [employer] and they treat him unfairly.  I advised due to insurance he could not switch companies due to insurance.   Provided pt with [employer] phone number for further issues with POC.  I advised pt how to properly carry POC and clean it.  Closing encounter.

## 2023-11-11 NOTE — Telephone Encounter (Signed)
 Per Tommye from [place]- Patient currently has a POC and nothing is wrong with it; he had a maintenance done on 10/07/23 and 10/30/23. Patient machine has been very dirty during these maintenances, and he is carrying it on its side which is causing it to sound loud which is the incorrect way to carry it.

## 2023-11-11 NOTE — Telephone Encounter (Signed)
Sent to [facility]  

## 2023-11-11 NOTE — Telephone Encounter (Signed)
 Per [name] with [employer]- This PT is established with [employer] and has been with them over a year. Pt would have to wait till their 5 year mark to switch due to the way insurance bills.

## 2023-11-12 ENCOUNTER — Ambulatory Visit: Payer: Self-pay | Admitting: Gastroenterology

## 2023-11-16 ENCOUNTER — Other Ambulatory Visit: Payer: Self-pay

## 2023-11-16 MED ORDER — MESALAMINE ER 0.375 G PO CP24
1.5000 g | ORAL_CAPSULE | Freq: Every day | ORAL | 6 refills | Status: AC
Start: 2023-11-16 — End: 2023-12-25

## 2023-11-17 ENCOUNTER — Telehealth: Payer: Self-pay

## 2023-11-17 NOTE — Telephone Encounter (Signed)
 Pharmacy Patient Advocate Encounter   Received notification from CoverMyMeds that prior authorization for Mesalamine  ER 0.375GM er capsules is required/requested.   Insurance verification completed.   The patient is insured through [facility].   Per test claim: PA required; PA submitted to above mentioned insurance via Latent Key/confirmation #/EOC [id number] Status is pending

## 2023-11-17 NOTE — Telephone Encounter (Signed)
 Pharmacy Patient Advocate Encounter  Received fax from insurance company asking the following:   Does the patient have a history of failure, contraindication, or intolerance to:  Balsalazide Sulfasalazine tablet

## 2023-11-18 NOTE — Telephone Encounter (Signed)
 Inbound call from [facility] stating they have clinical questions they have for the prior auth and is going to be sending over a fax with the questions. The reference number is [id number]. Please advise.

## 2023-11-18 NOTE — Telephone Encounter (Signed)
 Patient may be on balsalazide 2250 mg daily. We can try that for now. Thanks. GM

## 2023-11-19 ENCOUNTER — Other Ambulatory Visit: Payer: Self-pay

## 2023-11-19 MED ORDER — BALSALAZIDE DISODIUM 750 MG PO CAPS: 2250.0000 mg | ORAL_CAPSULE | Freq: Every day | ORAL | 3 refills | Status: DC

## 2023-11-19 NOTE — Telephone Encounter (Signed)
 The prescription has been sent as ordered for PA

## 2023-11-23 ENCOUNTER — Encounter: Payer: Self-pay | Admitting: Student

## 2023-11-23 ENCOUNTER — Ambulatory Visit (INDEPENDENT_AMBULATORY_CARE_PROVIDER_SITE_OTHER): Admitting: Student

## 2023-11-23 VITALS — BP 146/93 | HR 80 | Ht 69.0 in | Wt 148.0 lb

## 2023-11-23 DIAGNOSIS — J439 Emphysema, unspecified: Secondary | ICD-10-CM | POA: Diagnosis not present

## 2023-11-23 DIAGNOSIS — E785 Hyperlipidemia, unspecified: Secondary | ICD-10-CM | POA: Diagnosis not present

## 2023-11-23 DIAGNOSIS — M4712 Other spondylosis with myelopathy, cervical region: Secondary | ICD-10-CM

## 2023-11-23 DIAGNOSIS — Z79899 Other long term (current) drug therapy: Secondary | ICD-10-CM

## 2023-11-23 DIAGNOSIS — J9611 Chronic respiratory failure with hypoxia: Secondary | ICD-10-CM | POA: Diagnosis not present

## 2023-11-23 DIAGNOSIS — I10 Essential (primary) hypertension: Secondary | ICD-10-CM

## 2023-11-23 DIAGNOSIS — Z87891 Personal history of nicotine dependence: Secondary | ICD-10-CM

## 2023-11-23 DIAGNOSIS — Z0000 Encounter for general adult medical examination without abnormal findings: Secondary | ICD-10-CM

## 2023-11-23 DIAGNOSIS — K529 Noninfective gastroenteritis and colitis, unspecified: Secondary | ICD-10-CM

## 2023-11-23 MED ORDER — DICLOFENAC SODIUM 1 % EX GEL: 4.0000 g | Freq: Four times a day (QID) | CUTANEOUS | 2 refills | Status: AC

## 2023-11-23 NOTE — Patient Instructions (Signed)
 Thank you so much for coming to the clinic today!   I have sent in that pain relief  cream. Let me look more into different medications to help with your cough and I will let you know!  If you have any questions please feel free to the call the clinic at anytime at [phone]. It was a pleasure seeing you!  Best, Dr. Celedonio Sortino

## 2023-11-24 ENCOUNTER — Telehealth: Payer: Self-pay | Admitting: *Deleted

## 2023-11-24 ENCOUNTER — Encounter: Payer: Self-pay | Admitting: *Deleted

## 2023-11-24 NOTE — Patient Instructions (Signed)
 [name] - I am sorry I was unable to reach you today for our scheduled appointment. I work with Nooruddin, Saad, MD and am calling to support your healthcare needs. Please contact me at [phone] at your earliest convenience. I look forward to speaking with you soon.   Thank you,   Olam Ku, RN, BSN [place]  [facility], [facility] RN Care Manager Direct Dial: [phone]  Fax: [phone]
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
## 2023-11-25 NOTE — Assessment & Plan Note (Signed)
 Recently hospitalized, has finished his steroid taper.  Per patient pulmonology would like him to be continued on antibiotics, he has an appointment with them on October 30.  Lungs unclear today, he is using his 2 L of oxygen .

## 2023-11-25 NOTE — Assessment & Plan Note (Signed)
 On losartan  25 mg.  Blood pressure elevated in clinic today, will discuss increasing his losartan  his next visit as he feels like there is a lot going on right now and does not want to make any medication changes at this time.

## 2023-11-25 NOTE — Assessment & Plan Note (Signed)
 Patient declined pneumococcal vaccine as well as influenza vaccine.

## 2023-11-25 NOTE — Assessment & Plan Note (Signed)
 Continuing his oxygen 

## 2023-11-25 NOTE — Assessment & Plan Note (Signed)
 Complaining of some pain in his left cervical area, for which he states altering gel helps him with it.  Will prescribe today, albeit pain does seem radicular in nature.  If this persist despite Voltaren  gel may need to consider neuropathic pain agents.

## 2023-11-25 NOTE — Assessment & Plan Note (Signed)
 Follows with GI, most recently started on colazal nd also mesalamine , deferring management to them.  Patient feels like his symptoms are improving

## 2023-11-25 NOTE — Assessment & Plan Note (Signed)
Continues rosuvastatin 20 mg.

## 2023-11-25 NOTE — Progress Notes (Signed)
 Internal Medicine Clinic Attending  Case discussed with the resident at the time of the visit.  We reviewed the resident's history and exam and pertinent patient test results.  I agree with the assessment, diagnosis, and plan of care documented in the resident's note.

## 2023-11-26 ENCOUNTER — Telehealth: Payer: Self-pay | Admitting: Gastroenterology

## 2023-11-26 NOTE — Telephone Encounter (Signed)
 Inbound call from patient stating after office visit on 11/06/23 he was advised to continue taking mesalamine , omeprazole  and Reglan  and patient is now stating he has been experiencing extreme abdominal pain and he's barely able to eat. Would like to speak to nurse and be advised on what to do. Requesting a call back  Please advise Thank you

## 2023-11-26 NOTE — Telephone Encounter (Signed)
 Spoke with pt. He reports that since he started taking the new medications he has had stomach pain. Pt believes these medications include Apriso  and Balsalazide.Describes pain as aching and burning. Rates 5/10 at this time. Other symptoms include decreased appetite, and severe nausea. Reports he also has diarrhea at times, however this morning his stool was more formed. He has tried Pepto for his pain, and this didn't seem to help. Did recommend Gaviscon. Will route to provider to review and advise further.

## 2023-11-27 NOTE — Telephone Encounter (Signed)
 Attempted to reach patient. No answer, left VM for patient to return call.

## 2023-11-27 NOTE — Telephone Encounter (Signed)
 Patient returning call. Please advise

## 2023-11-27 NOTE — Telephone Encounter (Signed)
 Returned call to pt. Discussed with pt only taking the Apriso . He verbalized understanding that he should stop taking the Balsalazide medication. Medication removed from list. Pt instructed to call us  back if his abd pain persists or worsens. Again verbalized understanding.

## 2023-11-27 NOTE — Telephone Encounter (Signed)
 Returned call to pt to get clarification on the medications that he is currently taking. Verified with pt that he is taking both Apriso  as well as Balsalazide. He reports that he was able to pick both medications up from the pharmacy on the same day. He picked them up around 11/19/23 and started taking 1 of each pill the next day. He did this for several days and then noticed his stomach started hurting pretty bad. He reported that it was hurting last night and this morning and he took Reglan  which provided minimal relief. His stomach does hurt more after eating.

## 2023-12-01 ENCOUNTER — Other Ambulatory Visit: Payer: Self-pay | Admitting: *Deleted

## 2023-12-01 NOTE — Patient Instructions (Signed)
 Visit Information  Thank you for taking time to visit with me today. Please don't hesitate to contact me if I can be of assistance to you before our next scheduled appointment.  Your next care management appointment is by telephone on 12/09/2023 at 1130  Please call the care guide team at [phone] if you need to cancel, schedule, or reschedule an appointment.   Please call the Suicide and Crisis Lifeline: 988 call the [place]  National Suicide Prevention Lifeline: 737 367 6947 or TTY: 234 879 6485 TTY 438 722 3682) to talk to a trained counselor call 1-800-273-TALK (toll free, 24 hour hotline) if you are experiencing a Mental Health or Behavioral Health Crisis or need someone to talk to.   Olam Ku, RN, BSN [place]  [facility], [facility] RN Care Manager Direct Dial: [phone]  Fax: [phone]
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
## 2023-12-02 ENCOUNTER — Telehealth: Payer: Self-pay | Admitting: *Deleted

## 2023-12-02 NOTE — Telephone Encounter (Signed)
 Copied from CRM [id number]. Topic: General - Other >> Dec 02, 2023 11:57 AM [name] wrote: Reason for CRM: Patient is calling in stating he has not seen [name] in a while and wanted to check in with her and see if [name] still was wanting to see him. Please advise patient.

## 2023-12-02 NOTE — Telephone Encounter (Signed)
 I called pt to see if he has an urgent concern - no answer; left message on pt's vm.

## 2023-12-03 ENCOUNTER — Ambulatory Visit: Admitting: Emergency Medicine

## 2023-12-03 ENCOUNTER — Encounter: Payer: Self-pay | Admitting: Emergency Medicine

## 2023-12-03 VITALS — BP 114/78 | HR 65 | Temp 97.8°F | Ht 69.0 in | Wt 152.8 lb

## 2023-12-03 DIAGNOSIS — C3490 Malignant neoplasm of unspecified part of unspecified bronchus or lung: Secondary | ICD-10-CM | POA: Diagnosis not present

## 2023-12-03 DIAGNOSIS — R9389 Abnormal findings on diagnostic imaging of other specified body structures: Secondary | ICD-10-CM

## 2023-12-03 DIAGNOSIS — J301 Allergic rhinitis due to pollen: Secondary | ICD-10-CM

## 2023-12-03 DIAGNOSIS — J9611 Chronic respiratory failure with hypoxia: Secondary | ICD-10-CM | POA: Diagnosis not present

## 2023-12-03 DIAGNOSIS — J439 Emphysema, unspecified: Secondary | ICD-10-CM

## 2023-12-03 DIAGNOSIS — R918 Other nonspecific abnormal finding of lung field: Secondary | ICD-10-CM

## 2023-12-03 MED ORDER — PREDNISONE 10 MG PO TABS: 10.0000 mg | ORAL_TABLET | Freq: Every day | ORAL | 11 refills | Status: DC

## 2023-12-03 NOTE — Assessment & Plan Note (Signed)
 He is having some difficulty with his POC, sometimes feels like it is not firing appropriately.  He has reviewed this with [employer] and they believe it is working the way it should.  He considered changing to [employer] but he cannot change DME companies within a 5-year window.  We will continue to work with [employer] to try to optimize.  He will continue his oxygen  at all time as ordered.

## 2023-12-03 NOTE — Assessment & Plan Note (Signed)
 Very severe obstruction with carina bronchitic symptoms.  He has had more green mucus and cough this month.  He is still on his rotating antibiotics.  I will have him start the next antibiotic cycle now (just a few days early).  He is currently on prednisone  20 mg every other day.  I will get him back to 10 mg once daily to see if we get better control of his daily symptoms especially wheezing  Continue your Trelegy once daily.  Rinse and gargle after using. Use your DuoNeb as needed for shortness of breath, wheezing, chest tightness We will change your prednisone  to 10 mg once daily Continue your rotating antibiotics as you have been taking them.  Go ahead and start your next antibiotic regimen now  Follow-up in our office in December 2026 after your CT chest so we can review those results together.

## 2023-12-03 NOTE — Progress Notes (Signed)
 Subjective:    Patient ID: [name], male    DOB: Jan 23, 1956, 68 y.o.   MRN: [id number]  COPD He complains of cough and shortness of breath. There is no wheezing. Pertinent negatives include no ear pain, fever, headaches, postnasal drip, rhinorrhea, sneezing, sore throat or trouble swallowing. His past medical history is significant for COPD.    ROV 12/03/2023 --follow-up visit for 69 year old male with very severe COPD with associated emphysema, history of adenocarcinoma with bilateral surgical resections.  He has chronic bronchitis, chronic hypoxemic respiratory failure. We have been managing him on Trelegy, rotating antibiotics (cefuroxime , doxycycline , azithromycin ), prednisone  20 mg (increased since hospitalization in August), DuoNeb as needed, fluticasone  nasal spray.  He was hospitalized with exacerbation in August for hernia repair. He is experiencing exertional SOB. He is unsure whether his POC is always triggering appropriately - he had [employer] look at it, seems to be working. He was considering changing to [employer] but he would need to have current DME for 5 yrs. He has started coughing up green mucous  CT-PA 07/29/2023 reviewed by me showed no evidence of pulmonary embolism, stable bilateral upper lobe pulmonary nodules compared with 07/08/23, improving nodular airspace disease in the superior segment of the right lower lobe consistent with an inflammatory or infectious process, stable chronic airspace opacities at the right base                                                                                                                                            Review of Systems  Constitutional:  Negative for fever and unexpected weight change.  HENT:  Positive for congestion. Negative for dental problem, ear pain, nosebleeds, postnasal drip, rhinorrhea, sinus pressure, sneezing, sore throat and trouble swallowing.   Eyes:  Negative for redness and itching.  Respiratory:   Positive for cough and shortness of breath. Negative for chest tightness and wheezing.   Cardiovascular:  Positive for palpitations. Negative for leg swelling.  Gastrointestinal:  Negative for nausea and vomiting.  Genitourinary:  Negative for dysuria.  Musculoskeletal:  Negative for joint swelling.  Skin:  Negative for rash.  Neurological:  Negative for headaches.  Hematological:  Does not bruise/bleed easily.  Psychiatric/Behavioral:  Positive for dysphoric mood. The patient is nervous/anxious.        Objective:   Physical Exam  Vitals:   12/03/23 1527  BP: 114/78  Pulse: 65  Temp: 97.8 F (36.6 C)  TempSrc: Oral  SpO2: 90%  Weight: 152 lb 12.8 oz (69.3 kg)  Height: 5' 9 (1.753 m)    Gen: Pleasant, well-nourished, in no distress, depressed affect  ENT: No lesions,  mouth clear,  oropharynx clear, no postnasal drip, evolving some cushingoid facies  Neck: No JVD, no stridor  Lungs: No use of accessory muscles, very distant, overall clear, some mild wheeze on forced expiration  Cardiovascular: RRR, heart sounds  normal, no murmur or gallops, trace peripheral edema  Musculoskeletal: no deformities   Neuro: alert, non focal  Skin: Warm, no lesions or rashes     Assessment & Plan:  Pulmonary nodules Most recent CT chest was done in June and showed that his bilateral upper lobe nodules were stable in size and appearance, no evidence of new nodules or recurrence of disease.  We will plan to repeat his CT scan of the chest in December, ordered today.  Chronic respiratory failure with hypoxia (HCC) He is having some difficulty with his POC, sometimes feels like it is not firing appropriately.  He has reviewed this with [employer] and they believe it is working the way it should.  He considered changing to [employer] but he cannot change DME companies within a 5-year window.  We will continue to work with [employer] to try to optimize.  He will continue his oxygen  at all time as  ordered.  Allergic rhinitis Continue current regimen  COPD with emphysema (HCC) Very severe obstruction with carina bronchitic symptoms.  He has had more green mucus and cough this month.  He is still on his rotating antibiotics.  I will have him start the next antibiotic cycle now (just a few days early).  He is currently on prednisone  20 mg every other day.  I will get him back to 10 mg once daily to see if we get better control of his daily symptoms especially wheezing  Continue your Trelegy once daily.  Rinse and gargle after using. Use your DuoNeb as needed for shortness of breath, wheezing, chest tightness We will change your prednisone  to 10 mg once daily Continue your rotating antibiotics as you have been taking them.  Go ahead and start your next antibiotic regimen now  Follow-up in our office in December 2026 after your CT chest so we can review those results together.   I personally spent a total of 41 minutes in the care of the patient today including preparing to see the patient, getting/reviewing separately obtained history, performing a medically appropriate exam/evaluation, counseling and educating, placing orders, documenting clinical information in the EHR, independently interpreting results, and communicating results.    Lamar Chris, MD, PhD 12/03/2023, 5:07 PM [facility] [phone] or if no answer [phone]

## 2023-12-03 NOTE — Telephone Encounter (Signed)
 Called  pt to schedule an appt w/Bianca - no answer; left message on vm to call the office.

## 2023-12-03 NOTE — Patient Instructions (Signed)
 We reviewed your CT scan of the chest from June.  This shows stable pulmonary nodules. We will repeat your CT scan of the chest in December 2025 Continue your Trelegy once daily.  Rinse and gargle after using. Use your DuoNeb as needed for shortness of breath, wheezing, chest tightness We will change your prednisone  to 10 mg once daily Continue your rotating antibiotics as you have been taking them.  Go ahead and start your next antibiotic regimen now Continue your oxygen  at 3 L/min Follow-up in our office in December 2026 after your CT chest so we can review those results together.

## 2023-12-03 NOTE — Assessment & Plan Note (Signed)
 Continue current regimen

## 2023-12-03 NOTE — Assessment & Plan Note (Signed)
 Most recent CT chest was done in June and showed that his bilateral upper lobe nodules were stable in size and appearance, no evidence of new nodules or recurrence of disease.  We will plan to repeat his CT scan of the chest in December, ordered today.

## 2023-12-09 ENCOUNTER — Telehealth: Admitting: *Deleted

## 2023-12-09 ENCOUNTER — Encounter: Payer: Self-pay | Admitting: *Deleted

## 2023-12-09 NOTE — Patient Instructions (Signed)
 [name] - I am sorry I was unable to reach you today for our scheduled appointment. I work with Nooruddin, Saad, MD and am calling to support your healthcare needs. Please contact me at [phone] at your earliest convenience. I look forward to speaking with you soon.   Thank you,   Olam Ku, RN, BSN [place]  [facility], [facility] RN Care Manager Direct Dial: [phone]  Fax: [phone]

## 2023-12-11 ENCOUNTER — Ambulatory Visit
Admission: RE | Admit: 2023-12-11 | Discharge: 2023-12-11 | Disposition: A | Discharge status: Home / Self Care | Source: Ambulatory Visit | Attending: Emergency Medicine | Admitting: Emergency Medicine

## 2023-12-11 DIAGNOSIS — C3490 Malignant neoplasm of unspecified part of unspecified bronchus or lung: Secondary | ICD-10-CM

## 2023-12-11 DIAGNOSIS — R9389 Abnormal findings on diagnostic imaging of other specified body structures: Secondary | ICD-10-CM

## 2023-12-11 DIAGNOSIS — R918 Other nonspecific abnormal finding of lung field: Secondary | ICD-10-CM

## 2023-12-14 ENCOUNTER — Other Ambulatory Visit: Payer: Self-pay | Admitting: *Deleted

## 2023-12-14 NOTE — Patient Instructions (Signed)
 Visit Information  Thank you for taking time to visit with me today. Please don't hesitate to contact me if I can be of assistance to you before our next scheduled appointment.  Your next care management appointment is by telephone on 01/14/2024 at 2:00 pm  Please call the care guide team at [phone] if you need to cancel, schedule, or reschedule an appointment.   Please call the Suicide and Crisis Lifeline: 988 call the [place]  National Suicide Prevention Lifeline: (684)660-3254 or TTY: 609-709-2781 TTY 6264383890) to talk to a trained counselor call 1-800-273-TALK (toll free, 24 hour hotline) if you are experiencing a Mental Health or Behavioral Health Crisis or need someone to talk to.  Olam Ku, RN, BSN [place]  [facility], [facility] RN Care Manager Direct Dial: [phone]  Fax: [phone]
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
## 2023-12-15 ENCOUNTER — Other Ambulatory Visit: Payer: Self-pay | Admitting: Gastroenterology

## 2023-12-25 ENCOUNTER — Encounter: Payer: Self-pay | Admitting: Gastroenterology

## 2023-12-25 ENCOUNTER — Ambulatory Visit: Admitting: Gastroenterology

## 2023-12-25 VITALS — BP 118/72 | HR 57 | Ht 69.0 in | Wt 152.0 lb

## 2023-12-25 DIAGNOSIS — K219 Gastro-esophageal reflux disease without esophagitis: Secondary | ICD-10-CM

## 2023-12-25 DIAGNOSIS — T402X5A Adverse effect of other opioids, initial encounter: Secondary | ICD-10-CM | POA: Diagnosis not present

## 2023-12-25 DIAGNOSIS — K529 Noninfective gastroenteritis and colitis, unspecified: Secondary | ICD-10-CM | POA: Diagnosis not present

## 2023-12-25 DIAGNOSIS — K5903 Drug induced constipation: Secondary | ICD-10-CM

## 2023-12-25 DIAGNOSIS — R1084 Generalized abdominal pain: Secondary | ICD-10-CM

## 2023-12-25 NOTE — Progress Notes (Signed)
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 08/31/2023 1504   ALKPHOS 118 08/31/2023 1504   BILITOT 0.4 08/31/2023 1504   GFRNONAA >60 10/02/2023 1541   GFRNONAA >60 08/31/2023 1504   GFRAA >60 08/05/2019 2001   Echocardiogram 03/05/2021 1. Left ventricular ejection fraction, by estimation, is 55 to 60% . Left ventricular  ejection fraction by 3D volume is 57 % . The left ventricle has normal function. The left ventricle has no regional wall motion abnormalities. Left ventricular diastolic parameters are consistent with Grade I diastolic dysfunction ( impaired relaxation) . 2. Right ventricular systolic function is normal. The right ventricular size is normal. Tricuspid regurgitation signal is inadequate for assessing PA pressure.  3. The mitral valve is normal in structure. Trivial mitral valve regurgitation. No evidence of mitral stenosis.  4. The aortic valve is normal in structure. Aortic valve regurgitation is not visualized. No aortic stenosis is present.  5. The inferior vena cava is dilated in size with > 50% respiratory variability, suggesting right atrial pressure of 8 mmHg.  Assessment/Plan:   Assessment & Plan Chronic colitis with chronic abdominal pain and chronic nausea Chronic abdominal pain and nausea, pain somewhat improved with Apriso  but persistent.  Elevated fecal calprotectin indicates inflammation. Constipation may contribute to pain, does notice improvement in pain after bowel movement sometimes.  On chronic opioids, some improvement with MiraLAX .  Has not really tried taking Movantik .  Discussed this today.  As he is taking Percocet daily he may benefit from regular use of Movantik  as well.  Discussed that he can take MiraLAX  in addition to this if needed but to discontinue if he has diarrhea.   Remains hesitant to undergo colonoscopy.  Discussed that we cannot escalate his colitis treatment without repeat endoscopic evaluation.  Will plan to repeat a fecal calprotectin in a couple months prior to his follow-up with Dr. Wilhelmenia to see if there is any improvement on Apriso .  - Continue Apriso  1.5 g daily. - Repeat fecal calprotectin in a couple of months. - Can try Movantik  for management of likely opioid-induced constipation, MiraLAX  as needed - Monitor for worsening symptoms. - Follow up with Dr. Wilhelmenia 2-3 months, repeat fecal calprotectin prior to that appointment  Opioid-induced constipation Constipation improved with Miralax  and Movantik . Constipation may contribute to abdominal pain.  - Continue Miralax  and Movantik . - Adjust Miralax  dosage if diarrhea occurs.  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 On long-term prednisone  and antibiotics.    Camie Furbish, PA-C [facility] 12/25/2023, 4:32 PM  Patient Care Team: Nooruddin, Saad, MD as PCP - General (Internal Medicine) Ladona Kaspar Albornoz, MD as PCP - Cardiology (Cardiology) Sherrod Sherrod, MD (Hematology and Oncology) Jori Small, FNP as Referring Physician (Nurse Practitioner) Prentis Duwaine BROCKS, RN as Oncology Nurse Navigator Alvia Olam BIRCH, RN as [facility] Care Management

## 2023-12-25 NOTE — Patient Instructions (Signed)
 I will call to remind you to return you fecal calprotectin stool test right before you follow up with Dr. Wilhelmenia on 02/24/24.  _______________________________________________________  If your blood pressure at your visit was 140/90 or greater, please contact your primary care physician to follow up on this.  _______________________________________________________  If you are age 68 or older, your body mass index should be between 23-30. Your Body mass index is 22.45 kg/m. If this is out of the aforementioned range listed, please consider follow up with your Primary Care Provider.  If you are age 60 or younger, your body mass index should be between 19-25. Your Body mass index is 22.45 kg/m. If this is out of the aformentioned range listed, please consider follow up with your Primary Care Provider.   ________________________________________________________  The [facility] providers would like to encourage you to use MYCHART to communicate with providers for non-urgent requests or questions.  Due to long hold times on the telephone, sending your provider a message by [facility] may be a faster and more efficient way to get a response.  Please allow 48 business hours for a response.  Please remember that this is for non-urgent requests.  _______________________________________________________  [facility] is using a team-based approach to care.  Your team is made up of your doctor and two to three APPS. Our APPS (Nurse Practitioners and Physician Assistants) work with your physician to ensure care continuity for you. They are fully qualified to address your health concerns and develop a treatment plan. They communicate directly with your gastroenterologist to care for you. Seeing the Advanced Practice Practitioners on your physician's team can help you by facilitating care more promptly, often allowing for earlier appointments, access to diagnostic testing, procedures, and other  specialty referrals.

## 2023-12-27 NOTE — Progress Notes (Signed)
 Attending Physician's Attestation   I have reviewed the chart.   I agree with the Advanced Practitioner's note, impression, and recommendations with any updates as below.    Corliss Parish, MD [facility] Advanced Endoscopy Office # [phone]

## 2023-12-29 ENCOUNTER — Other Ambulatory Visit: Payer: Self-pay

## 2023-12-29 ENCOUNTER — Other Ambulatory Visit (HOSPITAL_BASED_OUTPATIENT_CLINIC_OR_DEPARTMENT_OTHER): Payer: Self-pay

## 2023-12-29 DIAGNOSIS — C3490 Malignant neoplasm of unspecified part of unspecified bronchus or lung: Secondary | ICD-10-CM

## 2023-12-31 IMAGING — DX DG CHEST 2V
2 series · 2 of 2 positions shown · non-contrast
Comparison: Chest radiograph 01/15/2021

CLINICAL DATA: COPD flare

EXAM:
CHEST - 2 VIEW

[chest pa]
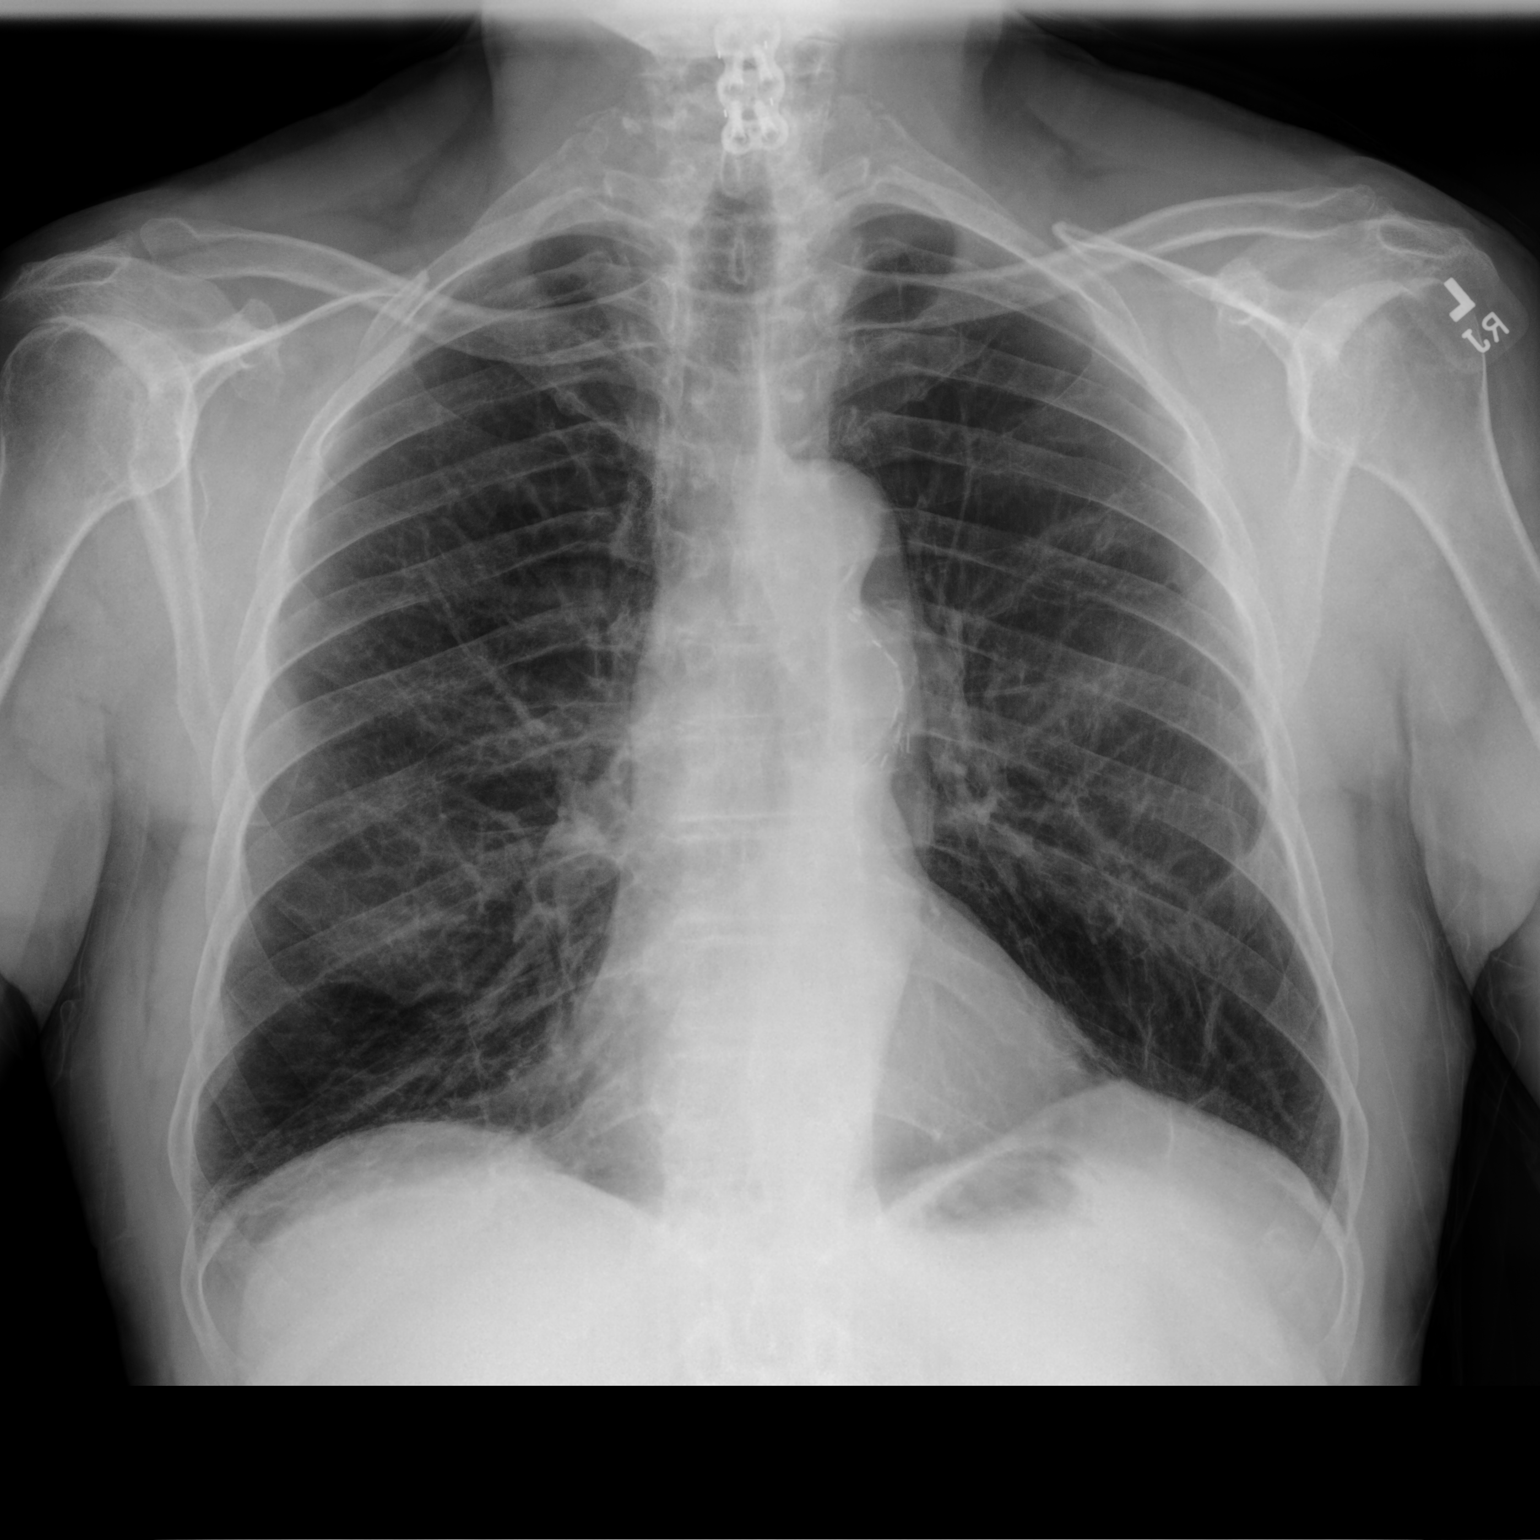

[chest lat]
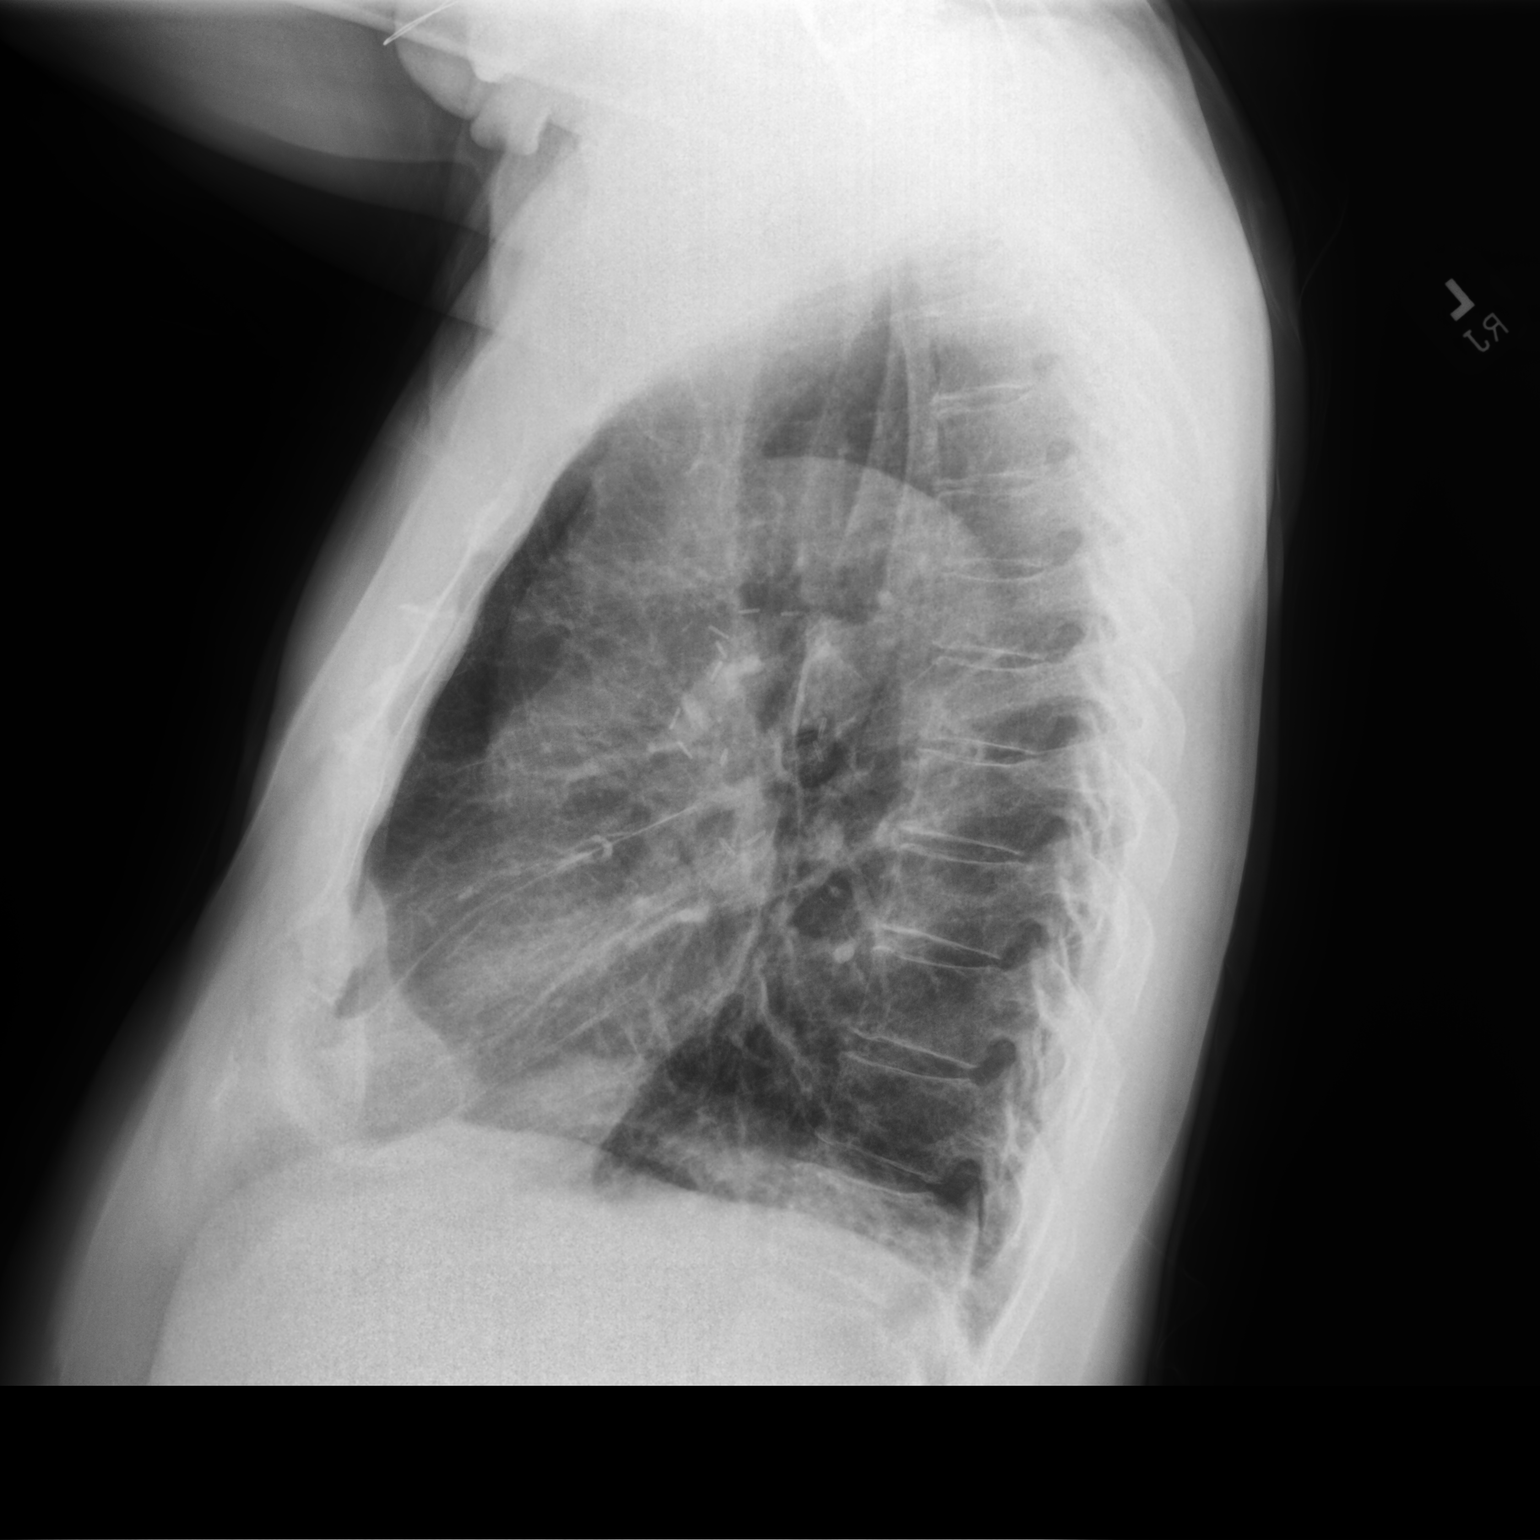

[2 of 2 positions shown; findings below may reference images not displayed]

FINDINGS: Unchanged cardiomediastinal silhouette. There is no focal airspace
consolidation. There is no pleural effusion. No pneumothorax.
Pulmonary hyperinflation. No acute osseous abnormality. Cervical
spine fusion hardware noted. Thoracic spondylosis.
IMPRESSION: COPD.  No new airspace disease.

## 2024-01-01 ENCOUNTER — Other Ambulatory Visit

## 2024-01-01 ENCOUNTER — Inpatient Hospital Stay: Attending: Internal Medicine

## 2024-01-01 DIAGNOSIS — Z85118 Personal history of other malignant neoplasm of bronchus and lung: Secondary | ICD-10-CM | POA: Diagnosis present

## 2024-01-01 DIAGNOSIS — C3490 Malignant neoplasm of unspecified part of unspecified bronchus or lung: Secondary | ICD-10-CM

## 2024-01-01 DIAGNOSIS — Z85528 Personal history of other malignant neoplasm of kidney: Secondary | ICD-10-CM | POA: Diagnosis not present

## 2024-01-01 LAB — CMP (CANCER CENTER ONLY)
ALT: 11 U/L (ref 0–44)
AST: 19 U/L (ref 15–41)
Albumin: 4.2 g/dL (ref 3.5–5.0)
Alkaline Phosphatase: 132 U/L — ABNORMAL HIGH (ref 38–126)
Anion gap: 11 (ref 5–15)
BUN: 12 mg/dL (ref 8–23)
CO2: 26 mmol/L (ref 22–32)
Calcium: 9.5 mg/dL (ref 8.9–10.3)
Chloride: 102 mmol/L (ref 98–111)
Creatinine: 1.16 mg/dL (ref 0.61–1.24)
GFR, Estimated: >60 mL/min (ref >60)
Glucose, Bld: 102 mg/dL — ABNORMAL HIGH (ref 70–99)
Potassium: 3.9 mmol/L (ref 3.5–5.1)
Sodium: 139 mmol/L (ref 135–145)
Total Bilirubin: 0.8 mg/dL (ref 0.0–1.2)
Total Protein: 7.6 g/dL (ref 6.5–8.1)

## 2024-01-01 LAB — CBC WITH DIFFERENTIAL (CANCER CENTER ONLY)
Abs Immature Granulocytes: 0.13 K/uL — ABNORMAL HIGH (ref 0.00–0.07)
Basophils Absolute: 0.1 K/uL (ref 0.0–0.1)
Basophils Relative: 1 %
Eosinophils Absolute: 0.4 K/uL (ref 0.0–0.5)
Eosinophils Relative: 4 %
HCT: 45.7 % (ref 39.0–52.0)
Hemoglobin: 14.7 g/dL (ref 13.0–17.0)
Immature Granulocytes: 1 %
Lymphocytes Relative: 11 %
Lymphs Abs: 1.1 K/uL (ref 0.7–4.0)
MCH: 29.4 pg (ref 26.0–34.0)
MCHC: 32.2 g/dL (ref 30.0–36.0)
MCV: 91.4 fL (ref 80.0–100.0)
Monocytes Absolute: 1 K/uL (ref 0.1–1.0)
Monocytes Relative: 9 %
Neutro Abs: 7.8 K/uL — ABNORMAL HIGH (ref 1.7–7.7)
Neutrophils Relative %: 74 %
Platelet Count: 262 K/uL (ref 150–400)
RBC: 5 MIL/uL (ref 4.22–5.81)
RDW: 15.2 % (ref 11.5–15.5)
WBC Count: 10.5 K/uL (ref 4.0–10.5)
nRBC: 0 % (ref 0.0–0.2)

## 2024-01-07 ENCOUNTER — Inpatient Hospital Stay: Admitting: Internal Medicine

## 2024-01-07 VITALS — BP 124/74 | HR 60 | Temp 98.2°F | Resp 17 | Ht 69.0 in | Wt 152.0 lb

## 2024-01-07 DIAGNOSIS — Z85528 Personal history of other malignant neoplasm of kidney: Secondary | ICD-10-CM | POA: Insufficient documentation

## 2024-01-07 DIAGNOSIS — R918 Other nonspecific abnormal finding of lung field: Secondary | ICD-10-CM | POA: Diagnosis not present

## 2024-01-07 DIAGNOSIS — R0689 Other abnormalities of breathing: Secondary | ICD-10-CM | POA: Diagnosis not present

## 2024-01-07 DIAGNOSIS — J8410 Pulmonary fibrosis, unspecified: Secondary | ICD-10-CM | POA: Diagnosis not present

## 2024-01-07 DIAGNOSIS — Z923 Personal history of irradiation: Secondary | ICD-10-CM | POA: Insufficient documentation

## 2024-01-07 DIAGNOSIS — Z9981 Dependence on supplemental oxygen: Secondary | ICD-10-CM | POA: Diagnosis not present

## 2024-01-07 DIAGNOSIS — R143 Flatulence: Secondary | ICD-10-CM | POA: Diagnosis not present

## 2024-01-07 DIAGNOSIS — C3490 Malignant neoplasm of unspecified part of unspecified bronchus or lung: Secondary | ICD-10-CM | POA: Diagnosis not present

## 2024-01-07 DIAGNOSIS — Z902 Acquired absence of lung [part of]: Secondary | ICD-10-CM | POA: Diagnosis not present

## 2024-01-07 DIAGNOSIS — R140 Abdominal distension (gaseous): Secondary | ICD-10-CM | POA: Diagnosis not present

## 2024-01-07 DIAGNOSIS — Z85118 Personal history of other malignant neoplasm of bronchus and lung: Secondary | ICD-10-CM | POA: Diagnosis present

## 2024-01-07 DIAGNOSIS — Z905 Acquired absence of kidney: Secondary | ICD-10-CM | POA: Diagnosis not present

## 2024-01-07 DIAGNOSIS — K5903 Drug induced constipation: Secondary | ICD-10-CM | POA: Diagnosis not present
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
## 2024-01-12 ENCOUNTER — Other Ambulatory Visit: Payer: Self-pay | Admitting: Emergency Medicine

## 2024-01-14 ENCOUNTER — Telehealth: Payer: Self-pay | Admitting: *Deleted

## 2024-01-14 ENCOUNTER — Encounter: Payer: Self-pay | Admitting: Cardiology

## 2024-01-14 ENCOUNTER — Encounter: Payer: Self-pay | Admitting: *Deleted

## 2024-01-14 NOTE — Patient Instructions (Signed)
 [name] - I am sorry I was unable to reach you today for our scheduled appointment. I work with Nooruddin, Saad, MD and am calling to support your healthcare needs. Please contact me at [phone] at your earliest convenience. I look forward to speaking with you soon.   Thank you,   Olam Ku, RN, BSN [place]  [facility], [facility] RN Care Manager Direct Dial: [phone]  Fax: [phone]

## 2024-01-19 ENCOUNTER — Telehealth: Payer: Self-pay | Admitting: *Deleted

## 2024-01-19 ENCOUNTER — Encounter: Payer: Self-pay | Admitting: *Deleted

## 2024-01-19 NOTE — Patient Instructions (Signed)
 [name] - I am sorry I was unable to reach you today for our scheduled appointment. I work with Nooruddin, Saad, MD and am calling to support your healthcare needs. Please contact me at [phone] at your earliest convenience. I look forward to speaking with you soon.   Thank you,   Olam Ku, RN, BSN [place]  [facility], [facility] RN Care Manager Direct Dial: [phone]  Fax: [phone]

## 2024-01-27 ENCOUNTER — Telehealth: Payer: Self-pay | Admitting: *Deleted

## 2024-01-27 ENCOUNTER — Encounter: Payer: Self-pay | Admitting: *Deleted

## 2024-01-27 NOTE — Patient Outreach (Signed)
 Complex Care Management   Visit Note  01/27/2024  Name:  [name] MRN: [id number] DOB: 08/18/1955   Follow Up Plan:   Closing From:  Complex Care Management Unable to make contact with this pt with several outreach calls.   Olam Ku, RN, BSN [place]  [facility], [facility] RN Care Manager Direct Dial: [phone]  Fax: [phone]

## 2024-01-27 NOTE — Patient Instructions (Signed)
 [name] - I have attempted to call you three times but have been unsuccessful in reaching you. I work with Nooruddin, Saad, MD and am calling to support your healthcare needs. If I can be of assistance to you, please contact me at [phone].     Thank you,   Olam Ku, RN, BSN [place]  [facility], [facility] RN Care Manager Direct Dial: [phone]  Fax: [phone]

## 2024-01-29 ENCOUNTER — Other Ambulatory Visit: Payer: Self-pay | Admitting: Pulmonary Disease

## 2024-02-02 ENCOUNTER — Other Ambulatory Visit: Payer: Self-pay | Admitting: Student

## 2024-02-02 DIAGNOSIS — R1084 Generalized abdominal pain: Secondary | ICD-10-CM

## 2024-02-02 DIAGNOSIS — R110 Nausea: Secondary | ICD-10-CM

## 2024-02-02 NOTE — Telephone Encounter (Signed)
 Medication sent to pharmacy

## 2024-02-11 ENCOUNTER — Encounter: Payer: Self-pay | Admitting: Emergency Medicine

## 2024-02-11 ENCOUNTER — Ambulatory Visit (INDEPENDENT_AMBULATORY_CARE_PROVIDER_SITE_OTHER): Admitting: Emergency Medicine

## 2024-02-11 VITALS — BP 115/74 | HR 57 | Ht 69.0 in | Wt 153.2 lb

## 2024-02-11 DIAGNOSIS — K219 Gastro-esophageal reflux disease without esophagitis: Secondary | ICD-10-CM | POA: Diagnosis not present

## 2024-02-11 DIAGNOSIS — R918 Other nonspecific abnormal finding of lung field: Secondary | ICD-10-CM | POA: Diagnosis not present

## 2024-02-11 DIAGNOSIS — J309 Allergic rhinitis, unspecified: Secondary | ICD-10-CM | POA: Diagnosis not present

## 2024-02-11 DIAGNOSIS — J301 Allergic rhinitis due to pollen: Secondary | ICD-10-CM

## 2024-02-11 DIAGNOSIS — J439 Emphysema, unspecified: Secondary | ICD-10-CM

## 2024-02-11 MED ORDER — IPRATROPIUM-ALBUTEROL 0.5-2.5 (3) MG/3ML IN SOLN: 3.0000 mL | Freq: Four times a day (QID) | RESPIRATORY_TRACT | 3 refills | Status: AC | PRN

## 2024-02-11 MED ORDER — IPRATROPIUM BROMIDE 0.03 % NA SOLN: 2.0000 | Freq: Two times a day (BID) | NASAL | 12 refills | Status: DC

## 2024-02-11 NOTE — Patient Instructions (Addendum)
 Please continue your Trelegy once daily.  Rinse and gargle after using. Continue your DuoNeb up to every 6 hours if needed for shortness of breath, chest tightness, wheezing. We will work on getting you a new nebulizer machine since your current machine is broken. Continue prednisone  10 mg once daily. Continue your rotating antibiotics as you have been using them. Continue fluticasone  nasal spray 2 sprays each nostril once daily. Start using ipratropium (Atrovent ) nasal spray, 2 sprays each nostril up to 3 times daily to help with nasal congestion and drainage Continue your omeprazole  as you have been taking it Continue your oxygen  at 2 L/min.  Our goal is to keep your oxygen  saturations > 90%.  You can uptitrate your oxygen  in order to stay above 90%. We reviewed your CT scan of the chest from November 2025. Get your repeat CT scan of the chest as planned by Dr. Sherrod in the spring 2026. Follow-up in our office in 3 months.  Please call if you have any problems so we can see you sooner.

## 2024-02-11 NOTE — Assessment & Plan Note (Signed)
 He has waxing and waning pulmonary nodular disease.  The nodules overall have been stable.  None has grown on serial imaging to suggest a recurrence of lung cancer.  Could consider bronchoscopy and BAL for culture data to help guide therapy for his cough, although he is very high risk for any procedure.  I do not think he would tolerate bronchoscopy well.  Could consider attempting sputum for AFB

## 2024-02-11 NOTE — Assessment & Plan Note (Signed)
 He has wheezing but it sounds like a lot of what he is dealing with is significant rhinitis, rhinorrhea with upper airway noise especially in the morning.  Will add ipratropium nasal spray to his fluticasone  nasal spray to see if this helps.  We talked about the impact oxygen  on his rhinorrhea -it does get a little bit better when he can have breaks from the O2.

## 2024-02-11 NOTE — Progress Notes (Signed)
 "  Subjective:    Patient ID: [name], male    DOB: 21-Dec-1955, 69 y.o.   MRN: [id number]  COPD He complains of cough and shortness of breath. There is no wheezing. Pertinent negatives include no ear pain, fever, headaches, postnasal drip, rhinorrhea, sneezing, sore throat or trouble swallowing. His past medical history is significant for COPD.   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 Hx resected renal cell CA.  He has had waxing and waning pulmonary nodules that we have followed with serial chest imaging.  Managed on Trelegy, rotating antibiotics, prednisone  which I decreased back to 10 mg daily at his last visit (from 20 mg during recent hospitalization).  Uses DuoNeb as needed, Fluticasone  nasal spray.  On omeprazole  . He is on oxygen  at 2L/min.  He feels overall worse. Hears wheeze especially in the am - especially in his throat, has increased exertional SOB. More mucous especially in the am. He has tried mucinex . He is worried that his neb machine is not working properly - it is not that old, about a year.  He needs a new one  CT chest 12/11/2023 reviewed by me shows severe emphysema, overall similar bilateral nodularity although there are some new findings including anterior left lower lobe 2.1 cm, right basilar nodule and an area of previous heterogeneous airspace disease 1.8 cm.                                                                                                                                       Review of Systems  Constitutional:  Negative for fever and unexpected weight change.  HENT:  Positive for congestion. Negative for dental problem, ear pain, nosebleeds, postnasal drip, rhinorrhea, sinus pressure, sneezing, sore throat and trouble swallowing.   Eyes:  Negative for redness and itching.   Respiratory:  Positive for cough and shortness of breath. Negative for chest tightness and wheezing.   Cardiovascular:  Positive for palpitations. Negative for leg swelling.  Gastrointestinal:  Negative for nausea and vomiting.  Genitourinary:  Negative for dysuria.  Musculoskeletal:  Negative for joint swelling.  Skin:  Negative for rash.  Neurological:  Negative for headaches.  Hematological:  Does not bruise/bleed easily.  Psychiatric/Behavioral:  Positive for dysphoric mood. The patient is nervous/anxious.        Objective:   Physical Exam  Vitals:   02/11/24 1555  BP: 115/74  Pulse: (!) 57  Weight: 153 lb 3.2 oz (69.5 kg)  Height: 5' 9 (1.753 m)  PF: 93 L/min    Gen: Pleasant, well-nourished, in no distress, depressed affect  ENT: No lesions,  mouth clear,  oropharynx clear, no postnasal drip, evolving some cushingoid facies  Neck: No JVD, no stridor  Lungs: No use of accessory muscles, very distant, overall clear, some  mild wheeze on forced expiration  Cardiovascular: RRR, heart sounds normal, no murmur or gallops, trace peripheral edema  Musculoskeletal: no deformities   Neuro: alert, non focal  Skin: Warm, no lesions or rashes     Assessment & Plan:  COPD with emphysema (HCC) Please continue your Trelegy once daily.  Rinse and gargle after using. Continue your DuoNeb up to every 6 hours if needed for shortness of breath, chest tightness, wheezing. We will work on getting you a new nebulizer machine since your current machine is broken. Continue prednisone  10 mg once daily. Continue your rotating antibiotics as you have been using them. Follow-up in our office in 3 months.  Please call if you have any problems so we can see you sooner.  Allergic rhinitis He has wheezing but it sounds like a lot of what he is dealing with is significant rhinitis, rhinorrhea with upper airway noise especially in the morning.  Will add ipratropium nasal spray to his  fluticasone  nasal spray to see if this helps.  We talked about the impact oxygen  on his rhinorrhea -it does get a little bit better when he can have breaks from the O2.  GERD (gastroesophageal reflux disease) Continue PPI as ordered  Pulmonary nodules He has waxing and waning pulmonary nodular disease.  The nodules overall have been stable.  None has grown on serial imaging to suggest a recurrence of lung cancer.  Could consider bronchoscopy and BAL for culture data to help guide therapy for his cough, although he is very high risk for any procedure.  I do not think he would tolerate bronchoscopy well.  Could consider attempting sputum for AFB    I personally spent a total of 64 minutes in the care of the patient today including preparing to see the patient, getting/reviewing separately obtained history, performing a medically appropriate exam/evaluation, counseling and educating, placing orders, documenting clinical information in the EHR, independently interpreting results, and communicating results.    Lamar Chris, MD, PhD 02/11/2024, 4:56 PM [facility] [phone] or if no answer [phone]  "

## 2024-02-11 NOTE — Assessment & Plan Note (Signed)
 Please continue your Trelegy once daily.  Rinse and gargle after using. Continue your DuoNeb up to every 6 hours if needed for shortness of breath, chest tightness, wheezing. We will work on getting you a new nebulizer machine since your current machine is broken. Continue prednisone  10 mg once daily. Continue your rotating antibiotics as you have been using them. Follow-up in our office in 3 months.  Please call if you have any problems so we can see you sooner.

## 2024-02-11 NOTE — Assessment & Plan Note (Signed)
Continue PPI as ordered

## 2024-02-15 ENCOUNTER — Ambulatory Visit: Payer: Self-pay | Admitting: Emergency Medicine

## 2024-02-15 ENCOUNTER — Other Ambulatory Visit: Payer: Self-pay

## 2024-02-15 MED ORDER — PREDNISONE 10 MG PO TABS: 10.0000 mg | ORAL_TABLET | Freq: Every day | ORAL | 2 refills | Status: AC

## 2024-02-15 MED ORDER — IPRATROPIUM BROMIDE 0.03 % NA SOLN: 2.0000 | Freq: Two times a day (BID) | NASAL | 12 refills | Status: DC

## 2024-02-15 NOTE — Telephone Encounter (Signed)
 FYI Only or Action Required?: Action required by provider: medication requests.  Patient is followed in Pulmonology, last seen on 02/11/2024 by Shelah Lamar RAMAN, MD.  Called Nurse Triage reporting Medication Refill.  Triage Disposition: Call PCP Now  Patient/caregiver understands and will follow disposition?: Yes                      Copied from CRM #[id number]. Topic: Clinical - Red Word Triage >> Feb 15, 2024  9:33 AM Leila BROCKS wrote: Red Word that prompted transfer to Nurse Triage: Patient [phone] states saw Dr. Shelah 02/11/24 and prescriptions was not sent: nasal spray, antibiotics, Prednisone , and the only prescriptions called in was nebulizers. Patient states is having wheezing, shortness of breath- on oxygen , and mucous caught in his throat. Patient denies pain, fever, nor dizziness. Please advise.   [facility] - [place] ([place]), [place] - [address] [place] ([place]) [place] [address] Phone: [phone] Fax: [phone]   ----------------------------------------------------------------------- From previous Reason for Contact - Prescription Issue: Reason for CRM: Reason for Disposition  [1] Prescription not at pharmacy AND [2] was prescribed by doctor (or NP/PA) recently  (Exception: [facility] has access to EMR and prescription is recorded there. Go to Home Care and confirm prescription for pharmacy.)  Answer Assessment - Initial Assessment Questions Pt saw Dr. Fayetta in office on 1/8 and states the only medication at the pharmacy was nebulizer. Pt is calling to get the rest of his medications sent.   Pt was not sent an antibiotic, prednisone , nasal spray (not at [facility] when he went, although now shows it was sent and pt made aware) No worsening symptoms since office visit on 1/8 Pt on 2 L of O2 Not sure o2 sat right now  Pharmacy [facility] - [place] ([place]), [place] - [address], [place] ([place]) [place] [address] Phone: [phone]  Fax: [phone]  Protocols used: Medication Refill and Renewal Call-A-AH

## 2024-02-15 NOTE — Telephone Encounter (Signed)
 Rx have been sent to pharmacy

## 2024-02-16 ENCOUNTER — Other Ambulatory Visit: Payer: Self-pay

## 2024-02-16 MED ORDER — AZITHROMYCIN 250 MG PO TABS: 250.0000 mg | ORAL_TABLET | Freq: Every day | ORAL | 0 refills | Status: AC

## 2024-02-16 MED ORDER — CEFUROXIME AXETIL 250 MG PO TABS: 250.0000 mg | ORAL_TABLET | Freq: Two times a day (BID) | ORAL | 0 refills | Status: AC

## 2024-02-16 MED ORDER — DOXYCYCLINE HYCLATE 100 MG PO TABS: 100.0000 mg | ORAL_TABLET | Freq: Two times a day (BID) | ORAL | 0 refills | Status: AC

## 2024-02-16 NOTE — Telephone Encounter (Signed)
 He has been on rotating cefuroxime , doxycycline , azithromycin . I was unaware that he needed refills, but ok to refill as had been written. He is to take regimen for one week of rotating months

## 2024-02-16 NOTE — Telephone Encounter (Signed)
 Thank you so much

## 2024-02-24 ENCOUNTER — Ambulatory Visit: Admitting: Gastroenterology

## 2024-03-02 ENCOUNTER — Other Ambulatory Visit: Payer: Self-pay

## 2024-03-02 MED ORDER — IPRATROPIUM BROMIDE 0.03 % NA SOLN: 2.0000 | Freq: Two times a day (BID) | NASAL | 1 refills | Status: AC

## 2024-03-02 NOTE — Telephone Encounter (Signed)
 Copied from CRM [id number]. Topic: Clinical - Prescription Issue >> Mar 02, 2024  4:24 PM Leila C wrote: Reason for CRM: Patient wants to get a nasal spray Dr. Shelah recommended. Patient put the phone call on speaker to have [facility] pharmacist speak: the pharmacy advised patient cannot get refill on 03/10/24 and needs a new prescription ipratropium (ATROVENT ) 0.03 % nasal spray two sprays for three times daily, the old prescription is two spray twice daily and has ran out of medication. Per CAL, CMA will send the refill today. Patient verbalized understanding and denies any other concerns.   [facility] - [place] ([place]), [place] - [address] [place] ([place]) [place] [address] Phone: [phone] Fax: [phone]    Rx already sent, NFN

## 2024-03-09 ENCOUNTER — Ambulatory Visit: Payer: Self-pay

## 2024-03-17 ENCOUNTER — Ambulatory Visit: Admitting: Gastroenterology

## 2024-06-22 ENCOUNTER — Ambulatory Visit: Admitting: Emergency Medicine
